# Patient Record
Sex: Female | Born: 1935
Health system: Southern US, Community
[De-identification: ages and names within clinical notes are randomized; demographics above are authoritative.]

## PROBLEM LIST (undated history)

## (undated) DIAGNOSIS — E539 Vitamin B deficiency, unspecified: Secondary | ICD-10-CM

## (undated) DIAGNOSIS — R262 Difficulty in walking, not elsewhere classified: Secondary | ICD-10-CM

## (undated) DIAGNOSIS — N390 Urinary tract infection, site not specified: Secondary | ICD-10-CM

## (undated) DIAGNOSIS — R41841 Cognitive communication deficit: Secondary | ICD-10-CM

## (undated) DIAGNOSIS — C50919 Malignant neoplasm of unspecified site of unspecified female breast: Secondary | ICD-10-CM

## (undated) DIAGNOSIS — E079 Disorder of thyroid, unspecified: Secondary | ICD-10-CM

## (undated) DIAGNOSIS — R21 Rash and other nonspecific skin eruption: Secondary | ICD-10-CM

## (undated) DIAGNOSIS — F0390 Unspecified dementia without behavioral disturbance: Secondary | ICD-10-CM

## (undated) DIAGNOSIS — W19XXXA Unspecified fall, initial encounter: Secondary | ICD-10-CM

## (undated) DIAGNOSIS — M6281 Muscle weakness (generalized): Secondary | ICD-10-CM

## (undated) HISTORY — DX: Difficulty in walking, not elsewhere classified: R26.2

## (undated) HISTORY — DX: Muscle weakness (generalized): M62.81

## (undated) HISTORY — PX: COLON SURGERY: SHX602

## (undated) HISTORY — DX: Cognitive communication deficit: R41.841

## (undated) HISTORY — DX: Urinary tract infection, site not specified: N39.0

## (undated) HISTORY — PX: MASTECTOMY, PARTIAL: SHX709

---

## 2001-08-19 ENCOUNTER — Ambulatory Visit (HOSPITAL_COMMUNITY): Admission: RE | Admit: 2001-08-19 | Discharge: 2001-08-19 | Payer: Self-pay | Admitting: Ophthalmology

## 2003-04-22 ENCOUNTER — Emergency Department (HOSPITAL_COMMUNITY): Admission: EM | Admit: 2003-04-22 | Discharge: 2003-04-22 | Payer: Self-pay | Admitting: Emergency Medicine

## 2004-10-05 ENCOUNTER — Ambulatory Visit (HOSPITAL_COMMUNITY): Admission: RE | Admit: 2004-10-05 | Discharge: 2004-10-05 | Payer: Self-pay | Admitting: Internal Medicine

## 2004-10-24 ENCOUNTER — Ambulatory Visit (HOSPITAL_COMMUNITY): Admission: RE | Admit: 2004-10-24 | Discharge: 2004-10-24 | Payer: Self-pay | Admitting: General Surgery

## 2004-10-25 ENCOUNTER — Inpatient Hospital Stay (HOSPITAL_COMMUNITY): Admission: RE | Admit: 2004-10-25 | Discharge: 2004-10-31 | Payer: Self-pay | Admitting: General Surgery

## 2005-10-09 ENCOUNTER — Ambulatory Visit (HOSPITAL_COMMUNITY): Admission: RE | Admit: 2005-10-09 | Discharge: 2005-10-09 | Payer: Self-pay | Admitting: Internal Medicine

## 2006-12-16 ENCOUNTER — Ambulatory Visit (HOSPITAL_COMMUNITY): Admission: RE | Admit: 2006-12-16 | Discharge: 2006-12-16 | Payer: Self-pay | Admitting: Internal Medicine

## 2006-12-27 ENCOUNTER — Encounter: Admission: RE | Admit: 2006-12-27 | Discharge: 2006-12-27 | Payer: Self-pay | Admitting: Internal Medicine

## 2007-01-03 ENCOUNTER — Encounter: Admission: RE | Admit: 2007-01-03 | Discharge: 2007-01-03 | Payer: Self-pay | Admitting: Internal Medicine

## 2007-01-03 ENCOUNTER — Encounter (INDEPENDENT_AMBULATORY_CARE_PROVIDER_SITE_OTHER): Payer: Self-pay | Admitting: Diagnostic Radiology

## 2007-01-03 ENCOUNTER — Encounter (INDEPENDENT_AMBULATORY_CARE_PROVIDER_SITE_OTHER): Payer: Self-pay | Admitting: Specialist

## 2007-01-17 ENCOUNTER — Encounter: Admission: RE | Admit: 2007-01-17 | Discharge: 2007-01-17 | Payer: Self-pay | Admitting: General Surgery

## 2007-01-27 ENCOUNTER — Inpatient Hospital Stay (HOSPITAL_COMMUNITY): Admission: RE | Admit: 2007-01-27 | Discharge: 2007-01-29 | Payer: Self-pay | Admitting: General Surgery

## 2007-01-27 ENCOUNTER — Encounter (INDEPENDENT_AMBULATORY_CARE_PROVIDER_SITE_OTHER): Payer: Self-pay | Admitting: *Deleted

## 2007-02-18 ENCOUNTER — Ambulatory Visit (HOSPITAL_COMMUNITY): Payer: Self-pay | Admitting: Oncology

## 2007-04-10 ENCOUNTER — Ambulatory Visit (HOSPITAL_COMMUNITY): Admission: RE | Admit: 2007-04-10 | Discharge: 2007-04-10 | Payer: Self-pay | Admitting: General Surgery

## 2007-11-26 ENCOUNTER — Encounter (HOSPITAL_COMMUNITY): Admission: RE | Admit: 2007-11-26 | Discharge: 2007-12-26 | Payer: Self-pay | Admitting: Oncology

## 2008-12-14 ENCOUNTER — Ambulatory Visit (HOSPITAL_COMMUNITY): Admission: RE | Admit: 2008-12-14 | Discharge: 2008-12-14 | Payer: Self-pay | Admitting: Obstetrics & Gynecology

## 2010-01-10 ENCOUNTER — Ambulatory Visit (HOSPITAL_COMMUNITY): Admission: RE | Admit: 2010-01-10 | Discharge: 2010-01-10 | Payer: Self-pay | Admitting: Internal Medicine

## 2010-04-27 ENCOUNTER — Other Ambulatory Visit: Admission: RE | Admit: 2010-04-27 | Discharge: 2010-04-27 | Payer: Self-pay | Admitting: Obstetrics & Gynecology

## 2011-02-20 ENCOUNTER — Other Ambulatory Visit (HOSPITAL_COMMUNITY): Payer: Self-pay | Admitting: Internal Medicine

## 2011-02-20 DIAGNOSIS — Z139 Encounter for screening, unspecified: Secondary | ICD-10-CM

## 2011-02-20 DIAGNOSIS — Z9010 Acquired absence of unspecified breast and nipple: Secondary | ICD-10-CM

## 2011-02-27 ENCOUNTER — Ambulatory Visit (HOSPITAL_COMMUNITY): Payer: Medicare Other

## 2011-03-01 ENCOUNTER — Ambulatory Visit (HOSPITAL_COMMUNITY): Payer: Medicare Other

## 2011-03-06 ENCOUNTER — Ambulatory Visit (HOSPITAL_COMMUNITY)
Admission: RE | Admit: 2011-03-06 | Discharge: 2011-03-06 | Disposition: A | Discharge status: Home / Self Care | Payer: Medicare Other | Source: Ambulatory Visit | Attending: Internal Medicine | Admitting: Internal Medicine

## 2011-03-06 DIAGNOSIS — Z139 Encounter for screening, unspecified: Secondary | ICD-10-CM

## 2011-03-06 DIAGNOSIS — Z9010 Acquired absence of unspecified breast and nipple: Secondary | ICD-10-CM

## 2011-03-06 DIAGNOSIS — Z1231 Encounter for screening mammogram for malignant neoplasm of breast: Secondary | ICD-10-CM | POA: Insufficient documentation

## 2011-03-06 NOTE — H&P (Signed)
NAME:  Cynthia [name], Cynthia [name]              ACCOUNT NO.:  [id number]   MEDICAL RECORD NO.:  [id number]          PATIENT TYPE:  AMB   LOCATION:  DAY                           FACILITY:  [facility]   PHYSICIAN:  Dalia Heading, M.D.  DATE OF BIRTH:  Sep 09, 1925   DATE OF ADMISSION:  DATE OF DISCHARGE:  LH                              HISTORY & PHYSICAL   CHIEF COMPLAINT:  History of colon neoplasm.   HISTORY OF PRESENT ILLNESS:  Patient is a 77 year old white female who  was referred for endoscopic evaluation.  She needs a colonoscopy for  followup of a colon neoplasm.  She had a benign neoplasm removed during  her right hemicolectomy in 2006.  No abdominal pain, weight loss,  nausea, vomiting, diarrhea, constipation, melena, or hematochezia have  been noted.  There is no family history of colon carcinoma.   Past medical history includes right breast cancer.   PAST SURGICAL HISTORY:  1. Right modified radical mastectomy in April, 2008.  2. Right hemicolectomy in 2006.   CURRENT MEDICATIONS:  None.   ALLERGIES:  No known drug allergies.   REVIEW OF SYSTEMS:  Noncontributory.   PHYSICAL EXAMINATION:  GENERAL:  Patient is a well-developed and well-  nourished white female in no acute distress.  LUNGS:  Clear to auscultation with equal breath sounds bilaterally.  HEART:  Regular rate and rhythm without S3, S4, or murmurs.  ABDOMEN:  Soft, nontender, nondistended.  No hepatosplenomegaly or  masses are noted.  RECTAL:  Deferred to the procedure.   IMPRESSION:  History of colon neoplasm.   PLAN:  Patient is scheduled for a colonoscopy on April 10, 2007.  The  risks and benefits of the procedure, including bleeding and perforation,  were fully explained to the patient, who gave informed consent.      Dalia Heading, M.D.  Electronically Signed     MAJ/MEDQ  D:  04/08/2007  T:  04/08/2007  Job:  [id number]   cc:   [facility]  Fax: [phone]   Madelin Rear. Sherwood Gambler, MD  Fax:  [phone]

## 2011-03-09 NOTE — H&P (Signed)
NAME:  Cynthia [name], Cynthia [name]              ACCOUNT NO.:  [id number]   MEDICAL RECORD NO.:  [id number]          PATIENT TYPE:  AMB   LOCATION:  DAY                           FACILITY:  [facility]   PHYSICIAN:  Dalia Heading, M.D.  DATE OF BIRTH:  1925-08-18   DATE OF ADMISSION:  10/24/2004  DATE OF DISCHARGE:  LH                                HISTORY & PHYSICAL   CHIEF COMPLAINT:  Right colon mass.   HISTORY OF PRESENT ILLNESS:  Patient is a 77 year old white female who today  underwent a colonoscopy and was found to have a large right colon mass in  the hepatic flexure of the transverse colon.  Biopsies were taken but the  mass was too large to be removed endoscopically.  The patient now is coming  to the operating room for right hemicolectomy.  She denies any abdominal  pain, weight loss, nausea, vomiting, diarrhea, constipation, melena, or  hematochezia.  She has never had a colonoscopy.   PAST MEDICAL HISTORY:  Unremarkable.   PAST SURGICAL HISTORY:  Unremarkable.   CURRENT MEDICATIONS:  None.   ALLERGIES:  No known drug allergies.   REVIEW OF SYSTEMS:  Noncontributory.   PHYSICAL EXAMINATION:  GENERAL:  Patient is a well-developed, well-nourished  white female in no acute distress.  VITAL SIGNS:  She is afebrile and vital signs are stable.  LUNGS:  Clear to auscultation with equal breath sounds bilaterally.  HEART:  Regular rate and rhythm without S3, S4, or murmurs.  ABDOMEN:  Soft, nontender, nondistended.  No hepatosplenomegaly or masses  are noted.  RECTAL:  Negative.   IMPRESSION:  Right colon mass.   PLAN:  The patient is scheduled for a right hemicolectomy on October 25, 2004.  The risks and benefits of the procedure including bleeding,  infection, cardiopulmonary difficulties, and the possibility of a  transfusion were fully explained to the patient, gave informed consent.     Mark   MAJ/MEDQ  D:  10/24/2004  T:  10/24/2004  Job:  [id number]   cc:   Madelin Rear. Sherwood Gambler,  MD  [address]  [place]  [place] [address]  Fax: [phone]   [facility]

## 2011-03-09 NOTE — H&P (Signed)
NAME:  Cynthia [name], Cynthia [name]              ACCOUNT NO.:  [id number]   MEDICAL RECORD NO.:  [id number]          PATIENT TYPE:  AMB   LOCATION:  DAY                           FACILITY:  [facility]   PHYSICIAN:  Dalia Heading, M.D.  DATE OF BIRTH:  07-Jul-1925   DATE OF ADMISSION:  DATE OF DISCHARGE:  LH                              HISTORY & PHYSICAL   AGE:  Is 77 year old.   CHIEF COMPLAINT:  Right breast carcinoma.   HISTORY OF PRESENT ILLNESS:  The patient is a 77 year old white female  who is referred for evaluation and treatment of a right breast cancer.  It was found on routine mammography and biopsy-proven to be ductal  carcinoma in situ.  No nipple discharge or family history of breast  carcinoma is noted.   PAST MEDICAL HISTORY:  Includes a benign right colon neoplasm.   PAST SURGICAL HISTORY:  Right hemicolectomy in 2006.   CURRENT MEDICATIONS:  None.   ALLERGIES:  NO KNOWN DRUG ALLERGIES.   REVIEW OF SYSTEMS:  Noncontributory.   PHYSICAL EXAMINATION:  GENERAL:  The patient is a well-developed, well-  nourished white female in no acute distress.  NECK:  Unremarkable.  No lymphadenopathy is noted.  LUNGS:  Clear to auscultation with equal breath sounds bilaterally.  HEART:  Reveals a regular rate and rhythm without S3, S4, or murmurs.  BREASTS:  The right breast reveals a mass with ecchymosis in the lower  outer quadrant.  No nipple discharge or dimpling is noted. The axilla is  negative for palpable nodes.  Left breast examination reveals no  dominant mass, nipple discharge, or dimpling.  The axilla is negative  for palpable nodes.   An MRI of the breast revealed no multifocal or contralateral disease.  The mass measured approximately 3 x 2.5 x 1.6-cm in size.   IMPRESSION:  Right breast ductal carcinoma in situ, high grade.   PLAN:  The patient is scheduled for a right modified radical mastectomy  on January 27, 2007.  Risks and benefits of the procedure including  bleeding,  infection, cardiopulmonary difficulties, and arm swelling were  fully explained to the patient, gave informed consent.      Dalia Heading, M.D.  Electronically Signed     MAJ/MEDQ  D:  01/23/2007  T:  01/23/2007  Job:  [id number]   cc:   [facility]  Fax: [phone]   Madelin Rear. Sherwood Gambler, MD  Fax: [phone]

## 2011-03-09 NOTE — Discharge Summary (Signed)
NAME:  Cynthia [name], Cynthia [name]              ACCOUNT NO.:  [id number]   MEDICAL RECORD NO.:  [id number]          PATIENT TYPE:  INP   LOCATION:  A330                          FACILITY:  [facility]   PHYSICIAN:  Dalia Heading, M.D.  DATE OF BIRTH:  [birth year]   DATE OF ADMISSION:  01/27/2007  DATE OF DISCHARGE:  04/09/2008LH                               DISCHARGE SUMMARY   HOSPITAL COURSE SUMMARY:  The patient is a 77 year old white female who  was found to have a right breast carcinoma.  She presented to [facility] for [facility].  She underwent a right modified radical mastectomy  on 01/27/2007.  She tolerated the procedure well.  Her postoperative  course has been unremarkable.  Her diet was advanced out difficulty.  She has not required any blood transfusion.  Her hematocrit at the time  of discharge was 37.  Final pathology is still pending.   The patient is being discharged home in good improving condition.   DISCHARGE INSTRUCTIONS:  The patient is to follow up Dr. Franky Macho on  02/04/2007.  She will be followed by home health nurse daily for wound  care and drainage of her Jackson-Pratt drain.   DISCHARGE MEDICATIONS:  None.   PRINCIPAL DIAGNOSIS:  Right breast carcinoma, ductal carcinoma in situ  preliminary, final pathology pending.   PRINCIPAL PROCEDURE:  Right modified radical mastectomy on 01/27/2007.      Dalia Heading, M.D.  Electronically Signed     MAJ/MEDQ  D:  01/29/2007  T:  01/29/2007  Job:  [id number]   cc:   Madelin Rear. Sherwood Gambler, MD  Fax: [phone]

## 2011-03-09 NOTE — Discharge Summary (Signed)
NAME:  Cynthia [name], Cynthia [name] NO.:  [id number]   MEDICAL RECORD NO.:  [id number]          PATIENT TYPE:  INP   LOCATION:  A314                          FACILITY:  [facility]   PHYSICIAN:  Dalia Heading, M.D.  DATE OF BIRTH:  17-Nov-1924   DATE OF ADMISSION:  10/25/2004  DATE OF DISCHARGE:  01/10/2006LH                                 DISCHARGE SUMMARY   HOSPITAL COURSE SUMMARY:  The patient is a 77 year old white female who was  found to have a neoplasm at the hepatic flexure of the colon.  This was  found on colonoscopy.  The patient presented to [facility] and  underwent a right hemicolectomy on October 25, 2004.  She tolerated the  procedure well.  Her postoperative course was for the most part  unremarkable.  Her diet was advanced without difficulty once her bowel  function returned.  Final pathology revealed a tubulovillous adenoma.  No  malignancy was seen.   The patient is being discharged home on October 31, 2004 in good, improving  condition.   DISCHARGE INSTRUCTIONS:  The patient is to follow up with Dr. Franky Macho  on November 02, 2004.   DISCHARGE MEDICATIONS:  Darvocet-N 100 with 1 tablet p.o. q.4h. p.r.n. pain.   Home health will assess the wound and ambulation.   PRINCIPAL DIAGNOSIS:  Tubulovillous adenoma.   PRINCIPAL PROCEDURE:  Right hemicolectomy on October 25, 2004.     Mark   MAJ/MEDQ  D:  10/31/2004  T:  10/31/2004  Job:  [id number]   cc:   Madelin Rear. Sherwood Gambler, MD  [address]  [place]  [place] [address]  Fax: [phone]

## 2011-03-09 NOTE — Op Note (Signed)
NAME:  Cynthia [name], Cynthia [name]              ACCOUNT NO.:  [id number]   MEDICAL RECORD NO.:  [id number]          PATIENT TYPE:  AMB   LOCATION:  DAY                           FACILITY:  [facility]   PHYSICIAN:  Dalia Heading, M.D.  DATE OF BIRTH:  1925/02/13   DATE OF PROCEDURE:  10/25/2004  DATE OF DISCHARGE:                                 OPERATIVE REPORT   PREOPERATIVE DIAGNOSIS:  Right colon mass, hepatic flexure.   POSTOPERATIVE DIAGNOSIS:  Right colon mass, hepatic flexure.   PROCEDURE:  Right hemicolectomy.   SURGEON:  Dr. Franky Macho   ANESTHESIA:  General endotracheal.   INDICATIONS:  The patient is a 77 year old white female who was found on  endoscopy to have a large villous adenoma at the hepatic flexure. Due to the  large nature of the adenoma, this could not be resected endoscopically.  The  patient comes to the operating room for right hemicolectomy.  The risks and  benefits of the procedure including bleeding, infection, cardiopulmonary  difficulties, possibility of malignancy were fully explained to the patient,  who gave informed consent.   PROCEDURE NOTE:  The patient was placed in supine position. After induction  of general endotracheal anesthesia, the abdomen was prepped and draped using  the usual sterile technique with Betadine. Surgical site confirmation was  performed.   A midline incision was made to the right of the umbilicus. The peritoneal  cavity was entered into without difficulty. The liver, gallbladder, stomach,  and proximal small intestine were all within normal limits. No abnormal  lesions were noted. The right colon was mobilized along the peritoneal  reflection. A GIA stapler was placed across the terminal ileum and fired.  This was likewise done to the proximal transverse colon. The incision was  proximal to the middle colic artery. The mesentery of the right colon was  then divided, ligated using an LDS stapler and 0 silk ligatures.  A side-to-  side ileocolic anastomosis was then performed using a GIA 70 stapler. The  enterotomy was closed using a TA-55 stapler. The mesentery was closed using  a 2-0 chromic gut running suture. The staple line was bolstered using 3-0  silk Lembert sutures. Omentum was placed over the anastomosis and secured in  place using 3-0 silk sutures. The abdominal cavity is then copiously  irrigated with normal saline.   The fascia was reapproximated using a looped 0 Novofil running suture. The  subcutaneous layer was irrigated normal saline and skin was closed using  staples. Betadine ointment and dry sterile dressings were applied.   All tape and needle counts were correct at the end of the procedure. The  patient was extubated in the operating room and went back to the recovery  room awake in stable condition.   COMPLICATIONS:  None.   SPECIMEN:  Right colon and terminal ileum.   BLOOD LOSS:  MinimalLoraine Leriche   MAJ/MEDQ  D:  10/25/2004  T:  10/25/2004  Job:  [id number]   cc:   Madelin Rear. Sherwood Gambler, MD  [address]  [place]  [place] [address]  Fax: [phone]

## 2011-03-09 NOTE — H&P (Signed)
NAME:  Cynthia [name], Cynthia [name]              ACCOUNT NO.:  [id number]   MEDICAL RECORD NO.:  [id number]          PATIENT TYPE:  OUT   LOCATION:                                FACILITY:  [facility]   PHYSICIAN:  Dalia Heading, M.D.  DATE OF BIRTH:  [birth year]   DATE OF ADMISSION:  DATE OF DISCHARGE:  LH                                HISTORY & PHYSICAL   CHIEF COMPLAINT:  Need for screening colonoscopy.   HISTORY OF PRESENT ILLNESS:  The patient is a 77 year old white female who  is referred for endoscopic evaluation.  She needs a screening colonoscopy.  She denies any abdominal pain, weight loss, nausea, vomiting, diarrhea,  constipation, melena, hematochezia.  She has never had a colonoscopy.  There  is no family history of colon carcinoma.   PAST MEDICAL HISTORY:  Unremarkable.   PAST SURGICAL HISTORY:  Unremarkable.   CURRENT MEDICATIONS:  None.   ALLERGIES:  No known drug allergies.   REVIEW OF SYSTEMS:  Noncontributory.   PHYSICAL EXAMINATION:  GENERAL:  The patient is a well-developed, well-  nourished, white female in no acute distress.  VITAL SIGNS:  She is afebrile and vital signs are stable.  LUNGS:  Clear to auscultation with good breath sounds bilaterally.  HEART:  Examination reveals regular rate and rhythm without S3, S4, or  murmurs.  ABDOMEN:  Soft, nontender, nondistended.  No hepatosplenomegaly or masses  are noted.  RECTAL:  Examination was deferred to the procedure.   IMPRESSION:  Need for screening colonoscopy.   PLAN:  The patient is scheduled for a colonoscopy on October 24, 2004.  The  risks and benefits of the procedure, including bleeding and perforation,  were fully explained to the patient, who gave informed consent.     Mark   MAJ/MEDQ  D:  10/10/2004  T:  10/10/2004  Job:  [id number]   cc:   Dalia Heading, M.D.  [address] [place]  [place] [address]  Fax: [phone]   Madelin Rear. Sherwood Gambler, MD  [address]  [place]  [place] [address]  Fax:  [phone]

## 2011-03-09 NOTE — Op Note (Signed)
NAME:  Cynthia [name], Cynthia [name]              ACCOUNT NO.:  [id number]   MEDICAL RECORD NO.:  [id number]          PATIENT TYPE:  INP   LOCATION:  A330                          FACILITY:  [facility]   PHYSICIAN:  Dalia Heading, M.D.  DATE OF BIRTH:  May 25, 1925   DATE OF PROCEDURE:  01/27/2007  DATE OF DISCHARGE:                               OPERATIVE REPORT   PREOPERATIVE DIAGNOSIS:  Right breast carcinoma.   POSTOPERATIVE DIAGNOSIS:  Right breast carcinoma.   PROCEDURE:  Right modified radical mastectomy.   SURGEON:  Dr. Franky Macho   ANESTHESIA:  General endotracheal.   INDICATIONS:  The patient is a 77 year old white female who has a biopsy-  proven ductal carcinoma in situ of the right breast.  There were high-  grade features associated with this.  After extensive discussion with  the patient, she has elected to proceed with a right modified radical  mastectomy.  Risks and benefits of the procedure including bleeding,  infection, cardiopulmonary difficulties, nerve injury, and pain were  fully explained to the patient, gave informed consent.   PROCEDURE NOTE:  The patient was placed in supine position.  After  induction of general endotracheal anesthesia, the abdomen was prepped  and draped in the usual sterile technique with Betadine.  Surgical site  confirmation was performed.   An elliptical incision was made medial lateral around the nipple.  This  also contained the previous biopsy incision site.  The superior flap was  then formed to the clavicle and inferior flap formed to the chest wall.  The breast along with its associated fascia was then freed away from the  chest wall medial lateral using Bovie electrocautery.  A level II  axillary dissection was then performed.  Care was taken to avoid the  thoracodorsal artery, vein, and nerve as well as the long thoracic  nerves.  Two palpable lymph nodes were noted within the axilla.  The  superior short suture was placed in the right  breast as well as a long  medial suture for orientation.  Specimen sent to pathology for further  examination.  A superior Jackson-Pratt drain was placed into the axilla  and inferior drain was placed along the flap.  Both were secured at skin  level using 3-0 nylon interrupted sutures.  The wound was irrigated with  normal saline.  Any bleeding was controlled using Bovie electrocautery.  The subcutaneous layer was reapproximated using 2-0 Vicryl interrupted  sutures.  The skin was closed using staples.  Betadine ointment and dry  sterile dressings were applied.   All tape and needle counts correct at the end of the procedure.  The  patient was extubated in the operating room and went back to recovery  room awake in stable condition.  Complications none.   SPECIMEN:  Right breast and axilla.   BLOOD LOSS:  Less than 100 mL.   DRAINS:  Superior drain, axilla and inferior drain, flap.      Dalia Heading, M.D.  Electronically Signed     MAJ/MEDQ  D:  01/27/2007  T:  01/27/2007  Job:  [id number]  cc:   Sherrilee Gilles. Gerarda Fraction, MD  Fax: [phone]

## 2012-03-28 ENCOUNTER — Other Ambulatory Visit (HOSPITAL_COMMUNITY): Payer: Self-pay | Admitting: Internal Medicine

## 2012-03-28 DIAGNOSIS — Z139 Encounter for screening, unspecified: Secondary | ICD-10-CM

## 2012-04-03 ENCOUNTER — Ambulatory Visit (HOSPITAL_COMMUNITY): Admission: RE | Admit: 2012-04-03 | Payer: Medicare Other | Source: Ambulatory Visit

## 2012-04-03 ENCOUNTER — Other Ambulatory Visit (HOSPITAL_COMMUNITY): Payer: Medicare Other

## 2012-04-04 ENCOUNTER — Ambulatory Visit (HOSPITAL_COMMUNITY)
Admission: RE | Admit: 2012-04-04 | Discharge: 2012-04-04 | Disposition: A | Discharge status: Home / Self Care | Payer: Medicare Other | Source: Ambulatory Visit | Attending: Internal Medicine | Admitting: Internal Medicine

## 2012-04-04 DIAGNOSIS — Z1231 Encounter for screening mammogram for malignant neoplasm of breast: Secondary | ICD-10-CM | POA: Insufficient documentation

## 2012-04-04 DIAGNOSIS — Z139 Encounter for screening, unspecified: Secondary | ICD-10-CM

## 2012-04-08 ENCOUNTER — Ambulatory Visit (HOSPITAL_COMMUNITY): Payer: Medicare Other

## 2013-12-08 ENCOUNTER — Inpatient Hospital Stay (HOSPITAL_COMMUNITY)
Admission: EM | Admit: 2013-12-08 | Discharge: 2013-12-10 | DRG: 552 | Disposition: A | Discharge status: Skilled Nursing Facility | Payer: Medicare Other | Attending: Internal Medicine | Admitting: Internal Medicine

## 2013-12-08 ENCOUNTER — Emergency Department (HOSPITAL_COMMUNITY): Payer: Medicare Other

## 2013-12-08 ENCOUNTER — Encounter (HOSPITAL_COMMUNITY): Payer: Self-pay | Admitting: Emergency Medicine

## 2013-12-08 DIAGNOSIS — F0390 Unspecified dementia without behavioral disturbance: Secondary | ICD-10-CM | POA: Diagnosis present

## 2013-12-08 DIAGNOSIS — N390 Urinary tract infection, site not specified: Secondary | ICD-10-CM | POA: Diagnosis present

## 2013-12-08 DIAGNOSIS — R55 Syncope and collapse: Secondary | ICD-10-CM

## 2013-12-08 DIAGNOSIS — S12000A Unspecified displaced fracture of first cervical vertebra, initial encounter for closed fracture: Secondary | ICD-10-CM | POA: Diagnosis present

## 2013-12-08 DIAGNOSIS — S1093XA Contusion of unspecified part of neck, initial encounter: Secondary | ICD-10-CM

## 2013-12-08 DIAGNOSIS — S0083XA Contusion of other part of head, initial encounter: Secondary | ICD-10-CM

## 2013-12-08 DIAGNOSIS — S12100A Unspecified displaced fracture of second cervical vertebra, initial encounter for closed fracture: Principal | ICD-10-CM | POA: Diagnosis present

## 2013-12-08 DIAGNOSIS — S0180XA Unspecified open wound of other part of head, initial encounter: Secondary | ICD-10-CM | POA: Diagnosis present

## 2013-12-08 DIAGNOSIS — W19XXXA Unspecified fall, initial encounter: Secondary | ICD-10-CM | POA: Diagnosis present

## 2013-12-08 DIAGNOSIS — Z853 Personal history of malignant neoplasm of breast: Secondary | ICD-10-CM

## 2013-12-08 DIAGNOSIS — Y92009 Unspecified place in unspecified non-institutional (private) residence as the place of occurrence of the external cause: Secondary | ICD-10-CM

## 2013-12-08 DIAGNOSIS — S0003XA Contusion of scalp, initial encounter: Secondary | ICD-10-CM | POA: Diagnosis present

## 2013-12-08 HISTORY — DX: Malignant neoplasm of unspecified site of unspecified female breast: C50.919

## 2013-12-08 HISTORY — DX: Unspecified dementia, unspecified severity, without behavioral disturbance, psychotic disturbance, mood disturbance, and anxiety: F03.90

## 2013-12-08 LAB — PROTIME-INR
INR: 0.98 (ref 0.00–1.49)
Prothrombin Time: 12.8 seconds (ref 11.6–15.2)

## 2013-12-08 LAB — BASIC METABOLIC PANEL
BUN: 20 mg/dL (ref 6–23)
CO2: 23 mEq/L (ref 19–32)
Calcium: 9.9 mg/dL (ref 8.4–10.5)
Chloride: 102 mEq/L (ref 96–112)
Creatinine, Ser: 1.07 mg/dL (ref 0.50–1.10)
GFR calc Af Amer: 53 mL/min — ABNORMAL LOW (ref >90)
GFR calc non Af Amer: 46 mL/min — ABNORMAL LOW (ref >90)
Glucose, Bld: 106 mg/dL — ABNORMAL HIGH (ref 70–99)
Potassium: 4.1 mEq/L (ref 3.7–5.3)
Sodium: 142 mEq/L (ref 137–147)

## 2013-12-08 LAB — CBC WITH DIFFERENTIAL/PLATELET
Basophils Absolute: 0 10*3/uL (ref 0.0–0.1)
Basophils Relative: 0 % (ref 0–1)
Eosinophils Absolute: 0 10*3/uL (ref 0.0–0.7)
Eosinophils Relative: 0 % (ref 0–5)
HCT: 45.9 % (ref 36.0–46.0)
Hemoglobin: 15.5 g/dL — ABNORMAL HIGH (ref 12.0–15.0)
Lymphocytes Relative: 12 % (ref 12–46)
Lymphs Abs: 1.3 10*3/uL (ref 0.7–4.0)
MCH: 32.1 pg (ref 26.0–34.0)
MCHC: 33.8 g/dL (ref 30.0–36.0)
MCV: 95 fL (ref 78.0–100.0)
Monocytes Absolute: 0.7 10*3/uL (ref 0.1–1.0)
Monocytes Relative: 6 % (ref 3–12)
Neutro Abs: 9 10*3/uL — ABNORMAL HIGH (ref 1.7–7.7)
Neutrophils Relative %: 81 % — ABNORMAL HIGH (ref 43–77)
Platelets: 242 10*3/uL (ref 150–400)
RBC: 4.83 MIL/uL (ref 3.87–5.11)
RDW: 14.1 % (ref 11.5–15.5)
WBC: 11.1 10*3/uL — ABNORMAL HIGH (ref 4.0–10.5)

## 2013-12-08 LAB — URINE MICROSCOPIC-ADD ON

## 2013-12-08 LAB — URINALYSIS, ROUTINE W REFLEX MICROSCOPIC
Bilirubin Urine: NEGATIVE
Glucose, UA: NEGATIVE mg/dL
Ketones, ur: 15 mg/dL — AB
Nitrite: POSITIVE — AB
Specific Gravity, Urine: 1.025 (ref 1.005–1.030)
Urobilinogen, UA: 0.2 mg/dL (ref 0.0–1.0)
pH: 5.5 (ref 5.0–8.0)

## 2013-12-08 LAB — TROPONIN I
Troponin I: <0.3 ng/mL (ref <0.30)
Troponin I: <0.3 ng/mL (ref <0.30)

## 2013-12-08 MED ORDER — DEXTROSE 5 % IV SOLN
INTRAVENOUS | Status: AC
  Filled 2013-12-08: qty 10

## 2013-12-08 MED ORDER — ONDANSETRON HCL 4 MG/2ML IJ SOLN: 4.0000 mg | Freq: Four times a day (QID) | INTRAMUSCULAR | Status: DC | PRN

## 2013-12-08 MED ORDER — FENTANYL CITRATE 0.05 MG/ML IJ SOLN
50.0000 ug | Freq: Once | INTRAMUSCULAR | Status: AC
  Administered 2013-12-08: 50 ug via INTRAVENOUS
  Filled 2013-12-08: qty 2

## 2013-12-08 MED ORDER — LIDOCAINE HCL (PF) 1 % IJ SOLN
INTRAMUSCULAR | Status: AC
  Filled 2013-12-08: qty 5

## 2013-12-08 MED ORDER — HYDROCODONE-ACETAMINOPHEN 5-325 MG PO TABS
1.0000 | ORAL_TABLET | ORAL | Status: DC | PRN
  Administered 2013-12-09: 1 via ORAL
  Filled 2013-12-08: qty 1

## 2013-12-08 MED ORDER — LIDOCAINE HCL (PF) 1 % IJ SOLN: 5.0000 mL | Freq: Once | INTRAMUSCULAR | Status: DC

## 2013-12-08 MED ORDER — BACITRACIN ZINC 500 UNIT/GM EX OINT
TOPICAL_OINTMENT | CUTANEOUS | Status: AC
  Filled 2013-12-08: qty 0.9

## 2013-12-08 MED ORDER — CEFTRIAXONE SODIUM 1 G IJ SOLR
1.0000 g | INTRAMUSCULAR | Status: DC
  Administered 2013-12-09 (×2): 1 g via INTRAVENOUS
  Filled 2013-12-08 (×3): qty 10

## 2013-12-08 MED ORDER — ONDANSETRON HCL 4 MG PO TABS: 4.0000 mg | ORAL_TABLET | Freq: Four times a day (QID) | ORAL | Status: DC | PRN

## 2013-12-08 NOTE — ED Notes (Signed)
Spoke with daughter, Jinx Gilden, [phone], in [place]

## 2013-12-08 NOTE — ED Provider Notes (Signed)
1900  I was asked by EDP, Dr.C Horton, to repair a laceration to the right mid forehead.  This was my only involvement in this patient's care.  LACERATION REPAIR Performed by: Gaje Tennyson L. Authorized by: Hale Bogus Consent: Verbal consent obtained. Risks and benefits: risks, benefits and alternatives were discussed Consent given by: patient Patient identity confirmed: provided demographic data Prepped and Draped in normal sterile fashion Wound explored  Laceration Location: right forehead Laceration Length: 2 cm  No Foreign Bodies seen or palpated  Anesthesia: local infiltration  Local anesthetic: lidocaine 1% w/o epinephrine  Anesthetic total: 2 ml  Irrigation method: syringe Amount of cleaning: standard  Skin closure: 5-0 prolene  Number of sutures: 4  Technique: simple interrupted  Patient tolerance: Patient tolerated the procedure well with no immediate complications.     Macil Crady L. Vanessa Dermott, PA-C 12/08/13 2014

## 2013-12-08 NOTE — H&P (Signed)
PCP:   Glo Herring., MD   Chief Complaint:  Fall  HPI:  80 year old female who  has a past medical history of Breast cancer and Dementia. today was brought to the ED him him by ambulance after patient was found by plumber in her carport with a hematoma to the forehead and laceration. Patient has a history of dementia and is not sure whether she fell or passed out. She does not have any collection of the event. At this time patient is back to her baseline she is alert and oriented x3, she denies history of syncope, no recent falls at home, she denies chest pain or shortness of breath, no recent nausea vomiting or diarrhea no fever no runny nose or sore throat. In the ED patient was found to have C2 cervical fracture with epidural hematoma, C1 fracture. Cardiac enzymes are negative, EKG shows no significant arrhythmia. Patient has a laceration to the forehead which is being repaired by the ED physician. Patient lives by herself, her daughter lives in [place]. Neurosurgery Dr. Joya Salm was consulted by the ED physician who reviewed the films and recommended nonoperative management.  Allergies:  No Known Allergies    Past Medical History  Diagnosis Date  . Breast cancer   . Dementia     Past Surgical History  Procedure Laterality Date  . Colon surgery    . Mastectomy, partial Right     Prior to Admission medications   Not on File    Social History:  reports that she has never smoked. She does not have any smokeless tobacco history on file. She reports that she does not drink alcohol or use illicit drugs.    All the positives are listed in BOLD  Review of Systems:  HEENT: Headache, blurred vision, runny nose, sore throat Neck: Hypothyroidism, hyperthyroidism,,lymphadenopathy Chest : Shortness of breath, history of COPD, Asthma Heart : Chest pain, history of coronary arterey disease GI:  Nausea, vomiting, diarrhea, constipation, GERD GU: Dysuria, urgency, frequency of  urination, hematuria Neuro: Stroke, seizures, syncope Psych: Depression, anxiety, hallucinations   Physical Exam: Blood pressure 144/62, pulse 76, temperature 97.4 F (36.3 C), temperature source Oral, resp. rate 23, height '5\' 3"'  (1.6 m), weight 65.772 kg (145 lb), SpO2 98.00%. Constitutional:   Patient is a well-developed and well-nourished female,  in no acute distress and cooperative with exam. Head: Normocephalic and atraumatic Mouth: Mucus membranes moist Eyes: PERRL, EOMI, conjunctivae normal Neck: Aspen collar in place  Cardiovascular: RRR, S1 normal, S2 normal Pulmonary/Chest: CTAB, no wheezes, rales, or rhonchi Abdominal: Soft. Non-tender, non-distended, bowel sounds are normal, no masses, organomegaly, or guarding present.  Neurological: A&O x3, Strenght is normal and symmetric bilaterally, cranial nerve II-XII are grossly intact, no focal motor deficit, sensory intact to light touch bilaterally.  Extremities : No Cyanosis, Clubbing or Edema   Labs on Admission:  Results for orders placed during the hospital encounter of 12/08/13 (from the past 48 hour(s))  CBC WITH DIFFERENTIAL     Status: Abnormal   Collection Time    12/08/13  4:54 PM      Result Value Ref Range   WBC 11.1 (*) 4.0 - 10.5 K/uL   RBC 4.83  3.87 - 5.11 MIL/uL   Hemoglobin 15.5 (*) 12.0 - 15.0 g/dL   HCT 45.9  36.0 - 46.0 %   MCV 95.0  78.0 - 100.0 fL   MCH 32.1  26.0 - 34.0 pg   MCHC 33.8  30.0 - 36.0 g/dL  RDW 14.1  11.5 - 15.5 %   Platelets 242  150 - 400 K/uL   Neutrophils Relative % 81 (*) 43 - 77 %   Neutro Abs 9.0 (*) 1.7 - 7.7 K/uL   Lymphocytes Relative 12  12 - 46 %   Lymphs Abs 1.3  0.7 - 4.0 K/uL   Monocytes Relative 6  3 - 12 %   Monocytes Absolute 0.7  0.1 - 1.0 K/uL   Eosinophils Relative 0  0 - 5 %   Eosinophils Absolute 0.0  0.0 - 0.7 K/uL   Basophils Relative 0  0 - 1 %   Basophils Absolute 0.0  0.0 - 0.1 K/uL  BASIC METABOLIC PANEL     Status: Abnormal   Collection Time     12/08/13  4:54 PM      Result Value Ref Range   Sodium 142  137 - 147 mEq/L   Potassium 4.1  3.7 - 5.3 mEq/L   Chloride 102  96 - 112 mEq/L   CO2 23  19 - 32 mEq/L   Glucose, Bld 106 (*) 70 - 99 mg/dL   BUN 20  6 - 23 mg/dL   Creatinine, Ser 1.07  0.50 - 1.10 mg/dL   Calcium 9.9  8.4 - 10.5 mg/dL   GFR calc non Af Amer 46 (*) >90 mL/min   GFR calc Af Amer 53 (*) >90 mL/min   Comment: (NOTE)     The eGFR has been calculated using the CKD EPI equation.     This calculation has not been validated in all clinical situations.     eGFR's persistently <90 mL/min signify possible Chronic Kidney     Disease.  PROTIME-INR     Status: None   Collection Time    12/08/13  4:54 PM      Result Value Ref Range   Prothrombin Time 12.8  11.6 - 15.2 seconds   INR 0.98  0.00 - 1.49  TROPONIN I     Status: None   Collection Time    12/08/13  4:54 PM      Result Value Ref Range   Troponin I <0.30  <0.30 ng/mL   Comment:            Due to the release kinetics of cTnI,     a negative result within the first hours     of the onset of symptoms does not rule out     myocardial infarction with certainty.     If myocardial infarction is still suspected,     repeat the test at appropriate intervals.  URINALYSIS, ROUTINE W REFLEX MICROSCOPIC     Status: Abnormal   Collection Time    12/08/13  6:48 PM      Result Value Ref Range   Color, Urine YELLOW  YELLOW   APPearance CLEAR  CLEAR   Specific Gravity, Urine 1.025  1.005 - 1.030   pH 5.5  5.0 - 8.0   Glucose, UA NEGATIVE  NEGATIVE mg/dL   Hgb urine dipstick TRACE (*) NEGATIVE   Bilirubin Urine NEGATIVE  NEGATIVE   Ketones, ur 15 (*) NEGATIVE mg/dL   Protein, ur TRACE (*) NEGATIVE mg/dL   Urobilinogen, UA 0.2  0.0 - 1.0 mg/dL   Nitrite POSITIVE (*) NEGATIVE   Leukocytes, UA SMALL (*) NEGATIVE  URINE MICROSCOPIC-ADD ON     Status: Abnormal   Collection Time    12/08/13  6:48 PM      Result  Value Ref Range   Squamous Epithelial / LPF FEW (*)  RARE   WBC, UA 21-50  <3 WBC/hpf   RBC / HPF 0-2  <3 RBC/hpf   Bacteria, UA MANY (*) RARE    Radiological Exams on Admission: Ct Head Wo Contrast  12/08/2013   CLINICAL DATA:  Head trauma secondary to a fall today. Altered mental status. Forehead laceration.  EXAM: CT HEAD WITHOUT CONTRAST  TECHNIQUE: Contiguous axial images were obtained from the base of the skull through the vertex without intravenous contrast.  COMPARISON:  None.  FINDINGS: No mass lesion. No midline shift. No acute hemorrhage or hematoma. No extra-axial fluid collections. No evidence of acute infarction. There is diffuse cerebral cortical and cerebellar atrophy with secondary ventricular dilatation.  No acute osseous abnormality. There is a scalp laceration and small scalp hematoma over the right frontal bone.  IMPRESSION: 1. No acute intracranial abnormality. 2. Scalp hematoma and laceration over the right frontal bone.   Electronically Signed   By: Rozetta Nunnery M.D.   On: 12/08/2013 18:10   Ct Cervical Spine Wo Contrast  12/08/2013   CLINICAL DATA:  Head trauma secondary to a fall. Altered mental status. Dementia.  EXAM: CT CERVICAL SPINE WITHOUT CONTRAST  TECHNIQUE: Multidetector CT imaging of the cervical spine was performed without intravenous contrast. Multiplanar CT image reconstructions were also generated.  COMPARISON:  None.  FINDINGS: There is an oblique vertical fracture through the left lateral mass of C2. There is also a fracture through the anterior aspect of the right pedicle of C2. There is an epidural hematoma at the C1-2 level to the left of midline extending anterior and posterior to the spinal cord with compression of the thecal sac and minimal compression of the spinal cord at the C2-3 level. This hematoma extends along the left lamina of C3 to the level of the low C3-4 disc space. The anterior component of the epidural hematoma extends to the C5-6 level but this is only 3 mm in AP dimension.  There is also a  slightly displaced fracture of left side of the posterior arch of C1. I cannot identify a second fracture of the ring of C1.  The patient has auto fusion of the C3-4 level. There is degenerative disc space narrowing at C4-5 through C6-7 with multilevel moderate facet arthritis from C2-3 through T2-3 on the right and at C2-3, C4-5, and T1-2 on the left.  There is no subluxation.  IMPRESSION: 1. Fractures of the left lateral mass and right pedicle of C2 with anterior and left lateral and left posterior lateral epidural hematoma with a slight mass effect upon the thecal sac and spinal cord at C2. No subluxation at the site of the fractures. 2. Single visible fracture of the left side of the posterior arch of C1.   Electronically Signed   By: Rozetta Nunnery M.D.   On: 12/08/2013 18:23   Dg Pelvis Portable  12/08/2013   CLINICAL DATA:  History of fall.  Pelvic tenderness.  EXAM: PORTABLE PELVIS 1-2 VIEWS  COMPARISON:  No priors.  FINDINGS: Single AP view of the pelvis demonstrates no definite acute displaced fractures of the bony pelvic ring. Bilateral proximal femurs as visualized appear intact, the femoral heads project over the acetabula bilaterally on this single view examination. Numerous surgical clips are noted throughout the abdominal and pelvic regions.  IMPRESSION: 1. Single view examination demonstrates no definite acute traumatic injury to the bony pelvis.   Electronically Signed   By:  Vinnie Langton M.D.   On: 12/08/2013 17:28   Dg Chest Portable 1 View  12/08/2013   CLINICAL DATA:  History of trauma from a fall. Cervical spine fracture.  EXAM: PORTABLE CHEST - 1 VIEW  COMPARISON:  No priors.  FINDINGS: Lung volumes are normal. No consolidative airspace disease. No pleural effusions. No evidence of pulmonary edema. No pneumothorax. No suspicious appearing pulmonary nodules or masses. Heart size is normal. Mediastinal contours are unremarkable. Atherosclerosis in the thoracic aorta.  IMPRESSION: 1. No  radiographic evidence of acute cardiopulmonary disease. 2. Atherosclerosis.   Electronically Signed   By: Vinnie Langton M.D.   On: 12/08/2013 19:13    Assessment/Plan Principal Problem:   C2 cervical fracture Active Problems:   Syncope UTI   Fall,  ? Syncope, will admit the patient under telemetry. Obtain 2-D echocardiogram and follow serial cardiac enzymes. Patient may require a Holter monitor as outpatient. Will get PT OT consult  C2 cervical spine fracture Neurosurgery was consulted by the ED physician, films were reviewed by the neurosurgeon Dr. Joya Salm, and he recommended nonoperative management with aspirin collar to be placed for 4 weeks and followup as outpatient. Patient lives by herself, may require short-term rehabilitation versus home health.  UTI Patient was found to have abnormal UA, will start her on assessment and follow the urine culture results.  H/o Dementia Stable.  DVT prophylaxis SCDs  Code status: full code  Family discussion: called and discussed with patient's daughter Barnetta Chapel, [phone]. She lives in [place]. She will try to come to [place] on Thursday.   Time Spent on Admission:  65 minutes  [facility] Pager: [phone] 12/08/2013, 7:37 PM  If 7PM-7AM, please contact night-coverage  [email or web address]  Password [email or web address]

## 2013-12-08 NOTE — ED Notes (Signed)
CBG 126 per ems.

## 2013-12-08 NOTE — ED Notes (Signed)
Aspen collar applied by RN and Dr Dina Rich with meticulous in-line stabilization maintained, no change in pt's neuro function after collar applied, pt continues to move all 4 well with good grips and good leg strength

## 2013-12-08 NOTE — ED Provider Notes (Signed)
Medical screening examination/treatment/procedure(s) were conducted as a shared visit with non-physician practitioner(s) and myself.  I personally evaluated the patient during the encounter.      Merryl Hacker, MD 12/08/13 2116

## 2013-12-08 NOTE — ED Provider Notes (Signed)
CSN: [id number]     Arrival date & time 12/08/13  1624 History   First MD Initiated Contact with Patient 12/08/13 1624     Chief Complaint  Patient presents with  . Fall     (Consider location/radiation/quality/duration/timing/severity/associated sxs/prior Treatment) Patient is a 80 y.o. female presenting with fall.  Fall    This is an 80 yo female with history of breast cancer and dementia who presents following a fall. Patient has no recollections of events. Per EMS, patient was found by a plumber in her carport with a notable hematoma to the forehead and laceration.  Patient is unsure whether she fell or passed out. She is alert person and place but not time. She denies any anticoagulant use. She reports that she has been ambulatory.  I spoke with patient's daughter. Patient has baseline dementia and lives by herself. They have been trying to get help at home but the patient has been resistant. She confirms history of breast cancer and dementia without any other notable history.  Level V caveat secondary to mental status  Past Medical History  Diagnosis Date  . Breast cancer   . Dementia    Past Surgical History  Procedure Laterality Date  . Colon surgery    . Mastectomy, partial Right    No family history on file. History  Substance Use Topics  . Smoking status: Never Smoker   . Smokeless tobacco: Not on file  . Alcohol Use: No   OB History   Grav Para Term Preterm Abortions TAB SAB Ect Mult Living                 Review of Systems  Unable to perform ROS: Mental status change      Allergies  Review of patient's allergies indicates no known allergies.  Home Medications  No current outpatient prescriptions on file. BP 144/62  Pulse 76  Temp(Src) 97.4 F (36.3 C) (Oral)  Resp 23  Ht 5\' 3"  (1.6 m)  Wt 145 lb (65.772 kg)  BMI 25.69 kg/m2  SpO2 98% Physical Exam  Nursing note and vitals reviewed. Constitutional:  Elderly, boarded and collared, no acute  distress  HENT:  Head: Normocephalic.  Right Ear: External ear normal.  Left Ear: External ear normal.  Large hematoma of the right for head with 3-4 cm laceration oozing  Eyes: Pupils are equal, round, and reactive to light.  Neck: Neck supple.  C. collar in place  Cardiovascular: Normal rate, regular rhythm and normal heart sounds.   Pulmonary/Chest: Effort normal and breath sounds normal. No respiratory distress. She has no wheezes.  Bilateral mastectomy noted  Abdominal: Soft. Bowel sounds are normal. There is no tenderness.  Musculoskeletal: Normal range of motion.  Normal range of motion of bilateral hips and bilateral knees. No contusion or abrasion noted  Neurological: She is alert.  Oriented x2, follows commands, moves all 4 extremities, 5/5 grip strength   Skin: Skin is warm and dry.  Hematoma and laceration as noted above  Psychiatric: She has a normal mood and affect.    ED Course  Procedures (including critical care time) Labs Review Labs Reviewed  CBC WITH DIFFERENTIAL - Abnormal; Notable for the following:    WBC 11.1 (*)    Hemoglobin 15.5 (*)    Neutrophils Relative % 81 (*)    Neutro Abs 9.0 (*)    All other components within normal limits  BASIC METABOLIC PANEL - Abnormal; Notable for the following:    Glucose,  Bld 106 (*)    GFR calc non Af Amer 46 (*)    GFR calc Af Amer 53 (*)    All other components within normal limits  PROTIME-INR  TROPONIN I  URINALYSIS, ROUTINE W REFLEX MICROSCOPIC   Imaging Review Ct Head Wo Contrast  12/08/2013   CLINICAL DATA:  Head trauma secondary to a fall today. Altered mental status. Forehead laceration.  EXAM: CT HEAD WITHOUT CONTRAST  TECHNIQUE: Contiguous axial images were obtained from the base of the skull through the vertex without intravenous contrast.  COMPARISON:  None.  FINDINGS: No mass lesion. No midline shift. No acute hemorrhage or hematoma. No extra-axial fluid collections. No evidence of acute infarction.  There is diffuse cerebral cortical and cerebellar atrophy with secondary ventricular dilatation.  No acute osseous abnormality. There is a scalp laceration and small scalp hematoma over the right frontal bone.  IMPRESSION: 1. No acute intracranial abnormality. 2. Scalp hematoma and laceration over the right frontal bone.   Electronically Signed   By: Rozetta Nunnery M.D.   On: 12/08/2013 18:10   Ct Cervical Spine Wo Contrast  12/08/2013   CLINICAL DATA:  Head trauma secondary to a fall. Altered mental status. Dementia.  EXAM: CT CERVICAL SPINE WITHOUT CONTRAST  TECHNIQUE: Multidetector CT imaging of the cervical spine was performed without intravenous contrast. Multiplanar CT image reconstructions were also generated.  COMPARISON:  None.  FINDINGS: There is an oblique vertical fracture through the left lateral mass of C2. There is also a fracture through the anterior aspect of the right pedicle of C2. There is an epidural hematoma at the C1-2 level to the left of midline extending anterior and posterior to the spinal cord with compression of the thecal sac and minimal compression of the spinal cord at the C2-3 level. This hematoma extends along the left lamina of C3 to the level of the low C3-4 disc space. The anterior component of the epidural hematoma extends to the C5-6 level but this is only 3 mm in AP dimension.  There is also a slightly displaced fracture of left side of the posterior arch of C1. I cannot identify a second fracture of the ring of C1.  The patient has auto fusion of the C3-4 level. There is degenerative disc space narrowing at C4-5 through C6-7 with multilevel moderate facet arthritis from C2-3 through T2-3 on the right and at C2-3, C4-5, and T1-2 on the left.  There is no subluxation.  IMPRESSION: 1. Fractures of the left lateral mass and right pedicle of C2 with anterior and left lateral and left posterior lateral epidural hematoma with a slight mass effect upon the thecal sac and spinal cord  at C2. No subluxation at the site of the fractures. 2. Single visible fracture of the left side of the posterior arch of C1.   Electronically Signed   By: Rozetta Nunnery M.D.   On: 12/08/2013 18:23   Dg Pelvis Portable  12/08/2013   CLINICAL DATA:  History of fall.  Pelvic tenderness.  EXAM: PORTABLE PELVIS 1-2 VIEWS  COMPARISON:  No priors.  FINDINGS: Single AP view of the pelvis demonstrates no definite acute displaced fractures of the bony pelvic ring. Bilateral proximal femurs as visualized appear intact, the femoral heads project over the acetabula bilaterally on this single view examination. Numerous surgical clips are noted throughout the abdominal and pelvic regions.  IMPRESSION: 1. Single view examination demonstrates no definite acute traumatic injury to the bony pelvis.   Electronically Signed  By: Vinnie Langton M.D.   On: 12/08/2013 17:28    EKG Interpretation    Date/Time:  Tuesday December 08 2013 17:22:14 EST Ventricular Rate:  70 PR Interval:  140 QRS Duration: 78 QT Interval:  424 QTC Calculation: 457 R Axis:   78 Text Interpretation:  Normal sinus rhythm Normal ECG When compared with ECG of 24-Jan-2007 10:47, Criteria for Septal infarct are no longer Present Confirmed by Spenser Cong  MD, Loma Sousa ([id number]) on 12/08/2013 5:51:58 PM            MDM   Final diagnoses:  C2 cervical fracture  Fall    Patient presents following a fall.  Syncopal workup initiated as Circumstances surrounding the fall are unclear. She has baseline dementia and lives by herself. She was boarded and collared in route. Physical exam is notable for hematoma laceration of the forehead. She has a nonfocal neurologic exam. CT scan head and C-spine were performed and showed C2 fracture with epidural hematoma and C1 fracture. Patient was placed in an Aspen collar. I spoke with Dr. Joya Salm with neurosurgery he states that these injuries are nonoperative. Maintain aspen collar and follow-up in 3-4  weeks.  Urinalysis is pending. EKG is nonischemic and without evidence of arrhythmia. Laceration repaired at the bedside. Patient is by herself and will need admission for physical therapy and occupational therapy evaluations.  To be admitted by Dr. Darrick Meigs.    Merryl Hacker, MD 12/08/13 [phone]

## 2013-12-08 NOTE — ED Notes (Signed)
EMS reports pt had called a plumber to come to her house.  When the plumber arrived he found pt sitting on her steps on her car port with laceration in center of forehead, and eye glasses broken.  Pt says doesn't remember falling and doesn't know why she was outside.  Pt alert but drowsy, c/o headache.

## 2013-12-09 DIAGNOSIS — R55 Syncope and collapse: Secondary | ICD-10-CM

## 2013-12-09 DIAGNOSIS — I319 Disease of pericardium, unspecified: Secondary | ICD-10-CM

## 2013-12-09 LAB — CBC
HCT: 40.1 % (ref 36.0–46.0)
Hemoglobin: 13.5 g/dL (ref 12.0–15.0)
MCH: 31.8 pg (ref 26.0–34.0)
MCHC: 33.7 g/dL (ref 30.0–36.0)
MCV: 94.4 fL (ref 78.0–100.0)
Platelets: 240 10*3/uL (ref 150–400)
RBC: 4.25 MIL/uL (ref 3.87–5.11)
RDW: 14.5 % (ref 11.5–15.5)
WBC: 9.2 10*3/uL (ref 4.0–10.5)

## 2013-12-09 LAB — COMPREHENSIVE METABOLIC PANEL
ALT: 6 U/L (ref 0–35)
AST: 18 U/L (ref 0–37)
Albumin: 3.4 g/dL — ABNORMAL LOW (ref 3.5–5.2)
Alkaline Phosphatase: 67 U/L (ref 39–117)
BUN: 18 mg/dL (ref 6–23)
CO2: 21 mEq/L (ref 19–32)
Calcium: 8.9 mg/dL (ref 8.4–10.5)
Chloride: 106 mEq/L (ref 96–112)
Creatinine, Ser: 0.86 mg/dL (ref 0.50–1.10)
GFR calc Af Amer: 69 mL/min — ABNORMAL LOW (ref >90)
GFR calc non Af Amer: 59 mL/min — ABNORMAL LOW (ref >90)
Glucose, Bld: 95 mg/dL (ref 70–99)
Potassium: 4.1 mEq/L (ref 3.7–5.3)
Sodium: 143 mEq/L (ref 137–147)
Total Bilirubin: 0.4 mg/dL (ref 0.3–1.2)
Total Protein: 6.8 g/dL (ref 6.0–8.3)

## 2013-12-09 LAB — TROPONIN I
Troponin I: <0.3 ng/mL (ref <0.30)
Troponin I: <0.3 ng/mL (ref <0.30)

## 2013-12-09 MED ORDER — ACETAMINOPHEN 325 MG PO TABS
650.0000 mg | ORAL_TABLET | Freq: Four times a day (QID) | ORAL | Status: DC | PRN
  Administered 2013-12-09: 650 mg via ORAL
  Filled 2013-12-09: qty 2

## 2013-12-09 MED ORDER — LORAZEPAM 2 MG/ML IJ SOLN
0.5000 mg | Freq: Once | INTRAMUSCULAR | Status: AC
  Administered 2013-12-09: 0.5 mg via INTRAVENOUS
  Filled 2013-12-09: qty 1

## 2013-12-09 MED ORDER — SODIUM CHLORIDE 0.9 % IV SOLN
INTRAVENOUS | Status: AC
  Administered 2013-12-09: 20:00:00 via INTRAVENOUS

## 2013-12-09 NOTE — Progress Notes (Signed)
UR chart review completed.  

## 2013-12-09 NOTE — Progress Notes (Signed)
Patient was interviewed and examined. He discussed with patient's female friend at bedside. Reviewed Mr. Dyanne Carrel, NP progress notes for today-following editorial changes made and agree with rest. Reviewed chart including labs and radiology in detail.  As per patient and friend at bedside, lives alone, mostly independent-has a walker but rarely uses it. She cannot recall the events leading to the fall in her car port. States that she does not think that she passed out. Denies history of preceding chest pain, palpitation, dizziness or lightheadedness. Her friend states that the area generally is slippery. Currently denies pain. Denies pain in the neck.  Patient sustained a fall but it is not clear whether she truly had a syncopal episode. Her cervical spine fracture management was discussed by Ms. Black with the neurosurgeon Dr. Joya Salm who recommended that she has to keep the Aspen cervical collar for 4 weeks and followup with him as outpatient with repeat imaging. Patient has reluctantly agreed to SNF placement. Monitor clinically and closely.  2-D echo: Study Conclusions  - Study data: Technically difficult study. - Left ventricle: The cavity size was normal. Wall thickness was normal. Systolic function was vigorous. The estimated ejection fraction is >70%. There is a mild midcavitary gradient caused by systolic cavity obliteration. Wall motion was normal; there were no regional wall motion abnormalities. Doppler parameters are consistent with abnormal left ventricular relaxation (grade 1 diastolic dysfunction). - Aortic valve: Mildly calcified annulus. Trileaflet; mildly thickened leaflets. - Mitral valve: Mildly calcified annulus. Mildly thickened leaflets . - Atrial septum: The is an atrial septal aneurysm that is mobile, projecting back and forth between the left and right atirum. There is no clear evidence of interatrial shunting by color Doppler. - Systemic veins: The IVC is small  consistent with low RA pressures and hypovolemia. - Pericardium, extracardiac: There is a small pericardial effusion adjacent to the RV free wall.  Will place on telemetry to rule out arrhythmias. Brief IV fluid hydration for hypovolemia per 2-D echo.  Discussed with patient's friend at bedside.  Vernell Leep, MD, FACP, FHM. [facility] Pager [phone]  If 7PM-7AM, please contact night-coverage [email or web address] Password [facility] 12/09/2013, 2:47 PM

## 2013-12-09 NOTE — Clinical Social Work Note (Addendum)
CSW spoke with pt again after discussion between NP and pt. Pt reluctantly agreed to consider SNF in [place] only. CSW will initiate bed search and follow up tomorrow with pt. Spoke to daughter who reports she feels her mother is afraid that if she goes to SNF, that will be the end of her independence. Daughter indicates that she is unable to see pt except for holidays and the summer due to her work schedule. They talk weekly. Daughter feels strongly that pt needs to go to rehab. Explained process.   Benay Pike, [place]

## 2013-12-09 NOTE — Progress Notes (Signed)
[facility] PROGRESS NOTE  Cynthia [name] NOB:[id number] DOB: 12-14-1924 DOA: 12/08/2013 PCP: Glo Herring., MD  Assessment/Plan: Fall,  ? Syncope. No events on tele. Await 2-D echocardiogram results. cardiac enzymes negative x3. Patient may require a Holter monitor as outpatient. Await PT OT consult   C2 cervical spine fracture  Neurosurgery consulted by the ED physician, films were reviewed by the neurosurgeon Dr. Joya Salm, and he recommended nonoperative management with Aspen collar for 4 weeks and followup as outpatient in 4 weeks and will repeat imaging at that time. Marland Kitchen  UTI  Patient was found to have abnormal UA. Rocephin day #2. Afebrile and non-toxic appearing. H/o Dementia  Appears stable at baseline    Code Status: full Family Communication: daughter by phone (left message) Disposition Plan: may need short term snf   Consultants:  Dr. Joya Salm.   Procedures:  none  Antibiotics:  Rocephin 12/08/13>>  HPI/Subjective: Awake alert oriented to self and place eating breakfast. Denies pain  Objective: Filed Vitals:   12/09/13 0430  BP: 97/59  Pulse: 84  Temp: 98.7 F (37.1 C)  Resp: 12    Intake/Output Summary (Last 24 hours) at 12/09/13 1147 Last data filed at 12/09/13 0013  Gross per 24 hour  Intake     50 ml  Output      0 ml  Net     50 ml   Filed Weights   12/08/13 1640 12/08/13 2006  Weight: 65.772 kg (145 lb) 58.06 kg (128 lb)    Exam:   General:  Well nourished appears comfortable  Cardiovascular: RRR No MGR No LE edema  Respiratory: normal effort BS clear bilaterally no wheeze  Abdomen: soft +BS non-tender to palpation  Musculoskeletal: aspen collar intact. UR strength 5/5 bilaterally    Data Reviewed: Basic Metabolic Panel:  Recent Labs Lab 12/08/13 1654 12/09/13 0233  NA 142 143  K 4.1 4.1  CL 102 106  CO2 23 21  GLUCOSE 106* 95  BUN 20 18  CREATININE 1.07 0.86  CALCIUM 9.9 8.9   Liver Function Tests:  Recent  Labs Lab 12/09/13 0233  AST 18  ALT 6  ALKPHOS 67  BILITOT 0.4  PROT 6.8  ALBUMIN 3.4*   No results found for this basename: LIPASE, AMYLASE,  in the last 168 hours No results found for this basename: AMMONIA,  in the last 168 hours CBC:  Recent Labs Lab 12/08/13 1654 12/09/13 0233  WBC 11.1* 9.2  NEUTROABS 9.0*  --   HGB 15.5* 13.5  HCT 45.9 40.1  MCV 95.0 94.4  PLT 242 240   Cardiac Enzymes:  Recent Labs Lab 12/08/13 1654 12/08/13 2014 12/09/13 0159 12/09/13 0753  TROPONINI <0.30 <0.30 <0.30 <0.30   BNP (last 3 results) No results found for this basename: PROBNP,  in the last 8760 hours CBG: No results found for this basename: GLUCAP,  in the last 168 hours  No results found for this or any previous visit (from the past 240 hour(s)).   Studies: Ct Head Wo Contrast  12/08/2013   CLINICAL DATA:  Head trauma secondary to a fall today. Altered mental status. Forehead laceration.  EXAM: CT HEAD WITHOUT CONTRAST  TECHNIQUE: Contiguous axial images were obtained from the base of the skull through the vertex without intravenous contrast.  COMPARISON:  None.  FINDINGS: No mass lesion. No midline shift. No acute hemorrhage or hematoma. No extra-axial fluid collections. No evidence of acute infarction. There is diffuse cerebral cortical and cerebellar atrophy with  secondary ventricular dilatation.  No acute osseous abnormality. There is a scalp laceration and small scalp hematoma over the right frontal bone.  IMPRESSION: 1. No acute intracranial abnormality. 2. Scalp hematoma and laceration over the right frontal bone.   Electronically Signed   By: Rozetta Nunnery M.D.   On: 12/08/2013 18:10   Ct Cervical Spine Wo Contrast  12/08/2013   CLINICAL DATA:  Head trauma secondary to a fall. Altered mental status. Dementia.  EXAM: CT CERVICAL SPINE WITHOUT CONTRAST  TECHNIQUE: Multidetector CT imaging of the cervical spine was performed without intravenous contrast. Multiplanar CT image  reconstructions were also generated.  COMPARISON:  None.  FINDINGS: There is an oblique vertical fracture through the left lateral mass of C2. There is also a fracture through the anterior aspect of the right pedicle of C2. There is an epidural hematoma at the C1-2 level to the left of midline extending anterior and posterior to the spinal cord with compression of the thecal sac and minimal compression of the spinal cord at the C2-3 level. This hematoma extends along the left lamina of C3 to the level of the low C3-4 disc space. The anterior component of the epidural hematoma extends to the C5-6 level but this is only 3 mm in AP dimension.  There is also a slightly displaced fracture of left side of the posterior arch of C1. I cannot identify a second fracture of the ring of C1.  The patient has auto fusion of the C3-4 level. There is degenerative disc space narrowing at C4-5 through C6-7 with multilevel moderate facet arthritis from C2-3 through T2-3 on the right and at C2-3, C4-5, and T1-2 on the left.  There is no subluxation.  IMPRESSION: 1. Fractures of the left lateral mass and right pedicle of C2 with anterior and left lateral and left posterior lateral epidural hematoma with a slight mass effect upon the thecal sac and spinal cord at C2. No subluxation at the site of the fractures. 2. Single visible fracture of the left side of the posterior arch of C1.   Electronically Signed   By: Rozetta Nunnery M.D.   On: 12/08/2013 18:23   Dg Pelvis Portable  12/08/2013   CLINICAL DATA:  History of fall.  Pelvic tenderness.  EXAM: PORTABLE PELVIS 1-2 VIEWS  COMPARISON:  No priors.  FINDINGS: Single AP view of the pelvis demonstrates no definite acute displaced fractures of the bony pelvic ring. Bilateral proximal femurs as visualized appear intact, the femoral heads project over the acetabula bilaterally on this single view examination. Numerous surgical clips are noted throughout the abdominal and pelvic regions.   IMPRESSION: 1. Single view examination demonstrates no definite acute traumatic injury to the bony pelvis.   Electronically Signed   By: Vinnie Langton M.D.   On: 12/08/2013 17:28   Dg Chest Portable 1 View  12/08/2013   CLINICAL DATA:  History of trauma from a fall. Cervical spine fracture.  EXAM: PORTABLE CHEST - 1 VIEW  COMPARISON:  No priors.  FINDINGS: Lung volumes are normal. No consolidative airspace disease. No pleural effusions. No evidence of pulmonary edema. No pneumothorax. No suspicious appearing pulmonary nodules or masses. Heart size is normal. Mediastinal contours are unremarkable. Atherosclerosis in the thoracic aorta.  IMPRESSION: 1. No radiographic evidence of acute cardiopulmonary disease. 2. Atherosclerosis.   Electronically Signed   By: Vinnie Langton M.D.   On: 12/08/2013 19:13    Scheduled Meds: . cefTRIAXone (ROCEPHIN)  IV  1 g Intravenous  Q24H   Continuous Infusions:   Principal Problem:   C2 cervical fracture Active Problems:   Syncope    Time spent: 30 minutes    [facility] Pager [phone]. If 7PM-7AM, please contact night-coverage at [email or web address], password [facility] 12/09/2013, 11:47 AM  LOS: 1 day

## 2013-12-09 NOTE — Evaluation (Signed)
Physical Therapy Evaluation Patient Details Name: Cynthia [name] MRN: [id number] DOB: February 09, 1925 Today's Date: 12/09/2013 Time: 1010-1040 PT Time Calculation (min): 30 min  PT Assessment / Plan / Recommendation History of Present Illness  80 year old female who  has a past medical history of Breast cancer and Dementia. today was brought to the ED him him by ambulance after patient was found by plumber in her carport with a hematoma to the forehead and laceration. Patient has a history of dementia and is not sure whether she fell or passed out. She does not have any collection of the event. At this time patient is back to her baseline she is alert and oriented x3, she denies history of syncope, no recent falls at home, she denies chest pain or shortness of breath, no recent nausea vomiting or diarrhea no fever no runny nose or sore throat.  Clinical Impression  Cynthia [name] is a very cooperative pt who states she has had problem with being dizzy prior to her fall.  She currently will benefit from skilled therapy to increase her activity tolerance and strength to maximize her functional ability.  Pt will benefit from SNF placement after discharge.    PT Assessment  Patient needs continued PT services    Follow Up Recommendations  SNF    Does the patient have the potential to tolerate intense rehabilitation    no  Barriers to Discharge Decreased caregiver support      Equipment Recommendations  None recommended by PT    Recommendations for Other Services OT consult   Frequency Min 5X/week    Precautions / Restrictions Precautions Precautions: Fall Required Braces or Orthoses: Cervical Brace Cervical Brace: Hard collar;At all times Restrictions Weight Bearing Restrictions: No   Pertinent Vitals/Pain 6/10 cervical      Mobility  Bed Mobility Overal bed mobility: Needs Assistance Bed Mobility: Supine to Sit Supine to sit: Min assist Transfers Overall transfer level: Needs  assistance Equipment used: Rolling walker (2 wheeled) Transfers: Sit to/from Stand Sit to Stand: Min guard Ambulation/Gait Ambulation/Gait assistance: Min assist Ambulation Distance (Feet): 20 Feet Assistive device: Rolling walker (2 wheeled) Gait velocity interpretation: Below normal speed for age/gender General Gait Details: Pt complains of dizziness with increased activity.  Pt also complains of cervical pain.      Exercises     PT Diagnosis: Difficulty walking;Generalized weakness;Acute pain  PT Problem List: Decreased strength;Decreased activity tolerance;Decreased balance;Pain PT Treatment Interventions: Gait training;Therapeutic exercise     PT Goals(Current goals can be found in the care plan section) Acute Rehab PT Goals Patient Stated Goal: To not be dizzy and not have the pain in her neck PT Goal Formulation: With patient Time For Goal Achievement: 12/12/13 Potential to Achieve Goals: Good  Visit Information  Last PT Received On: 12/09/13 History of Present Illness: 80 year old female who  has a past medical history of Breast cancer and Dementia. today was brought to the ED him him by ambulance after patient was found by plumber in her carport with a hematoma to the forehead and laceration. Patient has a history of dementia and is not sure whether she fell or passed out. She does not have any collection of the event. At this time patient is back to her baseline she is alert and oriented x3, she denies history of syncope, no recent falls at home, she denies chest pain or shortness of breath, no recent nausea vomiting or diarrhea no fever no runny nose or sore throat.  Prior [place] expects to be discharged to:: Skilled nursing facility Living Arrangements: Alone    Cognition  Cognition Arousal/Alertness: Awake/alert Overall Cognitive Status: History of cognitive impairments - at baseline Memory: Decreased short-term memory     Extremity/Trunk Assessment Lower Extremity Assessment Lower Extremity Assessment: Overall WFL for tasks assessed   Balance    End of Session PT - End of Session Equipment Utilized During Treatment: Gait belt;Cervical collar Activity Tolerance: Other (comment) (limited by dizziness) Patient left: in chair;with chair alarm set  GP     Cynthia [name] 12/09/2013, 10:47 AM

## 2013-12-09 NOTE — Clinical Social Work Placement (Signed)
Clinical Social Work Department CLINICAL SOCIAL WORK PLACEMENT NOTE 12/09/2013  Patient:  Cynthia [name], Cynthia [name]  Account Number:  [id number] Admit date:  12/08/2013  Clinical Social Worker:  Benay Pike, LCSW  Date/time:  12/09/2013 02:00 PM  Clinical Social Work is seeking post-discharge placement for this patient at the following level of care:   [place]   (*CSW will update this form in Epic as items are completed)   12/09/2013  Patient/family provided with [facility] Department of Clinical Social Work's list of facilities offering this level of care within the geographic area requested by the patient (or if unable, by the patient's family).  12/09/2013  Patient/family informed of their freedom to choose among providers that offer the needed level of care, that participate in Medicare, Medicaid or managed care program needed by the patient, have an available bed and are willing to accept the patient.  12/09/2013  Patient/family informed of [facility]' ownership interest in [facility], as well as of the fact that they are under no obligation to receive care at this facility.  PASARR submitted to EDS on 12/09/2013 PASARR number received from EDS on 12/09/2013  FL2 transmitted to all facilities in geographic area requested by pt/family on  12/09/2013 FL2 transmitted to all facilities within larger geographic area on   Patient informed that his/her managed care company has contracts with or will negotiate with  certain facilities, including the following:     Patient/family informed of bed offers received:   Patient chooses bed at  Physician recommends and patient chooses bed at    Patient to be transferred to  on   Patient to be transferred to facility by   The following physician request were entered in Epic:   Additional Comments:  Benay Pike, [place]

## 2013-12-09 NOTE — Clinical Social Work Psychosocial (Signed)
Clinical Social Work Department BRIEF PSYCHOSOCIAL ASSESSMENT 12/09/2013  Patient:  Cynthia [name], Cynthia [name]     Account Number:  [id number]     Admit date:  12/08/2013  Clinical Social Worker:  Wyatt Haste  Date/Time:  12/09/2013 11:34 AM  Referred by:  CSW  Date Referred:  12/09/2013 Referred for  SNF Placement   Other Referral:   Interview type:  Patient Other interview type:    PSYCHOSOCIAL DATA Living Status:  ALONE Admitted from facility:   Level of care:   Primary support name:  Cynthia [name] Primary support relationship to patient:  CHILD, ADULT Degree of support available:   Lives in [place] Current Concerns  Post-Acute Placement   Other Concerns:    SOCIAL WORK ASSESSMENT / PLAN CSW met with pt at bedside after PT evaluation. Pt alert and oriented x3 during assessment. She reports she lives alone. Per H&P, pt was found by plumber in her carport. She had a hematoma and laceration to her forehead. Also has C2 fracture. Pt does not remember what happened. She states her daughter lives in [place]. Pt appears to have somewhat limited support locally. She states if needed, she can call her neighbor for assistance. Pt generally does not drive, but said she has started driving to the grocery store. She reports she really has no other place she needs to go. Pt laughs and says she doesn't go to the doctor. At baseline, pt is completely independent with ADLs. PT evaluation completed this morning and recommendation is for SNF. CSW discussed with pt. She quickly became somewhat offended regarding suggestion. Using sing-song voice, pt said "You are pushing me." She made it quite clear that she was not ready to consider SNF. CSW attempted to encourage pt that hopefully this would be short term. Pt states she is aware of [facility] and [facility] which are located in [place]. She is agreeable to CSW talking to her daughter. CSW left voicemail for daughter requesting return call. Will  discuss SNF with her to see if daughter could discuss with pt. SNF list left in room.   Assessment/plan status:  Psychosocial Support/Ongoing Assessment of Needs Other assessment/ plan:   Information/referral to community resources:   SNF list    PATIENT'S/FAMILY'S RESPONSE TO PLAN OF CARE: Pt was appropriate during assessment with CSW. Per NP, pt appears to have capacity. CSW will discuss with daughter and follow up to continue discussion about SNF.     Cynthia [name], Cynthia [name]

## 2013-12-09 NOTE — Progress Notes (Signed)
*  PRELIMINARY RESULTS* Echocardiogram 2D Echocardiogram has been performed.  [place], [place] 12/09/2013, 9:46 AM

## 2013-12-09 NOTE — Care Management Note (Addendum)
    Page 1 of 1   12/10/2013     1:01:21 PM   CARE MANAGEMENT NOTE 12/10/2013  Patient:  Cynthia [name], Cynthia [name]   Account Number:  [id number]  Date Initiated:  12/09/2013  Documentation initiated by:  Theophilus Kinds  Subjective/Objective Assessment:   Pt admitted from home with UTI and cervical fractures. Pt lives alone and has a daughter who lives in [place]. Pt has no other close family nearby and has some neighbors that she calls on at times.     Action/Plan:   PT is recommending SNF. CSW is aware and has started a SNF bed search.   Anticipated DC Date:  12/11/2013   Anticipated DC Plan:  SKILLED NURSING FACILITY  In-house referral  Clinical Social Worker      DC Planning Services  CM consult      Choice offered to / List presented to:             Status of service:  Completed, signed off Medicare Important Message given?  YES (If response is "NO", the following Medicare IM given date fields will be blank) Date Medicare IM given:  12/10/2013 Date Additional Medicare IM given:    Discharge Disposition:  [place]  Per UR Regulation:    If discussed at Long Length of Stay Meetings, dates discussed:    Comments:  12/10/13 [place], RN BSN CM Pt discharged to [facility] today. CSW to arrange discharge to facility.  12/09/13 [place], RN BSN CM

## 2013-12-10 ENCOUNTER — Telehealth: Payer: Self-pay | Admitting: *Deleted

## 2013-12-10 ENCOUNTER — Inpatient Hospital Stay
Admission: RE | Admit: 2013-12-10 | Discharge: 2017-01-26 | Disposition: A | Discharge status: Nursing Home (Includes ICF) | Payer: Medicare Other | Source: Ambulatory Visit | Attending: Internal Medicine | Admitting: Internal Medicine

## 2013-12-10 DIAGNOSIS — W19XXXA Unspecified fall, initial encounter: Secondary | ICD-10-CM

## 2013-12-10 DIAGNOSIS — S12100A Unspecified displaced fracture of second cervical vertebra, initial encounter for closed fracture: Secondary | ICD-10-CM

## 2013-12-10 DIAGNOSIS — F0390 Unspecified dementia without behavioral disturbance: Secondary | ICD-10-CM

## 2013-12-10 DIAGNOSIS — R55 Syncope and collapse: Secondary | ICD-10-CM

## 2013-12-10 DIAGNOSIS — R269 Unspecified abnormalities of gait and mobility: Secondary | ICD-10-CM

## 2013-12-10 DIAGNOSIS — G309 Alzheimer's disease, unspecified: Secondary | ICD-10-CM

## 2013-12-10 DIAGNOSIS — F0280 Dementia in other diseases classified elsewhere without behavioral disturbance: Secondary | ICD-10-CM

## 2013-12-10 DIAGNOSIS — N390 Urinary tract infection, site not specified: Principal | ICD-10-CM

## 2013-12-10 MED ORDER — CEFUROXIME AXETIL 250 MG PO TABS: 250.0000 mg | ORAL_TABLET | Freq: Two times a day (BID) | ORAL | Status: DC

## 2013-12-10 MED ORDER — CIPROFLOXACIN HCL 250 MG PO TABS: 250.0000 mg | ORAL_TABLET | Freq: Two times a day (BID) | ORAL | Status: DC

## 2013-12-10 NOTE — Progress Notes (Signed)
Physical Therapy Treatment Patient Details Name: Cynthia [name] MRN: [id number] DOB: 1925/05/30 Today's Date: 12/10/2013 Time: 0930-1008 PT Time Calculation (min): 38 min  PT Assessment / Plan / Recommendation  History of Present Illness     PT Comments   Pt with min guard with bed mobility, cueing for handplacement to assist with sitting by EOB and with sit to stand.  Pt stated her neck was pain free with increased pain with movement to 3/10.  Increased gait training distance with min c/o dizziness, no LOB episodes noted during gait training with RW min guard.  Pt left in chair with chair alarm set and call bell within reach, social worker in room.    Follow Up Recommendations        Does the patient have the potential to tolerate intense rehabilitation     Barriers to Discharge        Equipment Recommendations       Recommendations for Other Services    Frequency     Progress towards PT Goals Progress towards PT goals: Progressing toward goals  Plan      Precautions / Restrictions Precautions Precautions: Fall Required Braces or Orthoses: Cervical Brace Cervical Brace: Hard collar;At all times Restrictions Weight Bearing Restrictions: No    Mobility  Bed Mobility Overal bed mobility: Needs Assistance Bed Mobility: Supine to Sit Supine to sit: Min assist Transfers Overall transfer level: Needs assistance Equipment used: Rolling walker (2 wheeled) Transfers: Sit to/from Stand Sit to Stand: Min guard Ambulation/Gait Ambulation/Gait assistance: Min guard Ambulation Distance (Feet): 250 Feet Assistive device: Rolling walker (2 wheeled) Gait Pattern/deviations: WFL(Within Functional Limits) Gait velocity interpretation: Below normal speed for age/gender General Gait Details: Pt complains of dizziness with increased activity.  Pt also complains of cervical pain.      Exercises General Exercises - Lower Extremity Ankle Circles/Pumps: AROM;Both;10 reps Quad Sets:  AROM;Both;10 reps Heel Slides: AROM;Both;10 reps Hip ABduction/ADduction: AROM;Both;10 reps Straight Leg Raises: AROM;Both;5 reps   PT Diagnosis:    PT Problem List:   PT Treatment Interventions:     PT Goals (current goals can now be found in the care plan section)    Visit Information  Last PT Received On: 12/10/13    Subjective Data   I'm pain free today unless I pick up my head.   Cognition  Cognition Arousal/Alertness: Awake/alert Behavior During Therapy: WFL for tasks assessed/performed Overall Cognitive Status: History of cognitive impairments - at baseline Memory: Decreased short-term memory    Balance     End of Session PT - End of Session Equipment Utilized During Treatment: Gait belt;Cervical collar Activity Tolerance: Patient tolerated treatment well Patient left: in chair;with chair alarm set;with call bell/phone within reach   GP     Aldona Lento 12/10/2013, 10:06 AM

## 2013-12-10 NOTE — Telephone Encounter (Signed)
Message copied by Truett Mainland on Thu Dec 10, 2013  1:06 PM ------      Message from: Lestine Mount      Created: Thu Dec 10, 2013 12:59 PM      Regarding: holter monitor       Need holter monitor order placed for syncope.   ------

## 2013-12-10 NOTE — Discharge Summary (Signed)
Physician Discharge Summary  Cynthia [name] MWU:[id number] DOB: [birth year] DOA: 12/08/2013  PCP: Glo Herring., MD  Admit date: 12/08/2013 Discharge date: 12/10/2013  Time spent: 40  minutes  Recommendations for Outpatient Follow-up:  1. PCP in 1 week for evaluation of symptoms. Recommend BMET to track sodium level.  2. Dr. Joya Salm in 4 weeks for evaluation of C2 fracture. Patient to keep Aspen collar on at all times until then.   Discharge Diagnoses:  Principal Problem:   C2 cervical fracture Active Problems:   Syncope   Discharge Condition: stable  Diet recommendation: heart healthy  Filed Weights   12/08/13 1640 12/08/13 2006  Weight: 65.772 kg (145 lb) 58.06 kg (128 lb)    History of present illness:  80 year old female who has a past medical history of Breast cancer and Dementia brought to the ED on 12/08/13 ambulance after patient was found by plumber in her carport with a hematoma to the forehead and laceration. Patient has a history of dementia and is not sure whether she fell or passed out. She did not have any recollection of the event. In ED patient  back to her baseline and was alert and oriented x3, she denied history of syncope, no recent falls at home, she denied chest pain or shortness of breath, no recent nausea vomiting or diarrhea no fever no runny nose or sore throat.  In the ED patient was found to have C2 cervical fracture with epidural hematoma, C1 fracture. Cardiac enzymes were negative, EKG showed no significant arrhythmia. Patient had a laceration to the forehead which was repaired by the ED physician. Patient lived by herself, her daughter lives in [place].  Neurosurgery Dr. Joya Salm was consulted by the ED physician who reviewed the films and recommended nonoperative management   Hospital Course:  Fall/?syncope  No events on tele. 2-D echocardiogram with EF 70%, no regional wall abnormalities and grade 1 diastolic dysfunction. Cardiac enzymes  negative x3. Somewhat orthostatic from sitting to standing. PT evaluation recommending SNF C2 cervical spine fracture  Neurosurgery consulted by the ED physician, films were reviewed by the neurosurgeon Dr. Joya Salm, and he recommended nonoperative management with Aspen collar for 4 weeks and followup as outpatient in 4 weeks and will repeat imaging at that time.   UTI  Patient was found to have abnormal UA. Received rocephin for 2 doses.will discharge with 3 days cipro to complete 5 day course. Culture pending at discharge. She remained afebrile and non-toxic appearing.  H/o Dementia  Appears stable at baseline. Spoke with daughter at length regarding patients baseline status. Reportedly patient with severe short term memory issues. She feels placement is best option for pt for now and anticipates needing long term perhaps ALF once #2 resolved   Procedures: 2-decho 05/16/35 Systolic function was vigorous. The estimated ejection fraction is >70%. There is a mild midcavitary gradient caused by systolic cavity obliteration. Wall motion was normal; there were no regional wall motion abnormalities. Doppler parameters are consistent with abnormal left ventricular relaxation (grade 1 diastolic dysfunction).  Consultations:  Dr Joya Salm neurosurgical  Discharge Exam: Filed Vitals:   12/10/13 0616  BP: 127/71  Pulse: 96  Temp: 97.8 F (36.6 C)  Resp: 20    General: thin frail NAD Cardiovascular: RRR No m/g/r no LE edema Respiratory: BS clear bilaterally no wheeze no rhonchi. Normal respiratory effort MS: Aspen collar intact. UE strength 5/5.  Skin: hematoma with slight swelling right eye. EOMI intact  Discharge Instructions     Medication List  ciprofloxacin 250 MG tablet  Commonly known as:  CIPRO  Take 1 tablet (250 mg total) by mouth 2 (two) times daily.       No Known Allergies     Follow-up Information   Follow up with Glo Herring., MD. Schedule an  appointment as soon as possible for a visit in 1 week. (evaluation of symptoms)    Specialty:  Internal Medicine   Contact information:   [id number] [place] [address] [phone]       Follow up with Floyce Stakes, MD In 4 weeks. (will need appointment in 4 weeks)    Specialty:  Neurosurgery   Contact information:   [address] [place] [address] [phone]        The results of significant diagnostics from this hospitalization (including imaging, microbiology, ancillary and laboratory) are listed below for reference.    Significant Diagnostic Studies: Ct Head Wo Contrast  12/08/2013   CLINICAL DATA:  Head trauma secondary to a fall today. Altered mental status. Forehead laceration.  EXAM: CT HEAD WITHOUT CONTRAST  TECHNIQUE: Contiguous axial images were obtained from the base of the skull through the vertex without intravenous contrast.  COMPARISON:  None.  FINDINGS: No mass lesion. No midline shift. No acute hemorrhage or hematoma. No extra-axial fluid collections. No evidence of acute infarction. There is diffuse cerebral cortical and cerebellar atrophy with secondary ventricular dilatation.  No acute osseous abnormality. There is a scalp laceration and small scalp hematoma over the right frontal bone.  IMPRESSION: 1. No acute intracranial abnormality. 2. Scalp hematoma and laceration over the right frontal bone.   Electronically Signed   By: Rozetta Nunnery M.D.   On: 12/08/2013 18:10   Ct Cervical Spine Wo Contrast  12/08/2013   CLINICAL DATA:  Head trauma secondary to a fall. Altered mental status. Dementia.  EXAM: CT CERVICAL SPINE WITHOUT CONTRAST  TECHNIQUE: Multidetector CT imaging of the cervical spine was performed without intravenous contrast. Multiplanar CT image reconstructions were also generated.  COMPARISON:  None.  FINDINGS: There is an oblique vertical fracture through the left lateral mass  of C2. There is also a fracture through the anterior aspect of the right pedicle of C2. There is an epidural hematoma at the C1-2 level to the left of midline extending anterior and posterior to the spinal cord with compression of the thecal sac and minimal compression of the spinal cord at the C2-3 level. This hematoma extends along the left lamina of C3 to the level of the low C3-4 disc space. The anterior component of the epidural hematoma extends to the C5-6 level but this is only 3 mm in AP dimension.  There is also a slightly displaced fracture of left side of the posterior arch of C1. I cannot identify a second fracture of the ring of C1.  The patient has auto fusion of the C3-4 level. There is degenerative disc space narrowing at C4-5 through C6-7 with multilevel moderate facet arthritis from C2-3 through T2-3 on the right and at C2-3, C4-5, and T1-2 on the left.  There is no subluxation.  IMPRESSION: 1. Fractures of the left lateral mass and right pedicle of C2 with anterior and left lateral and left posterior lateral epidural hematoma with a slight mass effect upon the thecal sac and spinal cord at C2. No subluxation at the site of the fractures. 2. Single visible fracture of the left side of  the posterior arch of C1.   Electronically Signed   By: Rozetta Nunnery M.D.   On: 12/08/2013 18:23   Dg Pelvis Portable  12/08/2013   CLINICAL DATA:  History of fall.  Pelvic tenderness.  EXAM: PORTABLE PELVIS 1-2 VIEWS  COMPARISON:  No priors.  FINDINGS: Single AP view of the pelvis demonstrates no definite acute displaced fractures of the bony pelvic ring. Bilateral proximal femurs as visualized appear intact, the femoral heads project over the acetabula bilaterally on this single view examination. Numerous surgical clips are noted throughout the abdominal and pelvic regions.  IMPRESSION: 1. Single view examination demonstrates no definite acute traumatic injury to the bony pelvis.   Electronically Signed   By:  Vinnie Langton M.D.   On: 12/08/2013 17:28   Dg Chest Portable 1 View  12/08/2013   CLINICAL DATA:  History of trauma from a fall. Cervical spine fracture.  EXAM: PORTABLE CHEST - 1 VIEW  COMPARISON:  No priors.  FINDINGS: Lung volumes are normal. No consolidative airspace disease. No pleural effusions. No evidence of pulmonary edema. No pneumothorax. No suspicious appearing pulmonary nodules or masses. Heart size is normal. Mediastinal contours are unremarkable. Atherosclerosis in the thoracic aorta.  IMPRESSION: 1. No radiographic evidence of acute cardiopulmonary disease. 2. Atherosclerosis.   Electronically Signed   By: Vinnie Langton M.D.   On: 12/08/2013 19:13    Microbiology: No results found for this or any previous visit (from the past 240 hour(s)).   Labs: Basic Metabolic Panel:  Recent Labs Lab 12/08/13 1654 12/09/13 0233  NA 142 143  K 4.1 4.1  CL 102 106  CO2 23 21  GLUCOSE 106* 95  BUN 20 18  CREATININE 1.07 0.86  CALCIUM 9.9 8.9   Liver Function Tests:  Recent Labs Lab 12/09/13 0233  AST 18  ALT 6  ALKPHOS 67  BILITOT 0.4  PROT 6.8  ALBUMIN 3.4*   No results found for this basename: LIPASE, AMYLASE,  in the last 168 hours No results found for this basename: AMMONIA,  in the last 168 hours CBC:  Recent Labs Lab 12/08/13 1654 12/09/13 0233  WBC 11.1* 9.2  NEUTROABS 9.0*  --   HGB 15.5* 13.5  HCT 45.9 40.1  MCV 95.0 94.4  PLT 242 240   Cardiac Enzymes:  Recent Labs Lab 12/08/13 1654 12/08/13 2014 12/09/13 0159 12/09/13 0753  TROPONINI <0.30 <0.30 <0.30 <0.30   BNP: BNP (last 3 results) No results found for this basename: PROBNP,  in the last 8760 hours CBG: No results found for this basename: GLUCAP,  in the last 168 hours     Signed:  Radene Gunning  Cynthia [name] 12/10/2013, 11:27 AM

## 2013-12-10 NOTE — Discharge Summary (Signed)
Patient interviewed and examined. Reviewed chart in detail. Discussed with Ms. Dyanne Carrel, NP- reviewed her DC summary and following addendum/changes made.   Patient seen ambulating with PT and walker in the hall this morning. Complained of mild in the back of her head and neck but denied any dizziness, lightheadedness, chest pain or palpitations.  On exam, periorbital bruising/ecchymosis right >left, superficial sutured tiny laceration right side of forehead without acute findings, Aspen cervical collar in place.  As per patient and friend, lives alone, mostly independent-has a walker but rarely uses it. She cannot recall the events leading to the fall in her car port. States that she does not think that she passed out. Denies history of preceding chest pain, palpitation, dizziness or lightheadedness. Her friend states that the area generally is slippery.  Patient sustained a fall but it is not clear whether she truly had a syncopal episode. Her cervical spine fracture management was discussed by Ms. Black with the neurosurgeon Dr. Joya Salm who recommended that she has to keep the Aspen cervical collar for 4 weeks and followup with him as outpatient with repeat imaging.  Discussed her 2-D echocardiogram results with reading cardiologist who advised that other than features suggestive of hypovolemia, there wasn't anything remarkable. Patient was hydrated with IV fluids overnight. Telemetry shows mostly sinus rhythm and occasional runs of NSSVT. Discussed with Dr. Carlyle Dolly, Cardiology who will arrange for outpatient Holter monitoring and followup with cardiology.  Patient was treated empirically with IV Rocephin for presumed UTI-he did complain of some dysuria yesterday. Unfortunately no urine cultures were sent. Although initial plans were to discharge patient on Cipro, given her a history of dementia and likelihood of confusion from Cipro, will change to oral Ceftin 250 twice a day to complete  total one-week treatment.  Vernell Leep, MD, FACP, FHM. [facility] Pager [phone]  If 7PM-7AM, please contact night-coverage [email or web address] Password [facility] 12/10/2013, 12:51 PM

## 2013-12-10 NOTE — Clinical Social Work Note (Addendum)
CSW presented bed offers to pt who deferred to daughter. Daughter states she wants pt to make decision since she will be the one going to facility. Pt eventually chose [facility]. Facility notified and aware that daughter is willing to assist with paperwork if needed by fax. D/C today. CSW will fax d/c summary when completed and pt to transfer with RN. [place] notified of change from Cipro to Ceftin as documented in physician's d/c summary. RN will also inform in report.   Benay Pike, [place]

## 2013-12-10 NOTE — Clinical Social Work Placement (Signed)
Clinical Social Work Department CLINICAL SOCIAL WORK PLACEMENT NOTE 12/10/2013  Patient:  Cynthia [name], Cynthia [name]  Account Number:  [id number] Admit date:  12/08/2013  Clinical Social Worker:  Benay Pike, LCSW  Date/time:  12/09/2013 02:00 PM  Clinical Social Work is seeking post-discharge placement for this patient at the following level of care:   [facility]   (*CSW will update this form in Epic as items are completed)   12/09/2013  Patient/family provided with [facility] Department of Clinical Social Work's list of facilities offering this level of care within the geographic area requested by the patient (or if unable, by the patient's family).  12/09/2013  Patient/family informed of their freedom to choose among providers that offer the needed level of care, that participate in Medicare, Medicaid or managed care program needed by the patient, have an available bed and are willing to accept the patient.  12/09/2013  Patient/family informed of [facility]' ownership interest in [facility], as well as of the fact that they are under no obligation to receive care at this facility.  PASARR submitted to EDS on 12/09/2013 PASARR number received from EDS on 12/09/2013  FL2 transmitted to all facilities in geographic area requested by pt/family on  12/09/2013 FL2 transmitted to all facilities within larger geographic area on   Patient informed that his/her managed care company has contracts with or will negotiate with  certain facilities, including the following:     Patient/family informed of bed offers received:  12/10/2013 Patient chooses bed at [facility] Physician recommends and patient chooses bed at  [facility]  Patient to be transferred to [facility] on  12/10/2013 Patient to be transferred to facility by RN  The following physician request were entered in Epic:   Additional Comments:  Benay Pike, [place]

## 2013-12-10 NOTE — Progress Notes (Signed)
Patient discharging to [facility].  Report called and given to [facility] at facility.  No questions at this time.  Cervical collar in place on patient.  IV removed - WNL.  Packet sent with patient per SW.  Patient, Patients friend Marliss Czar Mcmichael aware of discharge as well as daughter. Patient stable to DC at this time

## 2013-12-11 ENCOUNTER — Non-Acute Institutional Stay (SKILLED_NURSING_FACILITY): Payer: Medicare Other | Admitting: Internal Medicine

## 2013-12-11 ENCOUNTER — Other Ambulatory Visit: Payer: Self-pay

## 2013-12-11 ENCOUNTER — Other Ambulatory Visit: Payer: Self-pay | Admitting: *Deleted

## 2013-12-11 ENCOUNTER — Encounter: Payer: Self-pay | Admitting: Internal Medicine

## 2013-12-11 DIAGNOSIS — F0390 Unspecified dementia without behavioral disturbance: Secondary | ICD-10-CM | POA: Insufficient documentation

## 2013-12-11 DIAGNOSIS — F03918 Unspecified dementia, unspecified severity, with other behavioral disturbance: Secondary | ICD-10-CM | POA: Insufficient documentation

## 2013-12-11 DIAGNOSIS — N390 Urinary tract infection, site not specified: Secondary | ICD-10-CM

## 2013-12-11 DIAGNOSIS — D126 Benign neoplasm of colon, unspecified: Secondary | ICD-10-CM | POA: Insufficient documentation

## 2013-12-11 DIAGNOSIS — S12100A Unspecified displaced fracture of second cervical vertebra, initial encounter for closed fracture: Secondary | ICD-10-CM

## 2013-12-11 DIAGNOSIS — C50919 Malignant neoplasm of unspecified site of unspecified female breast: Secondary | ICD-10-CM | POA: Insufficient documentation

## 2013-12-11 MED ORDER — HYDROCODONE-ACETAMINOPHEN 5-325 MG PO TABS: ORAL_TABLET | ORAL | Status: DC

## 2013-12-11 NOTE — Telephone Encounter (Signed)
[facility] 

## 2013-12-11 NOTE — Telephone Encounter (Signed)
RX was faxed to [facility] @ [phone], phone number [phone]

## 2013-12-11 NOTE — Progress Notes (Signed)
Patient ID: Cynthia [name], female   DOB: 05-29-1925, 80 y.o.   MRN: [id number]   This is an acute visit.  Level of care skilled.  Facility [facility].  Ch  complaint-acute visit status post hospitalization for cervical neck fracture-UTI.  History of present illness.  Patient is a pleasant 80 year old female with a past medical history of breast cancer and dementia was brought to the emergency department on February 17 after she was found by the plumber in her carport with a hematoma to the forehead as well as a laceration.  Patient could not recall whether she fell or passed out she does have a history of dementia.  She remained alert and oriented.  She was found to have a C2 cervical fracture with epidermal hematoma-C1 fracture-EKG did not show anything remarkable cardiac enzymes were negative her laceration was treated in the ER.  Neurosurgery was consulted and recommended nonoperative management of the cervical fractures.  She does continue on an Aspen collar for 4 weeks and will need followup by neurosurgery  Apparently urinalysis was suspicious for UTI and she was started on Rocephin for 2 doses and discharged with 3 days of Cipro-it appears the culture is still pending will write an order to see if we can get those results.  The hospital physician did speak with her daughter at length about baseline status apparently she has severe short-term memory issues and it was thought best option for patient now is skilled  nursing with possible transition to assisted living facility at some point  Currently patient has no complaints however apparently last night she did complain of some increased neck discomfort and an order for hydrocodone 05/325 milligrams was obtained every 6 hours when necessary apparently this is giving her relief she denies any pain currently although apparently had complained of some pain earlier in the day.  Patient appears to have a fairly benign medical history in  fact the only medication she is on is the ciprofloxacin as well as the Norco for pain-.  Previous medical history.  History of cervical neck fracture status post fall.  UTI.  Dementia.  History of breast cancer  History of benign colonic neoplasm  Surgical history.  April 2008-right modified radical mastectomy secondary to ductal carcinoma right breast.  2006-right hemicolectomy-- secondary to what was diagnosed as a benign colonic neoplasm  Hospital studies.  2- 17 2015.  CT of the head without contrast-no acute intracranial abnormality scalp hematoma and laceration over the right frontal bone  CT cervical spine without contrast-showed fractures of the left lateral mass and right pedicle of C2 with anterior and left lateral and left posterior lateral epidural hematoma with a slight mass effect upon the thecal sac and spinal cord at C2-no subluxation at the site of the fractures-there is a single visible fracture of the left side of the posterior arch of C1.  2- 17 2015-pelvic x-ray.  No definite acute traumatic injury to the bony pelvis.  Chest x-ray-no radiographic evidence of acute cardiopulmonary disease  2-D echo-. Showed a left ventricular ejection fraction of greater than 01%-UXNAT 1 diastolic dysfunction  Medications   Ceftin 250 mg twice a day x6 additional days.  Hydrocodone 5-325 mg 1 tab daily 6 hours when necessary pain  .    History-limited information about patient does not have any smoking history no smokeless tobacco history on file-no history of alcohol or illicit drug use.  Apparently she had been living alone in her house in the [place] area in  [place] she sustained a fall.  Family history is not available from any current source.  Review of systems.  In general is not complaining of any fever or chills.  Skin-does have a laceration to forehead as well as hematoma right side of her face  Head ears eyes nose mouth and throat-does not  complaining of any visual changes she has prescription lenses does not complain of any blurred vision.  Respiratory no complaints of shortness of breath or cough  Cardiac-no complaints of chest pain.  GI denies any abdominal pain no nausea vomiting diarrhea or constipation as been noted.  GU been treated for UTI but does not complaining of dysuria apparently does have some frequency  Muscle skeletal-apparently did complain of some neck discomfort overnight she's not really complaining of this today but does say the collar feels a bit uncomfortable at times think she finds this irritating.  Neurologic does not complaining of any headache or dizziness does have a history of cervical spine fracture as noted above but did not complain of any numbness tingling  Psych she does have some history of dementia as noted above did not appear anxious.  Physical exam.  Temperature 97.3 pulse 76 respirations 20 blood pressure 144/77-127/70.  Weight is 122.2.  In general this is a pleasant frail-appearing elderly female in no distress sitting comfortably in her wheelchair.  Her skin is warm and dry she does have some violaceous bruising lateral to her right orbital area also midforehead laceration is currently covered with bandaging.  Eyes right conjunctiva appears to have a small amount of conjunctival erythema pupils is reactive to light extraocular movements are intact left conjunctiva appears clear again pupil is reactive to light extraocular movement intact-visual acuity appears grossly intact she does have prescription lenses  Neck--e does have a cervical collar in place.  Oropharynx is clear mucous membranes moist.  Chest is clear to auscultation there is no labored breathing  Heart is regular rate and rhythm without murmur gallop or rub she does not really have significant lower extremity edema.  Abdomen is soft nontender with positive bowel sounds.  Muscle skeletal is able to move all  extremities with appropriate grip strength and range of motion --I do not note any deformities other than arthritic changes.  Neurologic as appears grossly intact no lateralizing findings cranial nerves appear grossly intact-strength in all 4 extremities appears appropriate.  Psych she is oriented to self was able to tell me her daughter lives in [place] but stated the year was 2012 and the month was December.  Labs.  12/09/2013.  Sodium 143 potassium 4.1 BUN 18 creatinine 0.86.  Liver function tests within normal limits.  WBC 9.2 hemoglobin 13.5 platelets 240.  Assessment and plan.  #1 history of cervical neck fracture-this is followed by neurosurgery recommendation for Aspen collar x4 weeks-at this point pain appears to be controlled with the hydrocodone although apparently that was an issue last night and earlier this morning-this will have to be monitored she does appear comfortable today-neurologically  appears to be intact.  #2-UTI-apparently urinalysis was indicative of infection will write an order to obtain urine culture results when available it does not appear per review of EPIC they are available yet== she is completing a course of Ceftin  #3-dementia-this appears actually to be fairly significant will write order for social services to conduct Mini-Mental Status exam-on initial impression it appears that she would be a poor candidate to return to an independent setting I suspect assisted living would  be more appropriate.  #4-history of breast cancer she is status post right modified radical mastectomy --apparently this has been stable for some time this had been followed by Dr. Gerarda Fraction  5-history of right Hemicolectomy-this was apparently due to a benign colonic neoplasm--- apparently this has not been an issue in some time.  #6 status question history hypertension-I note she does have one elevated systolic in the 0000000 however previous systolic was [id number] we will continue to  monitor I suspect we will be fairly conservative in treatment here.  #7-question history syncope-apparently patient denies this I see per hospital discharge note this was discussed with Dr. Harl Bowie of cardiology who apparently is arranging outpatient Holter monitoring and followup up --will write order to see this was followed up on  Of note we'll update laboratory including a CBC a metabolic panel next week  [id number]-of note greater than 35 minutes spent assessing patient-reviewing her hospital records including review of Epic records-and coordinating and formulating a plan of care-of note greater than 50% of time spent coordinating plan of care  .      Marland Kitchen

## 2013-12-14 ENCOUNTER — Non-Acute Institutional Stay (SKILLED_NURSING_FACILITY): Payer: Medicare Other | Admitting: Internal Medicine

## 2013-12-14 DIAGNOSIS — R269 Unspecified abnormalities of gait and mobility: Secondary | ICD-10-CM

## 2013-12-14 DIAGNOSIS — S12100A Unspecified displaced fracture of second cervical vertebra, initial encounter for closed fracture: Secondary | ICD-10-CM

## 2013-12-14 DIAGNOSIS — G309 Alzheimer's disease, unspecified: Secondary | ICD-10-CM

## 2013-12-14 DIAGNOSIS — F0280 Dementia in other diseases classified elsewhere without behavioral disturbance: Secondary | ICD-10-CM

## 2013-12-14 NOTE — Progress Notes (Signed)
Patient ID: Cynthia [name], female   DOB: 1924-11-03, 80 y.o.   MRN: [id number]  Facility; [facility] Chief complaint; admission to SNF post admit to [place] from 2/17 to 2/19  History; this is a patient who apparently suffered a fall in her car port. Circumstance of this fall are not really clear as the patient really doesn't remember much about it. She tells me that she has a walker although it is unclear whether she was really using it with any regularity. Nor is it really clear about her fall history. In any case she suffered a fracture of C2 with an epidural hematoma. There was also a C1 fracture. She has been placed in an [employer]. Patient had a laceration to the forehead which was repaired. She had a 2-D echocardiogram as there was some question about whether this was a syncopal event . This was reasonably unremarkable. She had a urinalysis that showed many bacteria and she was given antibiotics however I don't see a culture result, or even another culture was actually done.  The most significant other issues seems to be dementia. It is quoted in the chart that she has severe memory loss especially short-term. There is a suggestion from her daughter that she will need ALF placement.  Past Medical History  Diagnosis Date  . Breast cancer   . Dementia      Past Surgical History  Procedure Laterality Date  . Colon surgery    . Mastectomy, partial Right     Current Outpatient Prescriptions on File Prior to Visit  Medication Sig Dispense Refill  . cefUROXime (CEFTIN) 250 MG tablet Take 1 tablet (250 mg total) by mouth 2 (two) times daily with a meal.  10 tablet  0  . HYDROcodone-acetaminophen (NORCO/VICODIN) 5-325 MG per tablet Take one tablet by mouth every 6 hours as needed for pain, MAX APAP-3 GM/24 HRS FROM ALL SOURCES  120 tablet  0   No current facility-administered medications on file prior to visit.  Social history; patient was apparently living on her own she claims to be  independent in ADLs IADLs.  reports that she has never smoked. She does not have any smokeless tobacco history on file. She reports that she does not drink alcohol or use illicit drugs.  Review of systems Respiratory no cough no sputum Cardiac no chest pain GI no abdominal pain no diarrhea GU no clear dysuria Musculoskeletal some neck pain however this does not appear to be significant  Physical examination Gen. patient is not in any distress HEENT she has bruising on the right greater than left forehead superficial tiny laceration on the right side of her forehead Respiratory; clear entry bilaterally Cardiac heart sounds are normal there is no murmurs Abdomen no liver no spleen no tenderness GU bladder is not distended there is no costovertebral angle tenderness Musculoskeletal she is in a cervical collar. Obscure arthritic changes of both knees Neurologic; she has brisk knee jerks bilaterally however no other relevant findings. She is able to bring herself to a standing position and walks with a reasonably normal looking gait. Balance is slightly reduced Mental status; patient is here resistant to cognitive-type questions. However I suspect there is significant problems here  Impression/plan #1 C2 fracture with epidural hematoma C1 fracture [see report below]. She has a collar in place. I am doubtful this patient would be able to manage this at home #2 fall/syncope; I really wasn't able to get any more history from the patient and the  hospitalists obtained. Collateral information would be helpful #3 some degree of gait ataxia however this is mild #4 dementia which I think is probably significant. Olas for a Folstein Mini-Mental Status. She would probably be a candidate for appropriate pharmacologic therapy.   Results for Cynthia [name], Cynthia [name] (MRN [id number]) as of 12/14/2013 08:16  Ref. Range 12/09/2013 01:59 12/09/2013 02:33 12/09/2013 07:53 12/09/2013 08:52  Sodium Latest Range: 137-147  mEq/L  143    Potassium Latest Range: 3.7-5.3 mEq/L  4.1    Chloride Latest Range: 96-112 mEq/L  106    CO2 Latest Range: 19-32 mEq/L  21    BUN Latest Range: 6-23 mg/dL  18    Creatinine Latest Range: 0.50-1.10 mg/dL  0.86    Calcium Latest Range: 8.4-10.5 mg/dL  8.9    GFR calc non Af Amer Latest Range: >90 mL/min  59 (L)    GFR calc Af Amer Latest Range: >90 mL/min  69 (L)    Glucose Latest Range: 70-99 mg/dL  95    Alkaline Phosphatase Latest Range: 39-117 U/L  67    Albumin Latest Range: 3.5-5.2 g/dL  3.4 (L)    AST Latest Range: 0-37 U/L  18    ALT Latest Range: 0-35 U/L  6    Total Protein Latest Range: 6.0-8.3 g/dL  6.8    Total Bilirubin Latest Range: 0.3-1.2 mg/dL  0.4    Troponin I Latest Range: <0.30 ng/mL <0.30  <0.30   WBC Latest Range: 4.0-10.5 K/uL  9.2    RBC Latest Range: 3.87-5.11 MIL/uL  4.25    Hemoglobin Latest Range: 12.0-15.0 g/dL  13.5    HCT Latest Range: 36.0-46.0 %  40.1    MCV Latest Range: 78.0-100.0 fL  94.4    MCH Latest Range: 26.0-34.0 pg  31.8    MCHC Latest Range: 30.0-36.0 g/dL  33.7    RDW Latest Range: 11.5-15.5 %  14.5    Platelets Latest Range: 150-400 K/uL  240    2D ECHOCARDIOGRAM WITHOUT CONTRAST No range found    Rpt                           [address], [place] [address]                         [phone]   ------------------------------------------------------------ Transthoracic Echocardiography  Patient:    Cynthia [name], Cynthia [name] MR #:       [id number] Study Date: 12/09/2013 Gender:     F Age:        23 Height:     160cm Weight:     58.1kg BSA:        1.34m^2 Pt. Status: Room:       A315    SONOGRAPHER  Lina Sar, RDCS  ADMITTING    Iraq, Consuello Closs, Gagan  REFERRING    Iraq, Gagan  ATTENDING    Horton, Courtney  PERFORMING   Chmg, Forestine Na cc:  ------------------------------------------------------------ LV EF: 65% -    70%  ------------------------------------------------------------ Indications:      Syncope 780.2.  ------------------------------------------------------------ History:   PMH:  No prior cardiac history.  Risk factors: History of breast cancer, dementia  ------------------------------------------------------------ Study Conclusions  - Study data: Technically difficult study. - Left ventricle: The  cavity size was normal. Wall thickness   was normal. Systolic function was vigorous. The estimated   ejection fraction is >70%. There is a mild midcavitary   gradient caused by systolic cavity obliteration. Wall   motion was normal; there were no regional wall motion   abnormalities. Doppler parameters are consistent with   abnormal left ventricular relaxation (grade 1 diastolic   dysfunction). - Aortic valve: Mildly calcified annulus. Trileaflet; mildly   thickened leaflets. - Mitral valve: Mildly calcified annulus. Mildly thickened   leaflets . - Atrial septum: The is an atrial septal aneurysm that is   mobile, projecting back and forth between the left and   right atirum. There is no clear evidence of interatrial   shunting by color Doppler. - Systemic veins: The IVC is small consistent with low RA   pressures and hypovolemia. - Pericardium, extracardiac: There is a small pericardial   effusion adjacent to the RV free wall. Transthoracic echocardiography.  M-mode, complete 2D, spectral Doppler, and color Doppler.  Height:  Height: 160cm. Height: 63in.  Weight:  Weight: 58.1kg. Weight: 127.8lb.  Body mass index:  BMI: 22.7kg/m^2.  Body surface area:    BSA: 1.68m^2.  Blood pressure:     97/59.  Patient status:  Inpatient.  Location:  Bedside.  ------------------------------------------------------------  ------------------------------------------------------------ Left ventricle:  The cavity size was normal. Wall thickness was normal. Systolic function was vigorous. The  estimated ejection fraction is >70%.There is a mild midcavitary gradient caused by systolic cavity obliteration. Wall motion was normal; there were no regional wall motion abnormalities. Doppler parameters are consistent with abnormal left ventricular relaxation (grade 1 diastolic dysfunction).  ------------------------------------------------------------ Aortic valve:   Mildly calcified annulus. Trileaflet; mildly thickened leaflets.  Doppler:   There was no stenosis.    No significant regurgitation.    VTI ratio of LVOT to aortic valve: 0.89. Valve area: 1.79cm^2(VTI). Indexed valve area: 1.12cm^2/m^2 (VTI). Peak velocity ratio of LVOT to aortic valve: 0.88. Valve area: 1.77cm^2 (Vmax). Indexed valve area: 1.11cm^2/m^2 (Vmax).    Mean gradient: 73mm Hg (S). Peak gradient: 26mm Hg (S).  ------------------------------------------------------------ Aorta:  Aortic root: The aortic root was normal in size.  ------------------------------------------------------------ Mitral valve:   Mildly calcified annulus. Mildly thickened leaflets .  Doppler:   There was no evidence for stenosis.  No significant regurgitation.    Peak gradient: 67mm Hg (D).   ------------------------------------------------------------ Left atrium:  The atrium was normal in size.  ------------------------------------------------------------ Atrial septum:  The is an atrial septal aneurysm that is mobile, projecting back and forth between the left and right atirum. There is no clear evidence of interatrial shunting by color Doppler.  ------------------------------------------------------------ Right ventricle:  The cavity size was normal. Systolic function was normal. RV TAPSE is 2.1 cm.  ------------------------------------------------------------ Pulmonic valve:   Not well visualized.  Doppler:   There was no evidence for stenosis.    No significant regurgitation.    ------------------------------------------------------------ Tricuspid valve:   Structurally normal valve.   Leaflet separation was normal.  Doppler:   There was no evidence for stenosis.    No regurgitation.  ------------------------------------------------------------ Pulmonary artery:    Systolic pressure could not be accurately estimated.   Inadequate TR jet.  ------------------------------------------------------------ Right atrium:  The atrium was normal in size.  ------------------------------------------------------------ Pericardium:  There is a small pericardial effusion adjacent to the RV free wall.  ------------------------------------------------------------ Systemic veins:  The IVC is small consistent with low RA pressures and hypovolemia.    Study Result  CLINICAL DATA:  Head trauma secondary to a fall. Altered mental status. Dementia.   EXAM: CT CERVICAL SPINE WITHOUT CONTRAST   TECHNIQUE: Multidetector CT imaging of the cervical spine was performed without intravenous contrast. Multiplanar CT image reconstructions were also generated.   COMPARISON:  None.   FINDINGS: There is an oblique vertical fracture through the left lateral mass of C2. There is also a fracture through the anterior aspect of the right pedicle of C2. There is an epidural hematoma at the C1-2 level to the left of midline extending anterior and posterior to the spinal cord with compression of the thecal sac and minimal compression of the spinal cord at the C2-3 level. This hematoma extends along the left lamina of C3 to the level of the low C3-4 disc space. The anterior component of the epidural hematoma extends to the C5-6 level but this is only 3 mm in AP dimension.   There is also a slightly displaced fracture of left side of the posterior arch of C1. I cannot identify a second fracture of the ring of C1.   The patient has auto fusion of the C3-4 level. There is  degenerative disc space narrowing at C4-5 through C6-7 with multilevel moderate facet arthritis from C2-3 through T2-3 on the right and at C2-3, C4-5, and T1-2 on the left.   There is no subluxation.   IMPRESSION: 1. Fractures of the left lateral mass and right pedicle of C2 with anterior and left lateral and left posterior lateral epidural hematoma with a slight mass effect upon the thecal sac and spinal cord at C2. No subluxation at the site of the fractures. 2. Single visible fracture of the left side of the posterior arch of C1.     Electronically Signed   By: Rozetta Nunnery M.D.   On: 12/08/2013 18:23

## 2013-12-17 ENCOUNTER — Ambulatory Visit (HOSPITAL_COMMUNITY)
Admission: RE | Admit: 2013-12-17 | Discharge: 2013-12-17 | Disposition: A | Discharge status: Home / Self Care | Payer: Medicare Other | Source: Ambulatory Visit | Attending: Cardiology | Admitting: Cardiology

## 2013-12-17 DIAGNOSIS — R55 Syncope and collapse: Secondary | ICD-10-CM | POA: Insufficient documentation

## 2013-12-17 NOTE — Progress Notes (Signed)
24hr holter monitor in progress.

## 2013-12-18 ENCOUNTER — Non-Acute Institutional Stay (SKILLED_NURSING_FACILITY): Payer: Medicare Other | Admitting: Internal Medicine

## 2013-12-18 DIAGNOSIS — G309 Alzheimer's disease, unspecified: Principal | ICD-10-CM

## 2013-12-18 DIAGNOSIS — F0280 Dementia in other diseases classified elsewhere without behavioral disturbance: Secondary | ICD-10-CM

## 2013-12-21 NOTE — Progress Notes (Addendum)
Patient ID: Cynthia [name], female   DOB: 1925/06/20, 80 y.o.   MRN: [id number]                 PROGRESS NOTE  DATE:  12/18/2013    FACILITY: [facility]    LEVEL OF CARE:   SNF   Acute Visit   CHIEF COMPLAINT:  Follow up mental status and other issues.    HISTORY OF PRESENT ILLNESS:  This is a lady whom I admitted to the facility last week.  She had a fall and suffered a C2 fracture with an epidural hematoma.  There was also a C1 fracture.    There was some question about whether she had a syncopal event.  She had a 2D-echocardiogram that was negative.  She now has a cardiac event monitor on as directed by Dr. Gwenlyn Found of [facility].    When we admitted her, we noted the diagnosis of dementia.  There was also a suggestion that she would need ALF placement after she recovers from her fractures.  A Folstein mini-mental status done in the facility shows a score of 16/30.    PHYSICAL EXAMINATION:    VITAL SIGNS:   PULSE:  80.   RESPIRATIONS:  18.   O2 SATURATIONS:  94% on room air.   GENERAL APPEARANCE:  The patient is not in any distress.   CHEST/RESPIRATORY:  Clear air entry bilaterally.   CARDIOVASCULAR:  CARDIAC:   Heart sounds are regular.  There are no murmurs.   PSYCHIATRIC:   MENTAL STATUS:   She was able to get the year by looking at the calendar.  She cannot even give me a rough estimate of how long she has been in this building.    ASSESSMENT/PLAN:  Dementia.  This is moderate by testing.  This is, nevertheless, severe enough that it would preclude her from living independently.  I am going to start her on Aricept.    Syncope.  She currently has a cardiac event monitor on.

## 2013-12-22 ENCOUNTER — Telehealth: Payer: Self-pay

## 2013-12-22 NOTE — Telephone Encounter (Signed)
Gave holter results to nurse Tyrell Antonio at [facility]

## 2013-12-22 NOTE — Telephone Encounter (Signed)
Message copied by Bernita Raisin on Tue Dec 22, 2013 12:14 PM ------      Message from: Carlyle Dolly F      Created: Tue Dec 22, 2013 11:22 AM       Please let patient know that heart monitor was normal            Carlyle Dolly MD ------

## 2013-12-30 ENCOUNTER — Encounter: Payer: Medicare Other | Admitting: Cardiology

## 2014-01-01 ENCOUNTER — Encounter: Payer: Self-pay | Admitting: *Deleted

## 2014-01-05 ENCOUNTER — Other Ambulatory Visit: Payer: Self-pay | Admitting: *Deleted

## 2014-01-05 MED ORDER — HYDROCODONE-ACETAMINOPHEN 5-325 MG PO TABS: ORAL_TABLET | ORAL | Status: DC

## 2014-01-05 NOTE — Telephone Encounter (Signed)
[facility] 

## 2014-01-07 ENCOUNTER — Encounter: Payer: Self-pay | Admitting: Cardiology

## 2014-01-07 ENCOUNTER — Inpatient Hospital Stay: Admit: 2014-01-07 | Payer: Medicare Other

## 2014-01-18 ENCOUNTER — Inpatient Hospital Stay
Admit: 2014-01-18 | Discharge: 2014-01-18 | Disposition: A | Discharge status: Home / Self Care | Payer: Medicare Other | Attending: Neurosurgery | Admitting: Neurosurgery

## 2014-01-18 ENCOUNTER — Inpatient Hospital Stay: Admit: 2014-01-18 | Payer: Medicare Other

## 2014-01-25 ENCOUNTER — Ambulatory Visit (HOSPITAL_COMMUNITY): Payer: No Typology Code available for payment source | Attending: Internal Medicine

## 2014-01-25 ENCOUNTER — Encounter: Payer: Medicare Other | Admitting: Cardiology

## 2014-01-25 DIAGNOSIS — W19XXXA Unspecified fall, initial encounter: Secondary | ICD-10-CM | POA: Insufficient documentation

## 2014-01-25 DIAGNOSIS — M533 Sacrococcygeal disorders, not elsewhere classified: Secondary | ICD-10-CM | POA: Insufficient documentation

## 2014-01-25 DIAGNOSIS — M25559 Pain in unspecified hip: Secondary | ICD-10-CM | POA: Insufficient documentation

## 2014-02-05 ENCOUNTER — Non-Acute Institutional Stay (SKILLED_NURSING_FACILITY): Payer: Medicare Other | Admitting: Internal Medicine

## 2014-02-05 DIAGNOSIS — R55 Syncope and collapse: Secondary | ICD-10-CM

## 2014-02-05 DIAGNOSIS — F0390 Unspecified dementia without behavioral disturbance: Secondary | ICD-10-CM

## 2014-02-05 DIAGNOSIS — D126 Benign neoplasm of colon, unspecified: Secondary | ICD-10-CM

## 2014-02-05 DIAGNOSIS — S12100A Unspecified displaced fracture of second cervical vertebra, initial encounter for closed fracture: Secondary | ICD-10-CM

## 2014-02-05 NOTE — Progress Notes (Signed)
Patient ID: Cynthia [name], female   DOB: [birth year], 80 y.o.   MRN: [id number]     This is an acute question discharge note  Level of care skilled.  Facility [facility] .  Ch complaint-visit prior to possible discharge.   History of present illness.  Patient is a pleasant 80 year old female with a past medical history of breast cancer and dementia was brought to the emergency department on February 17 after she was found by the plumber in her carport with a hematoma to the forehead as well as a laceration.  Patient could not recall whether she fell or passed out she does have a history of dementia.  She remained alert and oriented.  She was found to have a C2 cervical fracture with epidermal hematoma-C1 fracture-EKG did not show anything remarkable cardiac enzymes were negative her laceration was treated in the ER and appears to have resolved.  Neurosurgery was consulted and recommended nonoperative management of the cervical fractures.  She does continue on an Aspen collar and is followed by neurosurgery   She has also completed an antibiotic for UTI  Patient is tentatively scheduled for discharge this weekend-she will need 24-hour care and apparently this is being arranged although apparently not quite finalized  She does have a history of dementia and there certainly concerned that going home by herself certainly would not be a good situation.--She has been started on Aricept  She will need continued physical therapy for her weakness as well as cervical neck fracture as well as home health and nursing to monitor this.  Currently her pain appears to be controlled on hydrocodone-        Previous medical history.  History of cervical neck fracture status post fall.  UTI.  Dementia.  History of breast cancer  History of benign colonic neoplasm   Surgical history.  April 2008-right modified radical mastectomy secondary to ductal carcinoma right breast.  2006-right hemicolectomy--  secondary to what was diagnosed as a benign colonic neoplasm   Hospital studies.  2- 17 2015.  CT of the head without contrast-no acute intracranial abnormality scalp hematoma and laceration over the right frontal bone  CT cervical spine without contrast-showed fractures of the left lateral mass and right pedicle of C2 with anterior and left lateral and left posterior lateral epidural hematoma with a slight mass effect upon the thecal sac and spinal cord at C2-no subluxation at the site of the fractures-there is a single visible fracture of the left side of the posterior arch of C1.  2- 17 2015-pelvic x-ray.  No definite acute traumatic injury to the bony pelvis.  Chest x-ray-no radiographic evidence of acute cardiopulmonary disease  2-D echo-.  Showed a left ventricular ejection fraction of greater than 83%-TDVVO 1 diastolic dysfunction   Medications  Reviewed per [facility] they include Hydrocodone 5-325 mg 1 tab daily 6 hours when necessary pain  .  History-limited information about patient does not have any smoking history no smokeless tobacco history on file-no history of alcohol or illicit drug use.   Apparently she had been living alone in her house in the [place] area in nature she sustained a fall .  Family history is not available from any current source .  Review of systems.  In general is not complaining of any fever or chills.  Skin-hematoma appears largely resolved  Head ears eyes nose mouth and throat-does not complaining of any visual changes she has prescription lenses does not complain of any blurred vision.  Respiratory no complaints of shortness of breath or cough  Cardiac-no complaints of chest pain.  GI denies any abdominal pain no nausea vomiting diarrhea or constipation as been noted.  GU been treated for UTI --is not complaining of dysuria  Muscle skeletal-does not really complain of pain today .  Neurologic does not complaining of any headache or dizziness does  have a history of cervical spine fracture as noted above but did not complain of any numbness tingling  Psych she does have some history of dementia as noted above did not appear anxious.   Physical exam.  t- 97.7 pulse 65 respirations 18 blood pressure 122/75  In general this is a pleasant frail-appearing elderly female in no distress sitting comfortably in her wheelchair.  Her skin is warm and dry  .  Eyes -visual acuity appears grossly intact she does have prescription lenses  Neck-- does have a cervical collar in place.  Oropharynx is clear mucous membranes moist.  Chest is clear to auscultation there is no labored breathing  Heart is regular rate and rhythm without murmur gallop or rub she does not really have significant lower extremity edema.  Abdomen is soft nontender with positive bowel sounds.  Muscle skeletal is able to move all extremities with appropriate grip strength and range of motion --I do not note any deformities other than arthritic changes She is able to walk but requires assistance she will need a rolling walker apparently she does have one at home.  Neurologic as appears grossly intact no lateralizing findings cranial nerves appear grossly intact-strength in all 4 extremities appears appropriate.  Psych --does appear to have some cognitive deficits she is alert and appropriate although apparently has significant confusion which would preclude her going home alone  .  Labs 12/21/2013.  Sodium 141 potassium 4.2 BUN 12 creatinine 0.89 12/14/2013.  WBC 6.7 hemoglobin 12.3 platelets 231.  Sodium 143 potassium 3.9 BUN 18 creatinine 0.91.  12/09/2013.  Sodium 143 potassium 4.1 BUN 18 creatinine 0.86.  Liver function tests within normal limits.  WBC 9.2 hemoglobin 13.5 platelets 240.   Assessment and plan.  #1 history of cervical neck fracture-this is followed by neurosurgery recommendation for Aspen collar -at this point pain appears to be controlled with the  hydrocodone  she does appear comfortable today-neurologically appears to be intact.--  #2-UTI- This appears resolved #3-dementia-this appears actually to be fairly significant--she has been started on Aricept  --Again I believe 24-hour care would be appropriate.  #4-history of breast cancer she is status post right modified radical mastectomy --apparently this has been stable for some time this had been followed by Dr. Gerarda Fraction  5-history of right Hemicolectomy-this was apparently due to a benign colonic neoplasm--- apparently this has not been an issue in some time.  #6 status question history hypertension-I this appears stable currently most recent blood pressure is 122/75-105/53 .  #7-question history syncope-apparently patient denies this I see per hospital discharge note this was discussed with f cardiology--she has had a  cardiac monitor that is followed by cardiology r    Of note would like to update a CBC and basic metabolic  before discharge.  She also again will need home health nursing support for her medical issues with the cervical neck fracture-as well as weakness she would benefit from PT and OT.  Also will have apparently 24-hour care with a private sitter however again this is somewhat up in the air now and may delay her discharge-   [id number] note  greater than 30 minutes spent on this discharge summary-greater than 50% of time spent coordinating plan of care    Of note we'll update laboratory including a CBC a metabolic panel next week      And is followed by neurosurgery

## 2014-02-07 ENCOUNTER — Encounter: Payer: Self-pay | Admitting: Internal Medicine

## 2014-02-18 ENCOUNTER — Encounter: Payer: Self-pay | Admitting: Cardiology

## 2014-02-18 ENCOUNTER — Ambulatory Visit (INDEPENDENT_AMBULATORY_CARE_PROVIDER_SITE_OTHER): Payer: Medicare Other | Admitting: Cardiology

## 2014-02-18 VITALS — BP 115/50 | HR 69 | Ht 62.0 in | Wt 116.0 lb

## 2014-02-18 DIAGNOSIS — W19XXXA Unspecified fall, initial encounter: Secondary | ICD-10-CM

## 2014-02-18 DIAGNOSIS — R55 Syncope and collapse: Secondary | ICD-10-CM

## 2014-02-18 NOTE — Patient Instructions (Signed)
Your physician recommends that you schedule a follow-up appointment only if needed      Thank you for choosing [facility] !         

## 2014-02-18 NOTE — Progress Notes (Signed)
Clinical Summary Cynthia [name] is a 80 y.o.female seen today as a new patient for possible syncope  1. Fall/ Possible syncope - recent admit 11/2013, patient found laying in [place] with facial injury, could not recall how she fell or if she passed out. - developed C2 cervical fx and epidural hematoma, managed conservatively - no clear cause of possible syncope per hospital workup - EKG normal sinus rhythm - echo with normal LVEF, no significant valve disease. Small IVC suggestive of hypovolemia, per notes she was also "somewhat orthostatic". She had a UTI at the time as well  - since discharge has no lightheadness, no dizziness, no syncope. Denies any palpitations. Denies any chest pain. She complete a 24 hr holter monitor with no events.  Past Medical History  Diagnosis Date  . Breast cancer   . Dementia   . Urinary tract infection, site not specified   . Difficulty in walking(719.7)   . Muscle weakness (generalized)   . Cognitive communication deficit      No Known Allergies   Current Outpatient Prescriptions  Medication Sig Dispense Refill  . HYDROcodone-acetaminophen (NORCO/VICODIN) 5-325 MG per tablet Take one tablet by mouth every 6 hours as needed for pain, MAX APAP-3 GM/24 HRS FROM ALL SOURCES  120 tablet  0   No current facility-administered medications for this visit.     Past Surgical History  Procedure Laterality Date  . Colon surgery    . Mastectomy, partial Right      No Known Allergies    No family history on file.   Social History Cynthia [name] reports that she has never smoked. She does not have any smokeless tobacco history on file. Cynthia [name] reports that she does not drink alcohol.   Review of Systems CONSTITUTIONAL: No weight loss, fever, chills, weakness or fatigue.  HEENT: Eyes: No visual loss, blurred vision, double vision or yellow sclerae.No hearing loss, sneezing, congestion, runny nose or sore throat.  SKIN: No rash or itching.    CARDIOVASCULAR: per HPI RESPIRATORY: No shortness of breath, cough or sputum.  GASTROINTESTINAL: No anorexia, nausea, vomiting or diarrhea. No abdominal pain or blood.  GENITOURINARY: No burning on urination, no polyuria NEUROLOGICAL: No headache, dizziness, syncope, paralysis, ataxia, numbness or tingling in the extremities. No change in bowel or bladder control.  MUSCULOSKELETAL: No muscle, back pain, joint pain or stiffness.  LYMPHATICS: No enlarged nodes. No history of splenectomy.  PSYCHIATRIC: No history of depression or anxiety.  ENDOCRINOLOGIC: No reports of sweating, cold or heat intolerance. No polyuria or polydipsia.  Marland Kitchen   Physical Examination p 69 bp 115/50 Wt 116 lbs BMI 21 Gen: resting comfortably, no acute distress HEENT: no scleral icterus, pupils equal round and reactive, no palptable cervical adenopathy,  CV: RRR, no m/r/g. Neck brace cannot eval carotids or JVD Resp: Clear to auscultation bilaterally GI: abdomen is soft, non-tender, non-distended, normal bowel sounds, no hepatosplenomegaly MSK: extremities are warm, no edema.  Skin: warm, no rash Neuro:  no focal deficits Psych: appropriate affect   Diagnostic Studies  12/2013 24 hour holter No arrhythmias  11/2013 Echo - Study data: Technically difficult study. - Left ventricle: The cavity size was normal. Wall thickness was normal. Systolic function was vigorous. The estimated ejection fraction is >70%. There is a mild midcavitary gradient caused by systolic cavity obliteration. Wall motion was normal; there were no regional wall motion abnormalities. Doppler parameters are consistent with abnormal left ventricular relaxation (grade 1 diastolic dysfunction). - Aortic valve:  Mildly calcified annulus. Trileaflet; mildly thickened leaflets. - Mitral valve: Mildly calcified annulus. Mildly thickened leaflets . - Atrial septum: The is an atrial septal aneurysm that is mobile, projecting back and forth  between the left and right atirum. There is no clear evidence of interatrial shunting by color Doppler. - Systemic veins: The IVC is small consistent with low RA pressures and hypovolemia. - Pericardium, extracardiac: There is a small pericardial effusion adjacent to the RV free wall.   Assessment and Plan  1. Fall/Possible syncope - events remain unclear, whether she had a potential mechanical fall or possible syncopal episode - no recurrent symptoms - no clear cardiac etiology for syncope from workup - no further testing at this time, if recurrence of symptoms consider longer cardiac monitoring.    Follow up as needed  Arnoldo Lenis, M.D., F.A.C.C.

## 2014-03-03 ENCOUNTER — Ambulatory Visit (HOSPITAL_COMMUNITY): Payer: Medicare Other

## 2014-03-09 ENCOUNTER — Other Ambulatory Visit: Payer: Self-pay | Admitting: *Deleted

## 2014-03-09 MED ORDER — HYDROCODONE-ACETAMINOPHEN 5-325 MG PO TABS: ORAL_TABLET | ORAL | Status: DC

## 2014-03-09 NOTE — Telephone Encounter (Signed)
[facility] 

## 2014-03-22 ENCOUNTER — Ambulatory Visit (HOSPITAL_COMMUNITY)
Admit: 2014-03-22 | Discharge: 2014-03-22 | Disposition: A | Discharge status: Home / Self Care | Payer: Medicare Other | Source: Ambulatory Visit | Attending: Neurosurgery | Admitting: Neurosurgery

## 2014-03-22 ENCOUNTER — Ambulatory Visit (HOSPITAL_COMMUNITY): Payer: Medicare Other

## 2014-03-22 DIAGNOSIS — IMO0002 Reserved for concepts with insufficient information to code with codable children: Secondary | ICD-10-CM | POA: Insufficient documentation

## 2014-04-08 IMAGING — CT CT CERVICAL SPINE W/O CM
2 of 5 series · 8 of 20 positions shown, 10 images · non-contrast
Comparison: 12/08/2013

CLINICAL DATA: Followup fractures

EXAM:
CT CERVICAL SPINE WITHOUT CONTRAST
TECHNIQUE: Multidetector CT imaging of the cervical spine was performed without
intravenous contrast. Multiplanar CT image reconstructions were also
generated.

[Series 200: cor · coronal · 0.38mm/px · 1 of 40 slices shown]
[im 20/40  bone]
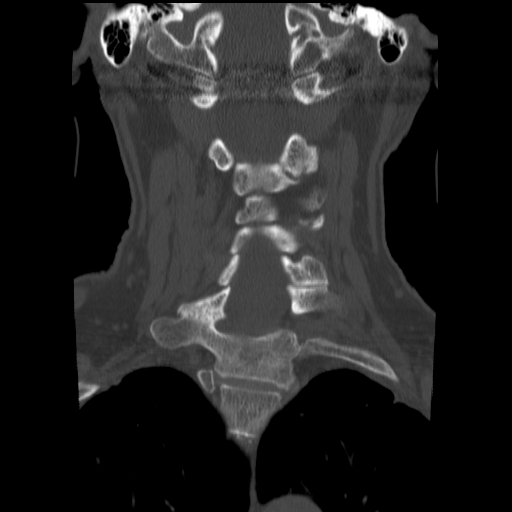

[Series 202: angled axial · axial · 0.20mm/px · z∈[-26,+108]mm · 7 of 261 slices shown, 9 images]
[im 33/261  soft-tissue]
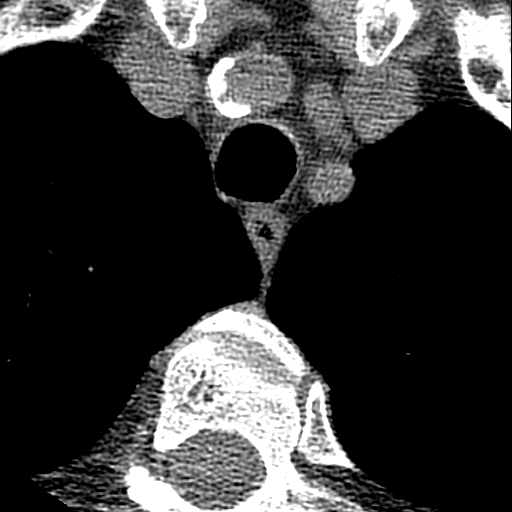
[im 33/261  bone]
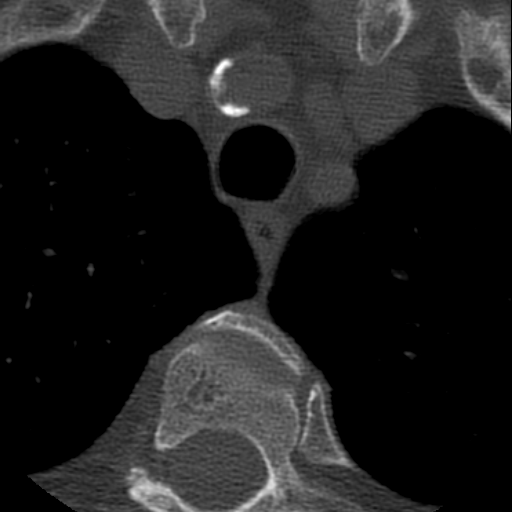
[im 66/261  bone]
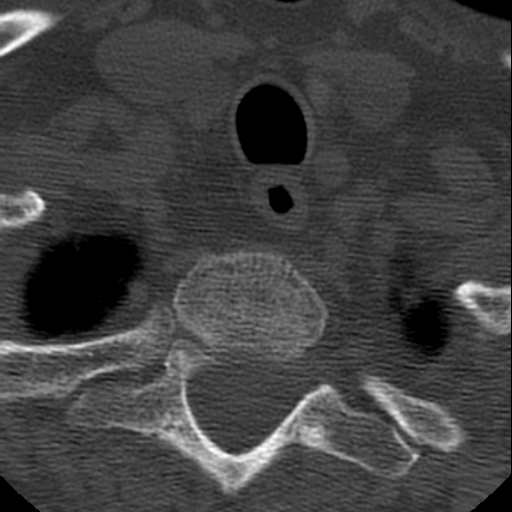
[im 98/261  bone]
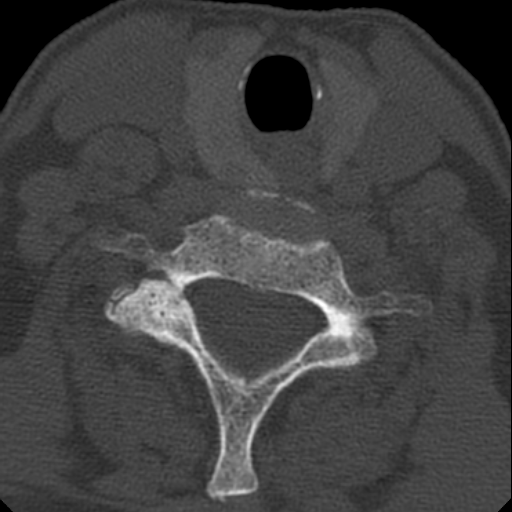
[im 131/261  bone]
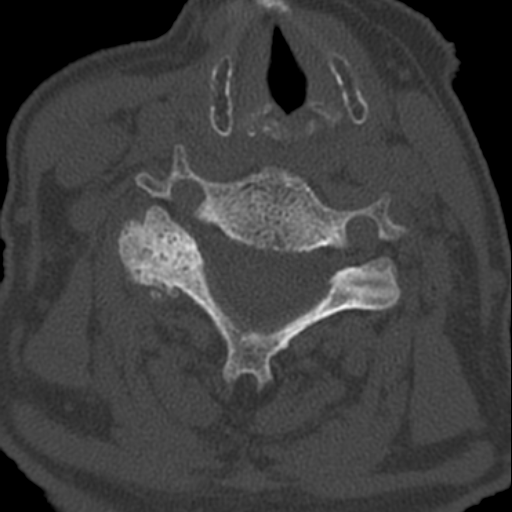
[im 163/261  soft-tissue]
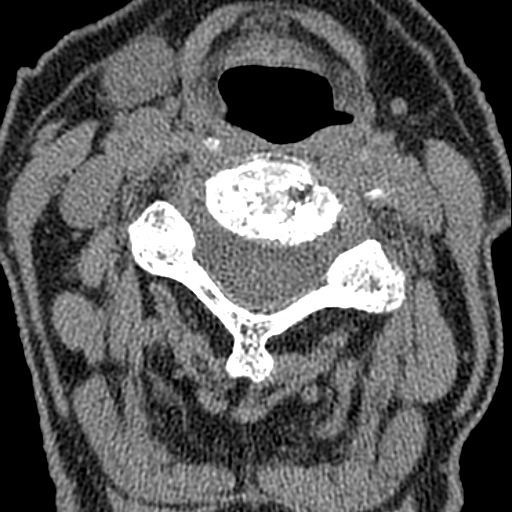
[im 163/261  bone]
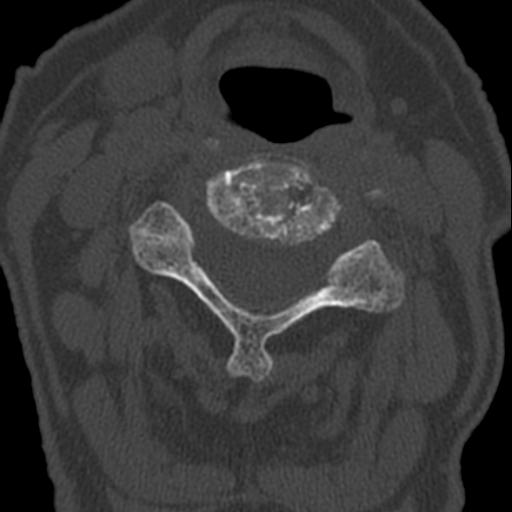
[im 196/261  bone]
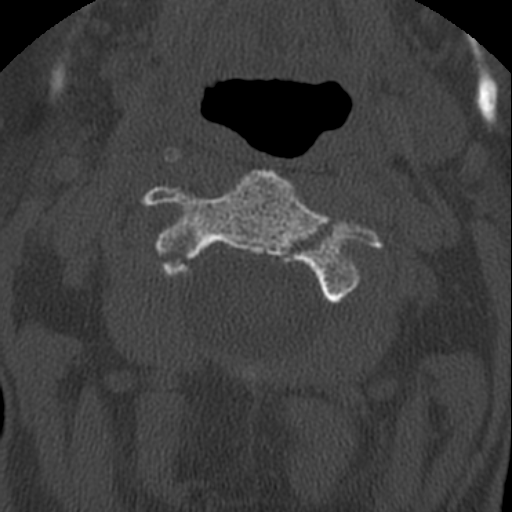
[im 228/261  bone]
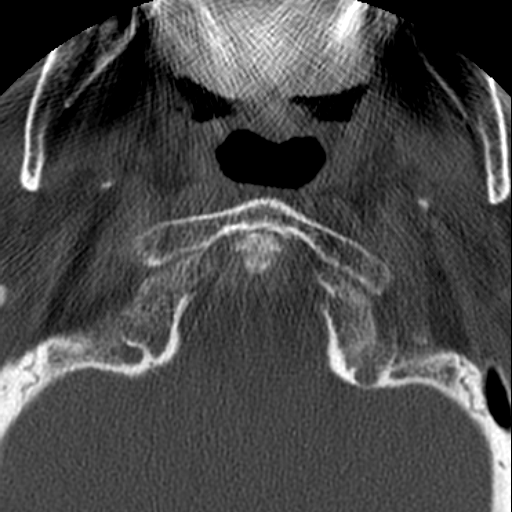

[8 of 20 positions shown; findings below may reference images not displayed]

FINDINGS: Fracture at the left arch of C1 is unchanged. Fracture line
involving the left lateral mass and posterior body of C2 is slightly
wider, consistent with the healing process at 5-6 weeks. No change
in position or alignment. Fracture line of the right pedicle of C2
is also slightly wider an now clearly visible as involving the
entire segment of that bone. Again, this is consistent with the
healing process at this time. No change of position or alignment. No
other cervical spine fracture.

Old fusion at C3-4 appears the same. Chronic facet degeneration at
C2-3 and C4-5 bilaterally appears the same. Facet degeneration on
the right at C5-6 is unchanged. Spondylosis at C6-7 is unchanged.

Previously seen epidural hematoma has resolved.
IMPRESSION: No change in position or alignment relative to the C1 and C2
fractures. Fracture lines appear slightly more distinct presently,
consistent with the acute healing phase at 5-6 weeks post injury.
Resorption of the previously seen epidural hematoma.

## 2014-04-15 IMAGING — CR DG HIP COMPLETE 2+V*R*
3 series · 3 of 3 positions shown · non-contrast
Comparison: None.

CLINICAL DATA: Fell 2 hr ago.  Pain.

EXAM:
RIGHT HIP - COMPLETE 2+ VIEW

[view not recorded (1 of 3)]
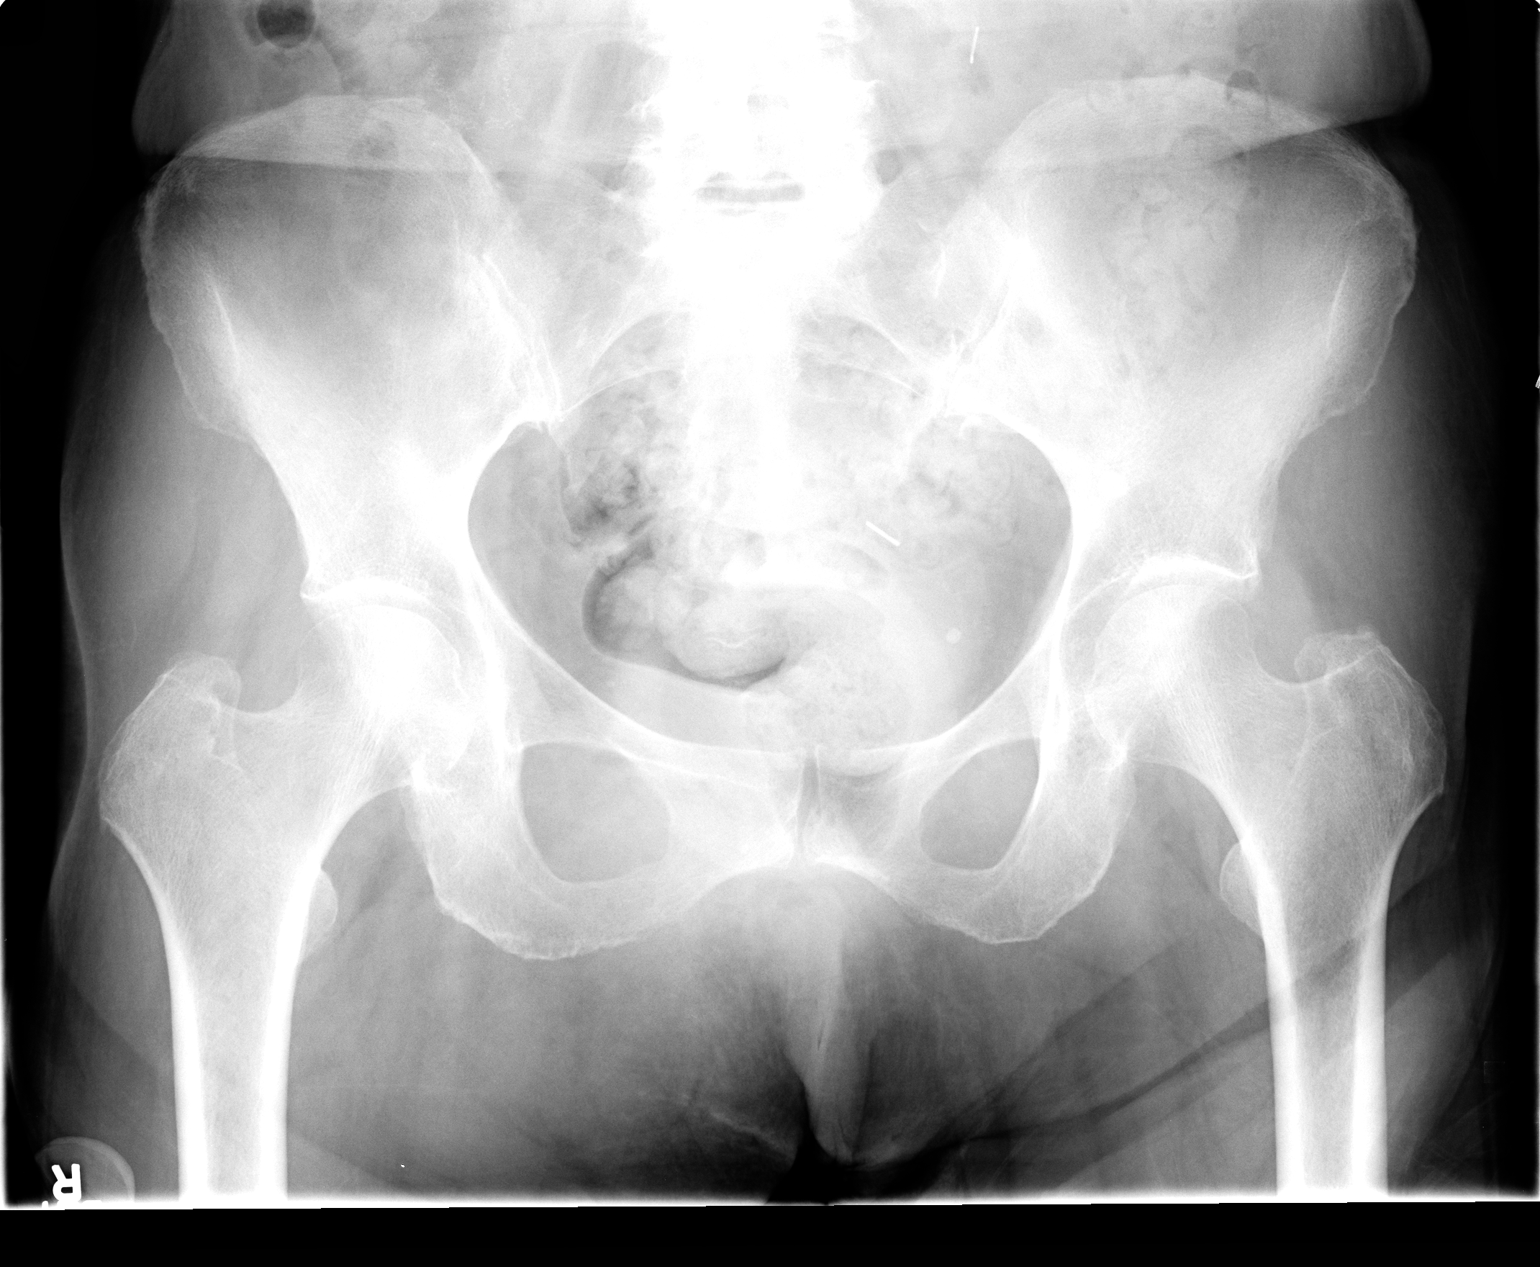

[view not recorded (2 of 3)]
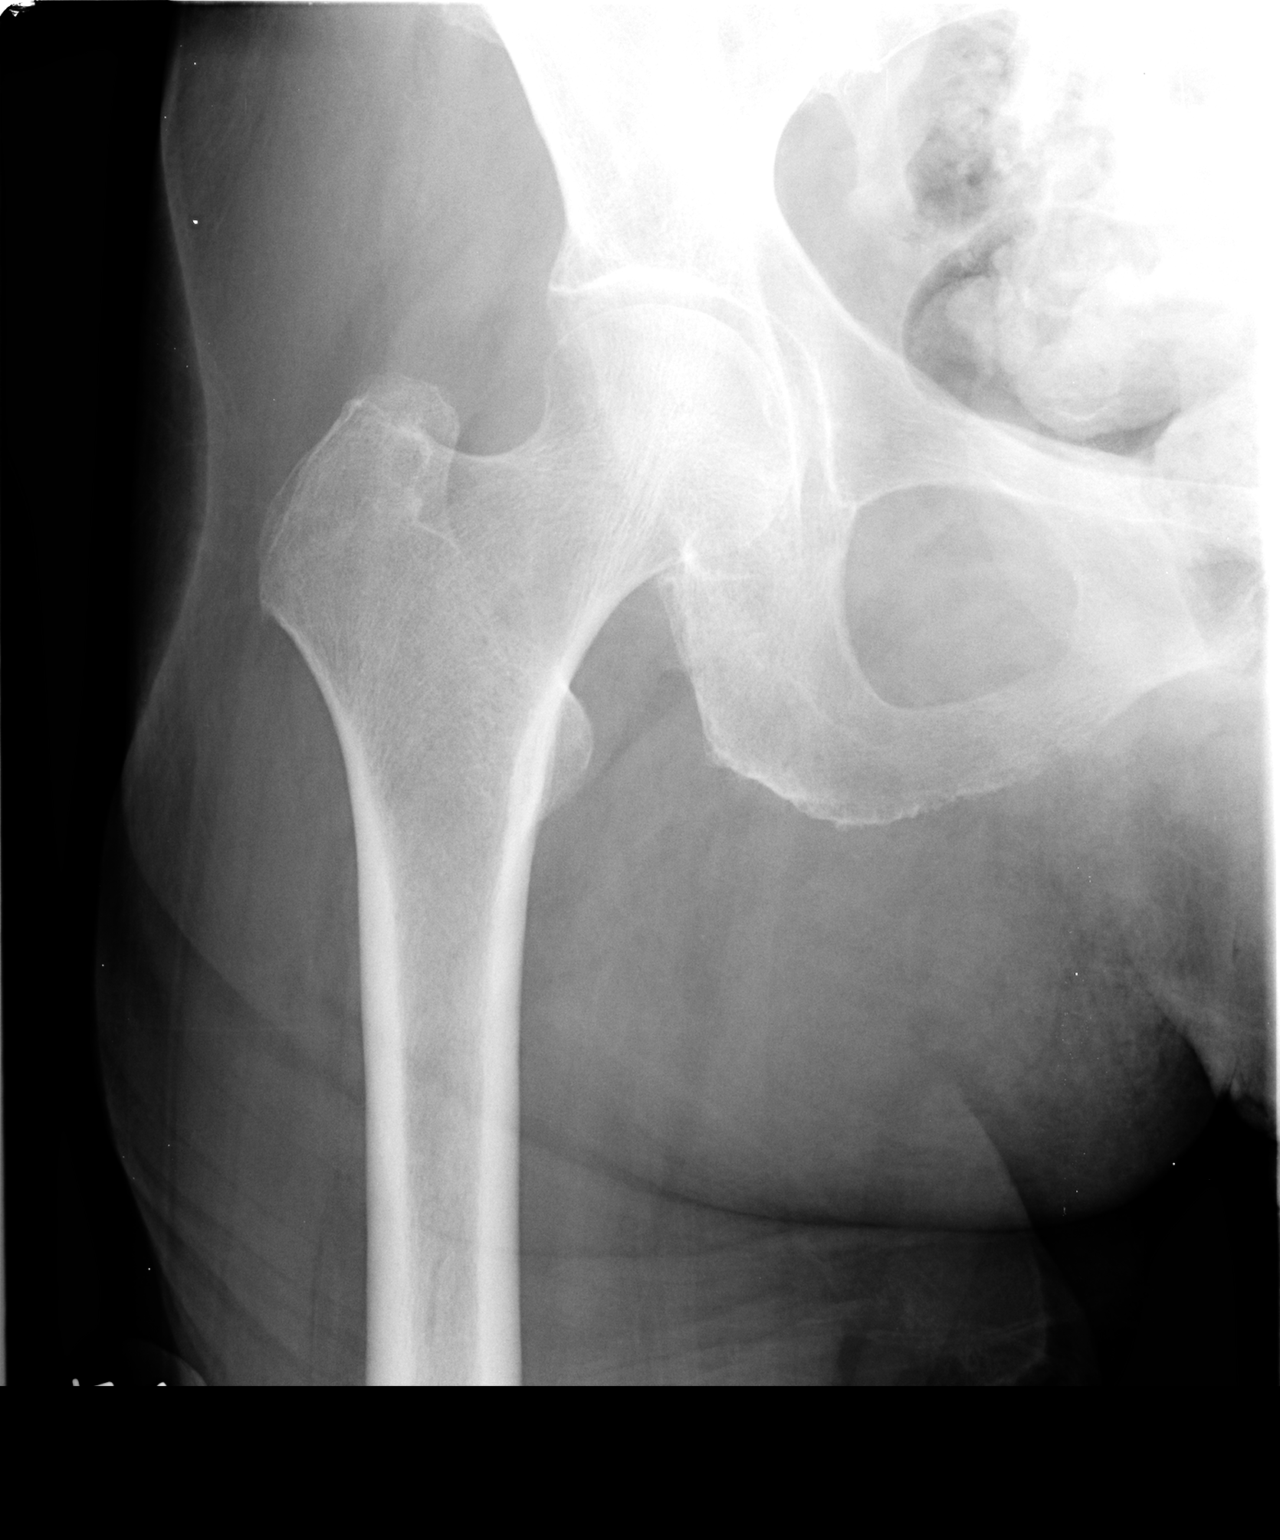

[view not recorded (3 of 3)]
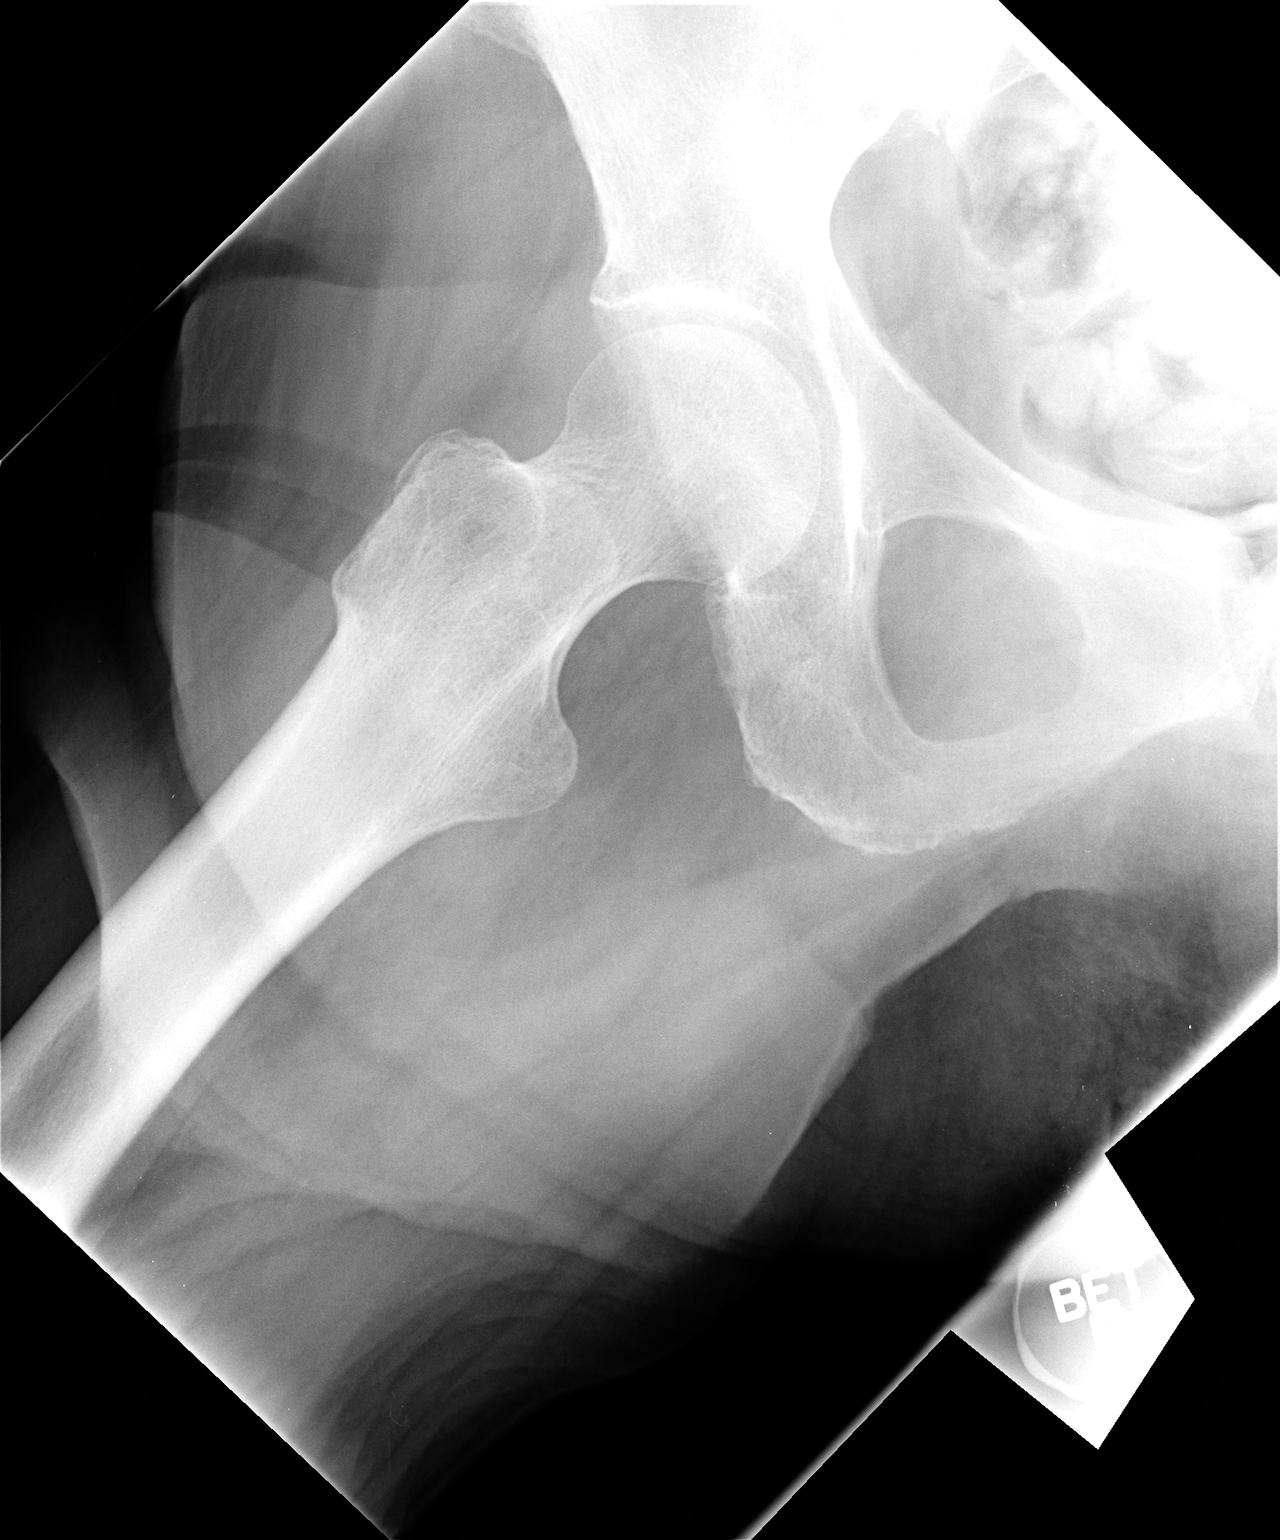

[3 of 3 positions shown; findings below may reference images not displayed]

FINDINGS: No fracture or bone lesion. The hip joints, SI joints and symphysis
pubis are normally space and aligned. No arthropathic change. Bones
are demineralized. Soft tissues are unremarkable.
IMPRESSION: No fracture or hip joint abnormality.

## 2014-05-04 ENCOUNTER — Encounter: Payer: Self-pay | Admitting: Internal Medicine

## 2014-05-04 ENCOUNTER — Non-Acute Institutional Stay (SKILLED_NURSING_FACILITY): Payer: Medicare Other | Admitting: Internal Medicine

## 2014-05-04 DIAGNOSIS — C50919 Malignant neoplasm of unspecified site of unspecified female breast: Secondary | ICD-10-CM

## 2014-05-04 DIAGNOSIS — IMO0002 Reserved for concepts with insufficient information to code with codable children: Secondary | ICD-10-CM

## 2014-05-04 DIAGNOSIS — F0390 Unspecified dementia without behavioral disturbance: Secondary | ICD-10-CM

## 2014-05-04 DIAGNOSIS — D126 Benign neoplasm of colon, unspecified: Secondary | ICD-10-CM

## 2014-05-04 DIAGNOSIS — S12100S Unspecified displaced fracture of second cervical vertebra, sequela: Secondary | ICD-10-CM

## 2014-05-04 NOTE — Progress Notes (Signed)
Patient ID: Cynthia [name], female   DOB: [birth year], 80 y.o.   MRN: [id number]   This is a routine visit  Level of care skilled.  Facility [facility]  .  Ch complaint-medical management of chronic medical issues including cervical neck fracture dementia question hypertension--also history of breast carcinoma  .  History of present illness.  Patient is a pleasant 80 year old female with a past medical history of breast cancer and dementia was brought to the emergency department on February  after she was found by the plumber in her carport with a hematoma to the forehead as well as a laceration.  Patient could not recall whether she fell or passed out she does have a history of dementia.  She remained alert and oriented.  She was found to have a C2 cervical fracture with epidermal hematoma-C1 fracture-EKG did not show anything remarkable cardiac enzymes were negative her laceration was treated in the ER and appears to have resolved.  Neurosurgery was consulted and recommended nonoperative management of the cervical fractures.  She has been followed by neurosurgery-at one point had a Aspen collar but this appears to have been discontinued that she's not really complaining of any neck pain or issues any numbness or tingling  Her other medical issues appear to be stable. She is here for long-term care-at 1 point there was some possibility of discharge but I suspect because patient was probably a poor candidate to be home alone especially with some history of dementia it was decided in her best interest for a higher level of care        -  Previous medical history.  History of cervical neck fracture status post fall.  UTI.  Dementia.  History of breast cancer  History of benign colonic neoplasm   Surgical history.  April 2008-right modified radical mastectomy secondary to ductal carcinoma right breast.  2006-right hemicolectomy-- secondary to what was diagnosed as a benign colonic neoplasm    Hospital studies.  2- 17 2015.  CT of the head without contrast-no acute intracranial abnormality scalp hematoma and laceration over the right frontal bone  CT cervical spine without contrast-showed fractures of the left lateral mass and right pedicle of C2 with anterior and left lateral and left posterior lateral epidural hematoma with a slight mass effect upon the thecal sac and spinal cord at C2-no subluxation at the site of the fractures-there is a single visible fracture of the left side of the posterior arch of C1.  2- 17 2015-pelvic x-ray.  No definite acute traumatic injury to the bony pelvis.  Chest x-ray-no radiographic evidence of acute cardiopulmonary disease  2-D echo-.  Showed a left ventricular ejection fraction of greater than 40%-JWJXB 1 diastolic dysfunction   Medications  Reviewed per [facility] e    .  History-limited information about patient does not have any smoking history no smokeless tobacco history on file-no history of alcohol or illicit drug use.  Apparently she had been living alone in her house in the [place] area in nature she sustained a fall  .  Family history is not available from any current source  .  Review of systems.  In general is not complaining of any fever or chills.  Skin-did not complain of any itching or rashes  Head ears eyes nose mouth and throat-does not complaining of any visual changes she has prescription lenses does not complain of any blurred vision.  Respiratory no complaints of shortness of breath or cough  Cardiac-no complaints of chest  pain.  GI denies any abdominal pain no nausea vomiting diarrhea or constipation a.  GU---hadin the past-- been treated for UTI --is not complaining of dysuria  Muscle skeletal-does not really complain of pain today--actually denies any neck pain  .  Neurologic does not complaining of any headache or dizziness does have a history of cervical spine fracture as noted above but did not complain of any  numbness tingling   Psych she does have some history of dementia as noted above did not appear anxious .  Physical exam.  Temperature 97.6 pulse 76 respirations 20 blood pressure 115/71 weight is 126.6    In general this is a pleasant frail-appearing elderly female in no distress sitting comfortably in her wheelchair.  Her skin is warm and dry  .  Eyes  -visual acuity appears grossly intact she does have prescription lenses  Neck-- appears to be supple with baseline range of motion --does not really show any deformity or pain e.  Oropharynx is clear mucous membranes moist.  Chest is clear to auscultation there is no labored breathing  Heart is regular rate and rhythm without murmur gallop or rub she has trace lower extremity edema bilaterally.  Abdomen is soft nontender with positive bowel sounds.  Muscle skeletal is able to move all extremities with appropriate grip strength and range of motion --I do not note any deformities other than arthritic changes     Neurologic as appears grossly intact no lateralizing findings cranial nerves appear grossly intact-s  Psych --does appear to have some cognitive deficits she is alert and appropriate--although confused can carry on a fairly basic conversation  .  Labs  02/06/2014.  WBC 7.0 hemoglobin 12.7 platelets 368.  Sodium 140 potassium 4.1 BUN 12 creatinine 0.84  12/21/2013.  Sodium 141 potassium 4.2 BUN 12 creatinine 0.89  12/14/2013.  WBC 6.7 hemoglobin 12.3 platelets 231.  Sodium 143 potassium 3.9 BUN 18 creatinine 0.91.  12/09/2013.  Sodium 143 potassium 4.1 BUN 18 creatinine 0.86.  Liver function tests within normal limits.  WBC 9.2 hemoglobin 13.5 platelets 240.   Assessment and plan .  #1 history of cervical neck fracture-this has been followed by neurosurgery in the past appears this is quite stable -- does not appear to be having any pain or neurologic issues- CT scan showed healed C1 left sided fracture and healing  C2-spine x-rays in June showed a stable exam with no instability with flexion or extension-    #2-dementia-this appears actually to be fairly significant--she has been started on Aricep classify this is probably moderate t  -  .  #3-history of breast cancer she is status post right modified radical mastectomy --apparently this has been stable for some time this had been followed by Dr. Madelyn Brunner write an order to see if there is further followup warranted   4-history of right Hemicolectomy-this was apparently due to a benign colonic neoplasm--- apparently this has not been an issue in some time .  #5 status question history hypertension-I this appears stable currently most recent blood pressure .  #6-question history syncope-apparently patient denies this I see per hospital discharge note this was discussed with  cardiology--and she actually had an office visit with conclusion of no aggressive workup unless symptoms persist and apparently she has been quite stable in this regard   of note would like update lab work including a CBC and CMP--to ensure baseline stability      [phone]

## 2014-07-21 ENCOUNTER — Encounter: Payer: Self-pay | Admitting: *Deleted

## 2014-08-18 ENCOUNTER — Encounter: Payer: Self-pay | Admitting: Internal Medicine

## 2014-08-18 ENCOUNTER — Non-Acute Institutional Stay (SKILLED_NURSING_FACILITY): Payer: Medicare Other | Admitting: Internal Medicine

## 2014-08-18 DIAGNOSIS — C50911 Malignant neoplasm of unspecified site of right female breast: Secondary | ICD-10-CM

## 2014-08-18 DIAGNOSIS — S12100S Unspecified displaced fracture of second cervical vertebra, sequela: Secondary | ICD-10-CM

## 2014-08-18 DIAGNOSIS — F0390 Unspecified dementia without behavioral disturbance: Secondary | ICD-10-CM

## 2014-08-18 DIAGNOSIS — I10 Essential (primary) hypertension: Secondary | ICD-10-CM

## 2014-08-18 NOTE — Progress Notes (Signed)
Patient ID: CORENE RESNICK, female   DOB: 1925-02-20, 80 y.o.   MRN: [id number]   This is a routine visit  Level of care skilled.  Facility [facility]  .  Ch complaint-medical management of chronic medical issues including cervical neck fracture dementia question hypertension--also history of breast carcinoma  .  History of present illness.  Patient is a pleasant 80 year old female with a past medical history of breast cancer and dementia was brought to the emergency department on February after she was found by the plumber in her carport with a hematoma to the forehead as well as a laceration.  Patient could not recall whether she fell or passed out she does have a history of dementia.  She remained alert and oriented.  She was found to have a C2 cervical fracture with epidermal hematoma-C1 fracture-EKG did not show anything remarkable cardiac enzymes were negative her laceration was treated in the ER and appears to have resolved.  Neurosurgery was consulted and recommended nonoperative management of the cervical fractures.  She has been followed by neurosurgery-at one point had a Aspen collar --she's not really complaining of any neck pain or issues any numbness or tingling  Her other medical issues appear to be stable. She is here for long-term care-at 1 point there was some possibility of discharge but I suspect because patient was probably a poor candidate to be home alone especially with some history of dementia it was decided in her best interest for a higher level of care       -  Previous medical history.  History of cervical neck fracture status post fall.  UTI.  Dementia.  History of breast cancer  History of benign colonic neoplasm  Surgical history.  April 2008-right modified radical mastectomy secondary to ductal carcinoma right breast.  2006-right hemicolectomy-- secondary to what was diagnosed as a benign colonic neoplasm  Hospital studies.  2- 17  2015.  CT of the head without contrast-no acute intracranial abnormality scalp hematoma and laceration over the right frontal bone  CT cervical spine without contrast-showed fractures of the left lateral mass and right pedicle of C2 with anterior and left lateral and left posterior lateral epidural hematoma with a slight mass effect upon the thecal sac and spinal cord at C2-no subluxation at the site of the fractures-there is a single visible fracture of the left side of the posterior arch of C1.  2- 17 2015-pelvic x-ray.  No definite acute traumatic injury to the bony pelvis.  Chest x-ray-no radiographic evidence of acute cardiopulmonary disease  2-D echo-.  Showed a left ventricular ejection fraction of greater than 99%-BZJIR 1 diastolic dysfunction  Medications  Reviewed per [facility] e    .  History-limited information about patient does not have any smoking history no smokeless tobacco history on file-no history of alcohol or illicit drug use.  Apparently she had been living alone in her house in the [place] area  .  Family history is not available from any current source  .  Review of systems.  In general is not complaining of any fever or chills.  Skin-did not complain of any itching or rashes  Head ears eyes nose mouth and throat-does not complaining of any visual changes she has prescription lenses does not complain of any blurred vision.  Respiratory no complaints of shortness of breath or cough  Cardiac-no complaints of chest pain.  GI denies any abdominal pain no nausea vomiting diarrhea or constipation a.  GU---in the past has  been treated for UTI --is not complaining of dysuria  Muscle skeletal-does not really complain of pain today--actually denies any neck pain  .  Neurologic does not complaining of any headache or dizziness does have a history of cervical spine fracture as noted above but did not complain of any numbness tingling   Psych she does  have some history of dementia as noted above did not appear anxious .  Physical exam.  T-. 98.0 pulse 64 respirations 20 blood pressure 112/69 all these appear stable weight is 133 this appears again of about 5 pounds since the summer   In general this is a pleasant frail-appearing elderly female in no distress sitting comfortably in her wheelchair.  Her skin is warm and dry  .  Eyes  -visual acuity appears grossly intact she does have prescription lenses  Neck-- appears to be supple with baseline range of motion --does not really show any deformity or pain e.  Oropharynx is clear mucous membranes moist.   Chest is clear to auscultation there is no labored breathing She is status post right partial mastectomy  Heart is regular rate and rhythm without murmur gallop or rub she has trace lower extremity edema bilaterally.  Abdomen is soft nontender with positive bowel sounds.  Muscle skeletal is able to move all extremities with appropriate grip strength and range of motion --I do not note any deformities other than arthritic changes    Neurologic as appears grossly intact no lateralizing findings cranial nerves appear grossly intact-s  Psych --does appear to have some cognitive deficits she is alert and appropriate--although confused can carry on a fairly basic conversation  which has been her baseline recently  .  Labs  05/05/2014.  WBC 5.6 hemoglobin 13.4.  Sodium 143 potassium 4.3 BUN 20 creatinine 0.89.  Albumin 3.1 otherwise liver function tests within normal limits  02/06/2014.  WBC 7.0 hemoglobin 12.7 platelets 368.  Sodium 140 potassium 4.1 BUN 12 creatinine 0.84  12/21/2013.  Sodium 141 potassium 4.2 BUN 12 creatinine 0.89  12/14/2013.  WBC 6.7 hemoglobin 12.3 platelets 231.  Sodium 143 potassium 3.9 BUN 18 creatinine 0.91.  12/09/2013.  Sodium 143 potassium 4.1 BUN 18 creatinine 0.86.  Liver function tests within normal limits.  WBC  9.2 hemoglobin 13.5 platelets 240.  Assessment and plan .  #1 history of cervical neck fracture-this has been followed by neurosurgery in the past appears this is quite stable -- does not appear to be having any pain or neurologic issues- CT scan showed healed C1 left sided fracture and healing C2-spine x-rays in June showed a stable exam with no instability with flexion or extension-    #2-dementia-this appears actually to be fairly significant--she has been started on Aricept  this is probably moderate t  -  .  #3-history of breast cancer she is status post right modified radical mastectomy --apparently this has been stable for some time this had been followed by Dr. Gerarda Fraction--  4-history of right Hemicolectomy-this was apparently due to a benign colonic neoplasm--- apparently this has not been an issue in some time .  #5 status question history hypertension-I this appears stable currently most recent blood pressureis 112/69-101/56 .  #6-question history syncope-apparently patient denies this I see per hospital discharge note this was discussed with cardiology--and she actually had an office visit with conclusion of no aggressive workup unless symptoms persist and apparently she has been quite stable in this regard  of note would like update lab work including a  CBC and CMP--to ensure baseline stability  [phone]

## 2014-10-26 DIAGNOSIS — F0390 Unspecified dementia without behavioral disturbance: Secondary | ICD-10-CM | POA: Diagnosis not present

## 2014-12-10 ENCOUNTER — Non-Acute Institutional Stay (SKILLED_NURSING_FACILITY): Payer: Medicare Other | Admitting: Internal Medicine

## 2014-12-10 ENCOUNTER — Encounter: Payer: Self-pay | Admitting: Internal Medicine

## 2014-12-10 DIAGNOSIS — R55 Syncope and collapse: Secondary | ICD-10-CM

## 2014-12-10 DIAGNOSIS — D126 Benign neoplasm of colon, unspecified: Secondary | ICD-10-CM

## 2014-12-10 DIAGNOSIS — F0390 Unspecified dementia without behavioral disturbance: Secondary | ICD-10-CM

## 2014-12-10 DIAGNOSIS — S12100D Unspecified displaced fracture of second cervical vertebra, subsequent encounter for fracture with routine healing: Secondary | ICD-10-CM | POA: Diagnosis not present

## 2014-12-12 NOTE — Progress Notes (Signed)
Patient ID: Cynthia Cynthia [name] Cynthia [name], female   DOB: April 22, 1925, 81 y.o.   MRN: [id number]   his is a routine visit  Level of care skilled.  Facility [facility]  .  Ch complaint-medical management of chronic medical issues including cervical neck fracture dementia question hypertension--also history of breast carcinoma  .  History of present illness.  Patient is a pleasant 81 year old female with a past medical history of breast cancer and dementia was brought to the emergency department on February after she was found by the plumber in her carport with a hematoma to the forehead as well as a laceration.  Patient could not recall whether she fell or passed out she does have a history of dementia.  She remained alert and oriented.  She was found to have a C2 cervical fracture with epidermal hematoma-C1 fracture-EKG did not show anything remarkable cardiac enzymes were negative her laceration was treated in the ER and appears to have resolved.  Neurosurgery was consulted and recommended nonoperative management of the cervical fractures.  She has been followed by neurosurgery-at one point had a Aspen collar --she's not really complaining of any neck pain or issues any numbness or tingling  Her other medical issues appear to be stable. She is here for long-term care-at 1 point there was some possibility of discharge but I suspect because patient was probably a poor candidate to be home alone especially with some history of dementia it was decided in her best interest for a higher level of care  Her stay here has been quite unremarkable vital signs remained stable she does not have any complaints tonight nursing does not report any either she appears to have gained some weight I suspect this is appetite related       -  Previous medical history.  History of cervical neck fracture status post fall.  UTI.  Dementia.  History of breast cancer  History of benign colonic neoplasm  Surgical  history.  April 2008-right modified radical mastectomy secondary to ductal carcinoma right breast.  2006-right hemicolectomy-- secondary to what was diagnosed as a benign colonic neoplasm  Hospital studies.  2- 17 2015.  CT of the head without contrast-no acute intracranial abnormality scalp hematoma and laceration over the right frontal bone  CT cervical spine without contrast-showed fractures of the left lateral mass and right pedicle of C2 with anterior and left lateral and left posterior lateral epidural hematoma with a slight mass effect upon the thecal sac and spinal cord at C2-no subluxation at the site of the fractures-there is a single visible fracture of the left side of the posterior arch of C1.  2- 17 2015-pelvic x-ray.  No definite acute traumatic injury to the bony pelvis.  Chest x-ray-no radiographic evidence of acute cardiopulmonary disease  2-D echo-.  Showed a left ventricular ejection fraction of greater than 76%-EGBTD 1 diastolic dysfunction  Medications  Reviewed per [facility]    .  History-limited information about patient does not have any smoking history no smokeless tobacco history on file-no history of alcohol or illicit drug use.  Apparently she had been living alone in her house in the [place] area  .  Family history is not available from any current source  .  Review of systems.  In general is not complaining of any fever or chills.  Skin-did not complain of any itching or rashes  Head ears eyes nose mouth and throat-does not complaining of any visual changes she has prescription lenses does not complain  of any blurred vision.  Respiratory no complaints of shortness of breath or cough  Cardiac-no complaints of chest pain.  GI denies any abdominal pain no nausea vomiting diarrhea or constipation a.  GU---in the past has been treated for UTI --is not complaining of dysuria  Muscle skeletal-does not really complain of pain  today--actually denies any neck pain  .  Neurologic does not complaining of any headache or dizziness does have a history of cervical spine fracture as noted above but did not complain of any numbness tingling   Psych she does have some history of dementia as noted above did not appear anxious .  Physical exam.   Temperature 98.1 pulse 67 respirations 20 blood pressure 108/46-weight is 142.2 she continues to gain weight I suspect this is appetite related   In general this is a pleasant frail-appearing elderly female in no distress sitting comfortably in her wheelchair.  Her skin is warm and dry  .  Eyes  -visual acuity appears grossly intact she does have prescription lenses  Neck-- appears to be supple with baseline range of motion --does not really show any deformity or pain e.  Oropharynx is clear mucous membranes moist.   Chest is clear to auscultation there is no labored breathing  Heart is regular rate and rhythm without murmur gallop or rub she has trace lower extremity edema bilaterally.  Abdomen is soft nontender with positive bowel sounds. --She has a well-healed surgical scar Muscle skeletal is able to move all extremities with appropriate grip Cynthia [name] and range of motion --I do not note any deformities other than arthritic changes -- largely ambulates in a wheelchair   Neurologic as appears grossly intact no lateralizing findings cranial nerves appear grossly intact-s  Psych --does appear to have some cognitive deficits she is alert and appropriate--although confused can carry on a fairly basic conversation  which has been her baseline recently  .  Labs  08/24/2015.  WBC 6.5 hemoglobin 12.4 platelets 237.  Sodium 143 potassium 4.4 BUN 21 creatinine 0.98.    05/05/2014.  WBC 5.6 hemoglobin 13.4.  Sodium 143 potassium 4.3 BUN 20 creatinine 0.89.  Albumin 3.1 otherwise liver function tests within normal limits  02/06/2014.  WBC 7.0  hemoglobin 12.7 platelets 368.  Sodium 140 potassium 4.1 BUN 12 creatinine 0.84  12/21/2013.  Sodium 141 potassium 4.2 BUN 12 creatinine 0.89  12/14/2013.  WBC 6.7 hemoglobin 12.3 platelets 231.  Sodium 143 potassium 3.9 BUN 18 creatinine 0.91.  12/09/2013.  Sodium 143 potassium 4.1 BUN 18 creatinine 0.86.  Liver function tests within normal limits.  WBC 9.2 hemoglobin 13.5 platelets 240.  Assessment and plan .  #1 history of cervical neck fracture-this has been followed by neurosurgery in the past appears this is quite stable -- does not appear to be having any pain or neurologic issues- CT scan showed healed C1 left sided fracture and healing C2-spine x-rays in June showed a stable exam with no instability with flexion or extension-    #2-dementia-this appears actually to be fairly significant--she has been started on Aricept  this is probably moderate she is functioning well in this environment  -  .  #3-history of breast cancer she is status post right modified radical mastectomy --apparently this has been stable for some time this had been followed by Dr. Gerarda Fraction--  4-history of right Hemicolectomy-this was apparently due to a benign colonic neoplasm--- apparently this has not been an issue in some time .  #5 status question history  hypertension-I this appears stable currently most recent blood pressureis 108/46-137/67-93/57-systolics in the 31S appear to be fairly rare .  #6-question history syncope-apparently patient denies this I see per hospital discharge note this was discussed with cardiology--and she actually had an office visit with conclusion of no aggressive workup unless symptoms persist and apparently she has been quite stable in this regard  of note would like update lab work including a CBC and CMP--to ensure baseline stability  [phone]

## 2015-01-05 DIAGNOSIS — F0390 Unspecified dementia without behavioral disturbance: Secondary | ICD-10-CM | POA: Diagnosis not present

## 2015-05-03 DIAGNOSIS — F0390 Unspecified dementia without behavioral disturbance: Secondary | ICD-10-CM | POA: Diagnosis not present

## 2015-06-22 DIAGNOSIS — B351 Tinea unguium: Secondary | ICD-10-CM | POA: Diagnosis not present

## 2015-06-22 DIAGNOSIS — R262 Difficulty in walking, not elsewhere classified: Secondary | ICD-10-CM | POA: Diagnosis not present

## 2015-06-22 DIAGNOSIS — M79675 Pain in left toe(s): Secondary | ICD-10-CM | POA: Diagnosis not present

## 2015-06-22 DIAGNOSIS — M79674 Pain in right toe(s): Secondary | ICD-10-CM | POA: Diagnosis not present

## 2015-07-06 ENCOUNTER — Encounter: Payer: Self-pay | Admitting: Internal Medicine

## 2015-07-06 ENCOUNTER — Non-Acute Institutional Stay (SKILLED_NURSING_FACILITY): Payer: Medicare Other | Admitting: Internal Medicine

## 2015-07-06 DIAGNOSIS — F0390 Unspecified dementia without behavioral disturbance: Secondary | ICD-10-CM

## 2015-07-06 DIAGNOSIS — I10 Essential (primary) hypertension: Secondary | ICD-10-CM | POA: Diagnosis not present

## 2015-07-06 DIAGNOSIS — S12100D Unspecified displaced fracture of second cervical vertebra, subsequent encounter for fracture with routine healing: Secondary | ICD-10-CM | POA: Diagnosis not present

## 2015-07-06 NOTE — Progress Notes (Signed)
Patient ID: Cynthia [name], female   DOB: [birth year], 81 y.o.   MRN: [id number]     his is a routine visit  Level of care skilled.  Facility [facility]  .  Ch complaint-medical management of chronic medical issues including cervical neck fracture dementia question hypertension--also history of breast carcinoma  .  History of present illness.  Patient is a pleasant 81 year old female with a past medical history of breast cancer and dementia was brought to the emergency department on February after she was found by the plumber in her carport with a hematoma to the forehead as well as a laceration.  Patient could not recall whether she fell or passed out she does have a history of dementia.  She remained alert and oriented.  She was found to have a C2 cervical fracture with epidermal hematoma-C1 fracture-EKG did not show anything remarkable cardiac enzymes were negative her laceration was treated in the ER and appears to have resolved.  Neurosurgery was consulted and recommended nonoperative management of the cervical fractures.  She has been followed by neurosurgery-at one point had a Aspen collar --she's not really complaining of any neck pain or issues any numbness or tingling  Her other medical issues appear to be stable. She is here for long-term care-at 1 point there was some possibility of discharge but I suspect because patient was probably a poor candidate to be home alone especially with some history of dementia it was decided in her best interest for a higher level of care  Her stay here has been quite unremarkable vital signs remained stable she does not have any complaints-- nursing does not report any either she appears to have gained some weight I suspect this is appetite related       -  Previous medical history.  History of cervical neck fracture status post fall.  UTI.  Dementia.  History of breast cancer  History of benign colonic neoplasm  Surgical  history.  April 2008-right modified radical mastectomy secondary to ductal carcinoma right breast.  2006-right hemicolectomy-- secondary to what was diagnosed as a benign colonic neoplasm  Hospital studies.  2- 17 2015.  CT of the head without contrast-no acute intracranial abnormality scalp hematoma and laceration over the right frontal bone  CT cervical spine without contrast-showed fractures of the left lateral mass and right pedicle of C2 with anterior and left lateral and left posterior lateral epidural hematoma with a slight mass effect upon the thecal sac and spinal cord at C2-no subluxation at the site of the fractures-there is a single visible fracture of the left side of the posterior arch of C1.  2- 17 2015-pelvic x-ray.  No definite acute traumatic injury to the bony pelvis.  Chest x-ray-no radiographic evidence of acute cardiopulmonary disease  2-D echo-.  Showed a left ventricular ejection fraction of greater than 01%-BPZWC 1 diastolic dysfunction  Medications  Reviewed per [facility]  Tylenol 650 mg every 4 hours when necessary.  Aricept 10 mg daily.  Magic cup daily at bedtime.     Marland Kitchen  History-limited information about patient does not have any smoking history no smokeless tobacco history on file-no history of alcohol or illicit drug use.  Apparently she had been living alone in her house in the [place] area  .  Family history is not available from any current source  .  Review of systems.  In general is not complaining of any fever or chills.  Skin-did not complain of any itching or rashes  Head ears eyes nose mouth and throat-does not complaining of any visual changes she has prescription lenses does not complain of any blurred vision.  Respiratory no complaints of shortness of breath or cough  Cardiac-no complaints of chest pain.  GI denies any abdominal pain no nausea vomiting diarrhea or constipation a.  GU---in the past has been treated  for UTI --is not complaining of dysuria  Muscle skeletal-does not really complain of pain today-- denies any neck pain  .  Neurologic does not complaining of any headache or dizziness does have a history of cervical spine fracture as noted above but did not complain of any numbness tingling   Psych she does have some history of dementia as noted above did not appear anxious- .  Physical exam.    Temperature 98.1 pulse 70 respirations 22 blood pressure 112/60-117/64--weight is 150.2 this is up about 8 pounds since the beginning of the year   In general this is a pleasant frail-appearing elderly female in no distress sitting comfortably in her wheelchair.  Her skin is warm and dry  .  Eyes  -visual acuity appears grossly intact she does have prescription lenses  Neck-- appears to be supple with baseline range of motion --does not really show any deformity or pain .  Oropharynx is clear mucous membranes moist.   Chest is clear to auscultation there is no labored breathing  Heart is regular rate and rhythm without murmur gallop or rub she has trace lower extremity edema bilaterally.  Abdomen is soft nontender with positive bowel sounds. --She has a well-healed surgical scar Muscle skeletal is able to move all extremities with appropriate grip strength and range of motion --I do not note any deformities other than arthritic changes -- largely ambulates in a wheelchair   Neurologic as appears grossly intact no lateralizing findings cranial nerves appear grossly intact-s  Psych --does appear to have some cognitive deficits she is alert and appropriate- She is pleasant right alert and can carry on a conversation .  Labs    08/24/2015.  WBC 6.5 hemoglobin 12.4 platelets 237.  Sodium 143 potassium 4.4 BUN 21 creatinine 0.98.    05/05/2014.  WBC 5.6 hemoglobin 13.4.  Sodium 143 potassium 4.3 BUN 20 creatinine 0.89.  Albumin 3.1 otherwise liver function tests  within normal limits  02/06/2014.  WBC 7.0 hemoglobin 12.7 platelets 368.  Sodium 140 potassium 4.1 BUN 12 creatinine 0.84  12/21/2013.  Sodium 141 potassium 4.2 BUN 12 creatinine 0.89  12/14/2013.  WBC 6.7 hemoglobin 12.3 platelets 231.  Sodium 143 potassium 3.9 BUN 18 creatinine 0.91.  12/09/2013.  Sodium 143 potassium 4.1 BUN 18 creatinine 0.86.  Liver function tests within normal limits.  WBC 9.2 hemoglobin 13.5 platelets 240.  Assessment and plan .  #1 history of cervical neck fracture-this has been followed by neurosurgery in the past appears this is quite stable -- does not appear to be having any pain or neurologic issues- CT scan showed healed C1 left sided fracture and healing C2-spine x-rays  June2015 showed a stable exam with no instability with flexion or extension-    #2-dementia-this appears actually to be fairly significant--she has been started on Aricept  this is probably moderate she is functioning well in this environment  -  .  #3-history of breast cancer she is status post right modified radical mastectomy --apparently this has been stable for some time this had been followed by Dr. Gerarda Fraction--  4-history of right Hemicolectomy-this was apparently due to a  benign colonic neoplasm--- apparently this has not been an issue in some time .  #5 status question history hypertension-I this appears stable currently most recent blood pressureis 112/60-117/64 .  #6-question history syncope-apparently patient denies this I see per hospital discharge note this was discussed with cardiology--and she actually had an office visit with conclusion of no aggressive workup unless symptoms persist and apparently she has been quite stable in this regard  of note update lab work including a CBC and CMP--to ensure baseline stability  [phone]

## 2015-07-07 ENCOUNTER — Encounter (HOSPITAL_COMMUNITY)
Admission: AD | Admit: 2015-07-07 | Discharge: 2015-07-07 | Disposition: A | Discharge status: Home / Self Care | Payer: Medicare Other | Source: Skilled Nursing Facility | Attending: Internal Medicine | Admitting: Internal Medicine

## 2015-07-07 DIAGNOSIS — I10 Essential (primary) hypertension: Secondary | ICD-10-CM | POA: Diagnosis not present

## 2015-07-07 LAB — CBC WITH DIFFERENTIAL/PLATELET
Basophils Absolute: 0 10*3/uL (ref 0.0–0.1)
Basophils Relative: 1 %
Eosinophils Absolute: 0.3 10*3/uL (ref 0.0–0.7)
Eosinophils Relative: 5 %
HCT: 40.7 % (ref 36.0–46.0)
Hemoglobin: 13.5 g/dL (ref 12.0–15.0)
Lymphocytes Relative: 34 %
Lymphs Abs: 2.2 10*3/uL (ref 0.7–4.0)
MCH: 31.5 pg (ref 26.0–34.0)
MCHC: 33.2 g/dL (ref 30.0–36.0)
MCV: 94.9 fL (ref 78.0–100.0)
Monocytes Absolute: 0.6 10*3/uL (ref 0.1–1.0)
Monocytes Relative: 10 %
Neutro Abs: 3.4 10*3/uL (ref 1.7–7.7)
Neutrophils Relative %: 52 %
Platelets: 237 10*3/uL (ref 150–400)
RBC: 4.29 MIL/uL (ref 3.87–5.11)
RDW: 14 % (ref 11.5–15.5)
WBC: 6.6 10*3/uL (ref 4.0–10.5)

## 2015-07-07 LAB — COMPREHENSIVE METABOLIC PANEL
ALT: 11 U/L — ABNORMAL LOW (ref 14–54)
AST: 18 U/L (ref 15–41)
Albumin: 3.4 g/dL — ABNORMAL LOW (ref 3.5–5.0)
Alkaline Phosphatase: 66 U/L (ref 38–126)
Anion gap: 6 (ref 5–15)
BUN: 15 mg/dL (ref 6–20)
CO2: 26 mmol/L (ref 22–32)
Calcium: 8.5 mg/dL — ABNORMAL LOW (ref 8.9–10.3)
Chloride: 106 mmol/L (ref 101–111)
Creatinine, Ser: 0.88 mg/dL (ref 0.44–1.00)
GFR calc Af Amer: >60 mL/min (ref >60)
GFR calc non Af Amer: 57 mL/min — ABNORMAL LOW (ref >60)
Glucose, Bld: 87 mg/dL (ref 65–99)
Potassium: 4.2 mmol/L (ref 3.5–5.1)
Sodium: 138 mmol/L (ref 135–145)
Total Bilirubin: 0.5 mg/dL (ref 0.3–1.2)
Total Protein: 6.8 g/dL (ref 6.5–8.1)

## 2015-08-01 DIAGNOSIS — H26493 Other secondary cataract, bilateral: Secondary | ICD-10-CM | POA: Diagnosis not present

## 2015-08-18 DIAGNOSIS — F0390 Unspecified dementia without behavioral disturbance: Secondary | ICD-10-CM | POA: Diagnosis not present

## 2015-10-26 ENCOUNTER — Encounter: Payer: Self-pay | Admitting: Internal Medicine

## 2015-10-26 ENCOUNTER — Non-Acute Institutional Stay (SKILLED_NURSING_FACILITY): Payer: Medicare Other | Admitting: Internal Medicine

## 2015-10-26 DIAGNOSIS — F0390 Unspecified dementia without behavioral disturbance: Secondary | ICD-10-CM

## 2015-10-26 DIAGNOSIS — S12191S Other nondisplaced fracture of second cervical vertebra, sequela: Secondary | ICD-10-CM

## 2015-10-26 NOTE — Progress Notes (Signed)
Patient ID: Cynthia [name], female   DOB: 05/30/1925, 82 y.o.   MRN: [id number]      his is a routine visit  Level of care skilled.  Facility [facility]  .  Ch complaint-medical management of chronic medical issues including cervical neck fracture dementia question hypertension--also history of breast carcinoma  .  History of present illness.  Patient is a pleasant 82 year old female with a past medical history of breast cancer and dementia was brought to the emergency department on February after she was found by the plumber in her carport with a hematoma to the forehead as well as a laceration.  Patient could not recall whether she fell or passed out she does have a history of dementia.  She remained alert and oriented.  She was found to have a C2 cervical fracture with epidermal hematoma-C1 fracture-EKG did not show anything remarkable cardiac enzymes were negative her laceration was treated in the ER and appears to have resolved.  Neurosurgery was consulted and recommended nonoperative management of the cervical fractures.  She has been followed by neurosurgery-at one point had a Aspen collar --she's not really complaining of any neck pain or issues any numbness or tingling  Her other medical issues appear to be stable. She is here for long-term care-atone point there was some possibility of discharge but I suspect because patient was probably a poor candidate to be home alone especially with some history of dementia it was decided in her best interest for a higher level of care  Her stay here has been quite unremarkable vital signs remained stable she does not have any complaints--however nursing has noted somewhat of a cough and cold symptoms-patient does not complain of any shortness of breath apparently she is deferring treatment for the cough saying it's not that big a deal-she did not complain of any nasal congestion        -  Previous medical history.  History of  cervical neck fracture status post fall.  UTI.  Dementia.  History of breast cancer  History of benign colonic neoplasm  Surgical history.  April 2008-right modified radical mastectomy secondary to ductal carcinoma right breast.  2006-right hemicolectomy-- secondary to what was diagnosed as a benign colonic neoplasm  Hospital studies.  2- 17 2015.  CT of the head without contrast-no acute intracranial abnormality scalp hematoma and laceration over the right frontal bone  CT cervical spine without contrast-showed fractures of the left lateral mass and right pedicle of C2 with anterior and left lateral and left posterior lateral epidural hematoma with a slight mass effect upon the thecal sac and spinal cord at C2-no subluxation at the site of the fractures-there is a single visible fracture of the left side of the posterior arch of C1.  2- 17 2015-pelvic x-ray.  No definite acute traumatic injury to the bony pelvis.  Chest x-ray-no radiographic evidence of acute cardiopulmonary disease  2-D echo-.  Showed a left ventricular ejection fraction of greater than 123456 1 diastolic dysfunction  Medications  Reviewed per [facility]  Tylenol 650 mg every 4 hours when necessary.  Aricept 10 mg daily.  Magic cup daily at bedtime.     Marland Kitchen  History-limited information about patient does not have any smoking history no smokeless tobacco history on file-no history of alcohol or illicit drug use.  Apparently she had been living alone in her house in the [place] area  .  Family history is not available from any current source  .  Review  of systems.  In general is not complaining of any fever or chills.  Skin-did not complain of any itching or rashes  Head ears eyes nose mouth and throat-does not complaining of any visual changes she has prescription lenses does not complain of any blurred vision.  Respiratory no complaints of shortness of breath --per nursing is  having a cough that is somewhat productive  Cardiac-no complaints of chest pain.  GI denies any abdominal pain no nausea vomiting diarrhea or constipation a.  GU---in the past has been treated for UTI --is not complaining of dysuria  Muscle skeletal-does not really complain of pain today-- denies any neck pain  .  Neurologic does not complaining of any headache or dizziness does have a history of cervical spine fracture as noted above but did not complain of any numbness tingling   Psych she does have some history of dementia as noted above did not appear anxious- .  Physical exam.    There is 97.6 pulse 88 respirations 18 blood pressure 120/82-104/90 most recently   In general this is a pleasant frail-appearing elderly female in no distress sitting comfortably in her wheelchair.  Her skin is warm and dry  .  Eyes  -visual acuity appears grossly intact she does have prescription lenses  Neck-- appears to be supple with baseline range of motion --does not really show any deformity or pain .  Oropharynx is clear mucous membranes moist.  [name] is status post right mastectomy there is a well-healed surgical scar-I could not appreciate any abnormality of the left breast--somewhat limited exam since patient did not want an extensive breast exam  Chest is clear to auscultation there is no labored breathing  Heart is regular rate and rhythm without murmur gallop or rub she has trace lower extremity edema bilaterally.  Abdomen is soft nontender with positive bowel sounds. --She has a well-healed surgical scar Muscle skeletal is able to move all extremities with appropriate grip strength and range of motion --I do not note any deformities other than arthritic changes -- largely ambulates in a wheelchair   Neurologic as appears grossly intact no lateralizing findings cranial nerves appear grossly intact-s  Psych --does appear to have some cognitive deficits she is  alert and appropriate- She is pleasant right alert and can carry on a conversation .  Labs  07/07/2015.  Sodium 1:30 potassium 4.2 BUN 15 creatinine 0.88.  Calcium 8.5.  Albumin 3.4. Failed T11.  Otherwise liver function tests within normal limits.  WBC 6.6 hemoglobin 13.5 platelets 237    08/23/2014.  WBC 6.5 hemoglobin 12.4 platelets 237.  Sodium 143 potassium 4.4 BUN 21 creatinine 0.98.    05/05/2014.  WBC 5.6 hemoglobin 13.4.  Sodium 143 potassium 4.3 BUN 20 creatinine 0.89.  Albumin 3.1 otherwise liver function tests within normal limits  02/06/2014.  WBC 7.0 hemoglobin 12.7 platelets 368.  Sodium 140 potassium 4.1 BUN 12 creatinine 0.84  12/21/2013.  Sodium 141 potassium 4.2 BUN 12 creatinine 0.89  12/14/2013.  WBC 6.7 hemoglobin 12.3 platelets 231.  Sodium 143 potassium 3.9 BUN 18 creatinine 0.91.  12/09/2013.  Sodium 143 potassium 4.1 BUN 18 creatinine 0.86.  Liver function tests within normal limits.  WBC 9.2 hemoglobin 13.5 platelets 240.  Assessment and plan .  #1 history of cervical neck fracture-this has been followed by neurosurgery in the past appears this is quite stable -- does not appear to be having any pain or neurologic issues- CT scan showed healed C1 left sided fracture  and healing C2-spine x-rays  June2015 showed a stable exam with no instability with flexion or extension-    #2-dementia-this appears actually to be fairly significant--she has been started on Aricept  this is probably moderate she continues to function well in this environment  -  .  #3-history of breast cancer she is status post right modified radical mastectomy --apparently this has been stable for some time this had been followed by Dr. Gwenyth Bouillon does not want aggressive follow-up  4-history of right Hemicolectomy-this was apparently due to a benign colonic neoplasm--- apparently this has not been an issue in some time .  #5 status question  history hypertension-I this appears stable currently most recent blood pressureis 120/82-104/90 .  #6-question history syncope-apparently patient denies this I see per hospital discharge note this was discussed with cardiology--and she actually had an office visit with conclusion of no aggressive workup unless symptoms persist and apparently she has been quite stable in this regard  #7-cough-patient is minimizing this-apparently she does not really want medication but will write an order for Tylenol Cold and flu and hopefully she will take this at least on a when necessary basis over the next few days if needed-- continue to monitor status vital signs twice a day for 3 days with pulse ox  Will update CBC and metabolic panel for updated lab work  [phone]

## 2015-10-27 ENCOUNTER — Encounter (HOSPITAL_COMMUNITY)
Admission: RE | Admit: 2015-10-27 | Discharge: 2015-10-27 | Disposition: A | Discharge status: Home / Self Care | Payer: Medicare Other | Source: Skilled Nursing Facility | Attending: Internal Medicine | Admitting: Internal Medicine

## 2015-10-27 DIAGNOSIS — I10 Essential (primary) hypertension: Secondary | ICD-10-CM | POA: Diagnosis not present

## 2015-10-27 LAB — BASIC METABOLIC PANEL
Anion gap: 4 — ABNORMAL LOW (ref 5–15)
BUN: 13 mg/dL (ref 4–21)
BUN: 13 mg/dL (ref 6–20)
CO2: 28 mmol/L (ref 22–32)
Calcium: 8.5 mg/dL — ABNORMAL LOW (ref 8.9–10.3)
Chloride: 109 mmol/L (ref 101–111)
Creatinine, Ser: 0.93 mg/dL (ref 0.44–1.00)
Creatinine: 0.9 mg/dL (ref <=1.1)
GFR calc Af Amer: >60 mL/min (ref >60)
GFR calc non Af Amer: 53 mL/min — ABNORMAL LOW (ref >60)
Glucose, Bld: 93 mg/dL (ref 65–99)
Glucose: 93 mg/dL
Potassium: 4 mmol/L (ref 3.5–5.1)
Sodium: 141 mmol/L (ref 135–145)
Sodium: 141 mmol/L (ref 137–147)

## 2015-10-27 LAB — CBC
HCT: 37.9 % (ref 36.0–46.0)
Hemoglobin: 12.3 g/dL (ref 12.0–15.0)
MCH: 30.4 pg (ref 26.0–34.0)
MCHC: 32.5 g/dL (ref 30.0–36.0)
MCV: 93.8 fL (ref 78.0–100.0)
Platelets: 216 10*3/uL (ref 150–400)
RBC: 4.04 MIL/uL (ref 3.87–5.11)
RDW: 14.6 % (ref 11.5–15.5)
WBC: 5.7 10*3/uL (ref 4.0–10.5)

## 2015-10-27 LAB — CBC AND DIFFERENTIAL: WBC: 5.7 10^3/mL

## 2015-11-14 DIAGNOSIS — I2510 Atherosclerotic heart disease of native coronary artery without angina pectoris: Secondary | ICD-10-CM | POA: Diagnosis not present

## 2015-11-14 DIAGNOSIS — M79672 Pain in left foot: Secondary | ICD-10-CM | POA: Diagnosis not present

## 2015-11-14 DIAGNOSIS — B351 Tinea unguium: Secondary | ICD-10-CM | POA: Diagnosis not present

## 2015-11-14 DIAGNOSIS — M79671 Pain in right foot: Secondary | ICD-10-CM | POA: Diagnosis not present

## 2016-02-08 ENCOUNTER — Non-Acute Institutional Stay (SKILLED_NURSING_FACILITY): Payer: Medicare Other | Admitting: Internal Medicine

## 2016-02-08 DIAGNOSIS — S12100A Unspecified displaced fracture of second cervical vertebra, initial encounter for closed fracture: Secondary | ICD-10-CM | POA: Diagnosis not present

## 2016-02-08 DIAGNOSIS — C50911 Malignant neoplasm of unspecified site of right female breast: Secondary | ICD-10-CM | POA: Diagnosis not present

## 2016-02-08 DIAGNOSIS — F0390 Unspecified dementia without behavioral disturbance: Secondary | ICD-10-CM

## 2016-02-08 NOTE — Progress Notes (Signed)
Patient ID: Cynthia [name], female   DOB: 1924/12/17, 82 y.o.   MRN: [id number]

## 2016-02-09 ENCOUNTER — Encounter (HOSPITAL_COMMUNITY)
Admission: RE | Admit: 2016-02-09 | Discharge: 2016-02-09 | Disposition: A | Discharge status: Home / Self Care | Payer: Medicare Other | Source: Skilled Nursing Facility | Attending: Internal Medicine | Admitting: Internal Medicine

## 2016-02-09 DIAGNOSIS — Z9181 History of falling: Secondary | ICD-10-CM | POA: Diagnosis not present

## 2016-02-09 DIAGNOSIS — F0390 Unspecified dementia without behavioral disturbance: Secondary | ICD-10-CM | POA: Diagnosis not present

## 2016-02-09 LAB — CBC WITH DIFFERENTIAL/PLATELET
Basophils Absolute: 0 10*3/uL (ref 0.0–0.1)
Basophils Relative: 0 %
Eosinophils Absolute: 0.4 10*3/uL (ref 0.0–0.7)
Eosinophils Relative: 6 %
HCT: 42.6 % (ref 36.0–46.0)
Hemoglobin: 13.9 g/dL (ref 12.0–15.0)
Lymphocytes Relative: 29 %
Lymphs Abs: 2.1 10*3/uL (ref 0.7–4.0)
MCH: 30.9 pg (ref 26.0–34.0)
MCHC: 32.6 g/dL (ref 30.0–36.0)
MCV: 94.7 fL (ref 78.0–100.0)
Monocytes Absolute: 0.5 10*3/uL (ref 0.1–1.0)
Monocytes Relative: 6 %
Neutro Abs: 4.2 10*3/uL (ref 1.7–7.7)
Neutrophils Relative %: 59 %
Platelets: 269 10*3/uL (ref 150–400)
RBC: 4.5 MIL/uL (ref 3.87–5.11)
RDW: 14.9 % (ref 11.5–15.5)
WBC: 7.1 10*3/uL (ref 4.0–10.5)

## 2016-02-09 LAB — COMPREHENSIVE METABOLIC PANEL
ALT: 15 U/L (ref 14–54)
AST: 19 U/L (ref 15–41)
Albumin: 3.4 g/dL — ABNORMAL LOW (ref 3.5–5.0)
Alkaline Phosphatase: 67 U/L (ref 38–126)
Anion gap: 8 (ref 5–15)
BUN: 14 mg/dL (ref 6–20)
CO2: 26 mmol/L (ref 22–32)
Calcium: 9.1 mg/dL (ref 8.9–10.3)
Chloride: 107 mmol/L (ref 101–111)
Creatinine, Ser: 1.01 mg/dL — ABNORMAL HIGH (ref 0.44–1.00)
GFR calc Af Amer: 56 mL/min — ABNORMAL LOW (ref >60)
GFR calc non Af Amer: 48 mL/min — ABNORMAL LOW (ref >60)
Glucose, Bld: 96 mg/dL (ref 65–99)
Potassium: 4.3 mmol/L (ref 3.5–5.1)
Sodium: 141 mmol/L (ref 135–145)
Total Bilirubin: 0.6 mg/dL (ref 0.3–1.2)
Total Protein: 7.2 g/dL (ref 6.5–8.1)

## 2016-02-11 NOTE — Progress Notes (Signed)
Patient ID: Cynthia [name], female   DOB: [birth year], 82 y.o.   MRN: [id number]       his is a routine visit  Level of care skilled.  Facility [facility]  .  Ch complaint-medical management of chronic medical issues including cervical neck fracture dementia question hypertension--also history of breast carcinoma  .  History of present illness.  Patient is a pleasant 82 year old female with a past medical history of breast cancer and dementia was brought to the emergency department on February after she was found by the plumber in her carport with a hematoma to the forehead as well as a laceration.  Patient could not recall whether she fell or passed out she does have a history of dementia.  She remained alert and oriented.  She was found to have a C2 cervical fracture with epidermal hematoma-C1 fracture-EKG did not show anything remarkable cardiac enzymes were negative her laceration was treated in the ER and appears to have resolved.  Neurosurgery was consulted and recommended nonoperative management of the cervical fractures.  She has been followed by neurosurgery-at one point had a Aspen collar --she's not really complaining of any neck pain or issues any numbness or tingling--she appears to be quite stable in this respect  Her other medical issues appear to be stable.as well--- She is here for long-term care-atone point there was some possibility of discharge but I suspect because patient was probably a poor candidate to be home alone especially with some history of dementia it was decided in her best interest for a higher level of care  Her stay here has been quite unremarkable vital signs remained stable she does not have any complaints-         -  Previous medical history.  History of cervical neck fracture status post fall.  UTI.  Dementia.  History of breast cancer  History of benign colonic neoplasm  Surgical history.  April 2008-right modified  radical mastectomy secondary to ductal carcinoma right breast.  2006-right hemicolectomy-- secondary to what was diagnosed as a benign colonic neoplasm  Hospital studies.  2- 17 2015.  CT of the head without contrast-no acute intracranial abnormality scalp hematoma and laceration over the right frontal bone  CT cervical spine without contrast-showed fractures of the left lateral mass and right pedicle of C2 with anterior and left lateral and left posterior lateral epidural hematoma with a slight mass effect upon the thecal sac and spinal cord at C2-no subluxation at the site of the fractures-there is a single visible fracture of the left side of the posterior arch of C1.  2- 17 2015-pelvic x-ray.  No definite acute traumatic injury to the bony pelvis.  Chest x-ray-no radiographic evidence of acute cardiopulmonary disease  2-D echo-.  Showed a left ventricular ejection fraction of greater than 123456 1 diastolic dysfunction  Medications  Reviewed per [facility]  Tylenol 650 mg every 4 hours when necessary.  Aricept 10 mg daily.      Marland Kitchen  History-limited information about patient does not have any smoking history no smokeless tobacco history on file-no history of alcohol or illicit drug use.  Apparently she had been living alone in her house in the [place] area  .  Family history is not available from any current source  .  Review of systems.  In general is not complaining of any fever or chills.  Skin-did not complain of any itching or rashes  Head ears eyes nose mouth and throat-does not complaining of any  visual changes she has prescription lenses does not complain of any blurred vision.  Respiratory no complaints of shortness of breath --per nursing is having a cough that is somewhat productive  Cardiac-no complaints of chest pain.  GI denies any abdominal pain no nausea vomiting diarrhea or constipation a.  GU---in the past has been treated for UTI --is  not complaining of dysuria  Muscle skeletal-does not really complain of pain today-- denies any neck pain  .  Neurologic does not complaining of any headache or dizziness does have a history of cervical spine fracture as noted above but did not complain of any numbness tingling   Psych she does have some history of dementia as noted above but appears to have adapted quite well to the facility with supportive care .  Physical exam.    T-. 98.0 pulse 71 respirations 20 blood pressure 145/55-127/68 most recently I do not see consistent elevations   In general this is a pleasant frail-appearing elderly female in no distress sitting comfortably in her wheelchair.  Her skin is warm and dry  .  Eyes  -visual acuity appears grossly intact she does have prescription lenses  Neck-- appears to be supple with baseline range of motion --does not really show any deformity or pain .  Oropharynx is clear mucous membranes moist.  Breasts-she is status post right mastectomy there is a well-healed surgical scar-I could not appreciate any abnormality of the left breast--somewhat limited exam since patient doesnot want an extensive breast exam  Chest is clear to auscultation there is no labored breathing  Heart is regular rate and rhythm without murmur gallop or rub she has trace lower extremity edema bilaterally.  Abdomen is soft nontender with positive bowel sounds. --She has a well-healed surgical scar Muscle skeletal is able to move all extremities with appropriate grip strength and range of motion --I do not note any deformities other than arthritic changes -- largely ambulates in a wheelchair   Neurologic as appears grossly intact no lateralizing findings cranial nerves appear grossly intact-s  Psych --does appear to have some cognitive deficits she is alert and appropriate- She is pleasant right alert and can carry on a conversation .  Labs  10/27/2015.  Sodium 141  potassium 4 BUN 13 creatinine 0.93.  WBC 5.7 hemoglobin 12.3 platelets 216.    07/07/2015.  Sodium 1:30 potassium 4.2 BUN 15 creatinine 0.88.  Calcium 8.5.  Albumin 3.4. Failed T11.  Otherwise liver function tests within normal limits.  WBC 6.6 hemoglobin 13.5 platelets 237    08/23/2014.  WBC 6.5 hemoglobin 12.4 platelets 237.  Sodium 143 potassium 4.4 BUN 21 creatinine 0.98.    05/05/2014.  WBC 5.6 hemoglobin 13.4.  Sodium 143 potassium 4.3 BUN 20 creatinine 0.89.  Albumin 3.1 otherwise liver function tests within normal limits  02/06/2014.  WBC 7.0 hemoglobin 12.7 platelets 368.  Sodium 140 potassium 4.1 BUN 12 creatinine 0.84  12/21/2013.  Sodium 141 potassium 4.2 BUN 12 creatinine 0.89  12/14/2013.  WBC 6.7 hemoglobin 12.3 platelets 231.  Sodium 143 potassium 3.9 BUN 18 creatinine 0.91.  12/09/2013.  Sodium 143 potassium 4.1 BUN 18 creatinine 0.86.  Liver function tests within normal limits.  WBC 9.2 hemoglobin 13.5 platelets 240.  Assessment and plan .  #1 history of cervical neck fracture-this has been followed by neurosurgery in the past appears this is quite stable -- does not appear to be having any pain or neurologic issues- CT scan showed healed C1 left sided fracture and  healing C2-spine x-rays  June2015 showed a stable exam with no instability with flexion or extension-    #2-dementia-this appears to be stable she is functioning well in this environment-she has been started on Aricept  this is probably moderate she continues to function well in this environment  -  .  #3-history of breast cancer she is status post right modified radical mastectomy --apparently this has been stable for some time this had been followed by Dr. Gwenyth Bouillon does not want aggressive follow-up  4-history of right Hemicolectomy-this was apparently due to a benign colonic neoplasm--- apparently this has not been an issue in some time .  #5 status  question history hypertension-I this appears relatively stable as noted above .  #6-question history syncope-apparently patient denies this I see per hospital discharge note this was discussed with cardiology--and she actually had an office visit with conclusion of no aggressive workup unless symptoms persist and apparently she has been quite stable in this regard  Will update a CBC and comprehensive metabolic panel for updated values     [phone]

## 2016-02-14 ENCOUNTER — Encounter: Payer: Self-pay | Admitting: Internal Medicine

## 2016-02-14 NOTE — Progress Notes (Deleted)
Patient ID: Cynthia [name], female   DOB: [birth year], 82 y.o.   MRN: [id number]  Location:  [place] of Service:  SNF (31) Provider:  ***  Glo Herring., MD  Patient Care Team: Redmond School, MD as PCP - General (Internal Medicine)  Extended Emergency Contact Information Primary Emergency Contact: [name], [place] [address] [place] Phone: [phone] Relation: Friend Secondary Emergency Contact: Aleman,[name]  [place] Phone: [phone] Relation: Daughter  Code Status:  *** Goals of care: Advanced Directive information Advanced Directives 02/14/2016  Does patient have an advance directive? Yes  Type of Advance Directive Living will  Does patient want to make changes to advanced directive? No - Patient declined  Copy of advanced directive(s) in chart? -     Chief Complaint  Patient presents with  . Acute Visit    HPI:  Pt is a 82 y.o. female seen today for an acute visit for    Past Medical History  Diagnosis Date  . Breast cancer ([place])   . Dementia   . Urinary tract infection, site not specified   . Difficulty in walking(719.7)   . Muscle weakness (generalized)   . Cognitive communication deficit    Past Surgical History  Procedure Laterality Date  . Colon surgery    . Mastectomy, partial Right     No Known Allergies  Current Outpatient Prescriptions on File Prior to Visit  Medication Sig Dispense Refill  . acetaminophen (TYLENOL) 325 MG tablet Take 650 mg by mouth every 4 (four) hours as needed for mild pain, moderate pain or fever (Inform physician of fever lasting over 24 hours.). For pain or temp >100    . AMBULATORY NON FORMULARY MEDICATION Magic Cup 129ml by mouth at bedtime to increase nutrition    . donepezil (ARICEPT) 5 MG tablet Take 10 mg by mouth at bedtime. For Dementia  disease     No current facility-administered medications on file prior to  visit.     Review of Systems  Immunization History  Administered Date(s) Administered  . Influenza-Unspecified 07/28/2014   Pertinent  Health Maintenance Due  Topic Date Due  . DEXA SCAN  08/25/1992  . PNA vac Low Risk Adult (1 of 2 - PCV13) 08/25/1992  . INFLUENZA VACCINE  05/22/2016   No flowsheet data found. Functional Status Survey:    Filed Vitals:   02/14/16 0907  BP: 130/70  Pulse: 66  Temp: 97.3 F (36.3 C)  TempSrc: Oral  Resp: 20  Height: 5\' 2"  (1.575 m)  Weight: 156 lb 6.4 oz (70.943 kg)   Body mass index is 28.6 kg/(m^2). Physical Exam  Labs reviewed:  Recent Labs  07/07/15 0710 10/27/15 10/27/15 0630 02/09/16 0750  NA 138 141 141 141  K 4.2  --  4.0 4.3  CL 106  --  109 107  CO2 26  --  28 26  GLUCOSE 87  --  93 96  BUN 15 13 13 14   CREATININE 0.88 0.9 0.93 1.01*  CALCIUM 8.5*  --  8.5* 9.1    Recent Labs  07/07/15 0710 02/09/16 0750  AST 18 19  ALT 11* 15  ALKPHOS 66 67  BILITOT 0.5 0.6  PROT 6.8 7.2  ALBUMIN 3.4* 3.4*    Recent Labs  07/07/15 0710 10/27/15 10/27/15 0630 02/09/16 0750  WBC 6.6 5.7 5.7 7.1  NEUTROABS 3.4  --   --  4.2  HGB 13.5  --  12.3 13.9  HCT 40.7  --  37.9 42.6  MCV 94.9  --  93.8 94.7  PLT 237  --  216 269   No results found for: TSH No results found for: HGBA1C No results found for: CHOL, HDL, LDLCALC, LDLDIRECT, TRIG, CHOLHDL  Significant Diagnostic Results in last 30 days:  No results found.  Assessment/Plan There are no diagnoses linked to this encounter.   Family/ staff Communication: ***  Labs/tests ordered:  ***   This encounter was created in error - please disregard.

## 2016-02-14 NOTE — Progress Notes (Signed)
This encounter was created in error - please disregard.

## 2016-03-15 ENCOUNTER — Non-Acute Institutional Stay (SKILLED_NURSING_FACILITY): Payer: Medicare Other | Admitting: Internal Medicine

## 2016-03-15 ENCOUNTER — Encounter: Payer: Self-pay | Admitting: Internal Medicine

## 2016-03-15 DIAGNOSIS — F0390 Unspecified dementia without behavioral disturbance: Secondary | ICD-10-CM | POA: Diagnosis not present

## 2016-03-15 DIAGNOSIS — R55 Syncope and collapse: Secondary | ICD-10-CM | POA: Diagnosis not present

## 2016-03-15 DIAGNOSIS — N289 Disorder of kidney and ureter, unspecified: Secondary | ICD-10-CM | POA: Insufficient documentation

## 2016-03-15 NOTE — Assessment & Plan Note (Addendum)
Patient denies any neuromuscular complaints related to the fracture

## 2016-03-15 NOTE — Progress Notes (Signed)
Patient ID: Cynthia [name], female   DOB: 06/24/1925, 82 y.o.   MRN: [id number]   Facility Location: [facility] Room Number: 141-D  This is a nursing facility follow up of chronic medical diagnoses.  Interim medical record and care since last [place] visit was updated with review of diagnostic studies and change in clinical status since last visit were documented.  HPI: She is a 82 year old female with dementia who sustained an syncopal episode with head laceration/hematoma 12/09/15. The patient could not provide any information concerning the fall due to her dementia.  Last labs were 02/09/16. Creatinine was mildly elevated at 1.01 and GFR reduced at 48. She had mild but stable reduction of albumin. She exhibited no anemia. There is no current B12 or TSH on record as part of a dementia evaluation.  Comprehensive review of systems is totally negative; but veracity is in question as she is unable give me the date or name of the president.  Constitutional: No fever,significant weight change, fatigue  Eyes: No redness, discharge, pain, vision change ENT/mouth: No nasal congestion,  purulent discharge, earache,change in hearing ,sore throat  Cardiovascular: No chest pain, palpitations,paroxysmal nocturnal dyspnea, claudication, edema  Respiratory: No cough, sputum production,hemoptysis, DOE , significant snoring,apnea   Gastrointestinal: No heartburn,dysphagia,abdominal pain, nausea / vomiting,rectal bleeding, melena,change in bowels Genitourinary: No dysuria,hematuria, pyuria,  incontinence, nocturia Musculoskeletal: No joint stiffness, joint swelling, weakness,pain Dermatologic: No rash, pruritus, change in appearance of skin Neurologic: No dizziness,headache,syncope, seizures, numbness , tingling Psychiatric: No significant anxiety , depression, insomnia, anorexia Endocrine: No change in hair/skin/ nails, excessive thirst, excessive hunger, excessive urination    Hematologic/lymphatic: No significant bruising, lymphadenopathy,abnormal bleeding Allergy/immunology: No itchy/ watery eyes, significant sneezing, urticaria, angioedema  Physical exam:  Pertinent or positive findings: She is alert and communicative even though she is not oriented to date. Her remote memory is good. 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 Pedal pulses are decreased. General appearance:Adequately nourished; no acute distress , increased work of breathing is present.   Lymphatic: No lymphadenopathy about the head, neck, axilla . Eyes: No conjunctival inflammation or lid edema is present. There is no scleral icterus. Ears:  External ear exam shows no significant lesions or deformities.   Nose:  External nasal examination shows no deformity or inflammation. Nasal mucosa are pink and moist without lesions ,exudates Oral exam: lips and gums are healthy appearing.There is no oropharyngeal erythema or exudate . Neck:  No thyromegaly, masses, tenderness noted.    Heart:  Normal rate and regular rhythm. S1 and S2 normal without gallop, murmur, click, rub .  Lungs:Chest clear to auscultation without wheezes, rhonchi,rales , rubs. Abdomen:Bowel sounds are normal. Abdomen is soft and nontender with no organomegaly, hernias,masses. GU: deferred as previously addressed. Extremities:  No cyanosis, clubbing,edema  Neurologic exam : Cn 2-7 intact Strength equal  in upper & lower extremities Balance,Rhomberg,finger to nose testing could not be completed due to clinical state Deep tendon reflexes are equal Skin: Warm & dry w/o tenting. No significant lesions or rash.    See summary under each active problem in the Problem List with associated updated therapeutic plan

## 2016-03-15 NOTE — Patient Instructions (Signed)
New orders for Matrix entry. BMET  TSH B12 level

## 2016-03-15 NOTE — Assessment & Plan Note (Signed)
TSH and B12 will be checked

## 2016-03-16 ENCOUNTER — Encounter (HOSPITAL_COMMUNITY)
Admission: RE | Admit: 2016-03-16 | Discharge: 2016-03-16 | Disposition: A | Discharge status: Home / Self Care | Payer: Medicare Other | Source: Skilled Nursing Facility | Attending: Internal Medicine | Admitting: Internal Medicine

## 2016-03-16 DIAGNOSIS — F0390 Unspecified dementia without behavioral disturbance: Secondary | ICD-10-CM | POA: Insufficient documentation

## 2016-03-16 DIAGNOSIS — Z9181 History of falling: Secondary | ICD-10-CM | POA: Insufficient documentation

## 2016-03-16 LAB — BASIC METABOLIC PANEL
Anion gap: 4 — ABNORMAL LOW (ref 5–15)
BUN: 16 mg/dL (ref 6–20)
CO2: 27 mmol/L (ref 22–32)
Calcium: 8.6 mg/dL — ABNORMAL LOW (ref 8.9–10.3)
Chloride: 110 mmol/L (ref 101–111)
Creatinine, Ser: 0.85 mg/dL (ref 0.44–1.00)
GFR calc Af Amer: >60 mL/min (ref >60)
GFR calc non Af Amer: 59 mL/min — ABNORMAL LOW (ref >60)
Glucose, Bld: 91 mg/dL (ref 65–99)
Potassium: 4.1 mmol/L (ref 3.5–5.1)
Sodium: 141 mmol/L (ref 135–145)

## 2016-03-16 LAB — VITAMIN B12: Vitamin B-12: 240 pg/mL (ref 180–914)

## 2016-03-16 LAB — TSH: TSH: 4.107 u[IU]/mL (ref 0.350–4.500)

## 2016-04-18 ENCOUNTER — Encounter: Payer: Self-pay | Admitting: Internal Medicine

## 2016-04-18 ENCOUNTER — Non-Acute Institutional Stay (SKILLED_NURSING_FACILITY): Payer: Medicare Other | Admitting: Internal Medicine

## 2016-04-18 DIAGNOSIS — R55 Syncope and collapse: Secondary | ICD-10-CM | POA: Diagnosis not present

## 2016-04-18 DIAGNOSIS — E538 Deficiency of other specified B group vitamins: Secondary | ICD-10-CM

## 2016-04-18 DIAGNOSIS — F0390 Unspecified dementia without behavioral disturbance: Secondary | ICD-10-CM | POA: Diagnosis not present

## 2016-04-18 DIAGNOSIS — I10 Essential (primary) hypertension: Secondary | ICD-10-CM | POA: Diagnosis not present

## 2016-04-18 NOTE — Progress Notes (Signed)
Location:   [place] Room Number: 141/D Place of Service:  SNF (31) Provider:  Leeanne Deed, MD  Patient Care Team: Hendricks Limes, MD as PCP - General (Internal Medicine)  Extended Emergency Contact Information Primary Emergency Contact: [name], [place] [address] [place] Phone: [phone] Relation: Friend Secondary Emergency Contact: Shuford,[name]  [place] Phone: [phone] Relation: Daughter  Code Status:  Full Code Goals of care: Advanced Directive information Advanced Directives 04/18/2016  Does patient have an advance directive? Yes  Type of Advance Directive -  Does patient want to make changes to advanced directive? No - Patient declined  Copy of advanced directive(s) in chart? Yes     Chief Complaint  Patient presents with  . Medical Management of Chronic Issues    Routine visit  Medical management of chronic medical issues including cervical  neck fracture-dementia-hypertension-history of syncope  HPI:  Pt is a 82 y.o. female seen today for Medical management of above-stated issues.  She continues to be quite stable.  She has a listed history of possible hypertension but she is not on any medications blood pressure continues to be stable recent blood pressures 122/77-119/53-I do see 140/69 but this is the higher end of her baseline.  She has have a history of dementia was brought to the emergency department after review she was Found in her carport with a hematoma to her for head as well as a laceration.  She was found to have a C2 cervical fracture with an epidural hematoma-EKG did not show anything remarkable cardiac enzymes were negative laceration was treated.  Neurosurgery was consulted and recommended nonoperative management of cervical fracture.  At one point she had an [place] appears to be in quite well with this does not complain of any  numbness or tingling continues to have a wheelchair without difficulty.  In regards to dementia Dr. Linna Darner did order labs including a B12 which was borderline low he did start her on B12 supplementation.  This is in regards to syncope per hospital discharge note this was discussed with cardiology and actually had an office visit and conclusion was no aggressive workup unless symptoms persist-and she has been asymptomatic during her stay here.  Currently she has no complaints she does have a history of breast carcinoma status post right modified radical mastectomy-this has been stable.  Also history of a right hemo-colectomy secondary to what was thought to be a benign  colonic neoplasm she continues wilth a well-healed abdominal scar   Past Medical History  Diagnosis Date  . Breast cancer ([place])   . Dementia   . Urinary tract infection, site not specified   . Difficulty in walking(719.7)   . Muscle weakness (generalized)   . Cognitive communication deficit    Past Surgical History  Procedure Laterality Date  . Colon surgery    . Mastectomy, partial Right     No Known Allergies  Outpatient Encounter Prescriptions as of 04/18/2016  Medication Sig  . AMBULATORY NON FORMULARY MEDICATION Magic Cup 122ml by mouth at bedtime to increase nutrition  . Cholecalciferol 1000 units tablet Take 1,000 Units by mouth daily.  . cyanocobalamin 1000 MCG tablet Take 1,000 mcg by mouth daily.  Marland Kitchen donepezil (ARICEPT) 5 MG tablet Take 10 mg by mouth at bedtime. For Dementia  disease   No facility-administered encounter medications on file as of 04/18/2016.  Review of Systems   General is not complaining of any fever or chills.  Skin is not complaining of any itching or rashes.  Head ears eyes nose mouth and throat does not complain of any visual changes continues with prescription lenses.  Spray does not complain of any shortness breath or cough says she is "doing fine".  Cardiac no  complaints of chest pain.  GI is not complaining of abdominal discomfort nausea vomiting diarrhea or constipation previous history of right colectomy in 2006.  GU is not complaining of dysuria.  Neurologic does not complain of any headaches dizziness or syncopal feelings or numbness does have a history of cervical spine fracture is noted above.  Psych some history of dementia again this is been assessed by Dr. Linna Darner thought to be mild she is now on B12 supplementation.    Immunization History  Administered Date(s) Administered  . Influenza-Unspecified 07/28/2014   Pertinent  Health Maintenance Due  Topic Date Due  . DEXA SCAN  04/18/2017 (Originally 08/25/1992)  . PNA vac Low Risk Adult (1 of 2 - PCV13) 04/18/2017 (Originally 08/25/1992)  . INFLUENZA VACCINE  05/22/2016   No flowsheet data found. Functional Status Survey:    Filed Vitals:   04/18/16 1522  BP: 122/77  Pulse: 74  Temp: 98.1 F (36.7 C)  TempSrc: Oral  Resp: 20  Height: 5\' 2"  (1.575 m)  Weight: 156 lb 9.6 oz (71.033 kg)   Body mass index is 28.64 kg/(m^2). Physical Exam   In general this is a pleasant elderly female in no distress.  Her skin is warm and dry.  Eyes visual acuity appears grossly intact she has prescription lenses sclera and conjunctiva are clear.  Oropharynx clear mucous membranes moist.  Chest is clear to auscultation there is no labored breathing.  Heart is regular rate and rhythm without murmur gallop or rub she has trace lower extremity edema bilaterally.  Abdomen soft nontender positive bowel sounds with a well-healed surgical scar.  Muscle skeletal--moves all should be sent for at baseline with arthritic changes ambulates well in a wheelchair.  Neurologic is grossly intact her speech is clear.  Psych she is grossly alert and oriented although does have confusion can carry on a fairly straightforward conversation     Labs reviewed:  Recent Labs  10/27/15 0630  02/09/16 0750 03/16/16 0841  NA 141 141 141  K 4.0 4.3 4.1  CL 109 107 110  CO2 28 26 27   GLUCOSE 93 96 91  BUN 13 14 16   CREATININE 0.93 1.01* 0.85  CALCIUM 8.5* 9.1 8.6*    Recent Labs  07/07/15 0710 02/09/16 0750  AST 18 19  ALT 11* 15  ALKPHOS 66 67  BILITOT 0.5 0.6  PROT 6.8 7.2  ALBUMIN 3.4* 3.4*    Recent Labs  07/07/15 0710 10/27/15 10/27/15 0630 02/09/16 0750  WBC 6.6 5.7 5.7 7.1  NEUTROABS 3.4  --   --  4.2  HGB 13.5  --  12.3 13.9  HCT 40.7  --  37.9 42.6  MCV 94.9  --  93.8 94.7  PLT 237  --  216 269   Lab Results  Component Value Date   TSH 4.107 03/16/2016   No results found for: HGBA1C No results found for: CHOL, HDL, LDLCALC, LDLDIRECT, TRIG, CHOLHDL  Significant Diagnostic Results in last 30 days:  No results found.  Assessment/Plan   #1-history of cervical neck fracture followed by neurosurgery in the past this appears to be stable  she does not appear to have significant residual effects-x-rays of June 2 15 showed stable exam with no instability with flexion or extension.  #2 dementia this appears to be stable she is functioning well with supportive care.--She continues on Aricept  B12 level was borderline low at 240 on lab done in late May this is being supplemented will update a B12 level in approximately 2 weeks.  #3 history of question hypertension as noted above blood pressure appears stable on no medication.  #4 history of syncope apparently this was worked up during her hospitalization. No aggressive workup unless symptoms persist and they have not at this point will monitor.  Of note Will update a B12 level as well as metabolic panel CBC in 2 weeks to assure stability she appears to be at baseline and has been stable for an extended period of time  [place], [place], [place]

## 2016-04-30 DIAGNOSIS — M79672 Pain in left foot: Secondary | ICD-10-CM | POA: Diagnosis not present

## 2016-04-30 DIAGNOSIS — M79671 Pain in right foot: Secondary | ICD-10-CM | POA: Diagnosis not present

## 2016-04-30 DIAGNOSIS — B351 Tinea unguium: Secondary | ICD-10-CM | POA: Diagnosis not present

## 2016-05-02 ENCOUNTER — Encounter (HOSPITAL_COMMUNITY)
Admission: AD | Admit: 2016-05-02 | Discharge: 2016-05-02 | Disposition: A | Discharge status: Home / Self Care | Payer: Medicare Other | Source: Skilled Nursing Facility | Attending: Internal Medicine | Admitting: Internal Medicine

## 2016-05-02 DIAGNOSIS — Z9181 History of falling: Secondary | ICD-10-CM | POA: Diagnosis not present

## 2016-05-02 DIAGNOSIS — F0390 Unspecified dementia without behavioral disturbance: Secondary | ICD-10-CM | POA: Insufficient documentation

## 2016-05-02 LAB — CBC WITH DIFFERENTIAL/PLATELET
Basophils Absolute: 0 10*3/uL (ref 0.0–0.1)
Basophils Relative: 1 %
Eosinophils Absolute: 0.3 10*3/uL (ref 0.0–0.7)
Eosinophils Relative: 5 %
HCT: 40.8 % (ref 36.0–46.0)
Hemoglobin: 13.1 g/dL (ref 12.0–15.0)
Lymphocytes Relative: 28 %
Lymphs Abs: 1.9 10*3/uL (ref 0.7–4.0)
MCH: 30.5 pg (ref 26.0–34.0)
MCHC: 32.1 g/dL (ref 30.0–36.0)
MCV: 94.9 fL (ref 78.0–100.0)
Monocytes Absolute: 0.5 10*3/uL (ref 0.1–1.0)
Monocytes Relative: 7 %
Neutro Abs: 3.9 10*3/uL (ref 1.7–7.7)
Neutrophils Relative %: 59 %
Platelets: 241 10*3/uL (ref 150–400)
RBC: 4.3 MIL/uL (ref 3.87–5.11)
RDW: 15 % (ref 11.5–15.5)
WBC: 6.6 10*3/uL (ref 4.0–10.5)

## 2016-05-02 LAB — BASIC METABOLIC PANEL
Anion gap: 7 (ref 5–15)
BUN: 16 mg/dL (ref 6–20)
CO2: 27 mmol/L (ref 22–32)
Calcium: 8.7 mg/dL — ABNORMAL LOW (ref 8.9–10.3)
Chloride: 106 mmol/L (ref 101–111)
Creatinine, Ser: 0.94 mg/dL (ref 0.44–1.00)
GFR calc Af Amer: >60 mL/min (ref >60)
GFR calc non Af Amer: 53 mL/min — ABNORMAL LOW (ref >60)
Glucose, Bld: 113 mg/dL — ABNORMAL HIGH (ref 65–99)
Potassium: 4.3 mmol/L (ref 3.5–5.1)
Sodium: 140 mmol/L (ref 135–145)

## 2016-05-02 LAB — VITAMIN B12: Vitamin B-12: 462 pg/mL (ref 180–914)

## 2016-05-24 ENCOUNTER — Encounter: Payer: Self-pay | Admitting: Internal Medicine

## 2016-05-24 NOTE — Progress Notes (Deleted)
Location:   [facility] Room Number: 141 Place of Service:  SNF (31) Provider:  Dr. Lyndel Safe    Patient Care Team: Hendricks Limes, MD as PCP - General (Internal Medicine)  Extended Emergency Contact Information Primary Emergency Contact: [name], [place] [address] [place] Phone: [phone] Relation: Friend Secondary Emergency Contact: Every,[name]  [place] Phone: [phone] Relation: Daughter  Code Status:  Full Code Goals of care: Advanced Directive information Advanced Directives 05/24/2016  Does patient have an advance directive? Yes  Type of Advance Directive (No Data)  Does patient want to make changes to advanced directive? No - Patient declined  Copy of advanced directive(s) in chart? Yes     Chief Complaint  Patient presents with  . Medical Management of Chronic Issues    Medical Management of Chronic Issues    HPI:  Pt is a 82 y.o. female seen today for medical management of chronic diseases.     Past Medical History:  Diagnosis Date  . Breast cancer ([place])   . Cognitive communication deficit   . Dementia   . Difficulty in walking(719.7)   . Muscle weakness (generalized)   . Urinary tract infection, site not specified    Past Surgical History:  Procedure Laterality Date  . COLON SURGERY    . MASTECTOMY, PARTIAL Right     No Known Allergies    Medication List    Notice   This visit is during an admission. Changes to the med list made in this visit will be reflected in the After Visit Summary of the admission.     Review of Systems  Immunization History  Administered Date(s) Administered  . Influenza-Unspecified 07/28/2014   Pertinent  Health Maintenance Due  Topic Date Due  . INFLUENZA VACCINE  05/22/2016  . DEXA SCAN  04/18/2017 (Originally 08/25/1992)  . PNA vac Low Risk Adult (1 of 2 - PCV13) 04/18/2017 (Originally 08/25/1992)   No flowsheet data  found. Functional Status Survey:    Vitals:   05/24/16 1415  BP: 106/72  Pulse: 73  Resp: 18  Temp: 97.3 F (36.3 C)  TempSrc: Oral  Weight: 156 lb (70.8 kg)  Height: 5\' 2"  (1.575 m)   Body mass index is 28.53 kg/m. Physical Exam  Labs reviewed:  Recent Labs  02/09/16 0750 03/16/16 0841 05/02/16 0740  NA 141 141 140  K 4.3 4.1 4.3  CL 107 110 106  CO2 26 27 27   GLUCOSE 96 91 113*  BUN 14 16 16   CREATININE 1.01* 0.85 0.94  CALCIUM 9.1 8.6* 8.7*    Recent Labs  07/07/15 0710 02/09/16 0750  AST 18 19  ALT 11* 15  ALKPHOS 66 67  BILITOT 0.5 0.6  PROT 6.8 7.2  ALBUMIN 3.4* 3.4*    Recent Labs  07/07/15 0710  10/27/15 0630 02/09/16 0750 05/02/16 0740  WBC 6.6  < > 5.7 7.1 6.6  NEUTROABS 3.4  --   --  4.2 3.9  HGB 13.5  --  12.3 13.9 13.1  HCT 40.7  --  37.9 42.6 40.8  MCV 94.9  --  93.8 94.7 94.9  PLT 237  --  216 269 241  < > = values in this interval not displayed. Lab Results  Component Value Date   TSH 4.107 03/16/2016   No results found for: HGBA1C No results found for: CHOL, HDL, LDLCALC, LDLDIRECT, TRIG, CHOLHDL  Significant Diagnostic Results in last 30 days:  No results found.  Assessment/Plan There are no diagnoses linked to this encounter.   Family/ staff Communication: ***  Labs/tests ordered:  ***    This encounter was created in error - please disregard.

## 2016-06-20 ENCOUNTER — Encounter: Payer: Self-pay | Admitting: Internal Medicine

## 2016-06-20 ENCOUNTER — Non-Acute Institutional Stay (SKILLED_NURSING_FACILITY): Payer: Medicare Other | Admitting: Internal Medicine

## 2016-06-20 DIAGNOSIS — F0390 Unspecified dementia without behavioral disturbance: Secondary | ICD-10-CM | POA: Diagnosis not present

## 2016-06-20 DIAGNOSIS — I10 Essential (primary) hypertension: Secondary | ICD-10-CM

## 2016-06-20 DIAGNOSIS — S12100A Unspecified displaced fracture of second cervical vertebra, initial encounter for closed fracture: Secondary | ICD-10-CM | POA: Diagnosis not present

## 2016-06-20 DIAGNOSIS — R55 Syncope and collapse: Secondary | ICD-10-CM

## 2016-06-20 NOTE — Progress Notes (Signed)
Location:   [facility] Room Number: 141/D Place of Service:  SNF (31) Provider:  Leeanne Deed, MD  Patient Care Team: Hendricks Limes, MD as PCP - General (Internal Medicine)  Extended Emergency Contact Information Primary Emergency Contact: [name], [place] [address] [place] Phone: [phone] Relation: Friend Secondary Emergency Contact: Griffo,[name]  [place] Phone: [phone] Relation: Daughter  Code Status: Full Code Goals of care: Advanced Directive information Advanced Directives 06/20/2016  Does patient have an advance directive? Yes  Type of Advance Directive (No Data)  Does patient want to make changes to advanced directive? No - Patient declined  Copy of advanced directive(s) in chart? Yes     Chief Complaint  Patient presents with  . Medical Management of Chronic Issues    Routine Visit  Comanagement of chronic medical issues including cervical neck fracture-dementia-hypertension-history of syncope.    HPI:  Pt is a 82 y.o. female seen today for medical management of chronic diseases as noted above.  She continues to be stable.  Blood pressures appear to be stable recently 120/75-113/75 she has a listed history of hypertension but this has been stable for some time.  She has a history of dementia but is doing well with supportive care.  [place] she was brought to the ER after she was found in her car port with a hematoma to her head as well as a laceration.  She did sustain a C2 cervical fracture with an epidural hematoma-neurosurgery was consulted and recommended nonoperative management with the cervical collar.  She is not complaining of any numbness or tingling appears to have done very well in this regards.  [place] or syncope hospital discharge note noted this was discussed with cardiology and conclusion was there was no aggressive workup  needed unless symptoms persist and she continues to be asymptomatic.  She does have a history of breast carcinoma status post right modified radical mastectomy this is been stable for some time as well.  Also has a history of right hemicolectomy secondary to what was thought to be a benign colonic neoplasm-  .     Past Medical History:  Diagnosis Date  . Breast cancer ([place])   . Cognitive communication deficit   . Dementia   . Difficulty in walking(719.7)   . Muscle weakness (generalized)   . Urinary tract infection, site not specified    Past Surgical History:  Procedure Laterality Date  . COLON SURGERY    . MASTECTOMY, PARTIAL Right     No Known Allergies  Current Outpatient Prescriptions on File Prior to Visit  Medication Sig Dispense Refill  . AMBULATORY NON FORMULARY MEDICATION Magic Cup 160ml by mouth at bedtime to increase nutrition    . Cholecalciferol 1000 units tablet Take 1,000 Units by mouth daily.    . cyanocobalamin 1000 MCG tablet Take 1,000 mcg by mouth daily.    Marland Kitchen donepezil (ARICEPT) 5 MG tablet Take 10 mg by mouth at bedtime. For Dementia  disease     No current facility-administered medications on file prior to visit.      Review of Systems   General is not complaining of any fever or chills.  Skin is not complaining of any itching or rashes.  Head ears eyes nose mouth and throat does not complain of any visual changes continues with prescription lenses.  Resp-- does not complain of any shortness  breath or cough .  Cardiac no complaints of chest pain.  GI is not complaining of abdominal discomfort nausea vomiting diarrhea or constipation previous history of right colectomy in 2006.  GU is not complaining of dysuria.  Neurologic does not complain of any headaches dizziness or syncopal feelings or numbness does have a history of cervical spine fracture is noted above.  Psych some history of dementia again this is been assessed by Dr.  Linna Darner thought to be mild she is now on B12 supplementation Continues to well with supportive care.  Immunization History  Administered Date(s) Administered  . Influenza-Unspecified 07/28/2014   Pertinent  Health Maintenance Due  Topic Date Due  . INFLUENZA VACCINE  05/22/2016  . DEXA SCAN  04/18/2017 (Originally 08/25/1992)  . PNA vac Low Risk Adult (1 of 2 - PCV13) 04/18/2017 (Originally 08/25/1992)   No flowsheet data found. Functional Status Survey:    Vitals:   06/20/16 1348  BP: 113/75  Pulse: 69  Resp: 20  Temp: (!) 96.8 F (36 C)  TempSrc: Oral  SpO2: 94%  Weight: 155 lb 12.8 oz (70.7 kg)  Height: 5\' 3"  (1.6 m)   Body mass index is 27.6 kg/m. Physical Exam  In general this is a pleasant elderly female in no distress sitting comfortably in her wheelchair.  Her skin is warm and dry.  Eyes visual acuity appears grossly intact she has prescription lenses sclera and conjunctiva are clear.  Oropharynx clear mucous membranes moist.  Chest is clear to auscultation there is no labored breathing.  Heart is regular rate and rhythm without murmur gallop or rub she has very minimal lower extremity edema bilaterally pedal pulses are intact bilaterally  Abdomen soft nontender positive bowel sounds with a well-healed surgical scar.  Muscle skeletal--she ambulates in wheelchair moves all extremities 4 with baseline strength  Neurologic is grossly intact her speech is clear.  Psych she is grossly alert and oriented at times has confusion but can follow on a fairly appropriate conversation  Labs reviewed:  Recent Labs  02/09/16 0750 03/16/16 0841 05/02/16 0740  NA 141 141 140  K 4.3 4.1 4.3  CL 107 110 106  CO2 26 27 27   GLUCOSE 96 91 113*  BUN 14 16 16   CREATININE 1.01* 0.85 0.94  CALCIUM 9.1 8.6* 8.7*    Recent Labs  07/07/15 0710 02/09/16 0750  AST 18 19  ALT 11* 15  ALKPHOS 66 67  BILITOT 0.5 0.6  PROT 6.8 7.2  ALBUMIN 3.4* 3.4*     Recent Labs  07/07/15 0710  10/27/15 0630 02/09/16 0750 05/02/16 0740  WBC 6.6  < > 5.7 7.1 6.6  NEUTROABS 3.4  --   --  4.2 3.9  HGB 13.5  --  12.3 13.9 13.1  HCT 40.7  --  37.9 42.6 40.8  MCV 94.9  --  93.8 94.7 94.9  PLT 237  --  216 269 241  < > = values in this interval not displayed. Lab Results  Component Value Date   TSH 4.107 03/16/2016   No results found for: HGBA1C No results found for: CHOL, HDL, LDLCALC, LDLDIRECT, TRIG, CHOLHDL  Significant Diagnostic Results in last 30 days:  No results found.  Assessment/Plan  #1-history of cervical neck fracture followed by neurosurgery in the past this appears to be stable she does not appear to have significant residual effects-follow-up x-rays showed stable exam with no instability with flexion or extension.  #2 dementia this appears to be stable  she is functioning well with supportive care.--She continues on Aricept  B12 level was borderline low at 240 on lab done in late May this is being supplemented Most recent lab was 462 on 05/02/2016.  #3 history of question hypertension as noted above blood pressure appears stable on no medication.  #4 history of syncope apparently this was worked up during her hospitalization. No aggressive workup unless symptoms persist and they have not at this point will monitor.     [id number]

## 2016-07-26 ENCOUNTER — Non-Acute Institutional Stay (SKILLED_NURSING_FACILITY): Payer: Medicare Other | Admitting: Internal Medicine

## 2016-07-26 ENCOUNTER — Encounter: Payer: Self-pay | Admitting: Internal Medicine

## 2016-07-26 DIAGNOSIS — F0390 Unspecified dementia without behavioral disturbance: Secondary | ICD-10-CM

## 2016-07-26 DIAGNOSIS — C50911 Malignant neoplasm of unspecified site of right female breast: Secondary | ICD-10-CM

## 2016-07-26 NOTE — Progress Notes (Signed)
Location:   [facility] Room Number: 141/D Place of Service:  SNF (31) Provider:  Haze Justin, MD  Patient Care Team: Hendricks Limes, MD as PCP - General (Internal Medicine) Virgie Dad, MD as Consulting Physician (Geriatric Medicine)  Extended Emergency Contact Information Primary Emergency Contact: [name], [place] [address] [place] Phone: [phone] Relation: Friend Secondary Emergency Contact: [place] of [place] Phone: [phone] Relation: Daughter  Code Status:  Full Code Goals of care: Advanced Directive information Advanced Directives 07/26/2016  Does patient have an advance directive? Yes  Type of Advance Directive (No Data)  Does patient want to make changes to advanced directive? No - Patient declined  Copy of advanced directive(s) in chart? Yes     Chief Complaint  Patient presents with  . Medical Management of Chronic Issues    Routine Visit    HPI:  Pt is a 82 y.o. female seen today for medical management of chronic diseases. Patient has been Long term care resident after her worsening dementia at home. She  Also had one syncopal episode in 02/17. Otherwise she has been very stable clinically. She denied any new problems today. Her appetite is good Her weight has been stable preciously but has lost few pounds recently around 4 since 07/17.Marland Kitchen   Past Medical History:  Diagnosis Date  . Breast cancer ([place])   . Cognitive communication deficit   . Dementia   . Difficulty in walking(719.7)   . Muscle weakness (generalized)   . Urinary tract infection, site not specified    Past Surgical History:  Procedure Laterality Date  . COLON SURGERY    . MASTECTOMY, PARTIAL Right     No Known Allergies  Current Outpatient Prescriptions on File Prior to Visit  Medication Sig Dispense Refill  . AMBULATORY NON FORMULARY MEDICATION Magic Cup 126ml  by mouth at bedtime to increase nutrition    . Cholecalciferol 1000 units tablet Take 1,000 Units by mouth daily.    . cyanocobalamin 1000 MCG tablet Take 1,000 mcg by mouth daily.    Marland Kitchen donepezil (ARICEPT) 5 MG tablet Take 10 mg by mouth at bedtime. For Dementia  disease     No current facility-administered medications on file prior to visit.     Review of Systems  Constitutional: Negative.  Negative for activity change, appetite change, chills and diaphoresis.  HENT: Negative.  Negative for congestion, dental problem, drooling, facial swelling and hearing loss.   Eyes: Negative.  Negative for pain, redness and itching.  Respiratory: Negative.  Negative for apnea, cough, choking and shortness of breath.   Cardiovascular: Negative.  Negative for chest pain, palpitations and leg swelling.  Gastrointestinal: Negative.  Negative for abdominal distention, abdominal pain, anal bleeding, blood in stool, constipation, diarrhea and nausea.  Genitourinary: Negative.  Negative for difficulty urinating, dyspareunia and dysuria.  Musculoskeletal: Negative.   Neurological: Negative for dizziness and facial asymmetry.  Psychiatric/Behavioral: Negative.     Immunization History  Administered Date(s) Administered  . Influenza-Unspecified 07/28/2014   Pertinent  Health Maintenance Due  Topic Date Due  . INFLUENZA VACCINE  05/22/2016  . DEXA SCAN  04/18/2017 (Originally 08/25/1992)  . PNA vac Low Risk Adult (1 of 2 - PCV13) 04/18/2017 (Originally 08/25/1992)   No flowsheet data found. Functional Status Survey:    Vitals:   07/26/16 1446  BP: 136/75  Pulse: 76  Resp: 20  Temp: 97 F (36.1 C)  TempSrc: Oral  SpO2: 94%  Weight: 153 lb 9.6 oz (69.7 kg)  Height: 5\' 3"  (1.6 m)   Body mass index is 27.21 kg/m. Physical Exam  Constitutional: She is oriented to person, place, and time. She appears well-developed and well-nourished.  HENT:  Head: Normocephalic.  Mouth/Throat: Oropharynx is  clear and moist.  Cardiovascular: Normal rate, regular rhythm and normal heart sounds.   Pulmonary/Chest: Effort normal and breath sounds normal. No respiratory distress. She has no wheezes. She has no rales.  Abdominal: Bowel sounds are normal. She exhibits no distension. There is no rebound and no guarding.  Musculoskeletal: She exhibits no edema.  Neurological: She is alert and oriented to person, place, and time.  Good strenth in all extremities.    Labs reviewed:  Recent Labs  02/09/16 0750 03/16/16 0841 05/02/16 0740  NA 141 141 140  K 4.3 4.1 4.3  CL 107 110 106  CO2 26 27 27   GLUCOSE 96 91 113*  BUN 14 16 16   CREATININE 1.01* 0.85 0.94  CALCIUM 9.1 8.6* 8.7*    Recent Labs  02/09/16 0750  AST 19  ALT 15  ALKPHOS 67  BILITOT 0.6  PROT 7.2  ALBUMIN 3.4*    Recent Labs  10/27/15 0630 02/09/16 0750 05/02/16 0740  WBC 5.7 7.1 6.6  NEUTROABS  --  4.2 3.9  HGB 12.3 13.9 13.1  HCT 37.9 42.6 40.8  MCV 93.8 94.7 94.9  PLT 216 269 241   Lab Results  Component Value Date   TSH 4.107 03/16/2016   No results found for: HGBA1C No results found for: CHOL, HDL, LDLCALC, LDLDIRECT, TRIG, CHOLHDL  Significant Diagnostic Results in last 30 days:  No results found.  Assessment/Plan  Dementia  Patient is doing well on Aricept. No complains.  Will continue supportive care  History of right Breast Cancer 2008 S/P Modified Mastectomy  It seems like it was ductal in situ and patient did not wont anything aggressive.  Patient all labs were Normal in 07/17 Will repeat CBC and BMP.  Patient had mild variation in the weight recently. Will continue to monitor.   Family/ staff Communication:   Labs/tests ordered:

## 2016-07-27 ENCOUNTER — Encounter (HOSPITAL_COMMUNITY)
Admission: RE | Admit: 2016-07-27 | Discharge: 2016-07-27 | Disposition: A | Discharge status: Home / Self Care | Payer: Medicare Other | Source: Skilled Nursing Facility | Attending: Internal Medicine | Admitting: Internal Medicine

## 2016-07-27 DIAGNOSIS — F0390 Unspecified dementia without behavioral disturbance: Secondary | ICD-10-CM | POA: Diagnosis present

## 2016-07-27 DIAGNOSIS — Z9181 History of falling: Secondary | ICD-10-CM | POA: Diagnosis not present

## 2016-07-27 DIAGNOSIS — E538 Deficiency of other specified B group vitamins: Secondary | ICD-10-CM | POA: Insufficient documentation

## 2016-07-27 LAB — CBC WITH DIFFERENTIAL/PLATELET
Basophils Absolute: 0 10*3/uL (ref 0.0–0.1)
Basophils Relative: 1 %
Eosinophils Absolute: 0.3 10*3/uL (ref 0.0–0.7)
Eosinophils Relative: 4 %
HCT: 39.2 % (ref 36.0–46.0)
Hemoglobin: 12.6 g/dL (ref 12.0–15.0)
Lymphocytes Relative: 33 %
Lymphs Abs: 2.2 10*3/uL (ref 0.7–4.0)
MCH: 30.1 pg (ref 26.0–34.0)
MCHC: 32.1 g/dL (ref 30.0–36.0)
MCV: 93.8 fL (ref 78.0–100.0)
Monocytes Absolute: 0.5 10*3/uL (ref 0.1–1.0)
Monocytes Relative: 8 %
Neutro Abs: 3.6 10*3/uL (ref 1.7–7.7)
Neutrophils Relative %: 54 %
Platelets: 238 10*3/uL (ref 150–400)
RBC: 4.18 MIL/uL (ref 3.87–5.11)
RDW: 15.2 % (ref 11.5–15.5)
WBC: 6.6 10*3/uL (ref 4.0–10.5)

## 2016-07-27 LAB — COMPREHENSIVE METABOLIC PANEL
ALT: 11 U/L — ABNORMAL LOW (ref 14–54)
AST: 16 U/L (ref 15–41)
Albumin: 3.2 g/dL — ABNORMAL LOW (ref 3.5–5.0)
Alkaline Phosphatase: 62 U/L (ref 38–126)
Anion gap: 4 — ABNORMAL LOW (ref 5–15)
BUN: 16 mg/dL (ref 6–20)
CO2: 26 mmol/L (ref 22–32)
Calcium: 8.6 mg/dL — ABNORMAL LOW (ref 8.9–10.3)
Chloride: 109 mmol/L (ref 101–111)
Creatinine, Ser: 0.93 mg/dL (ref 0.44–1.00)
GFR calc Af Amer: >60 mL/min (ref >60)
GFR calc non Af Amer: 53 mL/min — ABNORMAL LOW (ref >60)
Glucose, Bld: 92 mg/dL (ref 65–99)
Potassium: 4 mmol/L (ref 3.5–5.1)
Sodium: 139 mmol/L (ref 135–145)
Total Bilirubin: 0.6 mg/dL (ref 0.3–1.2)
Total Protein: 6.6 g/dL (ref 6.5–8.1)

## 2016-07-31 DIAGNOSIS — H26493 Other secondary cataract, bilateral: Secondary | ICD-10-CM | POA: Diagnosis not present

## 2016-07-31 DIAGNOSIS — Z961 Presence of intraocular lens: Secondary | ICD-10-CM | POA: Diagnosis not present

## 2016-08-23 ENCOUNTER — Encounter: Payer: Self-pay | Admitting: Internal Medicine

## 2016-08-23 NOTE — Progress Notes (Signed)
Location:   [facility] Room Number: 141/D Place of Service:  SNF (31) Provider:  Freddi Starr, MD  Patient Care Team: Virgie Dad, MD as PCP - General (Internal Medicine)  Extended Emergency Contact Information Primary Emergency Contact: [name], [place] [address] [place] Phone: [phone] Relation: Friend Secondary Emergency Contact: Brownley,[name]  [place] Phone: [phone] Relation: Daughter  Code Status:  Full Code Goals of care: Advanced Directive information Advanced Directives 08/23/2016  Does patient have an advance directive? Yes  Type of Advance Directive (No Data)  Does patient want to make changes to advanced directive? No - Patient declined  Copy of advanced directive(s) in chart? Yes     Chief Complaint  Patient presents with  . Medical Management of Chronic Issues    Routine Visit    HPI:  Pt is a 82 y.o. female seen today for medical management of chronic diseases.     Past Medical History:  Diagnosis Date  . Breast cancer ([place])   . Cognitive communication deficit   . Dementia   . Difficulty in walking(719.7)   . Muscle weakness (generalized)   . Urinary tract infection, site not specified    Past Surgical History:  Procedure Laterality Date  . COLON SURGERY    . MASTECTOMY, PARTIAL Right     No Known Allergies  Current Outpatient Prescriptions on File Prior to Visit  Medication Sig Dispense Refill  . AMBULATORY NON FORMULARY MEDICATION Magic Cup 113ml by mouth at bedtime to increase nutrition    . Cholecalciferol 1000 units tablet Take 1,000 Units by mouth daily.    . cyanocobalamin 1000 MCG tablet Take 1,000 mcg by mouth daily.    Marland Kitchen donepezil (ARICEPT) 5 MG tablet Take 10 mg by mouth at bedtime. For Dementia  disease     No current facility-administered medications on file prior to visit.     Review of  Systems  Immunization History  Administered Date(s) Administered  . Influenza-Unspecified 07/28/2014, 07/20/2016  . Pneumococcal-Unspecified 08/01/2016   Pertinent  Health Maintenance Due  Topic Date Due  . DEXA SCAN  04/18/2017 (Originally 08/25/1992)  . PNA vac Low Risk Adult (2 of 2 - PCV13) 08/01/2017  . INFLUENZA VACCINE  Completed   No flowsheet data found. Functional Status Survey:    Vitals:   08/23/16 1548  BP: (!) 149/83  Pulse: 87  Resp: 20  Temp: 97.5 F (36.4 C)  TempSrc: Oral  SpO2: 94%   There is no height or weight on file to calculate BMI. Physical Exam  Labs reviewed:  Recent Labs  03/16/16 0841 05/02/16 0740 07/27/16 0715  NA 141 140 139  K 4.1 4.3 4.0  CL 110 106 109  CO2 27 27 26   GLUCOSE 91 113* 92  BUN 16 16 16   CREATININE 0.85 0.94 0.93  CALCIUM 8.6* 8.7* 8.6*    Recent Labs  02/09/16 0750 07/27/16 0715  AST 19 16  ALT 15 11*  ALKPHOS 67 62  BILITOT 0.6 0.6  PROT 7.2 6.6  ALBUMIN 3.4* 3.2*    Recent Labs  02/09/16 0750 05/02/16 0740 07/27/16 0715  WBC 7.1 6.6 6.6  NEUTROABS 4.2 3.9 3.6  HGB 13.9 13.1 12.6  HCT 42.6 40.8 39.2  MCV 94.7 94.9 93.8  PLT 269 241 238   Lab Results  Component Value Date   TSH 4.107 03/16/2016  No results found for: HGBA1C No results found for: CHOL, HDL, LDLCALC, LDLDIRECT, TRIG, CHOLHDL  Significant Diagnostic Results in last 30 days:  No results found.  Assessment/Plan There are no diagnoses linked to this encounter.   Family/ staff Communication:   Labs/tests ordered:       This encounter was created in error - please disregard.

## 2016-08-24 ENCOUNTER — Encounter: Payer: Self-pay | Admitting: Internal Medicine

## 2016-08-24 ENCOUNTER — Non-Acute Institutional Stay (SKILLED_NURSING_FACILITY): Payer: Medicare Other | Admitting: Internal Medicine

## 2016-08-24 DIAGNOSIS — C50911 Malignant neoplasm of unspecified site of right female breast: Secondary | ICD-10-CM

## 2016-08-24 DIAGNOSIS — F0390 Unspecified dementia without behavioral disturbance: Secondary | ICD-10-CM | POA: Diagnosis not present

## 2016-08-24 DIAGNOSIS — S12191D Other nondisplaced fracture of second cervical vertebra, subsequent encounter for fracture with routine healing: Secondary | ICD-10-CM | POA: Diagnosis not present

## 2016-08-24 DIAGNOSIS — I10 Essential (primary) hypertension: Secondary | ICD-10-CM

## 2016-08-24 NOTE — Progress Notes (Signed)
Location:   [facility] Room Number: 141/D Place of Service:  SNF (31) Provider:  Freddi Starr, MD  Patient Care Team: Virgie Dad, MD as PCP - General (Internal Medicine)  Extended Emergency Contact Information Primary Emergency Contact: [name],  [address] [place] Phone: [phone] Relation: Friend Secondary Emergency Contact: Dortch,[name]  [place] Phone: [phone] Relation: Daughter  Code Status:  Full Code Goals of care: Advanced Directive information Advanced Directives 08/24/2016  Does patient have an advance directive? Yes  Type of Advance Directive (No Data)  Does patient want to make changes to advanced directive? No - Patient declined  Copy of advanced directive(s) in chart? Yes     Chief Complaint  Patient presents with  . Medical Management of Chronic Issues    Routine Visit  Medical management of chronic medical conditions including cervical neck fracture-dementia-hypertension-history syncope-    History of present illness  Pt is a 82 y.o. female seen today for medical management of chronic diseases. As noted above.  She continues to be quite stable and has no complaints which is baseline for her.  Nursing staff does not report any issues  She has a history of dementia but is doing well with supportive care.  She also has a history of hypertension list of her blood pressures appear to be largely stable-the highest one I see recently is 149/83 but her baseline appears to be more 120/57-114/55.  She initially came air after going to the ER because she was found in her car port with a hematoma to her head as well as a laceration.  She also sustained a C2 cervical fracture with an epidural hematoma-neurosurgery was consulted and recommended nonoperative management with a cervical collar-and she did quite well with this she is not really  complaining of any neck pain dizziness headache or neurologic issues currently  Regards to concern about syncope this was discussed with cardiology in the hospital of workup was needed unless symptoms persist in she has not really been symptomatic.  She also has a history of breast carcinoma status post right modified radical mastectomy-which has been stable for some time as well.  She also has a history of right hemo-colectomy secondary to what was thought to be a benign colonic neoplasm.      Past Medical History:  Diagnosis Date  . Breast cancer ([place])   . Cognitive communication deficit   . Dementia   . Difficulty in walking(719.7)   . Muscle weakness (generalized)   . Urinary tract infection, site not specified    Past Surgical History:  Procedure Laterality Date  . COLON SURGERY    . MASTECTOMY, PARTIAL Right     No Known Allergies  Current Outpatient Prescriptions on File Prior to Visit  Medication Sig Dispense Refill  . AMBULATORY NON FORMULARY MEDICATION Magic Cup 175ml by mouth at bedtime to increase nutrition    . Cholecalciferol 1000 units tablet Take 1,000 Units by mouth daily.    . cyanocobalamin 1000 MCG tablet Take 1,000 mcg by mouth daily.    Marland Kitchen donepezil (ARICEPT) 5 MG tablet Take 10 mg by mouth at bedtime. For Dementia  disease     No current facility-administered medications on file prior to visit.      Review of Systems   General is not complaining of any fever or chills says nothing is bothering her currently.  Skin is not complaining of any itching or rashes.  Head ears eyes nose mouth and throat does not complain of any visual changes continues with prescription lenses.  Resp-- does not complain of any shortness breath or cough .  Cardiac no complaints of chest pain. Has some mild lower extremity edema which appears to be baseline  GI is not complaining of abdominal discomfort nausea vomiting diarrhea or constipation previous history of  right colectomy in 2006.  GU is not complaining of dysuria.  Neurologic does not complain of any headaches dizziness or syncopal feelings or numbness does have a history of cervical spine fracture is noted above.  Psych some history of dementia again this is been assessed by Dr. Linna Darner thought to be mild she is now on B12 supplementation Continues to well with supportive care.   Immunization History  Administered Date(s) Administered  . Influenza-Unspecified 07/28/2014, 07/20/2016  . Pneumococcal-Unspecified 08/01/2016   Pertinent  Health Maintenance Due  Topic Date Due  . DEXA SCAN  04/18/2017 (Originally 08[birth year]25/1992)  . PNA vac Low Risk Adult (2 of 2 - PCV13) 08/01/2017  . INFLUENZA VACCINE  Completed   No flowsheet data found. Functional Status Survey:    Vitals:   08/24/16 1556  BP: (!) 149/83  Pulse: 87  Resp: 20  Temp: 97.5 F (36.4 C)  TempSrc: Oral  SpO2: 94%  Note baseline blood pressures appear to be lower including 120/57-114/55-there is some variation but I do not note per chart review consistent elevations.  Her weight is 155.2 this is up 2 pounds over previous weight There is no height or weight on file to calculate BMI. Physical Exam In general this is a pleasant elderly female in no distress sitting comfortably in her wheelchair.  Her skin is warm and dry.  Eyes visual acuity appears grossly intact she has prescription lenses sclera and conjunctiva are clear.  Oropharynx clear mucous membranes moist.  Chest is clear to auscultation there is no labored breathing.  Heart is regular rate and rhythm with somewhat distant heart sounds- without murmur gallop or rub she has very minimal lower extremity edema bilaterally pedal pulses are intact bilaterally  Abdomen soft nontender positive bowel sounds with a well-healed surgical scar.  Muscle skeletal--she ambulates in wheelchair moves all extremities 4 with baseline strength  Neurologic is  grossly intact her speech is clear.  Psych she is grossly alert and oriented at times has confusion but can carry on an   appropriate conversation--  Labs reviewed:  Recent Labs  03/16/16 0841 05/02/16 0740 07/27/16 0715  NA 141 140 139  K 4.1 4.3 4.0  CL 110 106 109  CO2 27 27 26   GLUCOSE 91 113* 92  BUN 16 16 16   CREATININE 0.85 0.94 0.93  CALCIUM 8.6* 8.7* 8.6*    Recent Labs  02/09/16 0750 07/27/16 0715  AST 19 16  ALT 15 11*  ALKPHOS 67 62  BILITOT 0.6 0.6  PROT 7.2 6.6  ALBUMIN 3.4* 3.2*    Recent Labs  02/09/16 0750 05/02/16 0740 07/27/16 0715  WBC 7.1 6.6 6.6  NEUTROABS 4.2 3.9 3.6  HGB 13.9 13.1 12.6  HCT 42.6 40.8 39.2  MCV 94.7 94.9 93.8  PLT 269 241 238   Lab Results  Component Value Date   TSH 4.107 03/16/2016   No results found for: HGBA1C No results found for: CHOL, HDL, LDLCALC, LDLDIRECT, TRIG, CHOLHDL  Significant Diagnostic Results in last 30 days:  No results found.  Assessment/Plan    #  1-history of cervical neck fracture followed by neurosurgery in the past this appears to be stable she does not appear to have significant residual effects-follow-up x-rays showed stable exam with no instability with flexion or extension. She is not really complaining of any pain today continues to ambulate in wheelchair without difficulty  #2 dementia this appears to be stable she is functioning well with supportive care.--She continues on Aricept  B12 level was borderline low at 240 on lab done in late May this is being supplemented Most recent lab was 462 on 05/02/2016.  #3 history of question hypertension as noted above blood pressure appears stable on no medication. I do not see consistent systolic elevations  #4 history of syncope apparently this was worked up during her hospitalization. No aggressive workup unless symptoms persist and they have not at this point will monitor.  #5 history of right breast cancer in 2008 status post  modified mastectomy-it appears this was ductal in situ and patient and did not want anything done aggressively  #6 some history of weight loss-this appears to have stabilized her weight actually is up 2 pounds since last month     [id number]

## 2016-08-26 DIAGNOSIS — I10 Essential (primary) hypertension: Secondary | ICD-10-CM

## 2016-08-26 HISTORY — DX: Essential (primary) hypertension: I10

## 2016-09-03 ENCOUNTER — Encounter: Payer: Self-pay | Admitting: Internal Medicine

## 2016-09-03 ENCOUNTER — Non-Acute Institutional Stay (SKILLED_NURSING_FACILITY): Payer: Medicare Other | Admitting: Internal Medicine

## 2016-09-03 DIAGNOSIS — R0989 Other specified symptoms and signs involving the circulatory and respiratory systems: Secondary | ICD-10-CM

## 2016-09-03 DIAGNOSIS — R059 Cough, unspecified: Secondary | ICD-10-CM

## 2016-09-03 DIAGNOSIS — R05 Cough: Secondary | ICD-10-CM

## 2016-09-03 NOTE — Progress Notes (Signed)
Location:   [facility] Room Number: 141/D Place of Service:  SNF [phone]) Provider:  Granville Lewis  Virgie Dad, MD  Patient Care Team: Virgie Dad, MD as PCP - General (Internal Medicine)  Extended Emergency Contact Information Primary Emergency Contact: [name], [address] [address] [place] Phone: [phone] Relation: Friend Secondary Emergency Contact: Penland,[name]  [place] Phone: [phone] Relation: Daughter  Code Status:  Full Code Goals of care: Advanced Directive information Advanced Directives 09/03/2016  Does patient have an advance directive? Yes  Type of Advance Directive (No Data)  Does patient want to make changes to advanced directive? No - Patient declined  Copy of advanced directive(s) in chart? Yes     Chief Complaint  Patient presents with  . Acute Visit    Cough    HPI:  Pt is a 82 y.o. female seen today for an acute visit forNursing concerns the patient has developed a cough with some congestion.  Patient herself is not complaining of any cough-however she does not really complain at all usually-she has been remarkably stable during her stay here-she does not really have a significant history of pulmonary issues.  Vital signs appear to be stable she is not complaining of any fever or chills sore throat or headache.      Past Medical History:  Diagnosis Date  . Breast cancer ([facility])   . Cognitive communication deficit   . Dementia   . Difficulty in walking(719.7)   . Muscle weakness (generalized)   . Urinary tract infection, site not specified    Past Surgical History:  Procedure Laterality Date  . COLON SURGERY    . MASTECTOMY, PARTIAL Right     No Known Allergies  Current Outpatient Prescriptions on File Prior to Visit  Medication Sig Dispense Refill  . AMBULATORY NON FORMULARY MEDICATION Magic Cup 168ml by mouth at bedtime to increase  nutrition    . Cholecalciferol 1000 units tablet Take 1,000 Units by mouth daily.    . cyanocobalamin 1000 MCG tablet Take 1,000 mcg by mouth daily.    Marland Kitchen donepezil (ARICEPT) 5 MG tablet Take 10 mg by mouth at bedtime. For Dementia  disease     No current facility-administered medications on file prior to visit.      Review of Systems   In general is not complaining of any fever or chills sitting in her wheelchair comfortably.  Her skin is warm and dry she is not complaining of any diaphoresis.  Head ears eyes nose mouth and throat does not complaining of any sore throat or discharge from her eyes.  Cardiac no chest pain.  Respiratory is not complaining of shortness of breath nursing staff has noted a nonproductive cough and some chest congestion.  GI does not complain of nausea vomiting diarrhea constipation or abdominal pain.  Musculoskeletal is not complaining of joint pain today.  Neurologic is not complaining of any dizziness or headache.  I psych continues to be pleasant does not complain of any depression or anxiety  Immunization History  Administered Date(s) Administered  . Influenza-Unspecified 07/28/2014, 07/20/2016  . Pneumococcal-Unspecified 08/01/2016   Pertinent  Health Maintenance Due  Topic Date Due  . DEXA SCAN  04/18/2017 (Originally 08/25/1992)  . PNA vac Low Risk Adult (2 of 2 - PCV13) 08/01/2017  . INFLUENZA VACCINE  Completed   No flowsheet data found. Functional Status Survey:  Vitals:   09/03/16 1329  BP: (!) 112/57  Pulse: 72  Resp: 16  Temp: 97 F (36.1 C)  TempSrc: Oral   There is no height or weight on file to calculate BMI. Physical Exam   In general this is a pleasant elderly female in no distress sitting comfortably in her wheelchair.  Her skin is warm and dry no evidence of diaphoresis. Advised no discharge sclerae and conjunctivae are clear visual acuity appears grossly intact.  Oropharynx is clear mucous membranes  moist.  Neck could not appreciate any adenopathy.  Chest she does have some mild bronchial sounds diffuse there is no labored breathing.  Heart is regular rate and rhythm without murmur gallop or rub she has very minimal lower extremity edema bilaterally.  Abdomen soft nontender with positive bowel sounds.  Muscle skeletal ambulates in a wheelchair moves all her extremities 4 at her baseline.  Neurologic is grossly intact her speech is clear no lateralizing findings.  Psych she is pleasant and appropriate which is her baseline  Labs reviewed:  Recent Labs  03/16/16 0841 05/02/16 0740 07/27/16 0715  NA 141 140 139  K 4.1 4.3 4.0  CL 110 106 109  CO2 27 27 26   GLUCOSE 91 113* 92  BUN 16 16 16   CREATININE 0.85 0.94 0.93  CALCIUM 8.6* 8.7* 8.6*    Recent Labs  02/09/16 0750 07/27/16 0715  AST 19 16  ALT 15 11*  ALKPHOS 67 62  BILITOT 0.6 0.6  PROT 7.2 6.6  ALBUMIN 3.4* 3.2*    Recent Labs  02/09/16 0750 05/02/16 0740 07/27/16 0715  WBC 7.1 6.6 6.6  NEUTROABS 4.2 3.9 3.6  HGB 13.9 13.1 12.6  HCT 42.6 40.8 39.2  MCV 94.7 94.9 93.8  PLT 269 241 238   Lab Results  Component Value Date   TSH 4.107 03/16/2016   No results found for: HGBA1C No results found for: CHOL, HDL, LDLCALC, LDLDIRECT, TRIG, CHOLHDL  Significant Diagnostic Results in last 30 days:  No results found.  Assessment/Plan  #1 cough with some chest congestion-will obtain a two-view chest x-ray-also since there is significant respiratory issues in the facility will be aggressive and start Zithromax 500 mg daily for 1 day 250 mg daily for 4 additional days-also vital signs pulse ox every shift for 48 hours.  Also will start Mucinex 600 mg twice a day for 7 days.  Also will start a probiotic twice a day for 7 days.  [id number].      Oralia Manis, [place]

## 2016-09-25 ENCOUNTER — Encounter: Payer: Self-pay | Admitting: Internal Medicine

## 2016-09-25 NOTE — Progress Notes (Signed)
This encounter was created in error - please disregard.

## 2016-10-04 ENCOUNTER — Non-Acute Institutional Stay (SKILLED_NURSING_FACILITY): Payer: Medicare Other | Admitting: Internal Medicine

## 2016-10-04 ENCOUNTER — Encounter: Payer: Self-pay | Admitting: Internal Medicine

## 2016-10-04 DIAGNOSIS — F0390 Unspecified dementia without behavioral disturbance: Secondary | ICD-10-CM | POA: Diagnosis not present

## 2016-10-04 DIAGNOSIS — Z0000 Encounter for general adult medical examination without abnormal findings: Secondary | ICD-10-CM

## 2016-10-04 NOTE — Progress Notes (Signed)
Location:   [facility] Room Number: 141/D Place of Service:  SNF (31) Provider:  Meredith Staggers.Theadore Nan, MD  Patient Care Team: Virgie Dad, MD as PCP - General (Internal Medicine)  Extended Emergency Contact Information Primary Emergency Contact: [name], [place] [address] [place] Phone: [phone] Relation: Friend Secondary Emergency Contact: Clubb,[name]  [place] Phone: [phone] Relation: Daughter  Code Status:  Full Code Goals of care: Advanced Directive information Advanced Directives 10/04/2016  Does Patient Have a Medical Advance Directive? Yes  Type of Advance Directive (No Data)  Does patient want to make changes to medical advance directive? No - Patient declined  Copy of [place] in Chart? -     Chief Complaint  Patient presents with  . Medical Management of Chronic Issues    Routine Visit    HPI:  Pt is a 82 y.o. female seen today for medical management of chronic diseases.   Pateint has history of Dementia and has been LTC resident in the facility. She also has h/o Breast cancer s/p Modified Mastectomy, S/P right hemi colectomy and past history of C2 fracture. Patient has been stable in the facility. She had Upper respiratory infection and was treated with antibiotics. Her weight has been stable at 156 lbs. Patient had no new complains . She says her cough is resolved and her appetite is good.  Past Medical History:  Diagnosis Date  . Breast cancer ([facility])   . Cognitive communication deficit   . Dementia   . Difficulty in walking(719.7)   . Muscle weakness (generalized)   . Urinary tract infection, site not specified    Past Surgical History:  Procedure Laterality Date  . COLON SURGERY    . MASTECTOMY, PARTIAL Right     No Known Allergies Current Outpatient Prescriptions on File Prior to Visit  Medication Sig Dispense  Refill  . AMBULATORY NON FORMULARY MEDICATION Magic Cup 149ml by mouth at bedtime to increase nutrition    . Cholecalciferol 1000 units tablet Take 1,000 Units by mouth daily.    . cyanocobalamin 1000 MCG tablet Take 1,000 mcg by mouth daily.    Marland Kitchen donepezil (ARICEPT) 5 MG tablet Take 10 mg by mouth at bedtime. For Dementia  disease     No current facility-administered medications on file prior to visit.     Review of Systems Review of Systems  Constitutional: Negative for activity change, appetite change, chills, diaphoresis, fatigue and fever.  HENT: Negative for mouth sores, postnasal drip, rhinorrhea, sinus pain and sore throat.   Respiratory: Negative for apnea, cough, chest tightness, shortness of breath and wheezing.   Cardiovascular: Negative for chest pain, palpitations and leg swelling.  Gastrointestinal: Negative for abdominal distention, abdominal pain, constipation, diarrhea, nausea and vomiting.  Genitourinary: Negative for dysuria and frequency.  Musculoskeletal: Negative for arthralgias, joint swelling and myalgias.  Skin: Negative for rash.  Neurological: Negative for dizziness, syncope, weakness, light-headedness and numbness.  Psychiatric/Behavioral: Negative for behavioral problems, confusion and sleep disturbance.   Immunization History  Administered Date(s) Administered  . Influenza-Unspecified 07/28/2014, 07/20/2016  . Pneumococcal-Unspecified 08/01/2016   Pertinent  Health Maintenance Due  Topic Date Due  . DEXA SCAN  04/18/2017 (Originally 08/25/1992)  . PNA vac Low Risk Adult (2 of 2 - PCV13) 08/01/2017  . INFLUENZA VACCINE  Completed   No flowsheet data found. Functional Status Survey:  Vitals:   10/04/16 1722  BP: 102/67  Pulse: 68  Resp: (!) 24  Temp: 97.8 F (36.6 C)   There is no height or weight on file to calculate BMI. Physical Exam  Constitutional: She appears well-developed and well-nourished.  HENT:  Head: Normocephalic.    Mouth/Throat: Oropharynx is clear and moist.  Eyes: Pupils are equal, round, and reactive to light.  Cardiovascular: Normal rate and normal heart sounds.   No murmur heard. Pulmonary/Chest: Effort normal and breath sounds normal. No respiratory distress. She has no wheezes. She has no rales.  Abdominal: Soft. Bowel sounds are normal. She exhibits no distension. There is no tenderness. There is no rebound.  Musculoskeletal: She exhibits no edema.  Lymphadenopathy:    She has no cervical adenopathy.  Neurological: She is alert.  Skin: Skin is warm and dry.  Psychiatric: She has a normal mood and affect. Her behavior is normal. Thought content normal.    Labs reviewed:  Recent Labs  03/16/16 0841 05/02/16 0740 07/27/16 0715  NA 141 140 139  K 4.1 4.3 4.0  CL 110 106 109  CO2 27 27 26   GLUCOSE 91 113* 92  BUN 16 16 16   CREATININE 0.85 0.94 0.93  CALCIUM 8.6* 8.7* 8.6*    Recent Labs  02/09/16 0750 07/27/16 0715  AST 19 16  ALT 15 11*  ALKPHOS 67 62  BILITOT 0.6 0.6  PROT 7.2 6.6  ALBUMIN 3.4* 3.2*    Recent Labs  02/09/16 0750 05/02/16 0740 07/27/16 0715  WBC 7.1 6.6 6.6  NEUTROABS 4.2 3.9 3.6  HGB 13.9 13.1 12.6  HCT 42.6 40.8 39.2  MCV 94.7 94.9 93.8  PLT 269 241 238   Lab Results  Component Value Date   TSH 4.107 03/16/2016   No results found for: HGBA1C No results found for: CHOL, HDL, LDLCALC, LDLDIRECT, TRIG, CHOLHDL  Significant Diagnostic Results in last 30 days:  No results found.  Assessment/Plan  Dementia Continue Aricept. Patient is stable and doing well. Mood is good. Continue supportive care.  Health Maintenance Patient on Vitamin B 12 . B12 level was normal in 07/17 Also follow Vit D and calcium level. Will also do DEXA scan to evaluate BMD.   Family/ staff Communication:   Labs/tests ordered:  DEXA scan, Serum Calcium, PTH level, and Vit D

## 2016-10-05 ENCOUNTER — Encounter (HOSPITAL_COMMUNITY)
Admission: RE | Admit: 2016-10-05 | Discharge: 2016-10-05 | Disposition: A | Discharge status: Home / Self Care | Payer: Medicare Other | Source: Skilled Nursing Facility | Attending: Internal Medicine | Admitting: Internal Medicine

## 2016-10-05 DIAGNOSIS — E559 Vitamin D deficiency, unspecified: Secondary | ICD-10-CM | POA: Insufficient documentation

## 2016-10-05 DIAGNOSIS — Z9181 History of falling: Secondary | ICD-10-CM | POA: Diagnosis not present

## 2016-10-05 DIAGNOSIS — E538 Deficiency of other specified B group vitamins: Secondary | ICD-10-CM | POA: Insufficient documentation

## 2016-10-05 DIAGNOSIS — F0390 Unspecified dementia without behavioral disturbance: Secondary | ICD-10-CM | POA: Diagnosis present

## 2016-10-05 LAB — CALCIUM: Calcium: 8.7 mg/dL — ABNORMAL LOW (ref 8.9–10.3)

## 2016-10-06 LAB — PARATHYROID HORMONE, INTACT (NO CA): PTH: 35 pg/mL (ref 15–65)

## 2016-10-06 LAB — VITAMIN D 25 HYDROXY (VIT D DEFICIENCY, FRACTURES): Vit D, 25-Hydroxy: 40.8 ng/mL (ref 30.0–100.0)

## 2016-10-16 DIAGNOSIS — M810 Age-related osteoporosis without current pathological fracture: Secondary | ICD-10-CM | POA: Diagnosis not present

## 2016-10-23 DIAGNOSIS — Z9181 History of falling: Secondary | ICD-10-CM | POA: Diagnosis not present

## 2016-10-23 DIAGNOSIS — M6281 Muscle weakness (generalized): Secondary | ICD-10-CM | POA: Diagnosis not present

## 2016-10-23 DIAGNOSIS — R279 Unspecified lack of coordination: Secondary | ICD-10-CM | POA: Diagnosis not present

## 2016-10-24 DIAGNOSIS — R279 Unspecified lack of coordination: Secondary | ICD-10-CM | POA: Diagnosis not present

## 2016-10-24 DIAGNOSIS — M6281 Muscle weakness (generalized): Secondary | ICD-10-CM | POA: Diagnosis not present

## 2016-10-24 DIAGNOSIS — Z9181 History of falling: Secondary | ICD-10-CM | POA: Diagnosis not present

## 2016-10-25 DIAGNOSIS — Z9181 History of falling: Secondary | ICD-10-CM | POA: Diagnosis not present

## 2016-10-25 DIAGNOSIS — M6281 Muscle weakness (generalized): Secondary | ICD-10-CM | POA: Diagnosis not present

## 2016-10-25 DIAGNOSIS — R279 Unspecified lack of coordination: Secondary | ICD-10-CM | POA: Diagnosis not present

## 2016-10-26 DIAGNOSIS — M6281 Muscle weakness (generalized): Secondary | ICD-10-CM | POA: Diagnosis not present

## 2016-10-26 DIAGNOSIS — R279 Unspecified lack of coordination: Secondary | ICD-10-CM | POA: Diagnosis not present

## 2016-10-26 DIAGNOSIS — Z9181 History of falling: Secondary | ICD-10-CM | POA: Diagnosis not present

## 2016-10-29 DIAGNOSIS — R279 Unspecified lack of coordination: Secondary | ICD-10-CM | POA: Diagnosis not present

## 2016-10-29 DIAGNOSIS — M6281 Muscle weakness (generalized): Secondary | ICD-10-CM | POA: Diagnosis not present

## 2016-10-29 DIAGNOSIS — Z9181 History of falling: Secondary | ICD-10-CM | POA: Diagnosis not present

## 2016-10-30 DIAGNOSIS — R279 Unspecified lack of coordination: Secondary | ICD-10-CM | POA: Diagnosis not present

## 2016-10-30 DIAGNOSIS — M6281 Muscle weakness (generalized): Secondary | ICD-10-CM | POA: Diagnosis not present

## 2016-10-30 DIAGNOSIS — Z9181 History of falling: Secondary | ICD-10-CM | POA: Diagnosis not present

## 2016-10-31 DIAGNOSIS — Z9181 History of falling: Secondary | ICD-10-CM | POA: Diagnosis not present

## 2016-10-31 DIAGNOSIS — M6281 Muscle weakness (generalized): Secondary | ICD-10-CM | POA: Diagnosis not present

## 2016-10-31 DIAGNOSIS — R279 Unspecified lack of coordination: Secondary | ICD-10-CM | POA: Diagnosis not present

## 2016-11-06 DIAGNOSIS — M6281 Muscle weakness (generalized): Secondary | ICD-10-CM | POA: Diagnosis not present

## 2016-11-06 DIAGNOSIS — Z9181 History of falling: Secondary | ICD-10-CM | POA: Diagnosis not present

## 2016-11-06 DIAGNOSIS — R279 Unspecified lack of coordination: Secondary | ICD-10-CM | POA: Diagnosis not present

## 2016-11-08 DIAGNOSIS — Z9181 History of falling: Secondary | ICD-10-CM | POA: Diagnosis not present

## 2016-11-08 DIAGNOSIS — M6281 Muscle weakness (generalized): Secondary | ICD-10-CM | POA: Diagnosis not present

## 2016-11-08 DIAGNOSIS — R279 Unspecified lack of coordination: Secondary | ICD-10-CM | POA: Diagnosis not present

## 2016-11-10 DIAGNOSIS — M6281 Muscle weakness (generalized): Secondary | ICD-10-CM | POA: Diagnosis not present

## 2016-11-10 DIAGNOSIS — R279 Unspecified lack of coordination: Secondary | ICD-10-CM | POA: Diagnosis not present

## 2016-11-10 DIAGNOSIS — Z9181 History of falling: Secondary | ICD-10-CM | POA: Diagnosis not present

## 2016-11-12 DIAGNOSIS — Z9181 History of falling: Secondary | ICD-10-CM | POA: Diagnosis not present

## 2016-11-12 DIAGNOSIS — M6281 Muscle weakness (generalized): Secondary | ICD-10-CM | POA: Diagnosis not present

## 2016-11-12 DIAGNOSIS — R279 Unspecified lack of coordination: Secondary | ICD-10-CM | POA: Diagnosis not present

## 2016-11-13 DIAGNOSIS — M6281 Muscle weakness (generalized): Secondary | ICD-10-CM | POA: Diagnosis not present

## 2016-11-13 DIAGNOSIS — R279 Unspecified lack of coordination: Secondary | ICD-10-CM | POA: Diagnosis not present

## 2016-11-13 DIAGNOSIS — Z9181 History of falling: Secondary | ICD-10-CM | POA: Diagnosis not present

## 2016-11-14 DIAGNOSIS — Z9181 History of falling: Secondary | ICD-10-CM | POA: Diagnosis not present

## 2016-11-14 DIAGNOSIS — R279 Unspecified lack of coordination: Secondary | ICD-10-CM | POA: Diagnosis not present

## 2016-11-14 DIAGNOSIS — M6281 Muscle weakness (generalized): Secondary | ICD-10-CM | POA: Diagnosis not present

## 2016-11-15 DIAGNOSIS — R279 Unspecified lack of coordination: Secondary | ICD-10-CM | POA: Diagnosis not present

## 2016-11-15 DIAGNOSIS — Z9181 History of falling: Secondary | ICD-10-CM | POA: Diagnosis not present

## 2016-11-15 DIAGNOSIS — M6281 Muscle weakness (generalized): Secondary | ICD-10-CM | POA: Diagnosis not present

## 2016-11-16 ENCOUNTER — Encounter: Payer: Self-pay | Admitting: Internal Medicine

## 2016-11-16 DIAGNOSIS — Z9181 History of falling: Secondary | ICD-10-CM | POA: Diagnosis not present

## 2016-11-16 DIAGNOSIS — M6281 Muscle weakness (generalized): Secondary | ICD-10-CM | POA: Diagnosis not present

## 2016-11-16 DIAGNOSIS — R279 Unspecified lack of coordination: Secondary | ICD-10-CM | POA: Diagnosis not present

## 2016-11-16 NOTE — Progress Notes (Signed)
Location:   [facility] Room Number: 141/D Place of Service:  SNF (31) Provider:  Freddi Starr, MD  Patient Care Team: Virgie Dad, MD as PCP - General (Internal Medicine)  Extended Emergency Contact Information Primary Emergency Contact: [name], [place] [address] [place] Phone: [phone] Relation: Friend Secondary Emergency Contact: Baumgarner,[name]  [place] Phone: [phone] Relation: Daughter  Code Status:  Full Code Goals of care: Advanced Directive information Advanced Directives 11/16/2016  Does Patient Have a Medical Advance Directive? Yes  Type of Advance Directive (No Data)  Does patient want to make changes to medical advance directive? No - Patient declined  Copy of [place] in Chart? -     Chief Complaint  Patient presents with  . Medical Management of Chronic Issues    Routine Visit    HPI:  Pt is a 83 y.o. female seen today for medical management of chronic diseases.     Past Medical History:  Diagnosis Date  . Breast cancer ([facility])   . Cognitive communication deficit   . Dementia   . Difficulty in walking(719.7)   . Muscle weakness (generalized)   . Urinary tract infection, site not specified    Past Surgical History:  Procedure Laterality Date  . COLON SURGERY    . MASTECTOMY, PARTIAL Right     No Known Allergies  Current Outpatient Prescriptions on File Prior to Visit  Medication Sig Dispense Refill  . AMBULATORY NON FORMULARY MEDICATION Magic Cup 19ml by mouth at bedtime to increase nutrition    . Cholecalciferol 1000 units tablet Take 1,000 Units by mouth daily.    Marland Kitchen donepezil (ARICEPT) 5 MG tablet Take 10 mg by mouth at bedtime. For Dementia  disease     No current facility-administered medications on file prior to visit.      Review of Systems  Immunization History  Administered Date(s) Administered   . Influenza-Unspecified 07/28/2014, 07/20/2016  . Pneumococcal-Unspecified 08/01/2016   Pertinent  Health Maintenance Due  Topic Date Due  . DEXA SCAN  04/18/2017 (Originally 08/25/1992)  . PNA vac Low Risk Adult (2 of 2 - PCV13) 08/01/2017  . INFLUENZA VACCINE  Completed   No flowsheet data found. Functional Status Survey:    Vitals:   11/16/16 0846  BP: 121/68  Pulse: 60  Resp: 20  Temp: 97 F (36.1 C)  TempSrc: Oral  SpO2: 94%  Weight: 158 lb 9.6 oz (71.9 kg)  Height: 5\' 3"  (1.6 m)   Body mass index is 28.09 kg/m. Physical Exam  Labs reviewed:  Recent Labs  03/16/16 0841 05/02/16 0740 07/27/16 0715 10/05/16 0700  NA 141 140 139  --   K 4.1 4.3 4.0  --   CL 110 106 109  --   CO2 27 27 26   --   GLUCOSE 91 113* 92  --   BUN 16 16 16   --   CREATININE 0.85 0.94 0.93  --   CALCIUM 8.6* 8.7* 8.6* 8.7*    Recent Labs  02/09/16 0750 07/27/16 0715  AST 19 16  ALT 15 11*  ALKPHOS 67 62  BILITOT 0.6 0.6  PROT 7.2 6.6  ALBUMIN 3.4* 3.2*    Recent Labs  02/09/16 0750 05/02/16 0740 07/27/16 0715  WBC 7.1 6.6 6.6  NEUTROABS 4.2 3.9 3.6  HGB 13.9 13.1 12.6  HCT 42.6 40.8 39.2  MCV 94.7 94.9 93.8  PLT 269 241 238   Lab Results  Component Value Date   TSH 4.107 03/16/2016   No results found for: HGBA1C No results found for: CHOL, HDL, LDLCALC, LDLDIRECT, TRIG, CHOLHDL  Significant Diagnostic Results in last 30 days:  No results found.  Assessment/Plan There are no diagnoses linked to this encounter.   Family/ staff Communication:  Labs/tests ordered:       This encounter was created in error - please disregard.

## 2016-11-19 DIAGNOSIS — R279 Unspecified lack of coordination: Secondary | ICD-10-CM | POA: Diagnosis not present

## 2016-11-19 DIAGNOSIS — M6281 Muscle weakness (generalized): Secondary | ICD-10-CM | POA: Diagnosis not present

## 2016-11-19 DIAGNOSIS — Z9181 History of falling: Secondary | ICD-10-CM | POA: Diagnosis not present

## 2016-11-20 ENCOUNTER — Non-Acute Institutional Stay (SKILLED_NURSING_FACILITY): Payer: Medicare Other | Admitting: Internal Medicine

## 2016-11-20 ENCOUNTER — Encounter: Payer: Self-pay | Admitting: Internal Medicine

## 2016-11-20 DIAGNOSIS — N289 Disorder of kidney and ureter, unspecified: Secondary | ICD-10-CM

## 2016-11-20 DIAGNOSIS — F0390 Unspecified dementia without behavioral disturbance: Secondary | ICD-10-CM | POA: Diagnosis not present

## 2016-11-20 DIAGNOSIS — S12191D Other nondisplaced fracture of second cervical vertebra, subsequent encounter for fracture with routine healing: Secondary | ICD-10-CM

## 2016-11-20 DIAGNOSIS — I10 Essential (primary) hypertension: Secondary | ICD-10-CM | POA: Diagnosis not present

## 2016-11-20 DIAGNOSIS — R279 Unspecified lack of coordination: Secondary | ICD-10-CM | POA: Diagnosis not present

## 2016-11-20 DIAGNOSIS — M6281 Muscle weakness (generalized): Secondary | ICD-10-CM | POA: Diagnosis not present

## 2016-11-20 DIAGNOSIS — Z9181 History of falling: Secondary | ICD-10-CM | POA: Diagnosis not present

## 2016-11-20 NOTE — Progress Notes (Signed)
Location:   [facility] Room Number: 141/D Place of Service:  SNF (31) Provider:  Freddi Starr, MD  Patient Care Team: Virgie Dad, MD as PCP - General (Internal Medicine)  Extended Emergency Contact Information Primary Emergency Contact: [name], [place] [address] [place] Phone: [phone] Relation: Friend Secondary Emergency Contact: Seib,[name]  [place] Phone: [phone] Relation: Daughter  Code Status:  Full Code Goals of care: Advanced Directive information Advanced Directives 11/20/2016  Does Patient Have a Medical Advance Directive? Yes  Type of Advance Directive (No Data)  Does patient want to make changes to medical advance directive? No - Patient declined  Copy of [place] in Chart? -     Chief Complaint  Patient presents with  . Medical Management of Chronic Issues    Routine Visit  For medical management of chronic medical conditions including dementia-cervical neck fracture-hypertension-history of syncope-  HPI:  Pt is a 83 y.o. female seen today for medical management of chronic diseases as noted above.   She continues to have a period of stability-her weight appears to be stable at 158 pounds.  She has no complaints today.  She has a history of dementia but continues to do well with supportive care.  She does have a listed history of hypertension but blood pressures appear to be stable currently on no medication recent blood pressures 129/59-113/76.  She initially came air after going to the ER because she was found in her car port with a hematoma to her head as well as a laceration.  She also sustained a C2 cervical fracture with an epidural hematoma-neurosurgery was consulted and recommended nonoperative management with a cervical collar-and she did quite well with this she is not really complaining of any neck pain  dizziness headache or neurologic issues currently  Regards to concern about syncope this was discussed with cardiology in the hospital of workup was needed unless symptoms persist in she has not really been symptomatic.  She also has a history of breast carcinoma status post right modified radical mastectomy-which has been stable for some time as well.  She also has a history of right hemo-colectomy secondary to what was thought to be a benign colonic neoplasm.         Past Medical History:  Diagnosis Date  . Breast cancer ([place])   . Cognitive communication deficit   . Dementia   . Difficulty in walking(719.7)   . Muscle weakness (generalized)   . Urinary tract infection, site not specified    Past Surgical History:  Procedure Laterality Date  . COLON SURGERY    . MASTECTOMY, PARTIAL Right     No Known Allergies  Current Outpatient Prescriptions on File Prior to Visit  Medication Sig Dispense Refill  . AMBULATORY NON FORMULARY MEDICATION Magic Cup 110ml by mouth at bedtime to increase nutrition    . calcium carbonate (TUMS) 500 MG chewable tablet Chew 1 tablet by mouth daily.    . Cholecalciferol 1000 units tablet Take 1,000 Units by mouth daily.    Marland Kitchen donepezil (ARICEPT) 5 MG tablet Take 10 mg by mouth at bedtime. For Dementia  disease    . vitamin B-12 (CYANOCOBALAMIN) 1000 MCG tablet Take 1,000 mcg by mouth at bedtime.     No current facility-administered medications on file prior to visit.     Review of Systems General is  not complaining of any fever or chills Has no complaints today.  Skin is not complaining of any itching or rashes.  Head ears eyes nose mouth and throat does not complain of any visual changes continues with prescription lenses.  Resp--does not complain of any shortness breath or cough .  Cardiac no complaints of chest pain. Has some mild lower extremity edema which appears to be baseline  GI is not complaining of abdominal  discomfort nausea vomiting diarrhea or constipation previous history of right colectomy in 2006.  GU does not complain of any dysuria  Neurologic does not complain of any headaches dizziness or syncopal feelings or numbness does have a history of cervical spine fracture is noted above.  Psych some history of dementia again this is been assessed by Dr. Linna Darner thought to be mild she is now on B12 supplementation Continues to well with supportive care.  Immunization History  Administered Date(s) Administered  . Influenza-Unspecified 07/28/2014, 07/20/2016  . Pneumococcal-Unspecified 08/01/2016   Pertinent  Health Maintenance Due  Topic Date Due  . DEXA SCAN  04/18/2017 (Originally 08/25/1992)  . PNA vac Low Risk Adult (2 of 2 - PCV13) 08/01/2017  . INFLUENZA VACCINE  Completed   No flowsheet data found. Functional Status Survey:    Vitals:   11/20/16 1005  BP: (!) 129/59  Pulse: 87  Resp: 20  Temp: 97 F (36.1 C)  TempSrc: Oral  SpO2: 94%  Weight: 158 lb 9.6 oz (71.9 kg)  Height: 5\' 3"  (1.6 m)   Body mass index is 28.09 kg/m. Physical Exam   In general this is a pleasant elderly female in no distresssitting comfortably i on the side of her bed  Her skin is warm and dry.  Eyes visual acuity appears grossly intact she has prescription lenses sclera and conjunctiva are clear.  Oropharynx clear mucous membranes moist.  Chest is clear to auscultation there is no labored breathing.  Heart is regular rate and rhythm - without murmur gallop or rub she has very minimal lower extremity edema  Abdomen soft nontender positive bowel sounds with a well-healed surgical scar.  Muscle skeletal--she ambulates in wheelchair moves all extremities 4 with baseline strength  Neurologic is grossly intact her speech is clear.  Psych she is grossly alert and oriented at times has confusion but can carry on an   appropriate conversation--she is pleasant and appropriate  today   Labs reviewed:  Recent Labs  03/16/16 0841 05/02/16 0740 07/27/16 0715 10/05/16 0700  NA 141 140 139  --   K 4.1 4.3 4.0  --   CL 110 106 109  --   CO2 27 27 26   --   GLUCOSE 91 113* 92  --   BUN 16 16 16   --   CREATININE 0.85 0.94 0.93  --   CALCIUM 8.6* 8.7* 8.6* 8.7*    Recent Labs  02/09/16 0750 07/27/16 0715  AST 19 16  ALT 15 11*  ALKPHOS 67 62  BILITOT 0.6 0.6  PROT 7.2 6.6  ALBUMIN 3.4* 3.2*    Recent Labs  02/09/16 0750 05/02/16 0740 07/27/16 0715  WBC 7.1 6.6 6.6  NEUTROABS 4.2 3.9 3.6  HGB 13.9 13.1 12.6  HCT 42.6 40.8 39.2  MCV 94.7 94.9 93.8  PLT 269 241 238   Lab Results  Component Value Date   TSH 4.107 03/16/2016   No results found for: HGBA1C No results found for: CHOL, HDL, LDLCALC, LDLDIRECT, TRIG, CHOLHDL  Significant Diagnostic Results  in last 30 days:  No results found.  Assessment/Plan       #1-history of cervical neck fracture followed by neurosurgery in the past this appears to be stable she does not appear to have significant residual effects-follow-up x-raysshowed stable exam with no instability with flexion or extension.  she has not really complained of any pain in this regard no headache dizziness neurologic complication  #2 dementia this appears to be stable she is functioning well with supportive care.--She continues on Aricept   At one point her B12 level was low but she has been started on supplementation and this has normalized  #3 history of question hypertension as noted above blood pressure appears stable on no medication. I do not see consistent systolic elevations  #4 history of syncope apparently this was worked up during her hospitalization. No aggressive workup unless symptoms persist and they have not at this point will monitor.  #5 history of right breast cancer in 2008 status post modified mastectomy-it appears this was ductal in situ and patient and did not want anything done  aggressively--  #6 some history of weight loss-this appears to have stabilized  She is also has some history of renal insufficiency which was mild most recent creatinine 0.93 BUN 16 back in early October 2017  Will update a CBC and BMP for updated values.  I do note her calcium was 8.7 on lab done in mid December which showed some stability PTH was within normal limits at 35-vitamin D was 40.8 which is within normal limits  [id number]

## 2016-11-21 ENCOUNTER — Encounter (HOSPITAL_COMMUNITY)
Admission: RE | Admit: 2016-11-21 | Discharge: 2016-11-21 | Disposition: A | Discharge status: Home / Self Care | Payer: Medicare Other | Source: Skilled Nursing Facility | Attending: Internal Medicine | Admitting: Internal Medicine

## 2016-11-21 DIAGNOSIS — Z9181 History of falling: Secondary | ICD-10-CM | POA: Diagnosis not present

## 2016-11-21 DIAGNOSIS — E538 Deficiency of other specified B group vitamins: Secondary | ICD-10-CM | POA: Insufficient documentation

## 2016-11-21 DIAGNOSIS — F0390 Unspecified dementia without behavioral disturbance: Secondary | ICD-10-CM | POA: Insufficient documentation

## 2016-11-21 LAB — BASIC METABOLIC PANEL
Anion gap: 6 (ref 5–15)
BUN: 12 mg/dL (ref 6–20)
CO2: 29 mmol/L (ref 22–32)
Calcium: 8.7 mg/dL — ABNORMAL LOW (ref 8.9–10.3)
Chloride: 106 mmol/L (ref 101–111)
Creatinine, Ser: 0.96 mg/dL (ref 0.44–1.00)
GFR calc Af Amer: 59 mL/min — ABNORMAL LOW (ref >60)
GFR calc non Af Amer: 51 mL/min — ABNORMAL LOW (ref >60)
Glucose, Bld: 92 mg/dL (ref 65–99)
Potassium: 4 mmol/L (ref 3.5–5.1)
Sodium: 141 mmol/L (ref 135–145)

## 2016-11-21 LAB — CBC
HCT: 37.8 % (ref 36.0–46.0)
Hemoglobin: 12.4 g/dL (ref 12.0–15.0)
MCH: 31 pg (ref 26.0–34.0)
MCHC: 32.8 g/dL (ref 30.0–36.0)
MCV: 94.5 fL (ref 78.0–100.0)
Platelets: 267 10*3/uL (ref 150–400)
RBC: 4 MIL/uL (ref 3.87–5.11)
RDW: 14.9 % (ref 11.5–15.5)
WBC: 6.4 10*3/uL (ref 4.0–10.5)

## 2017-01-10 ENCOUNTER — Non-Acute Institutional Stay (SKILLED_NURSING_FACILITY): Payer: Medicare Other | Admitting: Internal Medicine

## 2017-01-10 ENCOUNTER — Other Ambulatory Visit: Payer: Self-pay

## 2017-01-10 ENCOUNTER — Encounter: Payer: Self-pay | Admitting: Internal Medicine

## 2017-01-10 DIAGNOSIS — I10 Essential (primary) hypertension: Secondary | ICD-10-CM

## 2017-01-10 DIAGNOSIS — F0390 Unspecified dementia without behavioral disturbance: Secondary | ICD-10-CM

## 2017-01-10 NOTE — Progress Notes (Signed)
Location:    [facility] Room Number: 141/D Place of Service:  SNF (31) Provider:  Clydene Fake, MD  Patient Care Team: Virgie Dad, MD as PCP - General (Internal Medicine)  Extended Emergency Contact Information Primary Emergency Contact: [name], [place] [address] [place] Phone: [phone] Relation: Friend Secondary Emergency Contact: Wojtaszek,[name]  [place] Phone: [phone] Relation: Daughter  Code Status:  Full Code Goals of care: Advanced Directive information Advanced Directives 01/10/2017  Does Patient Have a Medical Advance Directive? Yes  Type of Advance Directive (No Data)  Does patient want to make changes to medical advance directive? No - Patient declined  Copy of [place] in Chart? -     Chief Complaint  Patient presents with  . Medical Management of Chronic Issues    Routine Visit    HPI:  Pt is a 83 y.o. female seen today for medical management of chronic diseases.    Pateint has history of Dementia and has been LTC resident in the facility. She also has h/o Breast cancer s/p Modified Mastectomy, S/P right hemi colectomy and past history of C2 fracture. She has been stable in facility. She walks with the walker. Denies any new complains. Her weight has been stable before but has been down to 152.6 from her usual weight of 156 lbs. Patient denies any problems and says her appetite is good.   Past Medical History:  Diagnosis Date  . Breast cancer ([place])   . Cognitive communication deficit   . Dementia   . Difficulty in walking(719.7)   . Muscle weakness (generalized)   . Urinary tract infection, site not specified    Past Surgical History:  Procedure Laterality Date  . COLON SURGERY    . MASTECTOMY, PARTIAL Right     No Known Allergies  Current Outpatient Prescriptions on File Prior to Visit  Medication Sig  Dispense Refill  . AMBULATORY NON FORMULARY MEDICATION Magic Cup 19ml by mouth at bedtime to increase nutrition    . calcium carbonate (TUMS) 500 MG chewable tablet Chew 1 tablet by mouth daily.    . Cholecalciferol 1000 units tablet Take 1,000 Units by mouth daily.    Marland Kitchen donepezil (ARICEPT) 5 MG tablet Take 10 mg by mouth at bedtime. For Dementia  disease    . vitamin B-12 (CYANOCOBALAMIN) 1000 MCG tablet Take 1,000 mcg by mouth at bedtime.     No current facility-administered medications on file prior to visit.      Review of Systems Not very reliable due to her Mild dementia Review of Systems  Constitutional: Negative for activity change, appetite change, chills, diaphoresis, fatigue and fever.  HENT: Negative for mouth sores, postnasal drip, rhinorrhea, sinus pain and sore throat.   Respiratory: Negative for apnea, cough, chest tightness, shortness of breath and wheezing.   Cardiovascular: Negative for chest pain, palpitations and leg swelling.  Gastrointestinal: Negative for abdominal distention, abdominal pain, constipation, diarrhea, nausea and vomiting.  Genitourinary: Negative for dysuria and frequency.  Musculoskeletal: Negative for arthralgias, joint swelling and myalgias.  Skin: Negative for rash.  Neurological: Negative for dizziness, syncope, weakness, light-headedness and numbness.  Psychiatric/Behavioral: Negative for behavioral problems, confusion and sleep disturbance.     Immunization History  Administered Date(s) Administered  . Influenza-Unspecified 07/28/2014, 07/20/2016  . Pneumococcal-Unspecified 08/01/2016   Pertinent  Health Maintenance Due  Topic Date Due  .  DEXA SCAN  04/18/2017 (Originally 08/25/1992)  . PNA vac Low Risk Adult (2 of 2 - PCV13) 08/01/2017  . INFLUENZA VACCINE  Completed   No flowsheet data found. Functional Status Survey:    There were no vitals filed for this visit. There is no height or weight on file to calculate BMI. Physical  Exam  Constitutional: She appears well-developed and well-nourished.  HENT:  Head: Normocephalic.  Eyes: Pupils are equal, round, and reactive to light.  Neck: Neck supple.  Cardiovascular: Normal rate, regular rhythm and normal heart sounds.   Pulmonary/Chest: Effort normal and breath sounds normal. No respiratory distress. She has no wheezes.  Abdominal: Soft. Bowel sounds are normal. She exhibits no distension. There is no tenderness. There is no rebound.  Musculoskeletal: She exhibits no edema.  Neurological: She is alert.  Psychiatric: She has a normal mood and affect. Her behavior is normal. Thought content normal.    Labs reviewed:  Recent Labs  05/02/16 0740 07/27/16 0715 10/05/16 0700 11/21/16 0700  NA 140 139  --  141  K 4.3 4.0  --  4.0  CL 106 109  --  106  CO2 27 26  --  29  GLUCOSE 113* 92  --  92  BUN 16 16  --  12  CREATININE 0.94 0.93  --  0.96  CALCIUM 8.7* 8.6* 8.7* 8.7*    Recent Labs  02/09/16 0750 07/27/16 0715  AST 19 16  ALT 15 11*  ALKPHOS 67 62  BILITOT 0.6 0.6  PROT 7.2 6.6  ALBUMIN 3.4* 3.2*    Recent Labs  02/09/16 0750 05/02/16 0740 07/27/16 0715 11/21/16 0700  WBC 7.1 6.6 6.6 6.4  NEUTROABS 4.2 3.9 3.6  --   HGB 13.9 13.1 12.6 12.4  HCT 42.6 40.8 39.2 37.8  MCV 94.7 94.9 93.8 94.5  PLT 269 241 238 267   Lab Results  Component Value Date   TSH 4.107 03/16/2016   No results found for: HGBA1C No results found for: CHOL, HDL, LDLCALC, LDLDIRECT, TRIG, CHOLHDL  Significant Diagnostic Results in last 30 days:  No results found.  Assessment/Plan  Dementia Continue Aricept. Patient is stable and doing well. Mood is good. Continue supportive care. Hypertension Controlled with no meds.   Family/ staff Communication:   Labs/tests ordered:

## 2017-01-26 ENCOUNTER — Encounter (HOSPITAL_COMMUNITY): Payer: Self-pay | Admitting: Emergency Medicine

## 2017-01-26 ENCOUNTER — Emergency Department (HOSPITAL_COMMUNITY): Payer: Medicare Other

## 2017-01-26 ENCOUNTER — Other Ambulatory Visit (HOSPITAL_COMMUNITY)
Admission: RE | Admit: 2017-01-26 | Discharge: 2017-01-26 | Disposition: A | Discharge status: Home / Self Care | Payer: Medicare Other | Source: Skilled Nursing Facility | Attending: Internal Medicine | Admitting: Internal Medicine

## 2017-01-26 ENCOUNTER — Inpatient Hospital Stay (HOSPITAL_COMMUNITY)
Admission: EM | Admit: 2017-01-26 | Discharge: 2017-01-30 | DRG: 445 | Disposition: A | Discharge status: Skilled Nursing Facility | Payer: Medicare Other | Attending: Internal Medicine | Admitting: Internal Medicine

## 2017-01-26 ENCOUNTER — Other Ambulatory Visit: Payer: Self-pay

## 2017-01-26 DIAGNOSIS — Z79899 Other long term (current) drug therapy: Secondary | ICD-10-CM | POA: Diagnosis not present

## 2017-01-26 DIAGNOSIS — R1010 Upper abdominal pain, unspecified: Secondary | ICD-10-CM | POA: Diagnosis not present

## 2017-01-26 DIAGNOSIS — R52 Pain, unspecified: Secondary | ICD-10-CM | POA: Diagnosis not present

## 2017-01-26 DIAGNOSIS — Z853 Personal history of malignant neoplasm of breast: Secondary | ICD-10-CM | POA: Diagnosis not present

## 2017-01-26 DIAGNOSIS — B9620 Unspecified Escherichia coli [E. coli] as the cause of diseases classified elsewhere: Secondary | ICD-10-CM | POA: Diagnosis not present

## 2017-01-26 DIAGNOSIS — R109 Unspecified abdominal pain: Secondary | ICD-10-CM | POA: Diagnosis not present

## 2017-01-26 DIAGNOSIS — F0390 Unspecified dementia without behavioral disturbance: Secondary | ICD-10-CM | POA: Diagnosis present

## 2017-01-26 DIAGNOSIS — Z85038 Personal history of other malignant neoplasm of large intestine: Secondary | ICD-10-CM

## 2017-01-26 DIAGNOSIS — K8000 Calculus of gallbladder with acute cholecystitis without obstruction: Principal | ICD-10-CM

## 2017-01-26 DIAGNOSIS — N390 Urinary tract infection, site not specified: Secondary | ICD-10-CM | POA: Diagnosis present

## 2017-01-26 DIAGNOSIS — R1900 Intra-abdominal and pelvic swelling, mass and lump, unspecified site: Secondary | ICD-10-CM | POA: Diagnosis not present

## 2017-01-26 DIAGNOSIS — K8001 Calculus of gallbladder with acute cholecystitis with obstruction: Secondary | ICD-10-CM

## 2017-01-26 DIAGNOSIS — K8012 Calculus of gallbladder with acute and chronic cholecystitis without obstruction: Secondary | ICD-10-CM | POA: Diagnosis present

## 2017-01-26 DIAGNOSIS — R1030 Lower abdominal pain, unspecified: Secondary | ICD-10-CM | POA: Insufficient documentation

## 2017-01-26 DIAGNOSIS — I10 Essential (primary) hypertension: Secondary | ICD-10-CM | POA: Diagnosis not present

## 2017-01-26 DIAGNOSIS — D122 Benign neoplasm of ascending colon: Secondary | ICD-10-CM | POA: Diagnosis not present

## 2017-01-26 DIAGNOSIS — R1011 Right upper quadrant pain: Secondary | ICD-10-CM | POA: Diagnosis not present

## 2017-01-26 HISTORY — DX: Vitamin B deficiency, unspecified: E53.9

## 2017-01-26 HISTORY — DX: Unspecified fall, initial encounter: W19.XXXA

## 2017-01-26 LAB — COMPREHENSIVE METABOLIC PANEL
ALT: 16 U/L (ref 14–54)
AST: 26 U/L (ref 15–41)
Albumin: 3.6 g/dL (ref 3.5–5.0)
Alkaline Phosphatase: 68 U/L (ref 38–126)
Anion gap: 10 (ref 5–15)
BUN: 16 mg/dL (ref 6–20)
CO2: 26 mmol/L (ref 22–32)
Calcium: 9.6 mg/dL (ref 8.9–10.3)
Chloride: 107 mmol/L (ref 101–111)
Creatinine, Ser: 0.9 mg/dL (ref 0.44–1.00)
GFR calc Af Amer: >60 mL/min (ref >60)
GFR calc non Af Amer: 55 mL/min — ABNORMAL LOW (ref >60)
Glucose, Bld: 137 mg/dL — ABNORMAL HIGH (ref 65–99)
Potassium: 4.4 mmol/L (ref 3.5–5.1)
Sodium: 143 mmol/L (ref 135–145)
Total Bilirubin: 0.3 mg/dL (ref 0.3–1.2)
Total Protein: 7.5 g/dL (ref 6.5–8.1)

## 2017-01-26 LAB — CBC
HCT: 38.4 % (ref 36.0–46.0)
Hemoglobin: 12.5 g/dL (ref 12.0–15.0)
MCH: 30.5 pg (ref 26.0–34.0)
MCHC: 32.6 g/dL (ref 30.0–36.0)
MCV: 93.7 fL (ref 78.0–100.0)
Platelets: 240 10*3/uL (ref 150–400)
RBC: 4.1 MIL/uL (ref 3.87–5.11)
RDW: 15.6 % — ABNORMAL HIGH (ref 11.5–15.5)
WBC: 12.4 10*3/uL — ABNORMAL HIGH (ref 4.0–10.5)

## 2017-01-26 LAB — CBC WITH DIFFERENTIAL/PLATELET
Basophils Absolute: 0 10*3/uL (ref 0.0–0.1)
Basophils Relative: 0 %
Eosinophils Absolute: 0 10*3/uL (ref 0.0–0.7)
Eosinophils Relative: 0 %
HCT: 38.7 % (ref 36.0–46.0)
Hemoglobin: 12.5 g/dL (ref 12.0–15.0)
Lymphocytes Relative: 9 %
Lymphs Abs: 1 10*3/uL (ref 0.7–4.0)
MCH: 30.4 pg (ref 26.0–34.0)
MCHC: 32.3 g/dL (ref 30.0–36.0)
MCV: 94.2 fL (ref 78.0–100.0)
Monocytes Absolute: 0.3 10*3/uL (ref 0.1–1.0)
Monocytes Relative: 2 %
Neutro Abs: 10 10*3/uL — ABNORMAL HIGH (ref 1.7–7.7)
Neutrophils Relative %: 89 %
Platelets: 249 10*3/uL (ref 150–400)
RBC: 4.11 MIL/uL (ref 3.87–5.11)
RDW: 15.5 % (ref 11.5–15.5)
WBC: 11.3 10*3/uL — ABNORMAL HIGH (ref 4.0–10.5)

## 2017-01-26 LAB — URINALYSIS, ROUTINE W REFLEX MICROSCOPIC
Bilirubin Urine: NEGATIVE
Glucose, UA: NEGATIVE mg/dL
Ketones, ur: NEGATIVE mg/dL
Nitrite: POSITIVE — AB
Protein, ur: NEGATIVE mg/dL
Specific Gravity, Urine: 1.019 (ref 1.005–1.030)
pH: 5 (ref 5.0–8.0)

## 2017-01-26 LAB — LIPASE, BLOOD: Lipase: 45 U/L (ref 11–51)

## 2017-01-26 MED ORDER — DEXTROSE 5 % IV SOLN
1.0000 g | Freq: Once | INTRAVENOUS | Status: AC
  Administered 2017-01-26: 1 g via INTRAVENOUS
  Filled 2017-01-26: qty 10

## 2017-01-26 MED ORDER — IOPAMIDOL (ISOVUE-300) INJECTION 61%
INTRAVENOUS | Status: AC
  Filled 2017-01-26: qty 30

## 2017-01-26 MED ORDER — SODIUM CHLORIDE 0.9 % IV BOLUS (SEPSIS)
500.0000 mL | Freq: Once | INTRAVENOUS | Status: AC
  Administered 2017-01-26: 500 mL via INTRAVENOUS

## 2017-01-26 MED ORDER — IOPAMIDOL (ISOVUE-300) INJECTION 61%
100.0000 mL | Freq: Once | INTRAVENOUS | Status: AC | PRN
  Administered 2017-01-26: 100 mL via INTRAVENOUS

## 2017-01-26 NOTE — ED Notes (Signed)
Pt returned from CT °

## 2017-01-26 NOTE — ED Provider Notes (Signed)
[facility] DEPT Provider Note   CSN: [id number] Arrival date & time: 01/26/17  1535     History   Chief Complaint Chief Complaint  Patient presents with  . Abdominal Pain    HPI Cynthia [name] is a 83 y.o. female.  Patient complains of abdominal pain. Patient to nursing home has dementia is unclear when the pain started   The history is provided by the patient and the nursing home. No language interpreter was used.  Abdominal Pain   This is a new problem. Episode onset: unknown. The problem occurs constantly. The problem has not changed since onset.The pain is associated with an unknown factor. The pain is located in the RUQ. The quality of the pain is aching. The pain is at a severity of 6/10. The pain is moderate. Pertinent negatives include anorexia, diarrhea, frequency, hematuria and headaches. Nothing aggravates the symptoms. Nothing relieves the symptoms.    Past Medical History:  Diagnosis Date  . Breast cancer ([facility])   . Cognitive communication deficit   . Dementia   . Difficulty in walking(719.7)   . Fall   . Muscle weakness (generalized)   . Urinary tract infection, site not specified   . Vitamin B deficiency     Patient Active Problem List   Diagnosis Date Noted  . HTN (hypertension) 08/26/2016  . Renal insufficiency, mild 03/15/2016  . UTI (urinary tract infection) 12/11/2013  . Breast cancer ([place] [facility] [place]) 12/11/2013  . Benign neoplasm of colon 12/11/2013  . Dementia 12/11/2013  . C2 cervical fracture ([place]) 12/08/2013  . Syncope 12/08/2013    Past Surgical History:  Procedure Laterality Date  . COLON SURGERY    . MASTECTOMY, PARTIAL Right     OB History    No data available       Home Medications    Prior to Admission medications   Medication Sig Start Date End Date Taking? Authorizing Provider  AMBULATORY NON FORMULARY MEDICATION Magic Cup 151ml by mouth at bedtime to increase nutrition    Historical Provider, MD  calcium carbonate (TUMS)  500 MG chewable tablet Chew 1 tablet by mouth daily.    Historical Provider, MD  Cholecalciferol 1000 units tablet Take 1,000 Units by mouth daily.    Historical Provider, MD  donepezil (ARICEPT) 5 MG tablet Take 10 mg by mouth at bedtime. For Dementia  disease    Historical Provider, MD  vitamin B-12 (CYANOCOBALAMIN) 1000 MCG tablet Take 1,000 mcg by mouth at bedtime.    Historical Provider, MD    Family History Family History  Problem Relation Age of Onset  . Cancer Father     ? primary  . Diabetes Neg Hx   . Heart disease Neg Hx   . Stroke Neg Hx     Social History Social History  Substance Use Topics  . Smoking status: Never Smoker  . Smokeless tobacco: Never Used  . Alcohol use No     Allergies   Patient has no known allergies.   Review of Systems Review of Systems  Constitutional: Negative for appetite change and fatigue.  HENT: Negative for congestion, ear discharge and sinus pressure.   Eyes: Negative for discharge.  Respiratory: Negative for cough.   Cardiovascular: Negative for chest pain.  Gastrointestinal: Positive for abdominal pain. Negative for anorexia and diarrhea.  Genitourinary: Negative for frequency and hematuria.  Musculoskeletal: Negative for back pain.  Skin: Negative for rash.  Neurological: Negative for seizures and headaches.  Psychiatric/Behavioral: Negative for hallucinations.  Physical Exam Updated Vital Signs BP 136/63 (BP Location: Left Arm)   Pulse 63   Resp 18   Ht 5\' 5"  (1.651 m)   Wt 160 lb (72.6 kg)   SpO2 96%   BMI 26.63 kg/m   Physical Exam  Constitutional: She is oriented to person, place, and time. She appears well-developed.  HENT:  Head: Normocephalic.  Eyes: Conjunctivae and EOM are normal. No scleral icterus.  Neck: Neck supple. No thyromegaly present.  Cardiovascular: Normal rate and regular rhythm.  Exam reveals no gallop and no friction rub.   No murmur heard. Pulmonary/Chest: No stridor. She has no  wheezes. She has no rales. She exhibits no tenderness.  Abdominal: She exhibits no distension. There is tenderness. There is no rebound.  Tender ruq  Musculoskeletal: Normal range of motion. She exhibits no edema.  Lymphadenopathy:    She has no cervical adenopathy.  Neurological: She is oriented to person, place, and time. She exhibits normal muscle tone. Coordination normal.  Skin: No rash noted. No erythema.  Psychiatric: She has a normal mood and affect. Her behavior is normal.     ED Treatments / Results  Labs (all labs ordered are listed, but only abnormal results are displayed) Labs Reviewed  COMPREHENSIVE METABOLIC PANEL - Abnormal; Notable for the following:       Result Value   Glucose, Bld 137 (*)    GFR calc non Af Amer 55 (*)    All other components within normal limits  CBC - Abnormal; Notable for the following:    WBC 12.4 (*)    RDW 15.6 (*)    All other components within normal limits  URINALYSIS, ROUTINE W REFLEX MICROSCOPIC - Abnormal; Notable for the following:    APPearance HAZY (*)    Hgb urine dipstick SMALL (*)    Nitrite POSITIVE (*)    Leukocytes, UA MODERATE (*)    Bacteria, UA MANY (*)    Squamous Epithelial / LPF 0-5 (*)    All other components within normal limits  URINE CULTURE  LIPASE, BLOOD    EKG  EKG Interpretation None       Radiology US Abdomen Complete  Result Date: 01/26/2017 CLINICAL DATA:  Abdominal pain EXAM: ABDOMEN ULTRASOUND COMPLETE COMPARISON:  CT 01/26/2017 FINDINGS: Gallbladder: Gallstones are noted along the dependent wall with surrounding tumefactive biliary sludge. Intraluminal mass within the gallbladder is believed less likely given lack of vascularity. Gallbladder wall thickening up to 7 mm in thickness with trace pericholecystic fluid. Positive sonographic Murphy's. Common bile duct: Diameter: 6.3 mm.  No choledocholithiasis. Liver: No focal lesion identified. Mildly echogenic consistent fatty infiltration. No  biliary dilatation. IVC: No abnormality visualized. Pancreas: Visualized portion unremarkable. The pancreas is largely obscured by overlying bowel gas however. Spleen: Size and appearance within normal limits. Right Kidney: Length: 7.4 cm. Echogenicity within normal limits. No mass or hydronephrosis visualized. Left Kidney: Length: 9.7 cm. Echogenicity within normal limits. No mass or hydronephrosis visualized. Abdominal aorta: No aneurysm visualized. Other findings: None. IMPRESSION: Cholelithiasis and tumefactive biliary sludge. This in conjunction with gallbladder wall thickening, positive sonographic Murphy's and mild pericholecystic fluid and edema is concerning for changes of acute cholecystitis. Electronically Signed   By: Ashley Royalty M.D.   On: 01/26/2017 21:24   Ct Abdomen Pelvis W Contrast  Result Date: 01/26/2017 CLINICAL DATA:  Abdominal pain. EXAM: CT ABDOMEN AND PELVIS WITH CONTRAST TECHNIQUE: Multidetector CT imaging of the abdomen and pelvis was performed using the standard protocol following  bolus administration of intravenous contrast. CONTRAST:  11mL ISOVUE-300 IOPAMIDOL (ISOVUE-300) INJECTION 61% COMPARISON:  None. FINDINGS: Lower chest: Small hiatal hernia. Hepatobiliary: The liver and biliary tree are within normal. Gallbladder is mildly distended with wall thickening and cholelithiasis. Possible small amount of pericholecystic fluid. Pancreas: Within normal. Spleen: Within normal. Adrenals/Urinary Tract: Adrenal glands are normal. Right kidney is somewhat small as there is mild cortical scarring over the upper and lower pole right kidney. Left kidney within normal. No hydronephrosis or nephrolithiasis. Ureters and bladder are normal. Stomach/Bowel: Small hiatal hernia as the stomach is otherwise within normal. Small bowel is normal. Surgical suture line over the right colon. Appendix not visualized. Extensive diverticulosis throughout the colon most prominent over the sigmoid colon.  Vascular/Lymphatic: Mild calcified plaque over the abdominal aorta and iliac arteries. Vascular structures otherwise within normal. No adenopathy. Reproductive: Within normal.  Pessary present over the midline. Other: Diastases of the rectus abdominus muscles at the level of the umbilicus. Multiple surgical clips over the right abdomen. Musculoskeletal: Degenerative change of the spine and hips. IMPRESSION: Dilatation of the gallbladder with wall thickening and cholelithiasis. Possible small amount of adjacent free fluid. Recommend clinical correlation and possible right upper quadrant ultrasound as findings may be due to acute cholecystitis. Extensive colonic diverticulosis.  Previous partial right colectomy. Somewhat small right kidney with areas of cortical scarring. Small hiatal hernia. Diastases of the rectus abdominus muscles at the level of the umbilicus. Electronically Signed   By: Marin Olp M.D.   On: 01/26/2017 18:21    Procedures Procedures (including critical care time)  Medications Ordered in ED Medications  iopamidol (ISOVUE-300) 61 % injection (not administered)  cefTRIAXone (ROCEPHIN) 1 g in dextrose 5 % 50 mL IVPB (0 g Intravenous Stopped 01/26/17 1800)  iopamidol (ISOVUE-300) 61 % injection 100 mL (100 mLs Intravenous Contrast Given 01/26/17 1748)  sodium chloride 0.9 % bolus 500 mL (500 mLs Intravenous New Bag/Given 01/26/17 2150)  cefTRIAXone (ROCEPHIN) 1 g in dextrose 5 % 50 mL IVPB (1 g Intravenous New Bag/Given 01/26/17 2159)     Initial Impression / Assessment and Plan / ED Course  I have reviewed the triage vital signs and the nursing notes.  Pertinent labs & imaging results that were available during my care of the patient were reviewed by me and considered in my medical decision making (see chart for details).     Patient with cholecystitis. She will be admitted for antibiotics by internal medicine and surgery consult. Gen. surgery will see the patient in the morning and  possibly remove the gallbladder  Final Clinical Impressions(s) / ED Diagnoses   Final diagnoses:  Pain of upper abdomen    New Prescriptions New Prescriptions   No medications on file     Milton Ferguson, MD 01/26/17 2209

## 2017-01-26 NOTE — ED Notes (Signed)
Patient standing at doorway with asking where she was and that she liked her other room better. Wanting to know why they moved her. Reminded patient that she was not at the [facility]. Reminded patient that she was in the ER and was going to be admitted upstair for her stomach pain. Patient assisted back to bed .

## 2017-01-26 NOTE — H&P (Addendum)
TRH H&P    Patient Demographics:    Cynthia [name], is a 83 y.o. female  MRN: [id number]  DOB - [birth year]  Admit Date - 01/26/2017  Referring MD/NP/PA: Dr. Roderic Palau  Outpatient Primary MD for the patient is [place]  Patient coming from: [facility] center  Chief Complaint  Patient presents with  . Abdominal Pain      HPI:    Cynthia [name]  is a 83 y.o. female, With history of breast cancer status post mastectomy, benign tumor in: Status post colon surgery, dementia who currently resides at skilled nursing facility. Patient is a poor historian due to underlying dementia. She was brought to ED with complaints of abdominal pain. Patient is unable to provide any details regarding abdominal pain. There is no reported nausea vomiting or diarrhea.   In ED, CT scan of the abdomen showed dilatation of the gallbladder with wall thickening and cholelithiasis. Abdominal ultrasound was done, which showed cholelithiasis, gallbladder wall thickening positive sonographic Murphy sign mild pericholecystic fluid and edema concerning for changes of acute cholecystitis.  General surgery was consulted by ED physician, and general surgeon will see the patient in a.m.   Review of systems:     Review of systems as per history of present illness, rest of the review of systems unobtainable due to patient's underlying dementia   With Past History of the following :    Past Medical History:  Diagnosis Date  . Breast cancer ([place])   . Cognitive communication deficit   . Dementia   . Difficulty in walking(719.7)   . Fall   . Muscle weakness (generalized)   . Urinary tract infection, site not specified   . Vitamin B deficiency       Past Surgical History:  Procedure Laterality Date  . COLON SURGERY    . MASTECTOMY, PARTIAL Right       Social History:      Social History  Substance Use Topics  .  Smoking status: Never Smoker  . Smokeless tobacco: Never Used  . Alcohol use No       Family History :     Family History  Problem Relation Age of Onset  . Cancer Father     ? primary  . Diabetes Neg Hx   . Heart disease Neg Hx   . Stroke Neg Hx       Home Medications:   Prior to Admission medications   Medication Sig Start Date End Date Taking? Authorizing Provider  AMBULATORY NON FORMULARY MEDICATION Magic Cup 165ml by mouth at bedtime to increase nutrition    Historical Provider, MD  calcium carbonate (TUMS) 500 MG chewable tablet Chew 1 tablet by mouth daily.    Historical Provider, MD  Cholecalciferol 1000 units tablet Take 1,000 Units by mouth daily.    Historical Provider, MD  donepezil (ARICEPT) 5 MG tablet Take 10 mg by mouth at bedtime. For Dementia  disease    Historical Provider, MD  vitamin B-12 (CYANOCOBALAMIN) 1000 MCG tablet Take 1,000 mcg by mouth at  bedtime.    Historical Provider, MD     Allergies:    No Known Allergies   Physical Exam:   Vitals  Blood pressure 136/63, pulse 63, resp. rate 18, height 5\' 5"  (1.651 m), weight 72.6 kg (160 lb), SpO2 96 %.  1.  General: Appears in no acute distress  2. Psychiatric:  Lacks t judgement and  insight, awake alert, oriented x 2.  3. Neurologic: No focal neurological deficits, all cranial nerves intact.Strength 5/5 all 4 extremities, sensation intact all 4 extremities, plantars down going.  4. Eyes :  anicteric sclerae, moist conjunctivae with no lid lag. PERRLA.  5. ENMT:  Oropharynx clear with moist mucous membranes and good dentition  6. Neck:  supple, no cervical lymphadenopathy appriciated, No thyromegaly  7. Respiratory : Normal respiratory effort, good air movement bilaterally,clear to  auscultation bilaterally  8. Cardiovascular : RRR, no gallops, rubs or murmurs, no leg edema  9. Gastrointestinal:  Positive bowel sounds, abdomen soft, tenderness to palpation in the right upper  quadrant, no hepatosplenomegaly, no rigidity or guarding       10. Skin:  No cyanosis, normal texture and turgor, no rash, lesions or ulcers  11.Musculoskeletal:  Good muscle tone,  joints appear normal , no effusions,  normal range of motion    Data Review:    CBC  Recent Labs Lab 01/26/17 1015 01/26/17 1553  WBC 11.3* 12.4*  HGB 12.5 12.5  HCT 38.7 38.4  PLT 249 240  MCV 94.2 93.7  MCH 30.4 30.5  MCHC 32.3 32.6  RDW 15.5 15.6*  LYMPHSABS 1.0  --   MONOABS 0.3  --   EOSABS 0.0  --   BASOSABS 0.0  --    ------------------------------------------------------------------------------------------------------------------  Chemistries   Recent Labs Lab 01/26/17 1553  NA 143  K 4.4  CL 107  CO2 26  GLUCOSE 137*  BUN 16  CREATININE 0.90  CALCIUM 9.6  AST 26  ALT 16  ALKPHOS 68  BILITOT 0.3   ------------------------------------------------------------------------------------------------------------------  ------------------------------------------------------------------------------------------------------------------ Liver Function Tests:  Recent Labs Lab 01/26/17 1553  AST 26  ALT 16  ALKPHOS 68  BILITOT 0.3  PROT 7.5  ALBUMIN 3.6    Recent Labs Lab 01/26/17 1553  LIPASE 45    --------------------------------------------------------------------------------------------------------------- Urine analysis:    Component Value Date/Time   COLORURINE YELLOW 01/26/2017 1615   APPEARANCEUR HAZY (A) 01/26/2017 1615   LABSPEC 1.019 01/26/2017 1615   PHURINE 5.0 01/26/2017 1615   GLUCOSEU NEGATIVE 01/26/2017 1615   HGBUR SMALL (A) 01/26/2017 1615   BILIRUBINUR NEGATIVE 01/26/2017 1615   KETONESUR NEGATIVE 01/26/2017 1615   PROTEINUR NEGATIVE 01/26/2017 1615   UROBILINOGEN 0.2 12/08/2013 1848   NITRITE POSITIVE (A) 01/26/2017 1615   LEUKOCYTESUR MODERATE (A) 01/26/2017 1615      Imaging Results:    US Abdomen Complete  Result Date:  01/26/2017 CLINICAL DATA:  Abdominal pain EXAM: ABDOMEN ULTRASOUND COMPLETE COMPARISON:  CT 01/26/2017 FINDINGS: Gallbladder: Gallstones are noted along the dependent wall with surrounding tumefactive biliary sludge. Intraluminal mass within the gallbladder is believed less likely given lack of vascularity. Gallbladder wall thickening up to 7 mm in thickness with trace pericholecystic fluid. Positive sonographic Murphy's. Common bile duct: Diameter: 6.3 mm.  No choledocholithiasis. Liver: No focal lesion identified. Mildly echogenic consistent fatty infiltration. No biliary dilatation. IVC: No abnormality visualized. Pancreas: Visualized portion unremarkable. The pancreas is largely obscured by overlying bowel gas however. Spleen: Size and appearance within normal limits. Right Kidney: Length: 7.4 cm.  Echogenicity within normal limits. No mass or hydronephrosis visualized. Left Kidney: Length: 9.7 cm. Echogenicity within normal limits. No mass or hydronephrosis visualized. Abdominal aorta: No aneurysm visualized. Other findings: None. IMPRESSION: Cholelithiasis and tumefactive biliary sludge. This in conjunction with gallbladder wall thickening, positive sonographic Murphy's and mild pericholecystic fluid and edema is concerning for changes of acute cholecystitis. Electronically Signed   By: Ashley Royalty M.D.   On: 01/26/2017 21:24   Ct Abdomen Pelvis W Contrast  Result Date: 01/26/2017 CLINICAL DATA:  Abdominal pain. EXAM: CT ABDOMEN AND PELVIS WITH CONTRAST TECHNIQUE: Multidetector CT imaging of the abdomen and pelvis was performed using the standard protocol following bolus administration of intravenous contrast. CONTRAST:  184mL ISOVUE-300 IOPAMIDOL (ISOVUE-300) INJECTION 61% COMPARISON:   IMPRESSION: Dilatation of the gallbladder with wall thickening and cholelithiasis. Possible small amount of adjacent free fluid. Recommend clinical correlation and possible right upper quadrant ultrasound as findings may be  due to acute cholecystitis. Extensive colonic diverticulosis.  Previous partial right colectomy. Somewhat small right kidney with areas of cortical scarring. Small hiatal hernia. Diastases of the rectus abdominus muscles at the level of the umbilicus. Electronically Signed   By: Marin Olp M.D.   On: 01/26/2017 18:21    My personal review of EKG: Rhythm NSR   Assessment & Plan:    Active Problems:   UTI (urinary tract infection)   Breast cancer (HCC)   Benign neoplasm of colon   HTN (hypertension)   Cholecystitis, acute with cholelithiasis   1. Acute cholecystitis- place under observation, start IV normal saline at 100 ML per hour, he should received ceftriaxone in the ED. Continue ceftriaxone per pharmacy consultation. We'll keep patient nothing by mouth in anticipation for surgery in  morning. 2. Dementia, without behavior disturbance- hold Aricept as  patient is nothing by mouth. Restart Aricept after surgery. 3. UTI- patient is unable to provide any history, but UA is abnormal, with positive nitrite and too numerous to count WBC in the urine. Started on ceftriaxone. Follow urine culture results.  Risk stratification- patient is moderate risk for surgery due to advanced age.  DVT Prophylaxis-   Lovenox   AM Labs Ordered, also please review Full Orders  Family Communication: Admission, patients condition and plan of care including tests being ordered have been discussed with the patient daughter on phone  who indicate understanding and agree with the plan and Code Status.  Code Status:  Full code   Admission status: Observation    Time spent in minutes : 60 minutes    LAMA,GAGAN S M.D on 01/26/2017 at 10:40 PM  Between 7am to 7pm - Pager - [phone]. After 7pm go to [email or web address] - password [facility]  [facility] - Office  [phone]

## 2017-01-26 NOTE — ED Notes (Signed)
Patient transported to Ultrasound 

## 2017-01-26 NOTE — ED Notes (Signed)
This RN called the pt's daughter Carsynn Bethune and updated her on the pt's condition and that she would be having surgery in the morning.  [name] Piscitelli-[phone]

## 2017-01-26 NOTE — ED Triage Notes (Signed)
Pt sent over for evaluation of abd pain. Pt has history of small bowel obstruction per [facility]. Portable abd xray completed today with a result of "abnormal gas pattern". Pt reports darker stools within last several days. Pt denies being on blood thinner, n/v.

## 2017-01-27 ENCOUNTER — Encounter (HOSPITAL_COMMUNITY): Payer: Self-pay | Admitting: Interventional Radiology

## 2017-01-27 ENCOUNTER — Observation Stay (HOSPITAL_COMMUNITY): Payer: Medicare Other

## 2017-01-27 DIAGNOSIS — Z79899 Other long term (current) drug therapy: Secondary | ICD-10-CM | POA: Diagnosis not present

## 2017-01-27 DIAGNOSIS — D122 Benign neoplasm of ascending colon: Secondary | ICD-10-CM | POA: Diagnosis not present

## 2017-01-27 DIAGNOSIS — R109 Unspecified abdominal pain: Secondary | ICD-10-CM | POA: Diagnosis not present

## 2017-01-27 DIAGNOSIS — Z853 Personal history of malignant neoplasm of breast: Secondary | ICD-10-CM | POA: Diagnosis not present

## 2017-01-27 DIAGNOSIS — Z4682 Encounter for fitting and adjustment of non-vascular catheter: Secondary | ICD-10-CM | POA: Diagnosis not present

## 2017-01-27 DIAGNOSIS — B9620 Unspecified Escherichia coli [E. coli] as the cause of diseases classified elsewhere: Secondary | ICD-10-CM | POA: Diagnosis present

## 2017-01-27 DIAGNOSIS — I10 Essential (primary) hypertension: Secondary | ICD-10-CM | POA: Diagnosis not present

## 2017-01-27 DIAGNOSIS — R52 Pain, unspecified: Secondary | ICD-10-CM | POA: Diagnosis not present

## 2017-01-27 DIAGNOSIS — K8010 Calculus of gallbladder with chronic cholecystitis without obstruction: Secondary | ICD-10-CM | POA: Diagnosis not present

## 2017-01-27 DIAGNOSIS — K810 Acute cholecystitis: Secondary | ICD-10-CM | POA: Diagnosis not present

## 2017-01-27 DIAGNOSIS — F0390 Unspecified dementia without behavioral disturbance: Secondary | ICD-10-CM | POA: Diagnosis present

## 2017-01-27 DIAGNOSIS — Z85038 Personal history of other malignant neoplasm of large intestine: Secondary | ICD-10-CM | POA: Diagnosis not present

## 2017-01-27 DIAGNOSIS — N390 Urinary tract infection, site not specified: Secondary | ICD-10-CM | POA: Diagnosis not present

## 2017-01-27 DIAGNOSIS — R1010 Upper abdominal pain, unspecified: Secondary | ICD-10-CM | POA: Diagnosis not present

## 2017-01-27 DIAGNOSIS — K8000 Calculus of gallbladder with acute cholecystitis without obstruction: Secondary | ICD-10-CM | POA: Diagnosis not present

## 2017-01-27 HISTORY — PX: IR PERC CHOLECYSTOSTOMY: IMG2326

## 2017-01-27 LAB — COMPREHENSIVE METABOLIC PANEL
ALT: 14 U/L (ref 14–54)
AST: 21 U/L (ref 15–41)
Albumin: 3 g/dL — ABNORMAL LOW (ref 3.5–5.0)
Alkaline Phosphatase: 54 U/L (ref 38–126)
Anion gap: 7 (ref 5–15)
BUN: 13 mg/dL (ref 6–20)
CO2: 25 mmol/L (ref 22–32)
Calcium: 8.4 mg/dL — ABNORMAL LOW (ref 8.9–10.3)
Chloride: 108 mmol/L (ref 101–111)
Creatinine, Ser: 0.89 mg/dL (ref 0.44–1.00)
GFR calc Af Amer: >60 mL/min (ref >60)
GFR calc non Af Amer: 56 mL/min — ABNORMAL LOW (ref >60)
Glucose, Bld: 122 mg/dL — ABNORMAL HIGH (ref 65–99)
Potassium: 3.8 mmol/L (ref 3.5–5.1)
Sodium: 140 mmol/L (ref 135–145)
Total Bilirubin: 0.5 mg/dL (ref 0.3–1.2)
Total Protein: 6.2 g/dL — ABNORMAL LOW (ref 6.5–8.1)

## 2017-01-27 LAB — CBC
HCT: 35.7 % — ABNORMAL LOW (ref 36.0–46.0)
HCT: 42.6 % (ref 36.0–46.0)
Hemoglobin: 11.9 g/dL — ABNORMAL LOW (ref 12.0–15.0)
Hemoglobin: 14 g/dL (ref 12.0–15.0)
MCH: 30.9 pg (ref 26.0–34.0)
MCH: 30.8 pg (ref 26.0–34.0)
MCHC: 33.3 g/dL (ref 30.0–36.0)
MCHC: 32.9 g/dL (ref 30.0–36.0)
MCV: 92.7 fL (ref 78.0–100.0)
MCV: 93.8 fL (ref 78.0–100.0)
Platelets: 224 10*3/uL (ref 150–400)
Platelets: 244 10*3/uL (ref 150–400)
RBC: 3.85 MIL/uL — ABNORMAL LOW (ref 3.87–5.11)
RBC: 4.54 MIL/uL (ref 3.87–5.11)
RDW: 15.9 % — ABNORMAL HIGH (ref 11.5–15.5)
RDW: 16.3 % — ABNORMAL HIGH (ref 11.5–15.5)
WBC: 15.6 10*3/uL — ABNORMAL HIGH (ref 4.0–10.5)
WBC: 16.1 10*3/uL — ABNORMAL HIGH (ref 4.0–10.5)

## 2017-01-27 LAB — TYPE AND SCREEN
ABO/RH(D): O POS
Antibody Screen: NEGATIVE

## 2017-01-27 LAB — PROTIME-INR
INR: 1.1
Prothrombin Time: 14.2 seconds (ref 11.4–15.2)

## 2017-01-27 MED ORDER — ACETAMINOPHEN 325 MG PO TABS: 650.0000 mg | ORAL_TABLET | Freq: Four times a day (QID) | ORAL | Status: DC | PRN

## 2017-01-27 MED ORDER — IOPAMIDOL (ISOVUE-300) INJECTION 61%: 50.0000 mL | Freq: Once | INTRAVENOUS | Status: DC | PRN

## 2017-01-27 MED ORDER — IOPAMIDOL (ISOVUE-300) INJECTION 61%
INTRAVENOUS | Status: AC
  Filled 2017-01-27: qty 50

## 2017-01-27 MED ORDER — CEFAZOLIN SODIUM-DEXTROSE 2-4 GM/100ML-% IV SOLN
INTRAVENOUS | Status: AC
  Filled 2017-01-27: qty 100

## 2017-01-27 MED ORDER — ENOXAPARIN SODIUM 40 MG/0.4ML ~~LOC~~ SOLN
40.0000 mg | SUBCUTANEOUS | Status: DC
  Administered 2017-01-27 – 2017-01-30 (×4): 40 mg via SUBCUTANEOUS
  Filled 2017-01-27 (×4): qty 0.4

## 2017-01-27 MED ORDER — FENTANYL CITRATE (PF) 100 MCG/2ML IJ SOLN
INTRAMUSCULAR | Status: AC | PRN
  Administered 2017-01-27 (×2): 25 ug via INTRAVENOUS

## 2017-01-27 MED ORDER — SODIUM CHLORIDE 0.9 % IV SOLN
INTRAVENOUS | Status: DC
  Administered 2017-01-27: 07:00:00 via INTRAVENOUS

## 2017-01-27 MED ORDER — DEXTROSE 5 % IV SOLN
2.0000 g | INTRAVENOUS | Status: DC
  Filled 2017-01-27: qty 2

## 2017-01-27 MED ORDER — AMPICILLIN-SULBACTAM SODIUM 3 (2-1) G IJ SOLR
3.0000 g | Freq: Three times a day (TID) | INTRAMUSCULAR | Status: DC
  Administered 2017-01-27 – 2017-01-28 (×4): 3 g via INTRAVENOUS
  Filled 2017-01-27 (×8): qty 3

## 2017-01-27 MED ORDER — CEFAZOLIN SODIUM-DEXTROSE 2-4 GM/100ML-% IV SOLN
2.0000 g | Freq: Once | INTRAVENOUS | Status: AC
  Administered 2017-01-27: 2 g via INTRAVENOUS

## 2017-01-27 MED ORDER — MORPHINE SULFATE (PF) 2 MG/ML IV SOLN
2.0000 mg | INTRAVENOUS | Status: DC | PRN
  Administered 2017-01-27: 2 mg via INTRAVENOUS
  Filled 2017-01-27 (×2): qty 1

## 2017-01-27 MED ORDER — MIDAZOLAM HCL 2 MG/2ML IJ SOLN
INTRAMUSCULAR | Status: AC | PRN
  Administered 2017-01-27: 1 mg via INTRAVENOUS

## 2017-01-27 MED ORDER — SODIUM CHLORIDE 0.9 % IV SOLN
INTRAVENOUS | Status: AC
  Administered 2017-01-28: 05:00:00 via INTRAVENOUS
  Administered 2017-01-29: 1000 mL via INTRAVENOUS

## 2017-01-27 MED ORDER — LIDOCAINE HCL 1 % IJ SOLN
INTRAMUSCULAR | Status: AC
  Filled 2017-01-27: qty 20

## 2017-01-27 MED ORDER — SODIUM CHLORIDE 0.9 % IV SOLN
INTRAVENOUS | Status: DC
  Administered 2017-01-27: 01:00:00 via INTRAVENOUS

## 2017-01-27 MED ORDER — ACETAMINOPHEN 650 MG RE SUPP: 650.0000 mg | Freq: Four times a day (QID) | RECTAL | Status: DC | PRN

## 2017-01-27 MED ORDER — ONDANSETRON HCL 4 MG/2ML IJ SOLN: 4.0000 mg | Freq: Four times a day (QID) | INTRAMUSCULAR | Status: DC | PRN

## 2017-01-27 MED ORDER — FENTANYL CITRATE (PF) 100 MCG/2ML IJ SOLN
INTRAMUSCULAR | Status: AC
  Filled 2017-01-27: qty 2

## 2017-01-27 MED ORDER — MIDAZOLAM HCL 2 MG/2ML IJ SOLN
INTRAMUSCULAR | Status: AC
  Filled 2017-01-27: qty 2

## 2017-01-27 MED ORDER — ONDANSETRON HCL 4 MG PO TABS: 4.0000 mg | ORAL_TABLET | Freq: Four times a day (QID) | ORAL | Status: DC | PRN

## 2017-01-27 NOTE — Progress Notes (Addendum)
Pharmacy Antibiotic Note  Cynthia [name] is a 83 y.o. female admitted on 01/26/2017 with intra-abdominal infection.  Pharmacy has been consulted for unasyn dosing.  Plan: Unasyn 3gm IV q8h F/U cxs and clinical progress  Height: 5\' 5"  (165.1 cm) Weight: 151 lb 3.2 oz (68.6 kg) IBW/kg (Calculated) : 57  Temp (24hrs), Avg:98.4 F (36.9 C), Min:97.7 F (36.5 C), Max:99 F (37.2 C)   Recent Labs Lab 01/26/17 1015 01/26/17 1553 01/27/17 0604  WBC 11.3* 12.4* 15.6*  CREATININE  --  0.90 0.89    Estimated Creatinine Clearance: 41.7 mL/min (by C-G formula based on SCr of 0.89 mg/dL).    No Known Allergies  Antimicrobials this admission: Ceftriaxone 4/7 >> 4/8 Unasyn 4/8  Dose adjustments this admission: N/A  Microbiology results: 4/7 UCx: pending  Thank you for allowing pharmacy to be a part of this patient's care.  Isac Sarna, BS Pharm D, [place] Clinical Pharmacist Pager [phone] 01/27/2017 8:18 AM

## 2017-01-27 NOTE — Progress Notes (Signed)
Patient transferring to [facility].  Report called and given to Amy, RN.  Awaiting arrival of [facility] for transport. IV in L AC WNL.

## 2017-01-27 NOTE — Progress Notes (Addendum)
PROGRESS NOTE                                                                                                                                                                                                             Patient Demographics:    Cynthia [name], is a 83 y.o. female, DOB - [birth year], TKW:[id number]  Admit date - 01/26/2017   Admitting Physician Oswald Hillock, MD  Outpatient Primary MD for the patient is [place]  LOS - 0  Chief Complaint  Patient presents with  . Abdominal Pain       Brief Narrative   Cynthia [name]  is a 83 y.o. female, Who is a nursing home resident with history of breast cancer status post mastectomy, benign tumor in: Status post colon surgery, dementia who currently resides at skilled nursing facility. Patient is a poor historian due to underlying dementia, she was brought into the hospital with complaints of abdominal pain workup shows possible mild acute cholecystitis.   Subjective:    Cynthia [name] today has, No headache, No chest pain, No abdominal pain - No Nausea, No new weakness tingling or numbness, No Cough - SOB. But poor historian.   Assessment  & Plan :     1.Possible mild acute cholecystitis. Surprisingly liver enzymes are stable, does have mild leukocytosis, and he complains of no abdominal pain however she is a poor historian due to his dementia. Currently nothing by mouth with IV antibiotics. Discussed the case with Dr. Rosana Hoes to neurosurgeon who will evaluate her son. He is planning to manage her conservatively till now. Case also discussed with patient's daughter. She is moderate to high risk for any surgical procedure due to her advanced age, surprisingly she is relatively free of chronic medical problems.   Addendum - pt ++ tender now RUQ, discussed with Dr Malachi Paradise CCS and Dr Reesa Chew IR - transfer to  [facility] will need GB drain placed.   2. History of breast cancer and colon cancer - In remote past. No acute issues.  3. Advanced dementia -  Supportive care at risk for delirium, minimize narcotics and benzodiazepines.  4.UTI - asymptomatic, on  ABX for #1, follow cultures    Diet : Diet NPO time specified    Family Communication  :  Daughter over the phone  Code Status :  Full  Disposition Plan  :  TBD  Consults  :  CCS  Procedures  :    CT and RUQ [place] - ? Mild cholecystitis   DVT  Prophylaxis  :  Lovenox   Lab Results  Component Value Date   PLT 224 01/27/2017    Inpatient Medications  Scheduled Meds: . enoxaparin (LOVENOX) injection  40 mg Subcutaneous Q24H   Continuous Infusions: . sodium chloride     PRN Meds:.morphine injection, [DISCONTINUED] ondansetron **OR** ondansetron (ZOFRAN) IV  Antibiotics  :    Anti-infectives    Start     Dose/Rate Route Frequency Ordered Stop   01/27/17 1800  cefTRIAXone (ROCEPHIN) 2 g in dextrose 5 % 50 mL IVPB  Status:  Discontinued     2 g 100 mL/hr over 30 Minutes Intravenous Every 24 hours 01/27/17 0813 01/27/17 0906   01/26/17 2200  cefTRIAXone (ROCEPHIN) 1 g in dextrose 5 % 50 mL IVPB     1 g Intravenous  Once 01/26/17 2153 01/26/17 2311   01/26/17 1715  cefTRIAXone (ROCEPHIN) 1 g in dextrose 5 % 50 mL IVPB     1 g Intravenous  Once 01/26/17 1701 01/26/17 1800         Objective:   Vitals:   01/26/17 2314 01/27/17 0000 01/27/17 0014 01/27/17 0508  BP: 140/71  (!) 127/32 (!) 117/57  Pulse: 74  77 80  Resp: 20  18 18   Temp:   97.7 F (36.5 C) 99 F (37.2 C)  TempSrc:   Oral Oral  SpO2: 97%  96% 96%  Weight:  68.6 kg (151 lb 3.2 oz)    Height:  5\' 5"  (1.651 m)      Wt Readings from Last 3 Encounters:  01/27/17 68.6 kg (151 lb 3.2 oz)  11/20/16 71.9 kg (158 lb 9.6 oz)  11/16/16 71.9 kg (158 lb 9.6 oz)     Intake/Output Summary (Last 24 hours) at 01/27/17 0907 Last data filed at 01/27/17 0650  Gross per  24 hour  Intake          1116.66 ml  Output               13 ml  Net          1103.66 ml     Physical Exam  Awake, pleasantly confused, No new F.N deficits, Normal affect [place].AT,PERRAL Supple Neck,No JVD, No cervical lymphadenopathy appriciated.  Symmetrical Chest wall movement, Good air movement bilaterally, CTAB RRR,No Gallops,Rubs or new Murmurs, No Parasternal Heave +ve B.Sounds, Abd Soft, Mild RUQ tenderness, No organomegaly appriciated, No rebound - guarding or rigidity. No Cyanosis, Clubbing or edema, No new Rash or bruise       Data Review:    CBC  Recent Labs Lab 01/26/17 1015 01/26/17 1553 01/27/17 0604  WBC 11.3* 12.4* 15.6*  HGB 12.5 12.5 11.9*  HCT 38.7 38.4 35.7*  PLT 249 240 224  MCV 94.2 93.7 92.7  MCH 30.4 30.5 30.9  MCHC 32.3 32.6 33.3  RDW 15.5 15.6* 15.9*  LYMPHSABS 1.0  --   --   MONOABS 0.3  --   --   EOSABS 0.0  --   --   BASOSABS 0.0  --   --     Chemistries   Recent Labs Lab 01/26/17 1553 01/27/17 0604  NA 143 140  K 4.4 3.8  CL 107 108  CO2 26 25  GLUCOSE 137* 122*  BUN 16 13  CREATININE 0.90 0.89  CALCIUM 9.6 8.4*  AST 26 21  ALT 16 14  ALKPHOS 68 54  BILITOT 0.3 0.5   ------------------------------------------------------------------------------------------------------------------ No results for input(s): CHOL, HDL, LDLCALC, TRIG, CHOLHDL, LDLDIRECT  in the last 72 hours.  No results found for: HGBA1C ------------------------------------------------------------------------------------------------------------------ No results for input(s): TSH, T4TOTAL, T3FREE, THYROIDAB in the last 72 hours.  Invalid input(s): FREET3 ------------------------------------------------------------------------------------------------------------------ No results for input(s): VITAMINB12, FOLATE, FERRITIN, TIBC, IRON, RETICCTPCT in the last 72 hours.  Coagulation profile No results for input(s): INR, PROTIME in the last 168 hours.  No  results for input(s): DDIMER in the last 72 hours.  Cardiac Enzymes No results for input(s): CKMB, TROPONINI, MYOGLOBIN in the last 168 hours.  Invalid input(s): CK ------------------------------------------------------------------------------------------------------------------ No results found for: BNP  Micro Results No results found for this or any previous visit (from the past 240 hour(s)).  Radiology Reports US Abdomen Complete  Result Date: 01/26/2017 CLINICAL DATA:  Abdominal pain EXAM: ABDOMEN ULTRASOUND COMPLETE COMPARISON:  CT 01/26/2017 FINDINGS: Gallbladder: Gallstones are noted along the dependent wall with surrounding tumefactive biliary sludge. Intraluminal mass within the gallbladder is believed less likely given lack of vascularity. Gallbladder wall thickening up to 7 mm in thickness with trace pericholecystic fluid. Positive sonographic Murphy's. Common bile duct: Diameter: 6.3 mm.  No choledocholithiasis. Liver: No focal lesion identified. Mildly echogenic consistent fatty infiltration. No biliary dilatation. IVC: No abnormality visualized. Pancreas: Visualized portion unremarkable. The pancreas is largely obscured by overlying bowel gas however. Spleen: Size and appearance within normal limits. Right Kidney: Length: 7.4 cm. Echogenicity within normal limits. No mass or hydronephrosis visualized. Left Kidney: Length: 9.7 cm. Echogenicity within normal limits. No mass or hydronephrosis visualized. Abdominal aorta: No aneurysm visualized. Other findings: None. IMPRESSION: Cholelithiasis and tumefactive biliary sludge. This in conjunction with gallbladder wall thickening, positive sonographic Murphy's and mild pericholecystic fluid and edema is concerning for changes of acute cholecystitis. Electronically Signed   By: Ashley Royalty M.D.   On: 01/26/2017 21:24   Ct Abdomen Pelvis W Contrast  Result Date: 01/26/2017 CLINICAL DATA:  Abdominal pain. EXAM: CT ABDOMEN AND PELVIS WITH  CONTRAST TECHNIQUE: Multidetector CT imaging of the abdomen and pelvis was performed using the standard protocol following bolus administration of intravenous contrast. CONTRAST:  116mL ISOVUE-300 IOPAMIDOL (ISOVUE-300) INJECTION 61% COMPARISON:  None. FINDINGS: Lower chest: Small hiatal hernia. Hepatobiliary: The liver and biliary tree are within normal. Gallbladder is mildly distended with wall thickening and cholelithiasis. Possible small amount of pericholecystic fluid. Pancreas: Within normal. Spleen: Within normal. Adrenals/Urinary Tract: Adrenal glands are normal. Right kidney is somewhat small as there is mild cortical scarring over the upper and lower pole right kidney. Left kidney within normal. No hydronephrosis or nephrolithiasis. Ureters and bladder are normal. Stomach/Bowel: Small hiatal hernia as the stomach is otherwise within normal. Small bowel is normal. Surgical suture line over the right colon. Appendix not visualized. Extensive diverticulosis throughout the colon most prominent over the sigmoid colon. Vascular/Lymphatic: Mild calcified plaque over the abdominal aorta and iliac arteries. Vascular structures otherwise within normal. No adenopathy. Reproductive: Within normal.  Pessary present over the midline. Other: Diastases of the rectus abdominus muscles at the level of the umbilicus. Multiple surgical clips over the right abdomen. Musculoskeletal: Degenerative change of the spine and hips. IMPRESSION: Dilatation of the gallbladder with wall thickening and cholelithiasis. Possible small amount of adjacent free fluid. Recommend clinical correlation and possible right upper quadrant ultrasound as findings may be due to acute cholecystitis. Extensive colonic diverticulosis.  Previous partial right colectomy. Somewhat small right kidney with areas of cortical scarring. Small hiatal hernia. Diastases of the rectus abdominus muscles at the level of the umbilicus. Electronically Signed   By: Quillian Quince  Derrel Nip M.D.   On: 01/26/2017 18:21    Time Spent in minutes  30   Lala Lund M.D on 01/27/2017 at 9:07 AM  Between 7am to 7pm - Pager - [phone] ( page via [facility], text pages only, please mention full 10 digit call back number).  After 7pm go to [email or web address] - password [facility]  [facility] -  Office  [phone]

## 2017-01-27 NOTE — Plan of Care (Signed)
Problem: Safety: Goal: Ability to remain free from injury will improve Outcome: Progressing Patient pleasantly confused - reminded to use call bell when needing to get OOB for BR.  Patient forgetful and attempts to get up frequently without assist.  Bed alarm on, call bell with in reach.  Non slip socks on and patient room close to nurses station.

## 2017-01-27 NOTE — Discharge Instructions (Addendum)
Flush drain with 5-10 cc of normal saline daily. Empty and record drain output daily and bring it with you to your follow up appointment  Biliary Drainage Catheter Placement, Care After This sheet gives you information about how to care for yourself after your procedure. Your health care provider may also give you more specific instructions. If you have problems or questions, contact your health care provider. What can I expect after the procedure? After the procedure, it is common to have:  Pain or soreness at the catheter insertion site.  Tiredness and sleepiness for several hours.  Some bruising at the catheter insertion site.  Drainage into the collection bag on the outside of your body, if you have an external drainage catheter.  You might see bloody discharge in the bag for the first 1 or 2 days.  Then, the discharge should turn a yellow-green color. Follow these instructions at home: Medicines   Take over-the-counter and prescription medicines for pain, discomfort, or fever only as told by your health care provider.  Do not take aspirin or blood thinners unless your health care provider says that you can. These can make bleeding worse.  Do not drive or use heavy machinery while taking prescription pain medicine. Catheter insertion site care   Clean the catheter insertion site as told by your health care provider.  Do not take baths, swim, or use a hot tub until your health care provider approves.  Take showers only. Before showering, cover the catheter insertion area with a watertight covering to keep the area dry.  Keep the skin around the catheter insertion site dry. If the area gets wet, dry the skin completely.  Check your catheter insertion site every day for signs of infection. Check for:  Redness, swelling, or pain.  Fluid or blood.  Warmth.  Pus or a bad smell. General instructions   Rest for the remainder of the day.  Do not drive, use machinery, or  make legal decisions for 24 hours after your procedure.  Resume your usual diet. Avoid alcoholic beverages for 24 hours after your procedure.  Keep all follow-up visits as told by your health care provider. This is important.  Drink enough fluid to keep your urine clear or pale yellow. Contact a health care provider if:  Your pain gets worse after it had improved, and it is not relieved with pain medicines.  You have any questions about caring for your drainage catheter or collection bag.  You have any of these around your catheter insertion site or coming from it:  Skin breakdown.  Redness, swelling, or pain.  Fluid or blood.  Warmth to the touch.  Pus or a bad smell. Get help right away if:  You have a fever or chills.  Your redness, swelling, or pain at the catheter insertion site gets worse, even though you are cleaning it well.  You have leakage of bile around the drainage catheter.  Your drainage catheter becomes blocked or clogged.  Your drainage catheter comes out. This information is not intended to replace advice given to you by your health care provider. Make sure you discuss any questions you have with your health care provider. Document Released: 05/22/2004 Document Revised: 08/27/2016 Document Reviewed: 08/27/2016 Elsevier Interactive Patient Education  2017 [employer].

## 2017-01-27 NOTE — Procedures (Signed)
Acute calculus cholecystitis  s/p [place] AND FLUORO CHOLECYSTOSTOMY  No comp Stable 65 cc exudative bile aspirated cx sent Full report in PACS

## 2017-01-27 NOTE — Sedation Documentation (Signed)
Patient is resting comfortably. 

## 2017-01-27 NOTE — Consult Note (Addendum)
SURGICAL CONSULTATION NOTE (initial) - cpt: 25852  HISTORY OF PRESENT ILLNESS (HPI):  83 y.o. female with severe demential at baseline presented to [facility] ED after patient complained at her residential nursing facility after she complained yesterday of abdominal pain. It is unclear the duration of her abdominal pain or its association with food, but she this morning denies any abdominal pain at all. She likewise denies burning with urination and N/V, fever/chills, CP, or SOB.  Surgery is consulted by medical physician Dr. Frederich Chick in this context for evaluation and management of cholecystitis.  PAST MEDICAL HISTORY (PMH):  Past Medical History:  Diagnosis Date  . Breast cancer ([place])   . Cognitive communication deficit   . Dementia   . Difficulty in walking(719.7)   . Fall   . Muscle weakness (generalized)   . Urinary tract infection, site not specified   . Vitamin B deficiency      PAST SURGICAL HISTORY ([place]):   Open Right-sided partial colectomy Past Surgical History:  Procedure Laterality Date  . COLON SURGERY    . MASTECTOMY, PARTIAL Right      MEDICATIONS:  Prior to Admission medications   Medication Sig Start Date End Date Taking? Authorizing Provider  AMBULATORY NON FORMULARY MEDICATION Take 120 mLs by mouth at bedtime. Magic Cup 134ml by mouth at bedtime to increase nutrition    Yes Historical Provider, MD  calcium carbonate (TUMS) 500 MG chewable tablet Chew 1 tablet by mouth daily.   Yes Historical Provider, MD  Cholecalciferol 1000 units tablet Take 1,000 Units by mouth at bedtime.    Yes Historical Provider, MD  Cyanocobalamin (VITAMIN B-12) 1000 MCG SUBL Place 1 tablet under the tongue at bedtime.   Yes Historical Provider, MD  donepezil (ARICEPT) 10 MG tablet Take 1 tablet by mouth at bedtime. 01/24/17  Yes Historical Provider, MD     ALLERGIES:  No Known Allergies   SOCIAL HISTORY:  Social History   Social History  . Marital status: Widowed    Spouse name: N/A   . Number of children: N/A  . Years of education: N/A   Occupational History  . Not on file.   Social History Main Topics  . Smoking status: Never Smoker  . Smokeless tobacco: Never Used  . Alcohol use No  . Drug use: No  . Sexual activity: Not on file   Other Topics Concern  . Not on file   Social History Narrative  . No narrative on file    The patient currently resides (home / rehab facility / nursing home): [facility] residential skilled nursing facility  The patient normally is (ambulatory / bedbound): Ambulatory, though unclear whether ambulates any more than across room to bathroom, etc.   FAMILY HISTORY:  Family History  Problem Relation Age of Onset  . Cancer Father     ? primary  . Diabetes Neg Hx   . Heart disease Neg Hx   . Stroke Neg Hx     REVIEW OF SYSTEMS:  Constitutional: denies weight loss, fever, chills, or sweats  Eyes: denies any other vision changes, history of eye injury  ENT: denies sore throat, hearing problems  Respiratory: denies shortness of breath, wheezing  Cardiovascular: denies chest pain, palpitations  Gastrointestinal: denies abdominal pain, N/V, or diarrhea Genitourinary: denies burning with urination or urinary frequency Musculoskeletal: denies any other joint pains or cramps  Skin: denies any other rashes or skin discolorations  Neurological: denies any other headache, dizziness, weakness  Psychiatric: denies any other  depression, anxiety   All other review of systems were negative   VITAL SIGNS:  Temp:  [97.7 F (36.5 C)-99 F (37.2 C)] 99 F (37.2 C) (04/08 0508) Pulse Rate:  [56-80] 80 (04/08 0508) Resp:  [18-20] 18 (04/08 0508) BP: (117-144)/(32-71) 117/57 (04/08 0508) SpO2:  [94 %-97 %] 96 % (04/08 0508) Weight:  [68.6 kg (151 lb 3.2 oz)-72.6 kg (160 lb)] 68.6 kg (151 lb 3.2 oz) (04/08 0000)     Height: 5\' 5"  (165.1 cm) Weight: 68.6 kg (151 lb 3.2 oz) BMI (Calculated): 25.2   INTAKE/OUTPUT:  This shift: No  intake/output data recorded.  Last 2 shifts: @IOLAST2SHIFTS @   PHYSICAL EXAM:  Constitutional:  -- Normal body habitus  -- Awake, alert, and oriented to self, but believes she remains at [facility] and has no recollection of recent events Eyes:  -- Pupils equally round and reactive to light  -- No scleral icterus  Ear, nose, and throat:  -- No jugular venous distension  Pulmonary:  -- No crackles  -- Equal breath sounds bilaterally -- Breathing non-labored at rest Cardiovascular:  -- S1, S2 present  -- No pericardial rubs Gastrointestinal:  -- Abdomen soft, minimal RLQ abdominal tenderness to deep palpation, nondistended, no guarding/rebound  -- No abdominal masses appreciated, pulsatile or otherwise  Musculoskeletal and Integumentary:  -- Wounds or skin discoloration: None appreciated -- Extremities: B/L UE and LE FROM, hands and feet warm  Neurologic:  -- Motor function: intact and symmetric -- Sensation: intact and symmetric  Labs: CBC Latest Ref Rng & Units 01/27/2017 01/26/2017 01/26/2017  WBC 4.0 - 10.5 K/uL 15.6(H) 12.4(H) 11.3(H)  Hemoglobin 12.0 - 15.0 g/dL 11.9(L) 12.5 12.5  Hematocrit 36.0 - 46.0 % 35.7(L) 38.4 38.7  Platelets 150 - 400 K/uL 224 240 249   CMP Latest Ref Rng & Units 01/27/2017 01/26/2017 11/21/2016  Glucose 65 - 99 mg/dL 122(H) 137(H) 92  BUN 6 - 20 mg/dL 13 16 12   Creatinine 0.44 - 1.00 mg/dL 0.89 0.90 0.96  Sodium 135 - 145 mmol/L 140 143 141  Potassium 3.5 - 5.1 mmol/L 3.8 4.4 4.0  Chloride 101 - 111 mmol/L 108 107 106  CO2 22 - 32 mmol/L 25 26 29   Calcium 8.9 - 10.3 mg/dL 8.4(L) 9.6 8.7(L)  Total Protein 6.5 - 8.1 g/dL 6.2(L) 7.5 -  Total Bilirubin 0.3 - 1.2 mg/dL 0.5 0.3 -  Alkaline Phos 38 - 126 U/L 54 68 -  AST 15 - 41 U/L 21 26 -  ALT 14 - 54 U/L 14 16 -    Imaging studies:  Abdominal Ultrasound (01/26/2017) Cholelithiasis and tumefactive biliary sludge. This in conjunction with gallbladder wall thickening, positive sonographic  Murphy's and mild pericholecystic fluid and edema is concerning for changes of acute cholecystitis.  CT Abdomen and Pelvis (01/26/2017) Dilatation of the gallbladder with wall thickening and cholelithiasis. Possible small amount of adjacent free fluid. Recommend clinical correlation and possible right upper quadrant ultrasound as findings may be due to acute cholecystitis.  Extensive colonic diverticulosis.  Previous partial right colectomy. Somewhat small right kidney with areas of cortical scarring. Small  hiatal hernia. Diastases of the rectus abdominus muscles at the  level of the umbilicus.  Assessment/Plan: (ICD-10's: K81.9) 83 y.o. female with no longer symptomatic cholecystitis, complicated by leukocytosis and UTI as well as by pertinent comorbidities including prior open Right partial colectomy, baseline severe dementia, breast cancer, ambulatory dysfunction with falls, and generalized deconditioning, described in H&P as "moderate risk for surgery  due to advanced age" and by current hospitalist as "moderate to high risk" for complication.   - start clear liquids diet   - continue antibiotics for UTI  - check CBC for leukocytosis tomorrow   - no urgent surgical intervention at this time considering resolution of abdominal pain  - considering advanced age, prior open colectomy, uncertain ability to tolerate possibly open cholecystectomy, would favor percutaneous cholecystectomy drain if pain recurs/persists or if leukocytosis fails to resolve despite antibiotics for UTI  - if leukocytosis decreases, pain continues to resolve, and patient tolerates clear liquids, will advise advance to low-/non-fat diet with discharge planning, completion of antibiotics course for UTI, and communicate dietary instructions with SNF  - medical management of comorbidities as per medical team  - DVT prophylaxis  All of the above findings and recommendations were discussed with the patient and her  daughter, and all of patient's and her family's questions were answered to their expressed satisfaction. Patient's daughter expresses agreement with management plan.  Thank you for the opportunity to participate in this patient's care.   -- Marilynne Drivers Rosana Hoes, MD, [place]: [facility] Office: [phone]

## 2017-01-28 ENCOUNTER — Inpatient Hospital Stay (HOSPITAL_COMMUNITY): Payer: Medicare Other

## 2017-01-28 ENCOUNTER — Telehealth: Payer: Self-pay

## 2017-01-28 LAB — CBC
HCT: 35.3 % — ABNORMAL LOW (ref 36.0–46.0)
Hemoglobin: 11.4 g/dL — ABNORMAL LOW (ref 12.0–15.0)
MCH: 30.2 pg (ref 26.0–34.0)
MCHC: 32.3 g/dL (ref 30.0–36.0)
MCV: 93.6 fL (ref 78.0–100.0)
Platelets: 218 10*3/uL (ref 150–400)
RBC: 3.77 MIL/uL — ABNORMAL LOW (ref 3.87–5.11)
RDW: 16.6 % — ABNORMAL HIGH (ref 11.5–15.5)
WBC: 13.9 10*3/uL — ABNORMAL HIGH (ref 4.0–10.5)

## 2017-01-28 LAB — COMPREHENSIVE METABOLIC PANEL
ALT: 14 U/L (ref 14–54)
AST: 25 U/L (ref 15–41)
Albumin: 2.9 g/dL — ABNORMAL LOW (ref 3.5–5.0)
Alkaline Phosphatase: 52 U/L (ref 38–126)
Anion gap: 7 (ref 5–15)
BUN: 10 mg/dL (ref 6–20)
CO2: 24 mmol/L (ref 22–32)
Calcium: 8.2 mg/dL — ABNORMAL LOW (ref 8.9–10.3)
Chloride: 110 mmol/L (ref 101–111)
Creatinine, Ser: 0.88 mg/dL (ref 0.44–1.00)
GFR calc Af Amer: >60 mL/min (ref >60)
GFR calc non Af Amer: 57 mL/min — ABNORMAL LOW (ref >60)
Glucose, Bld: 111 mg/dL — ABNORMAL HIGH (ref 65–99)
Potassium: 3.6 mmol/L (ref 3.5–5.1)
Sodium: 141 mmol/L (ref 135–145)
Total Bilirubin: 0.8 mg/dL (ref 0.3–1.2)
Total Protein: 6.4 g/dL — ABNORMAL LOW (ref 6.5–8.1)

## 2017-01-28 LAB — TYPE AND SCREEN
ABO/RH(D): O POS
Antibody Screen: NEGATIVE

## 2017-01-28 MED ORDER — DEXTROSE 5 % IV SOLN
1.0000 g | INTRAVENOUS | Status: DC
  Administered 2017-01-28 – 2017-01-29 (×2): 1 g via INTRAVENOUS
  Filled 2017-01-28 (×3): qty 10

## 2017-01-28 NOTE — Progress Notes (Signed)
Referring Physician(s): Davis,J  Supervising Physician: Markus Daft  Patient Status:  [facility] - In-pt  Chief Complaint:  cholecystitis  Subjective: Pt doing ok; affect flat; has some gen abd discomfort with palpation; denies N/V/chills; can't remember last BM   Allergies: Patient has no known allergies.  Medications: Prior to Admission medications   Medication Sig Start Date End Date Taking? Authorizing Provider  AMBULATORY NON FORMULARY MEDICATION Take 120 mLs by mouth at bedtime. Magic Cup 177ml by mouth at bedtime to increase nutrition    Yes Historical Provider, MD  calcium carbonate (TUMS) 500 MG chewable tablet Chew 1 tablet by mouth daily.   Yes Historical Provider, MD  Cholecalciferol 1000 units tablet Take 1,000 Units by mouth at bedtime.    Yes Historical Provider, MD  Cyanocobalamin (VITAMIN B-12) 1000 MCG SUBL Place 1 tablet under the tongue at bedtime.   Yes Historical Provider, MD  donepezil (ARICEPT) 10 MG tablet Take 1 tablet by mouth at bedtime. 01/24/17  Yes Historical Provider, MD     Vital Signs: BP (!) 134/49 (BP Location: Left Arm)   Pulse 82   Temp 98 F (36.7 C) (Oral)   Resp 16   Ht 5\' 5"  (1.651 m)   Wt 151 lb 3.2 oz (68.6 kg)   SpO2 94%   BMI 25.16 kg/m   Physical Exam GB drain intact, dressing dry, site mildly tender to palpation, drain flushed easily with 5 cc sterile NS, output 100 cc brown bile  Imaging: US Abdomen Complete  Result Date: 01/26/2017 CLINICAL DATA:  Abdominal pain EXAM: ABDOMEN ULTRASOUND COMPLETE COMPARISON:  CT 01/26/2017 FINDINGS: Gallbladder: Gallstones are noted along the dependent wall with surrounding tumefactive biliary sludge. Intraluminal mass within the gallbladder is believed less likely given lack of vascularity. Gallbladder wall thickening up to 7 mm in thickness with trace pericholecystic fluid. Positive sonographic Murphy's. Common bile duct: Diameter: 6.3 mm.  No choledocholithiasis. Liver: No focal lesion  identified. Mildly echogenic consistent fatty infiltration. No biliary dilatation. IVC: No abnormality visualized. Pancreas: Visualized portion unremarkable. The pancreas is largely obscured by overlying bowel gas however. Spleen: Size and appearance within normal limits. Right Kidney: Length: 7.4 cm. Echogenicity within normal limits. No mass or hydronephrosis visualized. Left Kidney: Length: 9.7 cm. Echogenicity within normal limits. No mass or hydronephrosis visualized. Abdominal aorta: No aneurysm visualized. Other findings: None. IMPRESSION: Cholelithiasis and tumefactive biliary sludge. This in conjunction with gallbladder wall thickening, positive sonographic Murphy's and mild pericholecystic fluid and edema is concerning for changes of acute cholecystitis. Electronically Signed   By: Ashley Royalty M.D.   On: 01/26/2017 21:24   Ct Abdomen Pelvis W Contrast  Result Date: 01/26/2017 CLINICAL DATA:  Abdominal pain. EXAM: CT ABDOMEN AND PELVIS WITH CONTRAST TECHNIQUE: Multidetector CT imaging of the abdomen and pelvis was performed using the standard protocol following bolus administration of intravenous contrast. CONTRAST:  134mL ISOVUE-300 IOPAMIDOL (ISOVUE-300) INJECTION 61% COMPARISON:  None. FINDINGS: Lower chest: Small hiatal hernia. Hepatobiliary: The liver and biliary tree are within normal. Gallbladder is mildly distended with wall thickening and cholelithiasis. Possible small amount of pericholecystic fluid. Pancreas: Within normal. Spleen: Within normal. Adrenals/Urinary Tract: Adrenal glands are normal. Right kidney is somewhat small as there is mild cortical scarring over the upper and lower pole right kidney. Left kidney within normal. No hydronephrosis or nephrolithiasis. Ureters and bladder are normal. Stomach/Bowel: Small hiatal hernia as the stomach is otherwise within normal. Small bowel is normal. Surgical suture line over the right colon. Appendix  not visualized. Extensive diverticulosis  throughout the colon most prominent over the sigmoid colon. Vascular/Lymphatic: Mild calcified plaque over the abdominal aorta and iliac arteries. Vascular structures otherwise within normal. No adenopathy. Reproductive: Within normal.  Pessary present over the midline. Other: Diastases of the rectus abdominus muscles at the level of the umbilicus. Multiple surgical clips over the right abdomen. Musculoskeletal: Degenerative change of the spine and hips. IMPRESSION: Dilatation of the gallbladder with wall thickening and cholelithiasis. Possible small amount of adjacent free fluid. Recommend clinical correlation and possible right upper quadrant ultrasound as findings may be due to acute cholecystitis. Extensive colonic diverticulosis.  Previous partial right colectomy. Somewhat small right kidney with areas of cortical scarring. Small hiatal hernia. Diastases of the rectus abdominus muscles at the level of the umbilicus. Electronically Signed   By: Marin Olp M.D.   On: 01/26/2017 18:21   Ir Perc Cholecystostomy  Result Date: 01/27/2017 INDICATION: Acute calculus cholecystitis EXAM: ULTRASOUND AND FLUOROSCOPIC 10 FRENCH PERCUTANEOUS TRANSHEPATIC CHOLECYSTOSTOMY MEDICATIONS: ANCEF 2 G; The antibiotic was administered within an appropriate time frame prior to the initiation of the procedure. ANESTHESIA/SEDATION: Moderate (conscious) sedation was employed during this procedure. A total of Versed 1.0 mg and Fentanyl 50 mcg was administered intravenously. Moderate Sedation Time: 11 minutes. The patient's level of consciousness and vital signs were monitored continuously by radiology nursing throughout the procedure under my direct supervision. FLUOROSCOPY TIME:  Fluoroscopy Time:  12 seconds (3 mGy). COMPLICATIONS: None immediate. PROCEDURE: Informed written consent was obtained from the PATIENT'S DAUGHTER after a thorough discussion of the procedural risks, benefits and alternatives. All questions were addressed.  Maximal Sterile Barrier Technique was utilized including caps, mask, sterile gowns, sterile gloves, sterile drape, hand hygiene and skin antiseptic. A timeout was performed prior to the initiation of the procedure. Previous imaging reviewed. Preliminary ultrasound performed. Gallbladder was localized in the right mid abdomen. Transhepatic window was marked. Under sterile conditions and local anesthesia, a 21 gauge needle was advanced from a percutaneous transhepatic approach into the distended gallbladder. Needle position confirmed with ultrasound. Images obtained for documentation. Guidewire inserted followed by the Accustick dilator set. Amplatz guidewire exchange performed. Tract dilatation performed to insert a 10 [place] drain. Catheter formed in the gallbladder. Syringe aspiration yielded 65 cc headache GJ tube bile. Sample sent for culture. Catheter secured with a Prolene suture and connected to external gravity drainage bag. Sterile dressing applied. No immediate complication. Patient tolerated the procedure well. IMPRESSION: Ultrasound and fluoroscopic 10 French percutaneous transhepatic cholecystostomy. Electronically Signed   By: Jerilynn Mages.  Shick M.D.   On: 01/27/2017 18:26    Labs:  CBC:  Recent Labs  01/26/17 1553 01/27/17 0604 01/27/17 1515 01/28/17 0454  WBC 12.4* 15.6* 16.1* 13.9*  HGB 12.5 11.9* 14.0 11.4*  HCT 38.4 35.7* 42.6 35.3*  PLT 240 224 244 218    COAGS:  Recent Labs  01/27/17 0948  INR 1.10    BMP:  Recent Labs  11/21/16 0700 01/26/17 1553 01/27/17 0604 01/28/17 0454  NA 141 143 140 141  K 4.0 4.4 3.8 3.6  CL 106 107 108 110  CO2 29 26 25 24   GLUCOSE 92 137* 122* 111*  BUN 12 16 13 10   CALCIUM 8.7* 9.6 8.4* 8.2*  CREATININE 0.96 0.90 0.89 0.88  GFRNONAA 51* 55* 56* 57*  GFRAA 59* >60 >60 >60    LIVER FUNCTION TESTS:  Recent Labs  07/27/16 0715 01/26/17 1553 01/27/17 0604 01/28/17 0454  BILITOT 0.6 0.3 0.5 0.8  AST  16 26 21 25   ALT 11* 16 14  14   ALKPHOS 62 68 54 52  PROT 6.6 7.5 6.2* 6.4*  ALBUMIN 3.2* 3.6 3.0* 2.9*    Assessment and Plan: Pt with hx leukocytosis, acute calculous cholecystitis, poor surg candidate; s/p perc cholecystostomy 4/8; AF; creat, LFT's nl; WBC 13.9(16.1), hgb 11.4, bile cx pend; cont current tx/drain irrigation; drain will need to remain in place for at least 4-6 weeks unless cholecystectomy done in interim   Electronically Signed: D. Rowe Robert 01/28/2017, 11:12 AM   I spent a total of 15 minutes at the the patient's bedside AND on the patient's hospital floor or unit, greater than 50% of which was counseling/coordinating care for perc cholecystostomy    Patient ID: Cynthia [name], female   DOB: 11-08-1924, 83 y.o.   MRN: [id number]

## 2017-01-28 NOTE — Evaluation (Signed)
Physical Therapy Evaluation Patient Details Name: Cynthia [name] MRN: [id number] DOB: 1925/05/22 Today's Date: 01/28/2017   History of Present Illness  83 year old female who  has a past medical history of Breast cancer and Dementiia admiitted with Abd pain 2* cholecystitis - pt s/p drain placement 01/27/17  Clinical Impression  Pt admitted as above and presenting with functional mobility limitations 2* abdominal pain, ambulatory balance deficits and dementia related cognition (pt highly distractible).  Pt would benefit from return to previous SNF setting.    Follow Up Recommendations SNF    Equipment Recommendations  None recommended by PT    Recommendations for Other Services       Precautions / Restrictions Precautions Precautions: Fall Restrictions Weight Bearing Restrictions: No      Mobility  Bed Mobility Overal bed mobility: Needs Assistance Bed Mobility: Supine to Sit     Supine to sit: Min assist     General bed mobility comments: cues for log roll and assist to complete transition to sitting  Transfers Overall transfer level: Needs assistance Equipment used: Rolling walker (2 wheeled) Transfers: Sit to/from Stand Sit to Stand: Min assist         General transfer comment: cues for safe transition position and use of UEs to self assist  Ambulation/Gait Ambulation/Gait assistance: Min assist;Min guard Ambulation Distance (Feet): 450 Feet Assistive device: Rolling walker (2 wheeled) Gait Pattern/deviations: WFL(Within Functional Limits) Gait velocity: mod pace   General Gait Details: cues for position from RW and safety awareness  Stairs            Wheelchair Mobility    Modified Rankin (Stroke Patients Only)       Balance Overall balance assessment: Needs assistance Sitting-balance support: No upper extremity supported;Feet supported Sitting balance-Leahy Scale: Good     Standing balance support: Bilateral upper extremity  supported Standing balance-Leahy Scale: Fair                               Pertinent Vitals/Pain Pain Assessment: Faces Faces Pain Scale: Hurts little more Pain Location: R abdominal area Pain Descriptors / Indicators: Sore Pain Intervention(s): Limited activity within patient's tolerance;Monitored during session    Home Living Family/patient expects to be discharged to:: Skilled nursing facility                      Prior Function Level of Independence: Needs assistance   Gait / Transfers Assistance Needed: Uses RW  ADL's / Homemaking Assistance Needed: From [place]        Extremity/Trunk Assessment   Upper Extremity Assessment Upper Extremity Assessment: Overall WFL for tasks assessed    Lower Extremity Assessment Lower Extremity Assessment: Overall WFL for tasks assessed       Communication   Communication: No difficulties  Cognition Arousal/Alertness: Awake/alert Behavior During Therapy: WFL for tasks assessed/performed Overall Cognitive Status: History of cognitive impairments - at baseline                       Memory: Decreased short-term memory                General Comments      Exercises     Assessment/Plan    PT Assessment Patient needs continued PT services  PT Problem List Decreased activity tolerance;Decreased balance;Decreased mobility;Pain;Decreased knowledge of use of DME  PT Treatment Interventions DME instruction;Gait training;Functional mobility training;Therapeutic activities;Therapeutic exercise;Balance training;Patient/family education    PT Goals (Current goals can be found in the Care Plan section)  Acute Rehab PT Goals Patient Stated Goal: Less pain with movement PT Goal Formulation: With patient Time For Goal Achievement: 12/12/13 Potential to Achieve Goals: Good    Frequency Min 3X/week   Barriers to discharge        Co-evaluation                End of Session Equipment Utilized During Treatment: Gait belt Activity Tolerance: Patient tolerated treatment well Patient left: in chair;with chair alarm set;with call bell/phone within reach Nurse Communication: Mobility status PT Visit Diagnosis: Unsteadiness on feet (R26.81)    Time: 4469-5072 PT Time Calculation (min) (ACUTE ONLY): 27 min   Charges:   PT Evaluation $PT Eval Low Complexity: 1 Procedure PT Treatments $Gait Training: 8-22 mins   PT G Codes:        Pg [phone]  Havana Baldwin 01/28/2017, 11:43 AM

## 2017-01-28 NOTE — Progress Notes (Signed)
PROGRESS NOTE  Cynthia [name] STM:[id number] DOB: [birth year] DOA: 01/26/2017 PCP: [place]   LOS: 1 day   Brief Narrative: 83 year old with history of breast cancer post mastectomy, post colectomy, and advanced dementia who resides at a skilled nursing facility was admitted from the emergency department on 01/26/17 for RUQ abdominal pain workup resulting in acute cholecystitis and managed with cholecystostomy on 4/8 by Dr. Annamaria Boots. This was complicated with a UTI and she was empirically treated with Rocephin.  Assessment & Plan: Active Problems:   Cholecystitis, acute with cholelithiasis   UTI (urinary tract infection)   HTN (hypertension)  S/p percutaneous cholecystostomy drain due to cholecystitis   -Patient was [facility] from [facility] to [facility] and she is status post cholecystostomy by interventional radiology.  Cultures are pending, preliminary reading rare gram-negative rods.  Continue ceftriaxone -Leukocytosis improving -Advance diet as tolerated -Abdominal pain on palpation today, no pain at rest, abdominal films without complicating features  UTI -Ceftriaxone, continue  Hypertension -Stable, currently on the medication, suspect somewhat chronically elevated   DVT prophylaxis: Lovenox Code Status: Full Family Communication: No family at bedside, d/w daughter Cynthia [name] over the phone Disposition Plan: SNF, 1-2 days  Consultants:   IR  Procedures:   Percutaneous cholecystostomy 4/8  Antimicrobials:  IV Rocephin 4/7 >> 4/7, 4/9 >>  IV Unasyn 4/8 >>4/9  Subjective: Patient is pleasantly demented and lying restfully in bed. She has no complaints of pain or discomfort. Denies N/V, fever/chills.  Objective: Vitals:   01/27/17 1845 01/27/17 1924 01/27/17 2030 01/28/17 0435  BP: (!) 165/80 (!) 142/57 (!) 124/56 (!) 134/49  Pulse: 95 92 91 82  Resp: 18 20 16 16   Temp: 98.1 F (36.7 C) 98.6 F (37 C) 98.7 F (37.1 C) 98 F  (36.7 C)  TempSrc:  Oral Oral Oral  SpO2: 99% 90% 91% 94%  Weight:      Height:        Intake/Output Summary (Last 24 hours) at 01/28/17 1301 Last data filed at 01/28/17 1014  Gross per 24 hour  Intake           1402.5 ml  Output              255 ml  Net           1147.5 ml   Filed Weights   01/26/17 1537 01/27/17 0000  Weight: 72.6 kg (160 lb) 68.6 kg (151 lb 3.2 oz)    Examination: Constitutional: NAD Vitals:   01/27/17 1845 01/27/17 1924 01/27/17 2030 01/28/17 0435  BP: (!) 165/80 (!) 142/57 (!) 124/56 (!) 134/49  Pulse: 95 92 91 82  Resp: 18 20 16 16   Temp: 98.1 F (36.7 C) 98.6 F (37 C) 98.7 F (37.1 C) 98 F (36.7 C)  TempSrc:  Oral Oral Oral  SpO2: 99% 90% 91% 94%  Weight:      Height:       Eyes:  lids and conjunctivae normal. No icterus ENMT: Mucous membranes are moist.  Respiratory: clear to auscultation bilaterally, no wheezing, no crackles. Normal respiratory effort. No accessory muscle use.  Cardiovascular: Regular rate and rhythm, no murmurs / rubs / gallops..  Abdomen: Diffuse tenderness to palpation. No distension, guarding. Bowel sounds positive.  Musculoskeletal: no clubbing / cyanosis. No jaundice Skin: no rashes, lesions, ulcers. Psychiatric: Flat affect. Confused due to baseline dementia, but pleasant.  Data Reviewed: I have personally reviewed following labs and imaging studies  CBC:  Recent Labs Lab 01/26/17 1015 01/26/17 1553 01/27/17 0604 01/27/17 1515 01/28/17 0454  WBC 11.3* 12.4* 15.6* 16.1* 13.9*  NEUTROABS 10.0*  --   --   --   --   HGB 12.5 12.5 11.9* 14.0 11.4*  HCT 38.7 38.4 35.7* 42.6 35.3*  MCV 94.2 93.7 92.7 93.8 93.6  PLT 249 240 224 244 419   Basic Metabolic Panel:  Recent Labs Lab 01/26/17 1553 01/27/17 0604 01/28/17 0454  NA 143 140 141  K 4.4 3.8 3.6  CL 107 108 110  CO2 26 25 24   GLUCOSE 137* 122* 111*  BUN 16 13 10   CREATININE 0.90 0.89 0.88  CALCIUM 9.6 8.4* 8.2*   GFR: Estimated Creatinine  Clearance: 42.1 mL/min (by C-G formula based on SCr of 0.88 mg/dL). Liver Function Tests:  Recent Labs Lab 01/26/17 1553 01/27/17 0604 01/28/17 0454  AST 26 21 25   ALT 16 14 14   ALKPHOS 68 54 52  BILITOT 0.3 0.5 0.8  PROT 7.5 6.2* 6.4*  ALBUMIN 3.6 3.0* 2.9*    Recent Labs Lab 01/26/17 1553  LIPASE 45   Coagulation Profile:  Recent Labs Lab 01/27/17 0948  INR 1.10   Urine analysis:    Component Value Date/Time   COLORURINE YELLOW 01/26/2017 1615   APPEARANCEUR HAZY (A) 01/26/2017 1615   LABSPEC 1.019 01/26/2017 1615   PHURINE 5.0 01/26/2017 1615   GLUCOSEU NEGATIVE 01/26/2017 1615   HGBUR SMALL (A) 01/26/2017 1615   BILIRUBINUR NEGATIVE 01/26/2017 1615   KETONESUR NEGATIVE 01/26/2017 1615   PROTEINUR NEGATIVE 01/26/2017 1615   UROBILINOGEN 0.2 12/08/2013 1848   NITRITE POSITIVE (A) 01/26/2017 1615   LEUKOCYTESUR MODERATE (A) 01/26/2017 1615   Sepsis Labs: Invalid input(s): PROCALCITONIN, LACTICIDVEN  Recent Results (from the past 240 hour(s))  Aerobic/Anaerobic Culture (surgical/deep wound)     Status: None (Preliminary result)   Collection Time: 01/27/17  6:30 PM  Result Value Ref Range Status   Specimen Description GALL BLADDER  Final   Special Requests Normal  Final   Gram Stain   Final    ABUNDANT WBC PRESENT, PREDOMINANTLY PMN RARE GRAM NEGATIVE RODS Performed at [facility], [address], [place], [place] [address]    Culture PENDING  Incomplete   Report Status PENDING  Incomplete      Radiology Studies: US Abdomen Complete  Result Date: 01/26/2017 CLINICAL DATA:  Abdominal pain EXAM: ABDOMEN ULTRASOUND COMPLETE COMPARISON:  CT 01/26/2017 FINDINGS: Gallbladder: Gallstones are noted along the dependent wall with surrounding tumefactive biliary sludge. Intraluminal mass within the gallbladder is believed less likely given lack of vascularity. Gallbladder wall thickening up to 7 mm in thickness with trace pericholecystic fluid. Positive  sonographic Murphy's. Common bile duct: Diameter: 6.3 mm.  No choledocholithiasis. Liver: No focal lesion identified. Mildly echogenic consistent fatty infiltration. No biliary dilatation. IVC: No abnormality visualized. Pancreas: Visualized portion unremarkable. The pancreas is largely obscured by overlying bowel gas however. Spleen: Size and appearance within normal limits. Right Kidney: Length: 7.4 cm. Echogenicity within normal limits. No mass or hydronephrosis visualized. Left Kidney: Length: 9.7 cm. Echogenicity within normal limits. No mass or hydronephrosis visualized. Abdominal aorta: No aneurysm visualized. Other findings: None. IMPRESSION: Cholelithiasis and tumefactive biliary sludge. This in conjunction with gallbladder wall thickening, positive sonographic Murphy's and mild pericholecystic fluid and edema is concerning for changes of acute cholecystitis. Electronically Signed   By: Ashley Royalty M.D.   On: 01/26/2017 21:24   Ct Abdomen Pelvis W Contrast  Result Date:  01/26/2017 CLINICAL DATA:  Abdominal pain. EXAM: CT ABDOMEN AND PELVIS WITH CONTRAST TECHNIQUE: Multidetector CT imaging of the abdomen and pelvis was performed using the standard protocol following bolus administration of intravenous contrast. CONTRAST:  11mL ISOVUE-300 IOPAMIDOL (ISOVUE-300) INJECTION 61% COMPARISON:  None. FINDINGS: Lower chest: Small hiatal hernia. Hepatobiliary: The liver and biliary tree are within normal. Gallbladder is mildly distended with wall thickening and cholelithiasis. Possible small amount of pericholecystic fluid. Pancreas: Within normal. Spleen: Within normal. Adrenals/Urinary Tract: Adrenal glands are normal. Right kidney is somewhat small as there is mild cortical scarring over the upper and lower pole right kidney. Left kidney within normal. No hydronephrosis or nephrolithiasis. Ureters and bladder are normal. Stomach/Bowel: Small hiatal hernia as the stomach is otherwise within normal. Small bowel  is normal. Surgical suture line over the right colon. Appendix not visualized. Extensive diverticulosis throughout the colon most prominent over the sigmoid colon. Vascular/Lymphatic: Mild calcified plaque over the abdominal aorta and iliac arteries. Vascular structures otherwise within normal. No adenopathy. Reproductive: Within normal.  Pessary present over the midline. Other: Diastases of the rectus abdominus muscles at the level of the umbilicus. Multiple surgical clips over the right abdomen. Musculoskeletal: Degenerative change of the spine and hips. IMPRESSION: Dilatation of the gallbladder with wall thickening and cholelithiasis. Possible small amount of adjacent free fluid. Recommend clinical correlation and possible right upper quadrant ultrasound as findings may be due to acute cholecystitis. Extensive colonic diverticulosis.  Previous partial right colectomy. Somewhat small right kidney with areas of cortical scarring. Small hiatal hernia. Diastases of the rectus abdominus muscles at the level of the umbilicus. Electronically Signed   By: Marin Olp M.D.   On: 01/26/2017 18:21   Ir Perc Cholecystostomy  Result Date: 01/27/2017 INDICATION: Acute calculus cholecystitis EXAM: ULTRASOUND AND FLUOROSCOPIC 10 FRENCH PERCUTANEOUS TRANSHEPATIC CHOLECYSTOSTOMY MEDICATIONS: ANCEF 2 G; The antibiotic was administered within an appropriate time frame prior to the initiation of the procedure. ANESTHESIA/SEDATION: Moderate (conscious) sedation was employed during this procedure. A total of Versed 1.0 mg and Fentanyl 50 mcg was administered intravenously. Moderate Sedation Time: 11 minutes. The patient's level of consciousness and vital signs were monitored continuously by radiology nursing throughout the procedure under my direct supervision. FLUOROSCOPY TIME:  Fluoroscopy Time:  12 seconds (3 mGy). COMPLICATIONS: None immediate. PROCEDURE: Informed written consent was obtained from the PATIENT'S DAUGHTER after  a thorough discussion of the procedural risks, benefits and alternatives. All questions were addressed. Maximal Sterile Barrier Technique was utilized including caps, mask, sterile gowns, sterile gloves, sterile drape, hand hygiene and skin antiseptic. A timeout was performed prior to the initiation of the procedure. Previous imaging reviewed. Preliminary ultrasound performed. Gallbladder was localized in the right mid abdomen. Transhepatic window was marked. Under sterile conditions and local anesthesia, a 21 gauge needle was advanced from a percutaneous transhepatic approach into the distended gallbladder. Needle position confirmed with ultrasound. Images obtained for documentation. Guidewire inserted followed by the Accustick dilator set. Amplatz guidewire exchange performed. Tract dilatation performed to insert a 10 [place] drain. Catheter formed in the gallbladder. Syringe aspiration yielded 65 cc headache GJ tube bile. Sample sent for culture. Catheter secured with a Prolene suture and connected to external gravity drainage bag. Sterile dressing applied. No immediate complication. Patient tolerated the procedure well. IMPRESSION: Ultrasound and fluoroscopic 10 French percutaneous transhepatic cholecystostomy. Electronically Signed   By: Jerilynn Mages.  Shick M.D.   On: 01/27/2017 18:26   Dg Abd Portable 2v  Result Date: 01/28/2017 CLINICAL DATA:  83 year old female  post percutaneous cholecystotomy 01/27/2017. Right abdominal pain. Subsequent encounter. EXAM: PORTABLE ABDOMEN - 2 VIEW COMPARISON:  01/27/2017. FINDINGS: Right sided percutaneous cholecystostomy tube is in place. No free intraperitoneal air or plain film evidence of bowel obstruction. Surgical clips right abdomen. IMPRESSION: Percutaneous cholecystostomy tube is in place. No free intraperitoneal air or plain film evidence of bowel obstruction. Electronically Signed   By: Genia Del M.D.   On: 01/28/2017 11:29     Scheduled Meds: .  ampicillin-sulbactam (UNASYN) IV  3 g Intravenous Q8H  . enoxaparin (LOVENOX) injection  40 mg Subcutaneous Q24H   Continuous Infusions: . sodium chloride 50 mL/hr at 01/28/17 [place], PA-Student  Attending MD note  Patient was seen, examined,treatment plan was discussed with the PA-S [facility]  I have personally reviewed the clinical findings, lab, imaging studies and management of this patient in detail. I agree with the documentation, as recorded by the PA-S and changes to above note were in bold green  Patient is   On Exam: Gen. exam: Awake, alert, not in any distress Chest: Good air entry bilaterally, no rhonchi or rales CVS: S1-S2 regular, no murmurs Abdomen: Diffuse abdominal tenderness, no guarding, no rebound Neurology: Non-focal Skin: No rash or lesions  Plan  Acute cholecystitis -Patient was transferred from [place] to [place] and she is status post cholecystostomy by interventional radiology.  Cultures are pending, preliminary reading rare gram-negative rods.  Continue ceftriaxone -Leukocytosis improving -Advance diet as tolerated -Abdominal pain on palpation today, no pain at rest, abdominal films without complicating features  UTI -Ceftriaxone, continue, cultures are pending, difficult to get a straight answer from patient whether she has symptoms are not  Hypertension -Stable, currently on the medication, suspect somewhat chronically elevated  History of breast cancer and colon cancer  -In remote past. No acute issues.  Advanced dementia  -Supportive care at risk for delirium, minimize narcotics and benzodiazepines.   Rest as above  Aleyda Gindlesperger M. Cruzita Lederer, MD [facility] [phone]  If 7PM-7AM, please contact night-coverage [email or web address] Password [email or web address] 01/28/2017, 1:01 PM

## 2017-01-28 NOTE — Telephone Encounter (Signed)
This is a patient of [place], who was admitted to [facility] after hospitalization. [facility] F/U is needed. Hospital discharge from [facility] on 01/26/2017.

## 2017-01-29 DIAGNOSIS — I10 Essential (primary) hypertension: Secondary | ICD-10-CM

## 2017-01-29 LAB — COMPREHENSIVE METABOLIC PANEL
ALT: 14 U/L (ref 14–54)
AST: 18 U/L (ref 15–41)
Albumin: 2.6 g/dL — ABNORMAL LOW (ref 3.5–5.0)
Alkaline Phosphatase: 48 U/L (ref 38–126)
Anion gap: 7 (ref 5–15)
BUN: 13 mg/dL (ref 6–20)
CO2: 23 mmol/L (ref 22–32)
Calcium: 8.2 mg/dL — ABNORMAL LOW (ref 8.9–10.3)
Chloride: 115 mmol/L — ABNORMAL HIGH (ref 101–111)
Creatinine, Ser: 0.77 mg/dL (ref 0.44–1.00)
GFR calc Af Amer: >60 mL/min (ref >60)
GFR calc non Af Amer: >60 mL/min (ref >60)
Glucose, Bld: 87 mg/dL (ref 65–99)
Potassium: 3.5 mmol/L (ref 3.5–5.1)
Sodium: 145 mmol/L (ref 135–145)
Total Bilirubin: 0.7 mg/dL (ref 0.3–1.2)
Total Protein: 6.2 g/dL — ABNORMAL LOW (ref 6.5–8.1)

## 2017-01-29 LAB — CBC
HCT: 33.1 % — ABNORMAL LOW (ref 36.0–46.0)
Hemoglobin: 10.5 g/dL — ABNORMAL LOW (ref 12.0–15.0)
MCH: 29.8 pg (ref 26.0–34.0)
MCHC: 31.7 g/dL (ref 30.0–36.0)
MCV: 94 fL (ref 78.0–100.0)
Platelets: 212 10*3/uL (ref 150–400)
RBC: 3.52 MIL/uL — ABNORMAL LOW (ref 3.87–5.11)
RDW: 16.5 % — ABNORMAL HIGH (ref 11.5–15.5)
WBC: 8.2 10*3/uL (ref 4.0–10.5)

## 2017-01-29 LAB — ABO/RH: ABO/RH(D): O POS

## 2017-01-29 NOTE — Clinical Social Work Note (Signed)
Clinical Social Work Assessment  Patient Details  Name: Cynthia [name] MRN: [id number] Date of Birth: 07/06/1925  Date of referral:  01/29/17               Reason for consult:  Facility Placement                Permission sought to share information with:    Permission granted to share information::     Name::        Agency::     Relationship::     Contact Information:     Housing/Transportation Living arrangements for the past 2 months:  [place] ([facility] skilled nursing facility) Source of Information:  Adult Children (daughter catherine) Patient Interpreter Needed:  None Criminal Activity/Legal Involvement Pertinent to Current Situation/Hospitalization:  No - Comment as needed Significant Relationships:  Adult Children (daughter Barnetta [place]) Lives with:  Facility Resident Do you feel safe going back to the place where you live?  Yes Need for family participation in patient care:  Yes (Comment)  Care giving concerns: Patient's daughter has no concerns.    Social Worker assessment / plan:  LCSW reviewed patient chart reflecting an advanced dementia diagnosis.  LCSW called and spoke with patient's daughter to gather history.  Patient's daughter wants patient to go back to [facility] skilled nursing facility and will send paperwork through the hub Employment status:  Retired Insurance underwriter information:  Managed Medicare PT Recommendations:  Not assessed at this time Information / Referral to community resources:     Patient/Family's Response to care:  Patient's daughter reported that patient had been at [facility] for the past three years.  She reported that "the care is good" and she sees "no signs of elder abuse and they like her (patient)."  She wants patient to discharge back to this facility and stated that she would be available via phone or fax for patient support.   Patient/Family's Understanding of and Emotional Response to Diagnosis, Current Treatment, and  Prognosis: Patient has advanced dementia.  Patient's daughter reported that she had spoken to two doctors yesterday and they explained diagnosis and treatment.   She was very satisfied and understood patient's diagnoses.   Emotional Assessment Appearance:  Appears younger than stated age Attitude/Demeanor/Rapport:  Other (pleasant) Affect (typically observed):  Pleasant Orientation:  Oriented to Self Alcohol / Substance use:    Psych involvement (Current and /or in the community):  No (Comment)  Discharge Needs  Concerns to be addressed:    Readmission within the last 30 days:  No Current discharge risk:  None Barriers to Discharge:  No Barriers Identified   Carlean Jews, LCSW 01/29/2017, 1:34 PM

## 2017-01-29 NOTE — Clinical Social Work Note (Signed)
LCSW consulted for SNF.  LCSW reviewed patient chart reflecting patient has a diagnosis of advanced dementia.  LCSW called and left a message for patient's daughter Cynthia [name]) to call back for history and needs.  Dede Query, [place] Worker

## 2017-01-29 NOTE — Progress Notes (Signed)
PROGRESS NOTE  Cynthia [name] ATF:[id number] DOB: 12-Mar-1925 DOA: 01/26/2017 PCP: [place]   LOS: 2 days   Brief Narrative: 83 year old with history of breast cancer post mastectomy, post colectomy, and advanced dementia who resides at a skilled nursing facility was admitted from the emergency department on 01/26/17 for RUQ abdominal pain workup resulting in acute cholecystitis and managed with cholecystostomy on 4/8 by Dr. Annamaria Boots.  She was also found to have a UTI and she is empirically treated with Rocephin.  Assessment & Plan: Active Problems:   UTI (urinary tract infection)   HTN (hypertension)   Cholecystitis, acute with cholelithiasis  Acute cholecystitis -Patient was transferred from [facility] to [place] [facility] and she is status post cholecystostomy by interventional radiology.  Cultures are pending, preliminary reading rare gram-negative rods.  Continue ceftriaxone for now -Leukocytosis improving -Advance diet as tolerated, clears >> full today  UTI -Ceftriaxone, continue, cultures are pending, difficult to get a straight answer from patient whether she has symptoms or not  Hypertension -Stable, currently on the medication, suspect somewhat chronically elevated  History of breast cancer and colon cancer  -In remote past. No acute issues.  Advanced dementia  -Supportive care at risk for delirium, minimize narcotics and benzodiazepines.  DVT prophylaxis: Lovenox Code Status: Full Family Communication: No family at bedside, d/w daughter Barnetta Chapel over the phone Disposition Plan: SNF, 1-2 days once cultures result  Consultants:   IR  Procedures:   Percutaneous cholecystostomy 4/8  Antimicrobials:  IV Rocephin 4/7 >> 4/7, 4/9 >>  IV Unasyn 4/8 >>4/9  Subjective: -Pleasantly demented, no complaints.  Denies any chest pain or shortness of breath.  Denies any abdominal pain  Objective: Vitals:   01/28/17 0435 01/28/17 1407 01/28/17  2242 01/29/17 0615  BP: (!) 134/49 138/63 (!) 136/59 (!) 123/51  Pulse: 82 83 74 66  Resp: 16 16 18 18   Temp: 98 F (36.7 C) 97.7 F (36.5 C) 98.2 F (36.8 C) 97.5 F (36.4 C)  TempSrc: Oral Oral Oral Oral  SpO2: 94% 96% 93% 94%  Weight:      Height:        Intake/Output Summary (Last 24 hours) at 01/29/17 1100 Last data filed at 01/29/17 0600  Gross per 24 hour  Intake             1060 ml  Output              240 ml  Net              820 ml   Filed Weights   01/26/17 1537 01/27/17 0000  Weight: 72.6 kg (160 lb) 68.6 kg (151 lb 3.2 oz)    Examination: Constitutional: NAD Vitals:   01/28/17 0435 01/28/17 1407 01/28/17 2242 01/29/17 0615  BP: (!) 134/49 138/63 (!) 136/59 (!) 123/51  Pulse: 82 83 74 66  Resp: 16 16 18 18   Temp: 98 F (36.7 C) 97.7 F (36.5 C) 98.2 F (36.8 C) 97.5 F (36.4 C)  TempSrc: Oral Oral Oral Oral  SpO2: 94% 96% 93% 94%  Weight:      Height:       Eyes:  lids and conjunctivae normal. No icterus ENMT: Mucous membranes are moist.  Respiratory: clear to auscultation bilaterally, no wheezing, no crackles. Normal respiratory effort. No accessory muscle use.  Cardiovascular: Regular rate and rhythm, no murmurs / rubs / gallops..  Abdomen: Diffuse mild tenderness to palpation. No distension, guarding. Bowel sounds positive.  Musculoskeletal: no clubbing / cyanosis Skin: no rashes, lesions, ulcers.  Data Reviewed: I have personally reviewed following labs and imaging studies  CBC:  Recent Labs Lab 01/26/17 1015 01/26/17 1553 01/27/17 0604 01/27/17 1515 01/28/17 0454 01/29/17 0458  WBC 11.3* 12.4* 15.6* 16.1* 13.9* 8.2  NEUTROABS 10.0*  --   --   --   --   --   HGB 12.5 12.5 11.9* 14.0 11.4* 10.5*  HCT 38.7 38.4 35.7* 42.6 35.3* 33.1*  MCV 94.2 93.7 92.7 93.8 93.6 94.0  PLT 249 240 224 244 218 970   Basic Metabolic Panel:  Recent Labs Lab 01/26/17 1553 01/27/17 0604 01/28/17 0454 01/29/17 0458  NA 143 140 141 145  K 4.4 3.8  3.6 3.5  CL 107 108 110 115*  CO2 26 25 24 23   GLUCOSE 137* 122* 111* 87  BUN 16 13 10 13   CREATININE 0.90 0.89 0.88 0.77  CALCIUM 9.6 8.4* 8.2* 8.2*   GFR: Estimated Creatinine Clearance: 46.4 mL/min (by C-G formula based on SCr of 0.77 mg/dL). Liver Function Tests:  Recent Labs Lab 01/26/17 1553 01/27/17 0604 01/28/17 0454 01/29/17 0458  AST 26 21 25 18   ALT 16 14 14 14   ALKPHOS 68 54 52 48  BILITOT 0.3 0.5 0.8 0.7  PROT 7.5 6.2* 6.4* 6.2*  ALBUMIN 3.6 3.0* 2.9* 2.6*    Recent Labs Lab 01/26/17 1553  LIPASE 45   Coagulation Profile:  Recent Labs Lab 01/27/17 0948  INR 1.10   Urine analysis:    Component Value Date/Time   COLORURINE YELLOW 01/26/2017 1615   APPEARANCEUR HAZY (A) 01/26/2017 1615   LABSPEC 1.019 01/26/2017 1615   PHURINE 5.0 01/26/2017 1615   GLUCOSEU NEGATIVE 01/26/2017 1615   HGBUR SMALL (A) 01/26/2017 1615   BILIRUBINUR NEGATIVE 01/26/2017 1615   KETONESUR NEGATIVE 01/26/2017 1615   PROTEINUR NEGATIVE 01/26/2017 1615   UROBILINOGEN 0.2 12/08/2013 1848   NITRITE POSITIVE (A) 01/26/2017 1615   LEUKOCYTESUR MODERATE (A) 01/26/2017 1615   Sepsis Labs: Invalid input(s): PROCALCITONIN, LACTICIDVEN  Recent Results (from the past 240 hour(s))  Aerobic/Anaerobic Culture (surgical/deep wound)     Status: None (Preliminary result)   Collection Time: 01/27/17  6:30 PM  Result Value Ref Range Status   Specimen Description GALL BLADDER  Final   Special Requests Normal  Final   Gram Stain   Final    ABUNDANT WBC PRESENT, PREDOMINANTLY PMN RARE GRAM NEGATIVE RODS Performed at [facility], [address] [place], [place] [address]    Culture PENDING  Incomplete   Report Status PENDING  Incomplete      Radiology Studies: Ir Perc Cholecystostomy  Result Date: 01/27/2017 INDICATION: Acute calculus cholecystitis EXAM: ULTRASOUND AND FLUOROSCOPIC 10 FRENCH PERCUTANEOUS TRANSHEPATIC CHOLECYSTOSTOMY MEDICATIONS: ANCEF 2 G; The antibiotic  was administered within an appropriate time frame prior to the initiation of the procedure. ANESTHESIA/SEDATION: Moderate (conscious) sedation was employed during this procedure. A total of Versed 1.0 mg and Fentanyl 50 mcg was administered intravenously. Moderate Sedation Time: 11 minutes. The patient's level of consciousness and vital signs were monitored continuously by radiology nursing throughout the procedure under my direct supervision. FLUOROSCOPY TIME:  Fluoroscopy Time:  12 seconds (3 mGy). COMPLICATIONS: None immediate. PROCEDURE: Informed written consent was obtained from the PATIENT'S DAUGHTER after a thorough discussion of the procedural risks, benefits and alternatives. All questions were addressed. Maximal Sterile Barrier Technique was utilized including caps, mask, sterile gowns, sterile gloves, sterile drape, hand hygiene and skin antiseptic.  A timeout was performed prior to the initiation of the procedure. Previous imaging reviewed. Preliminary ultrasound performed. Gallbladder was localized in the right mid abdomen. Transhepatic window was marked. Under sterile conditions and local anesthesia, a 21 gauge needle was advanced from a percutaneous transhepatic approach into the distended gallbladder. Needle position confirmed with ultrasound. Images obtained for documentation. Guidewire inserted followed by the Accustick dilator set. Amplatz guidewire exchange performed. Tract dilatation performed to insert a 10 [place] drain. Catheter formed in the gallbladder. Syringe aspiration yielded 65 cc headache GJ tube bile. Sample sent for culture. Catheter secured with a Prolene suture and connected to external gravity drainage bag. Sterile dressing applied. No immediate complication. Patient tolerated the procedure well. IMPRESSION: Ultrasound and fluoroscopic 10 French percutaneous transhepatic cholecystostomy. Electronically Signed   By: Jerilynn Mages.  Shick M.D.   On: 01/27/2017 18:26   Dg Abd Portable  2v  Result Date: 01/28/2017 CLINICAL DATA:  83 year old female post percutaneous cholecystotomy 01/27/2017. Right abdominal pain. Subsequent encounter. EXAM: PORTABLE ABDOMEN - 2 VIEW COMPARISON:  01/27/2017. FINDINGS: Right sided percutaneous cholecystostomy tube is in place. No free intraperitoneal air or plain film evidence of bowel obstruction. Surgical clips right abdomen. IMPRESSION: Percutaneous cholecystostomy tube is in place. No free intraperitoneal air or plain film evidence of bowel obstruction. Electronically Signed   By: Genia Del M.D.   On: 01/28/2017 11:29     Scheduled Meds: . cefTRIAXone (ROCEPHIN)  IV  1 g Intravenous Q24H  . enoxaparin (LOVENOX) injection  40 mg Subcutaneous Q24H   Continuous Infusions:   Costin M. Cruzita Lederer, MD [facility] [phone]  If 7PM-7AM, please contact night-coverage [email or web address] Password [email or web address] 01/29/2017, 11:00 AM

## 2017-01-29 NOTE — NC FL2 (Signed)
  [place] LEVEL OF CARE SCREENING TOOL     IDENTIFICATION  Patient Name: Cynthia [name] Birthdate: September 06, 1925 Sex: female Admission Date (Current Location): 01/26/2017  [facility] and [place] Number:  Herbalist and Address:  [facility],  [place] [address], [place]      Provider Number: [id number]  Attending Physician Name and Address:  Caren Griffins, MD  Relative Name and Phone Number:       Current Level of Care: Hospital Recommended Level of Care: [place] Prior Approval Number:    Date Approved/Denied:   PASRR Number:    Discharge Plan: SNF    Current Diagnoses: Patient Active Problem List   Diagnosis Date Noted  . Cholecystitis, acute with cholelithiasis 01/26/2017  . HTN (hypertension) 08/26/2016  . Renal insufficiency, mild 03/15/2016  . UTI (urinary tract infection) 12/11/2013  . Breast cancer ([place]) 12/11/2013  . Benign neoplasm of colon 12/11/2013  . Dementia 12/11/2013  . C2 cervical fracture ([place]) 12/08/2013  . Syncope 12/08/2013    Orientation RESPIRATION BLADDER Height & Weight     Self  Normal Incontinent Weight: 151 lb 3.2 oz (68.6 kg) Height:  5\' 5"  (165.1 cm)  BEHAVIORAL SYMPTOMS/MOOD NEUROLOGICAL BOWEL NUTRITION STATUS      Continent Diet (full liquid fluid consistency thin)  AMBULATORY STATUS COMMUNICATION OF NEEDS Skin   Limited Assist Verbally Surgical wounds                       Personal Care Assistance Level of Assistance  Bathing, Dressing, Feeding Bathing Assistance: Limited assistance Feeding assistance: Independent Dressing Assistance: Limited assistance     Functional Limitations Info             SPECIAL CARE FACTORS FREQUENCY                       Contractures      Additional Factors Info  Allergies, Code Status Code Status Info:  (full code) Allergies Info:  (no known allergies)           Current Medications (01/29/2017):  This is  the current hospital active medication list Current Facility-Administered Medications  Medication Dose Route Frequency Provider Last Rate Last Dose  . cefTRIAXone (ROCEPHIN) 1 g in dextrose 5 % 50 mL IVPB  1 g Intravenous Q24H Caren Griffins, MD   1 g at 01/28/17 1849  . enoxaparin (LOVENOX) injection 40 mg  40 mg Subcutaneous Q24H Oswald Hillock, MD   40 mg at 01/29/17 0956  . iopamidol (ISOVUE-300) 61 % injection 50 mL  50 mL Other Once PRN [name], MD      . morphine 2 MG/ML injection 2 mg  2 mg Intravenous Q4H PRN Oswald Hillock, MD   2 mg at 01/27/17 1846  . ondansetron (ZOFRAN) injection 4 mg  4 mg Intravenous Q6H PRN Oswald Hillock, MD         Discharge Medications: Please see discharge summary for a list of discharge medications.  Relevant Imaging Results:  Relevant Lab Results:   Additional [place], LCSW

## 2017-01-29 NOTE — Progress Notes (Signed)
Plan for d/c to SNF, discharge planning per CSW. [phone] 

## 2017-01-30 ENCOUNTER — Inpatient Hospital Stay
Admission: RE | Admit: 2017-01-30 | Discharge: 2017-10-05 | Disposition: A | Discharge status: Other Acute Inpatient Hospital | Payer: Medicare Other | Source: Ambulatory Visit | Attending: Internal Medicine | Admitting: Internal Medicine

## 2017-01-30 ENCOUNTER — Other Ambulatory Visit: Payer: Self-pay | Admitting: Internal Medicine

## 2017-01-30 DIAGNOSIS — Z0000 Encounter for general adult medical examination without abnormal findings: Secondary | ICD-10-CM

## 2017-01-30 DIAGNOSIS — K819 Cholecystitis, unspecified: Principal | ICD-10-CM

## 2017-01-30 DIAGNOSIS — K8001 Calculus of gallbladder with acute cholecystitis with obstruction: Secondary | ICD-10-CM

## 2017-01-30 DIAGNOSIS — R52 Pain, unspecified: Secondary | ICD-10-CM

## 2017-01-30 LAB — COMPREHENSIVE METABOLIC PANEL
ALT: 13 U/L — ABNORMAL LOW (ref 14–54)
AST: 19 U/L (ref 15–41)
Albumin: 2.9 g/dL — ABNORMAL LOW (ref 3.5–5.0)
Alkaline Phosphatase: 50 U/L (ref 38–126)
Anion gap: 8 (ref 5–15)
BUN: 9 mg/dL (ref 6–20)
CO2: 24 mmol/L (ref 22–32)
Calcium: 8.5 mg/dL — ABNORMAL LOW (ref 8.9–10.3)
Chloride: 112 mmol/L — ABNORMAL HIGH (ref 101–111)
Creatinine, Ser: 0.81 mg/dL (ref 0.44–1.00)
GFR calc Af Amer: >60 mL/min (ref >60)
GFR calc non Af Amer: >60 mL/min (ref >60)
Glucose, Bld: 90 mg/dL (ref 65–99)
Potassium: 3.1 mmol/L — ABNORMAL LOW (ref 3.5–5.1)
Sodium: 144 mmol/L (ref 135–145)
Total Bilirubin: 0.6 mg/dL (ref 0.3–1.2)
Total Protein: 6.7 g/dL (ref 6.5–8.1)

## 2017-01-30 LAB — CBC
HCT: 34.9 % — ABNORMAL LOW (ref 36.0–46.0)
Hemoglobin: 11.1 g/dL — ABNORMAL LOW (ref 12.0–15.0)
MCH: 29.8 pg (ref 26.0–34.0)
MCHC: 31.8 g/dL (ref 30.0–36.0)
MCV: 93.6 fL (ref 78.0–100.0)
Platelets: 236 10*3/uL (ref 150–400)
RBC: 3.73 MIL/uL — ABNORMAL LOW (ref 3.87–5.11)
RDW: 16.2 % — ABNORMAL HIGH (ref 11.5–15.5)
WBC: 7.1 10*3/uL (ref 4.0–10.5)

## 2017-01-30 LAB — URINE CULTURE: Culture: 30000 — AB

## 2017-01-30 MED ORDER — CEFUROXIME AXETIL 500 MG PO TABS: 500.0000 mg | ORAL_TABLET | Freq: Two times a day (BID) | ORAL | Status: AC

## 2017-01-30 NOTE — Discharge Summary (Signed)
Physician Discharge Summary  Cynthia [name] DGL:[id number] DOB: July 14, 1925 DOA: 01/26/2017  PCP: [place] date: 01/26/2017 Discharge date: 01/30/2017  Admitted From: SNF Disposition: SNF  Recommendations for Outpatient Follow-up:  1. Follow up with PCP in 1-2 weeks 2. Please obtain BMP/CBC in one week. 3. Ceftin for 7 more days.  Home Health: NA Equipment/Devices:NA  Discharge Condition: Stable CODE STATUS: Full Code Diet recommendation: Heart healthy diet, consistency: Soft, liquids: Thin Brief/Interim Summary: 83 year old with history of breast cancer post mastectomy, post colectomy, and advanced dementia who resides at a skilled nursing facility was admitted from the ED on 01/26/17 for RUQ abdominal pain workup resulting in acute cholecystitis and managed with cholecystostomy on 4/8 by Dr. Annamaria Boots.  She was also found to have a UTI and she is empirically treated with Rocephin.  Discharge Diagnoses:  Active Problems:   UTI (urinary tract infection)   HTN (hypertension)   Cholecystitis, acute with cholelithiasis   Acute cholecystitis -Patient was transferred from [facility] to [facility] After she was presented with RUQ abdominal pain. -Workup showed acute cholecystitis, seen by general surgery and declared not to be a surgical candidate. -Percutaneous cholecystostomy done by interventional radiology. -Culture showed Escherichia coli which is pansensitive, she was on Rocephin. -On discharge Ceftin for 7 more days to make total of 10 days of antibiotics. -Tolerated full liquids, advanced to soft diet.  UTI -UA showed TNTC pansensitive Escherichia coli, was on ceftriaxone. -On discharge getting Ceftin for 7 more days for her acute cholecystitis  Hypertension -Stable, currently on the medication, suspect somewhat chronically elevated  History of breast cancer and colon cancer  -In remote past. No acute issues.  Advanced dementia   -Supportive care at risk for delirium, minimize narcotics and benzodiazepines.   Discharge Instructions  Discharge Instructions    Diet - low sodium heart healthy    Complete by:  As directed    Increase activity slowly    Complete by:  As directed      Allergies as of 01/30/2017   No Known Allergies     Medication List    TAKE these medications   AMBULATORY NON FORMULARY MEDICATION Take 120 mLs by mouth at bedtime. Magic Cup 124ml by mouth at bedtime to increase nutrition   cefUROXime 500 MG tablet Commonly known as:  CEFTIN Take 1 tablet (500 mg total) by mouth 2 (two) times daily with a meal.   Cholecalciferol 1000 units tablet Take 1,000 Units by mouth at bedtime.   donepezil 10 MG tablet Commonly known as:  ARICEPT Take 1 tablet by mouth at bedtime.   TUMS 500 MG chewable tablet Generic drug:  calcium carbonate Chew 1 tablet by mouth daily.   Vitamin B-12 1000 MCG Subl Place 1 tablet under the tongue at bedtime.       No Known Allergies  Consultations:  Treatment Team:   Vickie Epley, MD   Procedures (Echo, Carotid, EGD, Colonoscopy, ERCP)   Radiological studies: US Abdomen Complete  Result Date: 01/26/2017 CLINICAL DATA:  Abdominal pain EXAM: ABDOMEN ULTRASOUND COMPLETE COMPARISON:  CT 01/26/2017 FINDINGS: Gallbladder: Gallstones are noted along the dependent wall with surrounding tumefactive biliary sludge. Intraluminal mass within the gallbladder is believed less likely given lack of vascularity. Gallbladder wall thickening up to 7 mm in thickness with trace pericholecystic fluid. Positive sonographic Murphy's. Common bile duct: Diameter: 6.3 mm.  No choledocholithiasis. Liver: No focal lesion identified. Mildly echogenic consistent fatty infiltration. No  biliary dilatation. IVC: No abnormality visualized. Pancreas: Visualized portion unremarkable. The pancreas is largely obscured by overlying bowel gas however. Spleen: Size and appearance within  normal limits. Right Kidney: Length: 7.4 cm. Echogenicity within normal limits. No mass or hydronephrosis visualized. Left Kidney: Length: 9.7 cm. Echogenicity within normal limits. No mass or hydronephrosis visualized. Abdominal aorta: No aneurysm visualized. Other findings: None. IMPRESSION: Cholelithiasis and tumefactive biliary sludge. This in conjunction with gallbladder wall thickening, positive sonographic Murphy's and mild pericholecystic fluid and edema is concerning for changes of acute cholecystitis. Electronically Signed   By: Ashley Royalty M.D.   On: 01/26/2017 21:24   Ct Abdomen Pelvis W Contrast  Result Date: 01/26/2017 CLINICAL DATA:  Abdominal pain. EXAM: CT ABDOMEN AND PELVIS WITH CONTRAST TECHNIQUE: Multidetector CT imaging of the abdomen and pelvis was performed using the standard protocol following bolus administration of intravenous contrast. CONTRAST:  120mL ISOVUE-300 IOPAMIDOL (ISOVUE-300) INJECTION 61% COMPARISON:  None. FINDINGS: Lower chest: Small hiatal hernia. Hepatobiliary: The liver and biliary tree are within normal. Gallbladder is mildly distended with wall thickening and cholelithiasis. Possible small amount of pericholecystic fluid. Pancreas: Within normal. Spleen: Within normal. Adrenals/Urinary Tract: Adrenal glands are normal. Right kidney is somewhat small as there is mild cortical scarring over the upper and lower pole right kidney. Left kidney within normal. No hydronephrosis or nephrolithiasis. Ureters and bladder are normal. Stomach/Bowel: Small hiatal hernia as the stomach is otherwise within normal. Small bowel is normal. Surgical suture line over the right colon. Appendix not visualized. Extensive diverticulosis throughout the colon most prominent over the sigmoid colon. Vascular/Lymphatic: Mild calcified plaque over the abdominal aorta and iliac arteries. Vascular structures otherwise within normal. No adenopathy. Reproductive: Within normal.  Pessary present over the  midline. Other: Diastases of the rectus abdominus muscles at the level of the umbilicus. Multiple surgical clips over the right abdomen. Musculoskeletal: Degenerative change of the spine and hips. IMPRESSION: Dilatation of the gallbladder with wall thickening and cholelithiasis. Possible small amount of adjacent free fluid. Recommend clinical correlation and possible right upper quadrant ultrasound as findings may be due to acute cholecystitis. Extensive colonic diverticulosis.  Previous partial right colectomy. Somewhat small right kidney with areas of cortical scarring. Small hiatal hernia. Diastases of the rectus abdominus muscles at the level of the umbilicus. Electronically Signed   By: Marin Olp M.D.   On: 01/26/2017 18:21   Ir Perc Cholecystostomy  Result Date: 01/27/2017 INDICATION: Acute calculus cholecystitis EXAM: ULTRASOUND AND FLUOROSCOPIC 10 FRENCH PERCUTANEOUS TRANSHEPATIC CHOLECYSTOSTOMY MEDICATIONS: ANCEF 2 G; The antibiotic was administered within an appropriate time frame prior to the initiation of the procedure. ANESTHESIA/SEDATION: Moderate (conscious) sedation was employed during this procedure. A total of Versed 1.0 mg and Fentanyl 50 mcg was administered intravenously. Moderate Sedation Time: 11 minutes. The patient's level of consciousness and vital signs were monitored continuously by radiology nursing throughout the procedure under my direct supervision. FLUOROSCOPY TIME:  Fluoroscopy Time:  12 seconds (3 mGy). COMPLICATIONS: None immediate. PROCEDURE: Informed written consent was obtained from the PATIENT'S DAUGHTER after a thorough discussion of the procedural risks, benefits and alternatives. All questions were addressed. Maximal Sterile Barrier Technique was utilized including caps, mask, sterile gowns, sterile gloves, sterile drape, hand hygiene and skin antiseptic. A timeout was performed prior to the initiation of the procedure. Previous imaging reviewed. Preliminary  ultrasound performed. Gallbladder was localized in the right mid abdomen. Transhepatic window was marked. Under sterile conditions and local anesthesia, a 21 gauge needle was advanced from a  percutaneous transhepatic approach into the distended gallbladder. Needle position confirmed with ultrasound. Images obtained for documentation. Guidewire inserted followed by the Accustick dilator set. Amplatz guidewire exchange performed. Tract dilatation performed to insert a 10 [place] drain. Catheter formed in the gallbladder. Syringe aspiration yielded 65 cc headache GJ tube bile. Sample sent for culture. Catheter secured with a Prolene suture and connected to external gravity drainage bag. Sterile dressing applied. No immediate complication. Patient tolerated the procedure well. IMPRESSION: Ultrasound and fluoroscopic 10 French percutaneous transhepatic cholecystostomy. Electronically Signed   By: Jerilynn Mages.  Shick M.D.   On: 01/27/2017 18:26   Dg Abd Portable 2v  Result Date: 01/28/2017 CLINICAL DATA:  83 year old female post percutaneous cholecystotomy 01/27/2017. Right abdominal pain. Subsequent encounter. EXAM: PORTABLE ABDOMEN - 2 VIEW COMPARISON:  01/27/2017. FINDINGS: Right sided percutaneous cholecystostomy tube is in place. No free intraperitoneal air or plain film evidence of bowel obstruction. Surgical clips right abdomen. IMPRESSION: Percutaneous cholecystostomy tube is in place. No free intraperitoneal air or plain film evidence of bowel obstruction. Electronically Signed   By: Genia Del M.D.   On: 01/28/2017 11:29     Subjective:  Discharge Exam: Vitals:   01/29/17 0615 01/29/17 1444 01/29/17 2038 01/30/17 0600  BP: (!) 123/51 (!) 111/56 139/66 (!) 135/92  Pulse: 66 65 71 63  Resp: 18 18 17 16   Temp: 97.5 F (36.4 C) 97.6 F (36.4 C) 97.7 F (36.5 C) 97.5 F (36.4 C)  TempSrc: Oral Oral Oral Oral  SpO2: 94% 96% 95% 97%  Weight:      Height:       General: Pt is alert, awake, not in acute  distress Cardiovascular: RRR, S1/S2 +, no rubs, no gallops Respiratory: CTA bilaterally, no wheezing, no rhonchi Abdominal: Soft, NT, ND, bowel sounds + Extremities: no edema, no cyanosis   The results of significant diagnostics from this hospitalization (including imaging, microbiology, ancillary and laboratory) are listed below for reference.    Microbiology: Recent Results (from the past 240 hour(s))  Urine culture     Status: Abnormal   Collection Time: 01/26/17  4:20 PM  Result Value Ref Range Status   Specimen Description URINE, CATHETERIZED  Final   Special Requests NONE  Final   Culture 30,000 COLONIES/mL ESCHERICHIA COLI (A)  Final   Report Status 01/30/2017 FINAL  Final   Organism ID, Bacteria ESCHERICHIA COLI (A)  Final      Susceptibility   Escherichia coli - MIC*    AMPICILLIN <=2 SENSITIVE Sensitive     CEFAZOLIN <=4 SENSITIVE Sensitive     CEFTRIAXONE <=1 SENSITIVE Sensitive     CIPROFLOXACIN <=0.25 SENSITIVE Sensitive     GENTAMICIN <=1 SENSITIVE Sensitive     IMIPENEM <=0.25 SENSITIVE Sensitive     NITROFURANTOIN <=16 SENSITIVE Sensitive     TRIMETH/SULFA <=20 SENSITIVE Sensitive     AMPICILLIN/SULBACTAM <=2 SENSITIVE Sensitive     PIP/TAZO <=4 SENSITIVE Sensitive     Extended ESBL NEGATIVE Sensitive     * 30,000 COLONIES/mL ESCHERICHIA COLI  Aerobic/Anaerobic Culture (surgical/deep wound)     Status: None (Preliminary result)   Collection Time: 01/27/17  6:30 PM  Result Value Ref Range Status   Specimen Description GALL BLADDER  Final   Special Requests Normal  Final   Gram Stain   Final    ABUNDANT WBC PRESENT, PREDOMINANTLY PMN RARE GRAM NEGATIVE RODS    Culture   Final    FEW ESCHERICHIA COLI HOLDING FOR POSSIBLE ANAEROBE Performed at [facility], [place] [address], [place], [place] [address]    Report Status PENDING  Incomplete   Organism ID, Bacteria ESCHERICHIA COLI  Final      Susceptibility   Escherichia coli - MIC*    AMPICILLIN 4  SENSITIVE Sensitive     CEFAZOLIN <=4 SENSITIVE Sensitive     CEFEPIME <=1 SENSITIVE Sensitive     CEFTAZIDIME <=1 SENSITIVE Sensitive     CEFTRIAXONE <=1 SENSITIVE Sensitive     CIPROFLOXACIN <=0.25 SENSITIVE Sensitive     GENTAMICIN <=1 SENSITIVE Sensitive     IMIPENEM <=0.25 SENSITIVE Sensitive     TRIMETH/SULFA <=20 SENSITIVE Sensitive     AMPICILLIN/SULBACTAM 4 SENSITIVE Sensitive     Extended ESBL NEGATIVE Sensitive     * FEW ESCHERICHIA COLI     Labs: BNP (last 3 results) No results for input(s): BNP in the last 8760 hours. Basic Metabolic Panel:  Recent Labs Lab 01/26/17 1553 01/27/17 0604 01/28/17 0454 01/29/17 0458 01/30/17 0504  NA 143 140 141 145 144  K 4.4 3.8 3.6 3.5 3.1*  CL 107 108 110 115* 112*  CO2 26 25 24 23 24   GLUCOSE 137* 122* 111* 87 90  BUN 16 13 10 13 9   CREATININE 0.90 0.89 0.88 0.77 0.81  CALCIUM 9.6 8.4* 8.2* 8.2* 8.5*   Liver Function Tests:  Recent Labs Lab 01/26/17 1553 01/27/17 0604 01/28/17 0454 01/29/17 0458 01/30/17 0504  AST 26 21 25 18 19   ALT 16 14 14 14  13*  ALKPHOS 68 54 52 48 50  BILITOT 0.3 0.5 0.8 0.7 0.6  PROT 7.5 6.2* 6.4* 6.2* 6.7  ALBUMIN 3.6 3.0* 2.9* 2.6* 2.9*    Recent Labs Lab 01/26/17 1553  LIPASE 45   No results for input(s): AMMONIA in the last 168 hours. CBC:  Recent Labs Lab 01/26/17 1015  01/27/17 0604 01/27/17 1515 01/28/17 0454 01/29/17 0458 01/30/17 0504  WBC 11.3*  < > 15.6* 16.1* 13.9* 8.2 7.1  NEUTROABS 10.0*  --   --   --   --   --   --   HGB 12.5  < > 11.9* 14.0 11.4* 10.5* 11.1*  HCT 38.7  < > 35.7* 42.6 35.3* 33.1* 34.9*  MCV 94.2  < > 92.7 93.8 93.6 94.0 93.6  PLT 249  < > 224 244 218 212 236  < > = values in this interval not displayed. Cardiac Enzymes: No results for input(s): CKTOTAL, CKMB, CKMBINDEX, TROPONINI in the last 168 hours. BNP: Invalid input(s): POCBNP CBG: No results for input(s): GLUCAP in the last 168 hours. D-Dimer No results for input(s): DDIMER in  the last 72 hours. Hgb A1c No results for input(s): HGBA1C in the last 72 hours. Lipid Profile No results for input(s): CHOL, HDL, LDLCALC, TRIG, CHOLHDL, LDLDIRECT in the last 72 hours. Thyroid function studies No results for input(s): TSH, T4TOTAL, T3FREE, THYROIDAB in the last 72 hours.  Invalid input(s): FREET3 Anemia work up No results for input(s): VITAMINB12, FOLATE, FERRITIN, TIBC, IRON, RETICCTPCT in the last 72 hours. Urinalysis    Component Value Date/Time   COLORURINE YELLOW 01/26/2017 1615   APPEARANCEUR HAZY (A) 01/26/2017 1615   LABSPEC 1.019 01/26/2017 1615   PHURINE 5.0 01/26/2017 1615   GLUCOSEU NEGATIVE 01/26/2017 1615   HGBUR SMALL (A) 01/26/2017 1615   BILIRUBINUR NEGATIVE 01/26/2017 1615   KETONESUR NEGATIVE 01/26/2017 1615   PROTEINUR NEGATIVE 01/26/2017 1615   UROBILINOGEN 0.2 12/08/2013 1848   NITRITE POSITIVE (A) 01/26/2017 1615  LEUKOCYTESUR MODERATE (A) 01/26/2017 1615   Sepsis Labs Invalid input(s): PROCALCITONIN,  WBC,  LACTICIDVEN Microbiology Recent Results (from the past 240 hour(s))  Urine culture     Status: Abnormal   Collection Time: 01/26/17  4:20 PM  Result Value Ref Range Status   Specimen Description URINE, CATHETERIZED  Final   Special Requests NONE  Final   Culture 30,000 COLONIES/mL ESCHERICHIA COLI (A)  Final   Report Status 01/30/2017 FINAL  Final   Organism ID, Bacteria ESCHERICHIA COLI (A)  Final      Susceptibility   Escherichia coli - MIC*    AMPICILLIN <=2 SENSITIVE Sensitive     CEFAZOLIN <=4 SENSITIVE Sensitive     CEFTRIAXONE <=1 SENSITIVE Sensitive     CIPROFLOXACIN <=0.25 SENSITIVE Sensitive     GENTAMICIN <=1 SENSITIVE Sensitive     IMIPENEM <=0.25 SENSITIVE Sensitive     NITROFURANTOIN <=16 SENSITIVE Sensitive     TRIMETH/SULFA <=20 SENSITIVE Sensitive     AMPICILLIN/SULBACTAM <=2 SENSITIVE Sensitive     PIP/TAZO <=4 SENSITIVE Sensitive     Extended ESBL NEGATIVE Sensitive     * 30,000 COLONIES/mL  ESCHERICHIA COLI  Aerobic/Anaerobic Culture (surgical/deep wound)     Status: None (Preliminary result)   Collection Time: 01/27/17  6:30 PM  Result Value Ref Range Status   Specimen Description GALL BLADDER  Final   Special Requests Normal  Final   Gram Stain   Final    ABUNDANT WBC PRESENT, PREDOMINANTLY PMN RARE GRAM NEGATIVE RODS    Culture   Final    FEW ESCHERICHIA COLI HOLDING FOR POSSIBLE ANAEROBE Performed at [facility], [address] [place], [place] [address]    Report Status PENDING  Incomplete   Organism ID, Bacteria ESCHERICHIA COLI  Final      Susceptibility   Escherichia coli - MIC*    AMPICILLIN 4 SENSITIVE Sensitive     CEFAZOLIN <=4 SENSITIVE Sensitive     CEFEPIME <=1 SENSITIVE Sensitive     CEFTAZIDIME <=1 SENSITIVE Sensitive     CEFTRIAXONE <=1 SENSITIVE Sensitive     CIPROFLOXACIN <=0.25 SENSITIVE Sensitive     GENTAMICIN <=1 SENSITIVE Sensitive     IMIPENEM <=0.25 SENSITIVE Sensitive     TRIMETH/SULFA <=20 SENSITIVE Sensitive     AMPICILLIN/SULBACTAM 4 SENSITIVE Sensitive     Extended ESBL NEGATIVE Sensitive     * FEW ESCHERICHIA COLI     Time coordinating discharge: Over 30 minutes  SIGNED:   Birdie Hopes, MD  [facility] 01/30/2017, 11:25 AM Pager   If 7PM-7AM, please contact night-coverage [email or web address] Password [email or web address]

## 2017-01-30 NOTE — Progress Notes (Signed)
Patient being discharged today.  Her instructions for her perc chole drain have been placed in her DC instruction section and an order has been placed for her follow up in 5 weeks in our clinic.  They will contact her for the appointment date and time.  Antonis Lor E 01/30/2017

## 2017-01-30 NOTE — Progress Notes (Signed)
Patient report was called to [facility] at [phone] [facility]. Iona Beard was familiar with Mrs. Hon, I updated him on her status, answered all questions, and [facility] was called for transport.

## 2017-01-30 NOTE — Progress Notes (Signed)
LCSW following for d/c planning.  Pt returning to [facility]. CSW spoke with facility and they are ready for pt. Call placed to daughter Barnetta Chapel [phone]- left voicemail.  Plan: d/c to [facility].  Report # provided to RN. Will arrange transport (expect [facility]) pending return call from daughter.  Sharren Bridge, MSW, LCSW Clinical Social Work 01/30/2017 [phone]

## 2017-01-31 ENCOUNTER — Encounter: Payer: Self-pay | Admitting: Internal Medicine

## 2017-01-31 ENCOUNTER — Non-Acute Institutional Stay (SKILLED_NURSING_FACILITY): Payer: Medicare Other | Admitting: Internal Medicine

## 2017-01-31 DIAGNOSIS — Z434 Encounter for attention to other artificial openings of digestive tract: Secondary | ICD-10-CM | POA: Diagnosis not present

## 2017-01-31 DIAGNOSIS — F0390 Unspecified dementia without behavioral disturbance: Secondary | ICD-10-CM | POA: Diagnosis not present

## 2017-01-31 DIAGNOSIS — I10 Essential (primary) hypertension: Secondary | ICD-10-CM

## 2017-01-31 DIAGNOSIS — K8001 Calculus of gallbladder with acute cholecystitis with obstruction: Secondary | ICD-10-CM | POA: Diagnosis not present

## 2017-01-31 NOTE — Progress Notes (Signed)
Location:   [facility] Room Number: 141/D Place of Service:  SNF (31) Provider:  [place]  Patient Care Team: [place] as PCP - General (Family Medicine)  Extended Emergency Contact Information Primary Emergency Contact: [name], [place] [address] [place] Phone: [phone] Relation: Friend Secondary Emergency Contact: Magallon,[name]  [place] Phone: [phone] Relation: Daughter  Code Status:  Full Code Goals of care: Advanced Directive information Advanced Directives 01/31/2017  Does Patient Have a Medical Advance Directive? Yes  Type of Advance Directive (No Data)  Does patient want to make changes to medical advance directive? No - Patient declined  Copy of [place] in Chart? -  Would patient like information on creating a medical advance directive? No - Patient declined     Chief Complaint  Patient presents with  . Readmit To SNF    HPI:  Pt is a 83 y.o. female seen today for an acute visit for Readmission to facility.  Patient has h/o  Breast cancer s/p Modified Mastectomy, S/P right hemi colectomy and past history of C2 fracture.  She was send to the hospital with Acute right abdominal pain. She had increased Leucocytes and [place] positive for acute Cholecystitis. Surgeon found her to be high Risk for Surgery so it was decided for IR to do Cholecystostomy with Drain. Plan is to continue this for 5 weeks and revaluate.  Patient was also treated for UTI and the fluid for her Cholecystitis drain was positive for E Coli sensitive so discharged on Ceftin.  Patient is doing well in facility. She is afebrile and her appetite is picking up. But she looked very confused and irritated with the drain. She does understand the procedure but due to her dementia she forgets and gets confused with the Bag. She  denies any abdominal pain , Nausea or vomiting.    Past Medical History:  Diagnosis Date  . Breast cancer ([place])   . Cognitive communication deficit   . Dementia   . Difficulty in walking(719.7)   . Fall   . Muscle weakness (generalized)   . Urinary tract infection, site not specified   . Vitamin B deficiency    Past Surgical History:  Procedure Laterality Date  . COLON SURGERY    . IR PERC CHOLECYSTOSTOMY  01/27/2017  . MASTECTOMY, PARTIAL Right     No Known Allergies  Allergies as of 01/31/2017   No Known Allergies     Medication List       Accurate as of 01/31/17 11:14 AM. Always use your most recent med list.          AMBULATORY NON FORMULARY MEDICATION Take 120 mLs by mouth at bedtime. Magic Cup 140ml by mouth at bedtime to increase nutrition   cefUROXime 500 MG tablet Commonly known as:  CEFTIN Take 1 tablet (500 mg total) by mouth 2 (two) times daily with a meal.   Cholecalciferol 1000 units tablet Take 1,000 Units by mouth at bedtime.   donepezil 10 MG tablet Commonly known as:  ARICEPT Take 1 tablet by mouth at bedtime.   TUMS 500 MG chewable tablet Generic drug:  calcium carbonate Chew 1 tablet by mouth daily.   Vitamin B-12 1000 MCG Subl Place 1 tablet under the tongue at bedtime.       Review of Systems  Review of Systems  Constitutional:  Negative for activity change, appetite change, chills, diaphoresis, fatigue and fever.  HENT: Negative for mouth sores, postnasal drip, rhinorrhea, sinus pain and sore throat.   Respiratory: Negative for apnea, cough, chest tightness, shortness of breath and wheezing.   Cardiovascular: Negative for chest pain, palpitations and leg swelling.  Gastrointestinal: Negative for abdominal distention, abdominal pain, constipation, diarrhea, nausea and vomiting.  Genitourinary: Negative for dysuria and frequency.  Musculoskeletal: Negative for arthralgias, joint swelling and myalgias.  Skin: Negative for rash.    Neurological: Negative for dizziness, syncope, weakness, light-headedness and numbness.  Psychiatric/Behavioral: Negative for behavioral problems, confusion and sleep disturbance.     Immunization History  Administered Date(s) Administered  . Influenza-Unspecified 07/28/2014, 07/20/2016  . Pneumococcal-Unspecified 08/01/2016   Pertinent  Health Maintenance Due  Topic Date Due  . DEXA SCAN  04/18/2017 (Originally 08/25/1992)  . INFLUENZA VACCINE  05/22/2017  . PNA vac Low Risk Adult (2 of 2 - PCV13) 08/01/2017   No flowsheet data found. Functional Status Survey:    Vitals:   01/31/17 1113  BP: 136/66  Pulse: 77  Resp: 18  Temp: 97 F (36.1 C)  TempSrc: Oral   There is no height or weight on file to calculate BMI. Physical Exam  Constitutional: She appears well-developed and well-nourished.  HENT:  Head: Normocephalic.  Mouth/Throat: Oropharynx is clear and moist.  Eyes: Pupils are equal, round, and reactive to light.  Neck: Neck supple.  Cardiovascular: Normal rate, regular rhythm and normal heart sounds.   Pulmonary/Chest: Effort normal and breath sounds normal. No respiratory distress. She has no wheezes. She has no rales. She exhibits no tenderness.  Abdominal: Soft. Bowel sounds are normal. She exhibits no distension. There is no tenderness.  Drain was completely full with the Fluid  Musculoskeletal: She exhibits no edema.  Neurological: She is alert.  No Focal deficits.  Skin: Skin is warm and dry.  Psychiatric: Her behavior is normal. Judgment and thought content normal. Her mood appears anxious.    Labs reviewed:  Recent Labs  01/28/17 0454 01/29/17 0458 01/30/17 0504  NA 141 145 144  K 3.6 3.5 3.1*  CL 110 115* 112*  CO2 24 23 24   GLUCOSE 111* 87 90  BUN 10 13 9   CREATININE 0.88 0.77 0.81  CALCIUM 8.2* 8.2* 8.5*    Recent Labs  01/28/17 0454 01/29/17 0458 01/30/17 0504  AST 25 18 19   ALT 14 14 13*  ALKPHOS 52 48 50  BILITOT 0.8 0.7 0.6   PROT 6.4* 6.2* 6.7  ALBUMIN 2.9* 2.6* 2.9*    Recent Labs  05/02/16 0740 07/27/16 0715  01/26/17 1015  01/28/17 0454 01/29/17 0458 01/30/17 0504  WBC 6.6 6.6  < > 11.3*  < > 13.9* 8.2 7.1  NEUTROABS 3.9 3.6  --  10.0*  --   --   --   --   HGB 13.1 12.6  < > 12.5  < > 11.4* 10.5* 11.1*  HCT 40.8 39.2  < > 38.7  < > 35.3* 33.1* 34.9*  MCV 94.9 93.8  < > 94.2  < > 93.6 94.0 93.6  PLT 241 238  < > 249  < > 218 212 236  < > = values in this interval not displayed. Lab Results  Component Value Date   TSH 4.107 03/16/2016   No results found for: HGBA1C No results found for: CHOL, HDL, LDLCALC, LDLDIRECT, TRIG, CHOLHDL  Significant Diagnostic Results in last 30 days:  US Abdomen Complete  Result  Date: 01/26/2017 CLINICAL DATA:  Abdominal pain EXAM: ABDOMEN ULTRASOUND COMPLETE COMPARISON:  CT 01/26/2017 FINDINGS: Gallbladder: Gallstones are noted along the dependent wall with surrounding tumefactive biliary sludge. Intraluminal mass within the gallbladder is believed less likely given lack of vascularity. Gallbladder wall thickening up to 7 mm in thickness with trace pericholecystic fluid. Positive sonographic Murphy's. Common bile duct: Diameter: 6.3 mm.  No choledocholithiasis. Liver: No focal lesion identified. Mildly echogenic consistent fatty infiltration. No biliary dilatation. IVC: No abnormality visualized. Pancreas: Visualized portion unremarkable. The pancreas is largely obscured by overlying bowel gas however. Spleen: Size and appearance within normal limits. Right Kidney: Length: 7.4 cm. Echogenicity within normal limits. No mass or hydronephrosis visualized. Left Kidney: Length: 9.7 cm. Echogenicity within normal limits. No mass or hydronephrosis visualized. Abdominal aorta: No aneurysm visualized. Other findings: None. IMPRESSION: Cholelithiasis and tumefactive biliary sludge. This in conjunction with gallbladder wall thickening, positive sonographic Murphy's and mild  pericholecystic fluid and edema is concerning for changes of acute cholecystitis. Electronically Signed   By: Ashley Royalty M.D.   On: 01/26/2017 21:24   Ct Abdomen Pelvis W Contrast  Result Date: 01/26/2017 CLINICAL DATA:  Abdominal pain. EXAM: CT ABDOMEN AND PELVIS WITH CONTRAST TECHNIQUE: Multidetector CT imaging of the abdomen and pelvis was performed using the standard protocol following bolus administration of intravenous contrast. CONTRAST:  14mL ISOVUE-300 IOPAMIDOL (ISOVUE-300) INJECTION 61% COMPARISON:  None. FINDINGS: Lower chest: Small hiatal hernia. Hepatobiliary: The liver and biliary tree are within normal. Gallbladder is mildly distended with wall thickening and cholelithiasis. Possible small amount of pericholecystic fluid. Pancreas: Within normal. Spleen: Within normal. Adrenals/Urinary Tract: Adrenal glands are normal. Right kidney is somewhat small as there is mild cortical scarring over the upper and lower pole right kidney. Left kidney within normal. No hydronephrosis or nephrolithiasis. Ureters and bladder are normal. Stomach/Bowel: Small hiatal hernia as the stomach is otherwise within normal. Small bowel is normal. Surgical suture line over the right colon. Appendix not visualized. Extensive diverticulosis throughout the colon most prominent over the sigmoid colon. Vascular/Lymphatic: Mild calcified plaque over the abdominal aorta and iliac arteries. Vascular structures otherwise within normal. No adenopathy. Reproductive: Within normal.  Pessary present over the midline. Other: Diastases of the rectus abdominus muscles at the level of the umbilicus. Multiple surgical clips over the right abdomen. Musculoskeletal: Degenerative change of the spine and hips. IMPRESSION: Dilatation of the gallbladder with wall thickening and cholelithiasis. Possible small amount of adjacent free fluid. Recommend clinical correlation and possible right upper quadrant ultrasound as findings may be due to acute  cholecystitis. Extensive colonic diverticulosis.  Previous partial right colectomy. Somewhat small right kidney with areas of cortical scarring. Small hiatal hernia. Diastases of the rectus abdominus muscles at the level of the umbilicus. Electronically Signed   By: Marin Olp M.D.   On: 01/26/2017 18:21   Ir Perc Cholecystostomy  Result Date: 01/27/2017 INDICATION: Acute calculus cholecystitis EXAM: ULTRASOUND AND FLUOROSCOPIC 10 FRENCH PERCUTANEOUS TRANSHEPATIC CHOLECYSTOSTOMY MEDICATIONS: ANCEF 2 G; The antibiotic was administered within an appropriate time frame prior to the initiation of the procedure. ANESTHESIA/SEDATION: Moderate (conscious) sedation was employed during this procedure. A total of Versed 1.0 mg and Fentanyl 50 mcg was administered intravenously. Moderate Sedation Time: 11 minutes. The patient's level of consciousness and vital signs were monitored continuously by radiology nursing throughout the procedure under my direct supervision. FLUOROSCOPY TIME:  Fluoroscopy Time:  12 seconds (3 mGy). COMPLICATIONS: None immediate. PROCEDURE: Informed written consent was obtained from the PATIENT'S DAUGHTER after  a thorough discussion of the procedural risks, benefits and alternatives. All questions were addressed. Maximal Sterile Barrier Technique was utilized including caps, mask, sterile gowns, sterile gloves, sterile drape, hand hygiene and skin antiseptic. A timeout was performed prior to the initiation of the procedure. Previous imaging reviewed. Preliminary ultrasound performed. Gallbladder was localized in the right mid abdomen. Transhepatic window was marked. Under sterile conditions and local anesthesia, a 21 gauge needle was advanced from a percutaneous transhepatic approach into the distended gallbladder. Needle position confirmed with ultrasound. Images obtained for documentation. Guidewire inserted followed by the Accustick dilator set. Amplatz guidewire exchange performed. Tract  dilatation performed to insert a 10 [place] drain. Catheter formed in the gallbladder. Syringe aspiration yielded 65 cc headache GJ tube bile. Sample sent for culture. Catheter secured with a Prolene suture and connected to external gravity drainage bag. Sterile dressing applied. No immediate complication. Patient tolerated the procedure well. IMPRESSION: Ultrasound and fluoroscopic 10 French percutaneous transhepatic cholecystostomy. Electronically Signed   By: Jerilynn Mages.  Shick M.D.   On: 01/27/2017 18:26   Dg Abd Portable 2v  Result Date: 01/28/2017 CLINICAL DATA:  83 year old female post percutaneous cholecystotomy 01/27/2017. Right abdominal pain. Subsequent encounter. EXAM: PORTABLE ABDOMEN - 2 VIEW COMPARISON:  01/27/2017. FINDINGS: Right sided percutaneous cholecystostomy tube is in place. No free intraperitoneal air or plain film evidence of bowel obstruction. Surgical clips right abdomen. IMPRESSION: Percutaneous cholecystostomy tube is in place. No free intraperitoneal air or plain film evidence of bowel obstruction. Electronically Signed   By: Genia Del M.D.   On: 01/28/2017 11:29    Assessment/Plan  S/P Cholecystostomy By IR for Acute Cholecystitis. Continue the Drain. Follow up with Radiology on 15 th of May. Also on Ceftin for 1 week.  UTI Continue Ceftin Patient asymptomatic.   Essential hypertension BP controlled and stable Dementia Patient is having some hard time to understand. Continue Aricept.  Hypokalemia Her potassium was low in hospital not sure if supplemented. Will give her 20 meq today. Repeat labs tomorrow.   Family/ staff Communication:   Labs/tests ordered:  CMP, CBC

## 2017-02-02 LAB — AEROBIC/ANAEROBIC CULTURE W GRAM STAIN (SURGICAL/DEEP WOUND): Special Requests: NORMAL

## 2017-02-02 LAB — AEROBIC/ANAEROBIC CULTURE (SURGICAL/DEEP WOUND)

## 2017-02-04 ENCOUNTER — Encounter: Payer: Self-pay | Admitting: Internal Medicine

## 2017-02-04 ENCOUNTER — Non-Acute Institutional Stay (SKILLED_NURSING_FACILITY): Payer: Medicare Other | Admitting: Internal Medicine

## 2017-02-04 ENCOUNTER — Encounter (HOSPITAL_COMMUNITY)
Admission: RE | Admit: 2017-02-04 | Discharge: 2017-02-04 | Disposition: A | Discharge status: Home / Self Care | Payer: Medicare Other | Source: Skilled Nursing Facility | Attending: Internal Medicine | Admitting: Internal Medicine

## 2017-02-04 DIAGNOSIS — Z9181 History of falling: Secondary | ICD-10-CM | POA: Insufficient documentation

## 2017-02-04 DIAGNOSIS — R21 Rash and other nonspecific skin eruption: Secondary | ICD-10-CM | POA: Diagnosis not present

## 2017-02-04 DIAGNOSIS — R946 Abnormal results of thyroid function studies: Secondary | ICD-10-CM

## 2017-02-04 DIAGNOSIS — K649 Unspecified hemorrhoids: Secondary | ICD-10-CM | POA: Diagnosis not present

## 2017-02-04 DIAGNOSIS — Z434 Encounter for attention to other artificial openings of digestive tract: Secondary | ICD-10-CM

## 2017-02-04 DIAGNOSIS — F0390 Unspecified dementia without behavioral disturbance: Secondary | ICD-10-CM | POA: Diagnosis present

## 2017-02-04 DIAGNOSIS — E538 Deficiency of other specified B group vitamins: Secondary | ICD-10-CM | POA: Insufficient documentation

## 2017-02-04 DIAGNOSIS — R7989 Other specified abnormal findings of blood chemistry: Secondary | ICD-10-CM

## 2017-02-04 LAB — COMPREHENSIVE METABOLIC PANEL
ALT: 29 U/L (ref 14–54)
AST: 26 U/L (ref 15–41)
Albumin: 3.4 g/dL — ABNORMAL LOW (ref 3.5–5.0)
Alkaline Phosphatase: 60 U/L (ref 38–126)
Anion gap: 12 (ref 5–15)
BUN: 8 mg/dL (ref 6–20)
CO2: 27 mmol/L (ref 22–32)
Calcium: 9.1 mg/dL (ref 8.9–10.3)
Chloride: 103 mmol/L (ref 101–111)
Creatinine, Ser: 0.9 mg/dL (ref 0.44–1.00)
GFR calc Af Amer: >60 mL/min (ref >60)
GFR calc non Af Amer: 55 mL/min — ABNORMAL LOW (ref >60)
Glucose, Bld: 105 mg/dL — ABNORMAL HIGH (ref 65–99)
Potassium: 3.8 mmol/L (ref 3.5–5.1)
Sodium: 142 mmol/L (ref 135–145)
Total Bilirubin: 0.3 mg/dL (ref 0.3–1.2)
Total Protein: 7.4 g/dL (ref 6.5–8.1)

## 2017-02-04 LAB — CBC WITH DIFFERENTIAL/PLATELET
Basophils Absolute: 0 10*3/uL (ref 0.0–0.1)
Basophils Relative: 1 %
Eosinophils Absolute: 0.5 10*3/uL (ref 0.0–0.7)
Eosinophils Relative: 6 %
HCT: 39.8 % (ref 36.0–46.0)
Hemoglobin: 12.8 g/dL (ref 12.0–15.0)
Lymphocytes Relative: 25 %
Lymphs Abs: 1.9 10*3/uL (ref 0.7–4.0)
MCH: 30.2 pg (ref 26.0–34.0)
MCHC: 32.2 g/dL (ref 30.0–36.0)
MCV: 93.9 fL (ref 78.0–100.0)
Monocytes Absolute: 0.5 10*3/uL (ref 0.1–1.0)
Monocytes Relative: 6 %
Neutro Abs: 4.7 10*3/uL (ref 1.7–7.7)
Neutrophils Relative %: 62 %
Platelets: 361 10*3/uL (ref 150–400)
RBC: 4.24 MIL/uL (ref 3.87–5.11)
RDW: 16.1 % — ABNORMAL HIGH (ref 11.5–15.5)
WBC: 7.6 10*3/uL (ref 4.0–10.5)

## 2017-02-04 LAB — TSH: TSH: 5.67 u[IU]/mL — ABNORMAL HIGH (ref 0.350–4.500)

## 2017-02-04 LAB — VITAMIN B12: Vitamin B-12: 570 pg/mL (ref 180–914)

## 2017-02-04 NOTE — Progress Notes (Signed)
Location:   [facility] Room Number: 141/D Place of Service:  SNF (31) Provider:  [place]  Patient Care Team: [place] as PCP - General [facility])  Extended Emergency Contact Information Primary Emergency Contact: [name], [place] [address] [place] Phone: [phone] Relation: Friend Secondary Emergency Contact: Rueth,[name]  [place] Phone: [phone] Relation: Daughter  Code Status:  Full Code Goals of care: Advanced Directive information Advanced Directives 02/04/2017  Does Patient Have a Medical Advance Directive? Yes  Type of Advance Directive (No Data)  Does patient want to make changes to medical advance directive? No - Patient declined  Copy of [place] in Chart? -  Would patient like information on creating a medical advance directive? No - Patient declined     Chief Complaint  Patient presents with  . Acute Visit  For vaginal rash-hemorrhoids-elevated TSH  HPI:  Pt is a 83 y.o. female seen today for an acute visit for rash apparently in her perineal and vaginal area-there has been noted by staff.  Also apparently hemorrhoids been noted as.  Patient is a somewhat poor historian because of dementia but is not really complaining of any dysuria or anal discomfort.   .  Currently she is afebrile and does not really have any complaints continues to be somewhat confused however.  She was recently hospitalized with acute right abdominal pain ultrasound was positive for acute cholecystitis she was found to be a high risk for surgery so she had a Cholecystomy with drain inserted.  Plan was to continue this for a total of 5 weeks and reevaluate.    She was also found to have a UTI positive for Escherichia coli and she has been discharged on Ceftin which she will complete in 2 days.  Of  note on lab done today she did have a TSH drawn which actually is mildly elevated at 5.67--she does not have a diagnosis of hypothyroidism        Past Medical History:  Diagnosis Date  . Breast cancer ([place])   . Cognitive communication deficit   . Dementia   . Difficulty in walking(719.7)   . Fall   . Muscle weakness (generalized)   . Urinary tract infection, site not specified   . Vitamin B deficiency    Past Surgical History:  Procedure Laterality Date  . COLON SURGERY    . IR PERC CHOLECYSTOSTOMY  01/27/2017  . MASTECTOMY, PARTIAL Right     No Known Allergies  Outpatient Encounter Prescriptions as of 02/04/2017  Medication Sig  . AMBULATORY NON FORMULARY MEDICATION Take 120 mLs by mouth at bedtime. Magic Cup 154ml by mouth at bedtime to increase nutrition   . calcium carbonate (TUMS) 500 MG chewable tablet Chew 1 tablet by mouth daily.  . cefUROXime (CEFTIN) 500 MG tablet Take 1 tablet (500 mg total) by mouth 2 (two) times daily with a meal.  . Cholecalciferol 1000 units tablet Take 1,000 Units by mouth at bedtime.   . Cyanocobalamin (VITAMIN B-12) 1000 MCG SUBL Place 1 tablet under the tongue at bedtime.  . donepezil (ARICEPT) 10 MG tablet Take 1 tablet by mouth at bedtime.  . Sodium Chloride Flush (NORMAL SALINE FLUSH) 0.9 % SOLN Flush cholecystostomy tube daily with 5 cc  Once a day   No facility-administered encounter medications on file as of 02/04/2017.  Review of Systems   This is limited secondary to dementia but she is not really complaining of any abdominal discomfort although she still is somewhat irritated with the drain it appears-this does not appear to be in acute discomfort situation however.  Currently she is really denying any dysuria or burning of her perineal  area or anal discomfort  Immunization History  Administered Date(s) Administered  . Influenza-Unspecified 07/28/2014, 07/20/2016  . Pneumococcal-Unspecified 08/01/2016   Pertinent   Health Maintenance Due  Topic Date Due  . DEXA SCAN  04/18/2017 (Originally 08/25/1992)  . INFLUENZA VACCINE  05/22/2017  . PNA vac Low Risk Adult (2 of 2 - PCV13) 08/01/2017   No flowsheet data found. Functional Status Survey:    Vitals:   02/04/17 1555  BP: (!) 147/64  Pulse: 75  Resp: 20  Temp: 97.6 F (36.4 C)  TempSrc: Oral    Physical Exam   In general this is a fairly well-developed and well-nourished elderly female in no distress sitting in her wheelchair comfortably.  Her skin is warm and dry  Chest is clear to auscultation there is no labored breathing.  Heart is regular rate and rhythm without murmur gallop or rub.  Abdomen is soft does not appear to be acutely tender there are positive bowel sounds still has some tenderness around her drain site right side of the abdomen but I do not note any significant erythema firmness or sign of infection.  GU-she does appear to have a erythematous well-defined rash bilateral of her inner thighs perineal area-I do not note any drainage or vaginal discharge.   Rectal I do note some protruding hemorrhoid external of the anal area   Muscle skeletal is able to stand without assistance moves all her extremities at baseline.  Neurologic is grossly intact no lateralizing findings her speech is clear she is confused.  Psych is oriented to self follow simple verbal commands but her dementia appears to be gradually progressing she is pleasant and cooperative          Labs reviewed:  Recent Labs  01/29/17 0458 01/30/17 0504 02/04/17 0800  NA 145 144 142  K 3.5 3.1* 3.8  CL 115* 112* 103  CO2 23 24 27   GLUCOSE 87 90 105*  BUN 13 9 8   CREATININE 0.77 0.81 0.90  CALCIUM 8.2* 8.5* 9.1    Recent Labs  01/29/17 0458 01/30/17 0504 02/04/17 0800  AST 18 19 26   ALT 14 13* 29  ALKPHOS 48 50 60  BILITOT 0.7 0.6 0.3  PROT 6.2* 6.7 7.4  ALBUMIN 2.6* 2.9* 3.4*    Recent Labs  07/27/16 0715  01/26/17 1015   01/29/17 0458 01/30/17 0504 02/04/17 0800  WBC 6.6  < > 11.3*  < > 8.2 7.1 7.6  NEUTROABS 3.6  --  10.0*  --   --   --  4.7  HGB 12.6  < > 12.5  < > 10.5* 11.1* 12.8  HCT 39.2  < > 38.7  < > 33.1* 34.9* 39.8  MCV 93.8  < > 94.2  < > 94.0 93.6 93.9  PLT 238  < > 249  < > 212 236 361  < > = values in this interval not displayed. Lab Results  Component Value Date   TSH 5.670 (H) 02/04/2017   No results found for: HGBA1C No results found for: CHOL, HDL, LDLCALC, LDLDIRECT, TRIG, CHOLHDL  Significant Diagnostic Results in last 30 days:  US Abdomen Complete  Result Date:  01/26/2017 CLINICAL DATA:  Abdominal pain EXAM: ABDOMEN ULTRASOUND COMPLETE COMPARISON:  CT 01/26/2017 FINDINGS: Gallbladder: Gallstones are noted along the dependent wall with surrounding tumefactive biliary sludge. Intraluminal mass within the gallbladder is believed less likely given lack of vascularity. Gallbladder wall thickening up to 7 mm in thickness with trace pericholecystic fluid. Positive sonographic Murphy's. Common bile duct: Diameter: 6.3 mm.  No choledocholithiasis. Liver: No focal lesion identified. Mildly echogenic consistent fatty infiltration. No biliary dilatation. IVC: No abnormality visualized. Pancreas: Visualized portion unremarkable. The pancreas is largely obscured by overlying bowel gas however. Spleen: Size and appearance within normal limits. Right Kidney: Length: 7.4 cm. Echogenicity within normal limits. No mass or hydronephrosis visualized. Left Kidney: Length: 9.7 cm. Echogenicity within normal limits. No mass or hydronephrosis visualized. Abdominal aorta: No aneurysm visualized. Other findings: None. IMPRESSION: Cholelithiasis and tumefactive biliary sludge. This in conjunction with gallbladder wall thickening, positive sonographic Murphy's and mild pericholecystic fluid and edema is concerning for changes of acute cholecystitis. Electronically Signed   By: Ashley Royalty M.D.   On: 01/26/2017 21:24    Ct Abdomen Pelvis W Contrast  Result Date: 01/26/2017 CLINICAL DATA:  Abdominal pain. EXAM: CT ABDOMEN AND PELVIS WITH CONTRAST TECHNIQUE: Multidetector CT imaging of the abdomen and pelvis was performed using the standard protocol following bolus administration of intravenous contrast. CONTRAST:  151mL ISOVUE-300 IOPAMIDOL (ISOVUE-300) INJECTION 61% COMPARISON:  None. FINDINGS: Lower chest: Small hiatal hernia. Hepatobiliary: The liver and biliary tree are within normal. Gallbladder is mildly distended with wall thickening and cholelithiasis. Possible small amount of pericholecystic fluid. Pancreas: Within normal. Spleen: Within normal. Adrenals/Urinary Tract: Adrenal glands are normal. Right kidney is somewhat small as there is mild cortical scarring over the upper and lower pole right kidney. Left kidney within normal. No hydronephrosis or nephrolithiasis. Ureters and bladder are normal. Stomach/Bowel: Small hiatal hernia as the stomach is otherwise within normal. Small bowel is normal. Surgical suture line over the right colon. Appendix not visualized. Extensive diverticulosis throughout the colon most prominent over the sigmoid colon. Vascular/Lymphatic: Mild calcified plaque over the abdominal aorta and iliac arteries. Vascular structures otherwise within normal. No adenopathy. Reproductive: Within normal.  Pessary present over the midline. Other: Diastases of the rectus abdominus muscles at the level of the umbilicus. Multiple surgical clips over the right abdomen. Musculoskeletal: Degenerative change of the spine and hips. IMPRESSION: Dilatation of the gallbladder with wall thickening and cholelithiasis. Possible small amount of adjacent free fluid. Recommend clinical correlation and possible right upper quadrant ultrasound as findings may be due to acute cholecystitis. Extensive colonic diverticulosis.  Previous partial right colectomy. Somewhat small right kidney with areas of cortical scarring. Small  hiatal hernia. Diastases of the rectus abdominus muscles at the level of the umbilicus. Electronically Signed   By: Marin Olp M.D.   On: 01/26/2017 18:21   Ir Perc Cholecystostomy  Result Date: 01/27/2017 INDICATION: Acute calculus cholecystitis EXAM: ULTRASOUND AND FLUOROSCOPIC 10 FRENCH PERCUTANEOUS TRANSHEPATIC CHOLECYSTOSTOMY MEDICATIONS: ANCEF 2 G; The antibiotic was administered within an appropriate time frame prior to the initiation of the procedure. ANESTHESIA/SEDATION: Moderate (conscious) sedation was employed during this procedure. A total of Versed 1.0 mg and Fentanyl 50 mcg was administered intravenously. Moderate Sedation Time: 11 minutes. The patient's level of consciousness and vital signs were monitored continuously by radiology nursing throughout the procedure under my direct supervision. FLUOROSCOPY TIME:  Fluoroscopy Time:  12 seconds (3 mGy). COMPLICATIONS: None immediate. PROCEDURE: Informed written consent was obtained from the PATIENT'S DAUGHTER after a  thorough discussion of the procedural risks, benefits and alternatives. All questions were addressed. Maximal Sterile Barrier Technique was utilized including caps, mask, sterile gowns, sterile gloves, sterile drape, hand hygiene and skin antiseptic. A timeout was performed prior to the initiation of the procedure. Previous imaging reviewed. Preliminary ultrasound performed. Gallbladder was localized in the right mid abdomen. Transhepatic window was marked. Under sterile conditions and local anesthesia, a 21 gauge needle was advanced from a percutaneous transhepatic approach into the distended gallbladder. Needle position confirmed with ultrasound. Images obtained for documentation. Guidewire inserted followed by the Accustick dilator set. Amplatz guidewire exchange performed. Tract dilatation performed to insert a 10 [place] drain. Catheter formed in the gallbladder. Syringe aspiration yielded 65 cc headache GJ tube bile. Sample sent  for culture. Catheter secured with a Prolene suture and connected to external gravity drainage bag. Sterile dressing applied. No immediate complication. Patient tolerated the procedure well. IMPRESSION: Ultrasound and fluoroscopic 10 French percutaneous transhepatic cholecystostomy. Electronically Signed   By: Jerilynn Mages.  Shick M.D.   On: 01/27/2017 18:26   Dg Abd Portable 2v  Result Date: 01/28/2017 CLINICAL DATA:  83 year old female post percutaneous cholecystotomy 01/27/2017. Right abdominal pain. Subsequent encounter. EXAM: PORTABLE ABDOMEN - 2 VIEW COMPARISON:  01/27/2017. FINDINGS: Right sided percutaneous cholecystostomy tube is in place. No free intraperitoneal air or plain film evidence of bowel obstruction. Surgical clips right abdomen. IMPRESSION: Percutaneous cholecystostomy tube is in place. No free intraperitoneal air or plain film evidence of bowel obstruction. Electronically Signed   By: Genia Del M.D.   On: 01/28/2017 11:29    Assessment/Plan  #1-perineal rash-will treat with nystatin twice a day until resolution if no resolution notify provider.  #2 hemorrhoid Will treat with Anusol cream when necessary and monitor this.  #3 history of elevated TSH again this appears to be a new issue will order a free T4 next lab day to follow-up on this.  #4-history of hypokalemia this was supplemented and appears to have normalize with the potassium of 3.8 on lab done today Will update this next week to insure stability.  #5 history cholecystitis appears to be doing fairly well with the drain at this point will monitor she is not a be overtly symptomatic.  #6 history UTI again is completing course of antibiotics in 2 days  [place], [place], [place]

## 2017-02-05 ENCOUNTER — Encounter (HOSPITAL_COMMUNITY)
Admission: RE | Admit: 2017-02-05 | Discharge: 2017-02-05 | Disposition: A | Discharge status: Home / Self Care | Payer: No Typology Code available for payment source | Source: Skilled Nursing Facility | Attending: Internal Medicine | Admitting: Internal Medicine

## 2017-02-05 DIAGNOSIS — F0390 Unspecified dementia without behavioral disturbance: Secondary | ICD-10-CM | POA: Insufficient documentation

## 2017-02-05 DIAGNOSIS — Z9181 History of falling: Secondary | ICD-10-CM | POA: Insufficient documentation

## 2017-02-05 DIAGNOSIS — Z978 Presence of other specified devices: Secondary | ICD-10-CM | POA: Insufficient documentation

## 2017-02-05 DIAGNOSIS — E538 Deficiency of other specified B group vitamins: Secondary | ICD-10-CM | POA: Insufficient documentation

## 2017-02-06 ENCOUNTER — Encounter (HOSPITAL_COMMUNITY)
Admission: RE | Admit: 2017-02-06 | Discharge: 2017-02-06 | Disposition: A | Discharge status: Home / Self Care | Payer: Medicare Other | Source: Skilled Nursing Facility | Attending: Internal Medicine | Admitting: Internal Medicine

## 2017-02-06 DIAGNOSIS — Z9181 History of falling: Secondary | ICD-10-CM | POA: Insufficient documentation

## 2017-02-06 DIAGNOSIS — E538 Deficiency of other specified B group vitamins: Secondary | ICD-10-CM | POA: Diagnosis not present

## 2017-02-06 DIAGNOSIS — F0390 Unspecified dementia without behavioral disturbance: Secondary | ICD-10-CM | POA: Diagnosis not present

## 2017-02-06 DIAGNOSIS — Z978 Presence of other specified devices: Secondary | ICD-10-CM | POA: Insufficient documentation

## 2017-02-06 LAB — T4, FREE: Free T4: 0.97 ng/dL (ref 0.61–1.12)

## 2017-02-11 ENCOUNTER — Encounter (HOSPITAL_COMMUNITY)
Admission: RE | Admit: 2017-02-11 | Discharge: 2017-02-11 | Disposition: A | Discharge status: Home / Self Care | Payer: Medicare Other | Source: Skilled Nursing Facility | Attending: Internal Medicine | Admitting: Internal Medicine

## 2017-02-11 DIAGNOSIS — Z9181 History of falling: Secondary | ICD-10-CM | POA: Diagnosis not present

## 2017-02-11 DIAGNOSIS — Z978 Presence of other specified devices: Secondary | ICD-10-CM | POA: Insufficient documentation

## 2017-02-11 DIAGNOSIS — E538 Deficiency of other specified B group vitamins: Secondary | ICD-10-CM | POA: Diagnosis not present

## 2017-02-11 DIAGNOSIS — F0390 Unspecified dementia without behavioral disturbance: Secondary | ICD-10-CM | POA: Diagnosis present

## 2017-02-11 LAB — BASIC METABOLIC PANEL
Anion gap: 6 (ref 5–15)
BUN: 14 mg/dL (ref 6–20)
CO2: 29 mmol/L (ref 22–32)
Calcium: 8.6 mg/dL — ABNORMAL LOW (ref 8.9–10.3)
Chloride: 104 mmol/L (ref 101–111)
Creatinine, Ser: 0.84 mg/dL (ref 0.44–1.00)
GFR calc Af Amer: >60 mL/min (ref >60)
GFR calc non Af Amer: 60 mL/min — ABNORMAL LOW (ref >60)
Glucose, Bld: 94 mg/dL (ref 65–99)
Potassium: 4.2 mmol/L (ref 3.5–5.1)
Sodium: 139 mmol/L (ref 135–145)

## 2017-02-15 DIAGNOSIS — Z978 Presence of other specified devices: Secondary | ICD-10-CM | POA: Diagnosis not present

## 2017-02-15 DIAGNOSIS — R262 Difficulty in walking, not elsewhere classified: Secondary | ICD-10-CM | POA: Diagnosis not present

## 2017-02-15 DIAGNOSIS — R279 Unspecified lack of coordination: Secondary | ICD-10-CM | POA: Diagnosis not present

## 2017-02-15 DIAGNOSIS — Z9181 History of falling: Secondary | ICD-10-CM | POA: Diagnosis not present

## 2017-02-15 DIAGNOSIS — K810 Acute cholecystitis: Secondary | ICD-10-CM | POA: Diagnosis not present

## 2017-02-16 DIAGNOSIS — K810 Acute cholecystitis: Secondary | ICD-10-CM | POA: Diagnosis not present

## 2017-02-16 DIAGNOSIS — Z9181 History of falling: Secondary | ICD-10-CM | POA: Diagnosis not present

## 2017-02-16 DIAGNOSIS — R262 Difficulty in walking, not elsewhere classified: Secondary | ICD-10-CM | POA: Diagnosis not present

## 2017-02-16 DIAGNOSIS — R279 Unspecified lack of coordination: Secondary | ICD-10-CM | POA: Diagnosis not present

## 2017-02-16 DIAGNOSIS — Z978 Presence of other specified devices: Secondary | ICD-10-CM | POA: Diagnosis not present

## 2017-02-18 DIAGNOSIS — Z978 Presence of other specified devices: Secondary | ICD-10-CM | POA: Diagnosis not present

## 2017-02-18 DIAGNOSIS — R262 Difficulty in walking, not elsewhere classified: Secondary | ICD-10-CM | POA: Diagnosis not present

## 2017-02-18 DIAGNOSIS — K810 Acute cholecystitis: Secondary | ICD-10-CM | POA: Diagnosis not present

## 2017-02-18 DIAGNOSIS — R279 Unspecified lack of coordination: Secondary | ICD-10-CM | POA: Diagnosis not present

## 2017-02-18 DIAGNOSIS — Z9181 History of falling: Secondary | ICD-10-CM | POA: Diagnosis not present

## 2017-02-19 DIAGNOSIS — R279 Unspecified lack of coordination: Secondary | ICD-10-CM | POA: Diagnosis not present

## 2017-02-19 DIAGNOSIS — K810 Acute cholecystitis: Secondary | ICD-10-CM | POA: Diagnosis not present

## 2017-02-19 DIAGNOSIS — R262 Difficulty in walking, not elsewhere classified: Secondary | ICD-10-CM | POA: Diagnosis not present

## 2017-02-19 DIAGNOSIS — Z9181 History of falling: Secondary | ICD-10-CM | POA: Diagnosis not present

## 2017-02-19 DIAGNOSIS — Z978 Presence of other specified devices: Secondary | ICD-10-CM | POA: Diagnosis not present

## 2017-02-21 DIAGNOSIS — R279 Unspecified lack of coordination: Secondary | ICD-10-CM | POA: Diagnosis not present

## 2017-02-21 DIAGNOSIS — R262 Difficulty in walking, not elsewhere classified: Secondary | ICD-10-CM | POA: Diagnosis not present

## 2017-02-21 DIAGNOSIS — K810 Acute cholecystitis: Secondary | ICD-10-CM | POA: Diagnosis not present

## 2017-02-21 DIAGNOSIS — Z9181 History of falling: Secondary | ICD-10-CM | POA: Diagnosis not present

## 2017-02-21 DIAGNOSIS — Z978 Presence of other specified devices: Secondary | ICD-10-CM | POA: Diagnosis not present

## 2017-02-22 DIAGNOSIS — K810 Acute cholecystitis: Secondary | ICD-10-CM | POA: Diagnosis not present

## 2017-02-22 DIAGNOSIS — Z9181 History of falling: Secondary | ICD-10-CM | POA: Diagnosis not present

## 2017-02-22 DIAGNOSIS — R279 Unspecified lack of coordination: Secondary | ICD-10-CM | POA: Diagnosis not present

## 2017-02-22 DIAGNOSIS — Z978 Presence of other specified devices: Secondary | ICD-10-CM | POA: Diagnosis not present

## 2017-02-22 DIAGNOSIS — R262 Difficulty in walking, not elsewhere classified: Secondary | ICD-10-CM | POA: Diagnosis not present

## 2017-02-23 DIAGNOSIS — K810 Acute cholecystitis: Secondary | ICD-10-CM | POA: Diagnosis not present

## 2017-02-23 DIAGNOSIS — Z978 Presence of other specified devices: Secondary | ICD-10-CM | POA: Diagnosis not present

## 2017-02-23 DIAGNOSIS — R279 Unspecified lack of coordination: Secondary | ICD-10-CM | POA: Diagnosis not present

## 2017-02-23 DIAGNOSIS — R262 Difficulty in walking, not elsewhere classified: Secondary | ICD-10-CM | POA: Diagnosis not present

## 2017-02-23 DIAGNOSIS — Z9181 History of falling: Secondary | ICD-10-CM | POA: Diagnosis not present

## 2017-02-24 ENCOUNTER — Encounter: Payer: Self-pay | Admitting: Internal Medicine

## 2017-02-25 DIAGNOSIS — Z9181 History of falling: Secondary | ICD-10-CM | POA: Diagnosis not present

## 2017-02-25 DIAGNOSIS — R279 Unspecified lack of coordination: Secondary | ICD-10-CM | POA: Diagnosis not present

## 2017-02-25 DIAGNOSIS — R262 Difficulty in walking, not elsewhere classified: Secondary | ICD-10-CM | POA: Diagnosis not present

## 2017-02-25 DIAGNOSIS — Z978 Presence of other specified devices: Secondary | ICD-10-CM | POA: Diagnosis not present

## 2017-02-25 DIAGNOSIS — K810 Acute cholecystitis: Secondary | ICD-10-CM | POA: Diagnosis not present

## 2017-02-25 NOTE — Progress Notes (Signed)
This encounter was created in error - please disregard.

## 2017-02-26 DIAGNOSIS — Z9181 History of falling: Secondary | ICD-10-CM | POA: Diagnosis not present

## 2017-02-26 DIAGNOSIS — R262 Difficulty in walking, not elsewhere classified: Secondary | ICD-10-CM | POA: Diagnosis not present

## 2017-02-26 DIAGNOSIS — Z978 Presence of other specified devices: Secondary | ICD-10-CM | POA: Diagnosis not present

## 2017-02-26 DIAGNOSIS — K810 Acute cholecystitis: Secondary | ICD-10-CM | POA: Diagnosis not present

## 2017-02-26 DIAGNOSIS — R279 Unspecified lack of coordination: Secondary | ICD-10-CM | POA: Diagnosis not present

## 2017-02-27 DIAGNOSIS — K810 Acute cholecystitis: Secondary | ICD-10-CM | POA: Diagnosis not present

## 2017-02-27 DIAGNOSIS — R279 Unspecified lack of coordination: Secondary | ICD-10-CM | POA: Diagnosis not present

## 2017-02-27 DIAGNOSIS — Z978 Presence of other specified devices: Secondary | ICD-10-CM | POA: Diagnosis not present

## 2017-02-27 DIAGNOSIS — Z9181 History of falling: Secondary | ICD-10-CM | POA: Diagnosis not present

## 2017-02-27 DIAGNOSIS — R262 Difficulty in walking, not elsewhere classified: Secondary | ICD-10-CM | POA: Diagnosis not present

## 2017-02-28 DIAGNOSIS — Z9181 History of falling: Secondary | ICD-10-CM | POA: Diagnosis not present

## 2017-02-28 DIAGNOSIS — R279 Unspecified lack of coordination: Secondary | ICD-10-CM | POA: Diagnosis not present

## 2017-02-28 DIAGNOSIS — Z978 Presence of other specified devices: Secondary | ICD-10-CM | POA: Diagnosis not present

## 2017-02-28 DIAGNOSIS — R262 Difficulty in walking, not elsewhere classified: Secondary | ICD-10-CM | POA: Diagnosis not present

## 2017-02-28 DIAGNOSIS — K810 Acute cholecystitis: Secondary | ICD-10-CM | POA: Diagnosis not present

## 2017-03-01 DIAGNOSIS — R279 Unspecified lack of coordination: Secondary | ICD-10-CM | POA: Diagnosis not present

## 2017-03-01 DIAGNOSIS — Z978 Presence of other specified devices: Secondary | ICD-10-CM | POA: Diagnosis not present

## 2017-03-01 DIAGNOSIS — K810 Acute cholecystitis: Secondary | ICD-10-CM | POA: Diagnosis not present

## 2017-03-01 DIAGNOSIS — Z9181 History of falling: Secondary | ICD-10-CM | POA: Diagnosis not present

## 2017-03-01 DIAGNOSIS — R262 Difficulty in walking, not elsewhere classified: Secondary | ICD-10-CM | POA: Diagnosis not present

## 2017-03-04 DIAGNOSIS — R279 Unspecified lack of coordination: Secondary | ICD-10-CM | POA: Diagnosis not present

## 2017-03-04 DIAGNOSIS — R262 Difficulty in walking, not elsewhere classified: Secondary | ICD-10-CM | POA: Diagnosis not present

## 2017-03-04 DIAGNOSIS — Z978 Presence of other specified devices: Secondary | ICD-10-CM | POA: Diagnosis not present

## 2017-03-04 DIAGNOSIS — K810 Acute cholecystitis: Secondary | ICD-10-CM | POA: Diagnosis not present

## 2017-03-04 DIAGNOSIS — Z9181 History of falling: Secondary | ICD-10-CM | POA: Diagnosis not present

## 2017-03-05 ENCOUNTER — Encounter: Payer: Self-pay | Admitting: Internal Medicine

## 2017-03-05 ENCOUNTER — Ambulatory Visit
Admission: RE | Admit: 2017-03-05 | Discharge: 2017-03-05 | Disposition: A | Discharge status: Home / Self Care | Payer: Medicare Other | Source: Ambulatory Visit | Attending: General Surgery | Admitting: General Surgery

## 2017-03-05 ENCOUNTER — Non-Acute Institutional Stay (SKILLED_NURSING_FACILITY): Payer: Medicare Other | Admitting: Internal Medicine

## 2017-03-05 ENCOUNTER — Ambulatory Visit
Admission: RE | Admit: 2017-03-05 | Discharge: 2017-03-05 | Disposition: A | Discharge status: Home / Self Care | Payer: Medicare Other | Source: Ambulatory Visit | Attending: Internal Medicine | Admitting: Internal Medicine

## 2017-03-05 DIAGNOSIS — K8001 Calculus of gallbladder with acute cholecystitis with obstruction: Secondary | ICD-10-CM

## 2017-03-05 DIAGNOSIS — I10 Essential (primary) hypertension: Secondary | ICD-10-CM

## 2017-03-05 DIAGNOSIS — E876 Hypokalemia: Secondary | ICD-10-CM

## 2017-03-05 DIAGNOSIS — F0390 Unspecified dementia without behavioral disturbance: Secondary | ICD-10-CM | POA: Diagnosis not present

## 2017-03-05 DIAGNOSIS — Z434 Encounter for attention to other artificial openings of digestive tract: Secondary | ICD-10-CM | POA: Diagnosis not present

## 2017-03-05 DIAGNOSIS — S12191D Other nondisplaced fracture of second cervical vertebra, subsequent encounter for fracture with routine healing: Secondary | ICD-10-CM

## 2017-03-05 HISTORY — PX: IR RADIOLOGIST EVAL & MGMT: IMG5224

## 2017-03-05 NOTE — Progress Notes (Signed)
Location:   [place] [facility] [place] Room Number: 141/D Place of Service:  SNF (31) Provider:  [place], [facility]  Patient Care Team: [facility], [facility] as PCP - General (Family Medicine)  Extended Emergency Contact Information Primary Emergency Contact: [name], [place] [address] [place] Phone: [phone] Relation: Friend Secondary Emergency Contact: Crotteau,[name]  [place] Phone: [phone] Relation: Daughter  Code Status:  Full Code Goals of care: Advanced Directive information Advanced Directives 03/05/2017  Does Patient Have a Medical Advance Directive? Yes  Type of Advance Directive (No Data)  Does patient want to make changes to medical advance directive? No - Patient declined  Copy of [place] in Chart? -  Would patient like information on creating a medical advance directive? No - Patient declined     Chief Complaint  Patient presents with  . Medical Management of Chronic Issues    Routine Visit  For medical management of chronic medical issues including recent history of cholecystitis-hypertension-dementia-history of of cervical neck fracture with epidural hematoma  HPI:  Pt is a 83 y.o. female seen today for medical management of chronic diseases. As noted above.  she has been fairly remarkably stable for extended period of time however presently month ago she did have acute abdominal discomfort and was found in the hospital have cholecystitis with an elevated white count she also had a UTI-she was thought not to be a good surgical candidate so his stay she was treated with a cholecystotomy with drain which she continues with-she apparently has tolerated this well  In fact today she had radiology follow-up which showed the cystic duct remains occluded drain remains positioned in the lumen of the gallbladder-recommendation to  continue the drain in place and should be exchanged in three-month intervals.  She does have a history of dementia but appears to be relatively stable in this regards-she does well with supportive care she continues on Aricept.  She also has a history of hyporkalemia in the hospital but this has normalized potassium was 4.2 on lab done on April 23-  Currently she is sitting in her chair comfortably has no complaints which is her normal presentation.  Vital signs appear to be stable it appears she's lost about 8 pounds since beginning of the year suspect this may be due to her recent hospitalization         Past Medical History:  Diagnosis Date  . Breast cancer ([place])   . Cognitive communication deficit   . Dementia   . Difficulty in walking(719.7)   . Fall   . Muscle weakness (generalized)   . Urinary tract infection, site not specified   . Vitamin B deficiency    Past Surgical History:  Procedure Laterality Date  . COLON SURGERY    . IR PERC CHOLECYSTOSTOMY  01/27/2017  . MASTECTOMY, PARTIAL Right     No Known Allergies  Allergies as of 03/05/2017   No Known Allergies     Medication List       Accurate as of 03/05/17 12:52 PM. Always use your most recent med list.          acetaminophen 325 MG tablet Commonly known as:  TYLENOL Take 650 mg by mouth every 4 (four) hours as needed.   AMBULATORY NON FORMULARY MEDICATION Take 120 mLs by mouth at bedtime. Magic Cup 139ml by mouth at bedtime to increase  nutrition   Cholecalciferol 1000 units tablet Take 1,000 Units by mouth at bedtime.   donepezil 10 MG tablet Commonly known as:  ARICEPT Take 1 tablet by mouth at bedtime.   Normal Saline Flush 0.9 % Soln Flush cholecystostomy tube daily with 5 cc  Once a day   TUMS 500 MG chewable tablet Generic drug:  calcium carbonate Chew 1 tablet by mouth daily.   Vitamin B-12 1000 MCG Subl Place 1 tablet under the tongue at bedtime.       Review of Systems General  is not complaining of any fever or chillsHas no complaints today.  Skin is not complaining of any itching or rashes. Continues to have a drain right abdomen site  Head ears eyes nose mouth and throat does not complain of any visual changes continues with prescription lenses.  Resp--does not complain of any shortness breath or cough .  Cardiac no complaints of chest pain.Has some mild lower extremity edema which appears to be baseline  GI is not complaining of abdominal discomfort nausea vomiting diarrhea or constipation previous history of right colectomy in 2006 also recent cholecystitis status post drain  GU does not complain of any dysuria  Neurologic does not complain of any headaches dizziness or syncopal feelings or numbness does have a history of cervical spine fracture is noted above.  Psych some history of dementia again this is been assessed by Dr. Linna Darner thought to be mild she is now on B12 supplementation Continues to well with supportive care. Immunization History  Administered Date(s) Administered  . Influenza-Unspecified 07/28/2014, 07/20/2016  . Pneumococcal-Unspecified 08/01/2016   Pertinent  Health Maintenance Due  Topic Date Due  . DEXA SCAN  04/18/2017 (Originally 08/25/1992)  . INFLUENZA VACCINE  05/22/2017  . PNA vac Low Risk Adult (2 of 2 - PCV13) 08/01/2017   No flowsheet data found. Functional Status Survey:    Vitals:   03/03/17 1251  BP: 118/74  Pulse: 71  Resp: 20  Temp: 98.4 F (36.9 C)  TempSrc: Oral  Weight: 149 lb 6.4 oz (67.8 kg)  Height: 5\' 3"  (1.6 m)   Body mass index is 26.47 kg/m. Physical Exam  In general this is a pleasant elderly female in no distresssitting comfortably in her wheelchair  Her skin is warm and dry.  Eyes visual acuity appears grossly intact she has prescription lenses sclera and conjunctiva are clear.  Oropharynx clear mucous membranes moist.  Chest is clear to auscultation there is no  labored breathing.  Heart is regular rate and rhythm-without murmur gallop or rub she has  minimal lower extremity edema  Abdomen soft nontender positive bowel sounds with a well-healed surgical scar. She also has a drain placed right side abdomen and draining a moderate amount of green colored fluid  Muscle skeletal--she ambulates in wheelchair moves all extremities 4 with baseline strength  Neurologic is grossly intact her speech is clear.  Psych she is grossly alert and oriented at times has confusion but can carryon an appropriate conversation--she is pleasant and appropriate today    Labs reviewed:  Recent Labs  01/30/17 0504 02/04/17 0800 02/11/17 0700  NA 144 142 139  K 3.1* 3.8 4.2  CL 112* 103 104  CO2 24 27 29   GLUCOSE 90 105* 94  BUN 9 8 14   CREATININE 0.81 0.90 0.84  CALCIUM 8.5* 9.1 8.6*    Recent Labs  01/29/17 0458 01/30/17 0504 02/04/17 0800  AST 18 19 26   ALT 14 13* 29  ALKPHOS 48 50 60  BILITOT 0.7 0.6 0.3  PROT 6.2* 6.7 7.4  ALBUMIN 2.6* 2.9* 3.4*    Recent Labs  07/27/16 0715  01/26/17 1015  01/29/17 0458 01/30/17 0504 02/04/17 0800  WBC 6.6  < > 11.3*  < > 8.2 7.1 7.6  NEUTROABS 3.6  --  10.0*  --   --   --  4.7  HGB 12.6  < > 12.5  < > 10.5* 11.1* 12.8  HCT 39.2  < > 38.7  < > 33.1* 34.9* 39.8  MCV 93.8  < > 94.2  < > 94.0 93.6 93.9  PLT 238  < > 249  < > 212 236 361  < > = values in this interval not displayed. Lab Results  Component Value Date   TSH 5.670 (H) 02/04/2017   No results found for: HGBA1C No results found for: CHOL, HDL, LDLCALC, LDLDIRECT, TRIG, CHOLHDL  Significant Diagnostic Results in last 30 days:  No results found.  Assessment/Plan T #1 history of cholecystitis she appears to be doing well until use with a drain which actually was assessed by radiology today --per radiology note 01/28/2017 recommendation to keep drain in place at least 4-6 weeks unless cholecystectomy was done in the  interim- Impression atoday was continued cystic duct occlusion with recommendation drain should remain in place should be changed in 3 month intervals Continue to monitor she has lost a moderate amount away suspect this is due to her hospitalization this will have to be watched.  #2 dementia this appears stable with supportive care she is bright and alert today.  No behaviors have been noted by staff.  #3 hypertension currently on no medications nonetheless this appears to be stable recent blood pressures 118/74-111/56.  #4 history of hypokalemia in the hospital for her potassium was down 2-3 0.1 but has normalized it is 4.2 on lab done 02/11/2017 will update this to ensure stability.  #5 history of vitamin D deficiency this appears stable with a vitamin D level XL.8 on lab done in December 2018 will update this.  #6 history of vitamin B12 deficiency this appears to have normalize with a B12 level DLXX on lab done in April 2018.  #7 history of cervical neck fracture with epidural hematoma she did at one point have a cervical collar this has since been removed and she appears to be stable neurologically appears intact does not complain of any headaches appears medically to be at her baseline.  #8 history of mildly elevated TSH on lab done last month updated T4 was within normal range at 0.97 will monitor periodically.  #9 history of suspected osteoporosis she continues on calcium and this appears stable as well.  #10 history of right breast cancer 2008 status post modified mastectomy appears this was ductal in situ and patient did not want anything done aggressively     Again will update a BMP to keep eye on potassium as well as vitamin D level to see where she stands in that regard. Also will update a CBC with differential keep an eye on her white count although this appears to have normalized on lab done last month no fever no chills no complaints that would indicate a recurrent acute  process  [id number]-of note greater than 40 minutes spent assessing patient-discussing her status with nursing staff-reviewing her chart-reviewing her labs-and coordinating and formulating a plan of care for numerous diagnoses-of note greater than 50% of time spent coordinating plan of care

## 2017-03-05 NOTE — Progress Notes (Signed)
Referring Physician(s):  Dr. Tama High  Chief Complaint: The patient is seen in follow up today s/p gallstone cholecystitis with drain placement 01/27/17  History of present illness: Cynthia [name] is an 83 year old female with past medical history of dementia and gallstone cholecystitis who presents to IR clinic today for follow-up of a percutaneous cholecystostomy drain placement 01/27/17.  Patient resides in a nursing facility.  She was provided transportation here today but unfortunately daughter was unable to accompany her for the visit today due to an unforseen family emergency.  Patient herself is pleasantly demented.  She engages in conversation.  States she has no abdominal pain and has been eating and drinking. Weight appears stable. Nursing home records were provided by her facility and are reviewed.  Past Medical History:  Diagnosis Date  . Breast cancer ([place])   . Cognitive communication deficit   . Dementia   . Difficulty in walking(719.7)   . Fall   . Muscle weakness (generalized)   . Urinary tract infection, site not specified   . Vitamin B deficiency     Past Surgical History:  Procedure Laterality Date  . COLON SURGERY    . IR PERC CHOLECYSTOSTOMY  01/27/2017  . MASTECTOMY, PARTIAL Right     Allergies: Patient has no known allergies.  Medications: Prior to Admission medications   Medication Sig Start Date End Date Taking? Authorizing Provider  acetaminophen (TYLENOL) 325 MG tablet Take 650 mg by mouth every 4 (four) hours as needed.    [provider]  AMBULATORY NON FORMULARY MEDICATION Take 120 mLs by mouth at bedtime. Magic Cup 170ml by mouth at bedtime to increase nutrition     [provider]  calcium carbonate (TUMS) 500 MG chewable tablet Chew 1 tablet by mouth daily.    [provider]  Cholecalciferol 1000 units tablet Take 1,000 Units by mouth at bedtime.     [provider]  Cyanocobalamin (VITAMIN B-12) 1000 MCG  SUBL Place 1 tablet under the tongue at bedtime.    [provider]  donepezil (ARICEPT) 10 MG tablet Take 1 tablet by mouth at bedtime. 01/24/17   [provider]  Sodium Chloride Flush (NORMAL SALINE FLUSH) 0.9 % SOLN Flush cholecystostomy tube daily with 5 cc  Once a day    [provider]     Family History  Problem Relation Age of Onset  . Cancer Father        ? primary  . Diabetes Neg Hx   . Heart disease Neg Hx   . Stroke Neg Hx     Social History   Social History  . Marital status: Widowed    Spouse name: N/A  . Number of children: N/A  . Years of education: N/A   Social History Main Topics  . Smoking status: Never Smoker  . Smokeless tobacco: Never Used  . Alcohol use No  . Drug use: No  . Sexual activity: Not on file   Other Topics Concern  . Not on file   Social History Narrative  . No narrative on file     Vital Signs: BP (!) 143/52   Pulse 68   Temp 98 F (36.7 C) (Oral)   Physical Exam  NAD, alert, pleasantly confused Abd:  RUQ drain in place.  Stat lock secure. Brown, bilious output from drain.   Imaging: No results found.  Labs:  CBC:  Recent Labs  01/28/17 0454 01/29/17 0458 01/30/17 0504 02/04/17 0800  WBC 13.9* 8.2 7.1 7.6  HGB 11.4* 10.5* 11.1* 12.8  HCT 35.3* 33.1* 34.9* 39.8  PLT 218 212 236 361    COAGS:  Recent Labs  01/27/17 0948  INR 1.10    BMP:  Recent Labs  01/29/17 0458 01/30/17 0504 02/04/17 0800 02/11/17 0700  NA 145 144 142 139  K 3.5 3.1* 3.8 4.2  CL 115* 112* 103 104  CO2 23 24 27 29   GLUCOSE 87 90 105* 94  BUN 13 9 8 14   CALCIUM 8.2* 8.5* 9.1 8.6*  CREATININE 0.77 0.81 0.90 0.84  GFRNONAA >60 >60 55* 60*  GFRAA >60 >60 >60 >60    LIVER FUNCTION TESTS:  Recent Labs  01/28/17 0454 01/29/17 0458 01/30/17 0504 02/04/17 0800  BILITOT 0.8 0.7 0.6 0.3  AST 25 18 19 26   ALT 14 14 13* 29  ALKPHOS 52 48 50 60  PROT 6.4* 6.2* 6.7 7.4  ALBUMIN 2.9* 2.6* 2.9* 3.4*     Assessment: Cynthia [name] is an 82 year old female with past medical history of dementia and gallstone cholecystitis who presents to IR clinic today for follow-up of a percutaneous cholecystostomy placement 01/27/17.  Drain injection performed today and is dictated separately.  Reviewed case with Dr. Barbie Banner.  Drain is to remain in place.  Reviewed hospital records as patient is unable to provide additional follow-up information or surgical history.  Patient was seen by Dr. Tama High in consultation at the hospital.  She was determined to be at moderate risk for surgical intervention during hospitalization. Called [facility] to update on status of drain.  They have made a follow-up appointment for the patient on 03/12/17. [place] to update them on her new appointment with surgery. Will plan for drain exchange in 2 months if she continues to require her drain for her gallbladder obstruction.    Signed: Docia Barrier 03/05/2017, 2:14 PM   Please refer to Dr. Barbie Banner attestation of this note for management and plan.

## 2017-03-06 ENCOUNTER — Other Ambulatory Visit (HOSPITAL_COMMUNITY): Payer: Self-pay | Admitting: Interventional Radiology

## 2017-03-06 ENCOUNTER — Encounter (HOSPITAL_COMMUNITY)
Admission: RE | Admit: 2017-03-06 | Discharge: 2017-03-06 | Disposition: A | Discharge status: Home / Self Care | Payer: Medicare Other | Source: Skilled Nursing Facility | Attending: Internal Medicine | Admitting: Internal Medicine

## 2017-03-06 DIAGNOSIS — Z978 Presence of other specified devices: Secondary | ICD-10-CM | POA: Diagnosis not present

## 2017-03-06 DIAGNOSIS — F0390 Unspecified dementia without behavioral disturbance: Secondary | ICD-10-CM | POA: Diagnosis present

## 2017-03-06 DIAGNOSIS — R279 Unspecified lack of coordination: Secondary | ICD-10-CM | POA: Diagnosis not present

## 2017-03-06 DIAGNOSIS — Z9181 History of falling: Secondary | ICD-10-CM | POA: Diagnosis not present

## 2017-03-06 DIAGNOSIS — R262 Difficulty in walking, not elsewhere classified: Secondary | ICD-10-CM | POA: Diagnosis not present

## 2017-03-06 DIAGNOSIS — K819 Cholecystitis, unspecified: Secondary | ICD-10-CM

## 2017-03-06 DIAGNOSIS — K810 Acute cholecystitis: Secondary | ICD-10-CM | POA: Diagnosis not present

## 2017-03-06 LAB — CBC WITH DIFFERENTIAL/PLATELET
Basophils Absolute: 0 10*3/uL (ref 0.0–0.1)
Basophils Relative: 1 %
Eosinophils Absolute: 0.3 10*3/uL (ref 0.0–0.7)
Eosinophils Relative: 4 %
HCT: 40 % (ref 36.0–46.0)
Hemoglobin: 12.8 g/dL (ref 12.0–15.0)
Lymphocytes Relative: 27 %
Lymphs Abs: 2.1 10*3/uL (ref 0.7–4.0)
MCH: 29.8 pg (ref 26.0–34.0)
MCHC: 32 g/dL (ref 30.0–36.0)
MCV: 93.2 fL (ref 78.0–100.0)
Monocytes Absolute: 0.6 10*3/uL (ref 0.1–1.0)
Monocytes Relative: 8 %
Neutro Abs: 4.7 10*3/uL (ref 1.7–7.7)
Neutrophils Relative %: 60 %
Platelets: 283 10*3/uL (ref 150–400)
RBC: 4.29 MIL/uL (ref 3.87–5.11)
RDW: 16.1 % — ABNORMAL HIGH (ref 11.5–15.5)
WBC: 7.9 10*3/uL (ref 4.0–10.5)

## 2017-03-06 LAB — BASIC METABOLIC PANEL
Anion gap: 7 (ref 5–15)
BUN: 15 mg/dL (ref 6–20)
CO2: 29 mmol/L (ref 22–32)
Calcium: 9.1 mg/dL (ref 8.9–10.3)
Chloride: 103 mmol/L (ref 101–111)
Creatinine, Ser: 0.94 mg/dL (ref 0.44–1.00)
GFR calc Af Amer: >60 mL/min (ref >60)
GFR calc non Af Amer: 52 mL/min — ABNORMAL LOW (ref >60)
Glucose, Bld: 95 mg/dL (ref 65–99)
Potassium: 3.9 mmol/L (ref 3.5–5.1)
Sodium: 139 mmol/L (ref 135–145)

## 2017-03-07 DIAGNOSIS — Z978 Presence of other specified devices: Secondary | ICD-10-CM | POA: Diagnosis not present

## 2017-03-07 DIAGNOSIS — R279 Unspecified lack of coordination: Secondary | ICD-10-CM | POA: Diagnosis not present

## 2017-03-07 DIAGNOSIS — Z9181 History of falling: Secondary | ICD-10-CM | POA: Diagnosis not present

## 2017-03-07 DIAGNOSIS — K810 Acute cholecystitis: Secondary | ICD-10-CM | POA: Diagnosis not present

## 2017-03-07 DIAGNOSIS — R262 Difficulty in walking, not elsewhere classified: Secondary | ICD-10-CM | POA: Diagnosis not present

## 2017-03-07 LAB — VITAMIN D 25 HYDROXY (VIT D DEFICIENCY, FRACTURES): Vit D, 25-Hydroxy: 40.7 ng/mL (ref 30.0–100.0)

## 2017-03-08 DIAGNOSIS — K810 Acute cholecystitis: Secondary | ICD-10-CM | POA: Diagnosis not present

## 2017-03-08 DIAGNOSIS — Z9181 History of falling: Secondary | ICD-10-CM | POA: Diagnosis not present

## 2017-03-08 DIAGNOSIS — R279 Unspecified lack of coordination: Secondary | ICD-10-CM | POA: Diagnosis not present

## 2017-03-08 DIAGNOSIS — Z978 Presence of other specified devices: Secondary | ICD-10-CM | POA: Diagnosis not present

## 2017-03-08 DIAGNOSIS — R262 Difficulty in walking, not elsewhere classified: Secondary | ICD-10-CM | POA: Diagnosis not present

## 2017-03-08 NOTE — Progress Notes (Signed)
This encounter was created in error - please disregard.

## 2017-03-12 ENCOUNTER — Encounter: Payer: Self-pay | Admitting: General Surgery

## 2017-03-12 ENCOUNTER — Ambulatory Visit (INDEPENDENT_AMBULATORY_CARE_PROVIDER_SITE_OTHER): Payer: Medicare Other | Admitting: General Surgery

## 2017-03-12 VITALS — BP 149/64 | HR 76 | Temp 98.7°F | Resp 18 | Ht 63.0 in | Wt 148.0 lb

## 2017-03-12 DIAGNOSIS — K8020 Calculus of gallbladder without cholecystitis without obstruction: Secondary | ICD-10-CM | POA: Diagnosis not present

## 2017-03-12 NOTE — Progress Notes (Signed)
Subjective:     Cynthia [name]   patient is status post cholecystostomy tube placement April this year.  She was not deemed a surgical candidate for cholecystectomy.  She denies any abdominal pain, nausea, vomiting, or jaundice.  She just was seen by interventional radiology and was found to have ongoing impacted stone within the neck of the gallbladder.  I do not know how much drainage she has had from her cholecystostomy tube, though she states it is drained daily. Objective:    BP (!) 149/64   Pulse 76   Temp 98.7 F (37.1 C)   Resp 18   Ht 5\' 3"  (1.6 m)   Wt 148 lb (67.1 kg)   BMI 26.22 kg/m   General:  alert, cooperative and no distress    Abdomen is soft, nontender, nondistended.  Active bowel sounds appreciated.  Cholecystostomy tube is in place with bile noted within the bag.     Assessment:    Status post cholecystostomy tube placement, no complications at this time.    Plan:    Patient does not want surgical intervention.  She should have a follow-up appointment with interventional radiology in 4-6 weeks to assess patency and evaluate cholelithiasis.

## 2017-04-04 ENCOUNTER — Encounter: Payer: Self-pay | Admitting: Internal Medicine

## 2017-04-04 ENCOUNTER — Non-Acute Institutional Stay (SKILLED_NURSING_FACILITY): Payer: Medicare Other | Admitting: Internal Medicine

## 2017-04-04 DIAGNOSIS — N289 Disorder of kidney and ureter, unspecified: Secondary | ICD-10-CM

## 2017-04-04 DIAGNOSIS — Z434 Encounter for attention to other artificial openings of digestive tract: Secondary | ICD-10-CM

## 2017-04-04 DIAGNOSIS — F0390 Unspecified dementia without behavioral disturbance: Secondary | ICD-10-CM | POA: Diagnosis not present

## 2017-04-04 NOTE — Progress Notes (Signed)
Location:   [place] Room Number: 141/D Place of Service:  SNF (31) Provider:  Margarita Sermons, MD  Patient Care Team: Redmond School, MD as PCP - General (Internal Medicine)  Extended Emergency Contact Information Primary Emergency Contact: [name], [place] [address] [place] Phone: [phone] Relation: Friend Secondary Emergency Contact: Vaughan,[name]  [place] Phone: [phone] Relation: Daughter  Code Status:  Full Code Goals of care: Advanced Directive information Advanced Directives 04/04/2017  Does Patient Have a Medical Advance Directive? Yes  Type of Advance Directive (No Data)  Does patient want to make changes to medical advance directive? No - Patient declined  Copy of [place] in Chart? -  Would patient like information on creating a medical advance directive? No - Patient declined     Chief Complaint  Patient presents with  . Medical Management of Chronic Issues    Routine Visit    HPI:  Pt is a 83 y.o. female seen today for medical management of chronic diseases.    Patient has h/o  Breast cancer s/p Modified Mastectomy, S/P right hemi colectomy and past history of C2 fracture.  Patient was admitted in Hospital with Cholecystitis. Since she was considered high risk for surgery she had percutaneous cholecystostomy drain placement 01/27/17 by IR. She was recently seen for follow up with Surgery and IR and plan seems to be to continue the drain. They are planning to do drain exchange in 2 months if it continues to drain. Patient continues to be asymptomatic. Denies any pain, Fever or chills. Her appetite is good.Her weight is stable at 148 lbs. She is just upset about her drain and doesn't understand whyu she has to carry that bag around.  Past Medical History:  Diagnosis Date  . Breast cancer ([place])   . Cognitive communication  deficit   . Dementia   . Difficulty in walking(719.7)   . Fall   . Muscle weakness (generalized)   . Urinary tract infection, site not specified   . Vitamin B deficiency    Past Surgical History:  Procedure Laterality Date  . COLON SURGERY    . IR PERC CHOLECYSTOSTOMY  01/27/2017  . MASTECTOMY, PARTIAL Right     No Known Allergies  Outpatient Encounter Prescriptions as of 04/04/2017  Medication Sig  . acetaminophen (TYLENOL) 325 MG tablet Take 650 mg by mouth every 4 (four) hours as needed.  . AMBULATORY NON FORMULARY MEDICATION Take 120 mLs by mouth at bedtime. Magic Cup 136ml by mouth at bedtime to increase nutrition   . calcium carbonate (TUMS) 500 MG chewable tablet Chew 1 tablet by mouth daily.  . Cholecalciferol 1000 units tablet Take 1,000 Units by mouth at bedtime.   . Cyanocobalamin (VITAMIN B-12) 1000 MCG SUBL Place 1 tablet under the tongue at bedtime.  . donepezil (ARICEPT) 10 MG tablet Take 1 tablet by mouth at bedtime.  . Sodium Chloride Flush (NORMAL SALINE FLUSH) 0.9 % SOLN Flush cholecystostomy tube daily with 5 cc  Once a day   No facility-administered encounter medications on file as of 04/04/2017.      Review of Systems  Review of Systems  Constitutional: Negative for activity change, appetite change, chills, diaphoresis, fatigue and fever.  HENT: Negative for mouth sores, postnasal drip, rhinorrhea, sinus pain and sore throat.   Respiratory: Negative for apnea, cough, chest tightness, shortness of breath and wheezing.  Cardiovascular: Negative for chest pain, palpitations and leg swelling.  Gastrointestinal: Negative for abdominal distention, abdominal pain, constipation, diarrhea, nausea and vomiting.  Genitourinary: Negative for dysuria and frequency.  Musculoskeletal: Negative for arthralgias, joint swelling and myalgias.  Skin: Negative for rash.  Neurological: Negative for dizziness, syncope, weakness, light-headedness and numbness.    Psychiatric/Behavioral: Negative for behavioral problems, confusion and sleep disturbance.     Immunization History  Administered Date(s) Administered  . Influenza-Unspecified 07/28/2014, 07/20/2016  . Pneumococcal-Unspecified 08/01/2016   Pertinent  Health Maintenance Due  Topic Date Due  . DEXA SCAN  04/18/2017 (Originally 08/25/1992)  . INFLUENZA VACCINE  05/22/2017  . PNA vac Low Risk Adult (2 of 2 - PCV13) 08/01/2017   No flowsheet data found. Functional Status Survey:    Vitals:   04/04/17 0830  BP: 128/70  Pulse: 87  Resp: 20  Temp: 97.7 F (36.5 C)  TempSrc: Oral  SpO2: 96%   There is no height or weight on file to calculate BMI. Physical Exam   Constitutional: She appears well-developed and well-nourished.  HENT:  Head: Normocephalic.  Eyes: Pupils are equal, round, and reactive to light.  Neck: Neck supple.  Cardiovascular: Normal rate, regular rhythm and normal heart sounds.   Pulmonary/Chest: Effort normal and breath sounds normal. No respiratory distress. She has no wheezes.  Abdominal: Soft. Bowel sounds are normal. She exhibits no distension. There is no tenderness. There is no rebound. Drain is half full with fluid. Musculoskeletal: She exhibits no edema.  Neurological: She is alert.  Psychiatric: She has a normal mood and affect. Her behavior is normal. Thought content normal.  Labs reviewed:  Recent Labs  02/04/17 0800 02/11/17 0700 03/06/17 0700  NA 142 139 139  K 3.8 4.2 3.9  CL 103 104 103  CO2 27 29 29   GLUCOSE 105* 94 95  BUN 8 14 15   CREATININE 0.90 0.84 0.94  CALCIUM 9.1 8.6* 9.1    Recent Labs  01/29/17 0458 01/30/17 0504 02/04/17 0800  AST 18 19 26   ALT 14 13* 29  ALKPHOS 48 50 60  BILITOT 0.7 0.6 0.3  PROT 6.2* 6.7 7.4  ALBUMIN 2.6* 2.9* 3.4*    Recent Labs  01/26/17 1015  01/30/17 0504 02/04/17 0800 03/06/17 0700  WBC 11.3*  < > 7.1 7.6 7.9  NEUTROABS 10.0*  --   --  4.7 4.7  HGB 12.5  < > 11.1* 12.8 12.8   HCT 38.7  < > 34.9* 39.8 40.0  MCV 94.2  < > 93.6 93.9 93.2  PLT 249  < > 236 361 283  < > = values in this interval not displayed. Lab Results  Component Value Date   TSH 5.670 (H) 02/04/2017   No results found for: HGBA1C No results found for: CHOL, HDL, LDLCALC, LDLDIRECT, TRIG, CHOLHDL  Significant Diagnostic Results in last 30 days:  Dg Sinus/fist Tube Chk-non Gi  Result Date: 03/05/2017 INDICATION: Cholecystostomy tube placement EXAM: ABSCESS INJECTION MEDICATIONS: None ANESTHESIA/SEDATION: None FLUOROSCOPY TIME:  Fluoroscopy Time: 0 minutes 24 seconds (68 mGy). COMPLICATIONS: None immediate. PROCEDURE: Informed written consent was obtained from the patient after a thorough discussion of the procedural risks, benefits and alternatives. All questions were addressed. Maximal Sterile Barrier Technique was utilized including caps, mask, sterile gowns, sterile gloves, sterile drape, hand hygiene and skin antiseptic. A timeout was performed prior to the initiation of the procedure. Contrast was injected into the cholecystostomy tube and imaging was obtained. FINDINGS: The cystic duct remains occluded.  The drain remains positioned in the lumen of the gallbladder. IMPRESSION: Continued cystic duct occlusion. The drain should remain in place and a should be exchanged at the three-month interval. Electronically Signed   By: Marybelle Killings M.D.   On: 03/05/2017 14:14    Assessment/Plan S/P percutaneous cholecystostomy drain placement  Per radiology Cystic Duct stays occluded. Plan for continued Drain with possible exchange in 2 months. . Dementia Continue Aricept. Patient is stable and doing well. Continue supportive care. Hypertension Controlled with no meds. Increased TSH Will repeat TSH and Free T4 and T3  Family/ staff Communication:   Labs/tests ordered:   Hepatic Panel, BMP, CBC, TSH Free T4 And T3  Total time spent in this patient care encounter was 25_ minutes; greater than  50% of the visit spent counseling patient and coordinating care for problems addressed at this encounter.

## 2017-04-05 ENCOUNTER — Encounter (HOSPITAL_COMMUNITY)
Admission: RE | Admit: 2017-04-05 | Discharge: 2017-04-05 | Disposition: A | Discharge status: Home / Self Care | Payer: Medicare Other | Source: Skilled Nursing Facility | Attending: Internal Medicine | Admitting: Internal Medicine

## 2017-04-05 ENCOUNTER — Encounter: Payer: Self-pay | Admitting: General Surgery

## 2017-04-05 DIAGNOSIS — Z978 Presence of other specified devices: Secondary | ICD-10-CM | POA: Insufficient documentation

## 2017-04-05 DIAGNOSIS — K810 Acute cholecystitis: Secondary | ICD-10-CM | POA: Diagnosis not present

## 2017-04-05 DIAGNOSIS — F0390 Unspecified dementia without behavioral disturbance: Secondary | ICD-10-CM | POA: Insufficient documentation

## 2017-04-05 DIAGNOSIS — E538 Deficiency of other specified B group vitamins: Secondary | ICD-10-CM | POA: Diagnosis not present

## 2017-04-05 DIAGNOSIS — Z9181 History of falling: Secondary | ICD-10-CM | POA: Diagnosis not present

## 2017-04-05 LAB — BASIC METABOLIC PANEL
Anion gap: 7 (ref 5–15)
BUN: 14 mg/dL (ref 6–20)
CO2: 28 mmol/L (ref 22–32)
Calcium: 8.7 mg/dL — ABNORMAL LOW (ref 8.9–10.3)
Chloride: 105 mmol/L (ref 101–111)
Creatinine, Ser: 0.86 mg/dL (ref 0.44–1.00)
GFR calc Af Amer: >60 mL/min (ref >60)
GFR calc non Af Amer: 58 mL/min — ABNORMAL LOW (ref >60)
Glucose, Bld: 98 mg/dL (ref 65–99)
Potassium: 3.9 mmol/L (ref 3.5–5.1)
Sodium: 140 mmol/L (ref 135–145)

## 2017-04-05 LAB — CBC WITH DIFFERENTIAL/PLATELET
Basophils Absolute: 0 10*3/uL (ref 0.0–0.1)
Basophils Relative: 0 %
Eosinophils Absolute: 0.4 10*3/uL (ref 0.0–0.7)
Eosinophils Relative: 6 %
HCT: 36.6 % (ref 36.0–46.0)
Hemoglobin: 11.6 g/dL — ABNORMAL LOW (ref 12.0–15.0)
Lymphocytes Relative: 23 %
Lymphs Abs: 1.5 10*3/uL (ref 0.7–4.0)
MCH: 29.2 pg (ref 26.0–34.0)
MCHC: 31.7 g/dL (ref 30.0–36.0)
MCV: 92.2 fL (ref 78.0–100.0)
Monocytes Absolute: 0.5 10*3/uL (ref 0.1–1.0)
Monocytes Relative: 8 %
Neutro Abs: 4.3 10*3/uL (ref 1.7–7.7)
Neutrophils Relative %: 63 %
Platelets: 268 10*3/uL (ref 150–400)
RBC: 3.97 MIL/uL (ref 3.87–5.11)
RDW: 15.5 % (ref 11.5–15.5)
WBC: 6.7 10*3/uL (ref 4.0–10.5)

## 2017-04-05 LAB — HEPATIC FUNCTION PANEL
ALT: 18 U/L (ref 14–54)
AST: 15 U/L (ref 15–41)
Albumin: 3.1 g/dL — ABNORMAL LOW (ref 3.5–5.0)
Alkaline Phosphatase: 76 U/L (ref 38–126)
Bilirubin, Direct: <0.1 mg/dL — ABNORMAL LOW (ref 0.1–0.5)
Total Bilirubin: 0.4 mg/dL (ref 0.3–1.2)
Total Protein: 7 g/dL (ref 6.5–8.1)

## 2017-04-05 LAB — T4, FREE: Free T4: 0.84 ng/dL (ref 0.61–1.12)

## 2017-04-05 LAB — TSH: TSH: 2.726 u[IU]/mL (ref 0.350–4.500)

## 2017-04-06 LAB — T3, FREE: T3, Free: 2.1 pg/mL (ref 2.0–4.4)

## 2017-04-15 NOTE — Progress Notes (Signed)
This encounter was created in error - please disregard.

## 2017-05-03 ENCOUNTER — Non-Acute Institutional Stay (SKILLED_NURSING_FACILITY): Payer: Medicare Other | Admitting: Internal Medicine

## 2017-05-03 ENCOUNTER — Encounter: Payer: Self-pay | Admitting: Internal Medicine

## 2017-05-03 DIAGNOSIS — S12191D Other nondisplaced fracture of second cervical vertebra, subsequent encounter for fracture with routine healing: Secondary | ICD-10-CM

## 2017-05-03 DIAGNOSIS — E559 Vitamin D deficiency, unspecified: Secondary | ICD-10-CM

## 2017-05-03 DIAGNOSIS — F0390 Unspecified dementia without behavioral disturbance: Secondary | ICD-10-CM

## 2017-05-03 DIAGNOSIS — K8001 Calculus of gallbladder with acute cholecystitis with obstruction: Secondary | ICD-10-CM | POA: Diagnosis not present

## 2017-05-03 DIAGNOSIS — I10 Essential (primary) hypertension: Secondary | ICD-10-CM | POA: Diagnosis not present

## 2017-05-03 NOTE — Progress Notes (Signed)
Location:   [place] Room Number: 141/D Place of Service:  SNF (31) Provider:  Charleston Poot, MD  Patient Care Team: Redmond School, MD as PCP - General (Internal Medicine)  Extended Emergency Contact Information Primary Emergency Contact: [name], [place] [address] [place] Phone: [phone] Relation: Friend Secondary Emergency Contact: Cusic,[name]  [place] Phone: [phone] Relation: Daughter  Code Status:  Full Code Goals of care: Advanced Directive information Advanced Directives 05/03/2017  Does Patient Have a Medical Advance Directive? Yes  Type of Advance Directive (No Data)  Does patient want to make changes to medical advance directive? No - Patient declined  Copy of [place] in Chart? -  Would patient like information on creating a medical advance directive? No - Patient declined     Chief Complaint  Patient presents with  . Medical Management of Chronic Issues    Routine Visit   Short medical management of chronic medical conditions including dementia-osteoporosis-history of right hemicolectomy-history of C2 fracture-history of breast cancer status post modified mastectomy-recent history of cholecystitis status post percutaneous cholecystotomy drain.   HPI:  Pt is a 83 y.o. female seen today for medical management of chronic diseases.  As noted above-most recent issue was a recent hospitalization for cholecystitis she was thought to be at high risk for surgery and had a percutaneous cholecystotomy drain placed-plans to keep this in place for now and would drain exchange in about a month if it continues to drain she appears to have tolerated this well she is asymptomatic.  Her weight appears stable at 148 pounds she doesn't have any complaints today which is her baseline.     Past Medical History:  Diagnosis Date  . Breast  cancer ([place])   . Cognitive communication deficit   . Dementia   . Difficulty in walking(719.7)   . Fall   . Muscle weakness (generalized)   . Urinary tract infection, site not specified   . Vitamin B deficiency    Past Surgical History:  Procedure Laterality Date  . COLON SURGERY    . IR PERC CHOLECYSTOSTOMY  01/27/2017  . IR RADIOLOGIST EVAL & MGMT  03/05/2017  . MASTECTOMY, PARTIAL Right     No Known Allergies  Outpatient Encounter Prescriptions as of 05/03/2017  Medication Sig  . acetaminophen (TYLENOL) 325 MG tablet Take 650 mg by mouth every 4 (four) hours as needed.  . AMBULATORY NON FORMULARY MEDICATION Take 120 mLs by mouth at bedtime. Magic Cup 115ml by mouth at bedtime to increase nutrition   . calcium carbonate (TUMS) 500 MG chewable tablet Chew 1 tablet by mouth daily.  . Cholecalciferol 1000 units tablet Take 1,000 Units by mouth at bedtime.   . Cyanocobalamin (VITAMIN B-12) 1000 MCG SUBL Place 1 tablet under the tongue at bedtime.  . donepezil (ARICEPT) 10 MG tablet Take 1 tablet by mouth at bedtime.  . Sodium Chloride Flush (NORMAL SALINE FLUSH) 0.9 % SOLN Flush cholecystostomy tube daily with 5 cc  Once a day   No facility-administered encounter medications on file as of 05/03/2017.      Review of Systems   This is limited secondary to dementia  i In general does not complaining of any fever or chills   Respiratory denies cough or shortness of breath   Head ears eyes nose mouth and throat does not complaining of visual changes or sore  throat.  Cardiac does not complain of chest pain or edema.  GI does not complaining of any abdominal discomfort does have a drain placed as noted above does not complain of nausea vomiting appetite apparently stable weight is stable.  GU does not complain of dysuria.  Musculoskeletal does not complaining of joint pain   Neurologic does not complain of dizziness headache or syncope.  Psych does continue with history of  dementia is doing well with supportive care no behaviors noted         Immunization History  Administered Date(s) Administered  . Influenza-Unspecified 07/28/2014, 07/20/2016  . Pneumococcal-Unspecified 08/01/2016   Pertinent  Health Maintenance Due  Topic Date Due  . DEXA SCAN  08/25/1992  . INFLUENZA VACCINE  05/22/2017  . PNA vac Low Risk Adult (2 of 2 - PCV13) 08/01/2017   No flowsheet data found. Functional Status Survey:    Vitals:   04/28/17 1444  BP: 116/67  Pulse: 70  Resp: 20  Temp: (!) 96.2 F (35.7 C)  TempSrc: Oral  Weight: 148 lb (67.1 kg)  Height: 5\' 3"  (1.6 m)   Body mass index is 26.22 kg/m. Physical Exam  In general this is a pleasant elderly female in no distresssitting comfortably in her wheelchair  Her skin is warm and dry.  Eyes visual acuity appears grossly intact she has prescription lenses sclera and conjunctiva are clear.  Oropharynx clear mucous membranes moist.  Chest is clear to auscultation there is no labored breathing.  Heart is regular rate and rhythm-without murmur gallop or rub she has  trace lower extremity edema  Abdomen soft nontender positive bowel sounds with a well-healed surgical scar. She also has a drain placed right side abdomen and draining a small  amount of green colored fluid  Muscle skeletal--she ambulates in wheelchair moves all extremities 4 with baseline strength  Neurologic is grossly intact her speech is clear.  Psych she is grossly alert and oriented at times has confusion but can carryon an appropriate conversation--she is pleasant and appropriate    Labs reviewed:  Recent Labs  02/11/17 0700 03/06/17 0700 04/05/17 0720  NA 139 139 140  K 4.2 3.9 3.9  CL 104 103 105  CO2 29 29 28   GLUCOSE 94 95 98  BUN 14 15 14   CREATININE 0.84 0.94 0.86  CALCIUM 8.6* 9.1 8.7*    Recent Labs  01/30/17 0504 02/04/17 0800 04/05/17 0720  AST 19 26 15   ALT 13* 29 18  ALKPHOS 50  60 76  BILITOT 0.6 0.3 0.4  PROT 6.7 7.4 7.0  ALBUMIN 2.9* 3.4* 3.1*    Recent Labs  02/04/17 0800 03/06/17 0700 04/05/17 0720  WBC 7.6 7.9 6.7  NEUTROABS 4.7 4.7 4.3  HGB 12.8 12.8 11.6*  HCT 39.8 40.0 36.6  MCV 93.9 93.2 92.2  PLT 361 283 268   Lab Results  Component Value Date   TSH 2.726 04/05/2017   No results found for: HGBA1C No results found for: CHOL, HDL, LDLCALC, LDLDIRECT, TRIG, CHOLHDL  Significant Diagnostic Results in last 30 days:  No results found.  Assessment/Plan  #1 history of cholecystitis status post drain she appears to be stable in this regard she is followed by surgery as noted above plans are apparently to evaluate drain in about a month.  #2 history of dementia this appears stable on Aricept her weight is stable continues to do well with supportive care.  #3 history of hypertension this appears stable currently on no  medications recent blood pressures 116/67-124/56.  #4 history of vitamin D deficiency she is on supplementation appears stable most recent level was 40.7 back in May.  #5-history of osteoporosis she continues on calcium.  #6 history of elevated TSH this appears to have normalize labs done in June showed a TSH of 2.726-T4 was 0.64     #7 history of B12 deficiency this appears stabilized B12 level was 570 back in April.  8-history of cervical neck fracture with epidural hematoma-has been stable at one point had a cervical neck collar neurologically appears to be intact is not complaining of any headaches numbness or increased weakness from baseline   9 history of right breast cancer 2008 status post modified mastectomy-this was ductal in situ the patient did not want anything aggressive pursued   #10-history of anemia this appears stable with a hemoglobin of 11.6 on lab done in June.  [id number]    .

## 2017-05-06 ENCOUNTER — Ambulatory Visit (HOSPITAL_COMMUNITY): Admission: RE | Admit: 2017-05-06 | Payer: No Typology Code available for payment source | Source: Ambulatory Visit

## 2017-05-07 ENCOUNTER — Encounter (HOSPITAL_COMMUNITY): Payer: Self-pay | Admitting: Interventional Radiology

## 2017-05-07 ENCOUNTER — Ambulatory Visit (HOSPITAL_COMMUNITY)
Admit: 2017-05-07 | Discharge: 2017-05-07 | Disposition: A | Discharge status: Home / Self Care | Payer: Medicare Other | Source: Ambulatory Visit | Attending: Interventional Radiology | Admitting: Interventional Radiology

## 2017-05-07 DIAGNOSIS — K819 Cholecystitis, unspecified: Secondary | ICD-10-CM

## 2017-05-07 DIAGNOSIS — Z4803 Encounter for change or removal of drains: Secondary | ICD-10-CM | POA: Diagnosis not present

## 2017-05-07 DIAGNOSIS — Z435 Encounter for attention to cystostomy: Secondary | ICD-10-CM | POA: Diagnosis not present

## 2017-05-07 HISTORY — PX: IR CATHETER TUBE CHANGE: IMG717

## 2017-05-07 MED ORDER — LIDOCAINE HCL (PF) 1 % IJ SOLN
INTRAMUSCULAR | Status: DC | PRN
  Administered 2017-05-07: 5 mL

## 2017-05-07 MED ORDER — IOPAMIDOL (ISOVUE-300) INJECTION 61%
INTRAVENOUS | Status: AC
  Administered 2017-05-07: 8 mL
  Filled 2017-05-07: qty 50

## 2017-05-07 MED ORDER — LIDOCAINE HCL (PF) 1 % IJ SOLN
INTRAMUSCULAR | Status: AC
  Filled 2017-05-07: qty 30

## 2017-07-02 ENCOUNTER — Encounter (HOSPITAL_COMMUNITY): Payer: Self-pay | Admitting: Interventional Radiology

## 2017-07-02 ENCOUNTER — Ambulatory Visit (HOSPITAL_COMMUNITY)
Admit: 2017-07-02 | Discharge: 2017-07-02 | DRG: 950 | Disposition: A | Discharge status: Home / Self Care | Payer: Medicare Other | Source: Ambulatory Visit | Attending: Interventional Radiology | Admitting: Interventional Radiology

## 2017-07-02 DIAGNOSIS — Z4803 Encounter for change or removal of drains: Secondary | ICD-10-CM

## 2017-07-02 DIAGNOSIS — Z434 Encounter for attention to other artificial openings of digestive tract: Secondary | ICD-10-CM | POA: Diagnosis not present

## 2017-07-02 HISTORY — PX: IR EXCHANGE BILIARY DRAIN: IMG6046

## 2017-07-02 MED ORDER — LIDOCAINE HCL (PF) 1 % IJ SOLN
INTRAMUSCULAR | Status: AC
  Filled 2017-07-02: qty 30

## 2017-07-02 MED ORDER — IOPAMIDOL (ISOVUE-300) INJECTION 61%
INTRAVENOUS | Status: AC
  Administered 2017-07-02: 10 mL
  Filled 2017-07-02: qty 50

## 2017-07-02 NOTE — Procedures (Signed)
Interventional Radiology Procedure Note  Procedure: Routine perc chole drain exchange with new 10-F drain. To gravity.  Stat-lock.   Complications: None Recommendations:  - Ok to shower tomorrow - Do not submerge  - Routine care   Signed,  Dulcy Fanny. Earleen Newport, DO

## 2017-07-03 ENCOUNTER — Encounter (HOSPITAL_COMMUNITY): Payer: Self-pay

## 2017-07-10 ENCOUNTER — Ambulatory Visit (HOSPITAL_COMMUNITY)
Admit: 2017-07-10 | Discharge: 2017-07-10 | Disposition: A | Discharge status: Home / Self Care | Payer: Medicare Other | Source: Ambulatory Visit | Attending: Interventional Radiology | Admitting: Interventional Radiology

## 2017-07-10 ENCOUNTER — Encounter (HOSPITAL_COMMUNITY): Payer: Self-pay | Admitting: Diagnostic Radiology

## 2017-07-10 DIAGNOSIS — T85638A Leakage of other specified internal prosthetic devices, implants and grafts, initial encounter: Secondary | ICD-10-CM | POA: Insufficient documentation

## 2017-07-10 DIAGNOSIS — K8020 Calculus of gallbladder without cholecystitis without obstruction: Secondary | ICD-10-CM | POA: Insufficient documentation

## 2017-07-10 DIAGNOSIS — Y828 Other medical devices associated with adverse incidents: Secondary | ICD-10-CM | POA: Diagnosis not present

## 2017-07-10 DIAGNOSIS — Z434 Encounter for attention to other artificial openings of digestive tract: Secondary | ICD-10-CM | POA: Diagnosis not present

## 2017-07-10 HISTORY — PX: IR EXCHANGE BILIARY DRAIN: IMG6046

## 2017-07-10 MED ORDER — IOPAMIDOL (ISOVUE-300) INJECTION 61%
INTRAVENOUS | Status: AC
  Administered 2017-07-10: 20 mL
  Filled 2017-07-10: qty 50

## 2017-07-10 MED ORDER — LIDOCAINE HCL (PF) 1 % IJ SOLN
INTRAMUSCULAR | Status: DC | PRN
  Administered 2017-07-10: 5 mL

## 2017-07-10 MED ORDER — LIDOCAINE HCL (PF) 1 % IJ SOLN
INTRAMUSCULAR | Status: AC
  Filled 2017-07-10: qty 30

## 2017-07-10 NOTE — Procedures (Signed)
The cholecystostomy tube was completely dislodged.  New 10 Fr drain was successfully placed with fluoroscopy.  No blood loss and no immediate complication.  Plan for routine exchange in 8 weeks.

## 2017-07-11 ENCOUNTER — Non-Acute Institutional Stay (SKILLED_NURSING_FACILITY): Payer: Medicare Other

## 2017-07-11 DIAGNOSIS — Z0000 Encounter for general adult medical examination without abnormal findings: Secondary | ICD-10-CM

## 2017-07-11 NOTE — Patient Instructions (Signed)
[name] , Thank you for taking time to come for your Medicare Wellness Visit. I appreciate your ongoing commitment to your health goals. Please review the following plan we discussed and let me know if I can assist you in the future.   Screening recommendations/referrals: Colonoscopy excluded, pt over age 81 Mammogram excluded, pt over age 45 Bone Density up to date Recommended yearly ophthalmology/optometry visit for glaucoma screening and checkup Recommended yearly dental visit for hygiene and checkup  Vaccinations: Influenza vaccine due Pneumococcal vaccine 13 due 08/01/17 Tdap vaccine due Shingles vaccine not in records    Advanced directives: Need a copy of health care power of attorney and living will for chart  Conditions/risks identified: None  Next appointment: Dr. Lyndel Safe makes rounds   Preventive Care 65 Years and Older, Female Preventive care refers to lifestyle choices and visits with your health care provider that can promote health and wellness. What does preventive care include?  A yearly physical exam. This is also called an annual well check.  Dental exams once or twice a year.  Routine eye exams. Ask your health care provider how often you should have your eyes checked.  Personal lifestyle choices, including:  Daily care of your teeth and gums.  Regular physical activity.  Eating a healthy diet.  Avoiding tobacco and drug use.  Limiting alcohol use.  Practicing safe sex.  Taking low-dose aspirin every day.  Taking vitamin and mineral supplements as recommended by your health care provider. What happens during an annual well check? The services and screenings done by your health care provider during your annual well check will depend on your age, overall health, lifestyle risk factors, and family history of disease. Counseling  Your health care provider may ask you questions about your:  Alcohol use.  Tobacco use.  Drug use.  Emotional  well-being.  Home and relationship well-being.  Sexual activity.  Eating habits.  History of falls.  Memory and ability to understand (cognition).  Work and work Statistician.  Reproductive health. Screening  You may have the following tests or measurements:  Height, weight, and BMI.  Blood pressure.  Lipid and cholesterol levels. These may be checked every 5 years, or more frequently if you are over 40 years old.  Skin check.  Lung cancer screening. You may have this screening every year starting at age 70 if you have a 30-pack-year history of smoking and currently smoke or have quit within the past 15 years.  Fecal occult blood test (FOBT) of the stool. You may have this test every year starting at age 62.  Flexible sigmoidoscopy or colonoscopy. You may have a sigmoidoscopy every 5 years or a colonoscopy every 10 years starting at age 13.  Hepatitis C blood test.  Hepatitis B blood test.  Sexually transmitted disease (STD) testing.  Diabetes screening. This is done by checking your blood sugar (glucose) after you have not eaten for a while (fasting). You may have this done every 1-3 years.  Bone density scan. This is done to screen for osteoporosis. You may have this done starting at age 6.  Mammogram. This may be done every 1-2 years. Talk to your health care provider about how often you should have regular mammograms. Talk with your health care provider about your test results, treatment options, and if necessary, the need for more tests. Vaccines  Your health care provider may recommend certain vaccines, such as:  Influenza vaccine. This is recommended every year.  Tetanus, diphtheria, and acellular  pertussis (Tdap, Td) vaccine. You may need a Td booster every 10 years.  Zoster vaccine. You may need this after age 18.  Pneumococcal 13-valent conjugate (PCV13) vaccine. One dose is recommended after age 31.  Pneumococcal polysaccharide (PPSV23) vaccine. One  dose is recommended after age 58. Talk to your health care provider about which screenings and vaccines you need and how often you need them. This information is not intended to replace advice given to you by your health care provider. Make sure you discuss any questions you have with your health care provider. Document Released: 11/04/2015 Document Revised: 06/27/2016 Document Reviewed: 08/09/2015 Elsevier Interactive Patient Education  2017 [place] Prevention in the Home Falls can cause injuries. They can happen to people of all ages. There are many things you can do to make your home safe and to help prevent falls. What can I do on the outside of my home?  Regularly fix the edges of walkways and driveways and fix any cracks.  Remove anything that might make you trip as you walk through a door, such as a raised step or threshold.  Trim any bushes or trees on the path to your home.  Use bright outdoor lighting.  Clear any walking paths of anything that might make someone trip, such as rocks or tools.  Regularly check to see if handrails are loose or broken. Make sure that both sides of any steps have handrails.  Any raised decks and porches should have guardrails on the edges.  Have any leaves, snow, or ice cleared regularly.  Use sand or salt on walking paths during winter.  Clean up any spills in your garage right away. This includes oil or grease spills. What can I do in the bathroom?  Use night lights.  Install grab bars by the toilet and in the tub and shower. Do not use towel bars as grab bars.  Use non-skid mats or decals in the tub or shower.  If you need to sit down in the shower, use a plastic, non-slip stool.  Keep the floor dry. Clean up any water that spills on the floor as soon as it happens.  Remove soap buildup in the tub or shower regularly.  Attach bath mats securely with double-sided non-slip rug tape.  Do not have throw rugs and other  things on the floor that can make you trip. What can I do in the bedroom?  Use night lights.  Make sure that you have a light by your bed that is easy to reach.  Do not use any sheets or blankets that are too big for your bed. They should not hang down onto the floor.  Have a firm chair that has side arms. You can use this for support while you get dressed.  Do not have throw rugs and other things on the floor that can make you trip. What can I do in the kitchen?  Clean up any spills right away.  Avoid walking on wet floors.  Keep items that you use a lot in easy-to-reach places.  If you need to reach something above you, use a strong step stool that has a grab bar.  Keep electrical cords out of the way.  Do not use floor polish or wax that makes floors slippery. If you must use wax, use non-skid floor wax.  Do not have throw rugs and other things on the floor that can make you trip. What can I do with my stairs?  Do not leave any items on the stairs.  Make sure that there are handrails on both sides of the stairs and use them. Fix handrails that are broken or loose. Make sure that handrails are as long as the stairways.  Check any carpeting to make sure that it is firmly attached to the stairs. Fix any carpet that is loose or worn.  Avoid having throw rugs at the top or bottom of the stairs. If you do have throw rugs, attach them to the floor with carpet tape.  Make sure that you have a light switch at the top of the stairs and the bottom of the stairs. If you do not have them, ask someone to add them for you. What else can I do to help prevent falls?  Wear shoes that:  Do not have high heels.  Have rubber bottoms.  Are comfortable and fit you well.  Are closed at the toe. Do not wear sandals.  If you use a stepladder:  Make sure that it is fully opened. Do not climb a closed stepladder.  Make sure that both sides of the stepladder are locked into place.  Ask  someone to hold it for you, if possible.  Clearly mark and make sure that you can see:  Any grab bars or handrails.  First and last steps.  Where the edge of each step is.  Use tools that help you move around (mobility aids) if they are needed. These include:  Canes.  Walkers.  Scooters.  Crutches.  Turn on the lights when you go into a dark area. Replace any light bulbs as soon as they burn out.  Set up your furniture so you have a clear path. Avoid moving your furniture around.  If any of your floors are uneven, fix them.  If there are any pets around you, be aware of where they are.  Review your medicines with your doctor. Some medicines can make you feel dizzy. This can increase your chance of falling. Ask your doctor what other things that you can do to help prevent falls. This information is not intended to replace advice given to you by your health care provider. Make sure you discuss any questions you have with your health care provider. Document Released: 08/04/2009 Document Revised: 03/15/2016 Document Reviewed: 11/12/2014 Elsevier Interactive Patient Education  2017 [employer].

## 2017-07-11 NOTE — Progress Notes (Signed)
Subjective:   Cynthia [name] is a 83 y.o. female who presents for an Initial Medicare Annual Wellness Visit at [facility]       Objective:    Today's Vitals   07/11/17 1219  BP: 118/64  Pulse: 60  Temp: (!) 97.2 F (36.2 C)  TempSrc: Oral  SpO2: 92%  Weight: 148 lb (67.1 kg)  Height: 5\' 3"  (1.6 m)  PainSc: 6    Body mass index is 26.22 kg/m.   Current Medications (verified) Outpatient Encounter Prescriptions as of 07/11/2017  Medication Sig  . acetaminophen (TYLENOL) 325 MG tablet Take 650 mg by mouth every 4 (four) hours as needed.  . AMBULATORY NON FORMULARY MEDICATION Take 120 mLs by mouth at bedtime. Magic Cup 159ml by mouth at bedtime to increase nutrition   . calcium carbonate (TUMS) 500 MG chewable tablet Chew 1 tablet by mouth daily.  . Cholecalciferol 1000 units tablet Take 1,000 Units by mouth at bedtime.   . Cyanocobalamin (VITAMIN B-12) 1000 MCG SUBL Place 1 tablet under the tongue at bedtime.  . donepezil (ARICEPT) 10 MG tablet Take 1 tablet by mouth at bedtime.  . Sodium Chloride Flush (NORMAL SALINE FLUSH) 0.9 % SOLN Flush cholecystostomy tube daily with 5 cc  Once a day   No facility-administered encounter medications on file as of 07/11/2017.     Allergies (verified) Patient has no known allergies.   History: Past Medical History:  Diagnosis Date  . Breast cancer ([place])   . Cognitive communication deficit   . Dementia   . Difficulty in walking(719.7)   . Fall   . Muscle weakness (generalized)   . Urinary tract infection, site not specified   . Vitamin B deficiency    Past Surgical History:  Procedure Laterality Date  . COLON SURGERY    . IR CATHETER TUBE CHANGE  05/07/2017  . IR EXCHANGE BILIARY DRAIN  07/02/2017  . IR EXCHANGE BILIARY DRAIN  07/10/2017  . IR PERC CHOLECYSTOSTOMY  01/27/2017  . IR RADIOLOGIST EVAL & MGMT  03/05/2017  . MASTECTOMY, PARTIAL Right    Family History  Problem Relation Age of Onset  .  Cancer Father        ? primary  . Diabetes Neg Hx   . Heart disease Neg Hx   . Stroke Neg Hx    Social History   Occupational History  . Not on file.   Social History Main Topics  . Smoking status: Never Smoker  . Smokeless tobacco: Never Used  . Alcohol use No  . Drug use: No  . Sexual activity: Not on file    Tobacco Counseling Counseling given: Not Answered   Activities of Daily Living In your present state of health, do you have any difficulty performing the following activities: 07/11/2017 01/27/2017  Hearing? N N  Vision? N N  Difficulty concentrating or making decisions? Tempie Donning  Walking or climbing stairs? N N  Dressing or bathing? Y Y  Doing errands, shopping? Tempie Donning  Preparing Food and eating ? Y -  Using the Toilet? Y -  In the past six months, have you accidently leaked urine? N -  Do you have problems with loss of bowel control? N -  Managing your Medications? Y -  Managing your Finances? Y -  Housekeeping or managing your Housekeeping? Y -  Some recent data might be hidden    Immunizations and Health Maintenance Immunization History  Administered Date(s) Administered  .  Influenza-Unspecified 07/28/2014, 07/20/2016  . Pneumococcal-Unspecified 08/01/2016   There are no preventive care reminders to display for this patient.  Patient Care Team: [employer], MD as PCP - General (Internal Medicine)  Indicate any recent Medical Services you may have received from other than [facility] providers in the past year (date may be approximate).     Assessment:   This is a routine wellness examination for Cynthia [name].   Hearing/Vision screen No exam data present  Dietary issues and exercise activities discussed: Current Exercise Habits: The patient does not participate in regular exercise at present, Exercise limited by: orthopedic condition(s)  Goals    None     Depression Screen PHQ 2/9 Scores 07/11/2017  PHQ - 2 Score 2  PHQ- 9 Score 3    Fall Risk Fall Risk   07/11/2017  Falls in the past year? No    Cognitive Function:     6CIT Screen 07/11/2017  What Year? 4 points  What month? 3 points  What time? 0 points  Count back from 20 0 points  Months in reverse 0 points  Repeat phrase 10 points  Total Score 17    Screening Tests Health Maintenance  Topic Date Due  . INFLUENZA VACCINE  09/21/2017 (Originally 05/22/2017)  . TETANUS/TDAP  04/18/2026 (Originally 08/25/1944)  . PNA vac Low Risk Adult (2 of 2 - PCV13) 08/01/2017  . DEXA SCAN  Completed      Plan:    I have personally reviewed and addressed the Medicare Annual Wellness questionnaire and have noted the following in the patient's chart:  A. Medical and social history B. Use of alcohol, tobacco or illicit drugs  C. Current medications and supplements D. Functional ability and status E.  Nutritional status F.  Physical activity G. Advance directives H. List of other physicians I.  Hospitalizations, surgeries, and ER visits in previous 12 months J.  [place] to include hearing, vision, cognitive, depression L. Referrals and appointments - none  In addition, I have reviewed and discussed with patient certain preventive protocols, quality metrics, and best practice recommendations. A written personalized care plan for preventive services as well as general preventive health recommendations were provided to patient.  See attached scanned questionnaire for additional information.   Signed,   Rich Reining, RN Nurse Health Advisor   Quick Notes   Health Maintenance: TDAP and flu vaccine due.     Abnormal Screen: PHQ-9:3, 6 ICT-17     Patient Concerns: None     Nurse Concerns: None

## 2017-07-17 DIAGNOSIS — M79675 Pain in left toe(s): Secondary | ICD-10-CM | POA: Diagnosis not present

## 2017-07-17 DIAGNOSIS — B351 Tinea unguium: Secondary | ICD-10-CM | POA: Diagnosis not present

## 2017-07-17 DIAGNOSIS — M79674 Pain in right toe(s): Secondary | ICD-10-CM | POA: Diagnosis not present

## 2017-07-17 DIAGNOSIS — R262 Difficulty in walking, not elsewhere classified: Secondary | ICD-10-CM | POA: Diagnosis not present

## 2017-07-30 ENCOUNTER — Encounter: Payer: Self-pay | Admitting: Internal Medicine

## 2017-07-30 ENCOUNTER — Non-Acute Institutional Stay (SKILLED_NURSING_FACILITY): Payer: Medicare Other | Admitting: Internal Medicine

## 2017-07-30 DIAGNOSIS — Z434 Encounter for attention to other artificial openings of digestive tract: Secondary | ICD-10-CM

## 2017-07-30 DIAGNOSIS — F0390 Unspecified dementia without behavioral disturbance: Secondary | ICD-10-CM | POA: Diagnosis not present

## 2017-07-30 NOTE — Progress Notes (Signed)
Location:   [facility] Room Number: 141/D Place of Service:  SNF (31) Provider:  Margarita Sermons, MD  Patient Care Team: Redmond School, MD as PCP - General (Internal Medicine)  Extended Emergency Contact Information Primary Emergency Contact: [name], [place] [address] [place] Phone: [phone] Relation: Friend Secondary Emergency Contact: Steinke,[name]  [place] Phone: [phone] Relation: Daughter  Code Status:  Full Code  Goals of care: Advanced Directive information Advanced Directives 07/30/2017  Does Patient Have a Medical Advance Directive? Yes  Type of Advance Directive (No Data)  Does patient want to make changes to medical advance directive? No - Patient declined  Copy of [place] in Chart? -  Would patient like information on creating a medical advance directive? No - Patient declined     Chief Complaint  Patient presents with  . Medical Management of Chronic Issues    Routine Visit    HPI:  Pt is a 83 y.o. female seen today for medical management of chronic diseases.    Patient has h/o Breast cancer s/p Modified Mastectomy, S/P right hemi colectomy and past history of C2 fracture. Patient was diagnosed with Acute Cholecystitis in 04/18. Since she was considered high risk for surgery she had percutaneous cholecystostomy drain placement 01/27/17 by IR. Patient has her tube recently changed on 07/10/17. With plan to change it in  8 weeks.. She is stable in the facility. Her weight is 143 lbs. Which is slightly less then few months ago. But patient says she has good appetite. D/W Nurses and she has not had any new issues.   Past Medical History:  Diagnosis Date  . Breast cancer ([place])   . Cognitive communication deficit   . Dementia   . Difficulty in walking(719.7)   . Fall   . Muscle weakness (generalized)   . Urinary tract  infection, site not specified   . Vitamin B deficiency    Past Surgical History:  Procedure Laterality Date  . COLON SURGERY    . IR CATHETER TUBE CHANGE  05/07/2017  . IR EXCHANGE BILIARY DRAIN  07/02/2017  . IR EXCHANGE BILIARY DRAIN  07/10/2017  . IR PERC CHOLECYSTOSTOMY  01/27/2017  . IR RADIOLOGIST EVAL & MGMT  03/05/2017  . MASTECTOMY, PARTIAL Right     No Known Allergies  Outpatient Encounter Prescriptions as of 07/30/2017  Medication Sig  . acetaminophen (TYLENOL) 325 MG tablet Take 650 mg by mouth every 4 (four) hours as needed.  . AMBULATORY NON FORMULARY MEDICATION Take 120 mLs by mouth at bedtime. Magic Cup 141ml by mouth at bedtime to increase nutrition   . calcium carbonate (TUMS) 500 MG chewable tablet Chew 1 tablet by mouth daily.  . Cholecalciferol 1000 units tablet Take 1,000 Units by mouth at bedtime.   . Cyanocobalamin (VITAMIN B-12) 1000 MCG SUBL Place 1 tablet under the tongue at bedtime.  . donepezil (ARICEPT) 10 MG tablet Take 1 tablet by mouth at bedtime.  . Sodium Chloride Flush (NORMAL SALINE FLUSH) 0.9 % SOLN Flush cholecystostomy tube daily with 5 cc  Once a day   No facility-administered encounter medications on file as of 07/30/2017.      Review of Systems  Review of Systems  Constitutional: Negative for activity change, appetite change, chills, diaphoresis, fatigue and fever.  HENT: Negative for mouth sores, postnasal drip, rhinorrhea, sinus pain and sore throat.  Respiratory: Negative for apnea, cough, chest tightness, shortness of breath and wheezing.   Cardiovascular: Negative for chest pain, palpitations and leg swelling.  Gastrointestinal: Negative for abdominal distention, abdominal pain, constipation, diarrhea, nausea and vomiting.  Genitourinary: Negative for dysuria and frequency.  Musculoskeletal: Negative for arthralgias, joint swelling and myalgias.  Skin: Negative for rash.  Neurological: Negative for dizziness, syncope, weakness,  light-headedness and numbness.  Psychiatric/Behavioral: Negative for behavioral problems, confusion and sleep disturbance.     Immunization History  Administered Date(s) Administered  . Influenza-Unspecified 07/28/2014, 07/20/2016  . Pneumococcal-Unspecified 08/01/2016   Pertinent  Health Maintenance Due  Topic Date Due  . INFLUENZA VACCINE  09/21/2017 (Originally 05/22/2017)  . PNA vac Low Risk Adult (2 of 2 - PCV13) 08/01/2017  . DEXA SCAN  Completed   Fall Risk  07/11/2017  Falls in the past year? No   Functional Status Survey:    Vitals:   07/30/17 0913  BP: (!) 100/48  Pulse: 63  Resp: 18  Temp: 98 F (36.7 C)  TempSrc: Oral  SpO2: 96%  Weight: 143 lb 9.6 oz (65.1 kg)  Height: 5\' 3"  (1.6 m)   Body mass index is 25.44 kg/m. Physical Exam  Constitutional: She appears well-developed and well-nourished.  HENT:  Head: Normocephalic.  Mouth/Throat: Oropharynx is clear and moist.  Eyes: Pupils are equal, round, and reactive to light.  Neck: Neck supple.  Cardiovascular: Normal rate and normal heart sounds.   No murmur heard. Pulmonary/Chest: Effort normal and breath sounds normal. No respiratory distress. She has no wheezes. She has no rales.  Abdominal: Soft. Bowel sounds are normal. She exhibits no distension. There is no tenderness. There is no rebound.  Musculoskeletal:  Trace edema Bilateral  Neurological: She is alert.  Skin: Skin is warm and dry.  Psychiatric: She has a normal mood and affect. Her behavior is normal. Thought content normal.    Labs reviewed:  Recent Labs  02/11/17 0700 03/06/17 0700 04/05/17 0720  NA 139 139 140  K 4.2 3.9 3.9  CL 104 103 105  CO2 29 29 28   GLUCOSE 94 95 98  BUN 14 15 14   CREATININE 0.84 0.94 0.86  CALCIUM 8.6* 9.1 8.7*    Recent Labs  01/30/17 0504 02/04/17 0800 04/05/17 0720  AST 19 26 15   ALT 13* 29 18  ALKPHOS 50 60 76  BILITOT 0.6 0.3 0.4  PROT 6.7 7.4 7.0  ALBUMIN 2.9* 3.4* 3.1*    Recent  Labs  02/04/17 0800 03/06/17 0700 04/05/17 0720  WBC 7.6 7.9 6.7  NEUTROABS 4.7 4.7 4.3  HGB 12.8 12.8 11.6*  HCT 39.8 40.0 36.6  MCV 93.9 93.2 92.2  PLT 361 283 268   Lab Results  Component Value Date   TSH 2.726 04/05/2017   No results found for: HGBA1C No results found for: CHOL, HDL, LDLCALC, LDLDIRECT, TRIG, CHOLHDL  Significant Diagnostic Results in last 30 days:  Ir Exchange Biliary Drain  Result Date: 07/10/2017 INDICATION: 83 year old with history of cholecystitis and cholecystostomy tube. The tube was recently exchanged on 07/02/2017. The tube is leaking and scheduled for an injection and possible exchange. The tube was completely dislodged when the patient arrived at [facility]. Plan for a complete drain exchange. EXAM: EXCHANGE OF CHOLECYSTOSTOMY TUBE WITH FLUOROSCOPY MEDICATIONS: None ANESTHESIA/SEDATION: None FLUOROSCOPY TIME:  Fluoroscopy Time: 54 seconds, 21 mGy COMPLICATIONS: None immediate. PROCEDURE: Informed written consent was obtained from the patient after a thorough discussion of the procedural risks, benefits and alternatives.  All questions were addressed. Maximal Sterile Barrier Technique was utilized including caps, mask, sterile gowns, sterile gloves, sterile drape, hand hygiene and skin antiseptic. A timeout was performed prior to the initiation of the procedure. The old tube site was prepped and draped in sterile fashion. Skin was anesthetized with 1% lidocaine. 5 [place] Kumpe catheter was advanced through the old tract. Tube easily advanced into the abdomen with fluoroscopic guidance. Contrast injection confirmed placement in the gallbladder. Bentson wire was placed. The catheter was removed and exchanged for 10 [place] multipurpose drain. Drain was reconstituted in the gallbladder. Additional contrast was injected. Catheter was flushed at the end of the procedure. Catheter was sutured to the skin. Catheter was attached to a gravity bag. FINDINGS: The  cholecystostomy tube was completely dislodged. A new 10 French catheter was successfully advanced along the old track and placed within the gallbladder. There are gallstones. The cystic duct and common bile duct are patent. IMPRESSION: Successful replacement of the cholecystostomy tube with fluoroscopy. Cholelithiasis. Patient is not scheduled for surgical intervention. Plan for routine exchange in 8 weeks. Will need to discuss catheter removal or capping in the future. Electronically Signed   By: Markus Daft M.D.   On: 07/10/2017 14:36   Ir Exchange Biliary Drain  Result Date: 07/02/2017 INDICATION: 83 year old female with a history of prior percutaneous cholecystostomy performed 01/27/2017. She returns today for routine exchange. EXAM: IR CATHETER TUBE CHANGE MEDICATIONS: The patient is currently admitted to the hospital and receiving intravenous antibiotics. The antibiotics were administered within an appropriate time frame prior to the initiation of the procedure. ANESTHESIA/SEDATION: No sedation COMPLICATIONS: None PROCEDURE: Informed written consent was obtained from the patient after a thorough discussion of the procedural risks, benefits and alternatives. All questions were addressed. Maximal Sterile Barrier Technique was utilized A timeout was performed prior to the initiation of the procedure. Patient positioned supine position on the fluoroscopy table. Initial scout images were acquired. Contrast infused through the percutaneous cholecystostomy drain confirming location. Modified Seldinger technique was used to replace the indwelling drain with a new 10 French percutaneous cholecystostomy drain. Catheter was attached to gravity drainage and fastened to the skin with Stat Lock. IMPRESSION: Status post routine exchange of percutaneous cholecystostomy. Signed, Dulcy Fanny. Earleen Newport, DO [facility] PLAN: If the patient is not a surgical candidate for  interval cholecystectomy, routine exchange should be continued every 2-3 months. Electronically Signed   By: Corrie Mckusick D.O.   On: 07/02/2017 15:35    Assessment/Plan  S/P percutaneous cholecystostomy drain placement  Per radiology her cystic Duct is blocked and she will need Drain for long time aith exchange every 2 months. Dementia Continue on Aricept and supportive care.  Family/ staff Communication:   Labs/tests ordered:  CMP, CBC   Total time spent in this patient care encounter was 25_ minutes; greater than 50% of the visit spent counseling patient, reviewing records , Labs and coordinating care for problems addressed at this encounter.

## 2017-07-31 ENCOUNTER — Encounter (HOSPITAL_COMMUNITY)
Admission: RE | Admit: 2017-07-31 | Discharge: 2017-07-31 | Disposition: A | Discharge status: Home / Self Care | Payer: Medicare Other | Source: Skilled Nursing Facility | Attending: Internal Medicine | Admitting: Internal Medicine

## 2017-07-31 DIAGNOSIS — F0390 Unspecified dementia without behavioral disturbance: Secondary | ICD-10-CM | POA: Insufficient documentation

## 2017-07-31 DIAGNOSIS — Z978 Presence of other specified devices: Secondary | ICD-10-CM | POA: Diagnosis not present

## 2017-07-31 DIAGNOSIS — E538 Deficiency of other specified B group vitamins: Secondary | ICD-10-CM | POA: Diagnosis not present

## 2017-07-31 DIAGNOSIS — Z9181 History of falling: Secondary | ICD-10-CM | POA: Diagnosis not present

## 2017-07-31 LAB — COMPREHENSIVE METABOLIC PANEL
ALT: 21 U/L (ref 14–54)
AST: 20 U/L (ref 15–41)
Albumin: 3.4 g/dL — ABNORMAL LOW (ref 3.5–5.0)
Alkaline Phosphatase: 84 U/L (ref 38–126)
Anion gap: 9 (ref 5–15)
BUN: 19 mg/dL (ref 6–20)
CO2: 25 mmol/L (ref 22–32)
Calcium: 8.8 mg/dL — ABNORMAL LOW (ref 8.9–10.3)
Chloride: 107 mmol/L (ref 101–111)
Creatinine, Ser: 0.93 mg/dL (ref 0.44–1.00)
GFR calc Af Amer: >60 mL/min (ref >60)
GFR calc non Af Amer: 53 mL/min — ABNORMAL LOW (ref >60)
Glucose, Bld: 85 mg/dL (ref 65–99)
Potassium: 4.3 mmol/L (ref 3.5–5.1)
Sodium: 141 mmol/L (ref 135–145)
Total Bilirubin: 0.6 mg/dL (ref 0.3–1.2)
Total Protein: 7.1 g/dL (ref 6.5–8.1)

## 2017-07-31 LAB — CBC
HCT: 41 % (ref 36.0–46.0)
Hemoglobin: 13.1 g/dL (ref 12.0–15.0)
MCH: 29.8 pg (ref 26.0–34.0)
MCHC: 32 g/dL (ref 30.0–36.0)
MCV: 93.4 fL (ref 78.0–100.0)
Platelets: 276 10*3/uL (ref 150–400)
RBC: 4.39 MIL/uL (ref 3.87–5.11)
RDW: 16.3 % — ABNORMAL HIGH (ref 11.5–15.5)
WBC: 7.4 10*3/uL (ref 4.0–10.5)

## 2017-08-28 ENCOUNTER — Encounter (HOSPITAL_COMMUNITY): Payer: Self-pay | Admitting: Interventional Radiology

## 2017-08-28 ENCOUNTER — Ambulatory Visit (HOSPITAL_COMMUNITY)
Admit: 2017-08-28 | Discharge: 2017-08-28 | Disposition: A | Discharge status: Home / Self Care | Payer: Medicare Other | Source: Ambulatory Visit | Attending: Diagnostic Radiology | Admitting: Diagnostic Radiology

## 2017-08-28 ENCOUNTER — Encounter (HOSPITAL_COMMUNITY): Payer: Self-pay

## 2017-08-28 ENCOUNTER — Inpatient Hospital Stay (HOSPITAL_COMMUNITY): Admit: 2017-08-28 | Discharge: 2017-08-28 | Disposition: A | Discharge status: Home / Self Care | Payer: Medicare Other

## 2017-08-28 DIAGNOSIS — Z4682 Encounter for fitting and adjustment of non-vascular catheter: Secondary | ICD-10-CM | POA: Diagnosis not present

## 2017-08-28 DIAGNOSIS — Z434 Encounter for attention to other artificial openings of digestive tract: Secondary | ICD-10-CM | POA: Diagnosis not present

## 2017-08-28 HISTORY — PX: IR EXCHANGE BILIARY DRAIN: IMG6046

## 2017-08-28 MED ORDER — LIDOCAINE HCL (PF) 1 % IJ SOLN
INTRAMUSCULAR | Status: DC | PRN
  Administered 2017-08-28: 8 mL

## 2017-08-28 MED ORDER — IOPAMIDOL (ISOVUE-300) INJECTION 61%
INTRAVENOUS | Status: AC
  Administered 2017-08-28: 5 mL
  Filled 2017-08-28: qty 50

## 2017-08-28 MED ORDER — LIDOCAINE HCL 1 % IJ SOLN
INTRAMUSCULAR | Status: AC
  Filled 2017-08-28: qty 20

## 2017-08-28 NOTE — Procedures (Signed)
Routine 10 Fr cholecystostomy tube exchange EBL 0 Comp 0

## 2017-09-23 ENCOUNTER — Encounter: Payer: Self-pay | Admitting: Internal Medicine

## 2017-09-23 ENCOUNTER — Non-Acute Institutional Stay (SKILLED_NURSING_FACILITY): Payer: Medicare Other | Admitting: Internal Medicine

## 2017-09-23 DIAGNOSIS — I10 Essential (primary) hypertension: Secondary | ICD-10-CM

## 2017-09-23 DIAGNOSIS — F0390 Unspecified dementia without behavioral disturbance: Secondary | ICD-10-CM | POA: Diagnosis not present

## 2017-09-23 DIAGNOSIS — S12191D Other nondisplaced fracture of second cervical vertebra, subsequent encounter for fracture with routine healing: Secondary | ICD-10-CM | POA: Diagnosis not present

## 2017-09-23 DIAGNOSIS — K8001 Calculus of gallbladder with acute cholecystitis with obstruction: Secondary | ICD-10-CM | POA: Diagnosis not present

## 2017-09-23 NOTE — Progress Notes (Signed)
Location:   [facility] Room Number: 141/D Place of Service:  SNF (31) Provider:  Charleston Poot, MD  Patient Care Team: Redmond School, MD as PCP - General (Internal Medicine)  Extended Emergency Contact Information Primary Emergency Contact: [name], [place] [address] [place] Phone: [phone] Relation: Friend Secondary Emergency Contact: Hosek,[name]  [place] Phone: [phone] Relation: Daughter  Code Status:  Full Code Goals of care: Advanced Directive information Advanced Directives 09/23/2017  Does Patient Have a Medical Advance Directive? Yes  Type of Advance Directive (No Data)  Does patient want to make changes to medical advance directive? No - Patient declined  Copy of [place] in Chart? -  Would patient like information on creating a medical advance directive? No - Patient declined     Chief Complaint  Patient presents with  . Medical Management of Chronic Issues    Routine Visit   For medical management of chronic medical conditions includin    recent diagnosis of acute cholecystitis status post percutaneous cholecystotomy drain-dementia-history of breast cancer status post modified mastectomy- history of status post right hemicolectomy.-Also history of-C2 fracture  History of present illness.  Patient is a 83 year old female with the above diagnoses she continues to be quite stable.  Most recent acute issue was back in April when she had acute cholecystitis-she was considered a high risk for surgery and had a percutaneous cholecystotomy drain placed which is changed every 8 weeks-she has tolerated this well.  She is not complaining of any abdominal pain or weight appears to be relatively stabilized at 147 pounds  is she does not complain of any difficulty swallowing or decreased appetite.  Nursing does not report any issues she  does have a history of dementia but this appears to be doing well with supportive care.    .     Past Medical History:  Diagnosis Date  . Breast cancer ([place])   . Cognitive communication deficit   . Dementia   . Difficulty in walking(719.7)   . Fall   . Muscle weakness (generalized)   . Urinary tract infection, site not specified   . Vitamin B deficiency    Past Surgical History:  Procedure Laterality Date  . COLON SURGERY    . IR CATHETER TUBE CHANGE  05/07/2017  . IR EXCHANGE BILIARY DRAIN  07/02/2017  . IR EXCHANGE BILIARY DRAIN  07/10/2017  . IR EXCHANGE BILIARY DRAIN  08/28/2017  . IR PERC CHOLECYSTOSTOMY  01/27/2017  . IR RADIOLOGIST EVAL & MGMT  03/05/2017  . MASTECTOMY, PARTIAL Right     No Known Allergies  Outpatient Encounter Medications as of 09/23/2017  Medication Sig  . acetaminophen (TYLENOL) 325 MG tablet Take 650 mg by mouth every 4 (four) hours as needed.  . AMBULATORY NON FORMULARY MEDICATION Take 120 mLs by mouth at bedtime. Magic Cup 131ml by mouth at bedtime to increase nutrition   . calcium carbonate (TUMS) 500 MG chewable tablet Chew 1 tablet by mouth daily.  . Cholecalciferol 1000 units tablet Take 1,000 Units by mouth at bedtime.   . Cyanocobalamin (VITAMIN B-12) 1000 MCG SUBL Place 1 tablet under the tongue at bedtime.  . donepezil (ARICEPT) 10 MG tablet Take 1 tablet by mouth at bedtime.  . Sodium Chloride Flush (NORMAL SALINE FLUSH) 0.9 % SOLN Flush cholecystostomy tube daily with 5 cc  Once a day  No facility-administered encounter medications on file as of 09/23/2017.      Review of Systems   In general she is not complaining of any fever chills says she feels well says she does not even notice she has the drain tube anymore.  Skin does not complain of rashes or itching.  Head ears eyes nose mouth and throat she has prescription lenses does not complain of any visual changes or sore throat.  Respiratory denies shortness of breath or  cough.  Cardiac denies chest pain has some mild baseline lower extremity edema.  GI does not complain of any abdominal discomfort continue with somewhat of a protuberant abdomen not complaining of any nausea vomiting diarrhea constipation she does have the percutaneous tube but appears to be tolerating this well.  GU does not complain of dysuria.  Musculoskeletal continues to ambulate with a walker but facility is not complaining of joint pain.  Neurologic does not complain of dizziness headache or syncope.  And psych history of dementia but she does well with supportive care not complaining of overt anxiety or depression.    Immunization History  Administered Date(s) Administered  . Influenza-Unspecified 07/28/2014, 07/20/2016, 07/26/2017  . Pneumococcal Conjugate-13 06/21/2017  . Pneumococcal-Unspecified 08/01/2016  . Tdap 07/11/2017   Pertinent  Health Maintenance Due  Topic Date Due  . INFLUENZA VACCINE  Completed  . DEXA SCAN  Completed  . PNA vac Low Risk Adult  Completed   Fall Risk  07/11/2017  Falls in the past year? No   Functional Status Survey:    Vitals:   09/23/17 1347  BP: (!) 102/54  Pulse: 78  Resp: (!) 24  Temp: 98 F (36.7 C)  TempSrc: Oral  SpO2: 96%  Weight: 147 lb 9.6 oz (67 kg)  Height: 5\' 3"  (1.6 m)  Of note manual blood pressure today was 138/72 Body mass index is 26.15 kg/m. Physical Exam   In general this is a pleasant elderly female in no distress sitting comfortably in a wheelchair.  Her skin is warm and dry.  Eyes sclera and conjunctive are clear visual acuity appears grossly intact she has prescription lenses.  Oropharynx is clear mucous membranes moist.  Chest is clear to auscultation there is no labored breathing.  Heart is regular rate and rhythm without murmur gallop or rub she has trace lower extremity edema bilaterally which appears to be baseline.  Her abdomen is protuberant which is baseline she has a tube with a bag  on her lower right leg appears a bag was just recently emptied  Musculoskeletal moves all extremities x4 strength appears to be intact all 4 extremities-continues to ambulate about facility in a walker.  Neurologic is grossly intact no lateralizing findings her speech is clear cranial nerves appear grossly intact.  Psych she is oriented to self he is pleasant and appropriate --is able to  carry on a short conversation I would classify her dementia as moderate     Labs reviewed: Recent Labs    03/06/17 0700 04/05/17 0720 07/31/17 0700  NA 139 140 141  K 3.9 3.9 4.3  CL 103 105 107  CO2 29 28 25   GLUCOSE 95 98 85  BUN 15 14 19   CREATININE 0.94 0.86 0.93  CALCIUM 9.1 8.7* 8.8*   Recent Labs    02/04/17 0800 04/05/17 0720 07/31/17 0700  AST 26 15 20   ALT 29 18 21   ALKPHOS 60 76 84  BILITOT 0.3 0.4 0.6  PROT 7.4 7.0 7.1  ALBUMIN 3.4* 3.1* 3.4*   Recent Labs    02/04/17 0800 03/06/17 0700 04/05/17 0720 07/31/17 0700  WBC 7.6 7.9 6.7 7.4  NEUTROABS 4.7 4.7 4.3  --   HGB 12.8 12.8 11.6* 13.1  HCT 39.8 40.0 36.6 41.0  MCV 93.9 93.2 92.2 93.4  PLT 361 283 268 276   Lab Results  Component Value Date   TSH 2.726 04/05/2017   No results found for: HGBA1C No results found for: CHOL, HDL, LDLCALC, LDLDIRECT, TRIG, CHOLHDL  Significant Diagnostic Results in last 30 days:  Ir Exchange Biliary Drain  Result Date: 08/28/2017 INDICATION: Routine cholecystostomy tube exchange EXAM: CHOLECYSTOSTOMY TUBE EXCHANGE MEDICATIONS: None ANESTHESIA/SEDATION: None FLUOROSCOPY TIME:  Fluoroscopy Time:  minutes 18 seconds (1 mGy). COMPLICATIONS: None immediate. PROCEDURE: Informed written consent was obtained from the patient after a thorough discussion of the procedural risks, benefits and alternatives. All questions were addressed. Maximal Sterile Barrier Technique was utilized including caps, mask, sterile gowns, sterile gloves, sterile drape, hand hygiene and skin antiseptic. A  timeout was performed prior to the initiation of the procedure. The right flank cholecystostomy tube site was prepped and draped in a sterile fashion. 1% lidocaine was utilized for anesthesia. The existing drain was cut and exchanged over a a Bentson wire for a new 10 [place] drain. This was looped and string fixed in the gallbladder lumen. It was sewn to the skin within 0 Prolene knot. Contrast was injected. FINDINGS: Images demonstrate exchange of the 64 French cholecystostomy tube which is coiled in the lumen of the gallbladder. Cystic duct remains occluded. Filling defects in the gallbladder lumen are consistent with gallstones. IMPRESSION: Successful cholecystostomy tube exchange. This will require exchange in 2 months. Electronically Signed   By: Marybelle Killings M.D.   On: 08/28/2017 12:45    Assessment/Plan  - History of dementia-this appears stable with supportive care weight is stable she is on Aricept nursing has not noted any behaviors.  2.  History of acute cholecystitis she is status post tube as noted above she appears to be tolerating this well this is exchanged apparently every 8 weeks-.  She does not complain of any nausea vomiting abdominal discomfort.  3.  History of hypertension she is not on any medications this appears stable nonetheless I got 138/72 today I see previous reading of 102/54 at this point will monitor.  4.  History of vitamin D deficiency she is on supplementation lab was 40.7 back in May will recheck this  #5-history of B12 deficiency she is on supplementation will update this level as well.  6.  History of breast carcinoma- patient has declined any further workup.  7.  History of elevated TSH in the past 24 on most recent lab was normal at 0.85 TSH was mildly elevated over 5 this was done in June 2018 will update this as well.  8.  History of cervical neck fracture with epidural hematoma this is been stable for a considerable amount of time at one point had a  neck collar-does not complain of any headaches dizziness or weakness.  9.  History of anemia this actually shows improvement on lab done in October at 13.1 will monitor periodically.    [id number]:

## 2017-09-24 ENCOUNTER — Encounter (HOSPITAL_COMMUNITY)
Admission: RE | Admit: 2017-09-24 | Discharge: 2017-09-24 | Disposition: A | Discharge status: Home / Self Care | Payer: Medicare Other | Source: Skilled Nursing Facility | Attending: Internal Medicine | Admitting: Internal Medicine

## 2017-09-24 DIAGNOSIS — Z9181 History of falling: Secondary | ICD-10-CM | POA: Diagnosis not present

## 2017-09-24 DIAGNOSIS — F0390 Unspecified dementia without behavioral disturbance: Secondary | ICD-10-CM | POA: Diagnosis present

## 2017-09-24 DIAGNOSIS — E538 Deficiency of other specified B group vitamins: Secondary | ICD-10-CM | POA: Diagnosis not present

## 2017-09-24 DIAGNOSIS — Z978 Presence of other specified devices: Secondary | ICD-10-CM | POA: Insufficient documentation

## 2017-09-24 LAB — VITAMIN B12: Vitamin B-12: 872 pg/mL (ref 180–914)

## 2017-09-24 LAB — TSH: TSH: 4.747 u[IU]/mL — ABNORMAL HIGH (ref 0.350–4.500)

## 2017-09-25 LAB — VITAMIN D 25 HYDROXY (VIT D DEFICIENCY, FRACTURES): Vit D, 25-Hydroxy: 34.1 ng/mL (ref 30.0–100.0)

## 2017-10-05 ENCOUNTER — Other Ambulatory Visit: Payer: Self-pay

## 2017-10-05 ENCOUNTER — Encounter (HOSPITAL_COMMUNITY): Payer: Self-pay

## 2017-10-05 ENCOUNTER — Emergency Department (HOSPITAL_COMMUNITY)
Admission: EM | Admit: 2017-10-05 | Discharge: 2017-10-06 | Disposition: A | Discharge status: Home / Self Care | Payer: Medicare Other | Attending: Emergency Medicine | Admitting: Emergency Medicine

## 2017-10-05 ENCOUNTER — Emergency Department (HOSPITAL_COMMUNITY): Payer: Medicare Other

## 2017-10-05 DIAGNOSIS — Z853 Personal history of malignant neoplasm of breast: Secondary | ICD-10-CM | POA: Diagnosis not present

## 2017-10-05 DIAGNOSIS — G8918 Other acute postprocedural pain: Secondary | ICD-10-CM | POA: Diagnosis not present

## 2017-10-05 DIAGNOSIS — I10 Essential (primary) hypertension: Secondary | ICD-10-CM | POA: Diagnosis not present

## 2017-10-05 DIAGNOSIS — R109 Unspecified abdominal pain: Secondary | ICD-10-CM | POA: Diagnosis not present

## 2017-10-05 LAB — CBC WITH DIFFERENTIAL/PLATELET
Basophils Absolute: 0 10*3/uL (ref 0.0–0.1)
Basophils Relative: 0 %
Eosinophils Absolute: 0.3 10*3/uL (ref 0.0–0.7)
Eosinophils Relative: 5 %
HCT: 40.2 % (ref 36.0–46.0)
Hemoglobin: 12.9 g/dL (ref 12.0–15.0)
Lymphocytes Relative: 27 %
Lymphs Abs: 1.7 10*3/uL (ref 0.7–4.0)
MCH: 30.8 pg (ref 26.0–34.0)
MCHC: 32.1 g/dL (ref 30.0–36.0)
MCV: 95.9 fL (ref 78.0–100.0)
Monocytes Absolute: 0.5 10*3/uL (ref 0.1–1.0)
Monocytes Relative: 9 %
Neutro Abs: 3.8 10*3/uL (ref 1.7–7.7)
Neutrophils Relative %: 59 %
Platelets: 252 10*3/uL (ref 150–400)
RBC: 4.19 MIL/uL (ref 3.87–5.11)
RDW: 15.8 % — ABNORMAL HIGH (ref 11.5–15.5)
WBC: 6.4 10*3/uL (ref 4.0–10.5)

## 2017-10-05 LAB — COMPREHENSIVE METABOLIC PANEL
ALT: 19 U/L (ref 14–54)
AST: 19 U/L (ref 15–41)
Albumin: 3.6 g/dL (ref 3.5–5.0)
Alkaline Phosphatase: 84 U/L (ref 38–126)
Anion gap: 6 (ref 5–15)
BUN: 19 mg/dL (ref 6–20)
CO2: 27 mmol/L (ref 22–32)
Calcium: 9.1 mg/dL (ref 8.9–10.3)
Chloride: 108 mmol/L (ref 101–111)
Creatinine, Ser: 0.86 mg/dL (ref 0.44–1.00)
GFR calc Af Amer: >60 mL/min (ref >60)
GFR calc non Af Amer: 58 mL/min — ABNORMAL LOW (ref >60)
Glucose, Bld: 99 mg/dL (ref 65–99)
Potassium: 4.6 mmol/L (ref 3.5–5.1)
Sodium: 141 mmol/L (ref 135–145)
Total Bilirubin: 0.2 mg/dL — ABNORMAL LOW (ref 0.3–1.2)
Total Protein: 7.3 g/dL (ref 6.5–8.1)

## 2017-10-05 LAB — LIPASE, BLOOD: Lipase: 49 U/L (ref 11–51)

## 2017-10-05 MED ORDER — IOPAMIDOL (ISOVUE-300) INJECTION 61%
100.0000 mL | Freq: Once | INTRAVENOUS | Status: AC | PRN
  Administered 2017-10-05: 100 mL via INTRAVENOUS

## 2017-10-05 NOTE — ED Provider Notes (Signed)
[facility] EMERGENCY DEPARTMENT Provider Note   CSN: [id number] Arrival date & time: 10/05/17  1302   LEVEL 5 CAVEAT - DEMENTIA  History   Chief Complaint Chief Complaint  Patient presents with  . Post-op Problem    HPI Cynthia [name] is a 83 y.o. female.  HPI  83 year old female presents with change in color of the drainage from her biliary drain.  She has had this drain on and off for several months, most recently was placed on 11/7.  The patient has had some pain near that site as well.  Currently, the patient does not have any complaints while in the stretcher.  Further history is limited due to the patient's dementia.  Past Medical History:  Diagnosis Date  . Breast cancer ([place])   . Cognitive communication deficit   . Dementia   . Difficulty in walking(719.7)   . Fall   . Muscle weakness (generalized)   . Urinary tract infection, site not specified   . Vitamin B deficiency     Patient Active Problem List   Diagnosis Date Noted  . S/P Cholecystostomy 01/31/2017  . Cholecystitis, acute with cholelithiasis 01/26/2017  . HTN (hypertension) 08/26/2016  . Renal insufficiency, mild 03/15/2016  . UTI (urinary tract infection) 12/11/2013  . Breast cancer ([place]) 12/11/2013  . Benign neoplasm of colon 12/11/2013  . Dementia 12/11/2013  . C2 cervical fracture ([place]) 12/08/2013  . Syncope 12/08/2013    Past Surgical History:  Procedure Laterality Date  . COLON SURGERY    . IR CATHETER TUBE CHANGE  05/07/2017  . IR EXCHANGE BILIARY DRAIN  07/02/2017  . IR EXCHANGE BILIARY DRAIN  07/10/2017  . IR EXCHANGE BILIARY DRAIN  08/28/2017  . IR PERC CHOLECYSTOSTOMY  01/27/2017  . IR RADIOLOGIST EVAL & MGMT  03/05/2017  . MASTECTOMY, PARTIAL Right     OB History    No data available       Home Medications    Prior to Admission medications   Medication Sig Start Date End Date Taking? Authorizing Provider  acetaminophen (TYLENOL) 325 MG tablet Take 650 mg by mouth every 4  (four) hours as needed.   Yes [provider]  calcium carbonate (TUMS) 500 MG chewable tablet Chew 1 tablet by mouth daily.   Yes [provider]  Cholecalciferol 1000 units tablet Take 1,000 Units by mouth at bedtime.    Yes [provider]  Cyanocobalamin (VITAMIN B-12) 1000 MCG SUBL Place 1 tablet under the tongue at bedtime.   Yes [provider]  donepezil (ARICEPT) 10 MG tablet Take 1 tablet by mouth at bedtime. 01/24/17  Yes [provider]  Sodium Chloride Flush (NORMAL SALINE FLUSH) 0.9 % SOLN Flush cholecystostomy tube daily with 5 cc  Once a day   Yes [provider]    Family History Family History  Problem Relation Age of Onset  . Cancer Father        ? primary  . Diabetes Neg Hx   . Heart disease Neg Hx   . Stroke Neg Hx     Social History Social History   Tobacco Use  . Smoking status: Never Smoker  . Smokeless tobacco: Never Used  Substance Use Topics  . Alcohol use: No  . Drug use: No     Allergies   Patient has no known allergies.   Review of Systems Review of Systems  Unable to perform ROS: Dementia     Physical Exam Updated Vital Signs  BP 114/63   Pulse 65   Temp 98.4 F (36.9 C) (Oral)   Resp 17   Ht 5\' 3"  (1.6 m)   Wt 66.7 kg (147 lb)   SpO2 95%   BMI 26.04 kg/m   Physical Exam  Constitutional: She appears well-developed and well-nourished.  HENT:  Head: Normocephalic and atraumatic.  Right Ear: External ear normal.  Left Ear: External ear normal.  Nose: Nose normal.  Eyes: Right eye exhibits no discharge. Left eye exhibits no discharge.  Cardiovascular: Normal rate, regular rhythm and normal heart sounds.  Pulmonary/Chest: Effort normal and breath sounds normal.  Abdominal: Soft. There is no tenderness.  Drain in RUQ. Has an orange color in bag. No focal tenderness  Neurological: She is alert.  Skin: Skin is warm and dry.  Nursing note and vitals reviewed.    ED  Treatments / Results  Labs (all labs ordered are listed, but only abnormal results are displayed) Labs Reviewed  COMPREHENSIVE METABOLIC PANEL - Abnormal; Notable for the following components:      Result Value   Total Bilirubin 0.2 (*)    GFR calc non Af Amer 58 (*)    All other components within normal limits  CBC WITH DIFFERENTIAL/PLATELET - Abnormal; Notable for the following components:   RDW 15.8 (*)    All other components within normal limits  LIPASE, BLOOD    EKG  EKG Interpretation None       Radiology Ct Abdomen Pelvis W Contrast  Result Date: 10/05/2017 CLINICAL DATA:  Abdominal pain EXAM: CT ABDOMEN AND PELVIS WITH CONTRAST TECHNIQUE: Multidetector CT imaging of the abdomen and pelvis was performed using the standard protocol following bolus administration of intravenous contrast. CONTRAST:  195mL ISOVUE-300 IOPAMIDOL (ISOVUE-300) INJECTION 61% COMPARISON:  01/26/2017 FINDINGS: Lower chest: No acute abnormality. Hepatobiliary: No focal liver abnormality. There is a percutaneous transhepatic cholecystostomy tube in place with decompression of the gallbladder. Stones within the lumen of the gallbladder measure up to 6 mm. No biliary dilatation. Pancreas: Unremarkable. No pancreatic ductal dilatation or surrounding inflammatory changes. Spleen: Normal in size without focal abnormality. Adrenals/Urinary Tract: Normal appearance of the adrenal glands. The left kidney appears normal. Right renal scarring. No kidney mass or hydronephrosis identified. Urinary bladder appears normal. Stomach/Bowel: The stomach is normal. The small bowel loops have a normal course and caliber. No bowel obstruction. Numerous colonic diverticula identified without acute inflammation. Vascular/Lymphatic: Aortic atherosclerosis. No aneurysm. No upper abdominal adenopathy. No pelvic or inguinal adenopathy. Reproductive: Uterus and adnexal structures are unremarkable. Pessary identified. Other: Ventral  abdominal wall laxity is identified. No abdominal wall hernia or abnormality. No abdominopelvic ascites. Musculoskeletal: Degenerative disc disease noted. IMPRESSION: 1. No acute findings identified within the abdomen or pelvis. 2. Percutaneous cholecystostomy tube appears to be in appropriate position with decompressed gallbladder containing stones. 3. No pericholecystic fluid collections or biliary dilatation identified. 4.  Aortic Atherosclerosis (ICD10-I70.0). Electronically Signed   By: Kerby Moors M.D.   On: 10/05/2017 17:21    Procedures Procedures (including critical care time)  Medications Ordered in ED Medications  iopamidol (ISOVUE-300) 61 % injection 100 mL (100 mLs Intravenous Contrast Given 10/05/17 1656)     Initial Impression / Assessment and Plan / ED Course  I have reviewed the triage vital signs and the nursing notes.  Pertinent labs & imaging results that were available during my care of the patient were reviewed by me and considered in my medical decision making (see chart for details).  No clear findings on workup to suggest why the color in her percutaneous tube is different.  However it appears to still be draining and her WBC is normal and CT is unremarkable showing no clear cause for abdominal pain.  There is no signs of a recurrent acute infection.  She appears stable for discharge to follow-up with her PCP and interventional radiologist.  Final Clinical Impressions(s) / ED Diagnoses   Final diagnoses:  Post-op pain    ED Discharge Orders    None       Sherwood Gambler, MD 10/05/17 [phone]

## 2017-10-05 NOTE — ED Notes (Signed)
Patient ambulatory to restroom assisted by Probation officer. Patient tolerated well.

## 2017-10-05 NOTE — ED Triage Notes (Signed)
Patient here from [facility] sent by PA. Patient recently had cholecystectomy, now having change in color of fluid in drain and pain to surgery site.  No fevers noted.

## 2017-10-05 NOTE — ED Notes (Signed)
Awaiting [facility] to come get pt  

## 2017-10-06 ENCOUNTER — Inpatient Hospital Stay
Admission: RE | Admit: 2017-10-06 | Discharge: 2017-10-09 | Disposition: A | Discharge status: Nursing Home (Includes ICF) | Payer: Medicare Other | Source: Ambulatory Visit | Attending: Internal Medicine | Admitting: Internal Medicine

## 2017-10-07 ENCOUNTER — Non-Acute Institutional Stay (SKILLED_NURSING_FACILITY): Payer: Medicare Other | Admitting: Internal Medicine

## 2017-10-07 ENCOUNTER — Telehealth: Payer: Self-pay

## 2017-10-07 ENCOUNTER — Encounter: Payer: Self-pay | Admitting: Internal Medicine

## 2017-10-07 DIAGNOSIS — F0390 Unspecified dementia without behavioral disturbance: Secondary | ICD-10-CM

## 2017-10-07 DIAGNOSIS — Z434 Encounter for attention to other artificial openings of digestive tract: Secondary | ICD-10-CM

## 2017-10-07 NOTE — Progress Notes (Deleted)
Location:   [place] Room Number: 141/D Place of Service:  SNF (31) Provider:  Clydene Fake, MD  Patient Care Team: Virgie Dad, MD as PCP - General (Internal Medicine) Rolm Baptise as Physician Assistant (Internal Medicine)  Extended Emergency Contact Information Primary Emergency Contact: [name], [place] [address] [place] Phone: [phone] Relation: Friend Secondary Emergency Contact: W[place]eler,[name]  [place] Phone: [phone] Relation: Daughter  Code Status:  Full Code Goals of care: Advanced Directive information Advanced Directives 10/07/2017  Does Patient Have a Medical Advance Directive? Yes  Type of Advance Directive (No Data)  Does patient want to make changes to medical advance directive? No - Patient declined  Copy of [place] in Chart? -  Would patient like information on creating a medical advance directive? No - Patient declined     Chief Complaint  Patient presents with  . Acute Visit    F/U ED Visit    HPI:  Pt is a 83 y.o. female seen today for an acute visit for    Past Medical History:  Diagnosis Date  . Breast cancer ([place])   . Cognitive communication deficit   . Dementia   . Difficulty in walking(719.7)   . Fall   . Muscle weakness (generalized)   . Urinary tract infection, site not specified   . Vitamin B deficiency    Past Surgical History:  Procedure Laterality Date  . COLON SURGERY    . IR CATHETER TUBE CHANGE  05/07/2017  . IR EXCHANGE BILIARY DRAIN  07/02/2017  . IR EXCHANGE BILIARY DRAIN  07/10/2017  . IR EXCHANGE BILIARY DRAIN  08/28/2017  . IR PERC CHOLECYSTOSTOMY  01/27/2017  . IR RADIOLOGIST EVAL & MGMT  03/05/2017  . MASTECTOMY, PARTIAL Right     No Known Allergies  Allergies as of 10/07/2017   No Known Allergies     Medication List        Accurate as of 10/07/17 12:29 PM.  Always use your most recent med list.          acetaminophen 325 MG tablet Commonly known as:  TYLENOL Take 650 mg by mouth every 4 (four) hours as needed.   Cholecalciferol 1000 units tablet Take 1,000 Units by mouth at bedtime.   donepezil 10 MG tablet Commonly known as:  ARICEPT Take 1 tablet by mouth at bedtime.   Normal Saline Flush 0.9 % Soln Flush cholecystostomy tube daily with 5 cc  Once a day   TUMS 500 MG chewable tablet Generic drug:  calcium carbonate Chew 1 tablet by mouth daily.   Vitamin B-12 1000 MCG Subl Place 1 tablet under the tongue at bedtime.       Review of Systems  Immunization History  Administered Date(s) Administered  . Influenza-Unspecified 07/28/2014, 07/20/2016, 07/26/2017  . Pneumococcal Conjugate-13 06/21/2017  . Pneumococcal-Unspecified 08/01/2016  . Tdap 07/11/2017   Pertinent  Health Maintenance Due  Topic Date Due  . INFLUENZA VACCINE  Completed  . DEXA SCAN  Completed  . PNA vac Low Risk Adult  Completed   Fall Risk  07/11/2017  Falls in the past year? No   Functional Status Survey:    Vitals:   10/07/17 1223  BP: 96/60  Pulse: 66  Resp: 18  Temp: (!) 95.5 F (35.3 C)  TempSrc: Oral   There is no height  or weight on file to calculate BMI. Physical Exam  Labs reviewed: Recent Labs    04/05/17 0720 07/31/17 0700 10/05/17 1506  NA 140 141 141  K 3.9 4.3 4.6  CL 105 107 108  CO2 28 25 27   GLUCOSE 98 85 99  BUN 14 19 19   CREATININE 0.86 0.93 0.86  CALCIUM 8.7* 8.8* 9.1   Recent Labs    04/05/17 0720 07/31/17 0700 10/05/17 1506  AST 15 20 19   ALT 18 21 19   ALKPHOS 76 84 84  BILITOT 0.4 0.6 0.2*  PROT 7.0 7.1 7.3  ALBUMIN 3.1* 3.4* 3.6   Recent Labs    03/06/17 0700 04/05/17 0720 07/31/17 0700 10/05/17 1506  WBC 7.9 6.7 7.4 6.4  NEUTROABS 4.7 4.3  --  3.8  HGB 12.8 11.6* 13.1 12.9  HCT 40.0 36.6 41.0 40.2  MCV 93.2 92.2 93.4 95.9  PLT 283 268 276 252   Lab Results  Component Value Date    TSH 4.747 (H) 09/24/2017   No results found for: HGBA1C No results found for: CHOL, HDL, LDLCALC, LDLDIRECT, TRIG, CHOLHDL  Significant Diagnostic Results in last 30 days:  Ct Abdomen Pelvis W Contrast  Result Date: 10/05/2017 CLINICAL DATA:  Abdominal pain EXAM: CT ABDOMEN AND PELVIS WITH CONTRAST TECHNIQUE: Multidetector CT imaging of the abdomen and pelvis was performed using the standard protocol following bolus administration of intravenous contrast. CONTRAST:  181mL ISOVUE-300 IOPAMIDOL (ISOVUE-300) INJECTION 61% COMPARISON:  01/26/2017 FINDINGS: Lower chest: No acute abnormality. Hepatobiliary: No focal liver abnormality. There is a percutaneous transhepatic cholecystostomy tube in place with decompression of the gallbladder. Stones within the lumen of the gallbladder measure up to 6 mm. No biliary dilatation. Pancreas: Unremarkable. No pancreatic ductal dilatation or surrounding inflammatory changes. Spleen: Normal in size without focal abnormality. Adrenals/Urinary Tract: Normal appearance of the adrenal glands. The left kidney appears normal. Right renal scarring. No kidney mass or hydronephrosis identified. Urinary bladder appears normal. Stomach/Bowel: The stomach is normal. The small bowel loops have a normal course and caliber. No bowel obstruction. Numerous colonic diverticula identified without acute inflammation. Vascular/Lymphatic: Aortic atherosclerosis. No aneurysm. No upper abdominal adenopathy. No pelvic or inguinal adenopathy. Reproductive: Uterus and adnexal structures are unremarkable. Pessary identified. Other: Ventral abdominal wall laxity is identified. No abdominal wall hernia or abnormality. No abdominopelvic ascites. Musculoskeletal: Degenerative disc disease noted. IMPRESSION: 1. No acute findings identified within the abdomen or pelvis. 2. Percutaneous cholecystostomy tube appears to be in appropriate position with decompressed gallbladder containing stones. 3. No  pericholecystic fluid collections or biliary dilatation identified. 4.  Aortic Atherosclerosis (ICD10-I70.0). Electronically Signed   By: Kerby Moors M.D.   On: 10/05/2017 17:21    Assessment/Plan There are no diagnoses linked to this encounter.   Family/ staff Communication:   Labs/tests ordered:

## 2017-10-07 NOTE — Telephone Encounter (Signed)
Possible re-admission to facility. This is a patient you were seeing at [facility]. [facility] F/U is needed if patient was re-admitted to facility upon discharge. Hospital discharge from [place] on 10/06/2017

## 2017-10-07 NOTE — Progress Notes (Signed)
Location:   [place] Room Number: 141/D Place of Service:  SNF (31) Provider:  Clydene Fake, MD  Patient Care Team: Virgie Dad, MD as PCP - General (Internal Medicine) Rolm Baptise as Physician Assistant (Internal Medicine)  Extended Emergency Contact Information Primary Emergency Contact: [name], [place] [address] [place] of [place] Phone: [phone] Relation: Friend Secondary Emergency Contact: [place],[name]  [place] Phone: [phone] Relation: Daughter  Code Status: Full Code  Goals of care: Advanced Directive information Advanced Directives 10/07/2017  Does Patient Have a Medical Advance Directive? Yes  Type of Advance Directive (No Data)  Does patient want to make changes to medical advance directive? No - Patient declined  Copy of [place] in Chart? -  Would patient like information on creating a medical advance directive? No - Patient declined     Chief Complaint  Patient presents with  . Medical Management of Chronic Issues    Routine Visit  . Acute Visit    F/U From ED Visit    HPI:  Pt is a 83 y.o. female seen today for medical management of chronic diseases.    Patient has h/o Breast cancer s/p Modified Mastectomy, S/P right hemi colectomy and past history of C2 fracture. Patient was diagnosed with Acute Cholecystitis in 04/18. Since she was considered high risk for surgery she had percutaneous cholecystostomy drain placement 01/27/17 by IR. Patient has her tube recently changed on 11/18. With plan to change it in  8 weeks.Marland Kitchen sHe has been mostly stable in the facility but over the weekend she complained of severe abdominal pain around her drainage site and was sent to the emergency room.  She had a CT scan done Which did not show any acute.  Findings . She was sent back to the facility. Patient did not have any complaints  today.. She does not even remember having any abdominal pain or going to the emergency room.  She denied any nausea vomiting.   Her weight is 146 pounds.  SHe has no new nursing issues.  She is stable in the facility. Her weight is 143 lbs. Which is slightly less then few months ago. But patient says she has good appetite. D/W Nurses and she has not had any new issues. Past Medical History:  Diagnosis Date  . Breast cancer ([place])   . Cognitive communication deficit   . Dementia   . Difficulty in walking(719.7)   . Fall   . Muscle weakness (generalized)   . Urinary tract infection, site not specified   . Vitamin B deficiency    Past Surgical History:  Procedure Laterality Date  . COLON SURGERY    . IR CATHETER TUBE CHANGE  05/07/2017  . IR EXCHANGE BILIARY DRAIN  07/02/2017  . IR EXCHANGE BILIARY DRAIN  07/10/2017  . IR EXCHANGE BILIARY DRAIN  08/28/2017  . IR PERC CHOLECYSTOSTOMY  01/27/2017  . IR RADIOLOGIST EVAL & MGMT  03/05/2017  . MASTECTOMY, PARTIAL Right     No Known Allergies  Allergies as of 10/07/2017   No Known Allergies     Medication List        Accurate as of 10/07/17  1:58 PM. Always use your most recent med list.          acetaminophen 325 MG tablet Commonly known as:  TYLENOL Take 650 mg by mouth every 4 (  four) hours as needed.   Cholecalciferol 1000 units tablet Take 1,000 Units by mouth at bedtime.   donepezil 10 MG tablet Commonly known as:  ARICEPT Take 1 tablet by mouth at bedtime.   Normal Saline Flush 0.9 % Soln Flush cholecystostomy tube daily with 5 cc  Once a day   TUMS 500 MG chewable tablet Generic drug:  calcium carbonate Chew 1 tablet by mouth daily.   Vitamin B-12 1000 MCG Subl Place 1 tablet under the tongue at bedtime.       Review of Systems  Unable to perform ROS: Dementia    Immunization History  Administered Date(s) Administered  . Influenza-Unspecified 07/28/2014, 07/20/2016, 07/26/2017  . Pneumococcal  Conjugate-13 06/21/2017  . Pneumococcal-Unspecified 08/01/2016  . Tdap 07/11/2017   Pertinent  Health Maintenance Due  Topic Date Due  . INFLUENZA VACCINE  Completed  . DEXA SCAN  Completed  . PNA vac Low Risk Adult  Completed   Fall Risk  07/11/2017  Falls in the past year? No   Functional Status Survey:    Vitals:   10/07/17 1223  BP: 96/60  Pulse: 66  Resp: 18  Temp: (!) 95.5 F (35.3 C)  TempSrc: Oral   There is no height or weight on file to calculate BMI. Physical Exam  Constitutional: She appears well-developed and well-nourished.  HENT:  Head: Normocephalic.  Mouth/Throat: Oropharynx is clear and moist.  Eyes: Pupils are equal, round, and reactive to light.  Neck: Neck supple.  Cardiovascular: Normal rate and normal heart sounds.  Pulmonary/Chest: Effort normal and breath sounds normal. No respiratory distress. She has no wheezes.  Abdominal: Soft. Bowel sounds are normal. She exhibits no distension. There is no tenderness. There is no rebound.  Musculoskeletal: She exhibits no edema.  Neurological: She is alert.  Skin: Skin is warm and dry.    Labs reviewed: Recent Labs    04/05/17 0720 07/31/17 0700 10/05/17 1506  NA 140 141 141  K 3.9 4.3 4.6  CL 105 107 108  CO2 28 25 27   GLUCOSE 98 85 99  BUN 14 19 19   CREATININE 0.86 0.93 0.86  CALCIUM 8.7* 8.8* 9.1   Recent Labs    04/05/17 0720 07/31/17 0700 10/05/17 1506  AST 15 20 19   ALT 18 21 19   ALKPHOS 76 84 84  BILITOT 0.4 0.6 0.2*  PROT 7.0 7.1 7.3  ALBUMIN 3.1* 3.4* 3.6   Recent Labs    03/06/17 0700 04/05/17 0720 07/31/17 0700 10/05/17 1506  WBC 7.9 6.7 7.4 6.4  NEUTROABS 4.7 4.3  --  3.8  HGB 12.8 11.6* 13.1 12.9  HCT 40.0 36.6 41.0 40.2  MCV 93.2 92.2 93.4 95.9  PLT 283 268 276 252   Lab Results  Component Value Date   TSH 4.747 (H) 09/24/2017   No results found for: HGBA1C No results found for: CHOL, HDL, LDLCALC, LDLDIRECT, TRIG, CHOLHDL  Significant Diagnostic  Results in last 30 days:  Ct Abdomen Pelvis W Contrast  Result Date: 10/05/2017 CLINICAL DATA:  Abdominal pain EXAM: CT ABDOMEN AND PELVIS WITH CONTRAST TECHNIQUE: Multidetector CT imaging of the abdomen and pelvis was performed using the standard protocol following bolus administration of intravenous contrast. CONTRAST:  159mL ISOVUE-300 IOPAMIDOL (ISOVUE-300) INJECTION 61% COMPARISON:  01/26/2017 FINDINGS: Lower chest: No acute abnormality. Hepatobiliary: No focal liver abnormality. There is a percutaneous transhepatic cholecystostomy tube in place with decompression of the gallbladder. Stones within the lumen of the gallbladder measure up to 6 mm. No  biliary dilatation. Pancreas: Unremarkable. No pancreatic ductal dilatation or surrounding inflammatory changes. Spleen: Normal in size without focal abnormality. Adrenals/Urinary Tract: Normal appearance of the adrenal glands. The left kidney appears normal. Right renal scarring. No kidney mass or hydronephrosis identified. Urinary bladder appears normal. Stomach/Bowel: The stomach is normal. The small bowel loops have a normal course and caliber. No bowel obstruction. Numerous colonic diverticula identified without acute inflammation. Vascular/Lymphatic: Aortic atherosclerosis. No aneurysm. No upper abdominal adenopathy. No pelvic or inguinal adenopathy. Reproductive: Uterus and adnexal structures are unremarkable. Pessary identified. Other: Ventral abdominal wall laxity is identified. No abdominal wall hernia or abnormality. No abdominopelvic ascites. Musculoskeletal: Degenerative disc disease noted. IMPRESSION: 1. No acute findings identified within the abdomen or pelvis. 2. Percutaneous cholecystostomy tube appears to be in appropriate position with decompressed gallbladder containing stones. 3. No pericholecystic fluid collections or biliary dilatation identified. 4.  Aortic Atherosclerosis (ICD10-I70.0). Electronically Signed   By: Kerby Moors M.D.    On: 10/05/2017 17:21    Assessment/Plan  S/P percutaneous cholecystostomy drain placement  Per radiology her cystic Duct is blocked and she will need Drain for long time aith exchange every 2 months. Patient had a repeat CAT scan done in the emergency room and did not show any acute changes.  Her liver enzymes have been normal.  She has been gaining weight and has been largely asymptomatic. Dementia She is on Aricept and supportive care Patient dementia  seemed worse I think is just a progression of her disease. Mildly elevated TSH We will repeat in 4 weeks.  Family/ staff Communication:   Labs/tests ordered:

## 2017-10-09 ENCOUNTER — Other Ambulatory Visit: Payer: Self-pay

## 2017-10-09 ENCOUNTER — Inpatient Hospital Stay
Admission: RE | Admit: 2017-10-09 | Discharge: 2019-06-24 | Disposition: A | Discharge status: Nursing Home (Includes ICF) | Payer: Medicare Other | Source: Ambulatory Visit | Attending: Internal Medicine | Admitting: Internal Medicine

## 2017-10-09 ENCOUNTER — Emergency Department (HOSPITAL_COMMUNITY): Payer: Medicare Other

## 2017-10-09 ENCOUNTER — Encounter (HOSPITAL_COMMUNITY): Payer: Self-pay | Admitting: Emergency Medicine

## 2017-10-09 ENCOUNTER — Emergency Department (HOSPITAL_COMMUNITY)
Admission: EM | Admit: 2017-10-09 | Discharge: 2017-10-09 | Disposition: A | Discharge status: Home / Self Care | Payer: Medicare Other | Attending: Emergency Medicine | Admitting: Emergency Medicine

## 2017-10-09 DIAGNOSIS — K8050 Calculus of bile duct without cholangitis or cholecystitis without obstruction: Secondary | ICD-10-CM

## 2017-10-09 DIAGNOSIS — F0390 Unspecified dementia without behavioral disturbance: Secondary | ICD-10-CM | POA: Diagnosis not present

## 2017-10-09 DIAGNOSIS — I10 Essential (primary) hypertension: Secondary | ICD-10-CM | POA: Insufficient documentation

## 2017-10-09 DIAGNOSIS — Z0000 Encounter for general adult medical examination without abnormal findings: Secondary | ICD-10-CM

## 2017-10-09 DIAGNOSIS — G8918 Other acute postprocedural pain: Secondary | ICD-10-CM

## 2017-10-09 DIAGNOSIS — Z79899 Other long term (current) drug therapy: Secondary | ICD-10-CM | POA: Insufficient documentation

## 2017-10-09 DIAGNOSIS — R109 Unspecified abdominal pain: Secondary | ICD-10-CM

## 2017-10-09 DIAGNOSIS — Z853 Personal history of malignant neoplasm of breast: Secondary | ICD-10-CM | POA: Diagnosis not present

## 2017-10-09 DIAGNOSIS — Z978 Presence of other specified devices: Secondary | ICD-10-CM | POA: Diagnosis not present

## 2017-10-09 DIAGNOSIS — K8020 Calculus of gallbladder without cholecystitis without obstruction: Principal | ICD-10-CM

## 2017-10-09 DIAGNOSIS — K819 Cholecystitis, unspecified: Secondary | ICD-10-CM

## 2017-10-09 DIAGNOSIS — R140 Abdominal distension (gaseous): Secondary | ICD-10-CM | POA: Diagnosis not present

## 2017-10-09 LAB — COMPREHENSIVE METABOLIC PANEL
ALT: 22 U/L (ref 14–54)
AST: 34 U/L (ref 15–41)
Albumin: 3.5 g/dL (ref 3.5–5.0)
Alkaline Phosphatase: 76 U/L (ref 38–126)
Anion gap: 11 (ref 5–15)
BUN: 16 mg/dL (ref 6–20)
CO2: 22 mmol/L (ref 22–32)
Calcium: 9.1 mg/dL (ref 8.9–10.3)
Chloride: 106 mmol/L (ref 101–111)
Creatinine, Ser: 0.89 mg/dL (ref 0.44–1.00)
GFR calc Af Amer: >60 mL/min (ref >60)
GFR calc non Af Amer: 55 mL/min — ABNORMAL LOW (ref >60)
Glucose, Bld: 93 mg/dL (ref 65–99)
Potassium: 5.4 mmol/L — ABNORMAL HIGH (ref 3.5–5.1)
Sodium: 139 mmol/L (ref 135–145)
Total Bilirubin: 0.8 mg/dL (ref 0.3–1.2)
Total Protein: 7.5 g/dL (ref 6.5–8.1)

## 2017-10-09 LAB — CBC WITH DIFFERENTIAL/PLATELET
Basophils Absolute: 0 10*3/uL (ref 0.0–0.1)
Basophils Relative: 0 %
Eosinophils Absolute: 0.4 10*3/uL (ref 0.0–0.7)
Eosinophils Relative: 5 %
HCT: 41.5 % (ref 36.0–46.0)
Hemoglobin: 13.2 g/dL (ref 12.0–15.0)
Lymphocytes Relative: 30 %
Lymphs Abs: 2.1 10*3/uL (ref 0.7–4.0)
MCH: 30.2 pg (ref 26.0–34.0)
MCHC: 31.8 g/dL (ref 30.0–36.0)
MCV: 95 fL (ref 78.0–100.0)
Monocytes Absolute: 0.3 10*3/uL (ref 0.1–1.0)
Monocytes Relative: 4 %
Neutro Abs: 4.2 10*3/uL (ref 1.7–7.7)
Neutrophils Relative %: 61 %
Platelets: 250 10*3/uL (ref 150–400)
RBC: 4.37 MIL/uL (ref 3.87–5.11)
RDW: 15.4 % (ref 11.5–15.5)
WBC: 7 10*3/uL (ref 4.0–10.5)

## 2017-10-09 LAB — LACTIC ACID, PLASMA
Lactic Acid, Venous: 1.6 mmol/L (ref 0.5–1.9)
Lactic Acid, Venous: 1.1 mmol/L (ref 0.5–1.9)

## 2017-10-09 LAB — URINALYSIS, ROUTINE W REFLEX MICROSCOPIC
Bilirubin Urine: NEGATIVE
Glucose, UA: NEGATIVE mg/dL
Hgb urine dipstick: NEGATIVE
Ketones, ur: NEGATIVE mg/dL
Leukocytes, UA: NEGATIVE
Nitrite: NEGATIVE
Protein, ur: NEGATIVE mg/dL
Specific Gravity, Urine: 1.016 (ref 1.005–1.030)
pH: 5 (ref 5.0–8.0)

## 2017-10-09 LAB — LIPASE, BLOOD: Lipase: 52 U/L — ABNORMAL HIGH (ref 11–51)

## 2017-10-09 MED ORDER — MORPHINE SULFATE (PF) 2 MG/ML IV SOLN: 2.0000 mg | INTRAVENOUS | Status: DC | PRN

## 2017-10-09 MED ORDER — IOPAMIDOL (ISOVUE-300) INJECTION 61%
100.0000 mL | Freq: Once | INTRAVENOUS | Status: AC | PRN
  Administered 2017-10-09: 100 mL via INTRAVENOUS

## 2017-10-09 NOTE — Discharge Instructions (Signed)
Take your usual prescriptions as previously directed.  Call your regular medical doctor today to schedule a follow up appointment within the next 2 days.  Return to the Emergency Department immediately sooner if worsening.  ° °

## 2017-10-09 NOTE — ED Notes (Signed)
Called report to [facility].  Staff will come pick up pt.

## 2017-10-09 NOTE — ED Notes (Signed)
Patient transported to X-ray 

## 2017-10-09 NOTE — ED Notes (Signed)
Patient's choleostemy tube was under her incontinence diaper. The tubing was kinked and 100% blocked. This nurse strained out the tubing to ensure proper drainage.

## 2017-10-09 NOTE — ED Notes (Signed)
Pt returned from CT °

## 2017-10-09 NOTE — ED Provider Notes (Signed)
[facility] EMERGENCY DEPARTMENT Provider Note   CSN: [id number] Arrival date & time: 10/09/17  2951     History   Chief Complaint Chief Complaint  Patient presents with  . Abdominal Pain    HPI Cynthia [name] is a 83 y.o. female.  The history is provided by the nursing home, the patient and a caregiver. The history is limited by the condition of the patient (Hx dementia).  Abdominal Pain    Pt was seen at 0910.  Per pt and NH report:  Pt has been c/o right sided abd "pain" for the past 4 days. Pt was evaluated in the ED 4 days ago for this complaint with reassuring workup, dx post-op pain due to cholecystostomy tube (present since 01/2017, changed 08/28/2017).  NH states pt c/o "worse pain" today, so they sent her back to the hospital for re-evaluation. Denies CP/SOB, no N/V/D, no fevers, no rash.   Past Medical History:  Diagnosis Date  . Breast cancer ([place])   . Cognitive communication deficit   . Dementia   . Difficulty in walking(719.7)   . Fall   . Muscle weakness (generalized)   . Urinary tract infection, site not specified   . Vitamin B deficiency     Patient Active Problem List   Diagnosis Date Noted  . S/P Cholecystostomy 01/31/2017  . Cholecystitis, acute with cholelithiasis 01/26/2017  . HTN (hypertension) 08/26/2016  . Renal insufficiency, mild 03/15/2016  . UTI (urinary tract infection) 12/11/2013  . Breast cancer ([place]) 12/11/2013  . Benign neoplasm of colon 12/11/2013  . Dementia 12/11/2013  . C2 cervical fracture ([place]) 12/08/2013  . Syncope 12/08/2013    Past Surgical History:  Procedure Laterality Date  . COLON SURGERY    . IR CATHETER TUBE CHANGE  05/07/2017  . IR EXCHANGE BILIARY DRAIN  07/02/2017  . IR EXCHANGE BILIARY DRAIN  07/10/2017  . IR EXCHANGE BILIARY DRAIN  08/28/2017  . IR PERC CHOLECYSTOSTOMY  01/27/2017  . IR RADIOLOGIST EVAL & MGMT  03/05/2017  . MASTECTOMY, PARTIAL Right     OB History    No data available       Home  Medications    Prior to Admission medications   Medication Sig Start Date End Date Taking? Authorizing Provider  acetaminophen (TYLENOL) 325 MG tablet Take 650 mg by mouth every 4 (four) hours as needed.    [provider]  calcium carbonate (TUMS) 500 MG chewable tablet Chew 1 tablet by mouth daily.    [provider]  Cholecalciferol 1000 units tablet Take 1,000 Units by mouth at bedtime.     [provider]  Cyanocobalamin (VITAMIN B-12) 1000 MCG SUBL Place 1 tablet under the tongue at bedtime.    [provider]  donepezil (ARICEPT) 10 MG tablet Take 1 tablet by mouth at bedtime. 01/24/17   [provider]  Sodium Chloride Flush (NORMAL SALINE FLUSH) 0.9 % SOLN Flush cholecystostomy tube daily with 5 cc  Once a day    [provider]    Family History Family History  Problem Relation Age of Onset  . Cancer Father        ? primary  . Diabetes Neg Hx   . Heart disease Neg Hx   . Stroke Neg Hx     Social History Social History   Tobacco Use  . Smoking status: Never Smoker  . Smokeless tobacco: Never Used  Substance Use Topics  . Alcohol use: No  . Drug  use: No     Allergies   Patient has no known allergies.   Review of Systems Review of Systems  Unable to perform ROS: Dementia  Gastrointestinal: Positive for abdominal pain.     Physical Exam Updated Vital Signs BP (!) 122/56   Pulse 66   Temp (!) 97.5 F (36.4 C) (Oral)   Resp 18   SpO2 95%    Patient Vitals for the past 24 hrs:  BP Temp Temp src Pulse Resp SpO2  10/09/17 1152 (!) 122/56 - - 66 18 95 %  10/09/17 1000 (!) 104/53 - - 63 - 93 %  10/09/17 0930 (!) 114/51 - - 65 - 96 %  10/09/17 0828 (!) 160/62 (!) 97.5 F (36.4 C) Oral 78 18 96 %     Physical Exam 0915: Physical examination:  Nursing notes reviewed; Vital signs and O2 SAT reviewed;  Constitutional: Well developed, Well nourished, Well hydrated, In no acute distress; Head:   Normocephalic, atraumatic; Eyes: EOMI, PERRL, No scleral icterus; ENMT: Mouth and pharynx normal, Mucous membranes moist; Neck: Supple, Full range of motion, No lymphadenopathy; Cardiovascular: Regular rate and rhythm, No gallop; Respiratory: Breath sounds clear & equal bilaterally, No wheezes.  Speaking full sentences with ease, Normal respiratory effort/excursion; Chest: Nontender, Movement normal; Abdomen: Soft, +cholecystostomy tube RUQ without drainage, no rash. +generalized abd tenderness to palp. Nondistended, Normal bowel sounds; Genitourinary: No CVA tenderness; Extremities: Pulses normal, No tenderness, No edema, No calf edema or asymmetry.; Neuro: Awake, alert, confused per hx dementia. No facial droop. Speech clear. Moves all extremities spontaneously and to command without apparent gross focal motor deficits.; Skin: Color normal, Warm, Dry.   ED Treatments / Results  Labs (all labs ordered are listed, but only abnormal results are displayed)   EKG  EKG Interpretation None       Radiology   Procedures Procedures (including critical care time)  Medications Ordered in ED Medications  morphine 2 MG/ML injection 2 mg (not administered)  iopamidol (ISOVUE-300) 61 % injection 100 mL (100 mLs Intravenous Contrast Given 10/09/17 1034)     Initial Impression / Assessment and Plan / ED Course  I have reviewed the triage vital signs and the nursing notes.  Pertinent labs & imaging results that were available during my care of the patient were reviewed by me and considered in my medical decision making (see chart for details).  MDM Reviewed: previous chart, nursing note and vitals Reviewed previous: CT scan, labs and x-ray Interpretation: labs, x-ray and CT scan    Results for orders placed or performed during the hospital encounter of 10/09/17  Urinalysis, Routine w reflex microscopic  Result Value Ref Range   Color, Urine YELLOW YELLOW   APPearance CLEAR CLEAR    Specific Gravity, Urine 1.016 1.005 - 1.030   pH 5.0 5.0 - 8.0   Glucose, UA NEGATIVE NEGATIVE mg/dL   Hgb urine dipstick NEGATIVE NEGATIVE   Bilirubin Urine NEGATIVE NEGATIVE   Ketones, ur NEGATIVE NEGATIVE mg/dL   Protein, ur NEGATIVE NEGATIVE mg/dL   Nitrite NEGATIVE NEGATIVE   Leukocytes, UA NEGATIVE NEGATIVE  Comprehensive metabolic panel  Result Value Ref Range   Sodium 139 135 - 145 mmol/L   Potassium 5.4 (H) 3.5 - 5.1 mmol/L   Chloride 106 101 - 111 mmol/L   CO2 22 22 - 32 mmol/L   Glucose, Bld 93 65 - 99 mg/dL   BUN 16 6 - 20 mg/dL   Creatinine, Ser 0.89 0.44 - 1.00 mg/dL  Calcium 9.1 8.9 - 10.3 mg/dL   Total Protein 7.5 6.5 - 8.1 g/dL   Albumin 3.5 3.5 - 5.0 g/dL   AST 34 15 - 41 U/L   ALT 22 14 - 54 U/L   Alkaline Phosphatase 76 38 - 126 U/L   Total Bilirubin 0.8 0.3 - 1.2 mg/dL   GFR calc non Af Amer 55 (L) >60 mL/min   GFR calc Af Amer >60 >60 mL/min   Anion gap 11 5 - 15  Lipase, blood  Result Value Ref Range   Lipase 52 (H) 11 - 51 U/L  CBC with Differential  Result Value Ref Range   WBC 7.0 4.0 - 10.5 K/uL   RBC 4.37 3.87 - 5.11 MIL/uL   Hemoglobin 13.2 12.0 - 15.0 g/dL   HCT 41.5 36.0 - 46.0 %   MCV 95.0 78.0 - 100.0 fL   MCH 30.2 26.0 - 34.0 pg   MCHC 31.8 30.0 - 36.0 g/dL   RDW 15.4 11.5 - 15.5 %   Platelets 250 150 - 400 K/uL   Neutrophils Relative % 61 %   Neutro Abs 4.2 1.7 - 7.7 K/uL   Lymphocytes Relative 30 %   Lymphs Abs 2.1 0.7 - 4.0 K/uL   Monocytes Relative 4 %   Monocytes Absolute 0.3 0.1 - 1.0 K/uL   Eosinophils Relative 5 %   Eosinophils Absolute 0.4 0.0 - 0.7 K/uL   Basophils Relative 0 %   Basophils Absolute 0.0 0.0 - 0.1 K/uL  Lactic acid, plasma  Result Value Ref Range   Lactic Acid, Venous 1.6 0.5 - 1.9 mmol/L   Dg Chest 2 View Result Date: 10/09/2017 CLINICAL DATA:  Right-sided abdominal pain with abdominal distention. EXAM: CHEST  2 VIEW COMPARISON:  Chest x-ray dated December 08, 2013. FINDINGS: The cardiomediastinal  silhouette is normal in size. Prominent epicardial fat pads. Unchanged subsegmental atelectasis in the lingula and left lower lobe. No focal consolidation, pleural effusion, or pneumothorax. No acute osseous abnormality. IMPRESSION: No active cardiopulmonary disease. Electronically Signed   By: Titus Dubin M.D.   On: 10/09/2017 10:35   Ct Abdomen Pelvis W Contrast Result Date: 10/09/2017 CLINICAL DATA:  Right sided abdominal pain since yesterday with worsening today. Abdominal distension. EXAM: CT ABDOMEN AND PELVIS WITH CONTRAST TECHNIQUE: Multidetector CT imaging of the abdomen and pelvis was performed using the standard protocol following bolus administration of intravenous contrast. CONTRAST:  138mL ISOVUE-300 IOPAMIDOL (ISOVUE-300) INJECTION 61% COMPARISON:  10/05/2017. FINDINGS: Lower chest: Lower lobe nodules measure up to 4 mm, similar to 01/26/2017. No pleural fluid. Heart is at the upper limits of normal in size. No pericardial or pleural effusion. Hepatobiliary: Liver is unremarkable. Percutaneous cholecystostomy. Stones are seen in the a decompressed gallbladder. No biliary ductal dilatation. Pancreas: Negative. Spleen: Negative. Adrenals/Urinary Tract: Adrenal glands are unremarkable. Scarring in the right kidney. Kidneys are otherwise unremarkable. Ureters are decompressed. Bladder is grossly unremarkable. Stomach/Bowel: Stomach and small bowel are unremarkable. Right hemicolectomy. Colon is otherwise unremarkable. Vascular/Lymphatic: Atherosclerotic calcification of the arterial vasculature without abdominal aortic aneurysm. No pathologically enlarged lymph nodes. Reproductive: Uterus is visualized. No adnexal mass. A pessary type device is in place. Other: Small bilateral inguinal hernias contain fat. No free fluid. Mesenteries and peritoneum are otherwise unremarkable. Musculoskeletal: No worrisome lytic or sclerotic lesions. Degenerative changes in the spine. IMPRESSION: 1. Cholelithiasis  with indwelling percutaneous cholecystostomy and a decompressed gallbladder. Findings are unchanged from 10/05/2017. 2. Lower lobe nodules measure up to 4 mm, stable from  01/26/2017. No follow-up needed if patient is low-risk (and has no known or suspected primary neoplasm). Non-contrast chest CT can be considered in 12 months if patient is high-risk. This recommendation follows the consensus statement: Guidelines for Management of Incidental Pulmonary Nodules Detected on CT Images: From the Fleischner Society 2017; Radiology 2017; 284:228-243. 3.  Aortic atherosclerosis (ICD10-170.0). Electronically Signed   By: Lorin Picket M.D.   On: 10/09/2017 11:04    0915:  ED RN states pt's cholecystostomy tube was completely kinked and 100% blocked: ED RN straightened tubing with resumption of drainage. Workup ordered  1134:  Workup today reassuring. T/C to General Surgeon Dr. Arnoldo Morale, case discussed, including:  HPI, pertinent PM/SHx, VS/PE, dx testing, ED course and treatment:  OK with ED workup, tx for post-op pain.   1240:  Dx and testing d/w pt and caregivers.  Questions answered.  Verb understanding, agreeable to d/c back to NH with outpt f/u.    Final Clinical Impressions(s) / ED Diagnoses   Final diagnoses:  None    ED Discharge Orders    None        Francine Graven, DO 10/12/17 [id number]

## 2017-10-09 NOTE — ED Triage Notes (Signed)
Pt a/o from [facility]. States abd pain to right side since yesterday. Worse today. abd distended. Denies gu sx. Current colocystectomy tube in place. Tube was kinked. Has had tube for months. Color wnl.

## 2017-10-10 ENCOUNTER — Other Ambulatory Visit (HOSPITAL_COMMUNITY): Payer: Medicare Other

## 2017-10-10 ENCOUNTER — Other Ambulatory Visit (HOSPITAL_COMMUNITY)
Admission: AD | Admit: 2017-10-10 | Discharge: 2017-10-10 | Disposition: A | Discharge status: Home / Self Care | Payer: Medicare Other | Source: Skilled Nursing Facility | Attending: Internal Medicine | Admitting: Internal Medicine

## 2017-10-10 ENCOUNTER — Other Ambulatory Visit: Payer: Self-pay | Admitting: Radiology

## 2017-10-10 ENCOUNTER — Encounter: Payer: Self-pay | Admitting: Internal Medicine

## 2017-10-10 ENCOUNTER — Non-Acute Institutional Stay (SKILLED_NURSING_FACILITY): Payer: Medicare Other | Admitting: Internal Medicine

## 2017-10-10 DIAGNOSIS — Z434 Encounter for attention to other artificial openings of digestive tract: Secondary | ICD-10-CM

## 2017-10-10 DIAGNOSIS — Z978 Presence of other specified devices: Secondary | ICD-10-CM | POA: Diagnosis not present

## 2017-10-10 DIAGNOSIS — E876 Hypokalemia: Secondary | ICD-10-CM | POA: Diagnosis not present

## 2017-10-10 DIAGNOSIS — S12191D Other nondisplaced fracture of second cervical vertebra, subsequent encounter for fracture with routine healing: Secondary | ICD-10-CM

## 2017-10-10 DIAGNOSIS — C50911 Malignant neoplasm of unspecified site of right female breast: Secondary | ICD-10-CM

## 2017-10-10 DIAGNOSIS — F0390 Unspecified dementia without behavioral disturbance: Secondary | ICD-10-CM

## 2017-10-10 DIAGNOSIS — I10 Essential (primary) hypertension: Secondary | ICD-10-CM

## 2017-10-10 LAB — BASIC METABOLIC PANEL
Anion gap: 10 (ref 5–15)
BUN: 21 mg/dL — ABNORMAL HIGH (ref 6–20)
CO2: 25 mmol/L (ref 22–32)
Calcium: 9 mg/dL (ref 8.9–10.3)
Chloride: 104 mmol/L (ref 101–111)
Creatinine, Ser: 1.07 mg/dL — ABNORMAL HIGH (ref 0.44–1.00)
GFR calc Af Amer: 51 mL/min — ABNORMAL LOW (ref >60)
GFR calc non Af Amer: 44 mL/min — ABNORMAL LOW (ref >60)
Glucose, Bld: 100 mg/dL — ABNORMAL HIGH (ref 65–99)
Potassium: 3.9 mmol/L (ref 3.5–5.1)
Sodium: 139 mmol/L (ref 135–145)

## 2017-10-10 NOTE — Progress Notes (Signed)
Location:   [place] Room Number: 141/D Place of Service:  SNF [phone]) Provider:  Freddi Starr, MD  Patient Care Team: Virgie Dad, MD as PCP - General (Internal Medicine) Rolm Baptise as Physician Assistant (Internal Medicine)  Extended Emergency Contact Information Primary Emergency Contact: [name], [place] [address] [place] Phone: [phone] Relation: Friend Secondary Emergency Contact: Sayed,[name]  [place] Phone: [phone] Relation: Daughter  Code Status:  Full Code Goals of care: Advanced Directive information Advanced Directives 10/10/2017  Does Patient Have a Medical Advance Directive? Yes  Type of Advance Directive (No Data)  Does patient want to make changes to medical advance directive? No - Patient declined  Copy of [place] in Chart? -  Would patient like information on creating a medical advance directive? No - Patient declined     Chief Complaint  Patient presents with  . Acute Visit    F/U ED Visit  For abdominal pain   HPI:  Pt is a 83 y.o. female seen today for an acute visit for follow-up of ER visit yesterday for abdominal pain.  Patient is a long-term resident of facility with a history of breast cancer status post modified mastectomy also status post right hemicolectomy and history of a C2 fracture in the past  Back in April 2018 she was diagnosed with acute chol09-cystitis she was a high risk surgical candidate so she had a percutaneouscholo colostomy  drain placed by interventional radiology.    She had her tube replaced on November 18 with plan to change it in 8 weeks after that.  She is been stable in the facility but  on December 15 she did complain of acute abdominal pain around the drainage site and went to the ER-CT scan did not show any acute findings and she came back to the facility.  She  was relatively asymptomatic but apparently yesterday again developed severe abdominal pain around the site and was sent back to the ER.  CT scan did not show any acute changes it appears lab work was fairly unremarkable potassium was minimally elevated at 5.4 and we will recheck that today.  She did receive apparently a dose of morphine-and has come back to facility she is not complaining of any pain today.  There are plans apparently for adjustment of the drain tomorrow  by interventional radiology.  Vital signs are stable she is afebrile today she is confused but is not complaining of any discomfort-  Past Medical History:  Diagnosis Date  . Breast cancer ([place])   . Cognitive communication deficit   . Dementia   . Difficulty in walking(719.7)   . Fall   . Muscle weakness (generalized)   . Urinary tract infection, site not specified   . Vitamin B deficiency    Past Surgical History:  Procedure Laterality Date  . COLON SURGERY    . IR CATHETER TUBE CHANGE  05/07/2017  . IR EXCHANGE BILIARY DRAIN  07/02/2017  . IR EXCHANGE BILIARY DRAIN  07/10/2017  . IR EXCHANGE BILIARY DRAIN  08/28/2017  . IR PERC CHOLECYSTOSTOMY  01/27/2017  . IR RADIOLOGIST EVAL & MGMT  03/05/2017  . MASTECTOMY, PARTIAL Right     No Known Allergies  Outpatient Encounter Medications as of 10/10/2017  Medication Sig  . acetaminophen (TYLENOL) 325 MG tablet Take 650 mg by mouth every 4 (  four) hours as needed.  . calcium carbonate (TUMS) 500 MG chewable tablet Chew 1 tablet by mouth daily.  . Cholecalciferol 1000 units tablet Take 1,000 Units by mouth at bedtime.   . Cyanocobalamin (VITAMIN B-12) 1000 MCG SUBL Place 1 tablet under the tongue at bedtime.  . donepezil (ARICEPT) 10 MG tablet Take 1 tablet by mouth at bedtime.  . Sodium Chloride Flush (NORMAL SALINE FLUSH) 0.9 % SOLN Flush cholecystostomy tube daily with 5 cc  Once a day   No facility-administered encounter medications on file as of 10/10/2017.      Review of Systems   This is limited secondary to dementia but per nursing staff she is at her baseline she is not complaining of any abdominal discomfort this morning  Immunization History  Administered Date(s) Administered  . Influenza-Unspecified 07/28/2014, 07/20/2016, 07/26/2017  . Pneumococcal Conjugate-13 06/21/2017  . Pneumococcal-Unspecified 08/01/2016  . Tdap 07/11/2017   Pertinent  Health Maintenance Due  Topic Date Due  . INFLUENZA VACCINE  Completed  . DEXA SCAN  Completed  . PNA vac Low Risk Adult  Completed   Fall Risk  07/11/2017  Falls in the past year? No   Functional Status Survey:    Vitals:   10/10/17 1107  BP: 133/69  Pulse: 65  Resp: 18  Temp: 98 F (36.7 C)  TempSrc: Oral  SpO2: 98%    Physical Exam   In general this is a pleasant elderly female somewhat confused but in no distress sitting comfortably in a wheelchair.  Her skin is warm and dry.  Chest is clear to auscultation there is no labored breathing.  Heart is regular rate and rhythm without murmur gallop or rub.  Abdomen is soft somewhat obese is not acutely tender--she is somewhat irritated with the palpitation  but is not really complaining of significant discomfort or pain.  Bowel sounds are active   Musculoskeletal moves her extremities at baseline ambulates in a wheelchair.  Neurologic is grossly intact no lateralizing findings.  Her speech is clear.  Psych she is oriented to self and continues to be confused but at baseline  Labs reviewed: Recent Labs    07/31/17 0700 10/05/17 1506 10/09/17 0910  NA 141 141 139  K 4.3 4.6 5.4*  CL 107 108 106  CO2 25 27 22   GLUCOSE 85 99 93  BUN 19 19 16   CREATININE 0.93 0.86 0.89  CALCIUM 8.8* 9.1 9.1   Recent Labs    07/31/17 0700 10/05/17 1506 10/09/17 0910  AST 20 19 34  ALT 21 19 22   ALKPHOS 84 84 76  BILITOT 0.6 0.2* 0.8  PROT 7.1 7.3 7.5  ALBUMIN 3.4* 3.6 3.5   Recent Labs    04/05/17 0720  07/31/17 0700 10/05/17 1506 10/09/17 0910  WBC 6.7 7.4 6.4 7.0  NEUTROABS 4.3  --  3.8 4.2  HGB 11.6* 13.1 12.9 13.2  HCT 36.6 41.0 40.2 41.5  MCV 92.2 93.4 95.9 95.0  PLT 268 276 252 250   Lab Results  Component Value Date   TSH 4.747 (H) 09/24/2017   No results found for: HGBA1C No results found for: CHOL, HDL, LDLCALC, LDLDIRECT, TRIG, CHOLHDL  Significant Diagnostic Results in last 30 days:  Dg Chest 2 View  Result Date: 10/09/2017 CLINICAL DATA:  Right-sided abdominal pain with abdominal distention. EXAM: CHEST  2 VIEW COMPARISON:  Chest x-ray dated December 08, 2013. FINDINGS: The cardiomediastinal silhouette is normal in size. Prominent epicardial fat pads. Unchanged subsegmental atelectasis  in the lingula and left lower lobe. No focal consolidation, pleural effusion, or pneumothorax. No acute osseous abnormality. IMPRESSION: No active cardiopulmonary disease. Electronically Signed   By: Titus Dubin M.D.   On: 10/09/2017 10:35   Ct Abdomen Pelvis W Contrast  Result Date: 10/09/2017 CLINICAL DATA:  Right sided abdominal pain since yesterday with worsening today. Abdominal distension. EXAM: CT ABDOMEN AND PELVIS WITH CONTRAST TECHNIQUE: Multidetector CT imaging of the abdomen and pelvis was performed using the standard protocol following bolus administration of intravenous contrast. CONTRAST:  160mL ISOVUE-300 IOPAMIDOL (ISOVUE-300) INJECTION 61% COMPARISON:  10/05/2017. FINDINGS: Lower chest: Lower lobe nodules measure up to 4 mm, similar to 01/26/2017. No pleural fluid. Heart is at the upper limits of normal in size. No pericardial or pleural effusion. Hepatobiliary: Liver is unremarkable. Percutaneous cholecystostomy. Stones are seen in the a decompressed gallbladder. No biliary ductal dilatation. Pancreas: Negative. Spleen: Negative. Adrenals/Urinary Tract: Adrenal glands are unremarkable. Scarring in the right kidney. Kidneys are otherwise unremarkable. Ureters are  decompressed. Bladder is grossly unremarkable. Stomach/Bowel: Stomach and small bowel are unremarkable. Right hemicolectomy. Colon is otherwise unremarkable. Vascular/Lymphatic: Atherosclerotic calcification of the arterial vasculature without abdominal aortic aneurysm. No pathologically enlarged lymph nodes. Reproductive: Uterus is visualized. No adnexal mass. A pessary type device is in place. Other: Small bilateral inguinal hernias contain fat. No free fluid. Mesenteries and peritoneum are otherwise unremarkable. Musculoskeletal: No worrisome lytic or sclerotic lesions. Degenerative changes in the spine. IMPRESSION: 1. Cholelithiasis with indwelling percutaneous cholecystostomy and a decompressed gallbladder. Findings are unchanged from 10/05/2017. 2. Lower lobe nodules measure up to 4 mm, stable from 01/26/2017. No follow-up needed if patient is low-risk (and has no known or suspected primary neoplasm). Non-contrast chest CT can be considered in 12 months if patient is high-risk. This recommendation follows the consensus statement: Guidelines for Management of Incidental Pulmonary Nodules Detected on CT Images: From the Fleischner Society 2017; Radiology 2017; 284:228-243. 3.  Aortic atherosclerosis (ICD10-170.0). Electronically Signed   By: Lorin Picket M.D.   On: 10/09/2017 11:04   Ct Abdomen Pelvis W Contrast  Result Date: 10/05/2017 CLINICAL DATA:  Abdominal pain EXAM: CT ABDOMEN AND PELVIS WITH CONTRAST TECHNIQUE: Multidetector CT imaging of the abdomen and pelvis was performed using the standard protocol following bolus administration of intravenous contrast. CONTRAST:  110mL ISOVUE-300 IOPAMIDOL (ISOVUE-300) INJECTION 61% COMPARISON:  01/26/2017 FINDINGS: Lower chest: No acute abnormality. Hepatobiliary: No focal liver abnormality. There is a percutaneous transhepatic cholecystostomy tube in place with decompression of the gallbladder. Stones within the lumen of the gallbladder measure up to 6 mm.  No biliary dilatation. Pancreas: Unremarkable. No pancreatic ductal dilatation or surrounding inflammatory changes. Spleen: Normal in size without focal abnormality. Adrenals/Urinary Tract: Normal appearance of the adrenal glands. The left kidney appears normal. Right renal scarring. No kidney mass or hydronephrosis identified. Urinary bladder appears normal. Stomach/Bowel: The stomach is normal. The small bowel loops have a normal course and caliber. No bowel obstruction. Numerous colonic diverticula identified without acute inflammation. Vascular/Lymphatic: Aortic atherosclerosis. No aneurysm. No upper abdominal adenopathy. No pelvic or inguinal adenopathy. Reproductive: Uterus and adnexal structures are unremarkable. Pessary identified. Other: Ventral abdominal wall laxity is identified. No abdominal wall hernia or abnormality. No abdominopelvic ascites. Musculoskeletal: Degenerative disc disease noted. IMPRESSION: 1. No acute findings identified within the abdomen or pelvis. 2. Percutaneous cholecystostomy tube appears to be in appropriate position with decompressed gallbladder containing stones. 3. No pericholecystic fluid collections or biliary dilatation identified. 4.  Aortic Atherosclerosis (ICD10-I70.0). Electronically Signed  By: Kerby Moors M.D.   On: 10/05/2017 17:21    Assessment/Plan  #1 history of abdominal discomfort status post percutaneous cholecystotomy drain placement- abdominal pain appears to have subsided-there is thought possibly some of this may be more dementia related-she is scheduled apparently for a drain procedure tomorrow by interventional radiology.  Workup in the ER yesterday appear to be fairly unremarkable-she appears to be comfortable today.  2.  Mildly elevated potassium level will have this rechecked today to ensure accuracy she is not on any potassium or significant meds that you would think would increase her potassium-one would wonder possibly about some slight  hemolysis but will have this rechecked.  [id number] note greater than 25 minutes spent assessing patient discussing her status with nursing staff-reviewing her ER notes and record as well as labs-and coordinating a plan of care-greater than 50% of time spent coordinating plan of care with input as noted above  Addendum-we have received the update a metabolic panel which is reassuring potassium is 3.9-sodium 139-creatinine is 1.07 BUN of 21 at this point will monitor I suspect initial potassium of 5.4 --may have been hemolysis right

## 2017-10-11 ENCOUNTER — Ambulatory Visit (HOSPITAL_COMMUNITY)
Admit: 2017-10-11 | Discharge: 2017-10-11 | Disposition: A | Discharge status: Home / Self Care | Payer: Medicare Other | Attending: Internal Medicine | Admitting: Internal Medicine

## 2017-10-11 ENCOUNTER — Encounter (HOSPITAL_COMMUNITY): Payer: Self-pay | Admitting: Interventional Radiology

## 2017-10-11 DIAGNOSIS — K8020 Calculus of gallbladder without cholecystitis without obstruction: Secondary | ICD-10-CM | POA: Diagnosis not present

## 2017-10-11 DIAGNOSIS — Z4682 Encounter for fitting and adjustment of non-vascular catheter: Secondary | ICD-10-CM | POA: Diagnosis not present

## 2017-10-11 DIAGNOSIS — Z434 Encounter for attention to other artificial openings of digestive tract: Secondary | ICD-10-CM | POA: Diagnosis not present

## 2017-10-11 HISTORY — PX: IR EXCHANGE BILIARY DRAIN: IMG6046

## 2017-10-11 LAB — URINE CULTURE: Culture: NO GROWTH

## 2017-10-11 MED ORDER — LIDOCAINE HCL 1 % IJ SOLN
INTRAMUSCULAR | Status: AC
  Filled 2017-10-11: qty 20

## 2017-10-11 MED ORDER — LIDOCAINE HCL (PF) 1 % IJ SOLN
INTRAMUSCULAR | Status: DC | PRN
  Administered 2017-10-11: 8 mL

## 2017-10-11 MED ORDER — IOPAMIDOL (ISOVUE-300) INJECTION 61%
INTRAVENOUS | Status: AC
  Administered 2017-10-11: 10 mL
  Filled 2017-10-11: qty 50

## 2017-10-24 DIAGNOSIS — L603 Nail dystrophy: Secondary | ICD-10-CM | POA: Diagnosis not present

## 2017-10-24 DIAGNOSIS — R262 Difficulty in walking, not elsewhere classified: Secondary | ICD-10-CM | POA: Diagnosis not present

## 2017-10-24 DIAGNOSIS — I739 Peripheral vascular disease, unspecified: Secondary | ICD-10-CM | POA: Diagnosis not present

## 2017-10-28 ENCOUNTER — Inpatient Hospital Stay (HOSPITAL_COMMUNITY)
Admit: 2017-10-28 | Discharge: 2017-10-28 | Disposition: A | Discharge status: Home / Self Care | Payer: Medicare Other | Attending: Interventional Radiology | Admitting: Interventional Radiology

## 2017-10-28 ENCOUNTER — Encounter (HOSPITAL_COMMUNITY): Payer: Self-pay | Admitting: Interventional Radiology

## 2017-10-28 ENCOUNTER — Ambulatory Visit (HOSPITAL_COMMUNITY)
Admit: 2017-10-28 | Discharge: 2017-10-28 | DRG: 446 | Disposition: A | Discharge status: Skilled Nursing Facility | Payer: Medicare Other | Attending: Interventional Radiology | Admitting: Interventional Radiology

## 2017-10-28 DIAGNOSIS — K819 Cholecystitis, unspecified: Secondary | ICD-10-CM | POA: Diagnosis not present

## 2017-10-28 DIAGNOSIS — Z434 Encounter for attention to other artificial openings of digestive tract: Secondary | ICD-10-CM | POA: Diagnosis not present

## 2017-10-28 HISTORY — PX: IR EXCHANGE BILIARY DRAIN: IMG6046

## 2017-10-28 MED ORDER — LIDOCAINE HCL 1 % IJ SOLN
INTRAMUSCULAR | Status: AC
  Filled 2017-10-28: qty 20

## 2017-10-28 MED ORDER — IOPAMIDOL (ISOVUE-300) INJECTION 61%
INTRAVENOUS | Status: AC
  Administered 2017-10-28: 5 mL
  Filled 2017-10-28: qty 50

## 2017-10-28 MED ORDER — LIDOCAINE HCL (PF) 1 % IJ SOLN
INTRAMUSCULAR | Status: DC | PRN
  Administered 2017-10-28: 5 mL

## 2017-10-28 NOTE — Procedures (Signed)
10 Fr Chole drain exchange EBL 0 Comp 0 

## 2017-10-30 ENCOUNTER — Encounter: Payer: Self-pay | Admitting: Internal Medicine

## 2017-10-30 ENCOUNTER — Non-Acute Institutional Stay (SKILLED_NURSING_FACILITY): Payer: Medicare Other | Admitting: Internal Medicine

## 2017-10-30 DIAGNOSIS — K8001 Calculus of gallbladder with acute cholecystitis with obstruction: Secondary | ICD-10-CM

## 2017-10-30 DIAGNOSIS — E538 Deficiency of other specified B group vitamins: Secondary | ICD-10-CM | POA: Diagnosis not present

## 2017-10-30 DIAGNOSIS — S12191D Other nondisplaced fracture of second cervical vertebra, subsequent encounter for fracture with routine healing: Secondary | ICD-10-CM | POA: Diagnosis not present

## 2017-10-30 DIAGNOSIS — F0390 Unspecified dementia without behavioral disturbance: Secondary | ICD-10-CM | POA: Diagnosis not present

## 2017-10-30 DIAGNOSIS — E559 Vitamin D deficiency, unspecified: Secondary | ICD-10-CM | POA: Diagnosis not present

## 2017-10-30 NOTE — Progress Notes (Signed)
Location:   [place] Room Number: 141/D Place of Service:    Provider:  Freddi Starr, MD  Patient Care Team: Virgie Dad, MD as PCP - General (Internal Medicine) Rolm Baptise as Physician Assistant (Internal Medicine)  Extended Emergency Contact Information Primary Emergency Contact: [name], [place] [address] [place] Phone: [phone] Relation: Friend Secondary Emergency Contact: Abad,[name]  [place] Phone: [phone] Relation: Daughter  Code Status:  Full Code Goals of care: Advanced Directive information Advanced Directives 10/30/2017  Does Patient Have a Medical Advance Directive? Yes  Type of Advance Directive (No Data)  Does patient want to make changes to medical advance directive? No - Patient declined  Copy of [place] in Chart? -  Would patient like information on creating a medical advance directive? No - Patient declined     Chief Complaint  Patient presents with  . Medical Management of Chronic Issues    Routine Visit  For medical management of chronic medical issues including dementia-history of right hemicolectomy-history of C2 fracture- history of acute cholecystitis in April status post percutaneous colostomy drain placement.    HPI:  Pt is a 84 y.o. female seen today for medical management of chronic diseases.  As noted above she appears to be stable today.  Most acute issue has involved abdominal pain and she did go to the ER last month her workup did not really show any acute changes-they did arrange follow-up with interventional radiology for adjustment of her percutaneous drain-drain was placed back in April 2018 when she was diagnosed with acute cholecystitis-she was a high risk for surgery so the drain was placed and this is been fairly stable.  Regards to her other issues this appears to be stable she is  actually gained a small amount of weight.  She also has a distant history of a C2 fracture which appears to have healed unremarkably at one point she had an Aspen collar.  In regards to dementia again she is doing well with supportive care she is on Aricept. Nursing does not report any issues today      Past Medical History:  Diagnosis Date  . Breast cancer ([place])   . Cognitive communication deficit   . Dementia   . Difficulty in walking(719.7)   . Fall   . Muscle weakness (generalized)   . Urinary tract infection, site not specified   . Vitamin B deficiency    Past Surgical History:  Procedure Laterality Date  . COLON SURGERY    . IR CATHETER TUBE CHANGE  05/07/2017  . IR EXCHANGE BILIARY DRAIN  07/02/2017  . IR EXCHANGE BILIARY DRAIN  07/10/2017  . IR EXCHANGE BILIARY DRAIN  08/28/2017  . IR EXCHANGE BILIARY DRAIN  10/11/2017  . IR EXCHANGE BILIARY DRAIN  10/28/2017  . IR PERC CHOLECYSTOSTOMY  01/27/2017  . IR RADIOLOGIST EVAL & MGMT  03/05/2017  . MASTECTOMY, PARTIAL Right     No Known Allergies  Outpatient Encounter Medications as of 10/30/2017  Medication Sig  . acetaminophen (TYLENOL) 325 MG tablet Take 650 mg by mouth every 4 (four) hours as needed.  . calcium carbonate (TUMS) 500 MG chewable tablet Chew 1 tablet by mouth daily.  . Cholecalciferol 1000 units tablet Take 1,000 Units by mouth at bedtime.   . Cyanocobalamin (VITAMIN B-12) 1000 MCG SUBL Place 1 tablet under the  tongue at bedtime.  . donepezil (ARICEPT) 10 MG tablet Take 1 tablet by mouth at bedtime.  . Sodium Chloride Flush (NORMAL SALINE FLUSH) 0.9 % SOLN Flush cholecystostomy tube daily with 5 cc  Once a day   No facility-administered encounter medications on file as of 10/30/2017.      Review of Systems this is limited secondary to dementia please see HPI-   She does not complain of any abdominal discomfort shortness of breath chest pain today continues to sit comfortably in her wheelchair she does  ambulate often in the hallway when she goes to activities  Immunization History  Administered Date(s) Administered  . Influenza-Unspecified 07/28/2014, 07/20/2016, 07/26/2017  . Pneumococcal Conjugate-13 06/21/2017  . Pneumococcal-Unspecified 08/01/2016  . Tdap 07/11/2017   Pertinent  Health Maintenance Due  Topic Date Due  . INFLUENZA VACCINE  Completed  . DEXA SCAN  Completed  . PNA vac Low Risk Adult  Completed   Fall Risk  07/11/2017  Falls in the past year? No   Functional Status Survey:    Vitals:   10/30/17 1208  BP: (!) 122/59  Pulse: 95  Resp: 20  Temp: 98 F (36.7 C)  TempSrc: Oral  SpO2: 96%  Weight: 150 lb 6.4 oz (68.2 kg)  Height: 5\' 3"  (1.6 m)   Body mass index is 26.64 kg/m. Physical Exam  In general this is a pleasant elderly female in no distress sitting comfortably in her wheelchair.  Her skin is warm and dry.  Eyes visual acuity appears grossly intact she has prescription lenses.  Oropharynx is clear mucous membranes moist.  Chest is clear to auscultation there is no labored breathing.  Heart is regular rate and rhythm without murmur gallop or rub she has scant lower extremity edema.  Abdomen is obese soft nontender with positive bowel sounds she does have a drain placed with the bag on her right leg has a small amount of drainage.  Musculoskeletal moves all his extremities at baseline she is ambulatory with a walker and appears to do well with that strength appears to be intact upper extremities as well.  Neurologic is grossly intact her speech is clear no lateralizing findings.  Psych she is oriented to self and is pleasant follow simple verbal commands without difficulty      Labs reviewed: Recent Labs    10/05/17 1506 10/09/17 0910 10/10/17 1240  NA 141 139 139  K 4.6 5.4* 3.9  CL 108 106 104  CO2 27 22 25   GLUCOSE 99 93 100*  BUN 19 16 21*  CREATININE 0.86 0.89 1.07*  CALCIUM 9.1 9.1 9.0   Recent Labs    07/31/17 0700  10/05/17 1506 10/09/17 0910  AST 20 19 34  ALT 21 19 22   ALKPHOS 84 84 76  BILITOT 0.6 0.2* 0.8  PROT 7.1 7.3 7.5  ALBUMIN 3.4* 3.6 3.5   Recent Labs    04/05/17 0720 07/31/17 0700 10/05/17 1506 10/09/17 0910  WBC 6.7 7.4 6.4 7.0  NEUTROABS 4.3  --  3.8 4.2  HGB 11.6* 13.1 12.9 13.2  HCT 36.6 41.0 40.2 41.5  MCV 92.2 93.4 95.9 95.0  PLT 268 276 252 250   Lab Results  Component Value Date   TSH 4.747 (H) 09/24/2017   No results found for: HGBA1C No results found for: CHOL, HDL, LDLCALC, LDLDIRECT, TRIG, CHOLHDL  Significant Diagnostic Results in last 30 days:  Dg Chest 2 View  Result Date: 10/09/2017 CLINICAL DATA:  Right-sided abdominal pain  with abdominal distention. EXAM: CHEST  2 VIEW COMPARISON:  Chest x-ray dated December 08, 2013. FINDINGS: The cardiomediastinal silhouette is normal in size. Prominent epicardial fat pads. Unchanged subsegmental atelectasis in the lingula and left lower lobe. No focal consolidation, pleural effusion, or pneumothorax. No acute osseous abnormality. IMPRESSION: No active cardiopulmonary disease. Electronically Signed   By: Titus Dubin M.D.   On: 10/09/2017 10:35   Ct Abdomen Pelvis W Contrast  Result Date: 10/09/2017 CLINICAL DATA:  Right sided abdominal pain since yesterday with worsening today. Abdominal distension. EXAM: CT ABDOMEN AND PELVIS WITH CONTRAST TECHNIQUE: Multidetector CT imaging of the abdomen and pelvis was performed using the standard protocol following bolus administration of intravenous contrast. CONTRAST:  167mL ISOVUE-300 IOPAMIDOL (ISOVUE-300) INJECTION 61% COMPARISON:  10/05/2017. FINDINGS: Lower chest: Lower lobe nodules measure up to 4 mm, similar to 01/26/2017. No pleural fluid. Heart is at the upper limits of normal in size. No pericardial or pleural effusion. Hepatobiliary: Liver is unremarkable. Percutaneous cholecystostomy. Stones are seen in the a decompressed gallbladder. No biliary ductal dilatation.  Pancreas: Negative. Spleen: Negative. Adrenals/Urinary Tract: Adrenal glands are unremarkable. Scarring in the right kidney. Kidneys are otherwise unremarkable. Ureters are decompressed. Bladder is grossly unremarkable. Stomach/Bowel: Stomach and small bowel are unremarkable. Right hemicolectomy. Colon is otherwise unremarkable. Vascular/Lymphatic: Atherosclerotic calcification of the arterial vasculature without abdominal aortic aneurysm. No pathologically enlarged lymph nodes. Reproductive: Uterus is visualized. No adnexal mass. A pessary type device is in place. Other: Small bilateral inguinal hernias contain fat. No free fluid. Mesenteries and peritoneum are otherwise unremarkable. Musculoskeletal: No worrisome lytic or sclerotic lesions. Degenerative changes in the spine. IMPRESSION: 1. Cholelithiasis with indwelling percutaneous cholecystostomy and a decompressed gallbladder. Findings are unchanged from 10/05/2017. 2. Lower lobe nodules measure up to 4 mm, stable from 01/26/2017. No follow-up needed if patient is low-risk (and has no known or suspected primary neoplasm). Non-contrast chest CT can be considered in 12 months if patient is high-risk. This recommendation follows the consensus statement: Guidelines for Management of Incidental Pulmonary Nodules Detected on CT Images: From the Fleischner Society 2017; Radiology 2017; 284:228-243. 3.  Aortic atherosclerosis (ICD10-170.0). Electronically Signed   By: Lorin Picket M.D.   On: 10/09/2017 11:04   Ct Abdomen Pelvis W Contrast  Result Date: 10/05/2017 CLINICAL DATA:  Abdominal pain EXAM: CT ABDOMEN AND PELVIS WITH CONTRAST TECHNIQUE: Multidetector CT imaging of the abdomen and pelvis was performed using the standard protocol following bolus administration of intravenous contrast. CONTRAST:  151mL ISOVUE-300 IOPAMIDOL (ISOVUE-300) INJECTION 61% COMPARISON:  01/26/2017 FINDINGS: Lower chest: No acute abnormality. Hepatobiliary: No focal liver  abnormality. There is a percutaneous transhepatic cholecystostomy tube in place with decompression of the gallbladder. Stones within the lumen of the gallbladder measure up to 6 mm. No biliary dilatation. Pancreas: Unremarkable. No pancreatic ductal dilatation or surrounding inflammatory changes. Spleen: Normal in size without focal abnormality. Adrenals/Urinary Tract: Normal appearance of the adrenal glands. The left kidney appears normal. Right renal scarring. No kidney mass or hydronephrosis identified. Urinary bladder appears normal. Stomach/Bowel: The stomach is normal. The small bowel loops have a normal course and caliber. No bowel obstruction. Numerous colonic diverticula identified without acute inflammation. Vascular/Lymphatic: Aortic atherosclerosis. No aneurysm. No upper abdominal adenopathy. No pelvic or inguinal adenopathy. Reproductive: Uterus and adnexal structures are unremarkable. Pessary identified. Other: Ventral abdominal wall laxity is identified. No abdominal wall hernia or abnormality. No abdominopelvic ascites. Musculoskeletal: Degenerative disc disease noted. IMPRESSION: 1. No acute findings identified within the abdomen or pelvis. 2.  Percutaneous cholecystostomy tube appears to be in appropriate position with decompressed gallbladder containing stones. 3. No pericholecystic fluid collections or biliary dilatation identified. 4.  Aortic Atherosclerosis (ICD10-I70.0). Electronically Signed   By: Kerby Moors M.D.   On: 10/05/2017 17:21   Ir Exchange Biliary Drain  Result Date: 10/28/2017 INDICATION: Routine cholecystostomy tube exchange EXAM: CHOLECYSTOSTOMY TUBE EXCHANGE MEDICATIONS: None ANESTHESIA/SEDATION: None . FLUOROSCOPY TIME:  Fluoroscopy Time:  minutes 12 seconds (1 mGy). COMPLICATIONS: None immediate. PROCEDURE: Informed written consent was obtained from the patient after a thorough discussion of the procedural risks, benefits and alternatives. All questions were addressed.  Maximal Sterile Barrier Technique was utilized including caps, mask, sterile gowns, sterile gloves, sterile drape, hand hygiene and skin antiseptic. A timeout was performed prior to the initiation of the procedure. The right flank was prepped and draped in a sterile fashion. 1% lidocaine was utilized for local anesthesia. The existing 10 French cholecystostomy tube was cut and exchanged over a Bentson wire for a new 10 [place] drain. It was looped and string fixed in the lumen of the gallbladder. Contrast was injected. FINDINGS: Images demonstrate exchange of the 1 French cholecystostomy tube. The cystic duct is patent. IMPRESSION: Successful cholecystostomy tube exchange. Electronically Signed   By: Marybelle Killings M.D.   On: 10/28/2017 15:21   Ir Exchange Biliary Drain  Result Date: 10/11/2017 INDICATION: History of dementia and cholelithiasis with poor operative candidacy post ultrasound fluoroscopic guided cholecystostomy tube placement on 01/27/2017. Patient is subsequent undergone routine fluoroscopic guided exchanges and presents today for routine exchange. EXAM: FLUOROSCOPIC GUIDED CHOLECYSTOSTOMY TUBE INJECTION COMPARISON:  Ultrasound fluoroscopic guided cholecystostomy tube placement - 01/27/2017 ; multiple previous fluoroscopic guided cholecystostomy tube exchanges, most recently on 08/28/2017; CT abdomen and pelvis - 09/29/2017. MEDICATIONS: None ANESTHESIA/SEDATION: None CONTRAST:  10 cc Isovue 300 - administered into the gallbladder fossa. FLUOROSCOPY TIME:  42 seconds (3 mGy) COMPLICATIONS: None immediate. PROCEDURE: The patient was positioned supine on the fluoroscopy table. A preprocedural spot fluoroscopic image was obtained of the right upper abdominal quadrant existing cholecystostomy tube. Multiple spot fluoroscopic radiographic images were obtained of the right upper abdominal quadrant an existing cholecystostomy tube following injection of a small amount of contrast. Images were reviewed and  discussed with the patient. The cholecystostomy tube was flushed with a small amount of saline and capped. A dressing was placed. The patient tolerated the procedure well without immediate postprocedural complication. FINDINGS: Preprocedural spot fluoroscopic image of the right upper abdominal quadrant demonstrates grossly unchanged positioning of the cholecystostomy tube with end coiled and locked overlying the expected location of the gallbladder fundus. Subsequent contrast injection demonstrates appropriate functionality of the cholecystostomy tube with brisk opacification of the gallbladder. There is passage of contrast from the gallbladder through the cystic and common bile ducts the level duodenum. There several persistent nonocclusive filling defects within the gallbladder compatible with cholelithiasis. IMPRESSION: 1. Appropriately positioned and functioning cholecystostomy tube. No exchange performed. 2. Cholelithiasis without evidence of choledocholithiasis. PLAN: Patient has dementia and is currently in a nursing facility. As the patient is tolerating the cholecystostomy tube well, the cholecystostomy tube will be maintained to external drainage (in lieu of the capping trial) and will return in 2 months for routine fluoroscopic guided exchange potential up sizing to 12 [place]. Electronically Signed   By: Sandi Mariscal M.D.   On: 10/11/2017 13:55    Assessment/Plan #1 history of cholelithiasis-per radiology her cystic duct is blocked she will need to drain for an extended period of time to  be exchanged every 2 months-she has had a couple trips to the ER but workup did not really show any acute changes-currently she appears to be stable.  2.  History of dementia she appears to be doing well with supportive care she is on Aricept she actually has gained a small amount of weight.  3.-History of mildly elevated TSH-we are monitoring this and update TSH is pending later this month.--TSH last month was  4.747  4.  History of B12 deficiency this is being supplemented and B12 level was 872 on lab done in December.  5.  History of vitamin D deficiency again this level appears stable at 34.1 on December lab as well and again she is on supplementation.  6.  History of C2 fracture at one point she had an Aspen collar this is been discontinued she appears to be stable as well in this regards.  #7  #7 History of breast carcinoma-patient has declined further workup.  8.  History of anemia this appears stable with a hemoglobin of 13.2 on lab done last month.  Will monitor periodically   [phone]

## 2017-10-31 ENCOUNTER — Encounter: Payer: Self-pay | Admitting: Internal Medicine

## 2017-11-08 ENCOUNTER — Other Ambulatory Visit (HOSPITAL_COMMUNITY)
Admission: RE | Admit: 2017-11-08 | Discharge: 2017-11-08 | Disposition: A | Discharge status: Home / Self Care | Payer: Medicare Other | Source: Skilled Nursing Facility | Attending: Internal Medicine | Admitting: Internal Medicine

## 2017-11-08 DIAGNOSIS — Z9181 History of falling: Secondary | ICD-10-CM | POA: Insufficient documentation

## 2017-11-08 LAB — TSH: TSH: 5.568 u[IU]/mL — ABNORMAL HIGH (ref 0.350–4.500)

## 2017-11-12 ENCOUNTER — Encounter: Payer: Self-pay | Admitting: Internal Medicine

## 2017-11-12 ENCOUNTER — Non-Acute Institutional Stay (SKILLED_NURSING_FACILITY): Payer: Medicare Other | Admitting: Internal Medicine

## 2017-11-12 DIAGNOSIS — R7989 Other specified abnormal findings of blood chemistry: Secondary | ICD-10-CM

## 2017-11-12 NOTE — Progress Notes (Signed)
+ 1 this is an acute visit.  Level of care skilled.  Facility is [facility].  Chief complaint-acute visit follow-up elevated TSH.  History of present illness.  Patient is a pleasant 84 year old female who was seen recently for routine visit- she has a history of  Dementia as well as acute cholecystitis status post drain- as well as a distant history of a cervical neck fracture.  .  She does not have a listed history of hypothyroidism however--recent labs have showed an elevated TSH last month TSH was mildly elevated at 4.747-updated one done 4 days ago shows increased elevation of 5.568.  Vital signs have been stable her weight  Appears to be relatively stable looks like she gained about 3 pounds over the past month1  She has no complaints today is sitting comfortably in her chair which is her baseline  Past Medical History:  Diagnosis Date  . Breast cancer ([place])   . Cognitive communication deficit   . Dementia   . Difficulty in walking(719.7)   . Fall   . Muscle weakness (generalized)   . Urinary tract infection, site not specified   . Vitamin B deficiency         Past Surgical History:  Procedure Laterality Date  . COLON SURGERY    . IR CATHETER TUBE CHANGE  05/07/2017  . IR EXCHANGE BILIARY DRAIN  07/02/2017  . IR EXCHANGE BILIARY DRAIN  07/10/2017  . IR EXCHANGE BILIARY DRAIN  08/28/2017  . IR EXCHANGE BILIARY DRAIN  10/11/2017  . IR EXCHANGE BILIARY DRAIN  10/28/2017  . IR PERC CHOLECYSTOSTOMY  01/27/2017  . IR RADIOLOGIST EVAL & MGMT  03/05/2017  . MASTECTOMY, PARTIAL Right     No Known Allergies         Medication Sig  . acetaminophen (TYLENOL) 325 MG tablet Take 650 mg by mouth every 4 (four) hours as needed.  . calcium carbonate (TUMS) 500 MG chewable tablet Chew 1 tablet by mouth daily.  . Cholecalciferol 1000 units tablet Take 1,000 Units by mouth at bedtime.   . Cyanocobalamin (VITAMIN B-12) 1000 MCG SUBL Place 1 tablet under  the tongue at bedtime.  . donepezil (ARICEPT) 10 MG tablet Take 1 tablet by mouth at bedtime.  . Sodium Chloride Flush (NORMAL SALINE FLUSH) 0.9 % SOLN Flush cholecystostomy tube daily with 5 cc  Once a day   No facility-administered encounter medications on file as of 10/30/2017.      Review of Systems this is limited secondary to dementia please see HPI-   She does not complain of any abdominal discomfort shortness of breath chest pain today continues to sit comfortably in her wheelchair she does ambulate often in the hallway when she goes to activities      Immunization History  Administered Date(s) Administered  . Influenza-Unspecified 07/28/2014, 07/20/2016, 07/26/2017  . Pneumococcal Conjugate-13 06/21/2017  . Pneumococcal-Unspecified 08/01/2016  . Tdap 07/11/2017       Pertinent  Health Maintenance Due  Topic Date Due  . INFLUENZA VACCINE  Completed  . DEXA SCAN  Completed  . PNA vac Low Risk Adult  Completed   Fall Risk  07/11/2017  Falls in the past year? No   Functional Status Survey:  + Review of systems.  Limited secondary to dementia e does not complain of any shortness of breath chest pain there has been some very mild weight gain   physical exam.  She is afebrile pulse is 72 respirations of 18-blood pressure 128/60  general this is a pleasant elderly female in no distress sitting in chair.  Her skin is warm and dry.  Chest is clear to auscultation there is no labored breathing.  Heart is regular rate and rhythm without murmur gallop or rub continues with fairly minimal lower extremity edema.  Abdomen is obese soft nontender with positive bowel sounds she has a drain in the bag on her right leg -- drain right side abdomen appears unremarkable.  Musculoskeletal moves all extremities at baseline ambulatory with a walker.  Neurologic she is grossly intact she is alert and cranial nerves appear grossly intact.  Psych she is oriented to self  pleasant cooperative with exam follow simple verbal commands without difficulty  Labs.  Nov 08 2017  TSH-5.568.  September 24, 2017.  TSH 4.747   October 10, 2017  Sodium 139 potassium 3.9 BUN 21 creatinine 1.07  October 09, 2017.  WBC 7.0 hemoglobin 13.2 platelets 250  Assessment and plan.  1.  Elevated TSH with no  history of hypothyroidism- will obtain a free T3 and T4 for further investigation-clinically she appears relatively asymptomatic-but will update labs and make medication decisions based on lab-this was discussed with Dr. Lyndel Safe     CPT- [phone]

## 2017-11-13 ENCOUNTER — Encounter (HOSPITAL_COMMUNITY)
Admission: RE | Admit: 2017-11-13 | Discharge: 2017-11-13 | Disposition: A | Discharge status: Home / Self Care | Payer: Medicare Other | Source: Skilled Nursing Facility | Attending: Internal Medicine | Admitting: Internal Medicine

## 2017-11-13 DIAGNOSIS — Z9181 History of falling: Secondary | ICD-10-CM | POA: Insufficient documentation

## 2017-11-13 DIAGNOSIS — Z978 Presence of other specified devices: Secondary | ICD-10-CM | POA: Insufficient documentation

## 2017-11-13 DIAGNOSIS — F0390 Unspecified dementia without behavioral disturbance: Secondary | ICD-10-CM | POA: Insufficient documentation

## 2017-11-13 DIAGNOSIS — E538 Deficiency of other specified B group vitamins: Secondary | ICD-10-CM | POA: Diagnosis not present

## 2017-11-13 DIAGNOSIS — E039 Hypothyroidism, unspecified: Secondary | ICD-10-CM | POA: Insufficient documentation

## 2017-11-14 LAB — T3, FREE: T3, Free: 2.4 pg/mL (ref 2.0–4.4)

## 2017-11-14 LAB — T4, FREE: Free T4: 0.74 ng/dL (ref 0.61–1.12)

## 2017-12-06 ENCOUNTER — Inpatient Hospital Stay (HOSPITAL_COMMUNITY): Admit: 2017-12-06 | Payer: Medicare Other

## 2017-12-12 ENCOUNTER — Encounter: Payer: Self-pay | Admitting: Internal Medicine

## 2017-12-12 ENCOUNTER — Non-Acute Institutional Stay (SKILLED_NURSING_FACILITY): Payer: Medicare Other | Admitting: Internal Medicine

## 2017-12-12 DIAGNOSIS — Z434 Encounter for attention to other artificial openings of digestive tract: Secondary | ICD-10-CM | POA: Diagnosis not present

## 2017-12-12 DIAGNOSIS — F0390 Unspecified dementia without behavioral disturbance: Secondary | ICD-10-CM

## 2017-12-12 NOTE — Progress Notes (Signed)
Location:   [facility] Room Number: 141/D Place of Service:  SNF (31) Provider:  Clydene Fake, MD  Patient Care Team: Virgie Dad, MD as PCP - General (Internal Medicine) Rolm Baptise as Physician Assistant (Internal Medicine)  Extended Emergency Contact Information Primary Emergency Contact: [name], [place] [address] [place] of [place] Phone: [phone] Relation: Friend Secondary Emergency Contact: Eimers,[name]  [place] Phone: [phone] Relation: Daughter  Code Status:  Full Code Goals of care: Advanced Directive information Advanced Directives 12/12/2017  Does Patient Have a Medical Advance Directive? Yes  Type of Advance Directive (No Data)  Does patient want to make changes to medical advance directive? No - Patient declined  Copy of [place] in Chart? -  Would patient like information on creating a medical advance directive? No - Patient declined     Chief Complaint  Patient presents with  . Medical Management of Chronic Issues    Routine Visit    HPI:  Pt is a 84 y.o. female seen today for medical management of chronic diseases.   Patient is Long term resident of facility  Patient has h/o Breast cancer s/p Modified Mastectomy, S/P right hemi colectomy and past history of C2 fracture. Patientwas diagnosed with AcuteCholecystitisin 04/18. Since she was considered high risk for surgery she had percutaneous cholecystostomy drain placement 01/27/17 by IR. Her Biliary Drain was changed by IR on 10/28/17 Patient c/o having the drain but otherwise is stable. Drain is still draining. She is stable. Walks with the walker in facility Weight is gone up from 143 to 149 lbs No New Nursing issues  Past Medical History:  Diagnosis Date  . Breast cancer ([place])   . Cognitive communication deficit   . Dementia   . Difficulty in  walking(719.7)   . Fall   . Muscle weakness (generalized)   . Urinary tract infection, site not specified   . Vitamin B deficiency    Past Surgical History:  Procedure Laterality Date  . COLON SURGERY    . IR CATHETER TUBE CHANGE  05/07/2017  . IR EXCHANGE BILIARY DRAIN  07/02/2017  . IR EXCHANGE BILIARY DRAIN  07/10/2017  . IR EXCHANGE BILIARY DRAIN  08/28/2017  . IR EXCHANGE BILIARY DRAIN  10/11/2017  . IR EXCHANGE BILIARY DRAIN  10/28/2017  . IR PERC CHOLECYSTOSTOMY  01/27/2017  . IR RADIOLOGIST EVAL & MGMT  03/05/2017  . MASTECTOMY, PARTIAL Right     No Known Allergies  Outpatient Encounter Medications as of 12/12/2017  Medication Sig  . acetaminophen (TYLENOL) 325 MG tablet Take 650 mg by mouth every 4 (four) hours as needed.  . calcium carbonate (TUMS) 500 MG chewable tablet Chew 1 tablet by mouth daily.  . Cholecalciferol 1000 units tablet Take 1,000 Units by mouth at bedtime.   . Cyanocobalamin (VITAMIN B-12) 1000 MCG SUBL Place 1 tablet under the tongue at bedtime.  . donepezil (ARICEPT) 10 MG tablet Take 1 tablet by mouth at bedtime.  . Sodium Chloride Flush (NORMAL SALINE FLUSH) 0.9 % SOLN Flush cholecystostomy tube daily with 5 cc  Once a day   No facility-administered encounter medications on file as of 12/12/2017.      Review of Systems  Review of Systems  Constitutional: Negative for activity change, appetite change, chills, diaphoresis, fatigue and fever.  HENT: Negative for mouth sores, postnasal drip, rhinorrhea, sinus  pain and sore throat.   Respiratory: Negative for apnea, cough, chest tightness, shortness of breath and wheezing.   Cardiovascular: Negative for chest pain, palpitations and leg swelling.  Gastrointestinal: Negative for abdominal distention, abdominal pain, constipation, diarrhea, nausea and vomiting.  Genitourinary: Negative for dysuria and frequency.  Musculoskeletal: Negative for arthralgias, joint swelling and myalgias.  Skin: Negative for rash.    Neurological: Negative for dizziness, syncope, weakness, light-headedness and numbness.  Psychiatric/Behavioral: Negative for behavioral problems, confusion and sleep disturbance.     Immunization History  Administered Date(s) Administered  . Influenza-Unspecified 07/28/2014, 07/20/2016, 07/26/2017  . Pneumococcal Conjugate-13 06/21/2017  . Pneumococcal-Unspecified 08/01/2016  . Tdap 07/11/2017   Pertinent  Health Maintenance Due  Topic Date Due  . INFLUENZA VACCINE  Completed  . DEXA SCAN  Completed  . PNA vac Low Risk Adult  Completed   Fall Risk  07/11/2017  Falls in the past year? No   Functional Status Survey:    Vitals:   12/12/17 1532  BP: 128/72  Pulse: 88  Resp: 20  Temp: (!) 97.1 F (36.2 C)  TempSrc: Oral  SpO2: 96%  Weight: 149 lb (67.6 kg)  Height: 5\' 3"  (1.6 m)   Body mass index is 26.39 kg/m. Physical Exam  Constitutional: She appears well-developed and well-nourished.  HENT:  Head: Normocephalic.  Mouth/Throat: Oropharynx is clear and moist.  Eyes: Pupils are equal, round, and reactive to light.  Neck: Neck supple.  Cardiovascular: Normal rate and normal heart sounds.  Pulmonary/Chest: Effort normal and breath sounds normal. No respiratory distress. She has no wheezes. She has no rales.  Abdominal: Soft. Bowel sounds are normal. She exhibits no distension. There is no tenderness. There is no rebound.  Drain in place  Musculoskeletal: She exhibits no edema.  Skin: Skin is warm and dry.  Psychiatric: She has a normal mood and affect. Her behavior is normal.    Labs reviewed: Recent Labs    10/05/17 1506 10/09/17 0910 10/10/17 1240  NA 141 139 139  K 4.6 5.4* 3.9  CL 108 106 104  CO2 27 22 25   GLUCOSE 99 93 100*  BUN 19 16 21*  CREATININE 0.86 0.89 1.07*  CALCIUM 9.1 9.1 9.0   Recent Labs    07/31/17 0700 10/05/17 1506 10/09/17 0910  AST 20 19 34  ALT 21 19 22   ALKPHOS 84 84 76  BILITOT 0.6 0.2* 0.8  PROT 7.1 7.3 7.5   ALBUMIN 3.4* 3.6 3.5   Recent Labs    04/05/17 0720 07/31/17 0700 10/05/17 1506 10/09/17 0910  WBC 6.7 7.4 6.4 7.0  NEUTROABS 4.3  --  3.8 4.2  HGB 11.6* 13.1 12.9 13.2  HCT 36.6 41.0 40.2 41.5  MCV 92.2 93.4 95.9 95.0  PLT 268 276 252 250   Lab Results  Component Value Date   TSH 5.568 (H) 11/08/2017   No results found for: HGBA1C No results found for: CHOL, HDL, LDLCALC, LDLDIRECT, TRIG, CHOLHDL  Significant Diagnostic Results in last 30 days:  No results found.  Assessment/Plan   S/P percutaneous cholecystostomy drain placement Per radiology her cystic Duct is blocked and she will need Drain for long time aith exchange every 2 months. Patient had a repeat CAT scan done in the emergency room and did not show any acute changes.  Her liver enzymes have been normal.  She has been gaining weight and has been largely asymptomatic. Dementia She is on Aricept and supportive care  Mildly elevated TSH But T3  and T4 is normal   Family/ staff Communication:   Labs/tests ordered:    Total time spent in this patient care encounter was 25_ minutes; greater than 50% of the visit spent counseling patient, reviewing records , Labs and coordinating care for problems addressed at this encounter.

## 2017-12-23 ENCOUNTER — Encounter (HOSPITAL_COMMUNITY): Payer: Self-pay | Admitting: Diagnostic Radiology

## 2017-12-23 ENCOUNTER — Ambulatory Visit (HOSPITAL_COMMUNITY)
Admit: 2017-12-23 | Discharge: 2017-12-23 | Disposition: A | Discharge status: Home / Self Care | Payer: Medicare Other | Source: Ambulatory Visit | Attending: Interventional Radiology | Admitting: Interventional Radiology

## 2017-12-23 ENCOUNTER — Inpatient Hospital Stay (HOSPITAL_COMMUNITY): Admit: 2017-12-23 | Payer: Medicare Other

## 2017-12-23 DIAGNOSIS — Z4682 Encounter for fitting and adjustment of non-vascular catheter: Secondary | ICD-10-CM | POA: Insufficient documentation

## 2017-12-23 DIAGNOSIS — K8040 Calculus of bile duct with cholecystitis, unspecified, without obstruction: Secondary | ICD-10-CM | POA: Diagnosis not present

## 2017-12-23 HISTORY — PX: IR EXCHANGE BILIARY DRAIN: IMG6046

## 2017-12-23 MED ORDER — LIDOCAINE HCL (PF) 1 % IJ SOLN
INTRAMUSCULAR | Status: DC | PRN
  Administered 2017-12-23: 8 mL

## 2017-12-23 MED ORDER — LIDOCAINE HCL 1 % IJ SOLN
INTRAMUSCULAR | Status: AC
  Filled 2017-12-23: qty 20

## 2017-12-23 MED ORDER — IOPAMIDOL (ISOVUE-300) INJECTION 61%
INTRAVENOUS | Status: AC
  Administered 2017-12-23: 20 mL
  Filled 2017-12-23: qty 50

## 2017-12-23 NOTE — Procedures (Signed)
Drain injection demonstrated gallstones. Patent cystic duct and CBD.  New 10 Fr drain placed.  Will try to manage tube with drain capped.  Instructions written out for nursing home. No immediate complication and no blood loss.

## 2018-01-01 ENCOUNTER — Encounter: Payer: Self-pay | Admitting: Internal Medicine

## 2018-01-01 NOTE — Progress Notes (Signed)
This encounter was created in error - please disregard.

## 2018-01-01 NOTE — Progress Notes (Unsigned)
This is a routine visit.  Level of care skilled.  Facility is [facility].  Chief complaint-routine visit for medical management of chronic medical issues including cholecystitis- history of cervical C2 fracture- status post right hemicolectomy-history of breast cancer status post modified mastectomy-dementia-as well as elevated TSH.  History of present illness.  Patient is a 84 year old female with the above diagnoses--she appears to be quite stable and has no complaints today which is her baseline.  Back in April 2018 she was diagnosed with acute cholecystitis-she was thought to be at high risk for surgery and had a percutaneous colostomy drain placed by interventional radiology- her papillary drain was changed by interventional radiology in January- and they will be following this as well.  She still complains of having the drain but otherwise has no complaints-drainage now is quite minimal.  She continues to be stable otherwise and walks with a walker in the facility.  .  Regards to her other issues these appear to be stable she is on minimal medications continues on Aricept for dementia- he is also on B12 supplementation as well as vitamin D supplementation-  Back in January she did have an elevated mildly TSH at 5.568 however T3 and T4 were within normal range  Past Medical History:  Diagnosis Date  . Breast cancer ([place])   . Cognitive communication deficit   . Dementia   . Difficulty in walking(719.7)   . Fall   . Muscle weakness (generalized)   . Urinary tract infection, site not specified   . Vitamin B deficiency         Past Surgical History:  Procedure Laterality Date  . COLON SURGERY    . IR CATHETER TUBE CHANGE  05/07/2017  . IR EXCHANGE BILIARY DRAIN  07/02/2017  . IR EXCHANGE BILIARY DRAIN  07/10/2017  . IR EXCHANGE BILIARY DRAIN  08/28/2017  . IR EXCHANGE BILIARY DRAIN  10/11/2017  . IR EXCHANGE BILIARY DRAIN  10/28/2017  . IR PERC  CHOLECYSTOSTOMY  01/27/2017  . IR RADIOLOGIST EVAL & MGMT  03/05/2017  . MASTECTOMY, PARTIAL Right     No Known Allergies    MEDICATIONS        Sig  . acetaminophen (TYLENOL) 325 MG tablet Take 650 mg by mouth every 4 (four) hours as needed.  . calcium carbonate (TUMS) 500 MG chewable tablet Chew 1 tablet by mouth daily.  . Cholecalciferol 1000 units tablet Take 1,000 Units by mouth at bedtime.   . Cyanocobalamin (VITAMIN B-12) 1000 MCG SUBL Place 1 tablet under the tongue at bedtime.  . donepezil (ARICEPT) 10 MG tablet Take 1 tablet by mouth at bedtime.  . Sodium Chloride Flush (NORMAL SALINE FLUSH) 0.9 % SOLN Flush cholecystostomy tube daily with 5 cc  Once a day   No facility-administered encounter medications on file as of 12/12/2017.    Review of systems this is limited secondary to dementia.  In general she is not complaining any fever or chills.  Skin does not complain of rashes or itching does complain of the drain on the right side of her abdomen but does not appear to be painful just bothersome for her.  Head ears eyes nose mouth and throat has prescription lenses did not complain of visual changes or sore throat.  Respiratory is not complaining shortness of breath or cough.  Cardiac denies chest pain.  GI is not complaining of abdominal discomfort other than being bothered with the drain-does not complain of nausea vomiting diarrhea constipation.  GU does not complain of dysuria.  Musculoskeletal is not complaining of joint pain ambulates about facility in her walker.  Neurologic does not complain of dizziness headache or syncope.  And psych is not complaining of overt anxiety or depression does have dementia with cognitive deficits.  Physical exam.  She is afebrile pulse of 68-respirations of 18- blood pressure taken manually 150/80.  In general this is a pleasant elderly female in no distress sitting comfortably in a wheelchair.  Her skin is warm and  dry.  Eyes visual acuity appears intact she has prescription lenses sclera and conjunctive are clear.  Oropharynx is clear mucous membranes moist.  Chest is clear to auscultation there is no labored breathing.  Abdomen is soft nontender with positive bowel sounds she has a well-healed midline surgical scar-drain is in place in the right side of the abdomen this appears unremarkable without any drainage or bleeding drainage is quite minimal per nursing staff.  Musculoskeletal is able to move all extremities x4 at baseline with baseline strength is able to stand without assistance and use her walker.  Neurologic is grossly intact her speech is clear no lateralizing findings.  Psych she is oriented to self can carry on short conversations.   Assessment and plan   #1---history cholelith the ASIS-again her cystic duct is thought to be blocked and will need to drain for an extended period of time this is followed by interventional radiology and exchanged on periodic basis this appears to be stable without complaints of pain.  2.  History of dementia she is doing well with supportive care she is on Aricept.  3.  History of mildly elevated TSH again T3 and T4 were within normal range will update a TSH.  4.  History of B12 deficiency this is supplemented B12 level recently in December 2018 was within normal limits.  5.  History of vitamin D deficiency this appears stable as well December labs showed a vitamin D level 34.1.  6.  History of C2 fracture at one point she had an Aspen collar this is been discontinued and appears to be stable she does not complain of neck pain or neurologic issues.  7.  History of breast carcinoma --patient has declined further workup of this.  8.  History of anemia?  This appears to be stable with hemoglobin 13.2 on lab done in December-  #9-question hypertension systolic is somewhat elevated at 150-today will order blood pressure checks daily for the next week  to keep an eye on this.  Of note will update a CBC and BMP for updated values in addition to the TSH.  Renal function appears to be relatively stable but creatinine was a little bit higher than previous levels at 1.07 back in December again this was quite marginal but will update this to ensure no large changes over time.  [id number]

## 2018-01-02 ENCOUNTER — Encounter (HOSPITAL_COMMUNITY)
Admission: RE | Admit: 2018-01-02 | Discharge: 2018-01-02 | Disposition: A | Discharge status: Home / Self Care | Payer: Medicare Other | Source: Skilled Nursing Facility | Attending: Internal Medicine | Admitting: Internal Medicine

## 2018-01-02 DIAGNOSIS — E039 Hypothyroidism, unspecified: Secondary | ICD-10-CM | POA: Diagnosis not present

## 2018-01-02 LAB — CBC WITH DIFFERENTIAL/PLATELET
Basophils Absolute: 0 10*3/uL (ref 0.0–0.1)
Basophils Relative: 1 %
Eosinophils Absolute: 0.3 10*3/uL (ref 0.0–0.7)
Eosinophils Relative: 5 %
HCT: 35.3 % — ABNORMAL LOW (ref 36.0–46.0)
Hemoglobin: 11.3 g/dL — ABNORMAL LOW (ref 12.0–15.0)
Lymphocytes Relative: 33 %
Lymphs Abs: 2.2 10*3/uL (ref 0.7–4.0)
MCH: 30.7 pg (ref 26.0–34.0)
MCHC: 32 g/dL (ref 30.0–36.0)
MCV: 95.9 fL (ref 78.0–100.0)
Monocytes Absolute: 0.7 10*3/uL (ref 0.1–1.0)
Monocytes Relative: 11 %
Neutro Abs: 3.4 10*3/uL (ref 1.7–7.7)
Neutrophils Relative %: 50 %
Platelets: 228 10*3/uL (ref 150–400)
RBC: 3.68 MIL/uL — ABNORMAL LOW (ref 3.87–5.11)
RDW: 14.9 % (ref 11.5–15.5)
WBC: 6.6 10*3/uL (ref 4.0–10.5)

## 2018-01-02 LAB — BASIC METABOLIC PANEL
Anion gap: 9 (ref 5–15)
BUN: 17 mg/dL (ref 6–20)
CO2: 26 mmol/L (ref 22–32)
Calcium: 8.9 mg/dL (ref 8.9–10.3)
Chloride: 108 mmol/L (ref 101–111)
Creatinine, Ser: 0.86 mg/dL (ref 0.44–1.00)
GFR calc Af Amer: >60 mL/min (ref >60)
GFR calc non Af Amer: 58 mL/min — ABNORMAL LOW (ref >60)
Glucose, Bld: 91 mg/dL (ref 65–99)
Potassium: 4.2 mmol/L (ref 3.5–5.1)
Sodium: 143 mmol/L (ref 135–145)

## 2018-01-02 LAB — TSH: TSH: 3.877 u[IU]/mL (ref 0.350–4.500)

## 2018-01-09 ENCOUNTER — Non-Acute Institutional Stay (SKILLED_NURSING_FACILITY): Payer: Medicare Other | Admitting: Internal Medicine

## 2018-01-09 ENCOUNTER — Encounter: Payer: Self-pay | Admitting: Internal Medicine

## 2018-01-09 DIAGNOSIS — F0390 Unspecified dementia without behavioral disturbance: Secondary | ICD-10-CM | POA: Diagnosis not present

## 2018-01-09 DIAGNOSIS — K8001 Calculus of gallbladder with acute cholecystitis with obstruction: Secondary | ICD-10-CM | POA: Diagnosis not present

## 2018-01-09 DIAGNOSIS — R7989 Other specified abnormal findings of blood chemistry: Secondary | ICD-10-CM | POA: Diagnosis not present

## 2018-01-09 DIAGNOSIS — S12191D Other nondisplaced fracture of second cervical vertebra, subsequent encounter for fracture with routine healing: Secondary | ICD-10-CM | POA: Diagnosis not present

## 2018-01-09 DIAGNOSIS — C50911 Malignant neoplasm of unspecified site of right female breast: Secondary | ICD-10-CM

## 2018-01-09 NOTE — Progress Notes (Signed)
Location:   [facility] Room Number: 141/D Place of Service:  SNF (31) Provider:  Freddi Starr, MD  Patient Care Team: Virgie Dad, MD as PCP - General (Internal Medicine) Rolm Baptise as Physician Assistant (Internal Medicine)  Extended Emergency Contact Information Primary Emergency Contact: [name], [place] [address] [place] Phone: [phone] Relation: Friend Secondary Emergency Contact: Stairs,[name]  [place] Phone: [phone] Relation: Daughter  Code Status:  Full Code Goals of care: Advanced Directive information Advanced Directives 01/09/2018  Does Patient Have a Medical Advance Directive? Yes  Type of Advance Directive (No Data)  Does patient want to make changes to medical advance directive? No - Patient declined  Copy of [place] in Chart? -  Would patient like information on creating a medical advance directive? No - Patient declined     Chief Complaint  Patient presents with  . Medical Management of Chronic Issues    Routine Visit   For medical management of chronic medical conditions including HPI:  Pt is a 84 y.o. female seen today for medical management of chronic diseases.  Including patient history of acute cholecystitis back in April 2018 as well as status post right hemicolectomy with past history C2 fracture as well as well as a history of breast cancer status post modified mastectomy- also history of dementia.  She has been quite stable most recent acute issue was acute cholecystitis back in April 2018 she was considered a high risk for surgery and instead had a percutaneous cholecystostomy drain placed by interventional radiology her biliary drain was changed by IR in January of this year.  Patient is somewhat irritated with having the drain but otherwise is doing well and apparently the drainage now is quite  minimal.  She continues to walk around facility in her walker-her weight appears to be relatively stable now at 149 pounds I believe with the cholecystitis she did lose some weight but she has gained this back.  She has no complaints today her vital signs are stable.     Past Medical History:  Diagnosis Date  . Breast cancer ([place])   . Cognitive communication deficit   . Dementia   . Difficulty in walking(719.7)   . Fall   . Muscle weakness (generalized)   . Urinary tract infection, site not specified   . Vitamin B deficiency    Past Surgical History:  Procedure Laterality Date  . COLON SURGERY    . IR CATHETER TUBE CHANGE  05/07/2017  . IR EXCHANGE BILIARY DRAIN  07/02/2017  . IR EXCHANGE BILIARY DRAIN  07/10/2017  . IR EXCHANGE BILIARY DRAIN  08/28/2017  . IR EXCHANGE BILIARY DRAIN  10/11/2017  . IR EXCHANGE BILIARY DRAIN  10/28/2017  . IR EXCHANGE BILIARY DRAIN  12/23/2017  . IR PERC CHOLECYSTOSTOMY  01/27/2017  . IR RADIOLOGIST EVAL & MGMT  03/05/2017  . MASTECTOMY, PARTIAL Right     No Known Allergies  Outpatient Encounter Medications as of 01/09/2018  Medication Sig  . acetaminophen (TYLENOL) 325 MG tablet Take 650 mg by mouth every 4 (four) hours as needed.  . calcium carbonate (TUMS) 500 MG chewable tablet Chew 1 tablet by mouth daily.  . Cholecalciferol 1000 units tablet Take 1,000 Units by mouth at bedtime.   . Cyanocobalamin (VITAMIN B-12) 1000 MCG SUBL Place 1 tablet under the tongue at bedtime.  Marland Kitchen  donepezil (ARICEPT) 10 MG tablet Take 1 tablet by mouth at bedtime.  . Sodium Chloride Flush (NORMAL SALINE FLUSH) 0.9 % SOLN Flush cholecystostomy tube daily with 5 cc  Once a day   No facility-administered encounter medications on file as of 01/09/2018.      Review of Systems   This is limited secondary to dementia.  In general she is not complaining of fever chills says she feels well today her weight appears to be stabilized.  Skin does not complain of rashes or  itching does have a drain site right side of her abdomen.  Head ears eyes nose mouth and throat she has prescription lenses does not complain of visual changes or sore throat or difficulty swallowing.  Respiratory is not complaining of any shortness of breath or cough.  Cardiac denies any chest pain does not really have significant lower extremity edema.  GU does not complain of dysuria.  Musculoskeletal is not complaining of joint pain again ambulates with a walker about facility.  Neurologic does not complain of dizziness headache syncope.  In psych does not complain of overt anxiety or depression she does have some history of dementia but does well with supportive care  Immunization History  Administered Date(s) Administered  . Influenza-Unspecified 07/28/2014, 07/20/2016, 07/26/2017  . Pneumococcal Conjugate-13 06/21/2017  . Pneumococcal-Unspecified 08/01/2016  . Tdap 07/11/2017   Pertinent  Health Maintenance Due  Topic Date Due  . INFLUENZA VACCINE  Completed  . DEXA SCAN  Completed  . PNA vac Low Risk Adult  Completed   Fall Risk  07/11/2017  Falls in the past year? No   Functional Status Survey:    Vitals:   01/09/18 1257  BP: 124/66  Pulse: 88  Resp: 20  Temp: (!) 97.1 F (36.2 C)  TempSrc: Oral  SpO2: 96%  Weight: 149 lb 12.8 oz (67.9 kg)  Height: 5\' 3"  (1.6 m)   Body mass index is 26.54 kg/m. Physical Exam In general this is a pleasant elderly female no distress.  Her skin is warm and dry she does have a drain placed in the right abdomen it is currently covered I do not see any evidence of surrounding erythema or drainage.  Eyes pupils appear to be reactive to light sclera are clear she has prescription lenses visual acuity appears to be intact.  Oropharynx clear mucous membranes moist.  Chest is clear to auscultation there is no labored breathing.  Heart is regular rate and rhythm without gallop or rub she has quite minimal lower extremity edema  bilaterally.  Abdomen is soft nontender with positive bowel sounds drain is in place right side of the abdomen.  Neurologic is grossly intact her speech is clear I could not really appreciate lateralizing findings.  Psych she is pleasant and appropriate does have some mild cognitive deficits but does quite well with supportive care and has no problem following verbal commands.   Labs reviewed: Recent Labs    10/09/17 0910 10/10/17 1240 01/02/18 0415  NA 139 139 143  K 5.4* 3.9 4.2  CL 106 104 108  CO2 22 25 26   GLUCOSE 93 100* 91  BUN 16 21* 17  CREATININE 0.89 1.07* 0.86  CALCIUM 9.1 9.0 8.9   Recent Labs    07/31/17 0700 10/05/17 1506 10/09/17 0910  AST 20 19 34  ALT 21 19 22   ALKPHOS 84 84 76  BILITOT 0.6 0.2* 0.8  PROT 7.1 7.3 7.5  ALBUMIN 3.4* 3.6 3.5   Recent  Labs    10/05/17 1506 10/09/17 0910 01/02/18 0415  WBC 6.4 7.0 6.6  NEUTROABS 3.8 4.2 3.4  HGB 12.9 13.2 11.3*  HCT 40.2 41.5 35.3*  MCV 95.9 95.0 95.9  PLT 252 250 228   Lab Results  Component Value Date   TSH 3.877 01/02/2018   No results found for: HGBA1C No results found for: CHOL, HDL, LDLCALC, LDLDIRECT, TRIG, CHOLHDL  Significant Diagnostic Results in last 30 days:  Ir Exchange Biliary Drain  Result Date: 12/23/2017 INDICATION: 84 year old with history of cholecystitis and cholecystostomy tube. Patient presents for routine exchange. EXAM: CHOLECYSTOSTOMY TUBE EXCHANGE WITH FLUOROSCOPY MEDICATIONS: None ANESTHESIA/SEDATION: None FLUOROSCOPY TIME:  Fluoroscopy Time: 1 minutes, 3.8 mGy COMPLICATIONS: None immediate. PROCEDURE: Informed written consent was obtained from the patient after a thorough discussion of the procedural risks, benefits and alternatives. All questions were addressed. Maximal Sterile Barrier Technique was utilized including caps, mask, sterile gowns, sterile gloves, sterile drape, hand hygiene and skin antiseptic. A timeout was performed prior to the initiation of the  procedure. The drain was injected with contrast. The retention suture was removed. Catheter was cut and removed over a Bentson wire. New 10 French drain was placed over the wire and reconstituted in the gallbladder. Skin was anesthetized with 1% lidocaine. Drain was sutured to the skin. Drain was flushed with sterile saline and capped. Fluoroscopic images were taken and saved for this procedure. FINDINGS: The gallbladder drain was well positioned in the gallbladder with multiple stones. The new drain is also well positioned within the gallbladder. Cystic duct and common bile duct are patent. Contrast was draining into the duodenum. IMPRESSION: Successful exchange of the percutaneous cholecystostomy tube with fluoroscopy. Patient is scheduled for another routine exchange on February 17, 2018. Patient was complaining of the gravity bag and wanted to get rid of the bag if possible. The cystic duct and common bile duct are patent, therefore, we will try to manage the tube capped. I wrote out instructions for the nursing facility to flush the catheter every other day. The instructions also explained that if the patient complains of fevers, pain or leakage around the tube, then they should remove the cap and attach the drain to the gravity bag. Electronically Signed   By: Markus Daft M.D.   On: 12/23/2017 14:10    Assessment/Plan  #1-history of cholecystitis status post percutaneous cholecystotomy drain placement-she appears to be tolerating this well she is followed by interventional radiology for cystic duct is blocked will need to drain for a prolonged period of time with recommendation to exchange the drain every 2 months.  She did have a repeat scan done in the emergency room which did not show any acute changes her liver enzymes continue to be normal.  She is pretty much gained her weight back which is encouraging.  2.  History of mildly elevated TSH in the past we did do an update TSH which has normalized at  3.877- T3 back January was 2.4 T4 was 0.74 will monitor periodically.  3.  Dementia she appears to be stable on Aricept doing well with supportive care nursing does not report any behavioral issues.  4.:   --History of cervical C2 fracture distant past at one point has a cervical collar--this is long since been discontinued and she appears to be stable is not complaining of pain or neurologic changes.  5.  History of B12 deficiency she is on supplementation B12 level in December was 872.  6.  History of vitamin  D deficiency this appears stable on supplementation it was 34.1 on December lab.  7.  History of breast carcinoma in the past status post modified mastectomy patient has declined any further work-up.  8.  History of anemia this appears relatively stable with recent hemoglobin drawn last week was 11.3 this is lower end of recent baselines which appear range roughly from the 11-13's area--- Clinically she appears stable.   --[id number]

## 2018-01-09 NOTE — Progress Notes (Signed)
This encounter was created in error - please disregard.

## 2018-01-23 ENCOUNTER — Inpatient Hospital Stay (HOSPITAL_COMMUNITY)
Admission: RE | Admit: 2018-01-23 | Discharge: 2018-01-23 | DRG: 446 | Disposition: A | Discharge status: Home / Self Care | Payer: Medicare Other | Source: Ambulatory Visit | Attending: Interventional Radiology | Admitting: Interventional Radiology

## 2018-01-23 ENCOUNTER — Encounter (HOSPITAL_COMMUNITY): Payer: Self-pay | Admitting: Interventional Radiology

## 2018-01-23 DIAGNOSIS — K819 Cholecystitis, unspecified: Secondary | ICD-10-CM | POA: Diagnosis present

## 2018-01-23 HISTORY — PX: IR EXCHANGE BILIARY DRAIN: IMG6046

## 2018-01-23 MED ORDER — LIDOCAINE HCL (PF) 1 % IJ SOLN
INTRAMUSCULAR | Status: DC | PRN
  Administered 2018-01-23: 10 mL

## 2018-01-23 MED ORDER — SODIUM CHLORIDE 0.9% FLUSH: 5.0000 mL | Freq: Three times a day (TID) | INTRAVENOUS | Status: DC

## 2018-01-23 MED ORDER — LIDOCAINE HCL 1 % IJ SOLN
INTRAMUSCULAR | Status: AC
  Filled 2018-01-23: qty 20

## 2018-01-23 MED ORDER — IOPAMIDOL (ISOVUE-300) INJECTION 61%
INTRAVENOUS | Status: AC
  Administered 2018-01-23: 5 mL
  Filled 2018-01-23: qty 50

## 2018-01-23 NOTE — Procedures (Signed)
Interventional Radiology Procedure Note  Procedure: Routine exchange of perc chole tube. New 69F tube into GB to drain.   Findings: Mx retained stones in the GB. Cystic duct open, but there is filling defect at the distal CBD, which is concerning for cholodocholithiasis.  Drain was previously capped, with drainage at the skin site.  Given adequate position of the loop in the GB, the drainage at the skin, and the filling defect at the CBD, decision made to terminate capping trial and continue bag drainage.  .  Complications: None  Recommendations:  - to gravity drain - Do not submerge - Routine exchange of tubes indefinitely, if not a candidate for surgery.   Signed,  Dulcy Fanny. Earleen Newport, DO

## 2018-02-13 ENCOUNTER — Non-Acute Institutional Stay (SKILLED_NURSING_FACILITY): Payer: Medicare Other | Admitting: Internal Medicine

## 2018-02-13 DIAGNOSIS — R262 Difficulty in walking, not elsewhere classified: Secondary | ICD-10-CM | POA: Diagnosis not present

## 2018-02-13 DIAGNOSIS — F0390 Unspecified dementia without behavioral disturbance: Secondary | ICD-10-CM | POA: Diagnosis not present

## 2018-02-13 DIAGNOSIS — D649 Anemia, unspecified: Secondary | ICD-10-CM

## 2018-02-13 DIAGNOSIS — E538 Deficiency of other specified B group vitamins: Secondary | ICD-10-CM

## 2018-02-13 DIAGNOSIS — L603 Nail dystrophy: Secondary | ICD-10-CM | POA: Diagnosis not present

## 2018-02-13 DIAGNOSIS — S12191D Other nondisplaced fracture of second cervical vertebra, subsequent encounter for fracture with routine healing: Secondary | ICD-10-CM | POA: Diagnosis not present

## 2018-02-13 DIAGNOSIS — Z434 Encounter for attention to other artificial openings of digestive tract: Secondary | ICD-10-CM | POA: Diagnosis not present

## 2018-02-13 DIAGNOSIS — I739 Peripheral vascular disease, unspecified: Secondary | ICD-10-CM | POA: Diagnosis not present

## 2018-02-14 ENCOUNTER — Encounter (HOSPITAL_COMMUNITY)
Admission: RE | Admit: 2018-02-14 | Discharge: 2018-02-14 | Disposition: A | Discharge status: Home / Self Care | Payer: Medicare Other | Source: Skilled Nursing Facility | Attending: Internal Medicine | Admitting: Internal Medicine

## 2018-02-14 ENCOUNTER — Encounter: Payer: Self-pay | Admitting: Internal Medicine

## 2018-02-14 DIAGNOSIS — E559 Vitamin D deficiency, unspecified: Secondary | ICD-10-CM | POA: Insufficient documentation

## 2018-02-14 DIAGNOSIS — F0390 Unspecified dementia without behavioral disturbance: Secondary | ICD-10-CM | POA: Diagnosis not present

## 2018-02-14 DIAGNOSIS — D519 Vitamin B12 deficiency anemia, unspecified: Secondary | ICD-10-CM | POA: Diagnosis not present

## 2018-02-14 DIAGNOSIS — Z9181 History of falling: Secondary | ICD-10-CM | POA: Insufficient documentation

## 2018-02-14 DIAGNOSIS — R6889 Other general symptoms and signs: Secondary | ICD-10-CM | POA: Diagnosis not present

## 2018-02-14 DIAGNOSIS — Z978 Presence of other specified devices: Secondary | ICD-10-CM | POA: Insufficient documentation

## 2018-02-14 DIAGNOSIS — E039 Hypothyroidism, unspecified: Secondary | ICD-10-CM | POA: Insufficient documentation

## 2018-02-14 LAB — CBC WITH DIFFERENTIAL/PLATELET
Basophils Absolute: 0 10*3/uL (ref 0.0–0.1)
Basophils Relative: 0 %
Eosinophils Absolute: 0.4 10*3/uL (ref 0.0–0.7)
Eosinophils Relative: 6 %
HCT: 36.2 % (ref 36.0–46.0)
Hemoglobin: 11.5 g/dL — ABNORMAL LOW (ref 12.0–15.0)
Lymphocytes Relative: 32 %
Lymphs Abs: 2 10*3/uL (ref 0.7–4.0)
MCH: 30 pg (ref 26.0–34.0)
MCHC: 31.8 g/dL (ref 30.0–36.0)
MCV: 94.5 fL (ref 78.0–100.0)
Monocytes Absolute: 0.5 10*3/uL (ref 0.1–1.0)
Monocytes Relative: 9 %
Neutro Abs: 3.3 10*3/uL (ref 1.7–7.7)
Neutrophils Relative %: 53 %
Platelets: 230 10*3/uL (ref 150–400)
RBC: 3.83 MIL/uL — ABNORMAL LOW (ref 3.87–5.11)
RDW: 14.8 % (ref 11.5–15.5)
WBC: 6.3 10*3/uL (ref 4.0–10.5)

## 2018-02-14 LAB — VITAMIN B12: Vitamin B-12: 487 pg/mL (ref 180–914)

## 2018-02-14 NOTE — Progress Notes (Signed)
Location:  [facility] Room Number: 141/D Place of Service:  SNF (31) Provider:  Freddi Starr, MD  Patient Care Team: Virgie Dad, MD as PCP - General (Internal Medicine) Rolm Baptise as Physician Assistant (Internal Medicine)  Extended Emergency Contact Information Primary Emergency Contact: [name], [place] [address] [place] Phone: [phone] Relation: Friend Secondary Emergency Contact: Demetrius,[name]  [place] Phone: [phone] Relation: Daughter  Code Status:  Full Code Goals of care: Advanced Directive information Advanced Directives 01/09/2018  Does Patient Have a Medical Advance Directive? Yes  Type of Advance Directive (No Data)  Does patient want to make changes to medical advance directive? No - Patient declined  Copy of [place] in Chart? -  Would patient like information on creating a medical advance directive? No - Patient declined     Chief Complaint  Patient presents with  . Medical Management of Chronic Issues    Routine  Visit   For medical management of chronic medical conditions including history of cholecystitis status post drain-history of cervical C2 fracture-as well as distant history of breast cancer status post modified mastectomy also history of dementia  HPI:  Pt is a 84 y.o. female seen today for medical management of chronic diseases.  As noted above.  She continues to be quite stable- she does have a history of cholecystitis this was in April 2018 and was considered too high risk for surgery and had a percutaneous cholecystotomy drain placed by interventional radiology-she continues with this- and is followed by interventional radiology.  This appears to be stable although patient does express some irritation at times with.  Her weight continues to be stable nursing does not really report any acute  issues.     Past Medical History:  Diagnosis Date  . Breast cancer ([place])   . Cognitive communication deficit   . Dementia   . Difficulty in walking(719.7)   . Fall   . Muscle weakness (generalized)   . Urinary tract infection, site not specified   . Vitamin B deficiency    Past Surgical History:  Procedure Laterality Date  . COLON SURGERY    . IR CATHETER TUBE CHANGE  05/07/2017  . IR EXCHANGE BILIARY DRAIN  07/02/2017  . IR EXCHANGE BILIARY DRAIN  07/10/2017  . IR EXCHANGE BILIARY DRAIN  08/28/2017  . IR EXCHANGE BILIARY DRAIN  10/11/2017  . IR EXCHANGE BILIARY DRAIN  10/28/2017  . IR EXCHANGE BILIARY DRAIN  12/23/2017  . IR EXCHANGE BILIARY DRAIN  01/23/2018  . IR PERC CHOLECYSTOSTOMY  01/27/2017  . IR RADIOLOGIST EVAL & MGMT  03/05/2017  . MASTECTOMY, PARTIAL Right     No Known Allergies  Outpatient Encounter Medications as of 02/13/2018  Medication Sig  . acetaminophen (TYLENOL) 325 MG tablet Take 650 mg by mouth every 4 (four) hours as needed.  . calcium carbonate (TUMS) 500 MG chewable tablet Chew 1 tablet by mouth daily.  . Cholecalciferol 1000 units tablet Take 1,000 Units by mouth at bedtime.   . Cyanocobalamin (VITAMIN B-12) 1000 MCG SUBL Place 1 tablet under the tongue at bedtime.  . donepezil (ARICEPT) 10 MG tablet Take 1 tablet by mouth at bedtime.  . Sodium Chloride Flush (NORMAL SALINE FLUSH) 0.9 % SOLN Flush cholecystostomy tube daily with 5 cc  Once a day   No facility-administered encounter medications on file  as of 02/13/2018.     Review of Systems--is somewhat limited secondary to dementia provided by nursing as well.  In general is not complaining of fever chills her weight appears to be stable.  Skin does not complain of rashes itching does have a drain site persisting on the right side of her abdomen.  Head ears eyes nose mouth and throat does not complain of any sore throat or visual changes she does wear prescription lenses.  Respiratory does not  complain of feeling short of breath or having cough.  Cardiac does not complain of chest pain or increased lower extremity edema with [place] edema is trace.  GI is not complaining of abdominal discomfort nausea vomiting diarrhea constipation.  GU has not complained of dysuria.  Musculoskeletal does not complain of joint pain.  Neurologic is not complaining of feeling syncopal or having dizziness headache or numbness.  And psychological is not complaining of being anxious or depressed.    Immunization History  Administered Date(s) Administered  . Influenza-Unspecified 07/28/2014, 07/20/2016, 07/26/2017  . Pneumococcal Conjugate-13 06/21/2017  . Pneumococcal-Unspecified 08/01/2016  . Tdap 07/11/2017   Pertinent  Health Maintenance Due  Topic Date Due  . INFLUENZA VACCINE  05/22/2018  . DEXA SCAN  Completed  . PNA vac Low Risk Adult  Completed   Fall Risk  07/11/2017  Falls in the past year? No   Functional Status Survey:    Vitals:   02/14/18 1119  BP: 130/70  Pulse: 80  Resp: 17  She is afebrile weight is 149.5 pounds  Physical Exam  In general this is a pleasant elderly female in no distress sitting comfortably in her chair.  Her skin is warm and dry has the drain on the right side of her abdomen is a small amount of dark tan-colored drainage.  Eyes pupils appear reactive to light she has prescription lenses sclera and conjunctive are clear.  Visual acuity intact.  Oropharynx is clear mucous membranes moist.  Chest is clear to auscultation there is no labored breathing.  Heart is regular rate and rhythm without murmur gallop or rub she has trace lower extremity edema this appears relatively baseline.  Her abdomen is soft nontender with active bowel sounds again she does have the abdominal drain on the right.  Musculoskeletal is able to move all extremities x4 at baseline--- strength appears to be baseline all 4 extremities.  Neurologic is grossly intact  her speech is clear could not really see lateralizing findings.  Psych she is oriented to self pleasant and appropriate with some mild confusion does not really like to be bothered much.     Labs reviewed: Recent Labs    10/09/17 0910 10/10/17 1240 01/02/18 0415  NA 139 139 143  K 5.4* 3.9 4.2  CL 106 104 108  CO2 22 25 26   GLUCOSE 93 100* 91  BUN 16 21* 17  CREATININE 0.89 1.07* 0.86  CALCIUM 9.1 9.0 8.9   Recent Labs    07/31/17 0700 10/05/17 1506 10/09/17 0910  AST 20 19 34  ALT 21 19 22   ALKPHOS 84 84 76  BILITOT 0.6 0.2* 0.8  PROT 7.1 7.3 7.5  ALBUMIN 3.4* 3.6 3.5   Recent Labs    10/09/17 0910 01/02/18 0415 02/14/18 0732  WBC 7.0 6.6 6.3  NEUTROABS 4.2 3.4 3.3  HGB 13.2 11.3* 11.5*  HCT 41.5 35.3* 36.2  MCV 95.0 95.9 94.5  PLT 250 228 230   Lab Results  Component Value Date  TSH 3.877 01/02/2018   No results found for: HGBA1C No results found for: CHOL, HDL, LDLCALC, LDLDIRECT, TRIG, CHOLHDL  Significant Diagnostic Results in last 30 days:  Ir Exchange Biliary Drain  Result Date: 01/23/2018 INDICATION: 84 year old female with a history of prior cholecystitis. Percutaneous cholecystostomy performed 01/27/2017. She has remained on gravity drainage. The patient returns today with drainage at the skin site and possible displacement. EXAM: IMAGE GUIDED EXCHANGE OF PERCUTANEOUS CHOLECYSTOSTOMY MEDICATIONS: None ANESTHESIA/SEDATION: None FLUOROSCOPY TIME:  Fluoroscopy Time: 0 minutes 18 seconds (1.3 mGy). COMPLICATIONS: None PROCEDURE: Informed written consent was obtained from the patient after a thorough discussion of the procedural risks, benefits and alternatives. All questions were addressed. Maximal Sterile Barrier Technique was utilized including caps, mask, sterile gowns, sterile gloves, sterile drape, hand hygiene and skin antiseptic. A timeout was performed prior to the initiation of the procedure. Patient positioned supine position on the fluoroscopy  table. Scout images of the upper abdomen performed. 1% lidocaine was used for local anesthesia. Contrast infused confirmed position of the tube within the gallbladder. Subsequently modified Seldinger technique was used to exchange the indwelling tube for a new 10 [place] biliary drain. Drain was attached to gravity and sutured to the skin. Contrast injection performed. Patient tolerated the procedure well and remained hemodynamically stable throughout. No complications were encountered and no significant blood loss. FINDINGS: Initial image demonstrates pigtail catheter in the right upper quadrant. Surgical changes of the right abdomen. Injection confirms position within the gallbladder lumen. Through the tube cholangiogram demonstrates multiple retained gallstones within the gallbladder lumen. Cystic duct patent. Mild reflux into the intrahepatic ductal system. Filling defect at the distal common bile duct at the ampulla with contrast reaching the duodenum. No free flow identified. Gravity drain attached. IMPRESSION: Status post routine exchange of percutaneous cholecystostomy with placement of a new 70 French tube with through the tube antegrade cholangiogram demonstrating multiple retained gallstones and a filling defect at the distal common bile duct, potentially choledocholithiasis. This was nonobstructive. Signed, Dulcy Fanny. Earleen Newport, DO [facility] Radiology PLAN: Given the patient's history of refluxing bile through the skin during a capping trial, the multiple retained stones, and the filling defect in the common bile duct, the drain was placed to gravity and the capping trial terminated. Routine exchanges are recommended, if the patient is not a surgical candidate Electronically Signed   By: Corrie Mckusick D.O.   On: 01/23/2018 10:01    Assessment/Plan  : #1- history of cholecystitis status post percutaneous drain placement-she appears to tolerate this well  although she is somewhat irritated with it at times she is followed by interventional radiology and apparently her cystic duct is blocked and this will need to drain for prolonged period of time with periodic drain  replacement.  Her weight continues to be stable initially she had some weight loss after all this.  2.  History of mildly elevated TSH in the past this has normalized will monitor periodically.  3.  Dementia appears to be at baseline she is on Aricept she does well with supportive care again her weight is stable.  4.  History of cervical C2 fracture distant past-she no longer has a cervical collar neurologically appears to be stable is not really complaining of any issues in this regard  #5 history of B12 deficiency she continues on supplementation will recheck a vitamin D level in December it was 872.  6.  History of vitamin D deficiency she is on supplementation level was  34.1 back in December will recheck this as well.  7.  History of breast carcinoma she is status post modified mastectomy she has declined any further work-up.  8.  History of anemia this is been relatively stable last hemoglobin was 11.3 on the lower end of baseline will update this since we are obtaining B12 and vitamin D level as well.  She does not appear to be overtly symptomatic.  [id number]

## 2018-02-15 ENCOUNTER — Encounter (HOSPITAL_COMMUNITY)
Admission: RE | Admit: 2018-02-15 | Discharge: 2018-02-15 | Disposition: A | Discharge status: Home / Self Care | Payer: Medicare Other | Source: Skilled Nursing Facility | Attending: *Deleted | Admitting: *Deleted

## 2018-02-15 DIAGNOSIS — R6889 Other general symptoms and signs: Secondary | ICD-10-CM | POA: Diagnosis not present

## 2018-02-15 DIAGNOSIS — Z9181 History of falling: Secondary | ICD-10-CM | POA: Diagnosis not present

## 2018-02-15 DIAGNOSIS — Z978 Presence of other specified devices: Secondary | ICD-10-CM | POA: Diagnosis not present

## 2018-02-15 DIAGNOSIS — E039 Hypothyroidism, unspecified: Secondary | ICD-10-CM | POA: Diagnosis not present

## 2018-02-15 DIAGNOSIS — E559 Vitamin D deficiency, unspecified: Secondary | ICD-10-CM | POA: Diagnosis not present

## 2018-02-15 DIAGNOSIS — D519 Vitamin B12 deficiency anemia, unspecified: Secondary | ICD-10-CM | POA: Diagnosis not present

## 2018-02-15 LAB — CBC WITH DIFFERENTIAL/PLATELET
Basophils Absolute: 0 10*3/uL (ref 0.0–0.1)
Basophils Relative: 1 %
Eosinophils Absolute: 0.4 10*3/uL (ref 0.0–0.7)
Eosinophils Relative: 6 %
HCT: 43.2 % (ref 36.0–46.0)
Hemoglobin: 14.1 g/dL (ref 12.0–15.0)
Lymphocytes Relative: 30 %
Lymphs Abs: 1.8 10*3/uL (ref 0.7–4.0)
MCH: 30.9 pg (ref 26.0–34.0)
MCHC: 32.6 g/dL (ref 30.0–36.0)
MCV: 94.5 fL (ref 78.0–100.0)
Monocytes Absolute: 0.4 10*3/uL (ref 0.1–1.0)
Monocytes Relative: 7 %
Neutro Abs: 3.4 10*3/uL (ref 1.7–7.7)
Neutrophils Relative %: 56 %
Platelets: 245 10*3/uL (ref 150–400)
RBC: 4.57 MIL/uL (ref 3.87–5.11)
RDW: 14.7 % (ref 11.5–15.5)
WBC: 6.1 10*3/uL (ref 4.0–10.5)

## 2018-02-15 LAB — VITAMIN B12: Vitamin B-12: 741 pg/mL (ref 180–914)

## 2018-02-15 LAB — VITAMIN D 25 HYDROXY (VIT D DEFICIENCY, FRACTURES): Vit D, 25-Hydroxy: 35.9 ng/mL (ref 30.0–100.0)

## 2018-02-16 ENCOUNTER — Encounter: Payer: Self-pay | Admitting: Internal Medicine

## 2018-02-17 ENCOUNTER — Ambulatory Visit (HOSPITAL_COMMUNITY)
Admit: 2018-02-17 | Discharge: 2018-02-17 | Disposition: A | Discharge status: Home / Self Care | Payer: Medicare Other | Source: Ambulatory Visit | Attending: Interventional Radiology | Admitting: Interventional Radiology

## 2018-02-17 ENCOUNTER — Encounter (HOSPITAL_COMMUNITY): Payer: Self-pay | Admitting: Diagnostic Radiology

## 2018-02-17 DIAGNOSIS — Z4682 Encounter for fitting and adjustment of non-vascular catheter: Secondary | ICD-10-CM | POA: Diagnosis not present

## 2018-02-17 DIAGNOSIS — Z434 Encounter for attention to other artificial openings of digestive tract: Secondary | ICD-10-CM | POA: Diagnosis not present

## 2018-02-17 HISTORY — PX: IR EXCHANGE BILIARY DRAIN: IMG6046

## 2018-02-17 LAB — VITAMIN D 25 HYDROXY (VIT D DEFICIENCY, FRACTURES): Vit D, 25-Hydroxy: 41.6 ng/mL (ref 30.0–100.0)

## 2018-02-17 MED ORDER — IOPAMIDOL (ISOVUE-300) INJECTION 61%
INTRAVENOUS | Status: AC
  Administered 2018-02-17: 10 mL
  Filled 2018-02-17: qty 50

## 2018-02-17 MED ORDER — LIDOCAINE HCL 1 % IJ SOLN
INTRAMUSCULAR | Status: AC
  Filled 2018-02-17: qty 20

## 2018-02-17 MED ORDER — LIDOCAINE HCL (PF) 1 % IJ SOLN
INTRAMUSCULAR | Status: DC | PRN
  Administered 2018-02-17: 2 mL

## 2018-02-17 NOTE — Procedures (Signed)
Successful exchange of cholecystostomy tube.  No blood loss and no immediate complication.

## 2018-03-20 ENCOUNTER — Encounter (HOSPITAL_COMMUNITY): Payer: Self-pay | Admitting: Interventional Radiology

## 2018-03-20 ENCOUNTER — Ambulatory Visit (HOSPITAL_COMMUNITY)
Admit: 2018-03-20 | Discharge: 2018-03-20 | Disposition: A | Discharge status: Home / Self Care | Payer: Medicare Other | Source: Ambulatory Visit | Attending: Diagnostic Radiology | Admitting: Diagnostic Radiology

## 2018-03-20 DIAGNOSIS — K8010 Calculus of gallbladder with chronic cholecystitis without obstruction: Secondary | ICD-10-CM | POA: Diagnosis not present

## 2018-03-20 DIAGNOSIS — Z4682 Encounter for fitting and adjustment of non-vascular catheter: Secondary | ICD-10-CM | POA: Diagnosis not present

## 2018-03-20 DIAGNOSIS — T85520A Displacement of bile duct prosthesis, initial encounter: Secondary | ICD-10-CM | POA: Diagnosis not present

## 2018-03-20 HISTORY — PX: IR EXCHANGE BILIARY DRAIN: IMG6046

## 2018-03-20 MED ORDER — IOPAMIDOL (ISOVUE-300) INJECTION 61%
INTRAVENOUS | Status: AC
  Administered 2018-03-20: 10 mL
  Filled 2018-03-20: qty 50

## 2018-03-20 MED ORDER — LIDOCAINE HCL (PF) 1 % IJ SOLN
INTRAMUSCULAR | Status: DC | PRN
  Administered 2018-03-20: 5 mL

## 2018-03-20 MED ORDER — LIDOCAINE HCL 1 % IJ SOLN
INTRAMUSCULAR | Status: AC
  Filled 2018-03-20: qty 20

## 2018-03-24 ENCOUNTER — Non-Acute Institutional Stay (SKILLED_NURSING_FACILITY): Payer: Medicare Other | Admitting: Internal Medicine

## 2018-03-24 ENCOUNTER — Encounter: Payer: Self-pay | Admitting: Internal Medicine

## 2018-03-24 DIAGNOSIS — F0390 Unspecified dementia without behavioral disturbance: Secondary | ICD-10-CM

## 2018-03-24 DIAGNOSIS — Z434 Encounter for attention to other artificial openings of digestive tract: Secondary | ICD-10-CM

## 2018-03-24 NOTE — Progress Notes (Signed)
Location:   [facility] Room Number: 141/D Place of Service:  SNF [phone]) Provider:  Clydene Fake, MD  Patient Care Team: Virgie Dad, MD as PCP - General (Internal Medicine) Rolm Baptise as Physician Assistant (Internal Medicine)  Extended Emergency Contact Information Primary Emergency Contact: [name], [place] [address] [place] Phone: [phone] Relation: Friend Secondary Emergency Contact: Ton,[name]  [place] Phone: [phone] Relation: Daughter  Code Status:  Full Code Goals of care: Advanced Directive information Advanced Directives 03/24/2018  Does Patient Have a Medical Advance Directive? Yes  Type of Advance Directive (No Data)  Does patient want to make changes to medical advance directive? No - Patient declined  Copy of [place] in Chart? -  Would patient like information on creating a medical advance directive? No - Patient declined     Chief Complaint  Patient presents with  . Medical Management of Chronic Issues    Patient being seen for Routine Visit    HPI:  Pt is a 84 y.o. female seen today for medical management of chronic diseases.   Patient is long term Resident of facility. Patient has h/o Breast cancer s/p Modified Mastectomy, S/P right hemi colectomy and past history of C2 fracture.and Dementia Patientwas diagnosed with AcuteCholecystitisin 04/18. Since she was considered high risk for surgery she had percutaneous cholecystostomy drain placement 01/27/17 by IR Patient recently had the drain changed. She always c/o about the drain but otherwise she did not have any new Complains. Her weight is upto 151 lbs from 149. She walks around in the facility with Gilford Rile. No New Nursing issues   Past Medical History:  Diagnosis Date  . Breast cancer ([facility])   . Cognitive communication deficit   . Dementia   .  Difficulty in walking(719.7)   . Fall   . Muscle weakness (generalized)   . Urinary tract infection, site not specified   . Vitamin B deficiency    Past Surgical History:  Procedure Laterality Date  . COLON SURGERY    . IR CATHETER TUBE CHANGE  05/07/2017  . IR EXCHANGE BILIARY DRAIN  07/02/2017  . IR EXCHANGE BILIARY DRAIN  07/10/2017  . IR EXCHANGE BILIARY DRAIN  08/28/2017  . IR EXCHANGE BILIARY DRAIN  10/11/2017  . IR EXCHANGE BILIARY DRAIN  10/28/2017  . IR EXCHANGE BILIARY DRAIN  12/23/2017  . IR EXCHANGE BILIARY DRAIN  01/23/2018  . IR EXCHANGE BILIARY DRAIN  02/17/2018  . IR EXCHANGE BILIARY DRAIN  03/20/2018  . IR PERC CHOLECYSTOSTOMY  01/27/2017  . IR RADIOLOGIST EVAL & MGMT  03/05/2017  . MASTECTOMY, PARTIAL Right     No Known Allergies  Outpatient Encounter Medications as of 03/24/2018  Medication Sig  . acetaminophen (TYLENOL) 325 MG tablet Take 650 mg by mouth every 4 (four) hours as needed.  . calcium carbonate (TUMS) 500 MG chewable tablet Chew 1 tablet by mouth daily.  . Cholecalciferol 1000 units tablet Take 1,000 Units by mouth at bedtime.   . Cyanocobalamin (VITAMIN B-12) 1000 MCG SUBL Place 1 tablet under the tongue at bedtime.  . donepezil (ARICEPT) 10 MG tablet Take 1 tablet by mouth at bedtime.  . Sodium Chloride Flush (NORMAL SALINE FLUSH) 0.9 % SOLN Flush cholecystostomy tube daily with 5 cc  Once a day   No facility-administered encounter medications on file as of 03/24/2018.  Review of Systems  Review of Systems  Constitutional: Negative for activity change, appetite change, chills, diaphoresis, fatigue and fever.  HENT: Negative for mouth sores, postnasal drip, rhinorrhea, sinus pain and sore throat.   Respiratory: Negative for apnea, cough, chest tightness, shortness of breath and wheezing.   Cardiovascular: Negative for chest pain, palpitations and leg swelling.  Gastrointestinal: Negative for abdominal distention, abdominal pain, constipation, diarrhea,  nausea and vomiting.  Genitourinary: Negative for dysuria and frequency.  Musculoskeletal: Negative for arthralgias, joint swelling and myalgias.  Skin: Negative for rash.  Neurological: Negative for dizziness, syncope, weakness, light-headedness and numbness.  Psychiatric/Behavioral: Negative for behavioral problems, confusion and sleep disturbance.     Immunization History  Administered Date(s) Administered  . Influenza-Unspecified 07/28/2014, 07/20/2016, 07/26/2017  . Pneumococcal Conjugate-13 06/21/2017  . Pneumococcal-Unspecified 08/01/2016  . Tdap 07/11/2017   Pertinent  Health Maintenance Due  Topic Date Due  . INFLUENZA VACCINE  05/22/2018  . DEXA SCAN  Completed  . PNA vac Low Risk Adult  Completed   Fall Risk  07/11/2017  Falls in the past year? No   Functional Status Survey:    Vitals:   03/24/18 1215  BP: 117/72  Pulse: 72  Resp: 20  Temp: (!) 97 F (36.1 C)  TempSrc: Oral  SpO2: 96%  Weight: 151 lb 6.4 oz (68.7 kg)  Height: 5\' 3"  (1.6 m)   Body mass index is 26.82 kg/m. Physical Exam  Constitutional: She appears well-developed and well-nourished.  HENT:  Head: Normocephalic.  Mouth/Throat: Oropharynx is clear and moist.  Eyes: Pupils are equal, round, and reactive to light.  Neck: Neck supple.  Cardiovascular: Normal rate and regular rhythm.  Pulmonary/Chest: Effort normal and breath sounds normal.  Abdominal: Soft. Bowel sounds are normal. She exhibits no distension. There is no tenderness. There is no guarding.  Drain Placed in RUQ with no tenderness  Musculoskeletal: She exhibits no edema.  Neurological: She is alert.  Skin: Skin is warm and dry.    Labs reviewed: Recent Labs    10/09/17 0910 10/10/17 1240 01/02/18 0415  NA 139 139 143  K 5.4* 3.9 4.2  CL 106 104 108  CO2 22 25 26   GLUCOSE 93 100* 91  BUN 16 21* 17  CREATININE 0.89 1.07* 0.86  CALCIUM 9.1 9.0 8.9   Recent Labs    07/31/17 0700 10/05/17 1506 10/09/17 0910    AST 20 19 34  ALT 21 19 22   ALKPHOS 84 84 76  BILITOT 0.6 0.2* 0.8  PROT 7.1 7.3 7.5  ALBUMIN 3.4* 3.6 3.5   Recent Labs    01/02/18 0415 02/14/18 0732 02/15/18 0900  WBC 6.6 6.3 6.1  NEUTROABS 3.4 3.3 3.4  HGB 11.3* 11.5* 14.1  HCT 35.3* 36.2 43.2  MCV 95.9 94.5 94.5  PLT 228 230 245   Lab Results  Component Value Date   TSH 3.877 01/02/2018   No results found for: HGBA1C No results found for: CHOL, HDL, LDLCALC, LDLDIRECT, TRIG, CHOLHDL  Significant Diagnostic Results in last 30 days:  Ir Exchange Biliary Drain  Result Date: 03/20/2018 INDICATION: 84 year old female with a chronic indwelling percutaneous cholecystostomy tube for a history of cholelithiasis and prior calculus cholecystitis. She presents for routine exchange. EXAM: Cholangiogram through existing tube and cholecystostomy tube exchange MEDICATIONS: None required ANESTHESIA/SEDATION: None required. FLUOROSCOPY TIME:  Fluoroscopy Time: 0 minutes 30 seconds (3 mGy). COMPLICATIONS: None immediate. PROCEDURE: Informed written consent was obtained from the patient after a thorough discussion of the procedural  risks, benefits and alternatives. All questions were addressed. Maximal Sterile Barrier Technique was utilized including caps, mask, sterile gowns, sterile gloves, sterile drape, hand hygiene and skin antiseptic. A timeout was performed prior to the initiation of the procedure. A gentle hand injection of contrast material was performed. The tube has pulled back and is just within the gallbladder fundus. There are several filling defects within the gallbladder lumen consistent with cholelithiasis. The cystic duct does not fill initially. However, after a second contrast injection slightly pressurized in the gallbladder, the cystic duct and common bile duct fill. No evidence of choledocholithiasis. Contrast material passes into the small bowel. The existing tube was cut and removed over an Amplatz wire. A new Cook 10.2  [place] all-purpose drainage catheter was advanced over the wire and formed in the gallbladder lumen. An image was obtained and stored for the medical record. The tube was secured to the skin with an adhesive fixation device. Suture was deferred secondary to irritation at the tube entry site. IMPRESSION: 1. Persistent cholelithiasis.  No evidence of choledocholithiasis. 2. Routine exchange of chronic indwelling percutaneous cholecystostomy tube. PLAN: 1. Recommend referral to general surgery to assess for candidacy for elective cholecystectomy. 2. Return Interventional Radiology in 10 weeks for next tube evaluation and exchange. Signed, Criselda Peaches, MD [facility] Radiology Electronically Signed   By: Jacqulynn Cadet M.D.   On: 03/20/2018 11:48    Assessment/Plan   S/P percutaneous cholecystostomy drain placement Per radiology her cystic Duct is blocked and she will need Drain for long time aith exchange every 2 months. Patient had a repeat CAT scan 12/18 done in the emergency room and did not show any acute changes. Her liver enzymes have been normal. She has been gaining weight and has been largely asymptomatic. No Change. Will repeat Hepatic function Dementia She is on Aricept and supportive care   Family/ staff Communication:   Labs/tests ordered:    Total time spent in this patient care encounter was 25_ minutes; greater than 50% of the visit spent counseling patient, reviewing records , Labs and coordinating care for problems addressed at this encounter.

## 2018-03-31 ENCOUNTER — Other Ambulatory Visit (HOSPITAL_COMMUNITY): Payer: Medicare Other

## 2018-04-08 ENCOUNTER — Encounter (HOSPITAL_COMMUNITY)
Admission: RE | Admit: 2018-04-08 | Discharge: 2018-04-08 | Disposition: A | Discharge status: Home / Self Care | Payer: Medicare Other | Source: Skilled Nursing Facility | Attending: Internal Medicine | Admitting: Internal Medicine

## 2018-04-08 DIAGNOSIS — E538 Deficiency of other specified B group vitamins: Secondary | ICD-10-CM | POA: Diagnosis not present

## 2018-04-08 LAB — BASIC METABOLIC PANEL
Anion gap: 9 (ref 5–15)
BUN: 18 mg/dL (ref 6–20)
CO2: 26 mmol/L (ref 22–32)
Calcium: 9.1 mg/dL (ref 8.9–10.3)
Chloride: 104 mmol/L (ref 101–111)
Creatinine, Ser: 0.89 mg/dL (ref 0.44–1.00)
GFR calc Af Amer: >60 mL/min (ref >60)
GFR calc non Af Amer: 55 mL/min — ABNORMAL LOW (ref >60)
Glucose, Bld: 100 mg/dL — ABNORMAL HIGH (ref 65–99)
Potassium: 4.2 mmol/L (ref 3.5–5.1)
Sodium: 139 mmol/L (ref 135–145)

## 2018-04-08 LAB — CBC
HCT: 41.3 % (ref 36.0–46.0)
Hemoglobin: 13.4 g/dL (ref 12.0–15.0)
MCH: 30.9 pg (ref 26.0–34.0)
MCHC: 32.4 g/dL (ref 30.0–36.0)
MCV: 95.4 fL (ref 78.0–100.0)
Platelets: 256 10*3/uL (ref 150–400)
RBC: 4.33 MIL/uL (ref 3.87–5.11)
RDW: 15 % (ref 11.5–15.5)
WBC: 7.6 10*3/uL (ref 4.0–10.5)

## 2018-04-08 LAB — HEPATIC FUNCTION PANEL
ALT: 14 U/L (ref 14–54)
AST: 20 U/L (ref 15–41)
Albumin: 3.7 g/dL (ref 3.5–5.0)
Alkaline Phosphatase: 71 U/L (ref 38–126)
Bilirubin, Direct: <0.1 mg/dL — ABNORMAL LOW (ref 0.1–0.5)
Total Bilirubin: 0.4 mg/dL (ref 0.3–1.2)
Total Protein: 7.6 g/dL (ref 6.5–8.1)

## 2018-04-17 ENCOUNTER — Ambulatory Visit (HOSPITAL_COMMUNITY)
Admit: 2018-04-17 | Discharge: 2018-04-17 | Disposition: A | Discharge status: Home / Self Care | Payer: Medicare Other | Source: Ambulatory Visit | Attending: Diagnostic Radiology | Admitting: Diagnostic Radiology

## 2018-04-17 ENCOUNTER — Encounter (HOSPITAL_COMMUNITY): Payer: Self-pay | Admitting: Interventional Radiology

## 2018-04-17 DIAGNOSIS — K8020 Calculus of gallbladder without cholecystitis without obstruction: Secondary | ICD-10-CM | POA: Diagnosis not present

## 2018-04-17 DIAGNOSIS — Z4682 Encounter for fitting and adjustment of non-vascular catheter: Secondary | ICD-10-CM | POA: Insufficient documentation

## 2018-04-17 DIAGNOSIS — T85520A Displacement of bile duct prosthesis, initial encounter: Secondary | ICD-10-CM | POA: Diagnosis not present

## 2018-04-17 HISTORY — PX: IR EXCHANGE BILIARY DRAIN: IMG6046

## 2018-04-17 MED ORDER — IOPAMIDOL (ISOVUE-300) INJECTION 61%
INTRAVENOUS | Status: AC
  Administered 2018-04-17: 15 mL
  Filled 2018-04-17: qty 50

## 2018-04-17 MED ORDER — LIDOCAINE HCL (PF) 1 % IJ SOLN
INTRAMUSCULAR | Status: DC | PRN
  Administered 2018-04-17: 5 mL

## 2018-04-17 MED ORDER — LIDOCAINE HCL 1 % IJ SOLN
INTRAMUSCULAR | Status: AC
  Filled 2018-04-17: qty 20

## 2018-04-17 NOTE — Procedures (Signed)
Pre procedural Dx: Acute cholecysitis Post procedural Dx: Same  Successful fluoro guided chole tube exchange. Chole tube connected to gravity bag.  EBL: None  Complications: None immediate  Ronny Bacon, MD Pager #: [phone]

## 2018-05-05 DIAGNOSIS — B351 Tinea unguium: Secondary | ICD-10-CM | POA: Diagnosis not present

## 2018-05-05 DIAGNOSIS — I739 Peripheral vascular disease, unspecified: Secondary | ICD-10-CM | POA: Diagnosis not present

## 2018-05-05 DIAGNOSIS — R262 Difficulty in walking, not elsewhere classified: Secondary | ICD-10-CM | POA: Diagnosis not present

## 2018-05-05 DIAGNOSIS — L603 Nail dystrophy: Secondary | ICD-10-CM | POA: Diagnosis not present

## 2018-05-15 ENCOUNTER — Other Ambulatory Visit (HOSPITAL_COMMUNITY): Payer: Medicare Other

## 2018-05-21 ENCOUNTER — Encounter: Payer: Self-pay | Admitting: Internal Medicine

## 2018-05-21 ENCOUNTER — Non-Acute Institutional Stay (SKILLED_NURSING_FACILITY): Payer: Medicare Other | Admitting: Internal Medicine

## 2018-05-21 DIAGNOSIS — F0390 Unspecified dementia without behavioral disturbance: Secondary | ICD-10-CM

## 2018-05-21 DIAGNOSIS — E538 Deficiency of other specified B group vitamins: Secondary | ICD-10-CM

## 2018-05-21 DIAGNOSIS — K8001 Calculus of gallbladder with acute cholecystitis with obstruction: Secondary | ICD-10-CM | POA: Diagnosis not present

## 2018-05-21 DIAGNOSIS — E559 Vitamin D deficiency, unspecified: Secondary | ICD-10-CM

## 2018-05-21 DIAGNOSIS — S12191D Other nondisplaced fracture of second cervical vertebra, subsequent encounter for fracture with routine healing: Secondary | ICD-10-CM | POA: Diagnosis not present

## 2018-05-21 NOTE — Progress Notes (Signed)
Location:   [facility] Room Number: 141/D Place of Service:  SNF [id number]) Provider:  Freddi Starr, MD  Patient Care Team: Virgie Dad, MD as PCP - General (Internal Medicine) Rolm Baptise as Physician Assistant (Internal Medicine)  Extended Emergency Contact Information Primary Emergency Contact: [name], [place] [address] [place] Phone: [phone] Relation: Friend Secondary Emergency Contact: Erker,[name]  [place] Phone: [phone] Relation: Daughter  Code Status:  Full Code Goals of care: Advanced Directive information Advanced Directives 05/21/2018  Does Patient Have a Medical Advance Directive? Yes  Type of Advance Directive (No Data)  Does patient want to make changes to medical advance directive? No - Patient declined  Copy of [place] in Chart? -  Would patient like information on creating a medical advance directive? No - Patient declined     Chief Complaint  Patient presents with  . Medical Management of Chronic Issues    Patient is being seen for a routine visit of Medical Management   For medical management of chronic medical conditions including dementia- history of cholecystitis drain placement- history of cervical fracture- status post right hemicolectomy-and history of breast cancer  HPI:  Pt is a 84 y.o. female seen today for medical management of chronic diseases.  As noted above.  She continues to be stable and does well with supportive care.  She does have a history of acute cholecystitis back in April of last year and thought to be a poor surgical candidate.  She did have a drain placed secondary to a blocked cystic duct- to have this exchanged every 2 months and this is done by interventional radiology.  She appears to be tolerating this well although she does express occasional irritation with it  Her other  medical issues appear to be stable her weight appears to be relatively stable at 148.8 pounds.  She does have a history of a C2 fracture at one point had neck collar but this was discontinued a significant time ago and she appears to be doing well in this regards.  She also has a history of B12 and vitamin D deficiency recent labs showed both levels are within normal range she is on supplementation  She  has no complaints today and vital signs appear to be stable   Past Medical History:  Diagnosis Date  . Breast cancer ([place])   . Cognitive communication deficit   . Dementia   . Difficulty in walking(719.7)   . Fall   . Muscle weakness (generalized)   . Urinary tract infection, site not specified   . Vitamin B deficiency    Past Surgical History:  Procedure Laterality Date  . COLON SURGERY    . IR CATHETER TUBE CHANGE  05/07/2017  . IR EXCHANGE BILIARY DRAIN  07/02/2017  . IR EXCHANGE BILIARY DRAIN  07/10/2017  . IR EXCHANGE BILIARY DRAIN  08/28/2017  . IR EXCHANGE BILIARY DRAIN  10/11/2017  . IR EXCHANGE BILIARY DRAIN  10/28/2017  . IR EXCHANGE BILIARY DRAIN  12/23/2017  . IR EXCHANGE BILIARY DRAIN  01/23/2018  . IR EXCHANGE BILIARY DRAIN  02/17/2018  . IR EXCHANGE BILIARY DRAIN  03/20/2018  . IR EXCHANGE BILIARY DRAIN  04/17/2018  . IR PERC CHOLECYSTOSTOMY  01/27/2017  . IR RADIOLOGIST EVAL & MGMT  03/05/2017  . MASTECTOMY, PARTIAL Right     No Known  Allergies  Outpatient Encounter Medications as of 05/21/2018  Medication Sig  . acetaminophen (TYLENOL) 325 MG tablet Take 650 mg by mouth every 4 (four) hours as needed.  . calcium carbonate (TUMS) 500 MG chewable tablet Chew 1 tablet by mouth daily.  . Cholecalciferol 1000 units tablet Take 1,000 Units by mouth at bedtime.   . Cyanocobalamin (VITAMIN B-12) 1000 MCG SUBL Place 1 tablet under the tongue at bedtime.  . donepezil (ARICEPT) 10 MG tablet Take 1 tablet by mouth at bedtime.  . polyethylene glycol (MIRALAX / GLYCOLAX) packet Take  17 g by mouth daily.  . Sodium Chloride Flush (NORMAL SALINE FLUSH) 0.9 % SOLN Flush cholecystostomy tube daily with 5 cc  Once a day   No facility-administered encounter medications on file as of 05/21/2018.      Review of Systems   General she is not complaining of any fever chills her weight appears to be relatively stable.  Skin does not complain of rashes or itching does have a drain placed right lower abdomen which is draining dark-colored fluid.  Head ears eyes nose mouth and throat is not complaining of any sore throat neck pain or visual changes she has prescription lenses does not report any difficulty swallowing.  Respiratory is not complaining of shortness of breath or cough.  Cardiac does not complain of chest pain has quite minimal lower extremity edema.  GI does not complain of abdominal discomfort nausea vomiting diarrhea constipation.  GU is not complaining of any dysuria.  Muscular skeletal continues to ambulate in her walker does not complain of joint pain.  Neurologic does not complain of syncope dizziness headache or numbness.  Psych does not complain of any anxiety or depression-  Immunization History  Administered Date(s) Administered  . Influenza-Unspecified 07/28/2014, 07/20/2016, 07/26/2017  . Pneumococcal Conjugate-13 06/21/2017  . Pneumococcal-Unspecified 08/01/2016  . Tdap 07/11/2017   Pertinent  Health Maintenance Due  Topic Date Due  . INFLUENZA VACCINE  05/22/2018  . DEXA SCAN  Completed  . PNA vac Low Risk Adult  Completed   Fall Risk  07/11/2017  Falls in the past year? No   Functional Status Survey:    Vitals:   05/21/18 1122  BP: 105/66  Pulse: 80  Resp: 20  Temp: (!) 96.8 F (36 C)  TempSrc: Oral  SpO2: 96%  Weight: 148 lb 12.8 oz (67.5 kg)  Height: 5\' 3"  (1.6 m)   Body mass index is 26.36 kg/m. Physical Exam   \In general this is a pleasant elderly female no distress sitting as usual in her chair she appears  comfortable.  Her skin is warm and dry she does have the drain on the right side of her abdomen with dark tan-colored drainage is baseline  Eyes she has prescription lenses visual acuity appears grossly intact sclera and conjunctive are clear.  Oropharynx is clear mucous membranes moist.  Chest is clear to auscultation there is no labored breathing.  Heart is regular rate and rhythm without murmur gallop or rub continues with her baseline trace lower extremity edema.  Abdomen is soft nontender with positive bowel sounds she does have abdominal drain on the right as noted above.  Musculoskeletal is able to move all extremities x4 at baseline continues to ambulate with a walker appears to be intact all 4 extremities.  Neurologic is grossly intact her speech is clear no lateralizing findings.  Psych she is oriented to self is pleasant appropriate can carry on a short conversation is cooperative  with exam     Labs reviewed: Recent Labs    10/10/17 1240 01/02/18 0415 04/08/18 0719  NA 139 143 139  K 3.9 4.2 4.2  CL 104 108 104  CO2 25 26 26   GLUCOSE 100* 91 100*  BUN 21* 17 18  CREATININE 1.07* 0.86 0.89  CALCIUM 9.0 8.9 9.1   Recent Labs    10/05/17 1506 10/09/17 0910 04/08/18 0719  AST 19 34 20  ALT 19 22 14   ALKPHOS 84 76 71  BILITOT 0.2* 0.8 0.4  PROT 7.3 7.5 7.6  ALBUMIN 3.6 3.5 3.7   Recent Labs    01/02/18 0415 02/14/18 0732 02/15/18 0900 04/08/18 0719  WBC 6.6 6.3 6.1 7.6  NEUTROABS 3.4 3.3 3.4  --   HGB 11.3* 11.5* 14.1 13.4  HCT 35.3* 36.2 43.2 41.3  MCV 95.9 94.5 94.5 95.4  PLT 228 230 245 256   Lab Results  Component Value Date   TSH 3.877 01/02/2018   No results found for: HGBA1C No results found for: CHOL, HDL, LDLCALC, LDLDIRECT, TRIG, CHOLHDL  Significant Diagnostic Results in last 30 days:  No results found.  Assessment/Plan  1 history of acute cholecystitis-again she does have a drain placed which is followed by interventional  radiology- she appears to be tolerating this well --liver function tests appear to be within normal limits most recently done last month  She is asymptomatic.  2 history of dementia she continues to do well with supportive care her weight appears to be relatively stable nursing does not report any issues.  3.  History of cervical neck fracture again she is appears to have made nice recovery from this.  4- status post right hemi-colectomy--this has not been an issue.  5 history of B12 deficiency continues on supplementation level was normal at 741 back in April.  6.  History of vitamin D deficiency this also is normalized on supplementation last level was 41.6 in April   [id number]

## 2018-05-27 ENCOUNTER — Other Ambulatory Visit: Payer: Self-pay | Admitting: Radiology

## 2018-05-29 ENCOUNTER — Ambulatory Visit (HOSPITAL_COMMUNITY)
Admit: 2018-05-29 | Discharge: 2018-05-29 | DRG: 950 | Disposition: A | Discharge status: Skilled Nursing Facility | Payer: Medicare Other | Source: Skilled Nursing Facility | Attending: Interventional Radiology | Admitting: Interventional Radiology

## 2018-05-29 ENCOUNTER — Encounter (HOSPITAL_COMMUNITY): Payer: Self-pay | Admitting: Interventional Radiology

## 2018-05-29 DIAGNOSIS — Z4682 Encounter for fitting and adjustment of non-vascular catheter: Secondary | ICD-10-CM

## 2018-05-29 DIAGNOSIS — K8020 Calculus of gallbladder without cholecystitis without obstruction: Secondary | ICD-10-CM | POA: Diagnosis not present

## 2018-05-29 HISTORY — PX: IR EXCHANGE BILIARY DRAIN: IMG6046

## 2018-05-29 MED ORDER — IOPAMIDOL (ISOVUE-300) INJECTION 61%
INTRAVENOUS | Status: AC
  Administered 2018-05-29: 5 mL
  Filled 2018-05-29: qty 50

## 2018-05-29 MED ORDER — LIDOCAINE HCL 1 % IJ SOLN
INTRAMUSCULAR | Status: AC | PRN
  Administered 2018-05-29: 5 mL

## 2018-05-29 MED ORDER — LIDOCAINE HCL 1 % IJ SOLN
INTRAMUSCULAR | Status: AC
  Filled 2018-05-29: qty 20

## 2018-07-21 ENCOUNTER — Non-Acute Institutional Stay (SKILLED_NURSING_FACILITY): Payer: Medicare Other

## 2018-07-21 DIAGNOSIS — Z0000 Encounter for general adult medical examination without abnormal findings: Secondary | ICD-10-CM

## 2018-07-21 NOTE — Patient Instructions (Signed)
Cynthia [name] , Thank you for taking time to come for your Medicare Wellness Visit. I appreciate your ongoing commitment to your health goals. Please review the following plan we discussed and let me know if I can assist you in the future.   Screening recommendations/referrals: Colonoscopy excluded, over age 82 Mammogram excluded, over age 15 Bone Density up to date Recommended yearly ophthalmology/optometry visit for glaucoma screening and checkup Recommended yearly dental visit for hygiene and checkup  Vaccinations: Influenza vaccine due, will receive at [facility] Pneumococcal vaccine up to date, completed Tdap vaccine up to date, due 07/12/2027 Shingles vaccine not in past records    Advanced directives: in chart  Conditions/risks identified: none  Next appointment: Dr. Lyndel Safe makes rounds   Preventive Care 65 Years and Older, Female Preventive care refers to lifestyle choices and visits with your health care provider that can promote health and wellness. What does preventive care include?  A yearly physical exam. This is also called an annual well check.  Dental exams once or twice a year.  Routine eye exams. Ask your health care provider how often you should have your eyes checked.  Personal lifestyle choices, including:  Daily care of your teeth and gums.  Regular physical activity.  Eating a healthy diet.  Avoiding tobacco and drug use.  Limiting alcohol use.  Practicing safe sex.  Taking low-dose aspirin every day.  Taking vitamin and mineral supplements as recommended by your health care provider. What happens during an annual well check? The services and screenings done by your health care provider during your annual well check will depend on your age, overall health, lifestyle risk factors, and family history of disease. Counseling  Your health care provider may ask you questions about your:  Alcohol use.  Tobacco use.  Drug use.  Emotional  well-being.  Home and relationship well-being.  Sexual activity.  Eating habits.  History of falls.  Memory and ability to understand (cognition).  Work and work Statistician.  Reproductive health. Screening  You may have the following tests or measurements:  Height, weight, and BMI.  Blood pressure.  Lipid and cholesterol levels. These may be checked every 5 years, or more frequently if you are over 4 years old.  Skin check.  Lung cancer screening. You may have this screening every year starting at age 25 if you have a 30-pack-year history of smoking and currently smoke or have quit within the past 15 years.  Fecal occult blood test (FOBT) of the stool. You may have this test every year starting at age 51.  Flexible sigmoidoscopy or colonoscopy. You may have a sigmoidoscopy every 5 years or a colonoscopy every 10 years starting at age 46.  Hepatitis C blood test.  Hepatitis B blood test.  Sexually transmitted disease (STD) testing.  Diabetes screening. This is done by checking your blood sugar (glucose) after you have not eaten for a while (fasting). You may have this done every 1-3 years.  Bone density scan. This is done to screen for osteoporosis. You may have this done starting at age 72.  Mammogram. This may be done every 1-2 years. Talk to your health care provider about how often you should have regular mammograms. Talk with your health care provider about your test results, treatment options, and if necessary, the need for more tests. Vaccines  Your health care provider may recommend certain vaccines, such as:  Influenza vaccine. This is recommended every year.  Tetanus, diphtheria, and acellular pertussis (Tdap, Td) vaccine.  You may need a Td booster every 10 years.  Zoster vaccine. You may need this after age 22.  Pneumococcal 13-valent conjugate (PCV13) vaccine. One dose is recommended after age 76.  Pneumococcal polysaccharide (PPSV23) vaccine. One  dose is recommended after age 69. Talk to your health care provider about which screenings and vaccines you need and how often you need them. This information is not intended to replace advice given to you by your health care provider. Make sure you discuss any questions you have with your health care provider. Document Released: 11/04/2015 Document Revised: 06/27/2016 Document Reviewed: 08/09/2015 Elsevier Interactive Patient Education  2017 [place] Prevention in the Home Falls can cause injuries. They can happen to people of all ages. There are many things you can do to make your home safe and to help prevent falls. What can I do on the outside of my home?  Regularly fix the edges of walkways and driveways and fix any cracks.  Remove anything that might make you trip as you walk through a door, such as a raised step or threshold.  Trim any bushes or trees on the path to your home.  Use bright outdoor lighting.  Clear any walking paths of anything that might make someone trip, such as rocks or tools.  Regularly check to see if handrails are loose or broken. Make sure that both sides of any steps have handrails.  Any raised decks and porches should have guardrails on the edges.  Have any leaves, snow, or ice cleared regularly.  Use sand or salt on walking paths during winter.  Clean up any spills in your garage right away. This includes oil or grease spills. What can I do in the bathroom?  Use night lights.  Install grab bars by the toilet and in the tub and shower. Do not use towel bars as grab bars.  Use non-skid mats or decals in the tub or shower.  If you need to sit down in the shower, use a plastic, non-slip stool.  Keep the floor dry. Clean up any water that spills on the floor as soon as it happens.  Remove soap buildup in the tub or shower regularly.  Attach bath mats securely with double-sided non-slip rug tape.  Do not have throw rugs and other  things on the floor that can make you trip. What can I do in the bedroom?  Use night lights.  Make sure that you have a light by your bed that is easy to reach.  Do not use any sheets or blankets that are too big for your bed. They should not hang down onto the floor.  Have a firm chair that has side arms. You can use this for support while you get dressed.  Do not have throw rugs and other things on the floor that can make you trip. What can I do in the kitchen?  Clean up any spills right away.  Avoid walking on wet floors.  Keep items that you use a lot in easy-to-reach places.  If you need to reach something above you, use a strong step stool that has a grab bar.  Keep electrical cords out of the way.  Do not use floor polish or wax that makes floors slippery. If you must use wax, use non-skid floor wax.  Do not have throw rugs and other things on the floor that can make you trip. What can I do with my stairs?  Do not leave any  items on the stairs.  Make sure that there are handrails on both sides of the stairs and use them. Fix handrails that are broken or loose. Make sure that handrails are as long as the stairways.  Check any carpeting to make sure that it is firmly attached to the stairs. Fix any carpet that is loose or worn.  Avoid having throw rugs at the top or bottom of the stairs. If you do have throw rugs, attach them to the floor with carpet tape.  Make sure that you have a light switch at the top of the stairs and the bottom of the stairs. If you do not have them, ask someone to add them for you. What else can I do to help prevent falls?  Wear shoes that:  Do not have high heels.  Have rubber bottoms.  Are comfortable and fit you well.  Are closed at the toe. Do not wear sandals.  If you use a stepladder:  Make sure that it is fully opened. Do not climb a closed stepladder.  Make sure that both sides of the stepladder are locked into place.  Ask  someone to hold it for you, if possible.  Clearly mark and make sure that you can see:  Any grab bars or handrails.  First and last steps.  Where the edge of each step is.  Use tools that help you move around (mobility aids) if they are needed. These include:  Canes.  Walkers.  Scooters.  Crutches.  Turn on the lights when you go into a dark area. Replace any light bulbs as soon as they burn out.  Set up your furniture so you have a clear path. Avoid moving your furniture around.  If any of your floors are uneven, fix them.  If there are any pets around you, be aware of where they are.  Review your medicines with your doctor. Some medicines can make you feel dizzy. This can increase your chance of falling. Ask your doctor what other things that you can do to help prevent falls. This information is not intended to replace advice given to you by your health care provider. Make sure you discuss any questions you have with your health care provider. Document Released: 08/04/2009 Document Revised: 03/15/2016 Document Reviewed: 11/12/2014 Elsevier Interactive Patient Education  2017 [employer].

## 2018-07-21 NOTE — Progress Notes (Signed)
Subjective:   Cynthia [name] is a 84 y.o. female who presents for Medicare Annual (Subsequent) preventive examination at [facility]    Objective:     Vitals: BP 120/71 (BP Location: Left Arm, Patient Position: Supine)   Pulse 75   Temp 97.8 F (36.6 C) (Oral)   Ht 5\' 3"  (1.6 m)   Wt 148 lb (67.1 kg)   BMI 26.22 kg/m   Body mass index is 26.22 kg/m.  Advanced Directives 07/21/2018 05/21/2018 03/24/2018 01/09/2018 12/12/2017 10/30/2017 10/10/2017  Does Patient Have a Medical Advance Directive? Yes Yes Yes Yes Yes Yes Yes  Type of Advance Directive (No Data) (No Data) (No Data) (No Data) (No Data) (No Data) (No Data)  Does patient want to make changes to medical advance directive? No - Patient declined No - Patient declined No - Patient declined No - Patient declined No - Patient declined No - Patient declined No - Patient declined  Copy of [place] in [place]  Would patient like information on creating a medical advance directive? No - Patient declined No - Patient declined No - Patient declined No - Patient declined No - Patient declined No - Patient declined No - Patient declined    Tobacco Social History   Tobacco Use  Smoking Status Never Smoker  Smokeless Tobacco Never Used     Counseling given: Not Answered   Clinical Intake:  Pre-visit preparation completed: No  Pain : No/denies pain     Diabetes: No  How often do you need to have someone help you when you read instructions, pamphlets, or other written materials from your doctor or pharmacy?: 3 - Sometimes  Interpreter Needed?: No  Information entered by :: Tyson Dense, RN  Past Medical History:  Diagnosis Date  . Breast cancer ([place])   . Cognitive communication deficit   . Dementia   . Difficulty in walking(719.7)   . Fall   . Muscle weakness (generalized)   . Urinary tract infection, site not specified   . Vitamin B deficiency    Past Surgical  History:  Procedure Laterality Date  . COLON SURGERY    . IR CATHETER TUBE CHANGE  05/07/2017  . IR EXCHANGE BILIARY DRAIN  07/02/2017  . IR EXCHANGE BILIARY DRAIN  07/10/2017  . IR EXCHANGE BILIARY DRAIN  08/28/2017  . IR EXCHANGE BILIARY DRAIN  10/11/2017  . IR EXCHANGE BILIARY DRAIN  10/28/2017  . IR EXCHANGE BILIARY DRAIN  12/23/2017  . IR EXCHANGE BILIARY DRAIN  01/23/2018  . IR EXCHANGE BILIARY DRAIN  02/17/2018  . IR EXCHANGE BILIARY DRAIN  03/20/2018  . IR EXCHANGE BILIARY DRAIN  04/17/2018  . IR EXCHANGE BILIARY DRAIN  05/29/2018  . IR PERC CHOLECYSTOSTOMY  01/27/2017  . IR RADIOLOGIST EVAL & MGMT  03/05/2017  . MASTECTOMY, PARTIAL Right    Family History  Problem Relation Age of Onset  . Cancer Father        ? primary  . Diabetes Neg Hx   . Heart disease Neg Hx   . Stroke Neg Hx    Social History   Socioeconomic History  . Marital status: Widowed    Spouse name: Not on file  . Number of children: Not on file  . Years of education: Not on file  . Highest education level: Not on file  Occupational History  . Not on file  Social Needs  . Emergency planning/management officer  strain: Not hard at all  . Food insecurity:    Worry: Never true    Inability: Never true  . Transportation needs:    Medical: No    Non-medical: No  Tobacco Use  . Smoking status: Never Smoker  . Smokeless tobacco: Never Used  Substance and Sexual Activity  . Alcohol use: No  . Drug use: No  . Sexual activity: Not on file  Lifestyle  . Physical activity:    Days per week: 0 days    Minutes per session: 0 min  . Stress: Not at all  Relationships  . Social connections:    Talks on phone: Never    Gets together: Never    Attends religious service: Never    Active member of club or organization: No    Attends meetings of clubs or organizations: Never    Relationship status: Widowed  Other Topics Concern  . Not on file  Social History Narrative  . Not on file    Outpatient Encounter Medications as of  07/21/2018  Medication Sig  . acetaminophen (TYLENOL) 325 MG tablet Take 650 mg by mouth every 4 (four) hours as needed.  . calcium carbonate (TUMS) 500 MG chewable tablet Chew 1 tablet by mouth daily.  . Cholecalciferol 1000 units tablet Take 1,000 Units by mouth at bedtime.   . Cyanocobalamin (VITAMIN B-12) 1000 MCG SUBL Place 1 tablet under the tongue at bedtime.  . donepezil (ARICEPT) 10 MG tablet Take 1 tablet by mouth at bedtime.  . polyethylene glycol (MIRALAX / GLYCOLAX) packet Take 17 g by mouth daily.  . Sodium Chloride Flush (NORMAL SALINE FLUSH) 0.9 % SOLN Flush cholecystostomy tube daily with 5 cc  Once a day   No facility-administered encounter medications on file as of 07/21/2018.     Activities of Daily Living In your present state of health, do you have any difficulty performing the following activities: 07/21/2018  Hearing? N  Vision? N  Difficulty concentrating or making decisions? Y  Walking or climbing stairs? Y  Dressing or bathing? Y  Doing errands, shopping? Y  Preparing Food and eating ? Y  Using the Toilet? N  In the past six months, have you accidently leaked urine? N  Do you have problems with loss of bowel control? N  Managing your Medications? Y  Managing your Finances? Y  Housekeeping or managing your Housekeeping? Y  Some recent data might be hidden    Patient Care Team: Virgie Dad, MD as PCP - General (Internal Medicine) Rolm Baptise as Physician Assistant (Internal Medicine)    Assessment:   This is a routine wellness examination for Cynthia [name].  Exercise Activities and Dietary recommendations Current Exercise Habits: The patient does not participate in regular exercise at present, Exercise limited by: orthopedic condition(s)  Goals   None     Fall Risk Fall Risk  07/21/2018 07/11/2017  Falls in the past year? No No   Is the patient's home free of loose throw rugs in walkways, pet beds, electrical cords, etc?   yes      Grab  bars in the bathroom? yes      Handrails on the stairs?   yes      Adequate lighting?   yes  Depression Screen PHQ 2/9 Scores 07/21/2018 07/11/2017  PHQ - 2 Score 0 2  PHQ- 9 Score - 3     Cognitive Function     6CIT Screen 07/21/2018 07/11/2017  What Year? 4  points 4 points  What month? 0 points 3 points  What time? 0 points 0 points  Count back from 20 0 points 0 points  Months in reverse 4 points 0 points  Repeat phrase 6 points 10 points  Total Score 14 17    Immunization History  Administered Date(s) Administered  . Influenza-Unspecified 07/28/2014, 07/20/2016, 07/26/2017  . Pneumococcal Conjugate-13 06/21/2017  . Pneumococcal-Unspecified 08/01/2016  . Tdap 07/11/2017    Qualifies for Shingles Vaccine? Not in past records  Screening Tests Health Maintenance  Topic Date Due  . INFLUENZA VACCINE  05/22/2018  . TETANUS/TDAP  07/12/2027  . DEXA SCAN  Completed  . PNA vac Low Risk Adult  Completed    Cancer Screenings: Lung: Low Dose CT Chest recommended if Age 38-80 years, 30 pack-year currently smoking OR have quit w/in 15years. Patient does not qualify. Breast:  Up to date on Mammogram? Yes   Up to date of Bone Density/Dexa? Yes Colorectal: up to date  Additional Screenings:  Hepatitis C Screening: declined Flu vaccine due: will receive at [facility]:    I have personally reviewed and addressed the Medicare Annual Wellness questionnaire and have noted the following in the patient's chart:  A. Medical and social history B. Use of alcohol, tobacco or illicit drugs  C. Current medications and supplements D. Functional ability and status E.  Nutritional status F.  Physical activity G. Advance directives H. List of other physicians I.  Hospitalizations, surgeries, and ER visits in previous 12 months J.  [place] to include hearing, vision, cognitive, depression L. Referrals and appointments - none  In addition, I have reviewed and discussed  with patient certain preventive protocols, quality metrics, and best practice recommendations. A written personalized care plan for preventive services as well as general preventive health recommendations were provided to patient.  See attached scanned questionnaire for additional information.   Signed,   Tyson Dense, RN Nurse Health Advisor  Patient Concerns : None

## 2018-07-24 ENCOUNTER — Encounter (HOSPITAL_COMMUNITY): Payer: Self-pay | Admitting: Interventional Radiology

## 2018-07-24 ENCOUNTER — Inpatient Hospital Stay (HOSPITAL_COMMUNITY)
Admit: 2018-07-24 | Discharge: 2018-07-24 | DRG: 950 | Disposition: A | Discharge status: Home / Self Care | Payer: Medicare Other | Source: Ambulatory Visit | Attending: Interventional Radiology | Admitting: Interventional Radiology

## 2018-07-24 DIAGNOSIS — Z434 Encounter for attention to other artificial openings of digestive tract: Secondary | ICD-10-CM | POA: Diagnosis not present

## 2018-07-24 DIAGNOSIS — Z4682 Encounter for fitting and adjustment of non-vascular catheter: Secondary | ICD-10-CM

## 2018-07-24 HISTORY — PX: IR EXCHANGE BILIARY DRAIN: IMG6046

## 2018-07-24 MED ORDER — LIDOCAINE HCL 1 % IJ SOLN
INTRAMUSCULAR | Status: AC
  Filled 2018-07-24: qty 20

## 2018-07-24 MED ORDER — LIDOCAINE HCL 1 % IJ SOLN
INTRAMUSCULAR | Status: AC | PRN
  Administered 2018-07-24: 5 mL

## 2018-07-24 MED ORDER — IOPAMIDOL (ISOVUE-300) INJECTION 61%
INTRAVENOUS | Status: AC
  Administered 2018-07-24: 10 mL
  Filled 2018-07-24: qty 50

## 2018-08-06 DIAGNOSIS — L603 Nail dystrophy: Secondary | ICD-10-CM | POA: Diagnosis not present

## 2018-08-06 DIAGNOSIS — I739 Peripheral vascular disease, unspecified: Secondary | ICD-10-CM | POA: Diagnosis not present

## 2018-08-06 DIAGNOSIS — B351 Tinea unguium: Secondary | ICD-10-CM | POA: Diagnosis not present

## 2018-08-13 ENCOUNTER — Encounter: Payer: Self-pay | Admitting: Internal Medicine

## 2018-08-13 ENCOUNTER — Non-Acute Institutional Stay (SKILLED_NURSING_FACILITY): Payer: Medicare Other | Admitting: Internal Medicine

## 2018-08-13 DIAGNOSIS — E538 Deficiency of other specified B group vitamins: Secondary | ICD-10-CM

## 2018-08-13 DIAGNOSIS — F0390 Unspecified dementia without behavioral disturbance: Secondary | ICD-10-CM | POA: Diagnosis not present

## 2018-08-13 DIAGNOSIS — K8001 Calculus of gallbladder with acute cholecystitis with obstruction: Secondary | ICD-10-CM

## 2018-08-13 DIAGNOSIS — S12191D Other nondisplaced fracture of second cervical vertebra, subsequent encounter for fracture with routine healing: Secondary | ICD-10-CM | POA: Diagnosis not present

## 2018-08-13 NOTE — Progress Notes (Signed)
Location:    [facility] Room Number: 141/D Place of Service:  SNF [phone]) Provider: Granville Lewis PA-C  Virgie Dad, MD  Patient Care Team: Virgie Dad, MD as PCP - General (Internal Medicine) Rolm Baptise as Physician Assistant (Internal Medicine)  Extended Emergency Contact Information Primary Emergency Contact: [name], [place] [address] [place] Phone: [phone] Relation: Friend Secondary Emergency Contact: Marczak,[name]  [place] Phone: [phone] Relation: Daughter  Code Status:  Full Code Goals of care: Advanced Directive information Advanced Directives 08/13/2018  Does Patient Have a Medical Advance Directive? Yes  Type of Advance Directive (No Data)  Does patient want to make changes to medical advance directive? No - Patient declined  Copy of [place] in Chart? -  Would patient like information on creating a medical advance directive? No - Patient declined     Chief Complaint  Patient presents with  . Medical Management of Chronic Issues    Routine visit of medical management  Including medical management of dementia as well as history of cholecystitis with drain placement history of cervical fracture status post right hemo-colectomy as well as history of breast cancer.    HPI:  Pt is a 84 y.o. female seen today for medical management of chronic diseases.  As noted above.  She continues to be quite stable nursing does not report any recent issues.  She does have a history of acute cholecystitis this was in April 2018 she was a poor surgical candidate but she did have a drain placed secondary to having a blocked cystic duct- this is been exchanged about every 2 months by interventional radiology- has not really been an issue although at times she will express some irritation with  It  Her weight continues to be stable at around 151 pounds  this is a gain of about 2 pounds since this summer.  She also has a history of a cervical spine C2 fracture she did have a neck collar at one point that is been discontinued she is doing well in this regards.  She also has a history of B12 and vitamin D deficiency per labs earlier this year showed both levels are within normal limits.  Currently as usual she has no complaints--vital signs appear to have been stable   Past Medical History:  Diagnosis Date  . Breast cancer ([place])   . Cognitive communication deficit   . Dementia ([place])   . Difficulty in walking(719.7)   . Fall   . Muscle weakness (generalized)   . Urinary tract infection, site not specified   . Vitamin B deficiency    Past Surgical History:  Procedure Laterality Date  . COLON SURGERY    . IR CATHETER TUBE CHANGE  05/07/2017  . IR EXCHANGE BILIARY DRAIN  07/02/2017  . IR EXCHANGE BILIARY DRAIN  07/10/2017  . IR EXCHANGE BILIARY DRAIN  08/28/2017  . IR EXCHANGE BILIARY DRAIN  10/11/2017  . IR EXCHANGE BILIARY DRAIN  10/28/2017  . IR EXCHANGE BILIARY DRAIN  12/23/2017  . IR EXCHANGE BILIARY DRAIN  01/23/2018  . IR EXCHANGE BILIARY DRAIN  02/17/2018  . IR EXCHANGE BILIARY DRAIN  03/20/2018  . IR EXCHANGE BILIARY DRAIN  04/17/2018  . IR EXCHANGE BILIARY DRAIN  05/29/2018  . IR EXCHANGE BILIARY DRAIN  07/24/2018  . IR PERC CHOLECYSTOSTOMY  01/27/2017  . IR RADIOLOGIST EVAL &  MGMT  03/05/2017  . MASTECTOMY, PARTIAL Right     No Known Allergies  Outpatient Encounter Medications as of 08/13/2018  Medication Sig  . acetaminophen (TYLENOL) 325 MG tablet Take 650 mg by mouth every 4 (four) hours as needed.  . calcium carbonate (TUMS) 500 MG chewable tablet Chew 1 tablet by mouth daily.  . Cholecalciferol 1000 units tablet Take 1,000 Units by mouth at bedtime.   . Cyanocobalamin (VITAMIN B-12) 1000 MCG SUBL Place 1 tablet under the tongue at bedtime.  . donepezil (ARICEPT) 10 MG tablet Take 1 tablet by mouth at bedtime.  . polyethylene  glycol (MIRALAX / GLYCOLAX) packet Take 17 g by mouth daily.  . Sodium Chloride Flush (NORMAL SALINE FLUSH) 0.9 % SOLN Flush cholecystostomy tube daily with 5 cc  Once a day   No facility-administered encounter medications on file as of 08/13/2018.      Review of Systems  In general she is not complaining of any fever or chills.  Skin does not complain of rashes or itching she does have drain placed right side of her abdomen.  Head ears eyes nose mouth and throat has prescription lenses does not complain of visual changes or sore throat.  Respiratory is not complaining of any shortness of breath or cough.  Cardiac denies chest pain has some mild lower extremity edema.  GI is not complaining of any abdominal pain nausea vomiting diarrhea constipation appears to have a pretty good appetite.--Says at times she does get irritated with her drain but does not complain of any discomfort with this  GU denies dysuria.  Muscular skeletal denies joint pain continues to ambulate about facility and her walker.  Neurologic is not complain of dizziness headache numbness or syncope.  And psych does not complain of being overtly depressed or anxious she does have I would say moderate dementia  Immunization History  Administered Date(s) Administered  . Influenza-Unspecified 07/28/2014, 07/20/2016, 07/26/2017  . Pneumococcal Conjugate-13 06/21/2017  . Pneumococcal-Unspecified 08/01/2016  . Tdap 07/11/2017   Pertinent  Health Maintenance Due  Topic Date Due  . INFLUENZA VACCINE  Completed  . DEXA SCAN  Completed  . PNA vac Low Risk Adult  Completed   Fall Risk  07/21/2018 07/11/2017  Falls in the past year? No No   Functional Status Survey:    Vitals:   08/13/18 1112  BP: 133/67  Pulse: 73  Resp: 18  Temp: (!) 97.3 F (36.3 C)  TempSrc: Oral  SpO2: 96%  Weight: 151 lb 3.2 oz (68.6 kg)  Height: 5\' 3"  (1.6 m)  Manual blood pressure today was 138/68 Body mass index is 26.78  kg/m. Physical Exam   In general this is a pleasant elderly female in no distress sitting comfortably in her chair.  Her skin is warm and dry.  Eyes she has prescription lenses sclera and conjunctive are clear visual acuity appears to be intact.  Oropharynx is clear mucous membranes moist.  Chest is clear to auscultation there is no labored breathing.  Heart is regular rate and rhythm without murmur gallop or rub she has quite mild lower extremity edema bilaterally.  Abdomen is soft nontender with positive bowel sounds it is somewhat obese she does have a drain placed right side of abdomen draining a small amount of brownish drainage  Musculoskeletal is able to move all extremities x4 with baseline strength is able to stand without assistance and use her walker and does fairly well with this.  Neurologic is grossly intact  no lateralizing findings her speech is clear.  Psych she is oriented to self is pleasant appropriate carries on a short conversation but does have some cognitive deficits-  Labs reviewed: Recent Labs    10/10/17 1240 01/02/18 0415 04/08/18 0719  NA 139 143 139  K 3.9 4.2 4.2  CL 104 108 104  CO2 25 26 26   GLUCOSE 100* 91 100*  BUN 21* 17 18  CREATININE 1.07* 0.86 0.89  CALCIUM 9.0 8.9 9.1   Recent Labs    10/05/17 1506 10/09/17 0910 04/08/18 0719  AST 19 34 20  ALT 19 22 14   ALKPHOS 84 76 71  BILITOT 0.2* 0.8 0.4  PROT 7.3 7.5 7.6  ALBUMIN 3.6 3.5 3.7   Recent Labs    01/02/18 0415 02/14/18 0732 02/15/18 0900 04/08/18 0719  WBC 6.6 6.3 6.1 7.6  NEUTROABS 3.4 3.3 3.4  --   HGB 11.3* 11.5* 14.1 13.4  HCT 35.3* 36.2 43.2 41.3  MCV 95.9 94.5 94.5 95.4  PLT 228 230 245 256   Lab Results  Component Value Date   TSH 3.877 01/02/2018   No results found for: HGBA1C No results found for: CHOL, HDL, LDLCALC, LDLDIRECT, TRIG, CHOLHDL  Significant Diagnostic Results in last 30 days:  Ir Exchange Biliary Drain  Result Date:  07/24/2018 INDICATION: Routine cholecystostomy tube exchange EXAM: CHOLECYSTOSTOMY TUBE EXCHANGE MEDICATIONS: None ANESTHESIA/SEDATION: None FLUOROSCOPY TIME:  Fluoroscopy Time:  minutes 24 seconds (1 mGy). COMPLICATIONS: None immediate. PROCEDURE: Informed written consent was obtained from the patient after a thorough discussion of the procedural risks, benefits and alternatives. All questions were addressed. Maximal Sterile Barrier Technique was utilized including caps, mask, sterile gowns, sterile gloves, sterile drape, hand hygiene and skin antiseptic. A timeout was performed prior to the initiation of the procedure. The existing 10 French cholecystostomy tube was cut and exchanged over a Bentson wire for a new 10 [place] catheter. It was looped and string fixed in the lumen of the gallbladder. It was sewn to the skin with 0 Prolene. Contrast was injected. FINDINGS: The new 4 French drain is coiled in the lumen of the gallbladder. Filling defects in the gallbladder are compatible with gallstones. The cystic duct is patent. IMPRESSION: Successful cholecystostomy tube exchange. Electronically Signed   By: Marybelle Killings M.D.   On: 07/24/2018 13:27    Assessment/Plan  #1 history of acute cholecystitis again she is status post drain placement followed by interventional radiology she has tolerated this well-liver function tests have been within normal limits but will update this- she continues to be asymptomatic.  2.  History of dementia she is doing well with supportive care weight is stable actually is gained a couple pounds since the summer appears to have a pretty decent appetite per chart review.--Continues on Aricept  3.  History of cervical neck fracture is asymptomatic at one point had a neck collar this has long since been discontinued   #4-history of status post right hemo-colectomy- this was in fairly distant past and has not been an issue during her stay here.  5.  History of B12 deficiency  she is on supplementation B12 level was 741 on lab done in April.  6.-Vitamin D deficiency continues on vitamin D and level was 41.6 on lab done in April.   Will update lab values including CBC and CMP for updated values.  Clinically appears to be at baseline.  [id number]

## 2018-08-15 ENCOUNTER — Encounter (HOSPITAL_COMMUNITY)
Admission: RE | Admit: 2018-08-15 | Discharge: 2018-08-15 | Disposition: A | Discharge status: Home / Self Care | Payer: Medicare Other | Source: Skilled Nursing Facility | Attending: Internal Medicine | Admitting: Internal Medicine

## 2018-08-15 DIAGNOSIS — E039 Hypothyroidism, unspecified: Secondary | ICD-10-CM | POA: Insufficient documentation

## 2018-08-15 DIAGNOSIS — E538 Deficiency of other specified B group vitamins: Secondary | ICD-10-CM | POA: Insufficient documentation

## 2018-08-15 DIAGNOSIS — Z9181 History of falling: Secondary | ICD-10-CM | POA: Insufficient documentation

## 2018-08-15 DIAGNOSIS — F0390 Unspecified dementia without behavioral disturbance: Secondary | ICD-10-CM | POA: Diagnosis present

## 2018-08-15 DIAGNOSIS — Z978 Presence of other specified devices: Secondary | ICD-10-CM | POA: Insufficient documentation

## 2018-08-15 LAB — CBC WITH DIFFERENTIAL/PLATELET
Abs Immature Granulocytes: 0.03 10*3/uL (ref 0.00–0.07)
Basophils Absolute: 0 10*3/uL (ref 0.0–0.1)
Basophils Relative: 1 %
Eosinophils Absolute: 0.4 10*3/uL (ref 0.0–0.5)
Eosinophils Relative: 6 %
HCT: 41.9 % (ref 36.0–46.0)
Hemoglobin: 12.7 g/dL (ref 12.0–15.0)
Immature Granulocytes: 1 %
Lymphocytes Relative: 31 %
Lymphs Abs: 2 10*3/uL (ref 0.7–4.0)
MCH: 29.5 pg (ref 26.0–34.0)
MCHC: 30.3 g/dL (ref 30.0–36.0)
MCV: 97.2 fL (ref 80.0–100.0)
Monocytes Absolute: 0.6 10*3/uL (ref 0.1–1.0)
Monocytes Relative: 9 %
Neutro Abs: 3.3 10*3/uL (ref 1.7–7.7)
Neutrophils Relative %: 52 %
Platelets: 260 10*3/uL (ref 150–400)
RBC: 4.31 MIL/uL (ref 3.87–5.11)
RDW: 15.2 % (ref 11.5–15.5)
WBC: 6.3 10*3/uL (ref 4.0–10.5)
nRBC: 0 % (ref 0.0–0.2)

## 2018-08-15 LAB — COMPREHENSIVE METABOLIC PANEL
ALT: 15 U/L (ref 0–44)
AST: 20 U/L (ref 15–41)
Albumin: 3.7 g/dL (ref 3.5–5.0)
Alkaline Phosphatase: 73 U/L (ref 38–126)
Anion gap: 8 (ref 5–15)
BUN: 16 mg/dL (ref 8–23)
CO2: 27 mmol/L (ref 22–32)
Calcium: 8.9 mg/dL (ref 8.9–10.3)
Chloride: 104 mmol/L (ref 98–111)
Creatinine, Ser: 0.89 mg/dL (ref 0.44–1.00)
GFR calc Af Amer: >60 mL/min (ref >60)
GFR calc non Af Amer: 55 mL/min — ABNORMAL LOW (ref >60)
Glucose, Bld: 107 mg/dL — ABNORMAL HIGH (ref 70–99)
Potassium: 4 mmol/L (ref 3.5–5.1)
Sodium: 139 mmol/L (ref 135–145)
Total Bilirubin: 0.5 mg/dL (ref 0.3–1.2)
Total Protein: 7.3 g/dL (ref 6.5–8.1)

## 2018-09-11 ENCOUNTER — Encounter: Payer: Self-pay | Admitting: Internal Medicine

## 2018-09-11 ENCOUNTER — Non-Acute Institutional Stay (SKILLED_NURSING_FACILITY): Payer: Medicare Other | Admitting: Internal Medicine

## 2018-09-11 DIAGNOSIS — Z434 Encounter for attention to other artificial openings of digestive tract: Secondary | ICD-10-CM

## 2018-09-11 DIAGNOSIS — F0390 Unspecified dementia without behavioral disturbance: Secondary | ICD-10-CM | POA: Diagnosis not present

## 2018-09-11 NOTE — Progress Notes (Signed)
Location:    [facility] Room Number: 141/D Place of Service:  SNF [phone]) Provider: Veleta Miners MD  Virgie Dad, MD  Patient Care Team: Virgie Dad, MD as PCP - General (Internal Medicine) Rolm Baptise as Physician Assistant (Internal Medicine)  Extended Emergency Contact Information Primary Emergency Contact: [name], [place] [address] [place] of [place] Phone: [phone] Relation: Friend Secondary Emergency Contact: Ayo,[name]  [place] Phone: [phone] Relation: Daughter  Code Status:  Full Code Goals of care: Advanced Directive information Advanced Directives 09/11/2018  Does Patient Have a Medical Advance Directive? Yes  Type of Advance Directive (No Data)  Does patient want to make changes to medical advance directive? No - Patient declined  Copy of [place] in Chart? -  Would patient like information on creating a medical advance directive? No - Patient declined     Chief Complaint  Patient presents with  . Medical Management of Chronic Issues    Routine Visit of Medical Management     HPI:  Pt is a 84 y.o. female seen today for medical management of chronic diseases.   Patient is long term Resident of facility. Patient has h/o Breast cancer s/p Modified Mastectomy, S/P right hemi colectomy and past history of C2 fracture.and Dementia Patientwas diagnosed with AcuteCholecystitisin 04/18. Since she was considered high risk for surgery she had percutaneous cholecystostomy drain placement 01/27/17 by IR Patient gets her Drain changed by IR as needed. SH eis now use to it and doesn't complain. Her weight is slightly up to 154 lbs. She walks around in facility with her walker. No New Nursing issues. Past Medical History:  Diagnosis Date  . Breast cancer ([place])   . Cognitive communication deficit   . Dementia ([place])   . Difficulty in  walking(719.7)   . Fall   . Muscle weakness (generalized)   . Urinary tract infection, site not specified   . Vitamin B deficiency    Past Surgical History:  Procedure Laterality Date  . COLON SURGERY    . IR CATHETER TUBE CHANGE  05/07/2017  . IR EXCHANGE BILIARY DRAIN  07/02/2017  . IR EXCHANGE BILIARY DRAIN  07/10/2017  . IR EXCHANGE BILIARY DRAIN  08/28/2017  . IR EXCHANGE BILIARY DRAIN  10/11/2017  . IR EXCHANGE BILIARY DRAIN  10/28/2017  . IR EXCHANGE BILIARY DRAIN  12/23/2017  . IR EXCHANGE BILIARY DRAIN  01/23/2018  . IR EXCHANGE BILIARY DRAIN  02/17/2018  . IR EXCHANGE BILIARY DRAIN  03/20/2018  . IR EXCHANGE BILIARY DRAIN  04/17/2018  . IR EXCHANGE BILIARY DRAIN  05/29/2018  . IR EXCHANGE BILIARY DRAIN  07/24/2018  . IR PERC CHOLECYSTOSTOMY  01/27/2017  . IR RADIOLOGIST EVAL & MGMT  03/05/2017  . MASTECTOMY, PARTIAL Right     No Known Allergies  Outpatient Encounter Medications as of 09/11/2018  Medication Sig  . acetaminophen (TYLENOL) 325 MG tablet Take 650 mg by mouth every 4 (four) hours as needed.  . calcium carbonate (TUMS) 500 MG chewable tablet Chew 1 tablet by mouth daily.  . Cholecalciferol 1000 units tablet Take 1,000 Units by mouth at bedtime.   . Cyanocobalamin (VITAMIN B-12) 1000 MCG SUBL Place 1 tablet under the tongue at bedtime.  . donepezil (ARICEPT) 10 MG tablet Take 1 tablet by mouth at bedtime.  . polyethylene glycol (MIRALAX / GLYCOLAX) packet Take 17 g by mouth  daily.  . Sodium Chloride Flush (NORMAL SALINE FLUSH) 0.9 % SOLN Flush cholecystostomy tube once every other  day with 5 ml and then recap   No facility-administered encounter medications on file as of 09/11/2018.      Review of Systems  Review of Systems  Constitutional: Negative for activity change, appetite change, chills, diaphoresis, fatigue and fever.  HENT: Negative for mouth sores, postnasal drip, rhinorrhea, sinus pain and sore throat.   Respiratory: Negative for apnea, cough, chest  tightness, shortness of breath and wheezing.   Cardiovascular: Negative for chest pain, palpitations and leg swelling.  Gastrointestinal: Negative for abdominal distention, abdominal pain, constipation, diarrhea, nausea and vomiting.  Genitourinary: Negative for dysuria and frequency.  Musculoskeletal: Negative for arthralgias, joint swelling and myalgias.  Skin: Negative for rash.  Neurological: Negative for dizziness, syncope, weakness, light-headedness and numbness.  Psychiatric/Behavioral: Negative for behavioral problems, confusion and sleep disturbance.     Immunization History  Administered Date(s) Administered  . Influenza-Unspecified 07/28/2014, 07/20/2016, 07/26/2017  . Pneumococcal Conjugate-13 06/21/2017  . Pneumococcal-Unspecified 08/01/2016  . Tdap 07/11/2017   Pertinent  Health Maintenance Due  Topic Date Due  . INFLUENZA VACCINE  Completed  . DEXA SCAN  Completed  . PNA vac Low Risk Adult  Completed   Fall Risk  07/21/2018 07/11/2017  Falls in the past year? No No   Functional Status Survey:    Vitals:   09/11/18 1058  BP: 123/69  Pulse: 74  Resp: 20  Temp: (!) 97 F (36.1 C)  TempSrc: Oral  SpO2: 96%  Weight: 154 lb (69.9 kg)  Height: 5\' 5"  (1.651 m)   Body mass index is 25.63 kg/m. Physical Exam  Constitutional: She appears well-developed and well-nourished.  HENT:  Head: Normocephalic.  Mouth/Throat: Oropharynx is clear and moist.  Eyes: Pupils are equal, round, and reactive to light.  Neck: Neck supple.  Cardiovascular: Normal rate and regular rhythm.  Pulmonary/Chest: Effort normal and breath sounds normal.  Abdominal: Soft. Bowel sounds are normal. She exhibits no distension. There is no tenderness. There is no guarding.  Drain Placed in RUQ with no tenderness  Musculoskeletal: She exhibits no edema.  Neurological: She is alert.  Not oriented. Walks in the facility with the walker.  Skin: Skin is warm and dry.  Psychiatric: She has a  normal mood and affect. Her behavior is normal. Thought content normal.    Labs reviewed: Recent Labs    01/02/18 0415 04/08/18 0719 08/15/18 0712  NA 143 139 139  K 4.2 4.2 4.0  CL 108 104 104  CO2 26 26 27   GLUCOSE 91 100* 107*  BUN 17 18 16   CREATININE 0.86 0.89 0.89  CALCIUM 8.9 9.1 8.9   Recent Labs    10/09/17 0910 04/08/18 0719 08/15/18 0712  AST 34 20 20  ALT 22 14 15   ALKPHOS 76 71 73  BILITOT 0.8 0.4 0.5  PROT 7.5 7.6 7.3  ALBUMIN 3.5 3.7 3.7   Recent Labs    02/14/18 0732 02/15/18 0900 04/08/18 0719 08/15/18 0712  WBC 6.3 6.1 7.6 6.3  NEUTROABS 3.3 3.4  --  3.3  HGB 11.5* 14.1 13.4 12.7  HCT 36.2 43.2 41.3 41.9  MCV 94.5 94.5 95.4 97.2  PLT 230 245 256 260   Lab Results  Component Value Date   TSH 3.877 01/02/2018   No results found for: HGBA1C No results found for: CHOL, HDL, LDLCALC, LDLDIRECT, TRIG, CHOLHDL  Significant Diagnostic Results in last 30 days:  No results found.  Assessment/Plan S/P percutaneous cholecystostomy drain placement Patient had a repeat CAT scan 12/18 done in the emergency room and did not show any acute changes. Her liver enzymes have been normal in 10/19 She has been gaining weight and has been largely asymptomatic. Her drain is changed by Radiology  every few Months No Change.  Dementia She is on Aricept and supportive care      Family/ staff Communication:   Labs/tests ordered:   Total time spent in this patient care encounter was 25_ minutes; greater than 50% of the visit spent counseling patient, reviewing records , Labs and coordinating care for problems addressed at this encounter.

## 2018-09-22 ENCOUNTER — Encounter (HOSPITAL_COMMUNITY): Payer: Self-pay | Admitting: Interventional Radiology

## 2018-09-22 ENCOUNTER — Inpatient Hospital Stay (HOSPITAL_COMMUNITY)
Admit: 2018-09-22 | Discharge: 2018-09-22 | DRG: 949 | Disposition: A | Discharge status: Home / Self Care | Payer: Medicare Other | Source: Ambulatory Visit | Attending: Interventional Radiology | Admitting: Interventional Radiology

## 2018-09-22 DIAGNOSIS — Z4682 Encounter for fitting and adjustment of non-vascular catheter: Secondary | ICD-10-CM

## 2018-09-22 DIAGNOSIS — K810 Acute cholecystitis: Secondary | ICD-10-CM | POA: Diagnosis present

## 2018-09-22 DIAGNOSIS — Z434 Encounter for attention to other artificial openings of digestive tract: Secondary | ICD-10-CM | POA: Diagnosis not present

## 2018-09-22 HISTORY — PX: IR EXCHANGE BILIARY DRAIN: IMG6046

## 2018-09-22 MED ORDER — IOPAMIDOL (ISOVUE-300) INJECTION 61%
INTRAVENOUS | Status: AC
  Administered 2018-09-22: 10 mL
  Filled 2018-09-22: qty 50

## 2018-09-22 MED ORDER — LIDOCAINE HCL 1 % IJ SOLN
INTRAMUSCULAR | Status: AC
  Filled 2018-09-22: qty 20

## 2018-09-22 MED ORDER — LIDOCAINE HCL (PF) 1 % IJ SOLN
INTRAMUSCULAR | Status: AC | PRN
  Administered 2018-09-22: 10 mL

## 2018-09-22 MED ORDER — SODIUM CHLORIDE 0.9% FLUSH: 5.0000 mL | Freq: Three times a day (TID) | INTRAVENOUS | Status: DC

## 2018-09-22 NOTE — Procedures (Signed)
Interventional Radiology Procedure Note  Procedure: Routine exchange of perc chole.  New 34F drain. Complications: None Recommendations:  - to gravity - Do not submerge - Routine care   Signed,  Dulcy Fanny. Earleen Newport, DO

## 2018-10-14 ENCOUNTER — Encounter: Payer: Self-pay | Admitting: Internal Medicine

## 2018-10-14 ENCOUNTER — Non-Acute Institutional Stay (SKILLED_NURSING_FACILITY): Payer: Medicare Other | Admitting: Internal Medicine

## 2018-10-14 DIAGNOSIS — S12191D Other nondisplaced fracture of second cervical vertebra, subsequent encounter for fracture with routine healing: Secondary | ICD-10-CM

## 2018-10-14 DIAGNOSIS — E538 Deficiency of other specified B group vitamins: Secondary | ICD-10-CM

## 2018-10-14 DIAGNOSIS — F0390 Unspecified dementia without behavioral disturbance: Secondary | ICD-10-CM | POA: Diagnosis not present

## 2018-10-14 DIAGNOSIS — Z434 Encounter for attention to other artificial openings of digestive tract: Secondary | ICD-10-CM | POA: Diagnosis not present

## 2018-10-14 DIAGNOSIS — K8001 Calculus of gallbladder with acute cholecystitis with obstruction: Secondary | ICD-10-CM | POA: Diagnosis not present

## 2018-10-14 NOTE — Progress Notes (Signed)
Location:    [facility] Room Number: 141/D Place of Service:  SNF [phone]) Provider:  [place] Lewis PA-C  Cynthia Dad, MD  Patient Care Team: Cynthia Dad, MD as PCP - General (Internal Medicine) Cynthia [name] as Physician Assistant (Internal Medicine)  Extended Emergency Contact Information Primary Emergency Contact: Cynthia [name],  [address] [place] of [place] Phone: [phone] Relation: Friend Secondary Emergency Contact: Cynthia [name]  [place] Phone: [phone] Relation: Daughter  Code Status:  Full Code Goals of care: Advanced Directive information Advanced Directives 10/14/2018  Does Patient Have a Medical Advance Directive? Yes  Type of Advance Directive (No Data)  Does patient want to make changes to medical advance directive? No - Patient declined  Copy of [place] in Chart? -  Would patient like information on creating a medical advance directive? No - Patient declined     Chief Complaint  Patient presents with  . Medical Management of Chronic Issues    Routine visit of medical management  Medical management of chronic medical conditions including dementia- history of cholecystitis- history of C2 fracture- as well as history of breast cancer status post modified mastectomy and a history of a right hemicolectomy distant past.    HPI:  Pt is a 84 y.o. female seen today for medical management of chronic diseases.  As noted above.  She continues to have an extended period of stability.  Weight is stable at 154 pounds.  Most recent acute issue was back in April 2018 she was diagnosed with acute cholecystitis-she was thought high risk for surgery and had a percutaneous cholecystectomy drain placed by interventional radiology--this is changed by interventional radiology on an as-needed basis.  She appears to have tolerated this well at one point  complained quite a bit about it but appears to have adapted here.  She continues to ambulate about the facility in her walker and does not have any complaints this evening.  She does have a history of dementia this appears to be moderate she is on Aricept and appears to be tolerating this well.  She also has a history of a cervical neck fracture in the distant past at one point had a neck collar this has long since been removed and this appears to be stable.     Past Medical History:  Diagnosis Date  . Breast cancer ([place])   . Cognitive communication deficit   . Dementia ([facility])   . Difficulty in walking(719.7)   . Fall   . Muscle weakness (generalized)   . Urinary tract infection, site not specified   . Vitamin B deficiency    Past Surgical History:  Procedure Laterality Date  . COLON SURGERY    . IR CATHETER TUBE CHANGE  05/07/2017  . IR EXCHANGE BILIARY DRAIN  07/02/2017  . IR EXCHANGE BILIARY DRAIN  07/10/2017  . IR EXCHANGE BILIARY DRAIN  08/28/2017  . IR EXCHANGE BILIARY DRAIN  10/11/2017  . IR EXCHANGE BILIARY DRAIN  10/28/2017  . IR EXCHANGE BILIARY DRAIN  12/23/2017  . IR EXCHANGE BILIARY DRAIN  01/23/2018  . IR EXCHANGE BILIARY DRAIN  02/17/2018  . IR EXCHANGE BILIARY DRAIN  03/20/2018  . IR EXCHANGE BILIARY DRAIN  04/17/2018  . IR EXCHANGE BILIARY DRAIN  05/29/2018  . IR EXCHANGE BILIARY DRAIN  07/24/2018  . IR EXCHANGE BILIARY DRAIN  09/22/2018  . IR PERC CHOLECYSTOSTOMY  01/27/2017  . IR RADIOLOGIST EVAL & MGMT  03/05/2017  . MASTECTOMY, PARTIAL Right     No Known Allergies  Outpatient Encounter Medications as of 10/14/2018  Medication Sig  . acetaminophen (TYLENOL) 325 MG tablet Take 650 mg by mouth every 4 (four) hours as needed.  . calcium carbonate (TUMS) 500 MG chewable tablet Chew 1 tablet by mouth daily.  . Cholecalciferol 1000 units tablet Take 1,000 Units by mouth at bedtime.   . Cyanocobalamin (VITAMIN B-12) 1000 MCG SUBL Place 1 tablet under the tongue at bedtime.    . donepezil (ARICEPT) 10 MG tablet Take 1 tablet by mouth at bedtime.  . polyethylene glycol (MIRALAX / GLYCOLAX) packet Take 17 g by mouth daily.  . Sodium Chloride Flush (NORMAL SALINE FLUSH) 0.9 % SOLN Flush cholecystostomy tube once every other  day with 5 ml and then recap   No facility-administered encounter medications on file as of 10/14/2018.      Review of Systems  This is somewhat limited secondary to dementia.  In general she not complain of any fever chills her weight appears to be stable.  Skin is not complaining of any rashes or itching.  Head ears eyes nose mouth and throat she has prescription lenses but does not complain of any visual changes or sore throat or difficulty swallowing.  Respiratory denies shortness of breath or cough.  Cardiac denies chest pain- she has baseline mild lower extremity edema.  GI is not complaining of abdominal discomfort nausea vomiting diarrhea constipation.  GU is not complaining of dysuria.  Musculoskeletal moves all her extremities x4 at baseline she does stand without assistance and ambulate with a walker.  Neurologic does not complain of dizziness headache syncope or numbness.  And psych does not complain of being depressed or anxious- she often likes to be left alone but this is not new.    Immunization History  Administered Date(s) Administered  . Influenza-Unspecified 07/28/2014, 07/20/2016, 07/26/2017  . Pneumococcal Conjugate-13 06/21/2017  . Pneumococcal-Unspecified 08/01/2016  . Tdap 07/11/2017   Pertinent  Health Maintenance Due  Topic Date Due  . INFLUENZA VACCINE  Completed  . DEXA SCAN  Completed  . PNA vac Low Risk Adult  Completed   Fall Risk  07/21/2018 07/11/2017  Falls in the past year? No No   Functional Status Survey:    Vitals:   10/14/18 1603  BP: 133/71  Pulse: (!) 54  Resp: 18  Temp: (!) 97.1 F (36.2 C)  TempSrc: Oral  SpO2: 96%  Weight: 153 lb 12.8 oz (69.8 kg)  Height: 5\' 3"   (1.6 m)  Pulse on auscultation was in the 60s Body mass index is 27.24 kg/m. Physical Exam In general this is a pleasant elderly female in no distress sitting as usual in her chair.  Her skin is warm and dry.  Eyes she has prescription lenses visual acuity appears to be intact sclera and conjunctive are clear.  Oropharynx is clear mucous membranes moist.  Chest is clear to auscultation there is no labored breathing.  Heart is regular rate and rhythm without murmur gallop or rub she has mild baseline lower extremity edema.  Abdomen is soft nontender with positive bowel sounds she does have covering over the drain insertion right side of abdomen it is draining tan-colored fluid into the bag.  Musculoskeletal is able to move all extremities x4 at baseline as noted above she continues to ambulate with a walker and does quite well with this.  Neurologic is  grossly intact her speech is clear could not really appreciate lateralizing findings.  Psych she is oriented to self follow simple verbal commands without difficulty continues to be pleasant but does indicate at times she does not really want to be bothered.   Labs reviewed: Recent Labs    01/02/18 0415 04/08/18 0719 08/15/18 0712  NA 143 139 139  K 4.2 4.2 4.0  CL 108 104 104  CO2 26 26 27   GLUCOSE 91 100* 107*  BUN 17 18 16   CREATININE 0.86 0.89 0.89  CALCIUM 8.9 9.1 8.9   Recent Labs    04/08/18 0719 08/15/18 0712  AST 20 20  ALT 14 15  ALKPHOS 71 73  BILITOT 0.4 0.5  PROT 7.6 7.3  ALBUMIN 3.7 3.7   Recent Labs    02/14/18 0732 02/15/18 0900 04/08/18 0719 08/15/18 0712  WBC 6.3 6.1 7.6 6.3  NEUTROABS 3.3 3.4  --  3.3  HGB 11.5* 14.1 13.4 12.7  HCT 36.2 43.2 41.3 41.9  MCV 94.5 94.5 95.4 97.2  PLT 230 245 256 260   Lab Results  Component Value Date   TSH 3.877 01/02/2018   No results found for: HGBA1C No results found for: CHOL, HDL, LDLCALC, LDLDIRECT, TRIG, CHOLHDL  Significant Diagnostic  Results in last 30 days:  Ir Exchange Biliary Drain  Result Date: 09/22/2018 INDICATION: 84 year old female with a history of percutaneous cholecystostomy. She presents for routine exchange EXAM: ROUTINE IMAGE GUIDED EXCHANGE OF PERCUTANEOUS CHOLECYSTOSTOMY MEDICATIONS: None ANESTHESIA/SEDATION: None FLUOROSCOPY TIME:  Fluoroscopy Time: 0 minutes 18 seconds (1 mGy). COMPLICATIONS: None PROCEDURE: Informed written consent was obtained from the patient after a thorough discussion of the procedural risks, benefits and alternatives. All questions were addressed. Maximal Sterile Barrier Technique was utilized including caps, mask, sterile gowns, sterile gloves, sterile drape, hand hygiene and skin antiseptic. A timeout was performed prior to the initiation of the procedure. Patient positioned supine position on the fluoroscopy table. 1% lidocaine was used for local anesthesia. Modified Seldinger technique was used to replace the percutaneous cholecystostomy. Final image was stored. Drain was sutured in position attached to gravity drainage. Patient tolerated the procedure well and remained hemodynamically stable throughout. No complications were encountered and no significant blood loss. IMPRESSION: Status post routine percutaneous cholecystostomy exchange. Signed, Dulcy Fanny. Dellia Nims, RPVI [facility] Radiology Electronically Signed   By: Corrie Mckusick D.O.   On: 09/22/2018 13:11    Assessment/Plan  #1- history of dementia she appears to be doing well with supportive care her weight is stable nursing does not report any issues it appears she does participate in activities- she does continue on Aricept.  2.-History of acute cholecystitis she does have a drain at this point and appears to be tolerating this she is followed by interventional radiology as needed---currently she is asymptomatic  #3 history of right hemicolectomy this is distant past and she does  not report any recent GI issues other than of course the cholecystitis last year.  4.-  History of C2 fracture at this point she appears to have made an appropriate recovery status post neck collar.  5.  History of B12 deficiency B12 level was 741 on lab done in April.  6.  History of vitamin D deficiency vitamin D level was 41.6 in April as well.  [id number]

## 2018-10-15 ENCOUNTER — Encounter: Payer: Self-pay | Admitting: Internal Medicine

## 2018-11-17 ENCOUNTER — Other Ambulatory Visit (HOSPITAL_COMMUNITY): Payer: Medicare Other

## 2018-11-20 ENCOUNTER — Encounter (HOSPITAL_COMMUNITY): Payer: Self-pay | Admitting: Interventional Radiology

## 2018-11-20 ENCOUNTER — Ambulatory Visit (HOSPITAL_COMMUNITY)
Admission: RE | Admit: 2018-11-20 | Discharge: 2018-11-20 | Disposition: A | Discharge status: Home / Self Care | Payer: Medicare Other | Source: Ambulatory Visit | Attending: Interventional Radiology | Admitting: Interventional Radiology

## 2018-11-20 DIAGNOSIS — Z4803 Encounter for change or removal of drains: Secondary | ICD-10-CM | POA: Insufficient documentation

## 2018-11-20 DIAGNOSIS — K811 Chronic cholecystitis: Secondary | ICD-10-CM | POA: Insufficient documentation

## 2018-11-20 HISTORY — PX: IR EXCHANGE BILIARY DRAIN: IMG6046

## 2018-11-20 MED ORDER — LIDOCAINE HCL 1 % IJ SOLN
INTRAMUSCULAR | Status: DC | PRN
  Administered 2018-11-20: 10 mL

## 2018-11-20 MED ORDER — LIDOCAINE HCL 1 % IJ SOLN
INTRAMUSCULAR | Status: AC
  Filled 2018-11-20: qty 20

## 2018-11-20 MED ORDER — SODIUM CHLORIDE 0.9% FLUSH: 5.0000 mL | Freq: Three times a day (TID) | INTRAVENOUS | Status: DC

## 2018-11-20 MED ORDER — IOPAMIDOL (ISOVUE-300) INJECTION 61%
INTRAVENOUS | Status: AC
  Administered 2018-11-20: 15 mL
  Filled 2018-11-20: qty 50

## 2018-11-20 NOTE — Procedures (Signed)
Interventional Radiology Procedure Note  Procedure: routine exchange perc chole tube. New 49f tube..  Complications: None Recommendations:  - Routine exchange - Do not submerge - Routine care   Signed,  Dulcy Fanny. Earleen Newport, DO

## 2018-12-15 ENCOUNTER — Non-Acute Institutional Stay (SKILLED_NURSING_FACILITY): Payer: Medicare Other | Admitting: Adult Health

## 2018-12-15 ENCOUNTER — Encounter: Payer: Self-pay | Admitting: Adult Health

## 2018-12-15 DIAGNOSIS — K8001 Calculus of gallbladder with acute cholecystitis with obstruction: Secondary | ICD-10-CM | POA: Diagnosis not present

## 2018-12-15 DIAGNOSIS — F0390 Unspecified dementia without behavioral disturbance: Secondary | ICD-10-CM

## 2018-12-15 DIAGNOSIS — I10 Essential (primary) hypertension: Secondary | ICD-10-CM | POA: Diagnosis not present

## 2018-12-15 DIAGNOSIS — C50911 Malignant neoplasm of unspecified site of right female breast: Secondary | ICD-10-CM

## 2018-12-15 DIAGNOSIS — K5909 Other constipation: Secondary | ICD-10-CM | POA: Diagnosis not present

## 2018-12-15 DIAGNOSIS — Z434 Encounter for attention to other artificial openings of digestive tract: Secondary | ICD-10-CM | POA: Diagnosis not present

## 2018-12-15 NOTE — Progress Notes (Signed)
Location:   [place] Room Number: 141 D Place of Service:  SNF (31)   CODE STATUS: Full Code  No Known Allergies  Chief Complaint  Patient presents with  . Medical Management of Chronic Issues    Essential hypertension; chronic constipation; dementia without behavioral disturbance unspecified dementia type.     HPI:  She is a 85 year old long term resident of this facility being seen for the management of her chronic illnesses: hypertension; constipation; dementia. She denies any uncontrolled pain; no constipation; no abdominal pain; no nausea or vomiting.   Past Medical History:  Diagnosis Date  . Breast cancer ([place])   . Cognitive communication deficit   . Dementia ([place])   . Difficulty in walking(719.7)   . Fall   . Muscle weakness (generalized)   . Urinary tract infection, site not specified   . Vitamin B deficiency     Past Surgical History:  Procedure Laterality Date  . COLON SURGERY    . IR CATHETER TUBE CHANGE  05/07/2017  . IR EXCHANGE BILIARY DRAIN  07/02/2017  . IR EXCHANGE BILIARY DRAIN  07/10/2017  . IR EXCHANGE BILIARY DRAIN  08/28/2017  . IR EXCHANGE BILIARY DRAIN  10/11/2017  . IR EXCHANGE BILIARY DRAIN  10/28/2017  . IR EXCHANGE BILIARY DRAIN  12/23/2017  . IR EXCHANGE BILIARY DRAIN  01/23/2018  . IR EXCHANGE BILIARY DRAIN  02/17/2018  . IR EXCHANGE BILIARY DRAIN  03/20/2018  . IR EXCHANGE BILIARY DRAIN  04/17/2018  . IR EXCHANGE BILIARY DRAIN  05/29/2018  . IR EXCHANGE BILIARY DRAIN  07/24/2018  . IR EXCHANGE BILIARY DRAIN  09/22/2018  . IR EXCHANGE BILIARY DRAIN  11/20/2018  . IR PERC CHOLECYSTOSTOMY  01/27/2017  . IR RADIOLOGIST EVAL & MGMT  03/05/2017  . MASTECTOMY, PARTIAL Right     Social History   Socioeconomic History  . Marital status: Widowed    Spouse name: Not on file  . Number of children: Not on file  . Years of education: Not on file  . Highest education level: Not on file  Occupational History  . Not on file    Social Needs  . Financial resource strain: Not hard at all  . Food insecurity:    Worry: Never true    Inability: Never true  . Transportation needs:    Medical: No    Non-medical: No  Tobacco Use  . Smoking status: Never Smoker  . Smokeless tobacco: Never Used  Substance and Sexual Activity  . Alcohol use: No  . Drug use: No  . Sexual activity: Not on file  Lifestyle  . Physical activity:    Days per week: 0 days    Minutes per session: 0 min  . Stress: Not at all  Relationships  . Social connections:    Talks on phone: Never    Gets together: Never    Attends religious service: Never    Active member of club or organization: No    Attends meetings of clubs or organizations: Never    Relationship status: Widowed  . Intimate partner violence:    Fear of current or ex partner: No    Emotionally abused: No    Physically abused: No    Forced sexual activity: No  Other Topics Concern  . Not on file  Social History Narrative  . Not on file   Family History  Problem Relation Age of Onset  . Cancer Father        ?  primary  . Diabetes Neg Hx   . Heart disease Neg Hx   . Stroke Neg Hx       VITAL SIGNS BP 128/76   Pulse 75   Temp (!) 96.8 F (36 C)   Resp 20   Ht 5\' 3"  (1.6 m)   Wt 153 lb 12.8 oz (69.8 kg)   BMI 27.24 kg/m   Outpatient Encounter Medications as of 12/15/2018  Medication Sig  . acetaminophen (TYLENOL) 325 MG tablet Take 650 mg by mouth every 4 (four) hours as needed.  . calcium carbonate (TUMS) 500 MG chewable tablet Chew 1 tablet by mouth daily.  . Cholecalciferol 1000 units tablet Take 1,000 Units by mouth at bedtime.   . Cyanocobalamin (VITAMIN B-12) 1000 MCG SUBL Place 1 tablet under the tongue at bedtime.  . donepezil (ARICEPT) 10 MG tablet Take 1 tablet by mouth at bedtime.  . Nutritional Supplements (NUTRITIONAL SUPPLEMENT PO) Magic Cup at bedtime for supplement  . oseltamivir (TAMIFLU) 30 MG capsule Take 30 mg by mouth at bedtime.  .  polyethylene glycol (MIRALAX / GLYCOLAX) packet Take 17 g by mouth daily as needed.   . Sodium Chloride Flush (NORMAL SALINE FLUSH) 0.9 % SOLN Flush cholecystostomy tube once every other  day with 5 ml and then recap   No facility-administered encounter medications on file as of 12/15/2018.      SIGNIFICANT DIAGNOSTIC EXAMS  LABS REVIEWED: TODAY:   08-15-18: wbc 6.3 hgb 12.7; hct 41.9; mcv 97.2 plt 260; glucose 107; bun 16; creat 0.89; k+ 4.0; na++ 139; ca 8.9 liver normal albumin 3.7   Review of Systems  Constitutional: Negative for malaise/fatigue.  Respiratory: Negative for cough and shortness of breath.   Cardiovascular: Negative for chest pain, palpitations and leg swelling.  Gastrointestinal: Negative for abdominal pain, constipation and heartburn.  Musculoskeletal: Negative for back pain, joint pain and myalgias.  Skin: Negative.   Neurological: Negative for dizziness.  Psychiatric/Behavioral: The patient is not nervous/anxious.     Physical Exam Constitutional:      General: She is not in acute distress.    Appearance: She is well-developed. She is not diaphoretic.  Neck:     Musculoskeletal: Neck supple.     Thyroid: No thyromegaly.  Cardiovascular:     Rate and Rhythm: Normal rate and regular rhythm.     Pulses: Normal pulses.     Heart sounds: Normal heart sounds.  Pulmonary:     Effort: Pulmonary effort is normal. No respiratory distress.     Breath sounds: Normal breath sounds.  Abdominal:     General: Bowel sounds are normal. There is no distension.     Palpations: Abdomen is soft.     Tenderness: There is no abdominal tenderness.     Comments: Bili drain right lower quad  History of right hemi colectomy   Musculoskeletal:     Right lower leg: Edema present.     Left lower leg: Edema present.     Comments: Trace bilateral lower extremity edema Is able to move all extremities  History of C2 fracture   Lymphadenopathy:     Cervical: No cervical  adenopathy.  Skin:    General: Skin is warm and dry.     Comments: History of partial right mastectomy   Neurological:     Mental Status: She is alert. Mental status is at baseline.  Psychiatric:        Mood and Affect: Mood normal.  ASSESSMENT/ PLAN:  TODAY;   1. Essential hypertension: is stable b/p 128/76: will monitor  2.  Chronic constipation: is stable will continue miralax daily as needed  3.  Dementia without behavioral disturbance unspecified dementia type: is without change weight is 153 pounds; will continue aricept 10 mg daily  3. Calculus of gallbladder with acute cholecystitis and obstruction: S/P cholecystostomy (with multiple replacements): is stable will continue to monitor her status.   4. Malignant neoplasm of right female breast unspecified estrogen receptor status unspecified site of breast. Is status post right partial mastectomy: is stable will monitor        MD is aware of resident's narcotic use and is in agreement with current plan of care. We will attempt to wean resident as apropriate   Ok Edwards NP [facility]  Contact [phone] Monday through Friday 8am- 5pm  After hours call [phone]

## 2018-12-20 DIAGNOSIS — K5909 Other constipation: Secondary | ICD-10-CM | POA: Insufficient documentation

## 2019-01-08 ENCOUNTER — Inpatient Hospital Stay (HOSPITAL_COMMUNITY): Admit: 2019-01-08 | Payer: Medicare Other

## 2019-01-08 ENCOUNTER — Telehealth (HOSPITAL_COMMUNITY): Payer: Self-pay | Admitting: Radiology

## 2019-01-08 NOTE — Telephone Encounter (Signed)
Called pt's daughter, left VM that her 12:00 pm appt today (01/08/19) is being cancelled and to call back to reschedule. JM

## 2019-01-12 ENCOUNTER — Encounter: Payer: Self-pay | Admitting: Adult Health

## 2019-01-12 ENCOUNTER — Non-Acute Institutional Stay (SKILLED_NURSING_FACILITY): Payer: Medicare Other | Admitting: Adult Health

## 2019-01-12 DIAGNOSIS — F0390 Unspecified dementia without behavioral disturbance: Secondary | ICD-10-CM | POA: Diagnosis not present

## 2019-01-12 DIAGNOSIS — K5909 Other constipation: Secondary | ICD-10-CM

## 2019-01-12 DIAGNOSIS — I10 Essential (primary) hypertension: Secondary | ICD-10-CM

## 2019-01-12 NOTE — Progress Notes (Signed)
Location:   [place] Room Number: 141 D Place of Service:  SNF (31)   CODE STATUS: Full Code  No Known Allergies  Chief Complaint  Patient presents with  . Medical Management of Chronic Issues    Essential hypertension; chronic constipation; dementia without behavioral disturbance unspecified dementia type     HPI:  She is a 85 year old long term resident of this facility being seen for the management of her chronic illnesses: hypertension; constipation; dementia. Her weight is stable. She denies any controlled pain; no changes in appetite; no anxiety or depressive thoughts. There are no reports of fevers present.   Past Medical History:  Diagnosis Date  . Breast cancer ([place])   . Cognitive communication deficit   . Dementia ([place])   . Difficulty in walking(719.7)   . Fall   . Muscle weakness (generalized)   . Urinary tract infection, site not specified   . Vitamin B deficiency     Past Surgical History:  Procedure Laterality Date  . COLON SURGERY    . IR CATHETER TUBE CHANGE  05/07/2017  . IR EXCHANGE BILIARY DRAIN  07/02/2017  . IR EXCHANGE BILIARY DRAIN  07/10/2017  . IR EXCHANGE BILIARY DRAIN  08/28/2017  . IR EXCHANGE BILIARY DRAIN  10/11/2017  . IR EXCHANGE BILIARY DRAIN  10/28/2017  . IR EXCHANGE BILIARY DRAIN  12/23/2017  . IR EXCHANGE BILIARY DRAIN  01/23/2018  . IR EXCHANGE BILIARY DRAIN  02/17/2018  . IR EXCHANGE BILIARY DRAIN  03/20/2018  . IR EXCHANGE BILIARY DRAIN  04/17/2018  . IR EXCHANGE BILIARY DRAIN  05/29/2018  . IR EXCHANGE BILIARY DRAIN  07/24/2018  . IR EXCHANGE BILIARY DRAIN  09/22/2018  . IR EXCHANGE BILIARY DRAIN  11/20/2018  . IR PERC CHOLECYSTOSTOMY  01/27/2017  . IR RADIOLOGIST EVAL & MGMT  03/05/2017  . MASTECTOMY, PARTIAL Right     Social History   Socioeconomic History  . Marital status: Widowed    Spouse name: Not on file  . Number of children: Not on file  . Years of education: Not on file  . Highest education  level: Not on file  Occupational History  . Not on file  Social Needs  . Financial resource strain: Not hard at all  . Food insecurity:    Worry: Never true    Inability: Never true  . Transportation needs:    Medical: No    Non-medical: No  Tobacco Use  . Smoking status: Never Smoker  . Smokeless tobacco: Never Used  Substance and Sexual Activity  . Alcohol use: No  . Drug use: No  . Sexual activity: Not on file  Lifestyle  . Physical activity:    Days per week: 0 days    Minutes per session: 0 min  . Stress: Not at all  Relationships  . Social connections:    Talks on phone: Never    Gets together: Never    Attends religious service: Never    Active member of club or organization: No    Attends meetings of clubs or organizations: Never    Relationship status: Widowed  . Intimate partner violence:    Fear of current or ex partner: No    Emotionally abused: No    Physically abused: No    Forced sexual activity: No  Other Topics Concern  . Not on file  Social History Narrative  . Not on file   Family History  Problem Relation Age  of Onset  . Cancer Father        ? primary  . Diabetes Neg Hx   . Heart disease Neg Hx   . Stroke Neg Hx       VITAL SIGNS BP (!) 107/59   Pulse 71   Temp 98 F (36.7 C)   Resp 16   Ht 5\' 3"  (1.6 m)   Wt 153 lb 9.6 oz (69.7 kg)   BMI 27.21 kg/m   Outpatient Encounter Medications as of 01/12/2019  Medication Sig  . acetaminophen (TYLENOL) 325 MG tablet Take 650 mg by mouth every 4 (four) hours as needed.  . calcium carbonate (TUMS) 500 MG chewable tablet Chew 1 tablet by mouth daily.  . Cholecalciferol 1000 units tablet Take 1,000 Units by mouth at bedtime.   . Cyanocobalamin (VITAMIN B-12) 1000 MCG SUBL Place 1 tablet under the tongue at bedtime.  . donepezil (ARICEPT) 10 MG tablet Take 1 tablet by mouth at bedtime.  . NON FORMULARY Diet type:  NAS  . Nutritional Supplements (NUTRITIONAL SUPPLEMENT PO) Magic Cup at bedtime  for supplement  . polyethylene glycol (MIRALAX / GLYCOLAX) packet Take 17 g by mouth daily as needed.   . Sodium Chloride Flush (NORMAL SALINE FLUSH) 0.9 % SOLN Flush cholecystostomy tube once every other  day with 5 ml and then recap   No facility-administered encounter medications on file as of 01/12/2019.      SIGNIFICANT DIAGNOSTIC EXAMS   LABS REVIEWED: PREVIOUS:   08-15-18: wbc 6.3 hgb 12.7; hct 41.9; mcv 97.2 plt 260; glucose 107; bun 16; creat 0.89; k+ 4.0; na++ 139; ca 8.9 liver normal albumin 3.7  NO NEW LABS.   Review of Systems  Constitutional: Negative for malaise/fatigue.  Respiratory: Negative for cough and shortness of breath.   Cardiovascular: Negative for chest pain, palpitations and leg swelling.  Gastrointestinal: Negative for abdominal pain, constipation and heartburn.  Musculoskeletal: Negative for back pain, joint pain and myalgias.  Skin: Negative.   Neurological: Negative for dizziness.  Psychiatric/Behavioral: The patient is not nervous/anxious.     Physical Exam Constitutional:      General: She is not in acute distress.    Appearance: She is well-developed. She is not diaphoretic.  Neck:     Thyroid: No thyromegaly.  Cardiovascular:     Rate and Rhythm: Normal rate and regular rhythm.     Heart sounds: Normal heart sounds.  Pulmonary:     Effort: Pulmonary effort is normal. No respiratory distress.     Breath sounds: Normal breath sounds.  Abdominal:     General: Bowel sounds are normal. There is no distension.     Palpations: Abdomen is soft.     Tenderness: There is no abdominal tenderness.     Comments: Bili drain right lower quad  History of right hemi colectomy    Musculoskeletal:     Right lower leg: Edema present.     Left lower leg: Edema present.     Comments: Trace bilateral lower extremity edema Is able to move all extremities  History of C2 fracture    Lymphadenopathy:     Cervical: No cervical adenopathy.  Skin:     General: Skin is warm and dry.     Comments: History of partial right mastectomy    Neurological:     Mental Status: She is alert. Mental status is at baseline.  Psychiatric:        Mood and Affect: Mood normal.  ASSESSMENT/ PLAN:  TODAY;   1. Essential hypertension is stable b/p 107/59: will monitor   2. Chronic constipation: is stable will continue miralax daily as needed  3. Dementia without behavioral disturbance unspecified dementia type: is without change; weight is 153 pounds; will continue aricept 10 mg daily   PREVIOUS   4. Calculus of gallbladder with acute cholecystitis and obstruction: S/P cholecystostomy (with multiple replacements): is stable will continue to monitor her status.   5. Malignant neoplasm of right female breast unspecified estrogen receptor status unspecified site of breast. Is status post right partial mastectomy: is stable will monitor           MD is aware of resident's narcotic use and is in agreement with current plan of care. We will attempt to wean resident as apropriate   Ok Edwards NP [facility]  Contact [phone] Monday through Friday 8am- 5pm  After hours call [phone]

## 2019-01-22 ENCOUNTER — Inpatient Hospital Stay (HOSPITAL_COMMUNITY): Admit: 2019-01-22 | Payer: Medicare Other

## 2019-02-11 ENCOUNTER — Ambulatory Visit (HOSPITAL_COMMUNITY)
Admission: RE | Admit: 2019-02-11 | Discharge: 2019-02-11 | Disposition: A | Discharge status: Home / Self Care | Payer: Medicare Other | Source: Ambulatory Visit | Attending: Internal Medicine | Admitting: Internal Medicine

## 2019-02-11 ENCOUNTER — Encounter (HOSPITAL_COMMUNITY): Payer: Self-pay | Admitting: Interventional Radiology

## 2019-02-11 DIAGNOSIS — Z4803 Encounter for change or removal of drains: Secondary | ICD-10-CM | POA: Insufficient documentation

## 2019-02-11 DIAGNOSIS — K819 Cholecystitis, unspecified: Secondary | ICD-10-CM | POA: Insufficient documentation

## 2019-02-11 HISTORY — PX: IR EXCHANGE BILIARY DRAIN: IMG6046

## 2019-02-11 MED ORDER — LIDOCAINE HCL 1 % IJ SOLN
INTRAMUSCULAR | Status: AC
  Filled 2019-02-11: qty 20

## 2019-02-11 MED ORDER — LIDOCAINE HCL (PF) 1 % IJ SOLN
INTRAMUSCULAR | Status: DC | PRN
  Administered 2019-02-11: 5 mL

## 2019-02-16 ENCOUNTER — Non-Acute Institutional Stay (SKILLED_NURSING_FACILITY): Payer: Medicare Other | Admitting: Internal Medicine

## 2019-02-16 ENCOUNTER — Encounter: Payer: Self-pay | Admitting: Internal Medicine

## 2019-02-16 DIAGNOSIS — F0390 Unspecified dementia without behavioral disturbance: Secondary | ICD-10-CM

## 2019-02-16 DIAGNOSIS — Z434 Encounter for attention to other artificial openings of digestive tract: Secondary | ICD-10-CM

## 2019-02-16 DIAGNOSIS — I10 Essential (primary) hypertension: Secondary | ICD-10-CM | POA: Diagnosis not present

## 2019-02-16 NOTE — Progress Notes (Signed)
Location:  [place] Room Number: [place] of Service:  SNF [phone]) Provider:  Veleta Miners, MD  Virgie Dad, MD  Patient Care Team: Virgie Dad, MD as PCP - General (Internal Medicine) Rolm Baptise as Physician Assistant (Internal Medicine)  Extended Emergency Contact Information Primary Emergency Contact: [name], [place] [address] Johnnette Litter of [place] Phone: [phone] Relation: Friend Secondary Emergency Contact: Bokhari,[name]  [place] Phone: [phone] Relation: Daughter  Code Status:  Full code Goals of care: Advanced Directive information Advanced Directives 02/16/2019  Does Patient Have a Medical Advance Directive? No  Type of Advance Directive -  Does patient want to make changes to medical advance directive? No - Patient declined  Copy of [place] in Chart? -  Would patient like information on creating a medical advance directive? No - Patient declined     Chief Complaint  Patient presents with  . Medical Management of Chronic Issues    Hypertension, Dementia    HPI:  Pt is a 85 y.o. female seen today for medical management of chronic diseases.   Patient is long term Resident of facility. Patient has h/o Breast cancer s/p Modified Mastectomy, S/P right hemi colectomy and past history of C2 fracture.and Dementia Patientwas diagnosed with AcuteCholecystitisin 04/18. Since she was considered high risk for surgery she had percutaneous cholecystostomy drain placement 01/27/17 by IR Patient gets her Drain changed by IR as needed. Weight slightly down to 152 lbs Looks slightly more confused today. Was not dressed. But per Nurses she is still doing well with no acute complains. Walks with walker.  Past Medical History:  Diagnosis Date  . Breast cancer ([place])   . Cognitive communication deficit   . Dementia ([place])   . Difficulty in walking(719.7)    . Fall   . Muscle weakness (generalized)   . Urinary tract infection, site not specified   . Vitamin B deficiency    Past Surgical History:  Procedure Laterality Date  . COLON SURGERY    . IR CATHETER TUBE CHANGE  05/07/2017  . IR EXCHANGE BILIARY DRAIN  07/02/2017  . IR EXCHANGE BILIARY DRAIN  07/10/2017  . IR EXCHANGE BILIARY DRAIN  08/28/2017  . IR EXCHANGE BILIARY DRAIN  10/11/2017  . IR EXCHANGE BILIARY DRAIN  10/28/2017  . IR EXCHANGE BILIARY DRAIN  12/23/2017  . IR EXCHANGE BILIARY DRAIN  01/23/2018  . IR EXCHANGE BILIARY DRAIN  02/17/2018  . IR EXCHANGE BILIARY DRAIN  03/20/2018  . IR EXCHANGE BILIARY DRAIN  04/17/2018  . IR EXCHANGE BILIARY DRAIN  05/29/2018  . IR EXCHANGE BILIARY DRAIN  07/24/2018  . IR EXCHANGE BILIARY DRAIN  09/22/2018  . IR EXCHANGE BILIARY DRAIN  11/20/2018  . IR EXCHANGE BILIARY DRAIN  02/11/2019  . IR PERC CHOLECYSTOSTOMY  01/27/2017  . IR RADIOLOGIST EVAL & MGMT  03/05/2017  . MASTECTOMY, PARTIAL Right     No Known Allergies  Outpatient Encounter Medications as of 02/16/2019  Medication Sig  . acetaminophen (TYLENOL) 325 MG tablet Take 650 mg by mouth every 4 (four) hours as needed.  . calcium carbonate (TUMS) 500 MG chewable tablet Chew 1 tablet by mouth daily.  . Cholecalciferol 1000 units tablet Take 1,000 Units by mouth at bedtime.   . Cyanocobalamin (VITAMIN B-12) 1000 MCG SUBL Place 1 tablet under the tongue at bedtime.  . donepezil (ARICEPT) 10 MG tablet  Take 1 tablet by mouth at bedtime.  . NON FORMULARY Diet type:  NAS  . Nutritional Supplements (NUTRITIONAL SUPPLEMENT PO) Magic Cup at bedtime for supplement  . polyethylene glycol (MIRALAX / GLYCOLAX) packet Take 17 g by mouth daily as needed.   . Sodium Chloride Flush (NORMAL SALINE FLUSH) 0.9 % SOLN Flush cholecystostomy tube once every other  day with 5 ml and then recap   No facility-administered encounter medications on file as of 02/16/2019.     Review of Systems  Review of Systems   Constitutional: Negative for activity change, appetite change, chills, diaphoresis, fatigue and fever.  HENT: Negative for mouth sores, postnasal drip, rhinorrhea, sinus pain and sore throat.   Respiratory: Negative for apnea, cough, chest tightness, shortness of breath and wheezing.   Cardiovascular: Negative for chest pain, palpitations and leg swelling.  Gastrointestinal: Negative for abdominal distention, abdominal pain, constipation, diarrhea, nausea and vomiting.  Genitourinary: Negative for dysuria and frequency.  Musculoskeletal: Negative for arthralgias, joint swelling and myalgias.  Skin: Negative for rash.  Neurological: Negative for dizziness, syncope, weakness, light-headedness and numbness.  Psychiatric/Behavioral: Negative for behavioral problems, confusion and sleep disturbance.     Immunization History  Administered Date(s) Administered  . Influenza-Unspecified 07/28/2014, 07/20/2016, 07/26/2017, 07/24/2018  . Pneumococcal Conjugate-13 06/21/2017  . Pneumococcal-Unspecified 08/01/2016  . Tdap 07/11/2017   Pertinent  Health Maintenance Due  Topic Date Due  . INFLUENZA VACCINE  05/23/2019  . DEXA SCAN  Completed  . PNA vac Low Risk Adult  Completed   Fall Risk  07/21/2018 07/11/2017  Falls in the past year? No No   Functional Status Survey:    Vitals:   02/16/19 0923  Weight: 152 lb 12.8 oz (69.3 kg)  Height: 5\' 3"  (1.6 m)   Body mass index is 27.07 kg/m. Physical Exam Constitutional: . Well-developed and well-nourished.  HENT:  Head: Normocephalic.  Mouth/Throat: Oropharynx is clear and moist.  Eyes: Pupils are equal, round, and reactive to light.  Neck: Neck supple.  Cardiovascular: Normal rate and normal heart sounds.  No murmur heard. Pulmonary/Chest: Effort normal and breath sounds normal. No respiratory distress. No wheezes. She has no rales.  Abdominal: Soft. Bowel sounds are normal. No distension. There is no tenderness. There is no rebound.   Musculoskeletal: No edema.  Lymphadenopathy: none Neurological:Oriented to self. Walks with walker and need some assists with her ADLS  Skin: Skin is warm and dry.  Psychiatric: Normal mood and affect. Behavior is normal. Thought content normal.   Labs reviewed: Recent Labs    04/08/18 0719 08/15/18 0712  NA 139 139  K 4.2 4.0  CL 104 104  CO2 26 27  GLUCOSE 100* 107*  BUN 18 16  CREATININE 0.89 0.89  CALCIUM 9.1 8.9   Recent Labs    04/08/18 0719 08/15/18 0712  AST 20 20  ALT 14 15  ALKPHOS 71 73  BILITOT 0.4 0.5  PROT 7.6 7.3  ALBUMIN 3.7 3.7   Recent Labs    04/08/18 0719 08/15/18 0712  WBC 7.6 6.3  NEUTROABS  --  3.3  HGB 13.4 12.7  HCT 41.3 41.9  MCV 95.4 97.2  PLT 256 260   Lab Results  Component Value Date   TSH 3.877 01/02/2018   No results found for: HGBA1C No results found for: CHOL, HDL, LDLCALC, LDLDIRECT, TRIG, CHOLHDL  Significant Diagnostic Results in last 30 days:  Ir Exchange Biliary Drain  Result Date: 02/11/2019 INDICATION: Chronic cholecystostomy tube requiring routine exchange.  MEDICATIONS: None ANESTHESIA/SEDATION: None FLUOROSCOPY TIME:  Fluoroscopy Time: 42 seconds.  10.0 mGy. CONTRAST:  10 mL Omnipaque 153 COMPLICATIONS: None immediate. PROCEDURE: The pre-existing cholecystostomy tube was injected with contrast material. The catheter was cut and removed over a guidewire. A new 10 French cholecystostomy tube was advanced over the wire and formed. Catheter position was confirmed by fluoroscopy. The catheter was attached to a new gravity bag and secured at the skin with a Prolene retention suture and gravity drainage bag. FINDINGS: The gallbladder is decompressed and contains multiple calculi. The cystic duct is open and contrast does enter the common bile duct and duodenum. IMPRESSION: Exchange of indwelling 10 French percutaneous cholecystostomy tube. Electronically Signed   By: Aletta Edouard M.D.   On: 02/11/2019 10:51     Assessment/Plan S/P Cholecystostomy changes every few months By IR. Continues to have Drainage in her tube  Dementia Has had some progression of Disease Looks more confused But still walks with her walker and partially independent in her ADLS Hypertension Controlled  Not on any meds     Family/ staff Communication:   Labs/tests ordered:  Repeat CBC, Hepatic Panel and TSH  Total time spent in this patient care encounter was  25_  minutes; greater than 50% of the visit spent counseling patient and staff, reviewing records , Labs and coordinating care for problems addressed at this encounter.

## 2019-02-17 ENCOUNTER — Encounter (HOSPITAL_COMMUNITY)
Admission: RE | Admit: 2019-02-17 | Discharge: 2019-02-17 | Disposition: A | Discharge status: Home / Self Care | Payer: Medicare Other | Source: Skilled Nursing Facility | Attending: Internal Medicine | Admitting: Internal Medicine

## 2019-02-17 DIAGNOSIS — F0390 Unspecified dementia without behavioral disturbance: Secondary | ICD-10-CM | POA: Insufficient documentation

## 2019-02-17 DIAGNOSIS — E039 Hypothyroidism, unspecified: Secondary | ICD-10-CM | POA: Insufficient documentation

## 2019-02-17 LAB — BASIC METABOLIC PANEL
Anion gap: 7 (ref 5–15)
BUN: 18 mg/dL (ref 8–23)
CO2: 26 mmol/L (ref 22–32)
Calcium: 8.8 mg/dL — ABNORMAL LOW (ref 8.9–10.3)
Chloride: 106 mmol/L (ref 98–111)
Creatinine, Ser: 0.91 mg/dL (ref 0.44–1.00)
GFR calc Af Amer: >60 mL/min (ref >60)
GFR calc non Af Amer: 55 mL/min — ABNORMAL LOW (ref >60)
Glucose, Bld: 92 mg/dL (ref 70–99)
Potassium: 4.3 mmol/L (ref 3.5–5.1)
Sodium: 139 mmol/L (ref 135–145)

## 2019-02-17 LAB — CBC
HCT: 41.5 % (ref 36.0–46.0)
Hemoglobin: 12.9 g/dL (ref 12.0–15.0)
MCH: 30.6 pg (ref 26.0–34.0)
MCHC: 31.1 g/dL (ref 30.0–36.0)
MCV: 98.3 fL (ref 80.0–100.0)
Platelets: 269 10*3/uL (ref 150–400)
RBC: 4.22 MIL/uL (ref 3.87–5.11)
RDW: 14.9 % (ref 11.5–15.5)
WBC: 6.1 10*3/uL (ref 4.0–10.5)
nRBC: 0 % (ref 0.0–0.2)

## 2019-02-17 LAB — HEPATIC FUNCTION PANEL
ALT: 21 U/L (ref 0–44)
AST: 20 U/L (ref 15–41)
Albumin: 3.5 g/dL (ref 3.5–5.0)
Alkaline Phosphatase: 79 U/L (ref 38–126)
Bilirubin, Direct: 0.1 mg/dL (ref 0.0–0.2)
Indirect Bilirubin: 0.5 mg/dL (ref 0.3–0.9)
Total Bilirubin: 0.6 mg/dL (ref 0.3–1.2)
Total Protein: 7.4 g/dL (ref 6.5–8.1)

## 2019-02-18 ENCOUNTER — Non-Acute Institutional Stay (SKILLED_NURSING_FACILITY): Payer: Medicare Other | Admitting: Adult Health

## 2019-02-18 ENCOUNTER — Encounter: Payer: Self-pay | Admitting: Adult Health

## 2019-02-18 DIAGNOSIS — F0150 Vascular dementia without behavioral disturbance: Secondary | ICD-10-CM

## 2019-02-18 DIAGNOSIS — I10 Essential (primary) hypertension: Secondary | ICD-10-CM | POA: Diagnosis not present

## 2019-02-18 NOTE — Progress Notes (Signed)
Location:   [facility] [facility] Room Number: 141 D Place of Service:  SNF (31)   CODE STATUS: Full Code  No Known Allergies  Chief Complaint  Patient presents with  . Acute Visit    Care Plan Meeting    HPI:  We have come together for her routine care plan meeting. Her weight has remained stable. Her BIMS 7/15. Using front wheel walker. Denies any uncontrolled pain; no changes in her appetite; no insomnia; no depressive or anxious thoughts.   Past Medical History:  Diagnosis Date  . Breast cancer ([place])   . Cognitive communication deficit   . Dementia ([place])   . Difficulty in walking(719.7)   . Fall   . Muscle weakness (generalized)   . Urinary tract infection, site not specified   . Vitamin B deficiency     Past Surgical History:  Procedure Laterality Date  . COLON SURGERY    . IR CATHETER TUBE CHANGE  05/07/2017  . IR EXCHANGE BILIARY DRAIN  07/02/2017  . IR EXCHANGE BILIARY DRAIN  07/10/2017  . IR EXCHANGE BILIARY DRAIN  08/28/2017  . IR EXCHANGE BILIARY DRAIN  10/11/2017  . IR EXCHANGE BILIARY DRAIN  10/28/2017  . IR EXCHANGE BILIARY DRAIN  12/23/2017  . IR EXCHANGE BILIARY DRAIN  01/23/2018  . IR EXCHANGE BILIARY DRAIN  02/17/2018  . IR EXCHANGE BILIARY DRAIN  03/20/2018  . IR EXCHANGE BILIARY DRAIN  04/17/2018  . IR EXCHANGE BILIARY DRAIN  05/29/2018  . IR EXCHANGE BILIARY DRAIN  07/24/2018  . IR EXCHANGE BILIARY DRAIN  09/22/2018  . IR EXCHANGE BILIARY DRAIN  11/20/2018  . IR EXCHANGE BILIARY DRAIN  02/11/2019  . IR PERC CHOLECYSTOSTOMY  01/27/2017  . IR RADIOLOGIST EVAL & MGMT  03/05/2017  . MASTECTOMY, PARTIAL Right     Social History   Socioeconomic History  . Marital status: Widowed    Spouse name: Not on file  . Number of children: Not on file  . Years of education: Not on file  . Highest education level: Not on file  Occupational History  . Not on file  Social Needs  . Financial resource strain: Not hard at all  . Food insecurity:   Worry: Never true    Inability: Never true  . Transportation needs:    Medical: No    Non-medical: No  Tobacco Use  . Smoking status: Never Smoker  . Smokeless tobacco: Never Used  Substance and Sexual Activity  . Alcohol use: No  . Drug use: No  . Sexual activity: Not on file  Lifestyle  . Physical activity:    Days per week: 0 days    Minutes per session: 0 min  . Stress: Not at all  Relationships  . Social connections:    Talks on phone: Never    Gets together: Never    Attends religious service: Never    Active member of club or organization: No    Attends meetings of clubs or organizations: Never    Relationship status: Widowed  . Intimate partner violence:    Fear of current or ex partner: No    Emotionally abused: No    Physically abused: No    Forced sexual activity: No  Other Topics Concern  . Not on file  Social History Narrative  . Not on file   Family History  Problem Relation Age of Onset  . Cancer Father        ? primary  .  Diabetes Neg Hx   . Heart disease Neg Hx   . Stroke Neg Hx       VITAL SIGNS Ht 5\' 3"  (1.6 m)   Wt 152 lb 12.8 oz (69.3 kg)   BMI 27.07 kg/m   Outpatient Encounter Medications as of 02/18/2019  Medication Sig  . acetaminophen (TYLENOL) 325 MG tablet Take 650 mg by mouth every 4 (four) hours as needed.  . calcium carbonate (TUMS) 500 MG chewable tablet Chew 1 tablet by mouth daily.  . Cholecalciferol 1000 units tablet Take 1,000 Units by mouth at bedtime.   . Cyanocobalamin (VITAMIN B-12) 1000 MCG SUBL Place 1 tablet under the tongue at bedtime.  . donepezil (ARICEPT) 10 MG tablet Take 1 tablet by mouth at bedtime.  . NON FORMULARY Diet type:  NAS  . Nutritional Supplements (NUTRITIONAL SUPPLEMENT PO) Magic Cup at bedtime for supplement  . polyethylene glycol (MIRALAX / GLYCOLAX) packet Take 17 g by mouth daily as needed.   . Sodium Chloride Flush (NORMAL SALINE FLUSH) 0.9 % SOLN Flush cholecystostomy tube once every other   day with 5 ml and then recap   No facility-administered encounter medications on file as of 02/18/2019.      SIGNIFICANT DIAGNOSTIC EXAMS  LABS REVIEWED: PREVIOUS:   08-15-18: wbc 6.3 hgb 12.7; hct 41.9; mcv 97.2 plt 260; glucose 107; bun 16; creat 0.89; k+ 4.0; na++ 139; ca 8.9 liver normal albumin 3.7  TODAY:   02-17-19: wbc 6.1; hgb 12.9; hct 41.5; mcv 98.3 plt 269 glucose 92; bun 18; creat 0.91; k+ 4.3; na++ 139; ca 8.8 liver normal albumin 3.5    Review of Systems  Constitutional: Negative for malaise/fatigue.  Respiratory: Negative for cough and shortness of breath.   Cardiovascular: Negative for chest pain, palpitations and leg swelling.  Gastrointestinal: Negative for abdominal pain, constipation and heartburn.  Musculoskeletal: Negative for back pain, joint pain and myalgias.  Skin: Negative.   Neurological: Negative for dizziness.  Psychiatric/Behavioral: The patient is not nervous/anxious.      Physical Exam Constitutional:      General: She is not in acute distress.    Appearance: She is well-developed. She is not diaphoretic.  Neck:     Musculoskeletal: Neck supple.     Thyroid: No thyromegaly.  Cardiovascular:     Rate and Rhythm: Normal rate and regular rhythm.     Pulses: Normal pulses.     Heart sounds: Normal heart sounds.  Pulmonary:     Effort: Pulmonary effort is normal. No respiratory distress.     Breath sounds: Normal breath sounds.  Chest:     Comments: History of right partial mastectomy  Abdominal:     General: Bowel sounds are normal. There is no distension.     Palpations: Abdomen is soft.     Tenderness: There is no abdominal tenderness.     Comments: Bili drain right lower quad  History of right hemi colectomy     Musculoskeletal:     Right lower leg: No edema.     Left lower leg: No edema.     Comments: Trace bilateral lower extremity edema Is able to move all extremities  History of C2 fracture  Lymphadenopathy:     Cervical: No  cervical adenopathy.  Skin:    General: Skin is warm and dry.  Neurological:     Mental Status: She is alert. Mental status is at baseline.  Psychiatric:        Mood and Affect: Mood  normal.       ASSESSMENT/ PLAN:  TODAY;   1. Vascular dementia without behavioral disturbance 2. Essential hypertension  Will continue her current medications Will continue her current plan of care Will continue to monitor her status.    MD is aware of resident's narcotic use and is in agreement with current plan of care. We will attempt to wean resident as apropriate   Ok Edwards NP [facility]  Contact [phone] Monday through Friday 8am- 5pm  After hours call [phone]

## 2019-03-18 ENCOUNTER — Non-Acute Institutional Stay (SKILLED_NURSING_FACILITY): Payer: Medicare Other | Admitting: Adult Health

## 2019-03-18 ENCOUNTER — Encounter: Payer: Self-pay | Admitting: Adult Health

## 2019-03-18 DIAGNOSIS — F0150 Vascular dementia without behavioral disturbance: Secondary | ICD-10-CM | POA: Diagnosis not present

## 2019-03-18 DIAGNOSIS — C50911 Malignant neoplasm of unspecified site of right female breast: Secondary | ICD-10-CM

## 2019-03-18 DIAGNOSIS — K8001 Calculus of gallbladder with acute cholecystitis with obstruction: Secondary | ICD-10-CM | POA: Diagnosis not present

## 2019-03-18 NOTE — Progress Notes (Signed)
Location:   [place] Room Number: 141 D Place of Service:  SNF (31)   CODE STATUS: Full Code  No Known Allergies  Chief Complaint  Patient presents with  . Medical Management of Chronic Issues    Malignant neoplasm of right female breast unspecified estrogen receptor status unspecified site of breast; calculus of gallbladder with acute cholecystitis and obstruction; vascular dementia without behavioral disturbance.     HPI:  She is a 85 year old long term resident of this facility being seen for the management of her chronic illnesses: breast cancer; gallbladder disease; dementia. There are no reports of agitation; no uncontrolled pain; no changes in appetite. No reports of fevers present.   Past Medical History:  Diagnosis Date  . Breast cancer ([place])   . Cognitive communication deficit   . Dementia ([place])   . Difficulty in walking(719.7)   . Fall   . Muscle weakness (generalized)   . Urinary tract infection, site not specified   . Vitamin B deficiency     Past Surgical History:  Procedure Laterality Date  . COLON SURGERY    . IR CATHETER TUBE CHANGE  05/07/2017  . IR EXCHANGE BILIARY DRAIN  07/02/2017  . IR EXCHANGE BILIARY DRAIN  07/10/2017  . IR EXCHANGE BILIARY DRAIN  08/28/2017  . IR EXCHANGE BILIARY DRAIN  10/11/2017  . IR EXCHANGE BILIARY DRAIN  10/28/2017  . IR EXCHANGE BILIARY DRAIN  12/23/2017  . IR EXCHANGE BILIARY DRAIN  01/23/2018  . IR EXCHANGE BILIARY DRAIN  02/17/2018  . IR EXCHANGE BILIARY DRAIN  03/20/2018  . IR EXCHANGE BILIARY DRAIN  04/17/2018  . IR EXCHANGE BILIARY DRAIN  05/29/2018  . IR EXCHANGE BILIARY DRAIN  07/24/2018  . IR EXCHANGE BILIARY DRAIN  09/22/2018  . IR EXCHANGE BILIARY DRAIN  11/20/2018  . IR EXCHANGE BILIARY DRAIN  02/11/2019  . IR PERC CHOLECYSTOSTOMY  01/27/2017  . IR RADIOLOGIST EVAL & MGMT  03/05/2017  . MASTECTOMY, PARTIAL Right     Social History   Socioeconomic History  . Marital status: Widowed   Spouse name: Not on file  . Number of children: Not on file  . Years of education: Not on file  . Highest education level: Not on file  Occupational History  . Not on file  Social Needs  . Financial resource strain: Not hard at all  . Food insecurity:    Worry: Never true    Inability: Never true  . Transportation needs:    Medical: No    Non-medical: No  Tobacco Use  . Smoking status: Never Smoker  . Smokeless tobacco: Never Used  Substance and Sexual Activity  . Alcohol use: No  . Drug use: No  . Sexual activity: Not on file  Lifestyle  . Physical activity:    Days per week: 0 days    Minutes per session: 0 min  . Stress: Not at all  Relationships  . Social connections:    Talks on phone: Never    Gets together: Never    Attends religious service: Never    Active member of club or organization: No    Attends meetings of clubs or organizations: Never    Relationship status: Widowed  . Intimate partner violence:    Fear of current or ex partner: No    Emotionally abused: No    Physically abused: No    Forced sexual activity: No  Other Topics Concern  . Not on file  Social History Narrative  . Not on file   Family History  Problem Relation Age of Onset  . Cancer Father        ? primary  . Diabetes Neg Hx   . Heart disease Neg Hx   . Stroke Neg Hx       VITAL SIGNS BP 126/68   Pulse 71   Temp (!) 96.1 F (35.6 C)   Resp 18   Ht 5\' 3"  (1.6 m)   Wt 150 lb (68 kg)   BMI 26.57 kg/m   Outpatient Encounter Medications as of 03/18/2019  Medication Sig  . acetaminophen (TYLENOL) 325 MG tablet Take 650 mg by mouth every 4 (four) hours as needed.  . calcium carbonate (TUMS) 500 MG chewable tablet Chew 1 tablet by mouth daily.  . Cholecalciferol 1000 units tablet Take 1,000 Units by mouth at bedtime.   . Cyanocobalamin (VITAMIN B-12) 1000 MCG SUBL Place 1 tablet under the tongue at bedtime.  . donepezil (ARICEPT) 10 MG tablet Take 1 tablet by mouth at  bedtime.  . NON FORMULARY Diet type:  NAS  . Nutritional Supplements (NUTRITIONAL SUPPLEMENT PO) Magic Cup at bedtime for supplement  . polyethylene glycol (MIRALAX / GLYCOLAX) packet Take 17 g by mouth daily as needed.   . Sodium Chloride Flush (NORMAL SALINE FLUSH) 0.9 % SOLN Flush cholecystostomy tube once every other  day with 5 ml and then recap   No facility-administered encounter medications on file as of 03/18/2019.      SIGNIFICANT DIAGNOSTIC EXAMS  LABS REVIEWED: PREVIOUS:   08-15-18: wbc 6.3 hgb 12.7; hct 41.9; mcv 97.2 plt 260; glucose 107; bun 16; creat 0.89; k+ 4.0; na++ 139; ca 8.9 liver normal albumin 3.7 02-17-19: wbc 6.1; hgb 12.9; hct 41.5; mcv 98.3 plt 269 glucose 92; bun 18; creat 0.91; k+ 4.3; na++ 139; ca 8.8 liver normal albumin 3.5   NO NEW LABS.    Review of Systems  Constitutional: Negative for malaise/fatigue.  Respiratory: Negative for cough and shortness of breath.   Cardiovascular: Negative for chest pain, palpitations and leg swelling.  Gastrointestinal: Negative for abdominal pain, constipation and heartburn.  Musculoskeletal: Negative for back pain, joint pain and myalgias.  Skin: Negative.   Neurological: Negative for dizziness.  Psychiatric/Behavioral: The patient is not nervous/anxious.      Physical Exam Constitutional:      General: She is not in acute distress.    Appearance: She is well-developed. She is not diaphoretic.  Neck:     Musculoskeletal: Neck supple.     Thyroid: No thyromegaly.  Cardiovascular:     Rate and Rhythm: Normal rate and regular rhythm.     Pulses: Normal pulses.     Heart sounds: Normal heart sounds.  Pulmonary:     Effort: Pulmonary effort is normal. No respiratory distress.     Breath sounds: Normal breath sounds.  Chest:     Comments: History of right partial mastectomy  Abdominal:     General: Bowel sounds are normal. There is no distension.     Palpations: Abdomen is soft.     Tenderness: There is no  abdominal tenderness.     Comments:  Bili drain right lower quad  History of right hemi colectomy      Musculoskeletal:     Right lower leg: Edema present.     Left lower leg: Edema present.     Comments: Trace bilateral lower extremity edema Is able to move all extremities  History of C2 fracture   Lymphadenopathy:     Cervical: No cervical adenopathy.  Skin:    General: Skin is warm and dry.  Neurological:     Mental Status: She is alert. Mental status is at baseline.  Psychiatric:        Mood and Affect: Mood normal.      ASSESSMENT/ PLAN:  TODAY;   1. Malignant neoplasm of right female breast unspecified estrogen receptor status unspecified site of breast. Is status post right partial mastectomy is stable will monitor   2. Calculus of gallbladder with acuate cholecystitis and obstruction: s/p cholecystostomy (multiple replacements) is stable will monitor   3. Dementia without behavioral disturbance unspecified dementia type: is without change: weight is 150 (previous 153) pounds. Will continue aricept 10 mg daily   PREVIOUS   4. Essential hypertension is stable b/p 126/68: will monitor   5. Chronic constipation: is stable will continue miralax daily as needed  MD is aware of resident's narcotic use and is in agreement with current plan of care. We will attempt to wean resident as apropriate   Ok Edwards NP [facility]  Contact [phone] Monday through Friday 8am- 5pm  After hours call [phone]

## 2019-04-02 DIAGNOSIS — Z9181 History of falling: Secondary | ICD-10-CM | POA: Diagnosis not present

## 2019-04-02 DIAGNOSIS — Z978 Presence of other specified devices: Secondary | ICD-10-CM | POA: Diagnosis not present

## 2019-04-02 DIAGNOSIS — E538 Deficiency of other specified B group vitamins: Secondary | ICD-10-CM | POA: Diagnosis not present

## 2019-04-02 DIAGNOSIS — E039 Hypothyroidism, unspecified: Secondary | ICD-10-CM | POA: Diagnosis not present

## 2019-04-14 ENCOUNTER — Ambulatory Visit (HOSPITAL_COMMUNITY)
Admission: RE | Admit: 2019-04-14 | Discharge: 2019-04-14 | Disposition: A | Discharge status: Home / Self Care | Payer: Medicare Other | Source: Ambulatory Visit | Attending: Internal Medicine | Admitting: Internal Medicine

## 2019-04-14 ENCOUNTER — Encounter (HOSPITAL_COMMUNITY): Payer: Self-pay | Admitting: Interventional Radiology

## 2019-04-14 ENCOUNTER — Non-Acute Institutional Stay (SKILLED_NURSING_FACILITY): Payer: Medicare Other | Admitting: Adult Health

## 2019-04-14 DIAGNOSIS — K5909 Other constipation: Secondary | ICD-10-CM

## 2019-04-14 DIAGNOSIS — I10 Essential (primary) hypertension: Secondary | ICD-10-CM | POA: Diagnosis not present

## 2019-04-14 DIAGNOSIS — C50911 Malignant neoplasm of unspecified site of right female breast: Secondary | ICD-10-CM

## 2019-04-14 DIAGNOSIS — Z434 Encounter for attention to other artificial openings of digestive tract: Secondary | ICD-10-CM | POA: Insufficient documentation

## 2019-04-14 DIAGNOSIS — K8020 Calculus of gallbladder without cholecystitis without obstruction: Secondary | ICD-10-CM | POA: Insufficient documentation

## 2019-04-14 HISTORY — PX: IR EXCHANGE BILIARY DRAIN: IMG6046

## 2019-04-14 MED ORDER — LIDOCAINE HCL 1 % IJ SOLN
INTRAMUSCULAR | Status: DC | PRN
  Administered 2019-04-14: 5 mL

## 2019-04-14 MED ORDER — LIDOCAINE HCL 1 % IJ SOLN
INTRAMUSCULAR | Status: AC
  Filled 2019-04-14: qty 20

## 2019-04-14 MED ORDER — IOHEXOL 300 MG/ML  SOLN
50.0000 mL | Freq: Once | INTRAMUSCULAR | Status: AC | PRN
  Administered 2019-04-14: 10 mL

## 2019-04-14 NOTE — Progress Notes (Signed)
Location:   [place] Room Number: 141 D Place of Service:  SNF (31)   CODE STATUS: Full Code  No Known Allergies  Chief Complaint  Patient presents with  . Medical Management of Chronic Issues      Essential hypertension:Chronic constipation:. Malignant neoplasm of right female breast unspecified estrogen receptor status unspecified site of breast:    HPI:  She is a 85 year old long term resident of this facility being seen for the management of her chronic illnesses; hypertension; constipation; breast cancer. She denies any uncontrolled pain; no changes in her appetite; no anxiety or depressive thoughts. There are no reports of fevers present.   Past Medical History:  Diagnosis Date  . Breast cancer ([place])   . Cognitive communication deficit   . Dementia ([place])   . Difficulty in walking(719.7)   . Fall   . Muscle weakness (generalized)   . Urinary tract infection, site not specified   . Vitamin B deficiency     Past Surgical History:  Procedure Laterality Date  . COLON SURGERY    . IR CATHETER TUBE CHANGE  05/07/2017  . IR EXCHANGE BILIARY DRAIN  07/02/2017  . IR EXCHANGE BILIARY DRAIN  07/10/2017  . IR EXCHANGE BILIARY DRAIN  08/28/2017  . IR EXCHANGE BILIARY DRAIN  10/11/2017  . IR EXCHANGE BILIARY DRAIN  10/28/2017  . IR EXCHANGE BILIARY DRAIN  12/23/2017  . IR EXCHANGE BILIARY DRAIN  01/23/2018  . IR EXCHANGE BILIARY DRAIN  02/17/2018  . IR EXCHANGE BILIARY DRAIN  03/20/2018  . IR EXCHANGE BILIARY DRAIN  04/17/2018  . IR EXCHANGE BILIARY DRAIN  05/29/2018  . IR EXCHANGE BILIARY DRAIN  07/24/2018  . IR EXCHANGE BILIARY DRAIN  09/22/2018  . IR EXCHANGE BILIARY DRAIN  11/20/2018  . IR EXCHANGE BILIARY DRAIN  02/11/2019  . IR EXCHANGE BILIARY DRAIN  04/14/2019  . IR PERC CHOLECYSTOSTOMY  01/27/2017  . IR RADIOLOGIST EVAL & MGMT  03/05/2017  . MASTECTOMY, PARTIAL Right     Social History   Socioeconomic History  . Marital status: Widowed    Spouse  name: Not on file  . Number of children: Not on file  . Years of education: Not on file  . Highest education level: Not on file  Occupational History  . Not on file  Social Needs  . Financial resource strain: Not hard at all  . Food insecurity    Worry: Never true    Inability: Never true  . Transportation needs    Medical: No    Non-medical: No  Tobacco Use  . Smoking status: Never Smoker  . Smokeless tobacco: Never Used  Substance and Sexual Activity  . Alcohol use: No  . Drug use: No  . Sexual activity: Not on file  Lifestyle  . Physical activity    Days per week: 0 days    Minutes per session: 0 min  . Stress: Not at all  Relationships  . Social Herbalist on phone: Never    Gets together: Never    Attends religious service: Never    Active member of club or organization: No    Attends meetings of clubs or organizations: Never    Relationship status: Widowed  . Intimate partner violence    Fear of current or ex partner: No    Emotionally abused: No    Physically abused: No    Forced sexual activity: No  Other Topics Concern  .  Not on file  Social History Narrative  . Not on file   Family History  Problem Relation Age of Onset  . Cancer Father        ? primary  . Diabetes Neg Hx   . Heart disease Neg Hx   . Stroke Neg Hx       VITAL SIGNS BP (!) 138/55   Pulse 78   Temp 97.6 F (36.4 C)   Resp 16   Ht 5\' 3"  (1.6 m)   Wt 147 lb 12.8 oz (67 kg)   BMI 26.18 kg/m   Facility-Administered Encounter Medications as of 04/14/2019  Medication  . lidocaine (XYLOCAINE) 1 % (with pres) injection  . lidocaine (XYLOCAINE) 1 % (with pres) injection   Outpatient Encounter Medications as of 04/14/2019  Medication Sig  . acetaminophen (TYLENOL) 325 MG tablet Take 650 mg by mouth every 4 (four) hours as needed.  . calcium carbonate (TUMS) 500 MG chewable tablet Chew 1 tablet by mouth daily.  . Cholecalciferol 1000 units tablet Take 1,000 Units by  mouth at bedtime.   . Cyanocobalamin (VITAMIN B-12) 1000 MCG SUBL Place 1 tablet under the tongue at bedtime.  . donepezil (ARICEPT) 10 MG tablet Take 1 tablet by mouth at bedtime.  . NON FORMULARY Diet type:  NAS  . Nutritional Supplements (NUTRITIONAL SUPPLEMENT PO) Magic Cup at bedtime for supplement  . polyethylene glycol (MIRALAX / GLYCOLAX) packet Take 17 g by mouth daily as needed.   . Sodium Chloride Flush (NORMAL SALINE FLUSH) 0.9 % SOLN Flush cholecystostomy tube once every other  day with 5 ml and then recap     SIGNIFICANT DIAGNOSTIC EXAMS  LABS REVIEWED: PREVIOUS:   08-15-18: wbc 6.3 hgb 12.7; hct 41.9; mcv 97.2 plt 260; glucose 107; bun 16; creat 0.89; k+ 4.0; na++ 139; ca 8.9 liver normal albumin 3.7 02-17-19: wbc 6.1; hgb 12.9; hct 41.5; mcv 98.3 plt 269 glucose 92; bun 18; creat 0.91; k+ 4.3; na++ 139; ca 8.8 liver normal albumin 3.5   NO NEW LABS.   Review of Systems  Constitutional: Negative for malaise/fatigue.  Respiratory: Negative for cough and shortness of breath.   Cardiovascular: Negative for chest pain, palpitations and leg swelling.  Gastrointestinal: Negative for abdominal pain, constipation and heartburn.  Musculoskeletal: Negative for back pain, joint pain and myalgias.  Skin: Negative.   Neurological: Negative for dizziness.  Psychiatric/Behavioral: The patient is not nervous/anxious.      Physical Exam Constitutional:      General: She is not in acute distress.    Appearance: She is well-developed. She is not diaphoretic.  Neck:     Musculoskeletal: Neck supple.     Thyroid: No thyromegaly.  Cardiovascular:     Rate and Rhythm: Normal rate and regular rhythm.     Pulses: Normal pulses.     Heart sounds: Normal heart sounds.  Pulmonary:     Effort: Pulmonary effort is normal. No respiratory distress.     Breath sounds: Normal breath sounds.  Chest:     Comments: History of right partial mastectomy  Abdominal:     General: Bowel sounds are  normal. There is no distension.     Palpations: Abdomen is soft.     Tenderness: There is no abdominal tenderness.     Comments:  Bili drain right lower quad  History of right hemi colectomy       Musculoskeletal:     Right lower leg: Edema present.  Left lower leg: Edema present.     Comments: Trace bilateral lower extremity edema Is able to move all extremities  History of C2 fracture    Lymphadenopathy:     Cervical: No cervical adenopathy.  Skin:    General: Skin is warm and dry.  Neurological:     Mental Status: She is alert. Mental status is at baseline.  Psychiatric:        Mood and Affect: Mood normal.      ASSESSMENT/ PLAN:  TODAY;   1. Essential hypertension: is stable b/p 138/55 will monitor  2. Chronic constipation: is stable will continue miralax daily as needed  3. Malignant neoplasm of right female breast unspecified estrogen receptor status unspecified site of breast: history of right partial mastectomy will monitor   PREVIOUS   4. Calculus of gallbladder with acuate cholecystitis and obstruction: s/p cholecystostomy (multiple replacements) is stable will monitor   5. Dementia without behavioral disturbance unspecified dementia type: is without change: weight is 147  (previous 150,  153) pounds. Will continue aricept 10 mg daily     MD is aware of resident's narcotic use and is in agreement with current plan of care. We will attempt to wean resident as apropriate   Ok Edwards NP [facility]  Contact [phone] Monday through Friday 8am- 5pm  After hours call [phone]

## 2019-04-14 NOTE — Procedures (Signed)
Pre procedural Dx: Chronic cholecysitis Post procedural Dx: Same  Technically successful Fluoro guided exchange of 10 Fr cholecystomy tube.  EBL: None Complications: None immediate  Ronny Bacon, MD Pager #: [phone]

## 2019-05-13 ENCOUNTER — Non-Acute Institutional Stay (SKILLED_NURSING_FACILITY): Payer: Medicare Other | Admitting: Internal Medicine

## 2019-05-13 ENCOUNTER — Encounter: Payer: Self-pay | Admitting: Internal Medicine

## 2019-05-13 DIAGNOSIS — I10 Essential (primary) hypertension: Secondary | ICD-10-CM | POA: Diagnosis not present

## 2019-05-13 DIAGNOSIS — F0150 Vascular dementia without behavioral disturbance: Secondary | ICD-10-CM | POA: Diagnosis not present

## 2019-05-13 DIAGNOSIS — K5909 Other constipation: Secondary | ICD-10-CM | POA: Diagnosis not present

## 2019-05-13 NOTE — Progress Notes (Signed)
Location:  [facility] Room Number: 141/D Place of Service:  SNF (31)  Hennie Duos, MD  Patient Care Team: Hennie Duos, MD as PCP - General (Internal Medicine) Nyoka Cowden Phylis Bougie, NP as Nurse Practitioner ([place]) Center, [place] ([place])  Extended Emergency Contact Information Primary Emergency Contact: [place], [place] [address] Johnnette Litter of [place] Phone: [phone] Relation: Friend Secondary Emergency Contact: [place] of [place] Phone: [phone] Relation: Daughter    Allergies: Patient has no known allergies.  Chief Complaint  Patient presents with  . Medical Management of Chronic Issues    Routine visit of medical managenent    HPI: Patient is 85 y.o. female who is being seen for routine issues of hypertension, chronic constipation, and dementia.  Past Medical History:  Diagnosis Date  . Breast cancer ([place])   . Cognitive communication deficit   . Dementia ([place])   . Difficulty in walking(719.7)   . Fall   . Muscle weakness (generalized)   . Urinary tract infection, site not specified   . Vitamin B deficiency     Past Surgical History:  Procedure Laterality Date  . COLON SURGERY    . IR CATHETER TUBE CHANGE  05/07/2017  . IR EXCHANGE BILIARY DRAIN  07/02/2017  . IR EXCHANGE BILIARY DRAIN  07/10/2017  . IR EXCHANGE BILIARY DRAIN  08/28/2017  . IR EXCHANGE BILIARY DRAIN  10/11/2017  . IR EXCHANGE BILIARY DRAIN  10/28/2017  . IR EXCHANGE BILIARY DRAIN  12/23/2017  . IR EXCHANGE BILIARY DRAIN  01/23/2018  . IR EXCHANGE BILIARY DRAIN  02/17/2018  . IR EXCHANGE BILIARY DRAIN  03/20/2018  . IR EXCHANGE BILIARY DRAIN  04/17/2018  . IR EXCHANGE BILIARY DRAIN  05/29/2018  . IR EXCHANGE BILIARY DRAIN  07/24/2018  . IR EXCHANGE BILIARY DRAIN  09/22/2018  . IR EXCHANGE BILIARY DRAIN  11/20/2018  . IR EXCHANGE BILIARY DRAIN  02/11/2019  . IR EXCHANGE  BILIARY DRAIN  04/14/2019  . IR PERC CHOLECYSTOSTOMY  01/27/2017  . IR RADIOLOGIST EVAL & MGMT  03/05/2017  . MASTECTOMY, PARTIAL Right     Outpatient Encounter Medications as of 05/13/2019  Medication Sig  . acetaminophen (TYLENOL) 325 MG tablet Take 650 mg by mouth every 4 (four) hours as needed.  . calcium carbonate (TUMS) 500 MG chewable tablet Chew 1 tablet by mouth daily.  . Cholecalciferol 1000 units tablet Take 1,000 Units by mouth at bedtime.   . Cyanocobalamin (VITAMIN B-12) 1000 MCG SUBL Place 1 tablet under the tongue at bedtime.  . donepezil (ARICEPT) 10 MG tablet Take 1 tablet by mouth at bedtime.  . NON FORMULARY Diet type:  NAS  . Nutritional Supplements (NUTRITIONAL SUPPLEMENT PO) Magic Cup at bedtime for supplement  . polyethylene glycol (MIRALAX / GLYCOLAX) packet Take 17 g by mouth daily as needed.   . Sodium Chloride Flush (NORMAL SALINE FLUSH) 0.9 % SOLN Flush cholecystostomy tube once every other  day with 5 ml and then recap   No facility-administered encounter medications on file as of 05/13/2019.      No orders of the defined types were placed in this encounter.   Immunization History  Administered Date(s) Administered  . Influenza-Unspecified 07/28/2014, 07/20/2016, 07/26/2017, 07/24/2018  . Pneumococcal Conjugate-13 06/21/2017  . Pneumococcal-Unspecified 08/01/2016  . Tdap 07/11/2017    Social History   Tobacco Use  . Smoking status: Never Smoker  .  Smokeless tobacco: Never Used  Substance Use Topics  . Alcohol use: No    Review of Systems  DATA OBTAINED: from patient, nurse GENERAL:  no fevers, fatigue, appetite changes SKIN: No itching, rash HEENT: No complaint RESPIRATORY: No cough, wheezing, SOB CARDIAC: No chest pain, palpitations, lower extremity edema  GI: No abdominal pain, No N/V/D or constipation, No heartburn or reflux  GU: No dysuria, frequency or urgency, or incontinence  MUSCULOSKELETAL: No unrelieved bone/joint pain NEUROLOGIC:  No headache, dizziness  PSYCHIATRIC: No overt anxiety or sadness  Vitals:   05/13/19 1118  BP: 137/61  Pulse: 74  Resp: 20  Temp: 97.6 F (36.4 C)  SpO2: 96%   Body mass index is 26.22 kg/m. Physical Exam  GENERAL APPEARANCE: Alert, conversant, No acute distress  SKIN: No diaphoresis rash HEENT: Unremarkable RESPIRATORY: Breathing is even, unlabored. Lung sounds are clear   CARDIOVASCULAR: Heart RRR no murmurs, rubs or gallops. No peripheral edema  GASTROINTESTINAL: Abdomen is soft, non-tender, not distended w/ normal bowel sounds.  GENITOURINARY: Bladder non tender, not distended  MUSCULOSKELETAL: No abnormal joints or musculature NEUROLOGIC: Cranial nerves 2-12 grossly intact. Moves all extremities PSYCHIATRIC: Mood and affect appropriate to situation with dementia, no behavioral issues  Patient Active Problem List   Diagnosis Date Noted  . Chronic constipation 12/20/2018  . S/P Cholecystostomy 01/31/2017  . Cholecystitis, acute with cholelithiasis 01/26/2017  . HTN (hypertension) 08/26/2016  . Breast cancer ([place]) 12/11/2013  . Benign neoplasm of colon 12/11/2013  . Dementia ([place]) 12/11/2013  . C2 cervical fracture ([facility]) 12/08/2013    CMP     Component Value Date/Time   NA 139 02/17/2019 0700   NA 141 10/27/2015   K 4.3 02/17/2019 0700   CL 106 02/17/2019 0700   CO2 26 02/17/2019 0700   GLUCOSE 92 02/17/2019 0700   BUN 18 02/17/2019 0700   BUN 13 10/27/2015   CREATININE 0.91 02/17/2019 0700   CALCIUM 8.8 (L) 02/17/2019 0700   PROT 7.4 02/17/2019 0700   ALBUMIN 3.5 02/17/2019 0700   AST 20 02/17/2019 0700   ALT 21 02/17/2019 0700   ALKPHOS 79 02/17/2019 0700   BILITOT 0.6 02/17/2019 0700   GFRNONAA 55 (L) 02/17/2019 0700   GFRAA >60 02/17/2019 0700   Recent Labs    08/15/18 0712 02/17/19 0700  NA 139 139  K 4.0 4.3  CL 104 106  CO2 27 26  GLUCOSE 107* 92  BUN 16 18  CREATININE 0.89 0.91  CALCIUM 8.9 8.8*   Recent Labs    08/15/18 0712  02/17/19 0700  AST 20 20  ALT 15 21  ALKPHOS 73 79  BILITOT 0.5 0.6  PROT 7.3 7.4  ALBUMIN 3.7 3.5   Recent Labs    08/15/18 0712 02/17/19 0700  WBC 6.3 6.1  NEUTROABS 3.3  --   HGB 12.7 12.9  HCT 41.9 41.5  MCV 97.2 98.3  PLT 260 269   No results for input(s): CHOL, LDLCALC, TRIG in the last 8760 hours.  Invalid input(s): HCL No results found for: [facility] Lab Results  Component Value Date   TSH 3.877 01/02/2018   No results found for: HGBA1C No results found for: CHOL, HDL, LDLCALC, LDLDIRECT, TRIG, CHOLHDL  Significant Diagnostic Results in last 30 days:  No results found.  Assessment and Plan  HTN (hypertension) Controlled on no medication; continue to monitor  Chronic constipation No reported problems; continue MiraLAX 17 g daily as needed  Dementia Chronic and stable; continue Aricept 10  mg daily     Hennie Duos, MD

## 2019-05-16 ENCOUNTER — Encounter: Payer: Self-pay | Admitting: Internal Medicine

## 2019-05-16 NOTE — Assessment & Plan Note (Signed)
No reported problems; continue MiraLAX 17 g daily as needed

## 2019-05-16 NOTE — Assessment & Plan Note (Signed)
Controlled on no medication; continue to monitor

## 2019-05-16 NOTE — Assessment & Plan Note (Signed)
Chronic and stable; continue Aricept 10 mg daily

## 2019-06-08 ENCOUNTER — Other Ambulatory Visit: Payer: Self-pay | Admitting: Radiology

## 2019-06-09 ENCOUNTER — Ambulatory Visit (HOSPITAL_COMMUNITY)
Admission: RE | Admit: 2019-06-09 | Discharge: 2019-06-09 | Disposition: A | Discharge status: Home / Self Care | Payer: Medicare Other | Source: Ambulatory Visit | Attending: Internal Medicine | Admitting: Internal Medicine

## 2019-06-09 ENCOUNTER — Non-Acute Institutional Stay (SKILLED_NURSING_FACILITY): Payer: Medicare Other | Admitting: Adult Health

## 2019-06-09 ENCOUNTER — Encounter: Payer: Self-pay | Admitting: Adult Health

## 2019-06-09 ENCOUNTER — Other Ambulatory Visit (HOSPITAL_COMMUNITY): Payer: Medicare Other

## 2019-06-09 DIAGNOSIS — I10 Essential (primary) hypertension: Secondary | ICD-10-CM | POA: Diagnosis not present

## 2019-06-09 DIAGNOSIS — K8020 Calculus of gallbladder without cholecystitis without obstruction: Secondary | ICD-10-CM | POA: Insufficient documentation

## 2019-06-09 DIAGNOSIS — Z4803 Encounter for change or removal of drains: Secondary | ICD-10-CM | POA: Diagnosis not present

## 2019-06-09 DIAGNOSIS — K8001 Calculus of gallbladder with acute cholecystitis with obstruction: Secondary | ICD-10-CM | POA: Diagnosis not present

## 2019-06-09 DIAGNOSIS — F0150 Vascular dementia without behavioral disturbance: Secondary | ICD-10-CM

## 2019-06-09 DIAGNOSIS — Z434 Encounter for attention to other artificial openings of digestive tract: Secondary | ICD-10-CM | POA: Diagnosis not present

## 2019-06-09 DIAGNOSIS — K811 Chronic cholecystitis: Secondary | ICD-10-CM | POA: Diagnosis not present

## 2019-06-09 HISTORY — PX: IR EXCHANGE BILIARY DRAIN: IMG6046

## 2019-06-09 MED ORDER — LIDOCAINE HCL 1 % IJ SOLN
INTRAMUSCULAR | Status: DC | PRN
  Administered 2019-06-09: 5 mL

## 2019-06-09 MED ORDER — IOHEXOL 300 MG/ML  SOLN
50.0000 mL | Freq: Once | INTRAMUSCULAR | Status: AC | PRN
  Administered 2019-06-09: 15:00:00 10 mL

## 2019-06-09 MED ORDER — LIDOCAINE HCL 1 % IJ SOLN
INTRAMUSCULAR | Status: AC
  Filled 2019-06-09: qty 20

## 2019-06-09 NOTE — Procedures (Signed)
Interventional Radiology Procedure Note  Procedure: Routine exchange of perc chole drain.  New 52F drain.  Complications: None Recommendations:  - To gravity - Do not submerge - Routine care   Signed,  Dulcy Fanny. Earleen Newport, DO

## 2019-06-09 NOTE — Progress Notes (Signed)
Location:   [facility] Room Number: 141 D Place of Service:  SNF (31)   CODE STATUS: Full Code  No Known Allergies  Chief Complaint  Patient presents with  . Medical Management of Chronic Issues        Calculus of gallbladder with acute cholecystitis and obstruction;  Dementia without behavioral disturbance unspecified dementia type:  Essential hypertension:      HPI:  She is a 85 year old long term resident of this facility being seen for the management of her chronic illnesses: gallbladder disease; dementia; hypertension. She denies any uncontrolled pain; no nausea no vomiting; no constipation.   Past Medical History:  Diagnosis Date  . Breast cancer ([place])   . Cognitive communication deficit   . Dementia ([place])   . Difficulty in walking(719.7)   . Fall   . Muscle weakness (generalized)   . Urinary tract infection, site not specified   . Vitamin B deficiency     Past Surgical History:  Procedure Laterality Date  . COLON SURGERY    . IR CATHETER TUBE CHANGE  05/07/2017  . IR EXCHANGE BILIARY DRAIN  07/02/2017  . IR EXCHANGE BILIARY DRAIN  07/10/2017  . IR EXCHANGE BILIARY DRAIN  08/28/2017  . IR EXCHANGE BILIARY DRAIN  10/11/2017  . IR EXCHANGE BILIARY DRAIN  10/28/2017  . IR EXCHANGE BILIARY DRAIN  12/23/2017  . IR EXCHANGE BILIARY DRAIN  01/23/2018  . IR EXCHANGE BILIARY DRAIN  02/17/2018  . IR EXCHANGE BILIARY DRAIN  03/20/2018  . IR EXCHANGE BILIARY DRAIN  04/17/2018  . IR EXCHANGE BILIARY DRAIN  05/29/2018  . IR EXCHANGE BILIARY DRAIN  07/24/2018  . IR EXCHANGE BILIARY DRAIN  09/22/2018  . IR EXCHANGE BILIARY DRAIN  11/20/2018  . IR EXCHANGE BILIARY DRAIN  02/11/2019  . IR EXCHANGE BILIARY DRAIN  04/14/2019  . IR PERC CHOLECYSTOSTOMY  01/27/2017  . IR RADIOLOGIST EVAL & MGMT  03/05/2017  . MASTECTOMY, PARTIAL Right     Social History   Socioeconomic History  . Marital status: Widowed    Spouse name: Not on file  . Number of children: Not on file   . Years of education: Not on file  . Highest education level: Not on file  Occupational History  . Not on file  Social Needs  . Financial resource strain: Not hard at all  . Food insecurity    Worry: Never true    Inability: Never true  . Transportation needs    Medical: No    Non-medical: No  Tobacco Use  . Smoking status: Never Smoker  . Smokeless tobacco: Never Used  Substance and Sexual Activity  . Alcohol use: No  . Drug use: No  . Sexual activity: Not on file  Lifestyle  . Physical activity    Days per week: 0 days    Minutes per session: 0 min  . Stress: Not at all  Relationships  . Social Herbalist on phone: Never    Gets together: Never    Attends religious service: Never    Active member of club or organization: No    Attends meetings of clubs or organizations: Never    Relationship status: Widowed  . Intimate partner violence    Fear of current or ex partner: No    Emotionally abused: No    Physically abused: No    Forced sexual activity: No  Other Topics Concern  . Not on file  Social History Narrative  . Not on file   Family History  Problem Relation Age of Onset  . Cancer Father        ? primary  . Diabetes Neg Hx   . Heart disease Neg Hx   . Stroke Neg Hx       VITAL SIGNS BP (!) 129/55   Pulse 65   Temp 97.8 F (36.6 C)   Resp 16   Ht 5\' 3"  (1.6 m)   Wt 148 lb (67.1 kg)   BMI 26.22 kg/m   Outpatient Encounter Medications as of 06/09/2019  Medication Sig  . acetaminophen (TYLENOL) 325 MG tablet Take 650 mg by mouth every 4 (four) hours as needed.  . calcium carbonate (TUMS) 500 MG chewable tablet Chew 1 tablet by mouth daily.  . Cholecalciferol 1000 units tablet Take 1,000 Units by mouth at bedtime.   . Cyanocobalamin (VITAMIN B-12) 1000 MCG SUBL Place 1 tablet under the tongue at bedtime.  . donepezil (ARICEPT) 10 MG tablet Take 1 tablet by mouth at bedtime.  . NON FORMULARY Diet type:  NAS  . Nutritional Supplements  (NUTRITIONAL SUPPLEMENT PO) Magic Cup at bedtime for supplement  . polyethylene glycol (MIRALAX / GLYCOLAX) packet Take 17 g by mouth daily as needed.   . Sodium Chloride Flush (NORMAL SALINE FLUSH) 0.9 % SOLN Flush cholecystostomy tube once every other  day with 5 ml and then recap   No facility-administered encounter medications on file as of 06/09/2019.      SIGNIFICANT DIAGNOSTIC EXAMS  LABS REVIEWED: PREVIOUS:   08-15-18: wbc 6.3 hgb 12.7; hct 41.9; mcv 97.2 plt 260; glucose 107; bun 16; creat 0.89; k+ 4.0; na++ 139; ca 8.9 liver normal albumin 3.7 02-17-19: wbc 6.1; hgb 12.9; hct 41.5; mcv 98.3 plt 269 glucose 92; bun 18; creat 0.91; k+ 4.3; na++ 139; ca 8.8 liver normal albumin 3.5   NO NEW LABS.   Review of Systems  Constitutional: Negative for malaise/fatigue.  Respiratory: Negative for cough and shortness of breath.   Cardiovascular: Negative for chest pain, palpitations and leg swelling.  Gastrointestinal: Negative for abdominal pain, constipation and heartburn.  Musculoskeletal: Negative for back pain, joint pain and myalgias.  Skin: Negative.   Neurological: Negative for dizziness.  Psychiatric/Behavioral: The patient is not nervous/anxious.     Physical Exam Constitutional:      General: She is not in acute distress.    Appearance: She is well-developed. She is not diaphoretic.  Neck:     Musculoskeletal: Neck supple.     Thyroid: No thyromegaly.  Cardiovascular:     Rate and Rhythm: Normal rate and regular rhythm.     Pulses: Normal pulses.     Heart sounds: Normal heart sounds.  Pulmonary:     Effort: Pulmonary effort is normal. No respiratory distress.     Breath sounds: Normal breath sounds.     Comments: Bili drain right lower quad  History of right hemi colectomy       Chest:     Comments: History of right partial mastectomy  Abdominal:     General: Bowel sounds are normal. There is no distension.     Palpations: Abdomen is soft.     Tenderness:  There is no abdominal tenderness.  Musculoskeletal:     Right lower leg: No edema.     Left lower leg: No edema.     Comments: Trace bilateral lower extremity edema Is able to move all extremities  History of  C2 fracture     Lymphadenopathy:     Cervical: No cervical adenopathy.  Skin:    General: Skin is warm and dry.  Neurological:     Mental Status: She is alert. Mental status is at baseline.  Psychiatric:        Mood and Affect: Mood normal.      ASSESSMENT/ PLAN:  TODAY;   1. Calculus of gallbladder with acute cholecystitis and obstruction; s/p cholecystectomy (multiple replacements) is stable will monitor   2. Dementia without behavioral disturbance unspecified dementia type: is without change weight is 148 (previous 147,150,153) pounds will continue aricept 10 mg daily   3. Essential hypertension: is stable b/p 129/55 will monitor    PREVIOUS   4. Chronic constipation: is stable will continue miralax daily as needed  5. Malignant neoplasm of right female breast unspecified estrogen receptor status unspecified site of breast: history of right partial mastectomy will monitor     MD is aware of resident's narcotic use and is in agreement with current plan of care. We will attempt to wean resident as apropriate   Ok Edwards NP [facility]  Contact [phone] Monday through Friday 8am- 5pm  After hours call [phone]

## 2019-06-24 ENCOUNTER — Encounter (HOSPITAL_COMMUNITY): Payer: Self-pay | Admitting: Emergency Medicine

## 2019-06-24 ENCOUNTER — Other Ambulatory Visit: Payer: Self-pay

## 2019-06-24 ENCOUNTER — Inpatient Hospital Stay
Admission: RE | Admit: 2019-06-24 | Discharge: 2019-11-16 | Disposition: A | Discharge status: Other Acute Inpatient Hospital | Payer: Medicare Other | Source: Ambulatory Visit | Attending: Internal Medicine | Admitting: Internal Medicine

## 2019-06-24 ENCOUNTER — Emergency Department (HOSPITAL_COMMUNITY)
Admission: EM | Admit: 2019-06-24 | Discharge: 2019-06-24 | Disposition: A | Discharge status: Home / Self Care | Payer: Medicare Other | Attending: Emergency Medicine | Admitting: Emergency Medicine

## 2019-06-24 DIAGNOSIS — Z79899 Other long term (current) drug therapy: Secondary | ICD-10-CM | POA: Diagnosis not present

## 2019-06-24 DIAGNOSIS — Z853 Personal history of malignant neoplasm of breast: Secondary | ICD-10-CM | POA: Diagnosis not present

## 2019-06-24 DIAGNOSIS — T85528A Displacement of other gastrointestinal prosthetic devices, implants and grafts, initial encounter: Secondary | ICD-10-CM | POA: Diagnosis not present

## 2019-06-24 DIAGNOSIS — Y69 Unspecified misadventure during surgical and medical care: Secondary | ICD-10-CM | POA: Insufficient documentation

## 2019-06-24 DIAGNOSIS — U071 COVID-19: Secondary | ICD-10-CM

## 2019-06-24 DIAGNOSIS — T85520A Displacement of bile duct prosthesis, initial encounter: Secondary | ICD-10-CM

## 2019-06-24 DIAGNOSIS — F0390 Unspecified dementia without behavioral disturbance: Secondary | ICD-10-CM | POA: Insufficient documentation

## 2019-06-24 DIAGNOSIS — K812 Acute cholecystitis with chronic cholecystitis: Secondary | ICD-10-CM

## 2019-06-24 DIAGNOSIS — Z978 Presence of other specified devices: Principal | ICD-10-CM

## 2019-06-24 DIAGNOSIS — R1031 Right lower quadrant pain: Secondary | ICD-10-CM

## 2019-06-24 LAB — COMPREHENSIVE METABOLIC PANEL
ALT: 13 U/L (ref 0–44)
AST: 16 U/L (ref 15–41)
Albumin: 3.5 g/dL (ref 3.5–5.0)
Alkaline Phosphatase: 66 U/L (ref 38–126)
Anion gap: 4 — ABNORMAL LOW (ref 5–15)
BUN: 17 mg/dL (ref 8–23)
CO2: 27 mmol/L (ref 22–32)
Calcium: 8.6 mg/dL — ABNORMAL LOW (ref 8.9–10.3)
Chloride: 105 mmol/L (ref 98–111)
Creatinine, Ser: 0.87 mg/dL (ref 0.44–1.00)
GFR calc Af Amer: >60 mL/min (ref >60)
GFR calc non Af Amer: 58 mL/min — ABNORMAL LOW (ref >60)
Glucose, Bld: 96 mg/dL (ref 70–99)
Potassium: 4.2 mmol/L (ref 3.5–5.1)
Sodium: 136 mmol/L (ref 135–145)
Total Bilirubin: 0.4 mg/dL (ref 0.3–1.2)
Total Protein: 6.8 g/dL (ref 6.5–8.1)

## 2019-06-24 LAB — CBC WITH DIFFERENTIAL/PLATELET
Abs Immature Granulocytes: 0.02 10*3/uL (ref 0.00–0.07)
Basophils Absolute: 0.1 10*3/uL (ref 0.0–0.1)
Basophils Relative: 1 %
Eosinophils Absolute: 0.4 10*3/uL (ref 0.0–0.5)
Eosinophils Relative: 6 %
HCT: 40.1 % (ref 36.0–46.0)
Hemoglobin: 12.5 g/dL (ref 12.0–15.0)
Immature Granulocytes: 0 %
Lymphocytes Relative: 25 %
Lymphs Abs: 1.5 10*3/uL (ref 0.7–4.0)
MCH: 30.3 pg (ref 26.0–34.0)
MCHC: 31.2 g/dL (ref 30.0–36.0)
MCV: 97.1 fL (ref 80.0–100.0)
Monocytes Absolute: 0.5 10*3/uL (ref 0.1–1.0)
Monocytes Relative: 9 %
Neutro Abs: 3.5 10*3/uL (ref 1.7–7.7)
Neutrophils Relative %: 59 %
Platelets: 255 10*3/uL (ref 150–400)
RBC: 4.13 MIL/uL (ref 3.87–5.11)
RDW: 15.6 % — ABNORMAL HIGH (ref 11.5–15.5)
WBC: 6 10*3/uL (ref 4.0–10.5)
nRBC: 0 % (ref 0.0–0.2)

## 2019-06-24 LAB — LIPASE, BLOOD: Lipase: 39 U/L (ref 11–51)

## 2019-06-24 NOTE — ED Triage Notes (Signed)
Pt sent from [facility] due to her having urostomy pulled out.

## 2019-06-24 NOTE — Discharge Instructions (Signed)
The drain has come out of your gallbladder.  I discussed this with your general surgeon who recommends that we do not put the drain back in We have placed an ostomy bag over top of the opening which will need to stay in place, it may be drained daily, it may be changed every 3 days with a new ostomy bag Please follow-up with a general surgeon in the office within 2 weeks for a recheck You should have blood work drawn within 2 weeks to check your liver function If you are developing increasing pain, jaundice, fever, vomiting or any other worsening symptoms return to the emergency department

## 2019-06-24 NOTE — ED Provider Notes (Signed)
[facility] EMERGENCY DEPARTMENT Provider Note   CSN: [id number] Arrival date & time: 06/24/19  [id number]     History   Chief Complaint Chief Complaint  Patient presents with  . tube replacement    HPI Cynthia [name] is a 85 y.o. female.     HPI  This patient is a 85 year old female with a known history of breast cancer status post right-sided mastectomy, history of partial colectomy, history of cholecystitis status post cholecystostomy in April 2018.  She has followed with general surgery in the past but now is primarily followed by interventional radiology for tube changes every couple of months most recently a couple of weeks ago.  Unfortunately this morning at the nursing facility where the patient lives her tube had been pulled out, it is unclear exactly what happened and the patient has absolutely no complaints in fact because of her dementia she is not sure that she is even in the hospital or why she is here.  She has no complaints.  Level 5 caveat applies.  Past Medical History:  Diagnosis Date  . Breast cancer ([place])   . Cognitive communication deficit   . Dementia ([place])   . Difficulty in walking(719.7)   . Fall   . Muscle weakness (generalized)   . Urinary tract infection, site not specified   . Vitamin B deficiency     Patient Active Problem List   Diagnosis Date Noted  . Chronic constipation 12/20/2018  . S/P Cholecystostomy 01/31/2017  . Cholecystitis, acute with cholelithiasis 01/26/2017  . HTN (hypertension) 08/26/2016  . Breast cancer ([place]) 12/11/2013  . Benign neoplasm of colon 12/11/2013  . Dementia ([place]) 12/11/2013  . C2 cervical fracture ([place]) 12/08/2013    Past Surgical History:  Procedure Laterality Date  . COLON SURGERY    . IR CATHETER TUBE CHANGE  05/07/2017  . IR EXCHANGE BILIARY DRAIN  07/02/2017  . IR EXCHANGE BILIARY DRAIN  07/10/2017  . IR EXCHANGE BILIARY DRAIN  08/28/2017  . IR EXCHANGE BILIARY DRAIN  10/11/2017  . IR EXCHANGE BILIARY  DRAIN  10/28/2017  . IR EXCHANGE BILIARY DRAIN  12/23/2017  . IR EXCHANGE BILIARY DRAIN  01/23/2018  . IR EXCHANGE BILIARY DRAIN  02/17/2018  . IR EXCHANGE BILIARY DRAIN  03/20/2018  . IR EXCHANGE BILIARY DRAIN  04/17/2018  . IR EXCHANGE BILIARY DRAIN  05/29/2018  . IR EXCHANGE BILIARY DRAIN  07/24/2018  . IR EXCHANGE BILIARY DRAIN  09/22/2018  . IR EXCHANGE BILIARY DRAIN  11/20/2018  . IR EXCHANGE BILIARY DRAIN  02/11/2019  . IR EXCHANGE BILIARY DRAIN  04/14/2019  . IR EXCHANGE BILIARY DRAIN  06/09/2019  . IR PERC CHOLECYSTOSTOMY  01/27/2017  . IR RADIOLOGIST EVAL & MGMT  03/05/2017  . MASTECTOMY, PARTIAL Right      OB History   No obstetric history on file.      Home Medications    Prior to Admission medications   Medication Sig Start Date End Date Taking? Authorizing Provider  acetaminophen (TYLENOL) 325 MG tablet Take 650 mg by mouth every 4 (four) hours as needed.    [provider]  calcium carbonate (TUMS) 500 MG chewable tablet Chew 1 tablet by mouth daily.    [provider]  Cholecalciferol 1000 units tablet Take 1,000 Units by mouth at bedtime.     [provider]  Cyanocobalamin (VITAMIN B-12) 1000 MCG SUBL Place 1 tablet under the tongue at bedtime.    [provider]  donepezil (ARICEPT)  10 MG tablet Take 1 tablet by mouth at bedtime. 01/24/17   [provider]  NON FORMULARY Diet type:  NAS    [provider]  Nutritional Supplements (NUTRITIONAL SUPPLEMENT PO) Magic Cup at bedtime for supplement    [provider]  polyethylene glycol (MIRALAX / GLYCOLAX) packet Take 17 g by mouth daily as needed.     [provider]  Sodium Chloride Flush (NORMAL SALINE FLUSH) 0.9 % SOLN Flush cholecystostomy tube once every other  day with 5 ml and then recap    [provider]    Family History Family History  Problem Relation Age of Onset  . Cancer Father        ? primary  . Diabetes Neg Hx   . Heart disease  Neg Hx   . Stroke Neg Hx     Social History Social History   Tobacco Use  . Smoking status: Never Smoker  . Smokeless tobacco: Never Used  Substance Use Topics  . Alcohol use: No  . Drug use: No     Allergies   Patient has no known allergies.   Review of Systems Review of Systems  Unable to perform ROS: Dementia     Physical Exam Updated Vital Signs BP (!) 139/58   Pulse 73   Temp (!) 97.4 F (36.3 C) (Oral)   Resp 12   Ht 1.549 m (5\' 1" )   Wt 67.1 kg   SpO2 92%   BMI 27.96 kg/m   Physical Exam Vitals signs and nursing note reviewed.  Constitutional:      General: She is not in acute distress.    Appearance: She is well-developed.  HENT:     Head: Normocephalic and atraumatic.     Mouth/Throat:     Pharynx: No oropharyngeal exudate.  Eyes:     General: No scleral icterus.       Right eye: No discharge.        Left eye: No discharge.     Conjunctiva/sclera: Conjunctivae normal.     Pupils: Pupils are equal, round, and reactive to light.  Neck:     Musculoskeletal: Normal range of motion and neck supple.     Thyroid: No thyromegaly.     Vascular: No JVD.  Cardiovascular:     Rate and Rhythm: Normal rate and regular rhythm.     Heart sounds: Normal heart sounds. No murmur. No friction rub. No gallop.   Pulmonary:     Effort: Pulmonary effort is normal. No respiratory distress.     Breath sounds: Normal breath sounds. No wheezing or rales.  Abdominal:     General: Bowel sounds are normal. There is no distension.     Palpations: Abdomen is soft. There is no mass.     Tenderness: There is no abdominal tenderness.     Comments: Cholecystostomy site clean, tube has been completely removed, no bleeding, no drainage, no tenderness of the entire abdomen  Musculoskeletal: Normal range of motion.        General: No tenderness.  Lymphadenopathy:     Cervical: No cervical adenopathy.  Skin:    General: Skin is warm and dry.     Findings: No erythema or  rash.  Neurological:     Mental Status: She is alert.     Coordination: Coordination normal.  Psychiatric:        Behavior: Behavior normal.      ED Treatments / Results  Labs (all labs ordered  are listed, but only abnormal results are displayed) Labs Reviewed  COMPREHENSIVE METABOLIC PANEL - Abnormal; Notable for the following components:      Result Value   Calcium 8.6 (*)    GFR calc non Af Amer 58 (*)    Anion gap 4 (*)    All other components within normal limits  CBC WITH DIFFERENTIAL/PLATELET - Abnormal; Notable for the following components:   RDW 15.6 (*)    All other components within normal limits  LIPASE, BLOOD    EKG None  Radiology No results found.  Procedures Procedures (including critical care time)  Medications Ordered in ED Medications - No data to display   Initial Impression / Assessment and Plan / ED Course  I have reviewed the triage vital signs and the nursing notes.  Pertinent labs & imaging results that were available during my care of the patient were reviewed by me and considered in my medical decision making (see chart for details).  Clinical Course as of Jun 23 899  Wed Jun 24, 2019  0803 General surgery consulted - Dr. Constance Haw has made reccomendations to place ostomy bag incase epithelialized and to avoid replacing tube at this time given the patent biliary system on the last IR images.   [BM]  [id number] CBC and liver function tests are normal, patient stable for discharge with ostomy placement   [BM]    Clinical Course User Index [BM] Noemi Chapel, MD       The patient's exam is unremarkable, unfortunately the tube is been removed however looking back at the images from her most recent procedure in August 2018, with the general surgeon Dr. Constance Haw at the computer with me we have looked at the images and found that the biliary system seems open and patent, there is no obstructing stones based on these images and she has recommended  that the patient be placed into a ostomy bag which would allow this potentially epithelialized tract to drain externally.  She does not recommend replacing the biliary drain at this time.  We will obtain some basic lab work to make sure that she does not have severely elevated LFTs in which case we would need to change the plan.  Otherwise the patient appears well without any abdominal tenderness, jaundice or any other complaints.  Final Clinical Impressions(s) / ED Diagnoses   Final diagnoses:  Biliary drain displacement, initial encounter    ED Discharge Orders    None       Noemi Chapel, MD 06/24/19 0900

## 2019-07-09 ENCOUNTER — Encounter (HOSPITAL_COMMUNITY)
Admission: RE | Admit: 2019-07-09 | Discharge: 2019-07-09 | Disposition: A | Discharge status: Home / Self Care | Payer: Medicare Other | Source: Skilled Nursing Facility | Attending: Internal Medicine | Admitting: Internal Medicine

## 2019-07-09 ENCOUNTER — Ambulatory Visit: Payer: Medicare Other | Admitting: General Surgery

## 2019-07-09 DIAGNOSIS — E538 Deficiency of other specified B group vitamins: Secondary | ICD-10-CM | POA: Diagnosis not present

## 2019-07-09 LAB — HEPATIC FUNCTION PANEL
ALT: 12 U/L (ref 0–44)
AST: 16 U/L (ref 15–41)
Albumin: 3.6 g/dL (ref 3.5–5.0)
Alkaline Phosphatase: 69 U/L (ref 38–126)
Bilirubin, Direct: <0.1 mg/dL (ref 0.0–0.2)
Total Bilirubin: 0.3 mg/dL (ref 0.3–1.2)
Total Protein: 7.2 g/dL (ref 6.5–8.1)

## 2019-07-13 ENCOUNTER — Encounter: Payer: Self-pay | Admitting: Adult Health

## 2019-07-13 NOTE — Progress Notes (Signed)
Location:    [facility] Room Number: 141/D Place of Service:  SNF (31)   CODE STATUS: Full Code  No Known Allergies  Chief Complaint  Patient presents with  . Acute Visit    Abdoman Distension    HPI:    Past Medical History:  Diagnosis Date  . Breast cancer ([place])   . Cognitive communication deficit   . Dementia ([place])   . Difficulty in walking(719.7)   . Fall   . Muscle weakness (generalized)   . Urinary tract infection, site not specified   . Vitamin B deficiency     Past Surgical History:  Procedure Laterality Date  . COLON SURGERY    . IR CATHETER TUBE CHANGE  05/07/2017  . IR EXCHANGE BILIARY DRAIN  07/02/2017  . IR EXCHANGE BILIARY DRAIN  07/10/2017  . IR EXCHANGE BILIARY DRAIN  08/28/2017  . IR EXCHANGE BILIARY DRAIN  10/11/2017  . IR EXCHANGE BILIARY DRAIN  10/28/2017  . IR EXCHANGE BILIARY DRAIN  12/23/2017  . IR EXCHANGE BILIARY DRAIN  01/23/2018  . IR EXCHANGE BILIARY DRAIN  02/17/2018  . IR EXCHANGE BILIARY DRAIN  03/20/2018  . IR EXCHANGE BILIARY DRAIN  04/17/2018  . IR EXCHANGE BILIARY DRAIN  05/29/2018  . IR EXCHANGE BILIARY DRAIN  07/24/2018  . IR EXCHANGE BILIARY DRAIN  09/22/2018  . IR EXCHANGE BILIARY DRAIN  11/20/2018  . IR EXCHANGE BILIARY DRAIN  02/11/2019  . IR EXCHANGE BILIARY DRAIN  04/14/2019  . IR EXCHANGE BILIARY DRAIN  06/09/2019  . IR PERC CHOLECYSTOSTOMY  01/27/2017  . IR RADIOLOGIST EVAL & MGMT  03/05/2017  . MASTECTOMY, PARTIAL Right     Social History   Socioeconomic History  . Marital status: Widowed    Spouse name: Not on file  . Number of children: Not on file  . Years of education: Not on file  . Highest education level: Not on file  Occupational History  . Not on file  Social Needs  . Financial resource strain: Not hard at all  . Food insecurity    Worry: Never true    Inability: Never true  . Transportation needs    Medical: No    Non-medical: No  Tobacco Use  . Smoking status: Never Smoker  .  Smokeless tobacco: Never Used  Substance and Sexual Activity  . Alcohol use: No  . Drug use: No  . Sexual activity: Not on file  Lifestyle  . Physical activity    Days per week: 0 days    Minutes per session: 0 min  . Stress: Not at all  Relationships  . Social Herbalist on phone: Never    Gets together: Never    Attends religious service: Never    Active member of club or organization: No    Attends meetings of clubs or organizations: Never    Relationship status: Widowed  . Intimate partner violence    Fear of current or ex partner: No    Emotionally abused: No    Physically abused: No    Forced sexual activity: No  Other Topics Concern  . Not on file  Social History Narrative  . Not on file   Family History  Problem Relation Age of Onset  . Cancer Father        ? primary  . Diabetes Neg Hx   . Heart disease Neg Hx   . Stroke Neg Hx  VITAL SIGNS BP (!) 147/75   Pulse 76   Temp 98 F (36.7 C) (Oral)   Resp 18   Ht 5\' 3"  (1.6 m)   Wt 147 lb 3.2 oz (66.8 kg)   SpO2 91%   BMI 26.08 kg/m   Outpatient Encounter Medications as of 07/13/2019  Medication Sig  . acetaminophen (TYLENOL) 325 MG tablet Take 650 mg by mouth every 4 (four) hours as needed.  . calcium carbonate (TUMS) 500 MG chewable tablet Chew 1 tablet by mouth daily.  . Cholecalciferol 1000 units tablet Take 1,000 Units by mouth at bedtime.   . Cyanocobalamin (VITAMIN B-12) 1000 MCG SUBL Place 1 tablet under the tongue at bedtime.  . donepezil (ARICEPT) 10 MG tablet Take 1 tablet by mouth at bedtime.  . NON FORMULARY Diet type:  NAS  . Nutritional Supplements (NUTRITIONAL SUPPLEMENT PO) Magic Cup at bedtime for supplement  . polyethylene glycol (MIRALAX / GLYCOLAX) packet Take 17 g by mouth daily as needed.   . Sodium Chloride Flush (NORMAL SALINE FLUSH) 0.9 % SOLN Flush cholecystostomy tube once every other  day with 5 ml and then recap   No facility-administered encounter  medications on file as of 07/13/2019.      SIGNIFICANT DIAGNOSTIC EXAMS       ASSESSMENT/ PLAN:    MD is aware of resident's narcotic use and is in agreement with current plan of care. We will attempt to wean resident as appropriate.  Ok Edwards NP [facility]  Contact [phone] Monday through Friday 8am- 5pm  After hours call [phone]

## 2019-07-16 ENCOUNTER — Non-Acute Institutional Stay (SKILLED_NURSING_FACILITY): Payer: Medicare Other | Admitting: Adult Health

## 2019-07-16 ENCOUNTER — Encounter: Payer: Self-pay | Admitting: Adult Health

## 2019-07-16 DIAGNOSIS — K5909 Other constipation: Secondary | ICD-10-CM | POA: Diagnosis not present

## 2019-07-16 DIAGNOSIS — F0150 Vascular dementia without behavioral disturbance: Secondary | ICD-10-CM | POA: Diagnosis not present

## 2019-07-16 DIAGNOSIS — I10 Essential (primary) hypertension: Secondary | ICD-10-CM | POA: Diagnosis not present

## 2019-07-16 NOTE — Progress Notes (Signed)
This encounter was created in error - please disregard.

## 2019-07-16 NOTE — Progress Notes (Signed)
Location:    [facility] Room Number: 141/D Place of Service:  SNF (31)   CODE STATUS: Full Code  No Known Allergies  Chief Complaint  Patient presents with  . Medical Management of Chronic Issues        Dementia without behavioral disturbance unspecified dementia type:  Essential hypertensin: Chronic constipation:   HPI:  She is a 85 year old long term resident of this facility being seen for the management of her chronic illnesses dementia; hypertension constipation. She denies any constipation or heart burn; denies any abdominal pain. There are no reports of anxiety of agitation.   Past Medical History:  Diagnosis Date  . Breast cancer ([place])   . Cognitive communication deficit   . Dementia ([place])   . Difficulty in walking(719.7)   . Fall   . Muscle weakness (generalized)   . Urinary tract infection, site not specified   . Vitamin B deficiency     Past Surgical History:  Procedure Laterality Date  . COLON SURGERY    . IR CATHETER TUBE CHANGE  05/07/2017  . IR EXCHANGE BILIARY DRAIN  07/02/2017  . IR EXCHANGE BILIARY DRAIN  07/10/2017  . IR EXCHANGE BILIARY DRAIN  08/28/2017  . IR EXCHANGE BILIARY DRAIN  10/11/2017  . IR EXCHANGE BILIARY DRAIN  10/28/2017  . IR EXCHANGE BILIARY DRAIN  12/23/2017  . IR EXCHANGE BILIARY DRAIN  01/23/2018  . IR EXCHANGE BILIARY DRAIN  02/17/2018  . IR EXCHANGE BILIARY DRAIN  03/20/2018  . IR EXCHANGE BILIARY DRAIN  04/17/2018  . IR EXCHANGE BILIARY DRAIN  05/29/2018  . IR EXCHANGE BILIARY DRAIN  07/24/2018  . IR EXCHANGE BILIARY DRAIN  09/22/2018  . IR EXCHANGE BILIARY DRAIN  11/20/2018  . IR EXCHANGE BILIARY DRAIN  02/11/2019  . IR EXCHANGE BILIARY DRAIN  04/14/2019  . IR EXCHANGE BILIARY DRAIN  06/09/2019  . IR PERC CHOLECYSTOSTOMY  01/27/2017  . IR RADIOLOGIST EVAL & MGMT  03/05/2017  . MASTECTOMY, PARTIAL Right     Social History   Socioeconomic History  . Marital status: Widowed    Spouse name: Not on file  . Number of  children: Not on file  . Years of education: Not on file  . Highest education level: Not on file  Occupational History  . Not on file  Social Needs  . Financial resource strain: Not hard at all  . Food insecurity    Worry: Never true    Inability: Never true  . Transportation needs    Medical: No    Non-medical: No  Tobacco Use  . Smoking status: Never Smoker  . Smokeless tobacco: Never Used  Substance and Sexual Activity  . Alcohol use: No  . Drug use: No  . Sexual activity: Not on file  Lifestyle  . Physical activity    Days per week: 0 days    Minutes per session: 0 min  . Stress: Not at all  Relationships  . Social Herbalist on phone: Never    Gets together: Never    Attends religious service: Never    Active member of club or organization: No    Attends meetings of clubs or organizations: Never    Relationship status: Widowed  . Intimate partner violence    Fear of current or ex partner: No    Emotionally abused: No    Physically abused: No    Forced sexual activity: No  Other Topics Concern  .  Not on file  Social History Narrative  . Not on file   Family History  Problem Relation Age of Onset  . Cancer Father        ? primary  . Diabetes Neg Hx   . Heart disease Neg Hx   . Stroke Neg Hx       VITAL SIGNS BP (!) 147/75   Pulse 76   Temp (!) 97.5 F (36.4 C) (Oral)   Resp 18   Ht 5\' 3"  (1.6 m)   Wt 147 lb 3.2 oz (66.8 kg)   SpO2 91%   BMI 26.08 kg/m   Outpatient Encounter Medications as of 07/16/2019  Medication Sig  . acetaminophen (TYLENOL) 325 MG tablet Take 650 mg by mouth every 4 (four) hours as needed.  . calcium carbonate (TUMS) 500 MG chewable tablet Chew 1 tablet by mouth daily.  . Cholecalciferol 1000 units tablet Take 1,000 Units by mouth at bedtime.   . Cyanocobalamin (VITAMIN B-12) 1000 MCG SUBL Place 1 tablet under the tongue at bedtime.  . donepezil (ARICEPT) 10 MG tablet Take 1 tablet by mouth at bedtime.  . NON  FORMULARY Diet type:  NAS  . Nutritional Supplements (NUTRITIONAL SUPPLEMENT PO) Magic Cup at bedtime for supplement  . polyethylene glycol (MIRALAX / GLYCOLAX) packet Take 17 g by mouth daily as needed.   . [DISCONTINUED] Sodium Chloride Flush (NORMAL SALINE FLUSH) 0.9 % SOLN Flush cholecystostomy tube once every other  day with 5 ml and then recap   No facility-administered encounter medications on file as of 07/16/2019.      SIGNIFICANT DIAGNOSTIC EXAMS   LABS REVIEWED: PREVIOUS:   08-15-18: wbc 6.3 hgb 12.7; hct 41.9; mcv 97.2 plt 260; glucose 107; bun 16; creat 0.89; k+ 4.0; na++ 139; ca 8.9 liver normal albumin 3.7 02-17-19: wbc 6.1; hgb 12.9; hct 41.5; mcv 98.3 plt 269 glucose 92; bun 18; creat 0.91; k+ 4.3; na++ 139; ca 8.8 liver normal albumin 3.5   TODAY  06-24-19; wbc 6.0; hgb 12.5; hct 40.1; mcv 97.1; plt 255; glucose 96; bun 17; creat 0.87; k+ 4.2; na++ 136; ca 8.6; albumin 3.5; lipase 39 07-09-19: liver normal albumin 3.6   Review of Systems  Constitutional: Negative for malaise/fatigue.  Respiratory: Negative for cough and shortness of breath.   Cardiovascular: Negative for chest pain, palpitations and leg swelling.  Gastrointestinal: Negative for abdominal pain, constipation and heartburn.  Musculoskeletal: Negative for back pain, joint pain and myalgias.  Skin: Negative.   Neurological: Negative for dizziness.  Psychiatric/Behavioral: The patient is not nervous/anxious.     Physical Exam Constitutional:      General: She is not in acute distress.    Appearance: She is well-developed. She is not diaphoretic.  Neck:     Musculoskeletal: Neck supple.     Thyroid: No thyromegaly.  Cardiovascular:     Rate and Rhythm: Normal rate and regular rhythm.     Pulses: Normal pulses.     Heart sounds: Normal heart sounds.  Pulmonary:     Effort: Pulmonary effort is normal. No respiratory distress.     Breath sounds: Normal breath sounds.  Chest:     Comments: History  of right partial mastectomy   Abdominal:     General: Bowel sounds are normal. There is no distension.     Palpations: Abdomen is soft.     Tenderness: There is no abdominal tenderness.     Comments: History of right hemi colectomy  Bili drain out; bag in place   Musculoskeletal:     Right lower leg: Edema present.     Left lower leg: Edema present.     Comments: Trace bilateral lower extremity edema Is able to move all extremities  History of C2 fracture  Lymphadenopathy:     Cervical: No cervical adenopathy.  Skin:    General: Skin is warm and dry.  Neurological:     Mental Status: She is alert. Mental status is at baseline.  Psychiatric:        Mood and Affect: Mood normal.        ASSESSMENT/ PLAN:  TODAY;   1. Dementia without behavioral disturbance unspecified dementia type: no significant change in status; weight is 147 pounds will continue aricept 10 mg daily   2. Essential hypertensin: is stable will monitor b/p 147/75  3. Chronic constipation: is stable will continue miralax daily as needed    PREVIOUS   4. Malignant neoplasm of right female breast unspecified estrogen receptor status unspecified site of breast: history of right partial mastectomy will monitor   5. Calculus of gallbladder with acute cholecystitis and obstruction; bili tube has been pulled out is stable will monitor     MD is aware of resident's narcotic use and is in agreement with current plan of care. We will attempt to wean resident as appropriate.  Ok Edwards NP [facility]  Contact [phone] Monday through Friday 8am- 5pm  After hours call [phone]

## 2019-07-21 ENCOUNTER — Encounter (HOSPITAL_COMMUNITY)
Admission: RE | Admit: 2019-07-21 | Discharge: 2019-07-21 | Disposition: A | Discharge status: Home / Self Care | Payer: Medicare Other | Source: Skilled Nursing Facility | Attending: Internal Medicine | Admitting: Internal Medicine

## 2019-07-21 DIAGNOSIS — R21 Rash and other nonspecific skin eruption: Secondary | ICD-10-CM | POA: Diagnosis not present

## 2019-07-21 DIAGNOSIS — E039 Hypothyroidism, unspecified: Secondary | ICD-10-CM | POA: Diagnosis not present

## 2019-07-21 DIAGNOSIS — F0390 Unspecified dementia without behavioral disturbance: Secondary | ICD-10-CM | POA: Diagnosis present

## 2019-07-21 DIAGNOSIS — E538 Deficiency of other specified B group vitamins: Secondary | ICD-10-CM | POA: Diagnosis not present

## 2019-07-21 DIAGNOSIS — Z978 Presence of other specified devices: Secondary | ICD-10-CM | POA: Insufficient documentation

## 2019-07-21 LAB — BASIC METABOLIC PANEL
Anion gap: 7 (ref 5–15)
BUN: 19 mg/dL (ref 8–23)
CO2: 27 mmol/L (ref 22–32)
Calcium: 8.6 mg/dL — ABNORMAL LOW (ref 8.9–10.3)
Chloride: 104 mmol/L (ref 98–111)
Creatinine, Ser: 0.89 mg/dL (ref 0.44–1.00)
GFR calc Af Amer: >60 mL/min (ref >60)
GFR calc non Af Amer: 57 mL/min — ABNORMAL LOW (ref >60)
Glucose, Bld: 92 mg/dL (ref 70–99)
Potassium: 4.1 mmol/L (ref 3.5–5.1)
Sodium: 138 mmol/L (ref 135–145)

## 2019-07-21 LAB — CBC
HCT: 38.5 % (ref 36.0–46.0)
Hemoglobin: 11.9 g/dL — ABNORMAL LOW (ref 12.0–15.0)
MCH: 30 pg (ref 26.0–34.0)
MCHC: 30.9 g/dL (ref 30.0–36.0)
MCV: 97 fL (ref 80.0–100.0)
Platelets: 234 10*3/uL (ref 150–400)
RBC: 3.97 MIL/uL (ref 3.87–5.11)
RDW: 15.8 % — ABNORMAL HIGH (ref 11.5–15.5)
WBC: 5.2 10*3/uL (ref 4.0–10.5)
nRBC: 0 % (ref 0.0–0.2)

## 2019-08-04 ENCOUNTER — Inpatient Hospital Stay (HOSPITAL_COMMUNITY): Admit: 2019-08-04 | Payer: Medicare Other

## 2019-08-04 ENCOUNTER — Encounter (HOSPITAL_COMMUNITY): Payer: Self-pay

## 2019-08-12 ENCOUNTER — Other Ambulatory Visit: Payer: Self-pay | Admitting: Adult Health

## 2019-08-12 ENCOUNTER — Encounter: Payer: Self-pay | Admitting: Adult Health

## 2019-08-12 ENCOUNTER — Non-Acute Institutional Stay (SKILLED_NURSING_FACILITY): Payer: Medicare Other | Admitting: Adult Health

## 2019-08-12 DIAGNOSIS — F0150 Vascular dementia without behavioral disturbance: Secondary | ICD-10-CM

## 2019-08-12 DIAGNOSIS — I10 Essential (primary) hypertension: Secondary | ICD-10-CM | POA: Diagnosis not present

## 2019-08-12 DIAGNOSIS — K5909 Other constipation: Secondary | ICD-10-CM | POA: Diagnosis not present

## 2019-08-12 NOTE — Progress Notes (Signed)
Location:  [facility] Room Number: 141-D Place of Service:  SNF (31)   CODE STATUS: Full Code  No Known Allergies  Chief Complaint  Patient presents with  . Acute Visit    care plan     HPI:  We have come together for her care plan meeting. BIMS 8/15 mood 0/30. There are no reports of falls. Her weight has been stable. Her appetite is good. Her bili drain is out and is tolerating easily. There are no reports of uncontrolled pain. There are no reports of agitation or anxiety. She continues to be followed for her chronic illnesses including: hypertension; dementia; constipation.   Past Medical History:  Diagnosis Date  . Breast cancer ([place])   . Cognitive communication deficit   . Dementia ([place])   . Difficulty in walking(719.7)   . Fall   . Muscle weakness (generalized)   . Urinary tract infection, site not specified   . Vitamin B deficiency     Past Surgical History:  Procedure Laterality Date  . COLON SURGERY    . IR CATHETER TUBE CHANGE  05/07/2017  . IR EXCHANGE BILIARY DRAIN  07/02/2017  . IR EXCHANGE BILIARY DRAIN  07/10/2017  . IR EXCHANGE BILIARY DRAIN  08/28/2017  . IR EXCHANGE BILIARY DRAIN  10/11/2017  . IR EXCHANGE BILIARY DRAIN  10/28/2017  . IR EXCHANGE BILIARY DRAIN  12/23/2017  . IR EXCHANGE BILIARY DRAIN  01/23/2018  . IR EXCHANGE BILIARY DRAIN  02/17/2018  . IR EXCHANGE BILIARY DRAIN  03/20/2018  . IR EXCHANGE BILIARY DRAIN  04/17/2018  . IR EXCHANGE BILIARY DRAIN  05/29/2018  . IR EXCHANGE BILIARY DRAIN  07/24/2018  . IR EXCHANGE BILIARY DRAIN  09/22/2018  . IR EXCHANGE BILIARY DRAIN  11/20/2018  . IR EXCHANGE BILIARY DRAIN  02/11/2019  . IR EXCHANGE BILIARY DRAIN  04/14/2019  . IR EXCHANGE BILIARY DRAIN  06/09/2019  . IR PERC CHOLECYSTOSTOMY  01/27/2017  . IR RADIOLOGIST EVAL & MGMT  03/05/2017  . MASTECTOMY, PARTIAL Right     Social History   Socioeconomic History  . Marital status: Widowed    Spouse name: Not on file  . Number of  children: Not on file  . Years of education: Not on file  . Highest education level: Not on file  Occupational History  . Not on file  Social Needs  . Financial resource strain: Not hard at all  . Food insecurity    Worry: Never true    Inability: Never true  . Transportation needs    Medical: No    Non-medical: No  Tobacco Use  . Smoking status: Never Smoker  . Smokeless tobacco: Never Used  Substance and Sexual Activity  . Alcohol use: No  . Drug use: No  . Sexual activity: Not on file  Lifestyle  . Physical activity    Days per week: 0 days    Minutes per session: 0 min  . Stress: Not at all  Relationships  . Social Herbalist on phone: Never    Gets together: Never    Attends religious service: Never    Active member of club or organization: No    Attends meetings of clubs or organizations: Never    Relationship status: Widowed  . Intimate partner violence    Fear of current or ex partner: No    Emotionally abused: No    Physically abused: No    Forced sexual activity: No  Other Topics Concern  . Not on file  Social History Narrative  . Not on file   Family History  Problem Relation Age of Onset  . Cancer Father        ? primary  . Diabetes Neg Hx   . Heart disease Neg Hx   . Stroke Neg Hx       VITAL SIGNS BP 129/74   Pulse 74   Temp (!) 97.2 F (36.2 C) (Oral)   Resp 20   Ht 5\' 3"  (1.6 m)   Wt 145 lb 9.6 oz (66 kg)   SpO2 91%   BMI 25.79 kg/m   Outpatient Encounter Medications as of 08/12/2019  Medication Sig  . acetaminophen (TYLENOL) 325 MG tablet Take 650 mg by mouth every 4 (four) hours as needed.  . calcium carbonate (TUMS) 500 MG chewable tablet Chew 1 tablet by mouth daily.  . Cholecalciferol 1000 units tablet Take 1,000 Units by mouth at bedtime.   . Cyanocobalamin (VITAMIN B-12) 1000 MCG SUBL Place 1 tablet under the tongue at bedtime.  . diphenhydrAMINE (BENADRYL) 25 mg capsule Take 25 mg by mouth daily as needed  ([place]).  . donepezil (ARICEPT) 10 MG tablet Take 1 tablet by mouth at bedtime.  . NON FORMULARY Diet type:  NAS  . Nutritional Supplements (NUTRITIONAL SUPPLEMENT PO) Magic Cup at bedtime for supplement  . polyethylene glycol (MIRALAX / GLYCOLAX) packet Take 17 g by mouth daily as needed.    No facility-administered encounter medications on file as of 08/12/2019.      SIGNIFICANT DIAGNOSTIC EXAMS   LABS REVIEWED: PREVIOUS:   02-17-19: wbc 6.1; hgb 12.9; hct 41.5; mcv 98.3 plt 269 glucose 92; bun 18; creat 0.91; k+ 4.3; na++ 139; ca 8.8 liver normal albumin 3.5  06-24-19; wbc 6.0; hgb 12.5; hct 40.1; mcv 97.1; plt 255; glucose 96; bun 17; creat 0.87; k+ 4.2; na++ 136; ca 8.6; albumin 3.5; lipase 39 07-09-19: liver normal albumin 3.6  NO NEW LABS.    Review of Systems  Constitutional: Negative for malaise/fatigue.  Respiratory: Negative for cough and shortness of breath.   Cardiovascular: Negative for chest pain, palpitations and leg swelling.  Gastrointestinal: Negative for abdominal pain, constipation and heartburn.  Musculoskeletal: Negative for back pain, joint pain and myalgias.  Skin: Negative.   Neurological: Negative for dizziness.  Psychiatric/Behavioral: The patient is not nervous/anxious.     Physical Exam Constitutional:      General: She is not in acute distress.    Appearance: She is well-developed. She is not diaphoretic.  Neck:     Musculoskeletal: Neck supple.     Thyroid: No thyromegaly.  Cardiovascular:     Rate and Rhythm: Normal rate and regular rhythm.     Pulses: Normal pulses.     Heart sounds: Normal heart sounds.  Pulmonary:     Effort: Pulmonary effort is normal. No respiratory distress.     Breath sounds: Normal breath sounds.  Chest:     Comments: History of right partial mastectomy  Abdominal:     General: Bowel sounds are normal. There is no distension.     Palpations: Abdomen is soft.     Tenderness: There is no abdominal tenderness.      Comments: History of right hemi colectomy      Bili drain out; bag in place    Musculoskeletal:     Right lower leg: No edema.     Left lower leg: No edema.  Comments: Trace bilateral lower extremity edema Is able to move all extremities  History of C2 fracture   Lymphadenopathy:     Cervical: No cervical adenopathy.  Skin:    General: Skin is warm and dry.  Neurological:     Mental Status: She is alert. Mental status is at baseline.  Psychiatric:        Mood and Affect: Mood normal.       ASSESSMENT/ PLAN:  1. Essential hypertension 2. Vascular dementia without behavorial disturbance 3. Chronic constipation.   Will continue current medications Will continue current plan of care Will continue to monitor her status.     MD is aware of resident's narcotic use and is in agreement with current plan of care. We will attempt to wean resident as appropriate.  Ok Edwards NP [facility]  Contact [phone] Monday through Friday 8am- 5pm  After hours call [phone]

## 2019-08-14 DIAGNOSIS — L603 Nail dystrophy: Secondary | ICD-10-CM | POA: Diagnosis not present

## 2019-08-14 DIAGNOSIS — L84 Corns and callosities: Secondary | ICD-10-CM | POA: Diagnosis not present

## 2019-08-14 DIAGNOSIS — B351 Tinea unguium: Secondary | ICD-10-CM | POA: Diagnosis not present

## 2019-08-14 DIAGNOSIS — I739 Peripheral vascular disease, unspecified: Secondary | ICD-10-CM | POA: Diagnosis not present

## 2019-08-19 ENCOUNTER — Non-Acute Institutional Stay (SKILLED_NURSING_FACILITY): Payer: Medicare Other | Admitting: Internal Medicine

## 2019-08-19 ENCOUNTER — Encounter: Payer: Self-pay | Admitting: Internal Medicine

## 2019-08-19 DIAGNOSIS — Z434 Encounter for attention to other artificial openings of digestive tract: Secondary | ICD-10-CM | POA: Diagnosis not present

## 2019-08-19 DIAGNOSIS — S12191D Other nondisplaced fracture of second cervical vertebra, subsequent encounter for fracture with routine healing: Secondary | ICD-10-CM

## 2019-08-19 DIAGNOSIS — C50911 Malignant neoplasm of unspecified site of right female breast: Secondary | ICD-10-CM | POA: Diagnosis not present

## 2019-08-19 NOTE — Progress Notes (Signed)
Location:  [place] Room Number: 141/D Place of Service:  SNF (31)  Cynthia Duos, MD  Patient Care Team: Cynthia Duos, MD as PCP - General (Internal Medicine) Cynthia [name] Cynthia Bougie, NP as Nurse Practitioner ([place]) [facility], [place] ([place])  Extended Emergency Contact Information Primary Emergency Contact: Cynthia [name],  [address] Cynthia [name] of [place] Phone: [phone] Relation: Friend Secondary Emergency Contact: Cynthia [name] of [place] Phone: [phone] Relation: Daughter    Allergies: Patient has no known allergies.  Chief Complaint  Patient presents with  . Medical Management of Chronic Issues    Routine visit of medical managment    HPI: Patient is 85 y.o. female who is being seen for routine issues of history of C2 fracture, history of breast cancer, and status post cholecystectomy.  Past Medical History:  Diagnosis Date  . Breast cancer ([place])   . Cognitive communication deficit   . Dementia ([place])   . Difficulty in walking(719.7)   . Fall   . Muscle weakness (generalized)   . Urinary tract infection, site not specified   . Vitamin B deficiency     Past Surgical History:  Procedure Laterality Date  . COLON SURGERY    . IR CATHETER TUBE CHANGE  05/07/2017  . IR EXCHANGE BILIARY DRAIN  07/02/2017  . IR EXCHANGE BILIARY DRAIN  07/10/2017  . IR EXCHANGE BILIARY DRAIN  08/28/2017  . IR EXCHANGE BILIARY DRAIN  10/11/2017  . IR EXCHANGE BILIARY DRAIN  10/28/2017  . IR EXCHANGE BILIARY DRAIN  12/23/2017  . IR EXCHANGE BILIARY DRAIN  01/23/2018  . IR EXCHANGE BILIARY DRAIN  02/17/2018  . IR EXCHANGE BILIARY DRAIN  03/20/2018  . IR EXCHANGE BILIARY DRAIN  04/17/2018  . IR EXCHANGE BILIARY DRAIN  05/29/2018  . IR EXCHANGE BILIARY DRAIN  07/24/2018  . IR EXCHANGE BILIARY DRAIN  09/22/2018  . IR EXCHANGE BILIARY DRAIN  11/20/2018  . IR EXCHANGE BILIARY  DRAIN  02/11/2019  . IR EXCHANGE BILIARY DRAIN  04/14/2019  . IR EXCHANGE BILIARY DRAIN  06/09/2019  . IR PERC CHOLECYSTOSTOMY  01/27/2017  . IR RADIOLOGIST EVAL & MGMT  03/05/2017  . MASTECTOMY, PARTIAL Right     Outpatient Encounter Medications as of 08/19/2019  Medication Sig  . acetaminophen (TYLENOL) 325 MG tablet Take 650 mg by mouth every 4 (four) hours as needed.  . calcium carbonate (TUMS) 500 MG chewable tablet Chew 1 tablet by mouth daily.  . Cholecalciferol 1000 units tablet Take 1,000 Units by mouth at bedtime.   . Cyanocobalamin (VITAMIN B-12) 1000 MCG SUBL Place 1 tablet under the tongue at bedtime.  . diphenhydrAMINE (BENADRYL) 25 mg capsule Take 25 mg by mouth daily as needed (Rash).  . donepezil (ARICEPT) 10 MG tablet Take 1 tablet by mouth at bedtime.  . NON FORMULARY Diet type:  NAS  . Nutritional Supplements (NUTRITIONAL SUPPLEMENT PO) Magic Cup at bedtime for supplement  . polyethylene glycol (MIRALAX / GLYCOLAX) packet Take 17 g by mouth daily as needed.    No facility-administered encounter medications on file as of 08/19/2019.     No orders of the defined types were placed in this encounter.   Immunization History  Administered Date(s) Administered  . Influenza-Unspecified 07/28/2014, 07/26/2017, 07/24/2018, 07/27/2019  . Pneumococcal Conjugate-13 06/21/2017  . Pneumococcal-Unspecified 08/01/2016  . Tdap 07/11/2017    Social History   Tobacco Use  .  Smoking status: Never Smoker  . Smokeless tobacco: Never Used  Substance Use Topics  . Alcohol use: No    Review of Systems  GENERAL:  no fevers, fatigue, appetite changes SKIN: No itching, rash HEENT: No complaint RESPIRATORY: No cough, wheezing, SOB CARDIAC: No chest pain, palpitations, lower extremity edema  GI: No abdominal pain, No N/V/D or constipation, No heartburn or reflux  GU: No dysuria, frequency or urgency, or incontinence  MUSCULOSKELETAL: No unrelieved bone/joint pain NEUROLOGIC: No  headache, dizziness  PSYCHIATRIC: No overt anxiety or sadness  Vitals:   08/19/19 1009  BP: 131/62  Pulse: 67  Resp: 16  Temp: 97.8 F (36.6 C)  SpO2: 91%   Body mass index is 25.79 kg/m. Physical Exam  GENERAL APPEARANCE: Dressed, sleeping in chair, minimally conversant, No acute distress  SKIN: No diaphoresis rash HEENT: Unremarkable RESPIRATORY: Breathing is even, unlabored. Lung sounds are clear   CARDIOVASCULAR: Heart RRR no murmurs, rubs or gallops. No peripheral edema  GASTROINTESTINAL: Abdomen is soft, non-tender, not distended w/ normal bowel sounds.  GENITOURINARY: Bladder non tender, not distended  MUSCULOSKELETAL: No abnormal joints or musculature NEUROLOGIC: Cranial nerves 2-12 grossly intact. Moves all extremities PSYCHIATRIC: Mood and affect appropriate to situation, no behavioral issues  Patient Active Problem List   Diagnosis Date Noted  . Chronic constipation 12/20/2018  . S/P Cholecystostomy 01/31/2017  . Cholecystitis, acute with cholelithiasis 01/26/2017  . HTN (hypertension) 08/26/2016  . Breast cancer ([place]) 12/11/2013  . Benign neoplasm of colon 12/11/2013  . Dementia ([place]) 12/11/2013  . C2 cervical fracture ([facility]) 12/08/2013    CMP     Component Value Date/Time   NA 138 07/21/2019 0717   NA 141 10/27/2015   K 4.1 07/21/2019 0717   CL 104 07/21/2019 0717   CO2 27 07/21/2019 0717   GLUCOSE 92 07/21/2019 0717   BUN 19 07/21/2019 0717   BUN 13 10/27/2015   CREATININE 0.89 07/21/2019 0717   CALCIUM 8.6 (L) 07/21/2019 0717   PROT 7.2 07/09/2019 1115   ALBUMIN 3.6 07/09/2019 1115   AST 16 07/09/2019 1115   ALT 12 07/09/2019 1115   ALKPHOS 69 07/09/2019 1115   BILITOT 0.3 07/09/2019 1115   GFRNONAA 57 (L) 07/21/2019 0717   GFRAA >60 07/21/2019 0717   Recent Labs    02/17/19 0700 06/24/19 0808 07/21/19 0717  NA 139 136 138  K 4.3 4.2 4.1  CL 106 105 104  CO2 26 27 27   GLUCOSE 92 96 92  BUN 18 17 19   CREATININE 0.91 0.87 0.89   CALCIUM 8.8* 8.6* 8.6*   Recent Labs    02/17/19 0700 06/24/19 0808 07/09/19 1115  AST 20 16 16   ALT 21 13 12   ALKPHOS 79 66 69  BILITOT 0.6 0.4 0.3  PROT 7.4 6.8 7.2  ALBUMIN 3.5 3.5 3.6   Recent Labs    02/17/19 0700 06/24/19 0808 07/21/19 0717  WBC 6.1 6.0 5.2  NEUTROABS  --  3.5  --   HGB 12.9 12.5 11.9*  HCT 41.5 40.1 38.5  MCV 98.3 97.1 97.0  PLT 269 255 234   No results for input(s): CHOL, LDLCALC, TRIG in the last 8760 hours.  Invalid input(s): HCL No results found for: [facility] Lab Results  Component Value Date   TSH 3.877 01/02/2018   No results found for: HGBA1C No results found for: CHOL, HDL, LDLCALC, LDLDIRECT, TRIG, CHOLHDL  Significant Diagnostic Results in last 30 days:  No results found.  Assessment and Plan  C2 cervical fracture No apparent deficits, no pain; continue supportive care  Breast cancer [facility]) Status post partial right mastectomy; no recurrence; will monitor  S/P Cholecystostomy Patient with biliary drain for a long time, which is now out patient has not had any problems; continue to monitor     Cynthia Duos, MD

## 2019-08-23 ENCOUNTER — Encounter: Payer: Self-pay | Admitting: Internal Medicine

## 2019-08-23 NOTE — Assessment & Plan Note (Signed)
Status post partial right mastectomy; no recurrence; will monitor

## 2019-08-23 NOTE — Assessment & Plan Note (Signed)
No apparent deficits, no pain; continue supportive care

## 2019-08-23 NOTE — Assessment & Plan Note (Signed)
Patient with biliary drain for a long time, which is now out patient has not had any problems; continue to monitor

## 2019-08-26 ENCOUNTER — Ambulatory Visit (HOSPITAL_COMMUNITY)
Admission: RE | Admit: 2019-08-26 | Discharge: 2019-08-26 | Disposition: A | Discharge status: Home / Self Care | Payer: Medicare Other | Source: Ambulatory Visit | Attending: Adult Health | Admitting: Adult Health

## 2019-08-26 ENCOUNTER — Encounter: Payer: Self-pay | Admitting: Adult Health

## 2019-08-26 ENCOUNTER — Non-Acute Institutional Stay (SKILLED_NURSING_FACILITY): Payer: Medicare Other | Admitting: Adult Health

## 2019-08-26 ENCOUNTER — Other Ambulatory Visit (HOSPITAL_COMMUNITY)
Admission: RE | Admit: 2019-08-26 | Discharge: 2019-08-26 | Disposition: A | Discharge status: Home / Self Care | Payer: Medicare Other | Source: Skilled Nursing Facility | Attending: Adult Health | Admitting: Adult Health

## 2019-08-26 ENCOUNTER — Other Ambulatory Visit: Payer: Self-pay | Admitting: Adult Health

## 2019-08-26 DIAGNOSIS — E039 Hypothyroidism, unspecified: Secondary | ICD-10-CM | POA: Insufficient documentation

## 2019-08-26 DIAGNOSIS — K5730 Diverticulosis of large intestine without perforation or abscess without bleeding: Secondary | ICD-10-CM | POA: Insufficient documentation

## 2019-08-26 DIAGNOSIS — I700 Atherosclerosis of aorta: Secondary | ICD-10-CM | POA: Insufficient documentation

## 2019-08-26 DIAGNOSIS — L02211 Cutaneous abscess of abdominal wall: Secondary | ICD-10-CM | POA: Diagnosis not present

## 2019-08-26 DIAGNOSIS — K8020 Calculus of gallbladder without cholecystitis without obstruction: Secondary | ICD-10-CM | POA: Insufficient documentation

## 2019-08-26 DIAGNOSIS — Z978 Presence of other specified devices: Secondary | ICD-10-CM | POA: Insufficient documentation

## 2019-08-26 LAB — COMPREHENSIVE METABOLIC PANEL
ALT: 19 U/L (ref 0–44)
AST: 22 U/L (ref 15–41)
Albumin: 2.8 g/dL — ABNORMAL LOW (ref 3.5–5.0)
Alkaline Phosphatase: 67 U/L (ref 38–126)
Anion gap: 10 (ref 5–15)
BUN: 31 mg/dL — ABNORMAL HIGH (ref 8–23)
CO2: 26 mmol/L (ref 22–32)
Calcium: 8.6 mg/dL — ABNORMAL LOW (ref 8.9–10.3)
Chloride: 103 mmol/L (ref 98–111)
Creatinine, Ser: 1.02 mg/dL — ABNORMAL HIGH (ref 0.44–1.00)
GFR calc Af Amer: 55 mL/min — ABNORMAL LOW (ref >60)
GFR calc non Af Amer: 48 mL/min — ABNORMAL LOW (ref >60)
Glucose, Bld: 97 mg/dL (ref 70–99)
Potassium: 3.8 mmol/L (ref 3.5–5.1)
Sodium: 139 mmol/L (ref 135–145)
Total Bilirubin: 0.4 mg/dL (ref 0.3–1.2)
Total Protein: 6.4 g/dL — ABNORMAL LOW (ref 6.5–8.1)

## 2019-08-26 LAB — CBC WITH DIFFERENTIAL/PLATELET
Abs Immature Granulocytes: 0.04 10*3/uL (ref 0.00–0.07)
Basophils Absolute: 0 10*3/uL (ref 0.0–0.1)
Basophils Relative: 0 %
Eosinophils Absolute: 0.1 10*3/uL (ref 0.0–0.5)
Eosinophils Relative: 1 %
HCT: 35.7 % — ABNORMAL LOW (ref 36.0–46.0)
Hemoglobin: 11.3 g/dL — ABNORMAL LOW (ref 12.0–15.0)
Immature Granulocytes: 0 %
Lymphocytes Relative: 7 %
Lymphs Abs: 0.9 10*3/uL (ref 0.7–4.0)
MCH: 30.5 pg (ref 26.0–34.0)
MCHC: 31.7 g/dL (ref 30.0–36.0)
MCV: 96.5 fL (ref 80.0–100.0)
Monocytes Absolute: 0.7 10*3/uL (ref 0.1–1.0)
Monocytes Relative: 6 %
Neutro Abs: 10.1 10*3/uL — ABNORMAL HIGH (ref 1.7–7.7)
Neutrophils Relative %: 86 %
Platelets: 227 10*3/uL (ref 150–400)
RBC: 3.7 MIL/uL — ABNORMAL LOW (ref 3.87–5.11)
RDW: 15.9 % — ABNORMAL HIGH (ref 11.5–15.5)
WBC: 11.8 10*3/uL — ABNORMAL HIGH (ref 4.0–10.5)
nRBC: 0 % (ref 0.0–0.2)

## 2019-08-26 MED ORDER — HYDROCODONE-ACETAMINOPHEN 5-325 MG PO TABS: 1.0000 | ORAL_TABLET | Freq: Four times a day (QID) | ORAL | 0 refills | Status: AC | PRN

## 2019-08-26 NOTE — Progress Notes (Signed)
Location:  [place] Room Number: 141-D Place of Service:  SNF (31)   CODE STATUS: Full Code  No Known Allergies  Chief Complaint  Patient presents with  . Acute Visit    Patient is seen for an abdominal abscess    HPI:  She has a bili drain site which is draining purulent drainage. The area is hard; red; hot touch and is painful. There are no reports of fevers present. She does have some nausea present.   Past Medical History:  Diagnosis Date  . Breast cancer ([place])   . Cognitive communication deficit   . Dementia ([place])   . Difficulty in walking(719.7)   . Fall   . Muscle weakness (generalized)   . Urinary tract infection, site not specified   . Vitamin B deficiency     Past Surgical History:  Procedure Laterality Date  . COLON SURGERY    . IR CATHETER TUBE CHANGE  05/07/2017  . IR EXCHANGE BILIARY DRAIN  07/02/2017  . IR EXCHANGE BILIARY DRAIN  07/10/2017  . IR EXCHANGE BILIARY DRAIN  08/28/2017  . IR EXCHANGE BILIARY DRAIN  10/11/2017  . IR EXCHANGE BILIARY DRAIN  10/28/2017  . IR EXCHANGE BILIARY DRAIN  12/23/2017  . IR EXCHANGE BILIARY DRAIN  01/23/2018  . IR EXCHANGE BILIARY DRAIN  02/17/2018  . IR EXCHANGE BILIARY DRAIN  03/20/2018  . IR EXCHANGE BILIARY DRAIN  04/17/2018  . IR EXCHANGE BILIARY DRAIN  05/29/2018  . IR EXCHANGE BILIARY DRAIN  07/24/2018  . IR EXCHANGE BILIARY DRAIN  09/22/2018  . IR EXCHANGE BILIARY DRAIN  11/20/2018  . IR EXCHANGE BILIARY DRAIN  02/11/2019  . IR EXCHANGE BILIARY DRAIN  04/14/2019  . IR EXCHANGE BILIARY DRAIN  06/09/2019  . IR PERC CHOLECYSTOSTOMY  01/27/2017  . IR RADIOLOGIST EVAL & MGMT  03/05/2017  . MASTECTOMY, PARTIAL Right     Social History   Socioeconomic History  . Marital status: Widowed    Spouse name: Not on file  . Number of children: Not on file  . Years of education: Not on file  . Highest education level: Not on file  Occupational History  . Not on file  Social Needs  . Financial resource  strain: Not hard at all  . Food insecurity    Worry: Never true    Inability: Never true  . Transportation needs    Medical: No    Non-medical: No  Tobacco Use  . Smoking status: Never Smoker  . Smokeless tobacco: Never Used  Substance and Sexual Activity  . Alcohol use: No  . Drug use: No  . Sexual activity: Not on file  Lifestyle  . Physical activity    Days per week: 0 days    Minutes per session: 0 min  . Stress: Not at all  Relationships  . Social Herbalist on phone: Never    Gets together: Never    Attends religious service: Never    Active member of club or organization: No    Attends meetings of clubs or organizations: Never    Relationship status: Widowed  . Intimate partner violence    Fear of current or ex partner: No    Emotionally abused: No    Physically abused: No    Forced sexual activity: No  Other Topics Concern  . Not on file  Social History Narrative  . Not on file   Family History  Problem Relation Age of Onset  .  Cancer Father        ? primary  . Diabetes Neg Hx   . Heart disease Neg Hx   . Stroke Neg Hx       VITAL SIGNS BP 131/62   Pulse 67   Temp 98.5 F (36.9 C) (Oral)   Resp 16   Ht 5\' 3"  (1.6 m)   Wt 149 lb 3.2 oz (67.7 kg)   SpO2 91%   BMI 26.43 kg/m   Outpatient Encounter Medications as of 08/26/2019  Medication Sig  . acetaminophen (TYLENOL) 325 MG tablet Take 650 mg by mouth every 4 (four) hours as needed.  . calcium carbonate (TUMS) 500 MG chewable tablet Chew 1 tablet by mouth daily.  . Cholecalciferol 1000 units tablet Take 1,000 Units by mouth at bedtime.   . Cyanocobalamin (VITAMIN B-12) 1000 MCG SUBL Place 1 tablet under the tongue at bedtime.  . diphenhydrAMINE (BENADRYL) 25 mg capsule Take 25 mg by mouth daily as needed (Rash).  . donepezil (ARICEPT) 10 MG tablet Take 1 tablet by mouth at bedtime.  Marland Kitchen doxycycline (VIBRA-TABS) 100 MG tablet Take 100 mg by mouth 2 (two) times daily.  Marland Kitchen  HYDROcodone-acetaminophen (NORCO) 5-325 MG tablet Take 1 tablet by mouth every 6 (six) hours as needed for up to 5 days for moderate pain.  . NON FORMULARY Diet type:  NAS  . Nutritional Supplements (NUTRITIONAL SUPPLEMENT PO) Magic Cup at bedtime for supplement  . polyethylene glycol (MIRALAX / GLYCOLAX) packet Take 17 g by mouth daily as needed.    No facility-administered encounter medications on file as of 08/26/2019.      SIGNIFICANT DIAGNOSTIC EXAMS  TODAY;   08-26-19: ct of abdomen  1. Interval removal of the previously seen percutaneous cholecystostomy tube. Cholelithiasis with findings concerning for acute on chronic cholecystitis. Clinical correlation is recommended. Ultrasound may provide better evaluation of the gallbladder. 2. Diffuse colonic diverticulosis. No bowel obstruction. 3. Aortic Atherosclerosis   LABS REVIEWED: PREVIOUS:   02-17-19: wbc 6.1; hgb 12.9; hct 41.5; mcv 98.3 plt 269 glucose 92; bun 18; creat 0.91; k+ 4.3; na++ 139; ca 8.8 liver normal albumin 3.5  06-24-19; wbc 6.0; hgb 12.5; hct 40.1; mcv 97.1; plt 255; glucose 96; bun 17; creat 0.87; k+ 4.2; na++ 136; ca 8.6; albumin 3.5; lipase 39 07-09-19: liver normal albumin 3.6  TODAY'   07-21-19: wbc 5.2; hgb 11.9; hct 38.5; mcv 97.0 plt 234; glucose 92; bun 19; creat 0.89; k+ 4.1; na++ 138; ca 8.6 08-26-19: wbc 11.8; hgb 11.3; hct 35.7; mcv 96.5 plt 227; glucose 97; bun 31; creat 1.02; k+ 3.8; na++ 139; ca 8.6; liver normal albumin 2.8      Review of Systems  Constitutional: Negative for malaise/fatigue.  Respiratory: Negative for cough and shortness of breath.   Cardiovascular: Negative for chest pain, palpitations and leg swelling.  Gastrointestinal: Positive for abdominal pain and nausea. Negative for constipation and heartburn.  Musculoskeletal: Negative for back pain, joint pain and myalgias.  Skin:       Sore is draining   Neurological: Negative for dizziness.  Psychiatric/Behavioral: The patient is  not nervous/anxious.    Physical Exam Constitutional:      General: She is not in acute distress.    Appearance: She is well-developed. She is not diaphoretic.  Neck:     Musculoskeletal: Neck supple.     Thyroid: No thyromegaly.  Cardiovascular:     Rate and Rhythm: Normal rate and regular rhythm.     Pulses:  Normal pulses.     Heart sounds: Normal heart sounds.  Pulmonary:     Effort: Pulmonary effort is normal. No respiratory distress.     Breath sounds: Normal breath sounds.  Chest:     Comments: History of right partial mastectomy  Abdominal:     General: Bowel sounds are normal. There is no distension.     Palpations: Abdomen is soft.     Tenderness: There is no abdominal tenderness.     Comments:  History of right hemi colectomy       Musculoskeletal:     Right lower leg: No edema.     Left lower leg: No edema.     Comments: Trace bilateral lower extremity edema Is able to move all extremities  History of C2 fracture    Lymphadenopathy:     Cervical: No cervical adenopathy.  Skin:    General: Skin is warm and dry.     Comments: Bili site red hot purulent drainage present Very painful to palpation   Neurological:     Mental Status: She is alert. Mental status is at baseline.  Psychiatric:        Mood and Affect: Mood normal.        ASSESSMENT/ PLAN:  TODAY   1. Abscess of skin of abdomen:  Is worse:  Will contact surgeon Will begin doxycycline 100 mg twice daily through 08-30-19 Will begin vicodin 5/325 mg every 6 hours through 08-31-19 Will monitor her status.   MD is aware of resident's narcotic use and is in agreement with current plan of care. We will attempt to wean resident as appropriate.  Ok Edwards NP [facility]  Contact [phone] Monday through Friday 8am- 5pm  After hours call [phone]

## 2019-08-29 DIAGNOSIS — L02211 Cutaneous abscess of abdominal wall: Secondary | ICD-10-CM | POA: Insufficient documentation

## 2019-08-31 ENCOUNTER — Encounter (HOSPITAL_COMMUNITY)
Admission: RE | Admit: 2019-08-31 | Discharge: 2019-08-31 | Disposition: A | Discharge status: Home / Self Care | Payer: Medicare Other | Source: Skilled Nursing Facility | Attending: Internal Medicine | Admitting: Internal Medicine

## 2019-08-31 DIAGNOSIS — E538 Deficiency of other specified B group vitamins: Secondary | ICD-10-CM | POA: Insufficient documentation

## 2019-09-15 ENCOUNTER — Encounter: Payer: Self-pay | Admitting: Adult Health

## 2019-09-15 ENCOUNTER — Non-Acute Institutional Stay (SKILLED_NURSING_FACILITY): Payer: Medicare Other | Admitting: Adult Health

## 2019-09-15 DIAGNOSIS — C50911 Malignant neoplasm of unspecified site of right female breast: Secondary | ICD-10-CM | POA: Diagnosis not present

## 2019-09-15 DIAGNOSIS — K8001 Calculus of gallbladder with acute cholecystitis with obstruction: Secondary | ICD-10-CM | POA: Diagnosis not present

## 2019-09-15 DIAGNOSIS — F0150 Vascular dementia without behavioral disturbance: Secondary | ICD-10-CM

## 2019-09-15 DIAGNOSIS — K5909 Other constipation: Secondary | ICD-10-CM

## 2019-09-15 NOTE — Progress Notes (Signed)
Location:    [place] Room Number: 141/D Place of Service:  SNF (31)   CODE STATUS: Full Code  No Known Allergies  Chief Complaint  Patient presents with  . Annual Exam     Annual Exam    HPI:  She is a 85 year old long term resident of this facility being seen for her annual exam. She has not been hospitalized. No falls. Her weight has been stable. Her bili drain is out; she does continue to have drainage present.she has been treated for cellulitis. She denies any pain no changes in her appetite. She continues to be followed for her chronic illnesses including: dementia; hypertension; constipation   Past Medical History:  Diagnosis Date  . Breast cancer ([place])   . Cognitive communication deficit   . Dementia ([place])   . Difficulty in walking(719.7)   . Fall   . Muscle weakness (generalized)   . Urinary tract infection, site not specified   . Vitamin B deficiency     Past Surgical History:  Procedure Laterality Date  . COLON SURGERY    . IR CATHETER TUBE CHANGE  05/07/2017  . IR EXCHANGE BILIARY DRAIN  07/02/2017  . IR EXCHANGE BILIARY DRAIN  07/10/2017  . IR EXCHANGE BILIARY DRAIN  08/28/2017  . IR EXCHANGE BILIARY DRAIN  10/11/2017  . IR EXCHANGE BILIARY DRAIN  10/28/2017  . IR EXCHANGE BILIARY DRAIN  12/23/2017  . IR EXCHANGE BILIARY DRAIN  01/23/2018  . IR EXCHANGE BILIARY DRAIN  02/17/2018  . IR EXCHANGE BILIARY DRAIN  03/20/2018  . IR EXCHANGE BILIARY DRAIN  04/17/2018  . IR EXCHANGE BILIARY DRAIN  05/29/2018  . IR EXCHANGE BILIARY DRAIN  07/24/2018  . IR EXCHANGE BILIARY DRAIN  09/22/2018  . IR EXCHANGE BILIARY DRAIN  11/20/2018  . IR EXCHANGE BILIARY DRAIN  02/11/2019  . IR EXCHANGE BILIARY DRAIN  04/14/2019  . IR EXCHANGE BILIARY DRAIN  06/09/2019  . IR PERC CHOLECYSTOSTOMY  01/27/2017  . IR RADIOLOGIST EVAL & MGMT  03/05/2017  . MASTECTOMY, PARTIAL Right     Social History   Socioeconomic History  . Marital status: Widowed    Spouse name: Not on  file  . Number of children: Not on file  . Years of education: Not on file  . Highest education level: Not on file  Occupational History  . Not on file  Social Needs  . Financial resource strain: Not hard at all  . Food insecurity    Worry: Never true    Inability: Never true  . Transportation needs    Medical: No    Non-medical: No  Tobacco Use  . Smoking status: Never Smoker  . Smokeless tobacco: Never Used  Substance and Sexual Activity  . Alcohol use: No  . Drug use: No  . Sexual activity: Not on file  Lifestyle  . Physical activity    Days per week: 0 days    Minutes per session: 0 min  . Stress: Not at all  Relationships  . Social Herbalist on phone: Never    Gets together: Never    Attends religious service: Never    Active member of club or organization: No    Attends meetings of clubs or organizations: Never    Relationship status: Widowed  . Intimate partner violence    Fear of current or ex partner: No    Emotionally abused: No    Physically abused: No  Forced sexual activity: No  Other Topics Concern  . Not on file  Social History Narrative  . Not on file   Family History  Problem Relation Age of Onset  . Cancer Father        ? primary  . Diabetes Neg Hx   . Heart disease Neg Hx   . Stroke Neg Hx       VITAL SIGNS BP 126/68   Pulse 62   Temp 97.8 F (36.6 C) (Oral)   Resp 18   Ht 5\' 3"  (1.6 m)   Wt 149 lb 3.2 oz (67.7 kg)   SpO2 91%   BMI 26.43 kg/m   Outpatient Encounter Medications as of 09/15/2019  Medication Sig  . acetaminophen (TYLENOL) 325 MG tablet Take 650 mg by mouth every 4 (four) hours as needed.  . calcium carbonate (TUMS) 500 MG chewable tablet Chew 1 tablet by mouth daily.  . Cholecalciferol 1000 units tablet Take 1,000 Units by mouth at bedtime.   . Cyanocobalamin (VITAMIN B-12) 1000 MCG SUBL Place 1 tablet under the tongue at bedtime.  . donepezil (ARICEPT) 10 MG tablet Take 1 tablet by mouth at  bedtime.  . NON FORMULARY Diet type:  NAS  . Nutritional Supplements (NUTRITIONAL SUPPLEMENT PO) Magic Cup at bedtime for supplement  . polyethylene glycol (MIRALAX / GLYCOLAX) packet Take 17 g by mouth daily as needed.   . [DISCONTINUED] diphenhydrAMINE (BENADRYL) 25 mg capsule Take 25 mg by mouth daily as needed (Rash).  . [DISCONTINUED] doxycycline (VIBRA-TABS) 100 MG tablet Take 100 mg by mouth 2 (two) times daily.   No facility-administered encounter medications on file as of 09/15/2019.      SIGNIFICANT DIAGNOSTIC EXAMS   PREVIOUS;   08-26-19: ct of abdomen  1. Interval removal of the previously seen percutaneous cholecystostomy tube. Cholelithiasis with findings concerning for acute on chronic cholecystitis. Clinical correlation is recommended. Ultrasound may provide better evaluation of the gallbladder. 2. Diffuse colonic diverticulosis. No bowel obstruction. 3. Aortic Atherosclerosis   NO NEW EXAMS.   LABS REVIEWED: PREVIOUS:   02-17-19: wbc 6.1; hgb 12.9; hct 41.5; mcv 98.3 plt 269 glucose 92; bun 18; creat 0.91; k+ 4.3; na++ 139; ca 8.8 liver normal albumin 3.5  06-24-19; wbc 6.0; hgb 12.5; hct 40.1; mcv 97.1; plt 255; glucose 96; bun 17; creat 0.87; k+ 4.2; na++ 136; ca 8.6; albumin 3.5; lipase 39 07-09-19: liver normal albumin 3.6 07-21-19: wbc 5.2; hgb 11.9; hct 38.5; mcv 97.0 plt 234; glucose 92; bun 19; creat 0.89; k+ 4.1; na++ 138; ca 8.6 08-26-19: wbc 11.8; hgb 11.3; hct 35.7; mcv 96.5 plt 227; glucose 97; bun 31; creat 1.02; k+ 3.8; na++ 139; ca 8.6; liver normal albumin 2.8    NO NEW LABS.   Review of Systems  Constitutional: Negative for malaise/fatigue.  Respiratory: Negative for cough and shortness of breath.   Cardiovascular: Negative for chest pain, palpitations and leg swelling.  Gastrointestinal: Negative for abdominal pain, constipation and heartburn.  Musculoskeletal: Negative for back pain, joint pain and myalgias.  Skin: Negative.   Neurological:  Negative for dizziness.  Psychiatric/Behavioral: The patient is not nervous/anxious.    Physical Exam Constitutional:      General: She is not in acute distress.    Appearance: She is well-developed. She is not diaphoretic.  HENT:     Mouth/Throat:     Mouth: Mucous membranes are moist.     Pharynx: Oropharynx is clear.  Eyes:     Conjunctiva/sclera: Conjunctivae  normal.  Neck:     Musculoskeletal: Neck supple.     Thyroid: No thyromegaly.  Cardiovascular:     Rate and Rhythm: Normal rate and regular rhythm.     Pulses: Normal pulses.     Heart sounds: Normal heart sounds.  Pulmonary:     Effort: Pulmonary effort is normal. No respiratory distress.     Breath sounds: Normal breath sounds.  Chest:     Comments: History of right partial mastectomy  Abdominal:     General: Bowel sounds are normal. There is no distension.     Palpations: Abdomen is soft.     Tenderness: There is no abdominal tenderness.     Comments: History of right hemi colectomy       Bili drain site with drainage present   Musculoskeletal:     Right lower leg: No edema.     Left lower leg: No edema.     Comments: Trace bilateral lower extremity edema Is able to move all extremities  History of C2 fracture     Lymphadenopathy:     Cervical: No cervical adenopathy.  Skin:    General: Skin is warm and dry.  Neurological:     Mental Status: She is alert. Mental status is at baseline.  Psychiatric:        Mood and Affect: Mood normal.      ASSESSMENT/ PLAN:   TODAY;   1. Dementia without behavioral disturbance unspecified dementia type: no significant change in statu: weight is 149 pounds; will continue aricept 10 mg daily   2. Essential hypertension; is stable b/p 126/68 will monitor  3. Chronic constipation: is stable will continue miralax daily as needed  4. Malignant neoplasm of right female breast unspecified estrogen receptor status unspecified site of breath: has history of right partial  mastectomy will monitor  5. Calculus of gallbladder with acute cholecystitis and obstruction: bili tube out; does have drainage will monitor   Health maintenance is up to date.    MD is aware of resident's narcotic use and is in agreement with current plan of care. We will attempt to wean resident as appropriate.  Ok Edwards NP [facility]  Contact [phone] Monday through Friday 8am- 5pm  After hours call [phone]

## 2019-09-27 ENCOUNTER — Other Ambulatory Visit: Payer: Self-pay | Admitting: Internal Medicine

## 2019-09-27 ENCOUNTER — Other Ambulatory Visit (HOSPITAL_COMMUNITY)
Admission: RE | Admit: 2019-09-27 | Discharge: 2019-09-27 | Disposition: A | Discharge status: Home / Self Care | Payer: Medicare Other | Source: Ambulatory Visit | Attending: Internal Medicine | Admitting: Internal Medicine

## 2019-09-27 DIAGNOSIS — Z20828 Contact with and (suspected) exposure to other viral communicable diseases: Secondary | ICD-10-CM | POA: Diagnosis present

## 2019-09-27 DIAGNOSIS — Z9189 Other specified personal risk factors, not elsewhere classified: Secondary | ICD-10-CM

## 2019-09-29 ENCOUNTER — Encounter: Payer: Self-pay | Admitting: Adult Health

## 2019-09-29 LAB — SARS CORONAVIRUS 2 (TAT 6-24 HRS): SARS Coronavirus 2: NEGATIVE

## 2019-10-03 ENCOUNTER — Other Ambulatory Visit (HOSPITAL_COMMUNITY)
Admission: RE | Admit: 2019-10-03 | Discharge: 2019-10-03 | Disposition: A | Discharge status: Home / Self Care | Payer: Medicare Other | Source: Ambulatory Visit | Attending: Internal Medicine | Admitting: Internal Medicine

## 2019-10-03 ENCOUNTER — Other Ambulatory Visit: Payer: Self-pay | Admitting: Internal Medicine

## 2019-10-03 DIAGNOSIS — Z9189 Other specified personal risk factors, not elsewhere classified: Secondary | ICD-10-CM

## 2019-10-03 DIAGNOSIS — Z20828 Contact with and (suspected) exposure to other viral communicable diseases: Secondary | ICD-10-CM | POA: Diagnosis present

## 2019-10-03 LAB — SARS CORONAVIRUS 2 (TAT 6-24 HRS): SARS Coronavirus 2: NEGATIVE

## 2019-10-08 LAB — NOVEL CORONAVIRUS, NAA (HOSP ORDER, SEND-OUT TO REF LAB; TAT 18-24 HRS): SARS-CoV-2, NAA: NOT DETECTED

## 2019-10-14 ENCOUNTER — Non-Acute Institutional Stay (SKILLED_NURSING_FACILITY): Payer: Medicare Other | Admitting: Adult Health

## 2019-10-14 ENCOUNTER — Encounter: Payer: Self-pay | Admitting: Adult Health

## 2019-10-14 DIAGNOSIS — F0150 Vascular dementia without behavioral disturbance: Secondary | ICD-10-CM | POA: Diagnosis not present

## 2019-10-14 DIAGNOSIS — I10 Essential (primary) hypertension: Secondary | ICD-10-CM | POA: Diagnosis not present

## 2019-10-14 LAB — NOVEL CORONAVIRUS, NAA (HOSP ORDER, SEND-OUT TO REF LAB; TAT 18-24 HRS): SARS-CoV-2, NAA: NOT DETECTED

## 2019-10-14 NOTE — Progress Notes (Signed)
Location:    [place] Room Number: 141/D Place of Service:  SNF (31)   CODE STATUS: Full Code  No Known Allergies  Chief Complaint  Patient presents with  . Acute Visit     COVID Vaccine Permission    HPI:  We have mederna vaccine available. Her risk factors include: advanced age; long term resident of snf; hypertension; dementia. I have spoken with her family regarding 2 injection process; effectiveness; side effects; possible adverse reactions. They have verbalized understanding and are in agreement to receive the vaccine. There are no reports of cough; shortness of breath; no uncontrolled pain; no changes in appetite.    Past Medical History:  Diagnosis Date  . Breast cancer ([place])   . Cognitive communication deficit   . Dementia ([place])   . Difficulty in walking(719.7)   . Fall   . HTN (hypertension) 08/26/2016  . Muscle weakness (generalized)   . Urinary tract infection, site not specified   . Vitamin B deficiency     Past Surgical History:  Procedure Laterality Date  . COLON SURGERY    . IR CATHETER TUBE CHANGE  05/07/2017  . IR EXCHANGE BILIARY DRAIN  07/02/2017  . IR EXCHANGE BILIARY DRAIN  07/10/2017  . IR EXCHANGE BILIARY DRAIN  08/28/2017  . IR EXCHANGE BILIARY DRAIN  10/11/2017  . IR EXCHANGE BILIARY DRAIN  10/28/2017  . IR EXCHANGE BILIARY DRAIN  12/23/2017  . IR EXCHANGE BILIARY DRAIN  01/23/2018  . IR EXCHANGE BILIARY DRAIN  02/17/2018  . IR EXCHANGE BILIARY DRAIN  03/20/2018  . IR EXCHANGE BILIARY DRAIN  04/17/2018  . IR EXCHANGE BILIARY DRAIN  05/29/2018  . IR EXCHANGE BILIARY DRAIN  07/24/2018  . IR EXCHANGE BILIARY DRAIN  09/22/2018  . IR EXCHANGE BILIARY DRAIN  11/20/2018  . IR EXCHANGE BILIARY DRAIN  02/11/2019  . IR EXCHANGE BILIARY DRAIN  04/14/2019  . IR EXCHANGE BILIARY DRAIN  06/09/2019  . IR PERC CHOLECYSTOSTOMY  01/27/2017  . IR RADIOLOGIST EVAL & MGMT  03/05/2017  . MASTECTOMY, PARTIAL Right     Social History   Socioeconomic  History  . Marital status: Widowed    Spouse name: Not on file  . Number of children: Not on file  . Years of education: Not on file  . Highest education level: Not on file  Occupational History  . Not on file  Tobacco Use  . Smoking status: Never Smoker  . Smokeless tobacco: Never Used  Substance and Sexual Activity  . Alcohol use: No  . Drug use: No  . Sexual activity: Not on file  Other Topics Concern  . Not on file  Social History Narrative  . Not on file   Social Determinants of Health   Financial Resource Strain:   . Difficulty of Paying Living Expenses: Not on file  Food Insecurity:   . Worried About Charity fundraiser in the Last Year: Not on file  . Ran Out of Food in the Last Year: Not on file  Transportation Needs:   . Lack of Transportation (Medical): Not on file  . Lack of Transportation (Non-Medical): Not on file  Physical Activity:   . Days of Exercise per Week: Not on file  . Minutes of Exercise per Session: Not on file  Stress:   . Feeling of Stress : Not on file  Social Connections:   . Frequency of Communication with Friends and Family: Not on file  . Frequency of Social  Gatherings with Friends and Family: Not on file  . Attends Religious Services: Not on file  . Active Member of Clubs or Organizations: Not on file  . Attends Archivist Meetings: Not on file  . Marital Status: Not on file  Intimate Partner Violence:   . Fear of Current or Ex-Partner: Not on file  . Emotionally Abused: Not on file  . Physically Abused: Not on file  . Sexually Abused: Not on file   Family History  Problem Relation Age of Onset  . Cancer Father        ? primary  . Diabetes Neg Hx   . Heart disease Neg Hx   . Stroke Neg Hx       VITAL SIGNS BP 126/68   Pulse 62   Temp 98.3 F (36.8 C) (Oral)   Resp 18   Ht 5\' 3"  (1.6 m)   Wt 144 lb 12.8 oz (65.7 kg)   SpO2 91%   BMI 25.65 kg/m   Outpatient Encounter Medications as of 10/14/2019    Medication Sig  . acetaminophen (TYLENOL) 325 MG tablet Take 650 mg by mouth every 4 (four) hours as needed.  Marland Kitchen ascorbic acid (VITAMIN C) 500 MG tablet Take 500 mg by mouth daily.  . calcium carbonate (TUMS) 500 MG chewable tablet Chew 1 tablet by mouth daily.  . Cholecalciferol 1000 units tablet Take 1,000 Units by mouth at bedtime.   . Cyanocobalamin (VITAMIN B-12) 1000 MCG SUBL Place 1 tablet under the tongue at bedtime.  . donepezil (ARICEPT) 10 MG tablet Take 1 tablet by mouth at bedtime.  . NON FORMULARY Diet type:  NAS  . Nutritional Supplements (NUTRITIONAL SUPPLEMENT PO) Magic Cup at bedtime for supplement  . polyethylene glycol (MIRALAX / GLYCOLAX) packet Take 17 g by mouth daily as needed.   . zinc sulfate 220 (50 Zn) MG capsule Take 220 mg by mouth daily.   No facility-administered encounter medications on file as of 10/14/2019.     SIGNIFICANT DIAGNOSTIC EXAMS   PREVIOUS;   08-26-19: ct of abdomen  1. Interval removal of the previously seen percutaneous cholecystostomy tube. Cholelithiasis with findings concerning for acute on chronic cholecystitis. Clinical correlation is recommended. Ultrasound may provide better evaluation of the gallbladder. 2. Diffuse colonic diverticulosis. No bowel obstruction. 3. Aortic Atherosclerosis   NO NEW EXAMS.   LABS REVIEWED: PREVIOUS:   02-17-19: wbc 6.1; hgb 12.9; hct 41.5; mcv 98.3 plt 269 glucose 92; bun 18; creat 0.91; k+ 4.3; na++ 139; ca 8.8 liver normal albumin 3.5  06-24-19; wbc 6.0; hgb 12.5; hct 40.1; mcv 97.1; plt 255; glucose 96; bun 17; creat 0.87; k+ 4.2; na++ 136; ca 8.6; albumin 3.5; lipase 39 07-09-19: liver normal albumin 3.6 07-21-19: wbc 5.2; hgb 11.9; hct 38.5; mcv 97.0 plt 234; glucose 92; bun 19; creat 0.89; k+ 4.1; na++ 138; ca 8.6 08-26-19: wbc 11.8; hgb 11.3; hct 35.7; mcv 96.5 plt 227; glucose 97; bun 31; creat 1.02; k+ 3.8; na++ 139; ca 8.6; liver normal albumin 2.8    NO NEW LABS.   Review of Systems   Constitutional: Negative for malaise/fatigue.  Respiratory: Negative for cough and shortness of breath.   Cardiovascular: Negative for chest pain, palpitations and leg swelling.  Gastrointestinal: Negative for abdominal pain, constipation and heartburn.  Musculoskeletal: Negative for back pain, joint pain and myalgias.  Skin: Negative.   Neurological: Negative for dizziness.  Psychiatric/Behavioral: The patient is not nervous/anxious.    Physical Exam Constitutional:  General: She is not in acute distress.    Appearance: She is well-developed. She is not diaphoretic.  Neck:     Thyroid: No thyromegaly.  Cardiovascular:     Rate and Rhythm: Normal rate and regular rhythm.     Pulses: Normal pulses.     Heart sounds: Normal heart sounds.  Pulmonary:     Effort: Pulmonary effort is normal. No respiratory distress.     Breath sounds: Normal breath sounds.  Chest:     Comments: History of right partial mastectomy  Abdominal:     General: Bowel sounds are normal. There is no distension.     Palpations: Abdomen is soft.     Tenderness: There is no abdominal tenderness.     Comments: History of right hemi colectomy       Bili drain site with drainage present    Musculoskeletal:     Cervical back: Neck supple.     Right lower leg: No edema.     Left lower leg: No edema.     Comments: Trace bilateral lower extremity edema Is able to move all extremities  History of C2 fracture      Lymphadenopathy:     Cervical: No cervical adenopathy.  Skin:    General: Skin is warm and dry.  Neurological:     Mental Status: She is alert. Mental status is at baseline.  Psychiatric:        Mood and Affect: Mood normal.     ASSESSMENT/ PLAN:  TODAY;   1. Advanced age 47. Long term resident of snf 3. Essential hypertension 4. Vascular dementia without behavioral disturbance  Will setup for the first injection on 10-20-19.    MD is aware of resident's narcotic use and is in  agreement with current plan of care. We will attempt to wean resident as appropriate.  Ok Edwards NP [facility]  Contact [phone] Monday through Friday 8am- 5pm  After hours call [phone]

## 2019-10-18 ENCOUNTER — Other Ambulatory Visit (HOSPITAL_COMMUNITY)
Admission: RE | Admit: 2019-10-18 | Discharge: 2019-10-18 | Disposition: A | Discharge status: Home / Self Care | Payer: Medicare Other | Source: Ambulatory Visit | Attending: Adult Health | Admitting: Adult Health

## 2019-10-18 DIAGNOSIS — Z20828 Contact with and (suspected) exposure to other viral communicable diseases: Secondary | ICD-10-CM | POA: Insufficient documentation

## 2019-10-19 LAB — NOVEL CORONAVIRUS, NAA (HOSP ORDER, SEND-OUT TO REF LAB; TAT 18-24 HRS): SARS-CoV-2, NAA: NOT DETECTED

## 2019-10-20 ENCOUNTER — Encounter: Payer: Self-pay | Admitting: Adult Health

## 2019-10-20 ENCOUNTER — Non-Acute Institutional Stay (SKILLED_NURSING_FACILITY): Payer: Medicare Other | Admitting: Adult Health

## 2019-10-20 DIAGNOSIS — I10 Essential (primary) hypertension: Secondary | ICD-10-CM

## 2019-10-20 DIAGNOSIS — F0150 Vascular dementia without behavioral disturbance: Secondary | ICD-10-CM | POA: Diagnosis not present

## 2019-10-20 DIAGNOSIS — K5909 Other constipation: Secondary | ICD-10-CM

## 2019-10-20 NOTE — Progress Notes (Signed)
Location:    [place] Room Number: 141/D Place of Service:  SNF (31)   CODE STATUS: Full Code  No Known Allergies  Chief Complaint  Patient presents with  . Medical Management of Chronic Issues        Dementia without behavioral disturbance unspecified dementia type:    Essential hypertension; . Chronic constipation:     HPI:  She is a 85 year old long term resident of this facility being seen for the management of her chronic illnesses: dementia; hypertension; constipation. She denies any problems with constipation; no uncontrolled pain; no changes in appetite.   Past Medical History:  Diagnosis Date  . Breast cancer ([place])   . Cognitive communication deficit   . Dementia ([place])   . Difficulty in walking(719.7)   . Fall   . HTN (hypertension) 08/26/2016  . Muscle weakness (generalized)   . Urinary tract infection, site not specified   . Vitamin B deficiency     Past Surgical History:  Procedure Laterality Date  . COLON SURGERY    . IR CATHETER TUBE CHANGE  05/07/2017  . IR EXCHANGE BILIARY DRAIN  07/02/2017  . IR EXCHANGE BILIARY DRAIN  07/10/2017  . IR EXCHANGE BILIARY DRAIN  08/28/2017  . IR EXCHANGE BILIARY DRAIN  10/11/2017  . IR EXCHANGE BILIARY DRAIN  10/28/2017  . IR EXCHANGE BILIARY DRAIN  12/23/2017  . IR EXCHANGE BILIARY DRAIN  01/23/2018  . IR EXCHANGE BILIARY DRAIN  02/17/2018  . IR EXCHANGE BILIARY DRAIN  03/20/2018  . IR EXCHANGE BILIARY DRAIN  04/17/2018  . IR EXCHANGE BILIARY DRAIN  05/29/2018  . IR EXCHANGE BILIARY DRAIN  07/24/2018  . IR EXCHANGE BILIARY DRAIN  09/22/2018  . IR EXCHANGE BILIARY DRAIN  11/20/2018  . IR EXCHANGE BILIARY DRAIN  02/11/2019  . IR EXCHANGE BILIARY DRAIN  04/14/2019  . IR EXCHANGE BILIARY DRAIN  06/09/2019  . IR PERC CHOLECYSTOSTOMY  01/27/2017  . IR RADIOLOGIST EVAL & MGMT  03/05/2017  . MASTECTOMY, PARTIAL Right     Social History   Socioeconomic History  . Marital status: Widowed    Spouse name: Not on file    . Number of children: Not on file  . Years of education: Not on file  . Highest education level: Not on file  Occupational History  . Not on file  Tobacco Use  . Smoking status: Never Smoker  . Smokeless tobacco: Never Used  Substance and Sexual Activity  . Alcohol use: No  . Drug use: No  . Sexual activity: Not on file  Other Topics Concern  . Not on file  Social History Narrative  . Not on file   Social Determinants of Health   Financial Resource Strain:   . Difficulty of Paying Living Expenses: Not on file  Food Insecurity:   . Worried About Charity fundraiser in the Last Year: Not on file  . Ran Out of Food in the Last Year: Not on file  Transportation Needs:   . Lack of Transportation (Medical): Not on file  . Lack of Transportation (Non-Medical): Not on file  Physical Activity:   . Days of Exercise per Week: Not on file  . Minutes of Exercise per Session: Not on file  Stress:   . Feeling of Stress : Not on file  Social Connections:   . Frequency of Communication with Friends and Family: Not on file  . Frequency of Social Gatherings with Friends and Family: Not  on file  . Attends Religious Services: Not on file  . Active Member of Clubs or Organizations: Not on file  . Attends Archivist Meetings: Not on file  . Marital Status: Not on file  Intimate Partner Violence:   . Fear of Current or Ex-Partner: Not on file  . Emotionally Abused: Not on file  . Physically Abused: Not on file  . Sexually Abused: Not on file   Family History  Problem Relation Age of Onset  . Cancer Father        ? primary  . Diabetes Neg Hx   . Heart disease Neg Hx   . Stroke Neg Hx       VITAL SIGNS BP 126/68   Pulse 62   Temp 98.3 F (36.8 C) (Oral)   Resp 18   Ht 5\' 3"  (1.6 m)   Wt 144 lb 12.8 oz (65.7 kg)   SpO2 91%   BMI 25.65 kg/m   Outpatient Encounter Medications as of 10/20/2019  Medication Sig  . acetaminophen (TYLENOL) 325 MG tablet Take 650 mg by  mouth every 4 (four) hours as needed.  . calcium carbonate (TUMS) 500 MG chewable tablet Chew 1 tablet by mouth daily.  . Cholecalciferol 1000 units tablet Take 1,000 Units by mouth at bedtime.   . Cyanocobalamin (VITAMIN B-12) 1000 MCG SUBL Place 1 tablet under the tongue at bedtime.  . donepezil (ARICEPT) 10 MG tablet Take 1 tablet by mouth at bedtime.  . NON FORMULARY Diet type:  NAS  . Nutritional Supplements (NUTRITIONAL SUPPLEMENT PO) Magic Cup at bedtime for supplement  . polyethylene glycol (MIRALAX / GLYCOLAX) packet Take 17 g by mouth daily as needed.    No facility-administered encounter medications on file as of 10/20/2019.     SIGNIFICANT DIAGNOSTIC EXAMS   PREVIOUS;   08-26-19: ct of abdomen  1. Interval removal of the previously seen percutaneous cholecystostomy tube. Cholelithiasis with findings concerning for acute on chronic cholecystitis. Clinical correlation is recommended. Ultrasound may provide better evaluation of the gallbladder. 2. Diffuse colonic diverticulosis. No bowel obstruction. 3. Aortic Atherosclerosis   NO NEW EXAMS.   LABS REVIEWED: PREVIOUS:   02-17-19: wbc 6.1; hgb 12.9; hct 41.5; mcv 98.3 plt 269 glucose 92; bun 18; creat 0.91; k+ 4.3; na++ 139; ca 8.8 liver normal albumin 3.5  06-24-19; wbc 6.0; hgb 12.5; hct 40.1; mcv 97.1; plt 255; glucose 96; bun 17; creat 0.87; k+ 4.2; na++ 136; ca 8.6; albumin 3.5; lipase 39 07-09-19: liver normal albumin 3.6 07-21-19: wbc 5.2; hgb 11.9; hct 38.5; mcv 97.0 plt 234; glucose 92; bun 19; creat 0.89; k+ 4.1; na++ 138; ca 8.6 08-26-19: wbc 11.8; hgb 11.3; hct 35.7; mcv 96.5 plt 227; glucose 97; bun 31; creat 1.02; k+ 3.8; na++ 139; ca 8.6; liver normal albumin 2.8    NO NEW LABS.   Review of Systems  Constitutional: Negative for malaise/fatigue.  Respiratory: Negative for cough and shortness of breath.   Cardiovascular: Negative for chest pain, palpitations and leg swelling.  Gastrointestinal: Negative for  abdominal pain, constipation and heartburn.  Musculoskeletal: Negative for back pain, joint pain and myalgias.  Skin: Negative.   Neurological: Negative for dizziness.  Psychiatric/Behavioral: The patient is not nervous/anxious.    Physical Exam Constitutional:      General: She is not in acute distress.    Appearance: She is well-developed. She is not diaphoretic.  Neck:     Thyroid: No thyromegaly.  Cardiovascular:  Rate and Rhythm: Normal rate and regular rhythm.     Pulses: Normal pulses.     Heart sounds: Normal heart sounds.  Pulmonary:     Effort: Pulmonary effort is normal. No respiratory distress.     Breath sounds: Normal breath sounds.  Chest:     Comments: History of right partial mastectomy  Abdominal:     General: Bowel sounds are normal. There is no distension.     Palpations: Abdomen is soft.     Tenderness: There is no abdominal tenderness.     Comments:  History of right hemi colectomy       Bili drain site with drainage present     Musculoskeletal:     Cervical back: Neck supple.     Right lower leg: No edema.     Left lower leg: No edema.     Comments: Trace bilateral lower extremity edema Is able to move all extremities  History of C2 fracture  Lymphadenopathy:     Cervical: No cervical adenopathy.  Skin:    General: Skin is warm and dry.  Neurological:     Mental Status: She is alert. Mental status is at baseline.  Psychiatric:        Mood and Affect: Mood normal.       ASSESSMENT/ PLAN:   TODAY;   1. Dementia without behavioral disturbance unspecified dementia type: no significant change in statu: weight is 144 pounds; will continue aricept 10 mg daily   2. Essential hypertension; is stable b/p 126/68 will monitor  3. Chronic constipation: is stable will continue miralax daily as needed  PREVIOUS   4. Malignant neoplasm of right female breast unspecified estrogen receptor status unspecified site of breath: has history of right  partial mastectomy will monitor  5. Calculus of gallbladder with acute cholecystitis and obstruction: bili tube out; does have drainage will monitor    MD is aware of resident's narcotic use and is in agreement with current plan of care. We will attempt to wean resident as appropriate.  Ok Edwards NP [facility]  Contact [phone] Monday through Friday 8am- 5pm  After hours call [phone]

## 2019-10-28 DIAGNOSIS — Z1159 Encounter for screening for other viral diseases: Secondary | ICD-10-CM | POA: Diagnosis not present

## 2019-10-29 ENCOUNTER — Other Ambulatory Visit (HOSPITAL_COMMUNITY)
Admission: RE | Admit: 2019-10-29 | Discharge: 2019-10-29 | Disposition: A | Discharge status: Home / Self Care | Payer: Medicare Other | Source: Skilled Nursing Facility | Attending: Adult Health | Admitting: Adult Health

## 2019-10-29 ENCOUNTER — Non-Acute Institutional Stay (SKILLED_NURSING_FACILITY): Payer: Medicare Other | Admitting: Adult Health

## 2019-10-29 ENCOUNTER — Other Ambulatory Visit: Payer: Self-pay | Admitting: Critical Care Medicine

## 2019-10-29 ENCOUNTER — Encounter: Payer: Self-pay | Admitting: Adult Health

## 2019-10-29 DIAGNOSIS — F0150 Vascular dementia without behavioral disturbance: Secondary | ICD-10-CM

## 2019-10-29 DIAGNOSIS — U071 COVID-19: Secondary | ICD-10-CM | POA: Diagnosis present

## 2019-10-29 DIAGNOSIS — Z20822 Contact with and (suspected) exposure to covid-19: Secondary | ICD-10-CM | POA: Diagnosis not present

## 2019-10-29 DIAGNOSIS — I10 Essential (primary) hypertension: Secondary | ICD-10-CM

## 2019-10-29 DIAGNOSIS — R05 Cough: Secondary | ICD-10-CM | POA: Diagnosis not present

## 2019-10-29 LAB — CBC WITH DIFFERENTIAL/PLATELET
Abs Immature Granulocytes: 0.02 10*3/uL (ref 0.00–0.07)
Basophils Absolute: 0 10*3/uL (ref 0.0–0.1)
Basophils Relative: 0 %
Eosinophils Absolute: 0.2 10*3/uL (ref 0.0–0.5)
Eosinophils Relative: 3 %
HCT: 38.4 % (ref 36.0–46.0)
Hemoglobin: 12.1 g/dL (ref 12.0–15.0)
Immature Granulocytes: 0 %
Lymphocytes Relative: 19 %
Lymphs Abs: 1.1 10*3/uL (ref 0.7–4.0)
MCH: 30.9 pg (ref 26.0–34.0)
MCHC: 31.5 g/dL (ref 30.0–36.0)
MCV: 98.2 fL (ref 80.0–100.0)
Monocytes Absolute: 0.5 10*3/uL (ref 0.1–1.0)
Monocytes Relative: 9 %
Neutro Abs: 3.9 10*3/uL (ref 1.7–7.7)
Neutrophils Relative %: 69 %
Platelets: 213 10*3/uL (ref 150–400)
RBC: 3.91 MIL/uL (ref 3.87–5.11)
RDW: 15.8 % — ABNORMAL HIGH (ref 11.5–15.5)
WBC: 5.6 10*3/uL (ref 4.0–10.5)
nRBC: 0 % (ref 0.0–0.2)

## 2019-10-29 LAB — BASIC METABOLIC PANEL
Anion gap: 7 (ref 5–15)
BUN: 20 mg/dL (ref 8–23)
CO2: 25 mmol/L (ref 22–32)
Calcium: 8.7 mg/dL — ABNORMAL LOW (ref 8.9–10.3)
Chloride: 106 mmol/L (ref 98–111)
Creatinine, Ser: 0.9 mg/dL (ref 0.44–1.00)
GFR calc Af Amer: >60 mL/min (ref >60)
GFR calc non Af Amer: 56 mL/min — ABNORMAL LOW (ref >60)
Glucose, Bld: 146 mg/dL — ABNORMAL HIGH (ref 70–99)
Potassium: 4.1 mmol/L (ref 3.5–5.1)
Sodium: 138 mmol/L (ref 135–145)

## 2019-10-29 LAB — D-DIMER, QUANTITATIVE: D-Dimer, Quant: 0.72 ug/mL-FEU — ABNORMAL HIGH (ref 0.00–0.50)

## 2019-10-29 LAB — FERRITIN: Ferritin: 32 ng/mL (ref 11–307)

## 2019-10-29 NOTE — Progress Notes (Signed)
Location:    [place] Room Number: 141/D Place of Service:  SNF (31)   CODE STATUS: Full Code  No Known Allergies  Chief Complaint  Patient presents with  . Follow-up    Follow Up Covid    HPI:  She has been diagnosed with covid 19. There are no reports of fevers; no reports of changes in appetite; no cough or shortness of breath. I have sent her information to Dr. Joya Gaskins for possible mAb. I have spoken with her family who would like for her to have this therapy.    Past Medical History:  Diagnosis Date  . Breast cancer ([place])   . Cognitive communication deficit   . Dementia ([place])   . Difficulty in walking(719.7)   . Fall   . HTN (hypertension) 08/26/2016  . Muscle weakness (generalized)   . Urinary tract infection, site not specified   . Vitamin B deficiency     Past Surgical History:  Procedure Laterality Date  . COLON SURGERY    . IR CATHETER TUBE CHANGE  05/07/2017  . IR EXCHANGE BILIARY DRAIN  07/02/2017  . IR EXCHANGE BILIARY DRAIN  07/10/2017  . IR EXCHANGE BILIARY DRAIN  08/28/2017  . IR EXCHANGE BILIARY DRAIN  10/11/2017  . IR EXCHANGE BILIARY DRAIN  10/28/2017  . IR EXCHANGE BILIARY DRAIN  12/23/2017  . IR EXCHANGE BILIARY DRAIN  01/23/2018  . IR EXCHANGE BILIARY DRAIN  02/17/2018  . IR EXCHANGE BILIARY DRAIN  03/20/2018  . IR EXCHANGE BILIARY DRAIN  04/17/2018  . IR EXCHANGE BILIARY DRAIN  05/29/2018  . IR EXCHANGE BILIARY DRAIN  07/24/2018  . IR EXCHANGE BILIARY DRAIN  09/22/2018  . IR EXCHANGE BILIARY DRAIN  11/20/2018  . IR EXCHANGE BILIARY DRAIN  02/11/2019  . IR EXCHANGE BILIARY DRAIN  04/14/2019  . IR EXCHANGE BILIARY DRAIN  06/09/2019  . IR PERC CHOLECYSTOSTOMY  01/27/2017  . IR RADIOLOGIST EVAL & MGMT  03/05/2017  . MASTECTOMY, PARTIAL Right     Social History   Socioeconomic History  . Marital status: Widowed    Spouse name: Not on file  . Number of children: Not on file  . Years of education: Not on file  . Highest education  level: Not on file  Occupational History  . Not on file  Tobacco Use  . Smoking status: Never Smoker  . Smokeless tobacco: Never Used  Substance and Sexual Activity  . Alcohol use: No  . Drug use: No  . Sexual activity: Not on file  Other Topics Concern  . Not on file  Social History Narrative  . Not on file   Social Determinants of Health   Financial Resource Strain:   . Difficulty of Paying Living Expenses: Not on file  Food Insecurity:   . Worried About Charity fundraiser in the Last Year: Not on file  . Ran Out of Food in the Last Year: Not on file  Transportation Needs:   . Lack of Transportation (Medical): Not on file  . Lack of Transportation (Non-Medical): Not on file  Physical Activity:   . Days of Exercise per Week: Not on file  . Minutes of Exercise per Session: Not on file  Stress:   . Feeling of Stress : Not on file  Social Connections:   . Frequency of Communication with Friends and Family: Not on file  . Frequency of Social Gatherings with Friends and Family: Not on file  . Attends Religious Services:  Not on file  . Active Member of Clubs or Organizations: Not on file  . Attends Archivist Meetings: Not on file  . Marital Status: Not on file  Intimate Partner Violence:   . Fear of Current or Ex-Partner: Not on file  . Emotionally Abused: Not on file  . Physically Abused: Not on file  . Sexually Abused: Not on file   Family History  Problem Relation Age of Onset  . Cancer Father        ? primary  . Diabetes Neg Hx   . Heart disease Neg Hx   . Stroke Neg Hx       VITAL SIGNS BP 126/68   Pulse 62   Temp 98.3 F (36.8 C) (Oral)   Resp 18   Ht 5\' 3"  (1.6 m)   Wt 147 lb 12.8 oz (67 kg)   SpO2 91%   BMI 26.18 kg/m   Outpatient Encounter Medications as of 10/29/2019  Medication Sig  . acetaminophen (TYLENOL) 325 MG tablet Take 650 mg by mouth every 4 (four) hours as needed.  . calcium carbonate (TUMS) 500 MG chewable tablet Chew 1  tablet by mouth daily.  . Cholecalciferol 1000 units tablet Take 1,000 Units by mouth at bedtime.   . Cyanocobalamin (VITAMIN B-12) 1000 MCG SUBL Place 1 tablet under the tongue at bedtime.  . donepezil (ARICEPT) 10 MG tablet Take 1 tablet by mouth at bedtime.  . NON FORMULARY Diet type:  NAS  . Nutritional Supplements (NUTRITIONAL SUPPLEMENT PO) Magic Cup at bedtime for supplement  . polyethylene glycol (MIRALAX / GLYCOLAX) packet Take 17 g by mouth daily as needed.    No facility-administered encounter medications on file as of 10/29/2019.     SIGNIFICANT DIAGNOSTIC EXAMS  PREVIOUS;   08-26-19: ct of abdomen  1. Interval removal of the previously seen percutaneous cholecystostomy tube. Cholelithiasis with findings concerning for acute on chronic cholecystitis. Clinical correlation is recommended. Ultrasound may provide better evaluation of the gallbladder. 2. Diffuse colonic diverticulosis. No bowel obstruction. 3. Aortic Atherosclerosis   NO NEW EXAMS.   LABS REVIEWED: PREVIOUS:   02-17-19: wbc 6.1; hgb 12.9; hct 41.5; mcv 98.3 plt 269 glucose 92; bun 18; creat 0.91; k+ 4.3; na++ 139; ca 8.8 liver normal albumin 3.5  06-24-19; wbc 6.0; hgb 12.5; hct 40.1; mcv 97.1; plt 255; glucose 96; bun 17; creat 0.87; k+ 4.2; na++ 136; ca 8.6; albumin 3.5; lipase 39 07-09-19: liver normal albumin 3.6 07-21-19: wbc 5.2; hgb 11.9; hct 38.5; mcv 97.0 plt 234; glucose 92; bun 19; creat 0.89; k+ 4.1; na++ 138; ca 8.6 08-26-19: wbc 11.8; hgb 11.3; hct 35.7; mcv 96.5 plt 227; glucose 97; bun 31; creat 1.02; k+ 3.8; na++ 139; ca 8.6; liver normal albumin 2.8    NO NEW LABS.   Review of Systems  Constitutional: Negative for malaise/fatigue.  Respiratory: Negative for cough and shortness of breath.   Cardiovascular: Negative for chest pain, palpitations and leg swelling.  Gastrointestinal: Negative for abdominal pain, constipation and heartburn.  Musculoskeletal: Negative for back pain, joint pain and  myalgias.  Skin: Negative.   Neurological: Negative for dizziness.  Psychiatric/Behavioral: The patient is not nervous/anxious.       Physical Exam Constitutional:      General: She is not in acute distress.    Appearance: She is well-developed. She is not diaphoretic.  Neck:     Thyroid: No thyromegaly.  Cardiovascular:     Rate and Rhythm: Normal rate  and regular rhythm.     Pulses: Normal pulses.     Heart sounds: Normal heart sounds.  Pulmonary:     Effort: Pulmonary effort is normal. No respiratory distress.     Breath sounds: Normal breath sounds.  Chest:     Comments: History of right partial mastectomy  Abdominal:     General: Bowel sounds are normal. There is no distension.     Palpations: Abdomen is soft.     Tenderness: There is no abdominal tenderness.     Comments: History of right hemi colectomy       Bili drain site with drainage present      Musculoskeletal:     Cervical back: Neck supple.     Right lower leg: Edema present.     Left lower leg: Edema present.     Comments: Trace bilateral lower extremity edema Is able to move all extremities  History of C2 fracture   Lymphadenopathy:     Cervical: No cervical adenopathy.  Skin:    General: Skin is warm and dry.  Neurological:     Mental Status: She is alert. Mental status is at baseline.  Psychiatric:        Mood and Affect: Mood normal.      ASSESSMENT/ PLAN:  TODAY  1. covid 19 infection: will get cbc; bmp d-dimer ferritin; chest x-ray will monitor her status; will setup for mAb.    MD is aware of resident's narcotic use and is in agreement with current plan of care. We will attempt to wean resident as appropriate.  Ok Edwards NP [facility]  Contact [phone] Monday through Friday 8am- 5pm  After hours call [phone]

## 2019-10-29 NOTE — Progress Notes (Signed)
  I connected by phone with Cynthia [name] on 10/29/2019 at 2:15 PM to discuss the potential use of an new treatment for mild to moderate COVID-19 viral infection in non-hospitalized patients.  This patient is a 86 y.o. female that meets the FDA criteria for Emergency Use Authorization of bamlanivimab or casirivimab\imdevimab.  Has a (+) direct SARS-CoV-2 viral test result  Has mild or moderate COVID-19   Is ? 86 years of age and weighs ? 40 kg  Is NOT hospitalized due to COVID-19  Is NOT requiring oxygen therapy or requiring an increase in baseline oxygen flow rate due to COVID-19  Is within 10 days of symptom onset  Has at least one of the high risk factor(s) for progression to severe COVID-19 and/or hospitalization as defined in EUA.  Specific high risk criteria : >/= 84 yo, cardio vascular disease, HTN   I have spoken and communicated the following to the patient or parent/caregiver:  1. FDA has authorized the emergency use of bamlanivimab and casirivimab\imdevimab for the treatment of mild to moderate COVID-19 in adults and pediatric patients with positive results of direct SARS-CoV-2 viral testing who are 63 years of age and older weighing at least 40 kg, and who are at high risk for progressing to severe COVID-19 and/or hospitalization.  2. The significant known and potential risks and benefits of bamlanivimab and casirivimab\imdevimab, and the extent to which such potential risks and benefits are unknown.  3. Information on available alternative treatments and the risks and benefits of those alternatives, including clinical trials.  4. Patients treated with bamlanivimab and casirivimab\imdevimab should continue to self-isolate and use infection control measures (e.g., wear mask, isolate, social distance, avoid sharing personal items, clean and disinfect "high touch" surfaces, and frequent handwashing) according to CDC guidelines.   5. The patient or parent/caregiver has the  option to accept or refuse bamlanivimab or casirivimab\imdevimab .  After reviewing this information with the patient, The patient agreed to proceed with receiving the bamlanimivab infusion and will be provided a copy of the Fact sheet prior to receiving the infusion.Asencion Noble 10/29/2019 2:15 PM

## 2019-10-30 ENCOUNTER — Ambulatory Visit (HOSPITAL_COMMUNITY): Admit: 2019-10-30 | Payer: Medicare Other

## 2019-11-02 ENCOUNTER — Ambulatory Visit (HOSPITAL_COMMUNITY)
Admit: 2019-11-02 | Discharge: 2019-11-02 | Disposition: A | Discharge status: Home / Self Care | Payer: Medicare Other | Attending: Critical Care Medicine | Admitting: Critical Care Medicine

## 2019-11-02 DIAGNOSIS — U071 COVID-19: Secondary | ICD-10-CM | POA: Insufficient documentation

## 2019-11-04 ENCOUNTER — Non-Acute Institutional Stay (SKILLED_NURSING_FACILITY): Payer: Medicare Other | Admitting: Internal Medicine

## 2019-11-04 DIAGNOSIS — Z95828 Presence of other vascular implants and grafts: Secondary | ICD-10-CM

## 2019-11-04 DIAGNOSIS — S40021A Contusion of right upper arm, initial encounter: Secondary | ICD-10-CM

## 2019-11-11 ENCOUNTER — Encounter: Payer: Self-pay | Admitting: Adult Health

## 2019-11-11 ENCOUNTER — Non-Acute Institutional Stay (SKILLED_NURSING_FACILITY): Payer: Medicare Other | Admitting: Adult Health

## 2019-11-11 DIAGNOSIS — F0391 Unspecified dementia with behavioral disturbance: Secondary | ICD-10-CM | POA: Diagnosis not present

## 2019-11-11 DIAGNOSIS — S12191D Other nondisplaced fracture of second cervical vertebra, subsequent encounter for fracture with routine healing: Secondary | ICD-10-CM

## 2019-11-11 DIAGNOSIS — I10 Essential (primary) hypertension: Secondary | ICD-10-CM

## 2019-11-11 NOTE — Progress Notes (Signed)
Location:    [facility] Room Number: 157D Place of Service:  SNF (31) Phillips Grout NP    CODE STATUS: FULL  No Known Allergies  Chief Complaint  Patient presents with  . Acute Visit    Care Plan    HPI:  We have come together for her care plan meeting. BIMS 6/15 mood 0/30. No reports of falls. Her weight is stable. No reports of uncontrolled pain. She continues to be followed for her chronic illnesses including: dementia; hypertension; c2 spine fracture.    Past Medical History:  Diagnosis Date  . Breast cancer ([place])   . Cognitive communication deficit   . Dementia ([place])   . Difficulty in walking(719.7)   . Fall   . HTN (hypertension) 08/26/2016  . Muscle weakness (generalized)   . Urinary tract infection, site not specified   . Vitamin B deficiency     Past Surgical History:  Procedure Laterality Date  . COLON SURGERY    . IR CATHETER TUBE CHANGE  05/07/2017  . IR EXCHANGE BILIARY DRAIN  07/02/2017  . IR EXCHANGE BILIARY DRAIN  07/10/2017  . IR EXCHANGE BILIARY DRAIN  08/28/2017  . IR EXCHANGE BILIARY DRAIN  10/11/2017  . IR EXCHANGE BILIARY DRAIN  10/28/2017  . IR EXCHANGE BILIARY DRAIN  12/23/2017  . IR EXCHANGE BILIARY DRAIN  01/23/2018  . IR EXCHANGE BILIARY DRAIN  02/17/2018  . IR EXCHANGE BILIARY DRAIN  03/20/2018  . IR EXCHANGE BILIARY DRAIN  04/17/2018  . IR EXCHANGE BILIARY DRAIN  05/29/2018  . IR EXCHANGE BILIARY DRAIN  07/24/2018  . IR EXCHANGE BILIARY DRAIN  09/22/2018  . IR EXCHANGE BILIARY DRAIN  11/20/2018  . IR EXCHANGE BILIARY DRAIN  02/11/2019  . IR EXCHANGE BILIARY DRAIN  04/14/2019  . IR EXCHANGE BILIARY DRAIN  06/09/2019  . IR PERC CHOLECYSTOSTOMY  01/27/2017  . IR RADIOLOGIST EVAL & MGMT  03/05/2017  . MASTECTOMY, PARTIAL Right     Social History   Socioeconomic History  . Marital status: Widowed    Spouse name: Not on file  . Number of children: Not on file  . Years of education: Not on file  . Highest education level: Not on  file  Occupational History  . Not on file  Tobacco Use  . Smoking status: Never Smoker  . Smokeless tobacco: Never Used  Substance and Sexual Activity  . Alcohol use: No  . Drug use: No  . Sexual activity: Not on file  Other Topics Concern  . Not on file  Social History Narrative  . Not on file   Social Determinants of Health   Financial Resource Strain:   . Difficulty of Paying Living Expenses: Not on file  Food Insecurity:   . Worried About Charity fundraiser in the Last Year: Not on file  . Ran Out of Food in the Last Year: Not on file  Transportation Needs:   . Lack of Transportation (Medical): Not on file  . Lack of Transportation (Non-Medical): Not on file  Physical Activity:   . Days of Exercise per Week: Not on file  . Minutes of Exercise per Session: Not on file  Stress:   . Feeling of Stress : Not on file  Social Connections:   . Frequency of Communication with Friends and Family: Not on file  . Frequency of Social Gatherings with Friends and Family: Not on file  . Attends Religious Services: Not on file  . Active  Member of Clubs or Organizations: Not on file  . Attends Archivist Meetings: Not on file  . Marital Status: Not on file  Intimate Partner Violence:   . Fear of Current or Ex-Partner: Not on file  . Emotionally Abused: Not on file  . Physically Abused: Not on file  . Sexually Abused: Not on file   Family History  Problem Relation Age of Onset  . Cancer Father        ? primary  . Diabetes Neg Hx   . Heart disease Neg Hx   . Stroke Neg Hx       VITAL SIGNS BP 126/60   Pulse 76   Temp 98.2 F (36.8 C) (Oral)   Resp 20   Ht 5\' 3"  (1.6 m)   Wt 147 lb 12.8 oz (67 kg)   SpO2 93%   BMI 26.18 kg/m   Outpatient Encounter Medications as of 11/11/2019  Medication Sig  . acetaminophen (TYLENOL) 325 MG tablet Take 650 mg by mouth every 4 (four) hours as needed.  . calcium carbonate (TUMS) 500 MG chewable tablet Chew 1 tablet by  mouth daily.  . Cholecalciferol 1000 units tablet Take 1,000 Units by mouth at bedtime.   . Cyanocobalamin (VITAMIN B-12) 1000 MCG SUBL Place 1 tablet under the tongue at bedtime.  . donepezil (ARICEPT) 10 MG tablet Take 1 tablet by mouth at bedtime.  . NON FORMULARY Diet type:  NAS  . Nutritional Supplements (ENSURE ENLIVE PO) Take 1.5 kcal by mouth 2 (two) times daily between meals.  . Nutritional Supplements (NUTRITIONAL SUPPLEMENT PO) Magic Cup at bedtime for supplement  . polyethylene glycol (MIRALAX / GLYCOLAX) packet Take 17 g by mouth daily as needed.    No facility-administered encounter medications on file as of 11/11/2019.     SIGNIFICANT DIAGNOSTIC EXAMS  PREVIOUS;   08-26-19: ct of abdomen  1. Interval removal of the previously seen percutaneous cholecystostomy tube. Cholelithiasis with findings concerning for acute on chronic cholecystitis. Clinical correlation is recommended. Ultrasound may provide better evaluation of the gallbladder. 2. Diffuse colonic diverticulosis. No bowel obstruction. 3. Aortic Atherosclerosis   NO NEW EXAMS.   LABS REVIEWED: PREVIOUS:   02-17-19: wbc 6.1; hgb 12.9; hct 41.5; mcv 98.3 plt 269 glucose 92; bun 18; creat 0.91; k+ 4.3; na++ 139; ca 8.8 liver normal albumin 3.5  06-24-19; wbc 6.0; hgb 12.5; hct 40.1; mcv 97.1; plt 255; glucose 96; bun 17; creat 0.87; k+ 4.2; na++ 136; ca 8.6; albumin 3.5; lipase 39 07-09-19: liver normal albumin 3.6 07-21-19: wbc 5.2; hgb 11.9; hct 38.5; mcv 97.0 plt 234; glucose 92; bun 19; creat 0.89; k+ 4.1; na++ 138; ca 8.6 08-26-19: wbc 11.8; hgb 11.3; hct 35.7; mcv 96.5 plt 227; glucose 97; bun 31; creat 1.02; k+ 3.8; na++ 139; ca 8.6; liver normal albumin 2.8    NO NEW LABS.   Review of Systems  Constitutional: Negative for malaise/fatigue.  Respiratory: Negative for cough and shortness of breath.   Cardiovascular: Negative for chest pain, palpitations and leg swelling.  Gastrointestinal: Negative for abdominal  pain, constipation and heartburn.  Musculoskeletal: Negative for back pain, joint pain and myalgias.  Skin: Negative.   Neurological: Negative for dizziness.  Psychiatric/Behavioral: The patient is not nervous/anxious.     Physical Exam Constitutional:      General: She is not in acute distress.    Appearance: She is well-developed. She is not diaphoretic.  Neck:     Thyroid: No thyromegaly.  Cardiovascular:     Rate and Rhythm: Normal rate and regular rhythm.     Pulses: Normal pulses.     Heart sounds: Normal heart sounds.  Pulmonary:     Effort: Pulmonary effort is normal. No respiratory distress.     Breath sounds: Normal breath sounds.  Chest:     Comments: History of right partial mastectomy  Abdominal:     General: Bowel sounds are normal. There is no distension.     Palpations: Abdomen is soft.     Tenderness: There is no abdominal tenderness.     Comments: History of right hemi colectomy       Bili drain site with drainage present       Musculoskeletal:     Cervical back: Neck supple.     Right lower leg: No edema.     Left lower leg: No edema.     Comments: Is able to move all extremities  History of C2 fracture    Lymphadenopathy:     Cervical: No cervical adenopathy.  Skin:    General: Skin is warm and dry.  Neurological:     Mental Status: She is alert. Mental status is at baseline.  Psychiatric:        Mood and Affect: Mood normal.        ASSESSMENT/ PLAN:  TODAY  1. Dementia 2. Hypertension 3. C2 cervcal spine fracture  Will continue current medications Will continue current plan of care Will continue to monitor her status.   MD is aware of resident's narcotic use and is in agreement with current plan of care. We will attempt to wean resident as appropriate.  Ok Edwards NP [facility]  Contact [phone] Monday through Friday 8am- 5pm  After hours call [phone]

## 2019-11-13 ENCOUNTER — Ambulatory Visit (HOSPITAL_COMMUNITY)
Admission: RE | Admit: 2019-11-13 | Discharge: 2019-11-13 | Disposition: A | Discharge status: Home / Self Care | Payer: Medicare Other | Source: Ambulatory Visit | Attending: Adult Health | Admitting: Adult Health

## 2019-11-13 DIAGNOSIS — K5730 Diverticulosis of large intestine without perforation or abscess without bleeding: Secondary | ICD-10-CM | POA: Diagnosis not present

## 2019-11-13 DIAGNOSIS — K810 Acute cholecystitis: Secondary | ICD-10-CM | POA: Diagnosis not present

## 2019-11-13 DIAGNOSIS — F05 Delirium due to known physiological condition: Secondary | ICD-10-CM | POA: Diagnosis present

## 2019-11-13 DIAGNOSIS — K8012 Calculus of gallbladder with acute and chronic cholecystitis without obstruction: Secondary | ICD-10-CM | POA: Diagnosis not present

## 2019-11-13 DIAGNOSIS — Z809 Family history of malignant neoplasm, unspecified: Secondary | ICD-10-CM | POA: Diagnosis not present

## 2019-11-13 DIAGNOSIS — K819 Cholecystitis, unspecified: Secondary | ICD-10-CM | POA: Diagnosis not present

## 2019-11-13 DIAGNOSIS — F0391 Unspecified dementia with behavioral disturbance: Secondary | ICD-10-CM | POA: Diagnosis not present

## 2019-11-13 DIAGNOSIS — I10 Essential (primary) hypertension: Secondary | ICD-10-CM | POA: Diagnosis not present

## 2019-11-13 DIAGNOSIS — E039 Hypothyroidism, unspecified: Secondary | ICD-10-CM | POA: Diagnosis not present

## 2019-11-13 DIAGNOSIS — Z853 Personal history of malignant neoplasm of breast: Secondary | ICD-10-CM | POA: Diagnosis not present

## 2019-11-13 DIAGNOSIS — M25531 Pain in right wrist: Secondary | ICD-10-CM | POA: Diagnosis not present

## 2019-11-13 DIAGNOSIS — Z20822 Contact with and (suspected) exposure to covid-19: Secondary | ICD-10-CM | POA: Diagnosis not present

## 2019-11-13 DIAGNOSIS — K8020 Calculus of gallbladder without cholecystitis without obstruction: Secondary | ICD-10-CM | POA: Diagnosis not present

## 2019-11-13 DIAGNOSIS — J9811 Atelectasis: Secondary | ICD-10-CM | POA: Diagnosis not present

## 2019-11-13 DIAGNOSIS — R1031 Right lower quadrant pain: Secondary | ICD-10-CM | POA: Insufficient documentation

## 2019-11-13 DIAGNOSIS — E876 Hypokalemia: Secondary | ICD-10-CM | POA: Diagnosis not present

## 2019-11-13 DIAGNOSIS — Z9011 Acquired absence of right breast and nipple: Secondary | ICD-10-CM | POA: Diagnosis not present

## 2019-11-13 DIAGNOSIS — K8066 Calculus of gallbladder and bile duct with acute and chronic cholecystitis without obstruction: Secondary | ICD-10-CM | POA: Diagnosis not present

## 2019-11-13 DIAGNOSIS — K5909 Other constipation: Secondary | ICD-10-CM | POA: Diagnosis not present

## 2019-11-13 DIAGNOSIS — M79641 Pain in right hand: Secondary | ICD-10-CM | POA: Diagnosis not present

## 2019-11-13 DIAGNOSIS — R7401 Elevation of levels of liver transaminase levels: Secondary | ICD-10-CM | POA: Diagnosis not present

## 2019-11-13 DIAGNOSIS — S6991XA Unspecified injury of right wrist, hand and finger(s), initial encounter: Secondary | ICD-10-CM | POA: Diagnosis not present

## 2019-11-13 DIAGNOSIS — E870 Hyperosmolality and hypernatremia: Secondary | ICD-10-CM | POA: Diagnosis not present

## 2019-11-13 DIAGNOSIS — R1011 Right upper quadrant pain: Secondary | ICD-10-CM | POA: Diagnosis not present

## 2019-11-13 DIAGNOSIS — E539 Vitamin B deficiency, unspecified: Secondary | ICD-10-CM | POA: Diagnosis not present

## 2019-11-14 ENCOUNTER — Encounter: Payer: Self-pay | Admitting: Internal Medicine

## 2019-11-14 NOTE — Progress Notes (Signed)
Location:   [employer] of Service:   SNF  Hennie Duos, MD  Patient Care Team: Hennie Duos, MD as PCP - General (Internal Medicine) Nyoka Cowden Phylis Bougie, NP as Nurse Practitioner ([place], [place] ([place])  Extended Emergency Contact Information Primary Emergency Contact: [place], [place] [address] Johnnette Litter of [place] Phone: [phone] Relation: Friend Secondary Emergency Contact: [place] of [place] Phone: [phone] Relation: Daughter    Allergies: Patient has no known allergies.  Chief Complaint  Patient presents with  . Acute Visit    HPI: Patient is 86 y.o. female who nursing asked me to say because patient has a very large bruise on her right arm.  Is present in the facility of where her PICC line was and nursing is very concerned.  Patient was recently diagnosed with COVID-19, she is asymptomatic, and she did receive monoclonal antibodies 2 days ago through a PICC line.  Patient has no complaints of pain. Past Medical History:  Diagnosis Date  . Breast cancer ([place])   . Cognitive communication deficit   . Dementia ([place])   . Difficulty in walking(719.7)   . Fall   . HTN (hypertension) 08/26/2016  . Muscle weakness (generalized)   . Urinary tract infection, site not specified   . Vitamin B deficiency     Past Surgical History:  Procedure Laterality Date  . COLON SURGERY    . IR CATHETER TUBE CHANGE  05/07/2017  . IR EXCHANGE BILIARY DRAIN  07/02/2017  . IR EXCHANGE BILIARY DRAIN  07/10/2017  . IR EXCHANGE BILIARY DRAIN  08/28/2017  . IR EXCHANGE BILIARY DRAIN  10/11/2017  . IR EXCHANGE BILIARY DRAIN  10/28/2017  . IR EXCHANGE BILIARY DRAIN  12/23/2017  . IR EXCHANGE BILIARY DRAIN  01/23/2018  . IR EXCHANGE BILIARY DRAIN  02/17/2018  . IR EXCHANGE BILIARY DRAIN  03/20/2018  . IR EXCHANGE BILIARY DRAIN  04/17/2018  . IR EXCHANGE BILIARY DRAIN   05/29/2018  . IR EXCHANGE BILIARY DRAIN  07/24/2018  . IR EXCHANGE BILIARY DRAIN  09/22/2018  . IR EXCHANGE BILIARY DRAIN  11/20/2018  . IR EXCHANGE BILIARY DRAIN  02/11/2019  . IR EXCHANGE BILIARY DRAIN  04/14/2019  . IR EXCHANGE BILIARY DRAIN  06/09/2019  . IR PERC CHOLECYSTOSTOMY  01/27/2017  . IR RADIOLOGIST EVAL & MGMT  03/05/2017  . MASTECTOMY, PARTIAL Right     Allergies as of 11/04/2019   No Known Allergies     Medication List    Notice   This visit is during an admission. Changes to the med list made in this visit will be reflected in the After Visit Summary of the admission.    Current Outpatient Medications on File Prior to Visit  Medication Sig Dispense Refill  . acetaminophen (TYLENOL) 325 MG tablet Take 650 mg by mouth every 4 (four) hours as needed.    . calcium carbonate (TUMS) 500 MG chewable tablet Chew 1 tablet by mouth daily.    . Cholecalciferol 1000 units tablet Take 1,000 Units by mouth at bedtime.     . Cyanocobalamin (VITAMIN B-12) 1000 MCG SUBL Place 1 tablet under the tongue at bedtime.    . donepezil (ARICEPT) 10 MG tablet Take 1 tablet by mouth at bedtime.    . NON FORMULARY Diet type:  NAS    . Nutritional Supplements (NUTRITIONAL SUPPLEMENT PO) Magic Cup at bedtime  for supplement    . polyethylene glycol (MIRALAX / GLYCOLAX) packet Take 17 g by mouth daily as needed.      No current facility-administered medications on file prior to visit.     No orders of the defined types were placed in this encounter.   Immunization History  Administered Date(s) Administered  . Influenza-Unspecified 07/28/2014, 07/26/2017, 07/24/2018, 07/27/2019  . Moderna SARS-COVID-2 Vaccination 10/28/2019  . Pneumococcal Conjugate-13 06/21/2017  . Pneumococcal-Unspecified 08/01/2016  . Tdap 07/11/2017    Social History   Tobacco Use  . Smoking status: Never Smoker  . Smokeless tobacco: Never Used  Substance Use Topics  . Alcohol use: No    Review of  Systems  GENERAL:  no fevers, fatigue, appetite changes SKIN: Bruise right arm HEENT: No complaint RESPIRATORY: No cough, wheezing, SOB CARDIAC: No chest pain, palpitations, lower extremity edema  GI: No abdominal pain, No N/V/D or constipation, No heartburn or reflux  GU: No dysuria, frequency or urgency, or incontinence  MUSCULOSKELETAL: No unrelieved bone/joint pain NEUROLOGIC: No headache, dizziness  PSYCHIATRIC: No overt anxiety or sadness  Vitals:   11/14/19 1013  BP: 126/68  Pulse: 62  Resp: 18  Temp: 98.3 F (36.8 C)   Body mass index is 26.04 kg/m. Physical Exam  GENERAL APPEARANCE: Alert, conversant, No acute distress  SKIN: Patient does have a large bruise on her right arm but it is in the superficial tissues, some minimal swelling involved; there is nothing deep or painful; I can still see the entrance site of the PICC line HEENT: Unremarkable RESPIRATORY: Breathing is even, unlabored. Lung sounds are clear   CARDIOVASCULAR: Heart RRR no murmurs, rubs or gallops. No peripheral edema  GASTROINTESTINAL: Abdomen is soft, non-tender, not distended w/ normal bowel sounds.  GENITOURINARY: Bladder non tender, not distended  MUSCULOSKELETAL: No abnormal joints or musculature NEUROLOGIC: Cranial nerves 2-12 grossly intact. Moves all extremities PSYCHIATRIC: Mood and affect appropriate to situation, no behavioral issues  Patient Active Problem List   Diagnosis Date Noted  . COVID-19 virus infection 11/02/2019  . Abscess of skin of abdomen 08/29/2019  . Chronic constipation 12/20/2018  . S/P Cholecystostomy 01/31/2017  . Cholecystitis, acute with cholelithiasis 01/26/2017  . HTN (hypertension) 08/26/2016  . Breast cancer ([place]) 12/11/2013  . Benign neoplasm of colon 12/11/2013  . Dementia ([place]) 12/11/2013  . C2 cervical fracture ([facility]) 12/08/2013    CMP     Component Value Date/Time   NA 138 10/29/2019 1430   NA 141 10/27/2015 0000   K 4.1 10/29/2019 1430   CL  106 10/29/2019 1430   CO2 25 10/29/2019 1430   GLUCOSE 146 (H) 10/29/2019 1430   BUN 20 10/29/2019 1430   BUN 13 10/27/2015 0000   CREATININE 0.90 10/29/2019 1430   CALCIUM 8.7 (L) 10/29/2019 1430   PROT 6.4 (L) 08/26/2019 1215   ALBUMIN 2.8 (L) 08/26/2019 1215   AST 22 08/26/2019 1215   ALT 19 08/26/2019 1215   ALKPHOS 67 08/26/2019 1215   BILITOT 0.4 08/26/2019 1215   GFRNONAA 56 (L) 10/29/2019 1430   GFRAA >60 10/29/2019 1430   Recent Labs    07/21/19 0717 08/26/19 1215 10/29/19 1430  NA 138 139 138  K 4.1 3.8 4.1  CL 104 103 106  CO2 27 26 25   GLUCOSE 92 97 146*  BUN 19 31* 20  CREATININE 0.89 1.02* 0.90  CALCIUM 8.6* 8.6* 8.7*   Recent Labs    06/24/19 0808 07/09/19 1115 08/26/19 1215  AST 16  16 22  ALT 13 12 19   ALKPHOS 66 69 67  BILITOT 0.4 0.3 0.4  PROT 6.8 7.2 6.4*  ALBUMIN 3.5 3.6 2.8*   Recent Labs    06/24/19 0808 06/24/19 0808 07/21/19 0717 08/26/19 1215 10/29/19 1430  WBC 6.0   < > 5.2 11.8* 5.6  NEUTROABS 3.5  --   --  10.1* 3.9  HGB 12.5   < > 11.9* 11.3* 12.1  HCT 40.1   < > 38.5 35.7* 38.4  MCV 97.1   < > 97.0 96.5 98.2  PLT 255   < > 234 227 213   < > = values in this interval not displayed.   No results for input(s): CHOL, LDLCALC, TRIG in the last 8760 hours.  Invalid input(s): HCL No results found for: [facility] Lab Results  Component Value Date   TSH 3.877 01/02/2018   No results found for: HGBA1C No results found for: CHOL, HDL, LDLCALC, LDLDIRECT, TRIG, CHOLHDL  Significant Diagnostic Results in last 30 days:  CT ABDOMEN PELVIS WO CONTRAST  Result Date: 11/13/2019 CLINICAL DATA:  Right-sided abdominal pain EXAM: CT ABDOMEN AND PELVIS WITHOUT CONTRAST TECHNIQUE: Multidetector CT imaging of the abdomen and pelvis was performed following the standard protocol without IV contrast. COMPARISON:  08/26/2019 FINDINGS: Lower chest: Unchanged 4 mm nodule within the medial aspect of the left lower lobe (series 4, image 18). 4 mm  right lower lobe nodule (series 4, image 10) is also unchanged. No new or acute findings within the visualized lung bases. Hepatobiliary: Gallbladder is mildly dilated with circumferential gallbladder wall thickening and multiple hyperdense gallstones both in the dependent portions of the gallbladder as well as within the gallbladder neck (series 2, image 24). There is surrounding pericholecystic fat stranding and trace fluid. No well-defined fluid collection or abscess. No pneumoperitoneum. Liver is unremarkable. No intrahepatic biliary dilatation. Pancreas: Unremarkable. No pancreatic ductal dilatation or surrounding inflammatory changes. Spleen: Normal in size without focal abnormality. Adrenals/Urinary Tract: Unremarkable adrenal glands. Right renal atrophy and scarring with nonobstructing 1-2 mm calculus. Left kidney within normal limits. Ureters are nondilated. Urinary bladder is incompletely distended but appears otherwise unremarkable. Stomach/Bowel: Postsurgical changes to the bowel with anastomotic suture in the right lower quadrant. Extensive colonic diverticulosis. No pericolonic inflammatory changes. No evidence of bowel obstruction. Small hiatal hernia. Redemonstrated duodenal diverticulum. Vascular/Lymphatic: Aortic atherosclerosis. No enlarged abdominal or pelvic lymph nodes. Reproductive: Uterus and bilateral adnexa are unremarkable. Other: Pessary in place. Rectus diastasis. Numerous surgical clips in the right upper abdomen. Musculoskeletal: No acute or significant osseous findings. Previously seen tract from prior cholecystostomy tube appears well healed. IMPRESSION: 1. Persistently thickened gallbladder wall with numerous gallstones, including one at the level of the gallbladder neck. Surrounding pericholecystic fat stranding without a well-defined fluid collection or evidence of abscess. Findings are concerning for acute on chronic cholecystitis. Consider nuclear medicine hepatobiliary scan to  evaluate patency of the cystic and common bile ducts. 2. Extensive colonic diverticulosis without evidence of diverticulitis. Aortic Atherosclerosis (ICD10-I70.0). These results will be called to the ordering clinician or representative by the Radiologist Assistant, and communication documented in the PACS or zVision Dashboard. Electronically Signed   By: Davina Poke D.O.   On: 11/13/2019 15:58    Assessment and Plan  Bruise right arm/status post PICC arm-the bruises of blood that extravasated from the vein the PICC line was then it, is now on the surface, does not represent a danger, and will resolve without intervention.    Noah Delaine  Sheppard Coil, MD

## 2019-11-16 ENCOUNTER — Other Ambulatory Visit: Payer: Self-pay

## 2019-11-16 ENCOUNTER — Encounter (HOSPITAL_COMMUNITY): Payer: Self-pay | Admitting: Emergency Medicine

## 2019-11-16 ENCOUNTER — Emergency Department (HOSPITAL_COMMUNITY): Payer: Medicare Other

## 2019-11-16 ENCOUNTER — Telehealth: Payer: Self-pay

## 2019-11-16 ENCOUNTER — Inpatient Hospital Stay (HOSPITAL_COMMUNITY)
Admission: EM | Admit: 2019-11-16 | Discharge: 2019-11-21 | DRG: 445 | Disposition: A | Discharge status: Skilled Nursing Facility | Payer: Medicare Other | Source: Ambulatory Visit | Attending: Internal Medicine | Admitting: Internal Medicine

## 2019-11-16 DIAGNOSIS — R7401 Elevation of levels of liver transaminase levels: Secondary | ICD-10-CM | POA: Diagnosis present

## 2019-11-16 DIAGNOSIS — K810 Acute cholecystitis: Secondary | ICD-10-CM

## 2019-11-16 DIAGNOSIS — M79641 Pain in right hand: Secondary | ICD-10-CM | POA: Diagnosis not present

## 2019-11-16 DIAGNOSIS — E876 Hypokalemia: Secondary | ICD-10-CM | POA: Diagnosis not present

## 2019-11-16 DIAGNOSIS — E039 Hypothyroidism, unspecified: Secondary | ICD-10-CM | POA: Diagnosis present

## 2019-11-16 DIAGNOSIS — K5909 Other constipation: Secondary | ICD-10-CM | POA: Diagnosis present

## 2019-11-16 DIAGNOSIS — Z809 Family history of malignant neoplasm, unspecified: Secondary | ICD-10-CM

## 2019-11-16 DIAGNOSIS — K819 Cholecystitis, unspecified: Secondary | ICD-10-CM | POA: Diagnosis not present

## 2019-11-16 DIAGNOSIS — F0390 Unspecified dementia without behavioral disturbance: Secondary | ICD-10-CM | POA: Diagnosis present

## 2019-11-16 DIAGNOSIS — Z20822 Contact with and (suspected) exposure to covid-19: Secondary | ICD-10-CM | POA: Diagnosis present

## 2019-11-16 DIAGNOSIS — I10 Essential (primary) hypertension: Secondary | ICD-10-CM | POA: Diagnosis present

## 2019-11-16 DIAGNOSIS — E539 Vitamin B deficiency, unspecified: Secondary | ICD-10-CM | POA: Diagnosis not present

## 2019-11-16 DIAGNOSIS — Z9011 Acquired absence of right breast and nipple: Secondary | ICD-10-CM

## 2019-11-16 DIAGNOSIS — M25531 Pain in right wrist: Secondary | ICD-10-CM | POA: Diagnosis not present

## 2019-11-16 DIAGNOSIS — F03918 Unspecified dementia, unspecified severity, with other behavioral disturbance: Secondary | ICD-10-CM | POA: Diagnosis present

## 2019-11-16 DIAGNOSIS — K8066 Calculus of gallbladder and bile duct with acute and chronic cholecystitis without obstruction: Secondary | ICD-10-CM | POA: Diagnosis not present

## 2019-11-16 DIAGNOSIS — E870 Hyperosmolality and hypernatremia: Secondary | ICD-10-CM | POA: Diagnosis present

## 2019-11-16 DIAGNOSIS — K8012 Calculus of gallbladder with acute and chronic cholecystitis without obstruction: Principal | ICD-10-CM | POA: Diagnosis present

## 2019-11-16 DIAGNOSIS — F05 Delirium due to known physiological condition: Secondary | ICD-10-CM | POA: Diagnosis present

## 2019-11-16 DIAGNOSIS — R1011 Right upper quadrant pain: Secondary | ICD-10-CM | POA: Diagnosis not present

## 2019-11-16 DIAGNOSIS — W19XXXA Unspecified fall, initial encounter: Secondary | ICD-10-CM

## 2019-11-16 DIAGNOSIS — Z853 Personal history of malignant neoplasm of breast: Secondary | ICD-10-CM | POA: Diagnosis not present

## 2019-11-16 DIAGNOSIS — F0391 Unspecified dementia with behavioral disturbance: Secondary | ICD-10-CM | POA: Diagnosis not present

## 2019-11-16 HISTORY — DX: Rash and other nonspecific skin eruption: R21

## 2019-11-16 HISTORY — DX: Disorder of thyroid, unspecified: E07.9

## 2019-11-16 LAB — COMPREHENSIVE METABOLIC PANEL
ALT: 81 U/L — ABNORMAL HIGH (ref 0–44)
AST: 103 U/L — ABNORMAL HIGH (ref 15–41)
Albumin: 3 g/dL — ABNORMAL LOW (ref 3.5–5.0)
Alkaline Phosphatase: 84 U/L (ref 38–126)
Anion gap: 12 (ref 5–15)
BUN: 31 mg/dL — ABNORMAL HIGH (ref 8–23)
CO2: 21 mmol/L — ABNORMAL LOW (ref 22–32)
Calcium: 8.5 mg/dL — ABNORMAL LOW (ref 8.9–10.3)
Chloride: 105 mmol/L (ref 98–111)
Creatinine, Ser: 1.05 mg/dL — ABNORMAL HIGH (ref 0.44–1.00)
GFR calc Af Amer: 53 mL/min — ABNORMAL LOW (ref >60)
GFR calc non Af Amer: 46 mL/min — ABNORMAL LOW (ref >60)
Glucose, Bld: 122 mg/dL — ABNORMAL HIGH (ref 70–99)
Potassium: 3.7 mmol/L (ref 3.5–5.1)
Sodium: 138 mmol/L (ref 135–145)
Total Bilirubin: 0.8 mg/dL (ref 0.3–1.2)
Total Protein: 7 g/dL (ref 6.5–8.1)

## 2019-11-16 LAB — CBC WITH DIFFERENTIAL/PLATELET
Abs Immature Granulocytes: 0.04 10*3/uL (ref 0.00–0.07)
Basophils Absolute: 0.1 10*3/uL (ref 0.0–0.1)
Basophils Relative: 1 %
Eosinophils Absolute: 0 10*3/uL (ref 0.0–0.5)
Eosinophils Relative: 0 %
HCT: 39.3 % (ref 36.0–46.0)
Hemoglobin: 12.1 g/dL (ref 12.0–15.0)
Immature Granulocytes: 0 %
Lymphocytes Relative: 6 %
Lymphs Abs: 0.7 10*3/uL (ref 0.7–4.0)
MCH: 30.3 pg (ref 26.0–34.0)
MCHC: 30.8 g/dL (ref 30.0–36.0)
MCV: 98.5 fL (ref 80.0–100.0)
Monocytes Absolute: 1 10*3/uL (ref 0.1–1.0)
Monocytes Relative: 10 %
Neutro Abs: 8.5 10*3/uL — ABNORMAL HIGH (ref 1.7–7.7)
Neutrophils Relative %: 83 %
Platelets: 233 10*3/uL (ref 150–400)
RBC: 3.99 MIL/uL (ref 3.87–5.11)
RDW: 16.6 % — ABNORMAL HIGH (ref 11.5–15.5)
WBC: 10.3 10*3/uL (ref 4.0–10.5)
nRBC: 0 % (ref 0.0–0.2)

## 2019-11-16 LAB — LIPASE, BLOOD: Lipase: 28 U/L (ref 11–51)

## 2019-11-16 MED ORDER — DONEPEZIL HCL 5 MG PO TABS
10.0000 mg | ORAL_TABLET | Freq: Every day | ORAL | Status: DC
  Administered 2019-11-16 – 2019-11-20 (×5): 10 mg via ORAL
  Filled 2019-11-16 (×5): qty 2

## 2019-11-16 MED ORDER — METRONIDAZOLE IN NACL 5-0.79 MG/ML-% IV SOLN
500.0000 mg | Freq: Three times a day (TID) | INTRAVENOUS | Status: DC
  Administered 2019-11-16 – 2019-11-17 (×2): 500 mg via INTRAVENOUS
  Filled 2019-11-16 (×2): qty 100

## 2019-11-16 MED ORDER — ONDANSETRON HCL 4 MG/2ML IJ SOLN: 4.0000 mg | Freq: Four times a day (QID) | INTRAMUSCULAR | Status: DC | PRN

## 2019-11-16 MED ORDER — ACETAMINOPHEN 325 MG PO TABS
650.0000 mg | ORAL_TABLET | Freq: Four times a day (QID) | ORAL | Status: DC | PRN
  Administered 2019-11-18: 650 mg via ORAL
  Filled 2019-11-16: qty 2

## 2019-11-16 MED ORDER — POLYETHYLENE GLYCOL 3350 17 G PO PACK: 17.0000 g | PACK | Freq: Every day | ORAL | Status: DC | PRN

## 2019-11-16 MED ORDER — SODIUM CHLORIDE 0.9 % IV SOLN
2.0000 g | INTRAVENOUS | Status: DC
  Administered 2019-11-16: 2 g via INTRAVENOUS
  Filled 2019-11-16: qty 20

## 2019-11-16 MED ORDER — ONDANSETRON HCL 4 MG PO TABS: 4.0000 mg | ORAL_TABLET | Freq: Four times a day (QID) | ORAL | Status: DC | PRN

## 2019-11-16 MED ORDER — SODIUM CHLORIDE 0.9 % IV SOLN: INTRAVENOUS | Status: DC

## 2019-11-16 MED ORDER — ACETAMINOPHEN 650 MG RE SUPP: 650.0000 mg | Freq: Four times a day (QID) | RECTAL | Status: DC | PRN

## 2019-11-16 MED ORDER — POTASSIUM CHLORIDE IN NACL 20-0.9 MEQ/L-% IV SOLN: INTRAVENOUS | Status: AC

## 2019-11-16 MED ORDER — MORPHINE SULFATE (PF) 2 MG/ML IV SOLN: 1.0000 mg | INTRAVENOUS | Status: DC | PRN

## 2019-11-16 NOTE — H&P (Signed)
History and Physical    Cynthia [name] [id number] DOB: 1925/01/15 DOA: 11/16/2019  PCP: Hennie Duos, MD   Patient coming from: [name]  I have personally briefly reviewed patient's old medical records in [place]  Chief Complaint: abdominal pain  HPI: Cynthia [name] is a 86 y.o. female with medical history significant for dementia, HTN, breast cancer. Patient was sent to the ED for further work-up of cholecystitis, as recommended by Dr. Arnoldo Morale.  Patient has a history of dementia, she is able to answer questions appropriately but not give me a detailed history.  She reports abdominal pain to the right upper abdomen.  No vomiting, no diarrhea.  No pain with urination.  In the past patient has required a cholecystostomy tube for her cholecystitis.  Patient had a CT scan 11/13/19 which showed persistently thickened gallbladder with numerous gallstones.  Concerning for acute on chronic cholecystitis.  Bile duct was not well-visualized  ED Course: Afebrile.  Stable vitals.  WBC 10.3.  Mildly elevated transaminases AST 103 ALT 81 with normal ALP and bilirubin.  Right upper quadrant ultrasound shows acute cholecystitis.  ED provider talked to Dr. Arnoldo Morale, at this time there are no plans for surgery tomorrow.  Hospitalist to admit for further evaluation and management.  Review of Systems: As per HPI all other systems reviewed and negative.  Past Medical History:  Diagnosis Date  . Breast cancer ([place])   . Cognitive communication deficit   . Dementia ([place])   . Difficulty in walking(719.7)   . Fall   . HTN (hypertension) 08/26/2016  . Muscle weakness (generalized)   . Rash and nonspecific skin eruption   . Thyroid disease    hypo  . Urinary tract infection, site not specified   . Vitamin B deficiency     Past Surgical History:  Procedure Laterality Date  . COLON SURGERY    . IR CATHETER TUBE CHANGE  05/07/2017  . IR EXCHANGE BILIARY DRAIN  07/02/2017  . IR EXCHANGE  BILIARY DRAIN  07/10/2017  . IR EXCHANGE BILIARY DRAIN  08/28/2017  . IR EXCHANGE BILIARY DRAIN  10/11/2017  . IR EXCHANGE BILIARY DRAIN  10/28/2017  . IR EXCHANGE BILIARY DRAIN  12/23/2017  . IR EXCHANGE BILIARY DRAIN  01/23/2018  . IR EXCHANGE BILIARY DRAIN  02/17/2018  . IR EXCHANGE BILIARY DRAIN  03/20/2018  . IR EXCHANGE BILIARY DRAIN  04/17/2018  . IR EXCHANGE BILIARY DRAIN  05/29/2018  . IR EXCHANGE BILIARY DRAIN  07/24/2018  . IR EXCHANGE BILIARY DRAIN  09/22/2018  . IR EXCHANGE BILIARY DRAIN  11/20/2018  . IR EXCHANGE BILIARY DRAIN  02/11/2019  . IR EXCHANGE BILIARY DRAIN  04/14/2019  . IR EXCHANGE BILIARY DRAIN  06/09/2019  . IR PERC CHOLECYSTOSTOMY  01/27/2017  . IR RADIOLOGIST EVAL & MGMT  03/05/2017  . MASTECTOMY, PARTIAL Right      reports that she has never smoked. She has never used smokeless tobacco. She reports that she does not drink alcohol or use drugs.  No Known Allergies  Family History  Problem Relation Age of Onset  . Cancer Father        ? primary  . Diabetes Neg Hx   . Heart disease Neg Hx   . Stroke Neg Hx     Prior to Admission medications   Medication Sig Start Date End Date Taking? Authorizing Provider  acetaminophen (TYLENOL) 325 MG tablet Take 650 mg by mouth every 4 (four) hours as needed.  Yes [provider]  calcium carbonate (TUMS) 500 MG chewable tablet Chew 1 tablet by mouth daily.   Yes [provider]  Cholecalciferol 1000 units tablet Take 1,000 Units by mouth at bedtime.    Yes [provider]  Cyanocobalamin (VITAMIN B-12) 1000 MCG SUBL Place 1 tablet under the tongue at bedtime.   Yes [provider]  donepezil (ARICEPT) 10 MG tablet Take 1 tablet by mouth at bedtime. 01/24/17  Yes [provider]  Nutritional Supplements (ENSURE ENLIVE PO) Take 1.5 kcal by mouth 2 (two) times daily between meals.   Yes [provider]  polyethylene glycol (MIRALAX / GLYCOLAX) packet Take 17 g by mouth daily as  needed for mild constipation or moderate constipation.    Yes [provider]    Physical Exam: Vitals:   11/16/19 1720 11/16/19 1800 11/16/19 1815 11/16/19 1830  BP:  122/62  (!) 154/75  Pulse: 88 83 92   Resp:   (!) 28 (!) 25  Temp: 98.1 F (36.7 C)     TempSrc: Oral     SpO2: 97% 94% 96%   Weight:      Height:        Constitutional: NAD, calm, comfortable Vitals:   11/16/19 1720 11/16/19 1800 11/16/19 1815 11/16/19 1830  BP:  122/62  (!) 154/75  Pulse: 88 83 92   Resp:   (!) 28 (!) 25  Temp: 98.1 F (36.7 C)     TempSrc: Oral     SpO2: 97% 94% 96%   Weight:      Height:       Eyes: PERRL, lids and conjunctivae normal ENMT: Mucous membranes are moist. Posterior pharynx clear of any exudate or lesions.  Neck: normal, supple, no masses, no thyromegaly Respiratory: clear to auscultation bilaterally, no wheezing, no crackles. Normal respiratory effort. No accessory muscle use.  Cardiovascular: Regular rate and rhythm, no murmurs / rubs / gallops. No extremity edema. 2+ pedal pulses. No carotid bruits.  Abdomen: Moderate tenderness to right upper quadrant, abdominal soft without guarding, No hepatosplenomegaly. Bowel sounds positive.  Healed scar from cholecystostomy tube right side of abdomen. Musculoskeletal: no clubbing / cyanosis. No joint deformity upper and lower extremities. Good ROM, no contractures. Normal muscle tone.  Skin: no rashes, lesions, ulcers. No induration Neurologic: Cranial nerve abnormality, moving all extremities spontaneously Psychiatric: Awake, alert, oriented to person and place but not time, she is able to tell me why she is in the hospital.  Normal mood.   Labs on Admission: I have personally reviewed following labs and imaging studies  CBC: Recent Labs  Lab 11/16/19 1620  WBC 10.3  NEUTROABS 8.5*  HGB 12.1  HCT 39.3  MCV 98.5  PLT 0000000   Basic Metabolic Panel: Recent Labs  Lab 11/16/19 1620  NA 138  K 3.7  CL 105  CO2 21*   GLUCOSE 122*  BUN 31*  CREATININE 1.05*  CALCIUM 8.5*   Liver Function Tests: Recent Labs  Lab 11/16/19 1620  AST 103*  ALT 81*  ALKPHOS 84  BILITOT 0.8  PROT 7.0  ALBUMIN 3.0*   Recent Labs  Lab 11/16/19 1620  LIPASE 28    Radiological Exams on Admission: US Abdomen Limited  Result Date: 11/16/2019 CLINICAL DATA:  History of cholelithiasis with right upper quadrant pain. EXAM: ULTRASOUND ABDOMEN LIMITED RIGHT UPPER QUADRANT COMPARISON:  CT abdomen pelvis 11/13/2019. FINDINGS: Gallbladder: There are shadowing echogenic stones and sludge in the gallbladder. Gallbladder wall is  thickened, 8 mm. Pericholecystic fluid is present. Positive sonographic Murphy sign. Common bile duct: Diameter: Poorly visualized due to patient condition and inability to breath hold. Liver: No focal lesion identified. Within normal limits in parenchymal echogenicity. Portal vein is patent on color Doppler imaging with normal direction of blood flow towards the liver. Other: None. IMPRESSION: Acute cholecystitis. Electronically Signed   By: Lorin Picket M.D.   On: 11/16/2019 16:04   DG Chest Port 1 View  Result Date: 11/16/2019 CLINICAL DATA:  Pre-admission chest x-ray 4 cholecystitis. EXAM: PORTABLE CHEST 1 VIEW COMPARISON:  October 09, 2017 FINDINGS: Very mild linear atelectasis is seen within the right lung base. There is no evidence of a pleural effusion or pneumothorax. The heart size and mediastinal contours are within normal limits. There is moderate severity calcification of the aortic arch. Multilevel degenerative changes are seen throughout the thoracic spine. IMPRESSION: 1. Very mild right basilar linear atelectasis. Electronically Signed   By: Virgina Norfolk M.D.   On: 11/16/2019 17:17    EKG: None.  Assessment/Plan Active Problems:   Acute cholecystitis  Acute cholecystitis-right upper quadrant tenderness, history of cholecystostomy tube.  WBC 10.3.  Rules out for sepsis.   Transaminitis AST 103 ALT 81 with normal ALP and total bilirubin.  CT abdomen pelvis 1/22-acute on chronic cholecystitis.  No evidence of abscess.  Abdominal ultrasound today- acute cholecystitis. - Will start IV ceftriaxone 2g  -IV metronidazole  -General surgery to see in a.m. -CBC, CMP a.m. -Tylenol, morphine 1 mg as needed for pain  Dementia -stable, alert and oriented to person and place.  -Resume home donepezil  Hypertension-stable.  Hypertensive agents.  History of breast cancer-status post partial right mastectomy.  Problem list has COVID-19 virus infection, but I do not see a positive Covid result on file.  DVT prophylaxis: SCDs for now Code Status: Full code, I confirmed with nursing home Family Communication: None at bedside Disposition Plan: Pending surgical evaluation Consults called: General surgery Admission status: Inpatient, surgery I certify that at the point of admission it is my clinical judgment that the patient will require inpatient hospital care spanning beyond 2 midnights from the point of admission due to high intensity of service, high risk for further deterioration and high frequency of surveillance required. The following factors support the patient status of inpatient: Requiring IV antibiotics.   Bethena Roys MD [facility]  11/16/2019, 7:56 PM

## 2019-11-16 NOTE — ED Notes (Signed)
IV started, unable to get labs. Phlebotomy aware.

## 2019-11-16 NOTE — Telephone Encounter (Signed)
Called Janett Billow at [facility] regarding patient, send patient to ED for further workup of chronic cholecystitis, order given to [facility], per Dr Arnoldo Morale.

## 2019-11-16 NOTE — ED Notes (Signed)
[place], [place] RN regarding pt admission to 318.

## 2019-11-16 NOTE — ED Notes (Signed)
US at bedside

## 2019-11-16 NOTE — ED Triage Notes (Signed)
[facility]. Per [facility], pt brought over per Dr. Arnoldo Morale office related to abdominal pain. Pt had CT on Friday and was referred to Dr. Arnoldo Morale r/t gallbladder.   Upon assessment, pt denies abdominal pain, n/v/d. Pt reports right arm pain when blood pressure cuff applied. No other complaints at this time.

## 2019-11-16 NOTE — ED Provider Notes (Signed)
[facility] EMERGENCY DEPARTMENT Provider Note   CSN: [id number] Arrival date & time: 11/16/19  1436     History Chief Complaint  Patient presents with  . Abdominal Pain    Cynthia [name] is a 86 y.o. female.  Cynthia [name] is from the [facility].  Dr. Arnoldo Morale referred her over for evaluation.  In the past Dr. Arnoldo Morale had done a cholecystostomy tube on her for cholecystitis.  The tube is no longer in place.  Patient has clinical concerns for either acute cholecystitis or chronic cholecystitis.  Difficult to sort out.  Patient has no complaints.  However she is tender in the right upper quadrant part of the abdomen.  Patient had CT scan done on January 26 that raise concerns for cholecystitis.  Common bile duct has not been well visualized.  Patient's vital signs not febrile not tachycardic not hypotensive the patient appears in no acute distress.  Past medical history significant for dementia difficulty in walking cognitive communication deficit hypertension        Past Medical History:  Diagnosis Date  . Breast cancer ([place])   . Cognitive communication deficit   . Dementia ([place])   . Difficulty in walking(719.7)   . Fall   . HTN (hypertension) 08/26/2016  . Muscle weakness (generalized)   . Rash and nonspecific skin eruption   . Thyroid disease    hypo  . Urinary tract infection, site not specified   . Vitamin B deficiency     Patient Active Problem List   Diagnosis Date Noted  . COVID-19 virus infection 11/02/2019  . Abscess of skin of abdomen 08/29/2019  . Chronic constipation 12/20/2018  . S/P Cholecystostomy 01/31/2017  . Cholecystitis, acute with cholelithiasis 01/26/2017  . HTN (hypertension) 08/26/2016  . Breast cancer ([place]) 12/11/2013  . Benign neoplasm of colon 12/11/2013  . Dementia ([place]) 12/11/2013  . C2 cervical fracture ([place]) 12/08/2013    Past Surgical History:  Procedure Laterality Date  . COLON SURGERY    . IR CATHETER TUBE CHANGE  05/07/2017   . IR EXCHANGE BILIARY DRAIN  07/02/2017  . IR EXCHANGE BILIARY DRAIN  07/10/2017  . IR EXCHANGE BILIARY DRAIN  08/28/2017  . IR EXCHANGE BILIARY DRAIN  10/11/2017  . IR EXCHANGE BILIARY DRAIN  10/28/2017  . IR EXCHANGE BILIARY DRAIN  12/23/2017  . IR EXCHANGE BILIARY DRAIN  01/23/2018  . IR EXCHANGE BILIARY DRAIN  02/17/2018  . IR EXCHANGE BILIARY DRAIN  03/20/2018  . IR EXCHANGE BILIARY DRAIN  04/17/2018  . IR EXCHANGE BILIARY DRAIN  05/29/2018  . IR EXCHANGE BILIARY DRAIN  07/24/2018  . IR EXCHANGE BILIARY DRAIN  09/22/2018  . IR EXCHANGE BILIARY DRAIN  11/20/2018  . IR EXCHANGE BILIARY DRAIN  02/11/2019  . IR EXCHANGE BILIARY DRAIN  04/14/2019  . IR EXCHANGE BILIARY DRAIN  06/09/2019  . IR PERC CHOLECYSTOSTOMY  01/27/2017  . IR RADIOLOGIST EVAL & MGMT  03/05/2017  . MASTECTOMY, PARTIAL Right      OB History   No obstetric history on file.     Family History  Problem Relation Age of Onset  . Cancer Father        ? primary  . Diabetes Neg Hx   . Heart disease Neg Hx   . Stroke Neg Hx     Social History   Tobacco Use  . Smoking status: Never Smoker  . Smokeless tobacco: Never Used  Substance Use Topics  . Alcohol use: No  . Drug  use: No    Home Medications Prior to Admission medications   Medication Sig Start Date End Date Taking? Authorizing Provider  acetaminophen (TYLENOL) 325 MG tablet Take 650 mg by mouth every 4 (four) hours as needed.    [provider]  calcium carbonate (TUMS) 500 MG chewable tablet Chew 1 tablet by mouth daily.    [provider]  Cholecalciferol 1000 units tablet Take 1,000 Units by mouth at bedtime.     [provider]  Cyanocobalamin (VITAMIN B-12) 1000 MCG SUBL Place 1 tablet under the tongue at bedtime.    [provider]  donepezil (ARICEPT) 10 MG tablet Take 1 tablet by mouth at bedtime. 01/24/17   [provider]  NON FORMULARY Diet type:  NAS    [provider]  Nutritional Supplements (ENSURE  ENLIVE PO) Take 1.5 kcal by mouth 2 (two) times daily between meals.    [provider]  Nutritional Supplements (NUTRITIONAL SUPPLEMENT PO) Magic Cup at bedtime for supplement    [provider]  polyethylene glycol (MIRALAX / GLYCOLAX) packet Take 17 g by mouth daily as needed.     [provider]    Allergies    Patient has no known allergies.  Review of Systems   Review of Systems  Unable to perform ROS: Dementia    Physical Exam Updated Vital Signs BP 137/69   Pulse 82   Temp 98.2 F (36.8 C) (Oral)   Ht 1.6 m ('5\' 3"' )   Wt 66 kg   SpO2 95%   BMI 25.77 kg/m   Physical Exam Vitals and nursing note reviewed.  Constitutional:      General: She is not in acute distress.    Appearance: Normal appearance. She is well-developed. She is not ill-appearing or toxic-appearing.  HENT:     Head: Normocephalic and atraumatic.  Eyes:     Extraocular Movements: Extraocular movements intact.     Conjunctiva/sclera: Conjunctivae normal.     Pupils: Pupils are equal, round, and reactive to light.  Cardiovascular:     Rate and Rhythm: Normal rate and regular rhythm.     Heart sounds: No murmur.  Pulmonary:     Effort: Pulmonary effort is normal. No respiratory distress.     Breath sounds: Normal breath sounds.  Abdominal:     Palpations: Abdomen is soft.     Tenderness: There is abdominal tenderness.     Comments: Right upper quadrant tenderness.  May be some guarding.  Difficult to assess.  Musculoskeletal:        General: Normal range of motion.     Cervical back: Normal range of motion and neck supple.  Skin:    General: Skin is warm and dry.     Capillary Refill: Capillary refill takes less than 2 seconds.  Neurological:     General: No focal deficit present.     Mental Status: She is alert.     Comments: Patient moving all extremities.  Has some cognitive deficit probably some dementia but will communicate denies any symptoms.     ED Results  / Procedures / Treatments   Labs (all labs ordered are listed, but only abnormal results are displayed) Labs Reviewed  COMPREHENSIVE METABOLIC PANEL - Abnormal; Notable for the following components:      Result Value   CO2 21 (*)    Glucose, Bld 122 (*)    BUN 31 (*)    Creatinine, Ser 1.05 (*)    Calcium  8.5 (*)    Albumin 3.0 (*)    AST 103 (*)    ALT 81 (*)    GFR calc non Af Amer 46 (*)    GFR calc Af Amer 53 (*)    All other components within normal limits  CBC WITH DIFFERENTIAL/PLATELET - Abnormal; Notable for the following components:   RDW 16.6 (*)    Neutro Abs 8.5 (*)    All other components within normal limits  SARS CORONAVIRUS 2 (TAT 6-24 HRS)  LIPASE, BLOOD    EKG None  Radiology US Abdomen Limited  Result Date: 11/16/2019 CLINICAL DATA:  History of cholelithiasis with right upper quadrant pain. EXAM: ULTRASOUND ABDOMEN LIMITED RIGHT UPPER QUADRANT COMPARISON:  CT abdomen pelvis 11/13/2019. FINDINGS: Gallbladder: There are shadowing echogenic stones and sludge in the gallbladder. Gallbladder wall is thickened, 8 mm. Pericholecystic fluid is present. Positive sonographic Murphy sign. Common bile duct: Diameter: Poorly visualized due to patient condition and inability to breath hold. Liver: No focal lesion identified. Within normal limits in parenchymal echogenicity. Portal vein is patent on color Doppler imaging with normal direction of blood flow towards the liver. Other: None. IMPRESSION: Acute cholecystitis. Electronically Signed   By: Lorin Picket M.D.   On: 11/16/2019 16:04    Procedures Procedures (including critical care time)  Medications Ordered in ED Medications  0.9 %  sodium chloride infusion ( Intravenous New Bag/Given 11/16/19 1540)    ED Course  I have reviewed the triage vital signs and the nursing notes.  Pertinent labs & imaging results that were available during my care of the patient were reviewed by me and considered in my medical  decision making (see chart for details).    MDM Rules/Calculators/A&P                      Patient tender right upper quadrant.  Labs no leukocytosis.  Bilirubin not elevated.  Lipase normal.  Ultrasound done is consistent with cholecystitis.  Could be chronic.  Not able to visualize common bile duct well.  There is thickening of the gallbladder wall and there was a sonographic Murphy sign.  Also clinically she is tender as well.  Discussed with Dr. Arnoldo Morale once hospitalist admit her.  But he says she can be fed.  Does want her worked up to see whether they will place another cholecystostomy tube or where they will proceed from here.  Dr. Arnoldo Morale will continue to follow.  Liver enzymes up but alk phos and total bili normal.     Final Clinical Impression(s) / ED Diagnoses Final diagnoses:  Cholecystitis    Rx / DC Orders ED Discharge Orders    None       Fredia Sorrow, MD 11/16/19 1700

## 2019-11-17 DIAGNOSIS — K8012 Calculus of gallbladder with acute and chronic cholecystitis without obstruction: Principal | ICD-10-CM

## 2019-11-17 LAB — SARS CORONAVIRUS 2 (TAT 6-24 HRS): SARS Coronavirus 2: NEGATIVE

## 2019-11-17 LAB — CBC
HCT: 34.8 % — ABNORMAL LOW (ref 36.0–46.0)
Hemoglobin: 11.2 g/dL — ABNORMAL LOW (ref 12.0–15.0)
MCH: 30.4 pg (ref 26.0–34.0)
MCHC: 32.2 g/dL (ref 30.0–36.0)
MCV: 94.6 fL (ref 80.0–100.0)
Platelets: 243 10*3/uL (ref 150–400)
RBC: 3.68 MIL/uL — ABNORMAL LOW (ref 3.87–5.11)
RDW: 16.2 % — ABNORMAL HIGH (ref 11.5–15.5)
WBC: 9.6 10*3/uL (ref 4.0–10.5)
nRBC: 0 % (ref 0.0–0.2)

## 2019-11-17 LAB — COMPREHENSIVE METABOLIC PANEL
ALT: 75 U/L — ABNORMAL HIGH (ref 0–44)
AST: 66 U/L — ABNORMAL HIGH (ref 15–41)
Albumin: 2.5 g/dL — ABNORMAL LOW (ref 3.5–5.0)
Alkaline Phosphatase: 76 U/L (ref 38–126)
Anion gap: 9 (ref 5–15)
BUN: 26 mg/dL — ABNORMAL HIGH (ref 8–23)
CO2: 24 mmol/L (ref 22–32)
Calcium: 8.4 mg/dL — ABNORMAL LOW (ref 8.9–10.3)
Chloride: 112 mmol/L — ABNORMAL HIGH (ref 98–111)
Creatinine, Ser: 0.95 mg/dL (ref 0.44–1.00)
GFR calc Af Amer: >60 mL/min (ref >60)
GFR calc non Af Amer: 52 mL/min — ABNORMAL LOW (ref >60)
Glucose, Bld: 116 mg/dL — ABNORMAL HIGH (ref 70–99)
Potassium: 3.6 mmol/L (ref 3.5–5.1)
Sodium: 145 mmol/L (ref 135–145)
Total Bilirubin: 0.7 mg/dL (ref 0.3–1.2)
Total Protein: 6.4 g/dL — ABNORMAL LOW (ref 6.5–8.1)

## 2019-11-17 MED ORDER — SODIUM CHLORIDE 0.9 % IV SOLN
3.0000 g | Freq: Three times a day (TID) | INTRAVENOUS | Status: DC
  Administered 2019-11-17 – 2019-11-19 (×3): 3 g via INTRAVENOUS
  Filled 2019-11-17 (×9): qty 8

## 2019-11-17 NOTE — Consult Note (Signed)
Reason for Consult: Cholecystitis, cholelithiasis Referring Physician: Dr. Maricela Cynthia [name] is an 86 y.o. female.  HPI: Patient is a 86 year old white female with multiple medical problems including dementia with a known history of cholelithiasis.  I had seen her in the past and was felt that she was not an operative candidate.  She underwent cholecystostomy tube placement by interventional radiology for treatment of her cholecystitis.  She last had a cholecystostomy tube in place and around August 2020.  She recently began experiencing right upper quadrant abdominal pain.  CT scan of the abdomen revealed cholelithiasis with evidence of cholecystitis.  She was sent to the emergency room.  She was noted to have elevated liver enzyme tests but a normal white blood cell count.  Ultrasound the gallbladder revealed cholelithiasis with pericholecystic fluid present.  She was admitted to the hospital for further evaluation and treatment.  She was started on IV antibiotics.  This morning, she was eating breakfast.  She denied any nausea or vomiting.  She denies any abdominal pain.  She did answer basic questions appropriately.  I was not able to obtain a significant history or review of systems due to her dementia.  Past Medical History:  Diagnosis Date  . Breast cancer ([place])   . Cognitive communication deficit   . Dementia ([place])   . Difficulty in walking(719.7)   . Fall   . HTN (hypertension) 08/26/2016  . Muscle weakness (generalized)   . Rash and nonspecific skin eruption   . Thyroid disease    hypo  . Urinary tract infection, site not specified   . Vitamin B deficiency     Past Surgical History:  Procedure Laterality Date  . COLON SURGERY    . IR CATHETER TUBE CHANGE  05/07/2017  . IR EXCHANGE BILIARY DRAIN  07/02/2017  . IR EXCHANGE BILIARY DRAIN  07/10/2017  . IR EXCHANGE BILIARY DRAIN  08/28/2017  . IR EXCHANGE BILIARY DRAIN  10/11/2017  . IR EXCHANGE BILIARY DRAIN  10/28/2017  . IR  EXCHANGE BILIARY DRAIN  12/23/2017  . IR EXCHANGE BILIARY DRAIN  01/23/2018  . IR EXCHANGE BILIARY DRAIN  02/17/2018  . IR EXCHANGE BILIARY DRAIN  03/20/2018  . IR EXCHANGE BILIARY DRAIN  04/17/2018  . IR EXCHANGE BILIARY DRAIN  05/29/2018  . IR EXCHANGE BILIARY DRAIN  07/24/2018  . IR EXCHANGE BILIARY DRAIN  09/22/2018  . IR EXCHANGE BILIARY DRAIN  11/20/2018  . IR EXCHANGE BILIARY DRAIN  02/11/2019  . IR EXCHANGE BILIARY DRAIN  04/14/2019  . IR EXCHANGE BILIARY DRAIN  06/09/2019  . IR PERC CHOLECYSTOSTOMY  01/27/2017  . IR RADIOLOGIST EVAL & MGMT  03/05/2017  . MASTECTOMY, PARTIAL Right     Family History  Problem Relation Age of Onset  . Cancer Father        ? primary  . Diabetes Neg Hx   . Heart disease Neg Hx   . Stroke Neg Hx     Social History:  reports that she has never smoked. She has never used smokeless tobacco. She reports that she does not drink alcohol or use drugs.  Allergies: No Known Allergies  Medications:  I have reviewed the patient's current medications. Scheduled: . donepezil  10 mg Oral QHS    Results for orders placed or performed during the hospital encounter of 11/16/19 (from the past 48 hour(s))  Comprehensive metabolic panel     Status: Abnormal   Collection Time: 11/16/19  4:20 PM  Result Value Ref  Range   Sodium 138 135 - 145 mmol/L   Potassium 3.7 3.5 - 5.1 mmol/L   Chloride 105 98 - 111 mmol/L   CO2 21 (L) 22 - 32 mmol/L   Glucose, Bld 122 (H) 70 - 99 mg/dL   BUN 31 (H) 8 - 23 mg/dL   Creatinine, Ser 1.05 (H) 0.44 - 1.00 mg/dL   Calcium 8.5 (L) 8.9 - 10.3 mg/dL   Total Protein 7.0 6.5 - 8.1 g/dL   Albumin 3.0 (L) 3.5 - 5.0 g/dL   AST 103 (H) 15 - 41 U/L   ALT 81 (H) 0 - 44 U/L   Alkaline Phosphatase 84 38 - 126 U/L   Total Bilirubin 0.8 0.3 - 1.2 mg/dL   GFR calc non Af Amer 46 (L) >60 mL/min   GFR calc Af Amer 53 (L) >60 mL/min   Anion gap 12 5 - 15    Comment: Performed at [facility], [address] [place], [place] [address]  CBC with  Differential/Platelet     Status: Abnormal   Collection Time: 11/16/19  4:20 PM  Result Value Ref Range   WBC 10.3 4.0 - 10.5 K/uL   RBC 3.99 3.87 - 5.11 MIL/uL   Hemoglobin 12.1 12.0 - 15.0 g/dL   HCT 39.3 36.0 - 46.0 %   MCV 98.5 80.0 - 100.0 fL   MCH 30.3 26.0 - 34.0 pg   MCHC 30.8 30.0 - 36.0 g/dL   RDW 16.6 (H) 11.5 - 15.5 %   Platelets 233 150 - 400 K/uL   nRBC 0.0 0.0 - 0.2 %   Neutrophils Relative % 83 %   Neutro Abs 8.5 (H) 1.7 - 7.7 K/uL   Lymphocytes Relative 6 %   Lymphs Abs 0.7 0.7 - 4.0 K/uL   Monocytes Relative 10 %   Monocytes Absolute 1.0 0.1 - 1.0 K/uL   Eosinophils Relative 0 %   Eosinophils Absolute 0.0 0.0 - 0.5 K/uL   Basophils Relative 1 %   Basophils Absolute 0.1 0.0 - 0.1 K/uL   Immature Granulocytes 0 %   Abs Immature Granulocytes 0.04 0.00 - 0.07 K/uL    Comment: Performed at [facility], [address] [place], [place] [address]  Lipase, blood     Status: None   Collection Time: 11/16/19  4:20 PM  Result Value Ref Range   Lipase 28 11 - 51 U/L    Comment: Performed at [facility], [address], [place], [place] [address]  SARS CORONAVIRUS 2 (TAT 6-24 HRS) Nasopharyngeal Nasopharyngeal Swab     Status: None   Collection Time: 11/16/19  4:49 PM   Specimen: Nasopharyngeal Swab  Result Value Ref Range   SARS Coronavirus 2 NEGATIVE NEGATIVE    Comment: (NOTE) SARS-CoV-2 target nucleic acids are NOT DETECTED. The SARS-CoV-2 RNA is generally detectable in upper and lower respiratory specimens during the acute phase of infection. Negative results do not preclude SARS-CoV-2 infection, do not rule out co-infections with other pathogens, and should not be used as the sole basis for treatment or other patient management decisions. Negative results must be combined with clinical observations, patient history, and epidemiological information. The expected result is Negative. Fact Sheet for Patients: SugarRoll.be Fact Sheet  for Healthcare Providers: https://www.woods-mathews.com/ This test is not yet approved or cleared by the [place] FDA and  has been authorized for detection and/or diagnosis of SARS-CoV-2 by FDA under an Emergency Use Authorization (EUA). This EUA will remain  in effect (meaning  this test can be used) for the duration of the COVID-19 declaration under Section 56 4(b)(1) of the Act, 21 U.S.C. section 360bbb-3(b)(1), unless the authorization is terminated or revoked sooner. Performed at [facility], [place] [address], [place], [place] [address]   Comprehensive metabolic panel     Status: Abnormal   Collection Time: 11/17/19  6:34 AM  Result Value Ref Range   Sodium 145 135 - 145 mmol/L    Comment: DELTA CHECK NOTED   Potassium 3.6 3.5 - 5.1 mmol/L   Chloride 112 (H) 98 - 111 mmol/L   CO2 24 22 - 32 mmol/L   Glucose, Bld 116 (H) 70 - 99 mg/dL   BUN 26 (H) 8 - 23 mg/dL   Creatinine, Ser 0.95 0.44 - 1.00 mg/dL   Calcium 8.4 (L) 8.9 - 10.3 mg/dL   Total Protein 6.4 (L) 6.5 - 8.1 g/dL   Albumin 2.5 (L) 3.5 - 5.0 g/dL   AST 66 (H) 15 - 41 U/L   ALT 75 (H) 0 - 44 U/L   Alkaline Phosphatase 76 38 - 126 U/L   Total Bilirubin 0.7 0.3 - 1.2 mg/dL   GFR calc non Af Amer 52 (L) >60 mL/min   GFR calc Af Amer >60 >60 mL/min   Anion gap 9 5 - 15    Comment: Performed at [facility], [address] [place], [place] [address]  CBC     Status: Abnormal   Collection Time: 11/17/19  6:34 AM  Result Value Ref Range   WBC 9.6 4.0 - 10.5 K/uL   RBC 3.68 (L) 3.87 - 5.11 MIL/uL   Hemoglobin 11.2 (L) 12.0 - 15.0 g/dL   HCT 34.8 (L) 36.0 - 46.0 %   MCV 94.6 80.0 - 100.0 fL   MCH 30.4 26.0 - 34.0 pg   MCHC 32.2 30.0 - 36.0 g/dL   RDW 16.2 (H) 11.5 - 15.5 %   Platelets 243 150 - 400 K/uL   nRBC 0.0 0.0 - 0.2 %    Comment: Performed at [facility], [address], [place], [place] [address]    US Abdomen Limited  Result Date: 11/16/2019 CLINICAL DATA:  History of  cholelithiasis with right upper quadrant pain. EXAM: ULTRASOUND ABDOMEN LIMITED RIGHT UPPER QUADRANT COMPARISON:  CT abdomen pelvis 11/13/2019. FINDINGS: Gallbladder: There are shadowing echogenic stones and sludge in the gallbladder. Gallbladder wall is thickened, 8 mm. Pericholecystic fluid is present. Positive sonographic Murphy sign. Common bile duct: Diameter: Poorly visualized due to patient condition and inability to breath hold. Liver: No focal lesion identified. Within normal limits in parenchymal echogenicity. Portal vein is patent on color Doppler imaging with normal direction of blood flow towards the liver. Other: None. IMPRESSION: Acute cholecystitis. Electronically Signed   By: Lorin Picket M.D.   On: 11/16/2019 16:04   DG Chest Port 1 View  Result Date: 11/16/2019 CLINICAL DATA:  Pre-admission chest x-ray 4 cholecystitis. EXAM: PORTABLE CHEST 1 VIEW COMPARISON:  October 09, 2017 FINDINGS: Very mild linear atelectasis is seen within the right lung base. There is no evidence of a pleural effusion or pneumothorax. The heart size and mediastinal contours are within normal limits. There is moderate severity calcification of the aortic arch. Multilevel degenerative changes are seen throughout the thoracic spine. IMPRESSION: 1. Very mild right basilar linear atelectasis. Electronically Signed   By: Virgina Norfolk M.D.   On: 11/16/2019 17:17    ROS:  Review of systems not obtained due to patient  factors.  Blood pressure (!) 128/53, pulse 87, temperature 98.8 F (37.1 C), temperature source Oral, resp. rate 16, height 5\' 3"  (1.6 m), weight 64.4 kg, SpO2 93 %. Physical Exam: Pleasant white female no acute distress Head is normocephalic, atraumatic Eyes are without scleral icterus Lungs clear to auscultation with good breath sounds bilaterally Heart examination reveals a regular rate and rhythm without S3, S4, murmurs Abdomen is soft with exquisite tenderness in the right upper quadrant  to palpation.  No rigidity is noted.  Labs and x-ray reports reviewed Assessment/Plan: Impression: Acute cholecystitis, cholelithiasis Plan: Patient is not an operative candidate.  Recommend cholecystostomy tube per interventional radiology for further management.  Encourage that she does not have a leukocytosis and her liver enzyme tests are improving.  Discussed with Dr. Dyann Kief.  Aviva Signs 11/17/2019, 10:15 AM

## 2019-11-17 NOTE — Progress Notes (Signed)
PROGRESS NOTE    Cynthia [name]  [id number] DOB: 1924/12/13 DOA: 11/16/2019 PCP: Hennie Duos, MD     Brief Narrative:  Per H&P written by Dr. Denton Brick 11/16/2019 86 y.o. female with medical history significant for dementia, HTN, breast cancer. Patient was sent to the ED for further work-up of cholecystitis, as recommended by Dr. Arnoldo Morale.  Patient has a history of dementia, she is able to answer questions appropriately but not give me a detailed history.  She reports abdominal pain to the right upper abdomen.  No vomiting, no diarrhea.  No pain with urination.  In the past patient has required a cholecystostomy tube for her cholecystitis.  Patient had a CT scan 11/13/19 which showed persistently thickened gallbladder with numerous gallstones.  Concerning for acute on chronic cholecystitis.  Bile duct was not well-visualized  ED Course: Afebrile.  Stable vitals.  WBC 10.3.  Mildly elevated transaminases AST 103 ALT 81 with normal ALP and bilirubin.  Right upper quadrant ultrasound shows acute cholecystitis.  ED provider talked to Dr. Arnoldo Morale, at this time there are no plans for surgery intervention at this moment. Started on medical management and RUQ [place] ordered.    Assessment & Plan: 1-calculus of gallbladder with acute on chronic cholecystitis without obstruction -Mildly elevated LFTs -Right quadrant ultrasound corroborated concerns for calculus cholecystitis with pericholecystic fluid. -Continue IV antibiotics -Appreciate general surgery recommendations -IR has been consulted and planning for cholecystostomy tube placement on 11/18/2019 -Continue as needed analgesics and as needed antiemetics.  2-dementia -No behavioral disturbances appreciated currently -Continue constant reorientation and the use of donepezil  3-history of breast cancer -Status post partial right mastectomy -Currently in remission.  4-hypertension -current not on antihypertensive medication -Follow  vital signs  5-positive POC for Covid -Patient never develop acute disease process or received treatment for it. -Repeat PCR in this admission negative -Will continue [place] precautions but no isolation.  DVT prophylaxis: SCD's Code Status: Full code Family Communication: No family at bedside. Disposition Plan: Continue as needed analgesics, IV antibiotics and tomorrow (11/18/2019) will go for cholecystostomy tube placement by IR.  Consultants:   General surgery  Interventional radiology  Procedures:   See below for x-ray reports.  Antimicrobials:  Anti-infectives (From admission, onward)   Start     Dose/Rate Route Frequency Ordered Stop   11/17/19 0900  Ampicillin-Sulbactam (UNASYN) 3 g in sodium chloride 0.9 % 100 mL IVPB     3 g 200 mL/hr over 30 Minutes Intravenous Every 8 hours 11/17/19 0802     11/16/19 1730  cefTRIAXone (ROCEPHIN) 2 g in sodium chloride 0.9 % 100 mL IVPB  Status:  Discontinued     2 g 200 mL/hr over 30 Minutes Intravenous Every 24 hours 11/16/19 1706 11/17/19 0802   11/16/19 1715  metroNIDAZOLE (FLAGYL) IVPB 500 mg  Status:  Discontinued     500 mg 100 mL/hr over 60 Minutes Intravenous Every 8 hours 11/16/19 1706 11/17/19 0802     Subjective: Afebrile, no chest pain, no nausea, no vomiting.  Patient pleasantly confused complaining of right upper quadrant pain.  Objective: Vitals:   11/16/19 1815 11/16/19 1830 11/16/19 1952 11/17/19 0619  BP:  (!) 154/75 139/65 (!) 128/53  Pulse: 92  89 87  Resp: (!) 28 (!) 25 18 16   Temp:   98.2 F (36.8 C) 98.8 F (37.1 C)  TempSrc:   Oral Oral  SpO2: 96%  96% 93%  Weight:   64.4 kg   Height:   5'  3" (1.6 m)     Intake/Output Summary (Last 24 hours) at 11/17/2019 1651 Last data filed at 11/17/2019 0900 Gross per 24 hour  Intake 742.1 ml  Output --  Net 742.1 ml   Filed Weights   11/16/19 1442 11/16/19 1952  Weight: 66 kg 64.4 kg    Examination: General exam: Alert, awake, oriented x 2;  pleasantly confused; no fever, no nausea, no vomiting, no chest pain.  Still complaining of significant abdominal discomfort. Respiratory system: Clear to auscultation. Respiratory effort normal.  Good oxygen saturation on room air. Cardiovascular system:Rate controlled, no rubs, no gallops, no JVD. Gastrointestinal system: Abdomen is nondistended, soft and exquisitely tender to palpation on her right upper quadrant.  Nontender. No organomegaly or masses felt. Normal bowel sounds heard. Central nervous system: Alert and oriented X 2. No focal neurological deficits. Extremities: No cyanosis or clubbing. Skin: No rashes, no petechiae. Psychiatry: Mood & affect appropriate.  Patient insight and situation appears impaired with chronic dementia; able to follow commands properly.    Data Reviewed: I have personally reviewed following labs and imaging studies  CBC: Recent Labs  Lab 11/16/19 1620 11/17/19 0634  WBC 10.3 9.6  NEUTROABS 8.5*  --   HGB 12.1 11.2*  HCT 39.3 34.8*  MCV 98.5 94.6  PLT 233 0000000   Basic Metabolic Panel: Recent Labs  Lab 11/16/19 1620 11/17/19 0634  NA 138 145  K 3.7 3.6  CL 105 112*  CO2 21* 24  GLUCOSE 122* 116*  BUN 31* 26*  CREATININE 1.05* 0.95  CALCIUM 8.5* 8.4*   GFR: Estimated Creatinine Clearance: 34.1 mL/min (by C-G formula based on SCr of 0.95 mg/dL).   Liver Function Tests: Recent Labs  Lab 11/16/19 1620 11/17/19 0634  AST 103* 66*  ALT 81* 75*  ALKPHOS 84 76  BILITOT 0.8 0.7  PROT 7.0 6.4*  ALBUMIN 3.0* 2.5*   Recent Labs  Lab 11/16/19 1620  LIPASE 28   Urine analysis:    Component Value Date/Time   COLORURINE YELLOW 10/09/2017 0926   APPEARANCEUR CLEAR 10/09/2017 0926   LABSPEC 1.016 10/09/2017 0926   PHURINE 5.0 10/09/2017 0926   GLUCOSEU NEGATIVE 10/09/2017 0926   HGBUR NEGATIVE 10/09/2017 0926   BILIRUBINUR NEGATIVE 10/09/2017 0926   KETONESUR NEGATIVE 10/09/2017 0926   PROTEINUR NEGATIVE 10/09/2017 0926    UROBILINOGEN 0.2 12/08/2013 1848   NITRITE NEGATIVE 10/09/2017 0926   LEUKOCYTESUR NEGATIVE 10/09/2017 0926    Recent Results (from the past 240 hour(s))  SARS CORONAVIRUS 2 (TAT 6-24 HRS) Nasopharyngeal Nasopharyngeal Swab     Status: None   Collection Time: 11/16/19  4:49 PM   Specimen: Nasopharyngeal Swab  Result Value Ref Range Status   SARS Coronavirus 2 NEGATIVE NEGATIVE Final    Comment: (NOTE) SARS-CoV-2 target nucleic acids are NOT DETECTED. The SARS-CoV-2 RNA is generally detectable in upper and lower respiratory specimens during the acute phase of infection. Negative results do not preclude SARS-CoV-2 infection, do not rule out co-infections with other pathogens, and should not be used as the sole basis for treatment or other patient management decisions. Negative results must be combined with clinical observations, patient history, and epidemiological information. The expected result is Negative. Fact Sheet for Patients: SugarRoll.be Fact Sheet for Healthcare Providers: https://www.woods-mathews.com/ This test is not yet approved or cleared by the [place] FDA and  has been authorized for detection and/or diagnosis of SARS-CoV-2 by FDA under an Emergency Use Authorization (EUA). This EUA will remain  in effect (  meaning this test can be used) for the duration of the COVID-19 declaration under Section 56 4(b)(1) of the Act, 21 U.S.C. section 360bbb-3(b)(1), unless the authorization is terminated or revoked sooner. Performed at [facility], [place] [address], [place], [place] [address]       Radiology Studies: US Abdomen Limited  Result Date: 11/16/2019 CLINICAL DATA:  History of cholelithiasis with right upper quadrant pain. EXAM: ULTRASOUND ABDOMEN LIMITED RIGHT UPPER QUADRANT COMPARISON:  CT abdomen pelvis 11/13/2019. FINDINGS: Gallbladder: There are shadowing echogenic stones and sludge in the gallbladder.  Gallbladder wall is thickened, 8 mm. Pericholecystic fluid is present. Positive sonographic Murphy sign. Common bile duct: Diameter: Poorly visualized due to patient condition and inability to breath hold. Liver: No focal lesion identified. Within normal limits in parenchymal echogenicity. Portal vein is patent on color Doppler imaging with normal direction of blood flow towards the liver. Other: None. IMPRESSION: Acute cholecystitis. Electronically Signed   By: Lorin Picket M.D.   On: 11/16/2019 16:04   DG Chest Port 1 View  Result Date: 11/16/2019 CLINICAL DATA:  Pre-admission chest x-ray 4 cholecystitis. EXAM: PORTABLE CHEST 1 VIEW COMPARISON:  October 09, 2017 FINDINGS: Very mild linear atelectasis is seen within the right lung base. There is no evidence of a pleural effusion or pneumothorax. The heart size and mediastinal contours are within normal limits. There is moderate severity calcification of the aortic arch. Multilevel degenerative changes are seen throughout the thoracic spine. IMPRESSION: 1. Very mild right basilar linear atelectasis. Electronically Signed   By: Virgina Norfolk M.D.   On: 11/16/2019 17:17    Scheduled Meds: . donepezil  10 mg Oral QHS   Continuous Infusions: . ampicillin-sulbactam (UNASYN) IV 3 g (11/17/19 1010)     LOS: 1 day    Time spent: 30 minutes.    Barton Dubois, MD [facility] Pager [phone]   11/17/2019, 4:51 PM

## 2019-11-17 NOTE — Progress Notes (Signed)
   Known Hx cholelithiasis not operative candidate per Dr Arnoldo Morale Has had previous chole drain 01/2017 - 05/2019 Accidentally removed at [facility] 06/24/19 Not replaced per Dr Constance Haw per notes  New RUQ pain; N/V ED eval reveals elevated LFTs   CT 11/13/19: IMPRESSION: 1. Persistently thickened gallbladder wall with numerous gallstones, including one at the level of the gallbladder neck. Surrounding pericholecystic fat stranding without a well-defined fluid collection or evidence of abscess. Findings are concerning for acute on chronic cholecystitis. Consider nuclear medicine hepatobiliary scan to evaluate patency of the cystic and common bile ducts. 2. Extensive colonic diverticulosis without evidence of Diverticulitis.  [place]: IMPRESSION: Acute cholecystitis  Request for percutaneous cholecystostomy drain placement per Dr Arnoldo Morale  Imaging reviewed with Dr Redgie Grayer procedure Will plan for 1/27 in IR [facility]  See orders

## 2019-11-17 NOTE — Progress Notes (Signed)
Pt up in chair for supper meal. Oriented to person only at this time. Ambulated from bed to bathroom with 2 assist, urinated clear yellow urine,  then into chair. Gait slightly unsteady, pt impulsive at times with her movements Chair alarm on. Pt denies c/o at present.

## 2019-11-18 ENCOUNTER — Ambulatory Visit (HOSPITAL_COMMUNITY)
Admission: RE | Admit: 2019-11-18 | Discharge: 2019-11-18 | Disposition: A | Discharge status: Home / Self Care | Payer: Medicare Other | Source: Ambulatory Visit | Attending: Internal Medicine | Admitting: Internal Medicine

## 2019-11-18 ENCOUNTER — Inpatient Hospital Stay (HOSPITAL_COMMUNITY): Admit: 2019-11-18 | Payer: Medicare Other

## 2019-11-18 DIAGNOSIS — E539 Vitamin B deficiency, unspecified: Secondary | ICD-10-CM | POA: Insufficient documentation

## 2019-11-18 DIAGNOSIS — K8012 Calculus of gallbladder with acute and chronic cholecystitis without obstruction: Secondary | ICD-10-CM | POA: Insufficient documentation

## 2019-11-18 DIAGNOSIS — I10 Essential (primary) hypertension: Secondary | ICD-10-CM | POA: Insufficient documentation

## 2019-11-18 DIAGNOSIS — F0391 Unspecified dementia with behavioral disturbance: Secondary | ICD-10-CM

## 2019-11-18 DIAGNOSIS — K819 Cholecystitis, unspecified: Secondary | ICD-10-CM | POA: Diagnosis not present

## 2019-11-18 DIAGNOSIS — Z79899 Other long term (current) drug therapy: Secondary | ICD-10-CM | POA: Insufficient documentation

## 2019-11-18 DIAGNOSIS — E079 Disorder of thyroid, unspecified: Secondary | ICD-10-CM | POA: Insufficient documentation

## 2019-11-18 DIAGNOSIS — F0390 Unspecified dementia without behavioral disturbance: Secondary | ICD-10-CM | POA: Insufficient documentation

## 2019-11-18 DIAGNOSIS — Z8616 Personal history of COVID-19: Secondary | ICD-10-CM | POA: Insufficient documentation

## 2019-11-18 HISTORY — PX: IR PERC CHOLECYSTOSTOMY: IMG2326

## 2019-11-18 LAB — PROTIME-INR
INR: 1.4 — ABNORMAL HIGH (ref 0.8–1.2)
Prothrombin Time: 16.7 seconds — ABNORMAL HIGH (ref 11.4–15.2)

## 2019-11-18 MED ORDER — IOHEXOL 300 MG/ML  SOLN: 50.0000 mL | Freq: Once | INTRAMUSCULAR | Status: DC | PRN

## 2019-11-18 MED ORDER — LIDOCAINE HCL 1 % IJ SOLN
INTRAMUSCULAR | Status: DC | PRN
  Administered 2019-11-18: 3 mL

## 2019-11-18 MED ORDER — MIDAZOLAM HCL 2 MG/2ML IJ SOLN
INTRAMUSCULAR | Status: AC
  Filled 2019-11-18: qty 2

## 2019-11-18 MED ORDER — FENTANYL CITRATE (PF) 100 MCG/2ML IJ SOLN
INTRAMUSCULAR | Status: DC | PRN
  Administered 2019-11-18 (×2): 25 ug via INTRAVENOUS

## 2019-11-18 MED ORDER — MIDAZOLAM HCL 2 MG/2ML IJ SOLN
INTRAMUSCULAR | Status: DC | PRN
  Administered 2019-11-18: 1 mg via INTRAVENOUS
  Administered 2019-11-18: 0.5 mg via INTRAVENOUS

## 2019-11-18 MED ORDER — FENTANYL CITRATE (PF) 100 MCG/2ML IJ SOLN
INTRAMUSCULAR | Status: AC
  Filled 2019-11-18: qty 2

## 2019-11-18 MED ORDER — LIDOCAINE HCL 1 % IJ SOLN
INTRAMUSCULAR | Status: AC
  Filled 2019-11-18: qty 20

## 2019-11-18 MED ORDER — HALOPERIDOL LACTATE 5 MG/ML IJ SOLN: 5.0000 mg | Freq: Four times a day (QID) | INTRAMUSCULAR | Status: DC | PRN

## 2019-11-18 NOTE — Progress Notes (Signed)
Pt pulled out IV at the end of day shift. Nurse went in to start IV, and pt stated she was not getting an IV.  Pt stated, "The doctor doesn't need to tell me what to do"

## 2019-11-18 NOTE — Progress Notes (Signed)
EMS transport here to take pt over to [facility] for Cholecystotomy. Transfer paperwork in order and report received by the Lead RN / EMT. Pt has no complaints of this time.

## 2019-11-18 NOTE — Care Management Important Message (Signed)
Important Message  Patient Details  Name: Cynthia [name] MRN: [id number] Date of Birth: 01/12/1925   Medicare Important Message Given:  Yes(mailed to daughter at address on file)     Tommy Medal 11/18/2019, 1:25 PM

## 2019-11-18 NOTE — Discharge Instructions (Signed)
[facility] Care Surgical drains are used to remove extra fluid that normally builds up in a surgical wound after surgery. A surgical drain helps to heal a surgical wound. Different kinds of surgical drains include:  Active drains. These drains use suction to pull drainage away from the surgical wound. Drainage flows through a tube to a container outside of the body. With these drains, you need to keep the bulb or the drainage container flat (compressed) at all times, except while you empty it. Flattening the bulb or container creates suction.  Passive drains. These drains allow fluid to drain naturally, by gravity. Drainage flows through a tube to a bandage (dressing) or a container outside of the body. Passive drains do not need to be emptied. A drain is placed during surgery. Right after surgery, drainage is usually bright red and a little thicker than water. The drainage may gradually turn yellow or pink and become thinner. It is likely that your health care provider will remove the drain when the drainage stops or when the amount decreases to 1-2 Tbsp (15-30 mL) during a 24-hour period. Supplies needed:  Tape.  Germ-free cleaning solution (sterile saline).  Cotton swabs.  Split gauze drain sponge: 4 x 4 inches (10 x 10 cm).  Gauze square: 4 x 4 inches (10 x 10 cm). How to care for your surgical drain Care for your drain as told by your health care provider. This is important to help prevent infection. If your drain is placed at your back, or any other hard-to-reach area, ask another person to assist you in performing the following tasks: General care  Keep the skin around the drain dry and covered with a dressing at all times.  Check your drain area every day for signs of infection. Check for: ? Redness, swelling, or pain. ? Pus or a bad smell. ? Cloudy drainage. ? Tenderness or pressure at the drain exit site. Changing the dressing Follow instructions from your  health care provider about how to change your dressing. Change your dressing at least once a day. Change it more often if needed to keep the dressing dry. Make sure you: 1. Gather your supplies. 2. Wash your hands with soap and water before you change your dressing. If soap and water are not available, use hand sanitizer. 3. Remove the old dressing. Avoid using scissors to do that. 4. Wash your hands with soap and water again after removing the old dressing. 5. Use sterile saline to clean your skin around the drain. You may need to use a cotton swab to clean the skin. 6. Place the tube through the slit in a drain sponge. Place the drain sponge so that it covers your wound. 7. Place the gauze square or another drain sponge on top of the drain sponge that is on the wound. Make sure the tube is between those layers. 8. Tape the dressing to your skin. 9. Tape the drainage tube to your skin 1-2 inches (2.5-5 cm) below the place where the tube enters your body. Taping keeps the tube from pulling on any stitches (sutures) that you have. 10. Wash your hands with soap and water. 11. Write down the color of your drainage and how often you change your dressing. How to empty your active drain  1. Make sure that you have a measuring cup that you can empty your drainage into. 2. Wash your hands with soap and water. If soap and water are not available, use hand sanitizer.  3. Loosen any pins or clips that hold the tube in place. 4. If your health care provider tells you to strip the tube to prevent clots and tube blockages: ? Hold the tube at the skin with one hand. Use your other hand to pinch the tubing with your thumb and first finger. ? Gently move your fingers down the tube while squeezing very lightly. This clears any drainage, clots, or tissue from the tube. ? You may need to do this several times each day to keep the tube clear. Do not pull on the tube. 5. Open the bulb cap or the drain plug. Do not  touch the inside of the cap or the bottom of the plug. 6. Turn the device upside down and gently squeeze. 7. Empty all of the drainage into the measuring cup. 8. Compress the bulb or the container and replace the cap or the plug. To compress the bulb or the container, squeeze it firmly in the middle while you close the cap or plug the container. 9. Write down the amount of drainage that you have in each 24-hour period. If you have less than 2 Tbsp (30 mL) of drainage during 24 hours, contact your health care provider. 10. Flush the drainage down the toilet. 11. Wash your hands with soap and water. Contact a health care provider if:  You have redness, swelling, or pain around your drain area.  You have pus or a bad smell coming from your drain area.  You have a fever or chills.  The skin around your drain is warm to the touch.  The amount of drainage that you have is increasing instead of decreasing.  You have drainage that is cloudy.  There is a sudden stop or a sudden decrease in the amount of drainage that you have.  Your drain tube falls out.  Your active drain does not stay compressed after you empty it. Summary  Surgical drains are used to remove extra fluid that normally builds up in a surgical wound after surgery.  Different kinds of surgical drains include active drains and passive drains. Active drains use suction to pull drainage away from the surgical wound, and passive drains allow fluid to drain naturally.  It is important to care for your drain to prevent infection. If your drain is placed at your back, or any other hard-to-reach area, ask another person to assist you.  Contact your health care provider if you have redness, swelling, or pain around your drain area. This information is not intended to replace advice given to you by your health care provider. Make sure you discuss any questions you have with your health care provider. Document Revised: 11/12/2018  Document Reviewed: 11/12/2018 Elsevier Patient Education  [place]. Moderate Conscious Sedation, Adult, Care After These instructions provide you with information about caring for yourself after your procedure. Your health care provider may also give you more specific instructions. Your treatment has been planned according to current medical practices, but problems sometimes occur. Call your health care provider if you have any problems or questions after your procedure. What can I expect after the procedure? After your procedure, it is common:  To feel sleepy for several hours.  To feel clumsy and have poor balance for several hours.  To have poor judgment for several hours.  To vomit if you eat too soon. Follow these instructions at home: For at least 24 hours after the procedure:   Do not: ? Participate in activities where  you could fall or become injured. ? Drive. ? Use heavy machinery. ? Drink alcohol. ? Take sleeping pills or medicines that cause drowsiness. ? Make important decisions or sign legal documents. ? Take care of children on your own.  Rest. Eating and drinking  Follow the diet recommended by your health care provider.  If you vomit: ? Drink water, juice, or soup when you can drink without vomiting. ? Make sure you have little or no nausea before eating solid foods. General instructions  Have a responsible adult stay with you until you are awake and alert.  Take over-the-counter and prescription medicines only as told by your health care provider.  If you smoke, do not smoke without supervision.  Keep all follow-up visits as told by your health care provider. This is important. Contact a health care provider if:  You keep feeling nauseous or you keep vomiting.  You feel light-headed.  You develop a rash.  You have a fever. Get help right away if:  You have trouble breathing. This information is not intended to replace advice given to  you by your health care provider. Make sure you discuss any questions you have with your health care provider. Document Revised: 09/20/2017 Document Reviewed: 01/28/2016 Elsevier Patient Education  2020 [employer].

## 2019-11-18 NOTE — Progress Notes (Signed)
Pt returned from [facility] at 1615 s/p Cholecystotomy. Report received from [facility]. See assessment. Will con't to monitor and maintain safety / comfort.

## 2019-11-18 NOTE — Progress Notes (Signed)
PROGRESS NOTE  Cynthia [name] [id number] DOB: 1925/02/02 DOA: 11/16/2019 PCP: Hennie Duos, MD  Brief History:  86 year old female with a history of dementia, hypertension, breast cancer, cholecystitis presenting from [facility] secondary to abdominal pain for approximately 3 to 4 days.  The patient had a CT of the abdomen and pelvis on 11/13/2019 which showed persistently thickened gallbladder with numerous gallstones including one at the level of the gallbladder neck.  There was surrounding pericholecystic fluid and fat stranding without well-defined abscess.  As result, the patient was sent to the emergency department for further evaluation.  Initial WBC was 7.3 with AST 103, ALT 81, alkaline phosphatase 84, total bilirubin 0.8, lipase 28.  Right upper quadrant ultrasound showed echogenic stones, sludge in the gallbladder, thickened gallbladder wall, pericholecystic fluid.  General surgery was consulted.  She was not felt to be a good surgical candidate.  IR was consulted for cholecystotomy tube placement.  Assessment/Plan: Acute on chronic cholecystitis/cholelithiasis -Case discussed with general surgery, Dr. Arnoldo Morale -11/18/2019--planning for a cholecystotomy tube -Patient had previous cholecystotomy tube which was exchanged on 06/09/2019 -Continue antibiotics IV  Dementia with behavioral disturbance -The patient has had episodes of agitation since hospitalization consistent with hospital delirium -Haldol as needed agitation -Continue Aricept -11/18/19--pt refused labs  Essential hypertension -Not on any medications presently -BP is acceptable  Chronic constipation -miralax prn      Disposition Plan:   SNF 1-2 days  Family Communication:  No Family at bedside  Consultants:  General surgery Code Status:  FULL  DVT Prophylaxis:  SCDs   Procedures: As Listed in Progress Note Above  Antibiotics: Unasyn 1/26>>> Ceftriaxone/flagyl  1/25     Subjective: Patient is pleasantly confused.  Review of systems is limited secondary to her encephalopathy.  She denies any chest pain, shortness breath, abdominal pain.  There is no reports of vomiting, diarrhea, respiratory distress.  She had agitation overnight refusing antibiotics and pulling out her IV.  Objective: Vitals:   11/16/19 1952 11/17/19 0619 11/17/19 2026 11/18/19 0300  BP: 139/65 (!) 128/53 (!) 133/51 (!) 131/56  Pulse: 89 87 90 89  Resp: 18 16 20 16   Temp: 98.2 F (36.8 C) 98.8 F (37.1 C) 98.1 F (36.7 C) 98.9 F (37.2 C)  TempSrc: Oral Oral Oral Axillary  SpO2: 96% 93% 95% 94%  Weight: 64.4 kg     Height: 5\' 3"  (1.6 m)       Intake/Output Summary (Last 24 hours) at 11/18/2019 [id number] Last data filed at 11/17/2019 0900 Gross per 24 hour  Intake 120 ml  Output --  Net 120 ml   Weight change:  Exam:   General:  Pt is alert, does not follow commands appropriately, not in acute distress  HEENT: No icterus, No thrush, No neck mass, [place]/AT  Cardiovascular: RRR, S1/S2, no rubs, no gallops  Respiratory: Bibasilar crackles but no wheezing.  Good air movement  Abdomen: Soft/+BS, diffuse tender with further touch, non distended, no guarding  Extremities: No edema, No lymphangitis, No petechiae, No rashes, no synovitis   Data Reviewed: I have personally reviewed following labs and imaging studies Basic Metabolic Panel: Recent Labs  Lab 11/16/19 1620 11/17/19 0634  NA 138 145  K 3.7 3.6  CL 105 112*  CO2 21* 24  GLUCOSE 122* 116*  BUN 31* 26*  CREATININE 1.05* 0.95  CALCIUM 8.5* 8.4*   Liver Function Tests: Recent Labs  Lab 11/16/19 1620 11/17/19  0634  AST 103* 66*  ALT 81* 75*  ALKPHOS 84 76  BILITOT 0.8 0.7  PROT 7.0 6.4*  ALBUMIN 3.0* 2.5*   Recent Labs  Lab 11/16/19 1620  LIPASE 28   No results for input(s): AMMONIA in the last 168 hours. Coagulation Profile: Recent Labs  Lab 11/18/19 0536  INR 1.4*   CBC: Recent  Labs  Lab 11/16/19 1620 11/17/19 0634  WBC 10.3 9.6  NEUTROABS 8.5*  --   HGB 12.1 11.2*  HCT 39.3 34.8*  MCV 98.5 94.6  PLT 233 243   Cardiac Enzymes: No results for input(s): CKTOTAL, CKMB, CKMBINDEX, TROPONINI in the last 168 hours. BNP: Invalid input(s): POCBNP CBG: No results for input(s): GLUCAP in the last 168 hours. HbA1C: No results for input(s): HGBA1C in the last 72 hours. Urine analysis:    Component Value Date/Time   COLORURINE YELLOW 10/09/2017 0926   APPEARANCEUR CLEAR 10/09/2017 0926   LABSPEC 1.016 10/09/2017 0926   PHURINE 5.0 10/09/2017 0926   GLUCOSEU NEGATIVE 10/09/2017 0926   HGBUR NEGATIVE 10/09/2017 0926   BILIRUBINUR NEGATIVE 10/09/2017 0926   KETONESUR NEGATIVE 10/09/2017 0926   PROTEINUR NEGATIVE 10/09/2017 0926   UROBILINOGEN 0.2 12/08/2013 1848   NITRITE NEGATIVE 10/09/2017 0926   LEUKOCYTESUR NEGATIVE 10/09/2017 0926   Sepsis Labs: @LABRCNTIP (procalcitonin:4,lacticidven:4) ) Recent Results (from the past 240 hour(s))  SARS CORONAVIRUS 2 ([place] 6-24 HRS) Nasopharyngeal Nasopharyngeal Swab     Status: None   Collection Time: 11/16/19  4:49 PM   Specimen: Nasopharyngeal Swab  Result Value Ref Range Status   SARS Coronavirus 2 NEGATIVE NEGATIVE Final    Comment: (NOTE) SARS-CoV-2 target nucleic acids are NOT DETECTED. The SARS-CoV-2 RNA is generally detectable in upper and lower respiratory specimens during the acute phase of infection. Negative results do not preclude SARS-CoV-2 infection, do not rule out co-infections with other pathogens, and should not be used as the sole basis for treatment or other patient management decisions. Negative results must be combined with clinical observations, patient history, and epidemiological information. The expected result is Negative. Fact Sheet for Patients: SugarRoll.be Fact Sheet for Healthcare Providers: https://www.woods-mathews.com/ This test is not  yet approved or cleared by the [place] FDA and  has been authorized for detection and/or diagnosis of SARS-CoV-2 by FDA under an Emergency Use Authorization (EUA). This EUA will remain  in effect (meaning this test can be used) for the duration of the COVID-19 declaration under Section 56 4(b)(1) of the Act, 21 U.S.C. section 360bbb-3(b)(1), unless the authorization is terminated or revoked sooner. Performed at [facility], [place] [address], [place], [place] [address]      Scheduled Meds: . donepezil  10 mg Oral QHS   Continuous Infusions: . ampicillin-sulbactam (UNASYN) IV 3 g (11/17/19 1010)    Procedures/Studies: CT ABDOMEN PELVIS WO CONTRAST  Result Date: 11/13/2019 CLINICAL DATA:  Right-sided abdominal pain EXAM: CT ABDOMEN AND PELVIS WITHOUT CONTRAST TECHNIQUE: Multidetector CT imaging of the abdomen and pelvis was performed following the standard protocol without IV contrast. COMPARISON:  08/26/2019 FINDINGS: Lower chest: Unchanged 4 mm nodule within the medial aspect of the left lower lobe (series 4, image 18). 4 mm right lower lobe nodule (series 4, image 10) is also unchanged. No new or acute findings within the visualized lung bases. Hepatobiliary: Gallbladder is mildly dilated with circumferential gallbladder wall thickening and multiple hyperdense gallstones both in the dependent portions of the gallbladder as well as within the gallbladder neck (series 2, image 24).  There is surrounding pericholecystic fat stranding and trace fluid. No well-defined fluid collection or abscess. No pneumoperitoneum. Liver is unremarkable. No intrahepatic biliary dilatation. Pancreas: Unremarkable. No pancreatic ductal dilatation or surrounding inflammatory changes. Spleen: Normal in size without focal abnormality. Adrenals/Urinary Tract: Unremarkable adrenal glands. Right renal atrophy and scarring with nonobstructing 1-2 mm calculus. Left kidney within normal limits. Ureters are  nondilated. Urinary bladder is incompletely distended but appears otherwise unremarkable. Stomach/Bowel: Postsurgical changes to the bowel with anastomotic suture in the right lower quadrant. Extensive colonic diverticulosis. No pericolonic inflammatory changes. No evidence of bowel obstruction. Small hiatal hernia. Redemonstrated duodenal diverticulum. Vascular/Lymphatic: Aortic atherosclerosis. No enlarged abdominal or pelvic lymph nodes. Reproductive: Uterus and bilateral adnexa are unremarkable. Other: Pessary in place. Rectus diastasis. Numerous surgical clips in the right upper abdomen. Musculoskeletal: No acute or significant osseous findings. Previously seen tract from prior cholecystostomy tube appears well healed. IMPRESSION: 1. Persistently thickened gallbladder wall with numerous gallstones, including one at the level of the gallbladder neck. Surrounding pericholecystic fat stranding without a well-defined fluid collection or evidence of abscess. Findings are concerning for acute on chronic cholecystitis. Consider nuclear medicine hepatobiliary scan to evaluate patency of the cystic and common bile ducts. 2. Extensive colonic diverticulosis without evidence of diverticulitis. Aortic Atherosclerosis (ICD10-I70.0). These results will be called to the ordering clinician or representative by the Radiologist Assistant, and communication documented in the PACS or zVision Dashboard. Electronically Signed   By: Davina Poke D.O.   On: 11/13/2019 15:58   US Abdomen Limited  Result Date: 11/16/2019 CLINICAL DATA:  History of cholelithiasis with right upper quadrant pain. EXAM: ULTRASOUND ABDOMEN LIMITED RIGHT UPPER QUADRANT COMPARISON:  CT abdomen pelvis 11/13/2019. FINDINGS: Gallbladder: There are shadowing echogenic stones and sludge in the gallbladder. Gallbladder wall is thickened, 8 mm. Pericholecystic fluid is present. Positive sonographic Murphy sign. Common bile duct: Diameter: Poorly visualized  due to patient condition and inability to breath hold. Liver: No focal lesion identified. Within normal limits in parenchymal echogenicity. Portal vein is patent on color Doppler imaging with normal direction of blood flow towards the liver. Other: None. IMPRESSION: Acute cholecystitis. Electronically Signed   By: Lorin Picket M.D.   On: 11/16/2019 16:04   DG Chest Port 1 View  Result Date: 11/16/2019 CLINICAL DATA:  Pre-admission chest x-ray 4 cholecystitis. EXAM: PORTABLE CHEST 1 VIEW COMPARISON:  October 09, 2017 FINDINGS: Very mild linear atelectasis is seen within the right lung base. There is no evidence of a pleural effusion or pneumothorax. The heart size and mediastinal contours are within normal limits. There is moderate severity calcification of the aortic arch. Multilevel degenerative changes are seen throughout the thoracic spine. IMPRESSION: 1. Very mild right basilar linear atelectasis. Electronically Signed   By: Virgina Norfolk M.D.   On: 11/16/2019 17:17    Orson Eva, DO  [facility] Pager [phone]  If 7PM-7AM, please contact night-coverage [email or web address] Password [email or web address] 11/18/2019, 8:14 AM   LOS: 2 days

## 2019-11-18 NOTE — Progress Notes (Signed)
Pt finally agreed to getting an IV at this time.

## 2019-11-18 NOTE — Progress Notes (Signed)
Chief Complaint: Patient was seen in consultation today for cholecystitis, cholelithiasis   Referring Physician(s): [place]  Supervising Physician: Daryll Brod  Patient Status: [facility] - Out-pt  History of Present Illness: Cynthia [name] is a 86 y.o. female with past medical history of HTN, dementia, and cholelithiasis known to IR from previous cholecystostomy tube placement and routine exchanges who presented to [facility] with RUQ pain.  Patient with increased dementia and recent COVID infection.  Per her daughter, reportedly pulled the tube out by accident.  Decision was made at that time to trial liberation from the tube, however she now returns with acute cholecystitis and IR has been consulted for tube replacement.  The daughter believes the tube has been out for at least 1 month.   Patient arrives to Radiology from [facility] today confused.  She states she recognizes where she is as she has been here several times.  She is able to tell me that her "belly only hurts when someone is pushing on it."  She is tender to palpation in the RUQ.  Her tract is well-healed.  She has been set up as a new placement today with sedation.  She has been NPO. She is not currently on blood thinners.   Past Medical History:  Diagnosis Date  . Breast cancer ([place])   . Cognitive communication deficit   . Dementia ([place])   . Difficulty in walking(719.7)   . Fall   . HTN (hypertension) 08/26/2016  . Muscle weakness (generalized)   . Rash and nonspecific skin eruption   . Thyroid disease    hypo  . Urinary tract infection, site not specified   . Vitamin B deficiency     Past Surgical History:  Procedure Laterality Date  . COLON SURGERY    . IR CATHETER TUBE CHANGE  05/07/2017  . IR EXCHANGE BILIARY DRAIN  07/02/2017  . IR EXCHANGE BILIARY DRAIN  07/10/2017  . IR EXCHANGE BILIARY DRAIN  08/28/2017  . IR EXCHANGE BILIARY DRAIN  10/11/2017  . IR EXCHANGE BILIARY DRAIN  10/28/2017  . IR EXCHANGE BILIARY  DRAIN  12/23/2017  . IR EXCHANGE BILIARY DRAIN  01/23/2018  . IR EXCHANGE BILIARY DRAIN  02/17/2018  . IR EXCHANGE BILIARY DRAIN  03/20/2018  . IR EXCHANGE BILIARY DRAIN  04/17/2018  . IR EXCHANGE BILIARY DRAIN  05/29/2018  . IR EXCHANGE BILIARY DRAIN  07/24/2018  . IR EXCHANGE BILIARY DRAIN  09/22/2018  . IR EXCHANGE BILIARY DRAIN  11/20/2018  . IR EXCHANGE BILIARY DRAIN  02/11/2019  . IR EXCHANGE BILIARY DRAIN  04/14/2019  . IR EXCHANGE BILIARY DRAIN  06/09/2019  . IR PERC CHOLECYSTOSTOMY  01/27/2017  . IR RADIOLOGIST EVAL & MGMT  03/05/2017  . MASTECTOMY, PARTIAL Right     Allergies: Patient has no known allergies.  Medications: Prior to Admission medications   Medication Sig Start Date End Date Taking? Authorizing Provider  acetaminophen (TYLENOL) 325 MG tablet Take 650 mg by mouth every 4 (four) hours as needed.    [provider]  calcium carbonate (TUMS) 500 MG chewable tablet Chew 1 tablet by mouth daily.    [provider]  Cholecalciferol 1000 units tablet Take 1,000 Units by mouth at bedtime.     [provider]  Cyanocobalamin (VITAMIN B-12) 1000 MCG SUBL Place 1 tablet under the tongue at bedtime.    [provider]  donepezil (ARICEPT) 10 MG tablet Take 1 tablet by mouth at bedtime. 01/24/17   [provider]  Nutritional Supplements (ENSURE ENLIVE PO) Take 1.5 kcal by mouth 2 (two) times daily between meals.    [provider]  polyethylene glycol (MIRALAX / GLYCOLAX) packet Take 17 g by mouth daily as needed for mild constipation or moderate constipation.     [provider]     Family History  Problem Relation Age of Onset  . Cancer Father        ? primary  . Diabetes Neg Hx   . Heart disease Neg Hx   . Stroke Neg Hx     Social History   Socioeconomic History  . Marital status: Widowed    Spouse name: Not on file  . Number of children: Not on file  . Years of education: Not on file  . Highest education level:  Not on file  Occupational History  . Not on file  Tobacco Use  . Smoking status: Never Smoker  . Smokeless tobacco: Never Used  Substance and Sexual Activity  . Alcohol use: No  . Drug use: No  . Sexual activity: Not on file  Other Topics Concern  . Not on file  Social History Narrative  . Not on file   Social Determinants of Health   Financial Resource Strain:   . Difficulty of Paying Living Expenses: Not on file  Food Insecurity:   . Worried About Charity fundraiser in the Last Year: Not on file  . Ran Out of Food in the Last Year: Not on file  Transportation Needs:   . Lack of Transportation (Medical): Not on file  . Lack of Transportation (Non-Medical): Not on file  Physical Activity:   . Days of Exercise per Week: Not on file  . Minutes of Exercise per Session: Not on file  Stress:   . Feeling of Stress : Not on file  Social Connections:   . Frequency of Communication with Friends and Family: Not on file  . Frequency of Social Gatherings with Friends and Family: Not on file  . Attends Religious Services: Not on file  . Active Member of Clubs or Organizations: Not on file  . Attends Archivist Meetings: Not on file  . Marital Status: Not on file     Review of Systems: A 12 point ROS discussed and pertinent positives are indicated in the HPI above.  All other systems are negative.  Review of Systems  Unable to perform ROS: Dementia    Vital Signs: There were no vitals taken for this visit.  Physical Exam Vitals and nursing note reviewed.  Constitutional:      General: She is not in acute distress.    Appearance: She is not ill-appearing.  HENT:     Mouth/Throat:     Mouth: Mucous membranes are moist.     Pharynx: Oropharynx is clear.  Cardiovascular:     Rate and Rhythm: Normal rate and regular rhythm.  Pulmonary:     Effort: Pulmonary effort is normal. No respiratory distress.     Breath sounds: Normal breath sounds.  Abdominal:      General: Abdomen is flat. There is no distension.     Tenderness: There is abdominal tenderness (redness at prior drain site. exquistely tender to palpation in RUQ).  Skin:    General: Skin is warm and dry.  Neurological:     General: No focal deficit present.     Mental Status: She is alert and oriented to person, place, and time. Mental status is at baseline.  Psychiatric:        Mood and Affect: Mood normal.        Behavior: Behavior normal.        Thought Content: Thought content normal.        Judgment: Judgment normal.      MD Evaluation Airway: WNL Heart: WNL Abdomen: WNL Chest/ Lungs: WNL ASA  Classification: 3 Mallampati/Airway Score: One   Imaging: CT ABDOMEN PELVIS WO CONTRAST  Result Date: 11/13/2019 CLINICAL DATA:  Right-sided abdominal pain EXAM: CT ABDOMEN AND PELVIS WITHOUT CONTRAST TECHNIQUE: Multidetector CT imaging of the abdomen and pelvis was performed following the standard protocol without IV contrast. COMPARISON:  08/26/2019 FINDINGS: Lower chest: Unchanged 4 mm nodule within the medial aspect of the left lower lobe (series 4, image 18). 4 mm right lower lobe nodule (series 4, image 10) is also unchanged. No new or acute findings within the visualized lung bases. Hepatobiliary: Gallbladder is mildly dilated with circumferential gallbladder wall thickening and multiple hyperdense gallstones both in the dependent portions of the gallbladder as well as within the gallbladder neck (series 2, image 24). There is surrounding pericholecystic fat stranding and trace fluid. No well-defined fluid collection or abscess. No pneumoperitoneum. Liver is unremarkable. No intrahepatic biliary dilatation. Pancreas: Unremarkable. No pancreatic ductal dilatation or surrounding inflammatory changes. Spleen: Normal in size without focal abnormality. Adrenals/Urinary Tract: Unremarkable adrenal glands. Right renal atrophy and scarring with nonobstructing 1-2 mm calculus. Left kidney  within normal limits. Ureters are nondilated. Urinary bladder is incompletely distended but appears otherwise unremarkable. Stomach/Bowel: Postsurgical changes to the bowel with anastomotic suture in the right lower quadrant. Extensive colonic diverticulosis. No pericolonic inflammatory changes. No evidence of bowel obstruction. Small hiatal hernia. Redemonstrated duodenal diverticulum. Vascular/Lymphatic: Aortic atherosclerosis. No enlarged abdominal or pelvic lymph nodes. Reproductive: Uterus and bilateral adnexa are unremarkable. Other: Pessary in place. Rectus diastasis. Numerous surgical clips in the right upper abdomen. Musculoskeletal: No acute or significant osseous findings. Previously seen tract from prior cholecystostomy tube appears well healed. IMPRESSION: 1. Persistently thickened gallbladder wall with numerous gallstones, including one at the level of the gallbladder neck. Surrounding pericholecystic fat stranding without a well-defined fluid collection or evidence of abscess. Findings are concerning for acute on chronic cholecystitis. Consider nuclear medicine hepatobiliary scan to evaluate patency of the cystic and common bile ducts. 2. Extensive colonic diverticulosis without evidence of diverticulitis. Aortic Atherosclerosis (ICD10-I70.0). These results will be called to the ordering clinician or representative by the Radiologist Assistant, and communication documented in the PACS or zVision Dashboard. Electronically Signed   By: Davina Poke D.O.   On: 11/13/2019 15:58   US Abdomen Limited  Result Date: 11/16/2019 CLINICAL DATA:  History of cholelithiasis with right upper quadrant pain. EXAM: ULTRASOUND ABDOMEN LIMITED RIGHT UPPER QUADRANT COMPARISON:  CT abdomen pelvis 11/13/2019. FINDINGS: Gallbladder: There are shadowing echogenic stones and sludge in the gallbladder. Gallbladder wall is thickened, 8 mm. Pericholecystic fluid is present. Positive sonographic Murphy sign. Common bile  duct: Diameter: Poorly visualized due to patient condition and inability to breath hold. Liver: No focal lesion identified. Within normal limits in parenchymal echogenicity. Portal vein is patent on color Doppler imaging with normal direction of blood flow towards the liver. Other: None. IMPRESSION: Acute cholecystitis. Electronically Signed   By: Lorin Picket M.D.   On: 11/16/2019 16:04   DG Chest Port 1 View  Result Date: 11/16/2019 CLINICAL DATA:  Pre-admission chest x-ray 4 cholecystitis. EXAM: PORTABLE CHEST 1 VIEW COMPARISON:  October 09, 2017 FINDINGS: Very  mild linear atelectasis is seen within the right lung base. There is no evidence of a pleural effusion or pneumothorax. The heart size and mediastinal contours are within normal limits. There is moderate severity calcification of the aortic arch. Multilevel degenerative changes are seen throughout the thoracic spine. IMPRESSION: 1. Very mild right basilar linear atelectasis. Electronically Signed   By: Virgina Norfolk M.D.   On: 11/16/2019 17:17    Labs:  CBC: Recent Labs    08/26/19 1215 10/29/19 1430 11/16/19 1620 11/17/19 0634  WBC 11.8* 5.6 10.3 9.6  HGB 11.3* 12.1 12.1 11.2*  HCT 35.7* 38.4 39.3 34.8*  PLT 227 213 233 243    COAGS: Recent Labs    11/18/19 0536  INR 1.4*    BMP: Recent Labs    08/26/19 1215 10/29/19 1430 11/16/19 1620 11/17/19 0634  NA 139 138 138 145  K 3.8 4.1 3.7 3.6  CL 103 106 105 112*  CO2 26 25 21* 24  GLUCOSE 97 146* 122* 116*  BUN 31* 20 31* 26*  CALCIUM 8.6* 8.7* 8.5* 8.4*  CREATININE 1.02* 0.90 1.05* 0.95  GFRNONAA 48* 56* 46* 52*  GFRAA 55* >60 53* >60    LIVER FUNCTION TESTS: Recent Labs    07/09/19 1115 08/26/19 1215 11/16/19 1620 11/17/19 0634  BILITOT 0.3 0.4 0.8 0.7  AST 16 22 103* 66*  ALT 12 19 81* 75*  ALKPHOS 69 67 84 76  PROT 7.2 6.4* 7.0 6.4*  ALBUMIN 3.6 2.8* 3.0* 2.5*    TUMOR MARKERS: No results for input(s): AFPTM, CEA, CA199, CHROMGRNA in  the last 8760 hours.  Assessment and Plan: Cholecystitis Patient known to IR from previous cholecystostomy tube placement and regular exchanges.  Per her daughter, her tube was dislodged at least 1 month ago and decision made at that time to trial leaving the tube out.  She now returns with RUQ pain and evidence of cholecystitis.  IR consulted for replacement.  She is exquisitely tender in the RUQ.  Afebrile. WBC WNL.  She is on Unasyn.  NPO.  Blood thinners held.  INR 1.4  Risks and benefits discussed with the patient's daughter including, but not limited to bleeding, infection, gallbladder perforation, bile leak, sepsis or even death.  All questions were answered, agreeable to proceed. Consent signed and in chart.   Thank you for this interesting consult.  I greatly enjoyed meeting Cynthia [name] and look forward to participating in their care.  A copy of this report was sent to the requesting provider on this date.  Electronically Signed: Docia Barrier, PA 11/18/2019, 10:23 AM   I spent a total of 40 Minutes    in face to face in clinical consultation, greater than 50% of which was counseling/coordinating care for acute cholecystitis.

## 2019-11-18 NOTE — Procedures (Signed)
Cholecystitis  S/p perc cholecystostomy No comp Stable ebl min 70 cc pus aspirated Full report in pacs

## 2019-11-18 NOTE — Progress Notes (Addendum)
Report given to [facility] with [facility]. Additionally, pt has missed 2 doses of Unasyn 3gm due to loss of IV site and pt's refusal of restart until early this am, and then she refused the IV antibiotics. Dr. Carles Collet and [facility] both notified of same.

## 2019-11-18 NOTE — Progress Notes (Signed)
Subjective: Patient resting comfortably in bed. When examined, she becomes combative. She does have mitts on. Apparently refused IV antibiotics and IV Hep-Lock overnight, but it has been restarted.  Objective: Vital signs in last 24 hours: Temp:  [98.1 F (36.7 C)-98.9 F (37.2 C)] 98.9 F (37.2 C) (01/27 0300) Pulse Rate:  [89-90] 89 (01/27 0300) Resp:  [16-20] 16 (01/27 0300) BP: (131-133)/(51-56) 131/56 (01/27 0300) SpO2:  [94 %-95 %] 94 % (01/27 0300) Last BM Date: (Pt doesn't recall)  Intake/Output from previous day: 01/26 0701 - 01/27 0700 In: 120 [P.O.:120] Out: -  Intake/Output this shift: No intake/output data recorded.  General appearance: appears stated age and combative GI: Soft, though appears irritated when being examined. No rigidity is noted. Examination limited secondary to mental status.  Lab Results:  Recent Labs    11/16/19 1620 11/17/19 0634  WBC 10.3 9.6  HGB 12.1 11.2*  HCT 39.3 34.8*  PLT 233 243   BMET Recent Labs    11/16/19 1620 11/17/19 0634  NA 138 145  K 3.7 3.6  CL 105 112*  CO2 21* 24  GLUCOSE 122* 116*  BUN 31* 26*  CREATININE 1.05* 0.95  CALCIUM 8.5* 8.4*   PT/INR Recent Labs    11/18/19 0536  LABPROT 16.7*  INR 1.4*    Studies/Results: US Abdomen Limited  Result Date: 11/16/2019 CLINICAL DATA:  History of cholelithiasis with right upper quadrant pain. EXAM: ULTRASOUND ABDOMEN LIMITED RIGHT UPPER QUADRANT COMPARISON:  CT abdomen pelvis 11/13/2019. FINDINGS: Gallbladder: There are shadowing echogenic stones and sludge in the gallbladder. Gallbladder wall is thickened, 8 mm. Pericholecystic fluid is present. Positive sonographic Murphy sign. Common bile duct: Diameter: Poorly visualized due to patient condition and inability to breath hold. Liver: No focal lesion identified. Within normal limits in parenchymal echogenicity. Portal vein is patent on color Doppler imaging with normal direction of blood flow towards the  liver. Other: None. IMPRESSION: Acute cholecystitis. Electronically Signed   By: Lorin Picket M.D.   On: 11/16/2019 16:04   DG Chest Port 1 View  Result Date: 11/16/2019 CLINICAL DATA:  Pre-admission chest x-ray 4 cholecystitis. EXAM: PORTABLE CHEST 1 VIEW COMPARISON:  October 09, 2017 FINDINGS: Very mild linear atelectasis is seen within the right lung base. There is no evidence of a pleural effusion or pneumothorax. The heart size and mediastinal contours are within normal limits. There is moderate severity calcification of the aortic arch. Multilevel degenerative changes are seen throughout the thoracic spine. IMPRESSION: 1. Very mild right basilar linear atelectasis. Electronically Signed   By: Virgina Norfolk M.D.   On: 11/16/2019 17:17    Anti-infectives: Anti-infectives (From admission, onward)   Start     Dose/Rate Route Frequency Ordered Stop   11/17/19 0900  Ampicillin-Sulbactam (UNASYN) 3 g in sodium chloride 0.9 % 100 mL IVPB     3 g 200 mL/hr over 30 Minutes Intravenous Every 8 hours 11/17/19 0802     11/16/19 1730  cefTRIAXone (ROCEPHIN) 2 g in sodium chloride 0.9 % 100 mL IVPB  Status:  Discontinued     2 g 200 mL/hr over 30 Minutes Intravenous Every 24 hours 11/16/19 1706 11/17/19 0802   11/16/19 1715  metroNIDAZOLE (FLAGYL) IVPB 500 mg  Status:  Discontinued     500 mg 100 mL/hr over 60 Minutes Intravenous Every 8 hours 11/16/19 1706 11/17/19 0802      Assessment/Plan: Impression: Cholecystitis with cholelithiasis Plan: [facility] is here to take patient for IR cholecystostomy tube. Discussed management  with Dr. Carles Collet.  LOS: 2 days    [facility] Signs 11/18/2019

## 2019-11-19 ENCOUNTER — Inpatient Hospital Stay (HOSPITAL_COMMUNITY): Payer: Medicare Other

## 2019-11-19 DIAGNOSIS — K8066 Calculus of gallbladder and bile duct with acute and chronic cholecystitis without obstruction: Secondary | ICD-10-CM

## 2019-11-19 LAB — COMPREHENSIVE METABOLIC PANEL
ALT: 38 U/L (ref 0–44)
AST: 17 U/L (ref 15–41)
Albumin: 2.5 g/dL — ABNORMAL LOW (ref 3.5–5.0)
Alkaline Phosphatase: 72 U/L (ref 38–126)
Anion gap: 8 (ref 5–15)
BUN: 20 mg/dL (ref 8–23)
CO2: 26 mmol/L (ref 22–32)
Calcium: 8.5 mg/dL — ABNORMAL LOW (ref 8.9–10.3)
Chloride: 114 mmol/L — ABNORMAL HIGH (ref 98–111)
Creatinine, Ser: 0.95 mg/dL (ref 0.44–1.00)
GFR calc Af Amer: >60 mL/min (ref >60)
GFR calc non Af Amer: 52 mL/min — ABNORMAL LOW (ref >60)
Glucose, Bld: 88 mg/dL (ref 70–99)
Potassium: 3.7 mmol/L (ref 3.5–5.1)
Sodium: 148 mmol/L — ABNORMAL HIGH (ref 135–145)
Total Bilirubin: 1 mg/dL (ref 0.3–1.2)
Total Protein: 6.4 g/dL — ABNORMAL LOW (ref 6.5–8.1)

## 2019-11-19 LAB — CBC
HCT: 35.2 % — ABNORMAL LOW (ref 36.0–46.0)
Hemoglobin: 11.2 g/dL — ABNORMAL LOW (ref 12.0–15.0)
MCH: 30.4 pg (ref 26.0–34.0)
MCHC: 31.8 g/dL (ref 30.0–36.0)
MCV: 95.4 fL (ref 80.0–100.0)
Platelets: 265 10*3/uL (ref 150–400)
RBC: 3.69 MIL/uL — ABNORMAL LOW (ref 3.87–5.11)
RDW: 16.8 % — ABNORMAL HIGH (ref 11.5–15.5)
WBC: 12.3 10*3/uL — ABNORMAL HIGH (ref 4.0–10.5)
nRBC: 0 % (ref 0.0–0.2)

## 2019-11-19 LAB — MAGNESIUM: Magnesium: 2.3 mg/dL (ref 1.7–2.4)

## 2019-11-19 MED ORDER — PIPERACILLIN-TAZOBACTAM 3.375 G IVPB
3.3750 g | Freq: Three times a day (TID) | INTRAVENOUS | Status: DC
  Administered 2019-11-19 – 2019-11-21 (×7): 3.375 g via INTRAVENOUS
  Filled 2019-11-19 (×7): qty 50

## 2019-11-19 MED ORDER — POTASSIUM CHLORIDE IN NACL 20-0.45 MEQ/L-% IV SOLN: INTRAVENOUS | Status: AC

## 2019-11-19 NOTE — Progress Notes (Signed)
Cholecystotomy dressing clean, dry and intact. No drainage noted to the outside of dressing. 50cc of green drainage emptied from drainage bag. Tube flushed with 10cc of sterile saline, per order. Patient tolerated well.

## 2019-11-19 NOTE — Progress Notes (Signed)
When pt sat up in bed for feeding, noted large amount thick yellow drainage on bed linens and gown. Drainage coming from under dressing at tube insertion site. Dressing removed and noted continued drainage of pus-like liquid from around tube at insertion site. Pt has red, indurated area just above insertion site and previous insertion site, approx 1cm x 4xm. When touch, more pus-like drainage noted to come from around tube insertion site. Skin cleaned, insertion site cleaned, new dry guaze dressing with abd overlay applied and secured with tape. Pt tolerate well. 85 ml of tannish-green drainage emptied from drainage bag.  Jannifer Franklin, PA from [facility] IR called for phone rounds during dressing change, advised her of findings of thick, pus-like drainage from insertion site. Only advised to continue to change dressing as needed. Also received order to flush drainage tube each shift with 10 ml of sterile saline.  Dr. Arnoldo Morale and Tat notified of drainage and of conversation with IR PA Nonie Hoyer. No additional orders noted.

## 2019-11-19 NOTE — Progress Notes (Signed)
At 1725 heard chair alarm ringing, entered room to find pt lying pronein the floor in front of chair on herwith left hand under head, yelling, "Help!". Pt's legs and floor covered in loose stool. Pt alert, oriented per her baseline (person only). Only complaint of pain is in her abd (right side) at site of cholecystotomy drain. Drain intact, dressing intact.  VSS stable, assessed for any obvious bony injury. Pt able to move all extremities without diff or c/o pain. No pain with palpation of hips, knees, shoulder or elbows. Denies hitting head, no bruising noted to face. Denies any neck pain, able to lift and turn head on her own. Pt refused to allow staff to help her up. Pt's legs and buttocks cleaned of stool, then pt turned self over onto back. Assessment of anterior body completed for any injuries to abd, chin, knees, etc. None noted. Pt assisted to sit up onto bottom, then assisted to stand x2. Pt tolerated well. Pt ambulated with standby assist x2 into bed. Pt stated, "I'm hungry!" Pt's dinner tray set up for her.  MD Tat notified of fall and into room to evaluate pt. Bruising noted to right knee (2cm x 2cm), left hand, right forearm near elbow. Pt told MD her right hand hurt, but moves without difficulty and no bruising noted.  Recheck of B/P 133/59, pulse 85 and regular, SaO2 on room air 98%, resp 18/min and non-labored.

## 2019-11-19 NOTE — Progress Notes (Signed)
Subjective: Patient alert this morning.  Denies any abdominal pain until she is examined.  Still appears confused as to her cholecystostomy tube placement yesterday as this may be an element of her dementia.  Objective: Vital signs in last 24 hours: Temp:  [97.9 F (36.6 C)-98.8 F (37.1 C)] 98.4 F (36.9 C) (01/28 0615) Pulse Rate:  [74-87] 86 (01/28 0615) Resp:  [12-20] 16 (01/28 0615) BP: (92-142)/(48-91) 142/59 (01/28 0615) SpO2:  [94 %-98 %] 95 % (01/28 0615) Last BM Date: 11/17/19  Intake/Output from previous day: 01/27 0701 - 01/28 0700 In: 431.3 [P.O.:60; IV Piggyback:301.3] Out: 140 [Urine:50; Drains:90] Intake/Output this shift: No intake/output data recorded.  General appearance: alert, appears stated age and no distress GI: Soft with nonspecific migratory tenderness to palpation.  No rigidity is noted.  Cholecystostomy tube in place in the right upper quadrant.  Bile was noted to be in the biliary bag.  Lab Results:  Recent Labs    11/17/19 0634 11/19/19 0606  WBC 9.6 12.3*  HGB 11.2* 11.2*  HCT 34.8* 35.2*  PLT 243 265   BMET Recent Labs    11/17/19 0634 11/19/19 0606  NA 145 148*  K 3.6 3.7  CL 112* 114*  CO2 24 26  GLUCOSE 116* 88  BUN 26* 20  CREATININE 0.95 0.95  CALCIUM 8.4* 8.5*   PT/INR Recent Labs    11/18/19 0536  LABPROT 16.7*  INR 1.4*    Studies/Results: IR Perc Cholecystostomy  Result Date: 11/18/2019 INDICATION: Recurrent cholecystitis EXAM: Ultrasound fluoroscopic percutaneous cholecystostomy MEDICATIONS: Patient is already receiving IV antibiotics as an inpatient ANESTHESIA/SEDATION: Moderate (conscious) sedation was employed during this procedure. A total of Versed 1.5 mg and Fentanyl 75 mcg was administered intravenously. Moderate Sedation Time: 17 minutes. The patient's level of consciousness and vital signs were monitored continuously by radiology nursing throughout the procedure under my direct supervision. FLUOROSCOPY  TIME:  Fluoroscopy Time: 0 minutes 6 seconds (1 mGy). COMPLICATIONS: None immediate. PROCEDURE: Informed written consent was obtained from the patient's family after a thorough discussion of the procedural risks, benefits and alternatives. All questions were addressed. Maximal Sterile Barrier Technique was utilized including caps, mask, sterile gowns, sterile gloves, sterile drape, hand hygiene and skin antiseptic. A timeout was performed prior to the initiation of the procedure. Under sterile conditions and local anesthesia, ultrasound percutaneous needle access performed of the gallbladder. Needle position confirmed with ultrasound. Images obtained for documentation. There was return of purulent bile. Guidewire inserted followed by tract dilatation to insert a 10 [place] drain. Drain catheter position confirmed with ultrasound and fluoroscopy. Syringe aspiration yielded 70 cc purulent bile. Sample sent for culture. Catheter secured with a Prolene suture and connected to external gravity drainage bag. Sterile dressing applied. No immediate complication. Patient tolerated the procedure well. IMPRESSION: Successful ultrasound and fluoroscopic 10 French cholecystostomy Electronically Signed   By: Jerilynn Mages.  Shick M.D.   On: 11/18/2019 14:35    Anti-infectives: Anti-infectives (From admission, onward)   Start     Dose/Rate Route Frequency Ordered Stop   11/19/19 0700  piperacillin-tazobactam (ZOSYN) IVPB 3.375 g     3.375 g 12.5 mL/hr over 240 Minutes Intravenous Every 8 hours 11/19/19 0638     11/17/19 0900  Ampicillin-Sulbactam (UNASYN) 3 g in sodium chloride 0.9 % 100 mL IVPB  Status:  Discontinued     3 g 200 mL/hr over 30 Minutes Intravenous Every 8 hours 11/17/19 0802 11/19/19 0638   11/16/19 1730  cefTRIAXone (ROCEPHIN) 2 g in sodium  chloride 0.9 % 100 mL IVPB  Status:  Discontinued     2 g 200 mL/hr over 30 Minutes Intravenous Every 24 hours 11/16/19 1706 11/17/19 0802   11/16/19 1715  metroNIDAZOLE  (FLAGYL) IVPB 500 mg  Status:  Discontinued     500 mg 100 mL/hr over 60 Minutes Intravenous Every 8 hours 11/16/19 1706 11/17/19 0802      Assessment/Plan: Impression: Successful placement of cholecystostomy tube.  LFTs have normalized.  White blood cell count only mildly elevated at 12.3.  She has been placed back on a heart healthy diet.  Okay for discharge back to nursing home with cholecystostomy tube in place.  Discussed with Dr. Carles Collet.  LOS: 3 days    [facility] Signs 11/19/2019

## 2019-11-19 NOTE — Progress Notes (Signed)
Dressing changed at cholecystotomy drain site. Current dressing partially saturated with pus-like drainage, but no new drainage noted with palpation of reddened area above tube insertion. Pt tolerated dressing change well. Drainage bag with greenish-tan liquid noted.

## 2019-11-19 NOTE — Progress Notes (Signed)
   IR Round note via phone  Percutaneous cholecystomy drain placed in IR 1/27 No comp Stable ebl min 70 cc pus aspirated  RN reports pt is resting Afeb Site is clean Has had to change bandage secondary pus like OP around site  OP otherwise is bile 90 cc yesterday 85 cc today so far  Flushing easily She will flush daily with 10 cc sterile saline and record OP  We will follow

## 2019-11-19 NOTE — Progress Notes (Signed)
PROGRESS NOTE  MI CHAP [id number] DOB: 06/22/1925 DOA: 11/16/2019 PCP: Hennie Duos, MD  Brief History:  86 year old female with a history of dementia, hypertension, breast cancer, cholecystitis presenting from [facility] secondary to abdominal pain for approximately 3 to 4 days.  The patient had a CT of the abdomen and pelvis on 11/13/2019 which showed persistently thickened gallbladder with numerous gallstones including one at the level of the gallbladder neck.  There was surrounding pericholecystic fluid and fat stranding without well-defined abscess.  As result, the patient was sent to the emergency department for further evaluation.  Initial WBC was 7.3 with AST 103, ALT 81, alkaline phosphatase 84, total bilirubin 0.8, lipase 28.  Right upper quadrant ultrasound showed echogenic stones, sludge in the gallbladder, thickened gallbladder wall, pericholecystic fluid.  General surgery was consulted.  She was not felt to be a good surgical candidate.  IR was consulted for cholecystotomy tube placement.  Assessment/Plan: Acute on chronic cholecystitis/cholelithiasis -Case discussed with general surgery, Dr. Arnoldo Morale -11/18/2019--percholecystotomy tube -Patient had previous cholecystotomy tube which was exchanged on 06/09/2019 -Continue antibiotics IV--change to zosyn pending culture  Dementia with behavioral disturbance -The patient has had episodes of agitation since hospitalization consistent with hospital delirium -Haldol as needed agitation -Continue Aricept  Essential hypertension -Not on any medications presently -BP is acceptable  Chronic constipation -miralax prn  Fall -pt had unwitnessed fall 1/28 pm -pt got up with minimal assist -denies hitting head -mentation at baseline -xray right wrist    Disposition Plan:   SNF 1-2 days  Family Communication:  No Family at bedside  Consultants:  General surgery Code Status:  FULL  DVT  Prophylaxis:  SCDs   Procedures: As Listed in Progress Note Above  Antibiotics: Unasyn 1/26>>>1/28 Ceftriaxone/flagyl 1/25 Zosyn 1/28>>>       Subjective: Complains right wrist and hand pain after fall this afternoon.  Denies abd pain if not manipulated.  Denies cp, sob, headache, leg pain.  Objective: Vitals:   11/18/19 2113 11/19/19 0615 11/19/19 1301 11/19/19 1725  BP:  (!) 142/59 (!) 142/59 (!) 174/86  Pulse: 87 86 84 82  Resp: 18 16 (!) 21   Temp: 98.8 F (37.1 C) 98.4 F (36.9 C)    TempSrc: Oral Oral    SpO2: 94% 95% 97%   Weight:      Height:        Intake/Output Summary (Last 24 hours) at 11/19/2019 1807 Last data filed at 11/19/2019 0759 Gross per 24 hour  Intake 361.33 ml  Output 225 ml  Net 136.33 ml   Weight change:  Exam:   General:  Pt is alert, follows commands appropriately, not in acute distress  HEENT: No icterus, No thrush, No neck mass, [place]/AT  Cardiovascular: RRR, S1/S2, no rubs, no gallops  Respiratory: bibasilar rales. No wheeze  Abdomen: Soft/+BS, RUQ tender, non distended, no guarding  Extremities: No edema, No lymphangitis, No petechiae, No rashes, no synovitis   Data Reviewed: I have personally reviewed following labs and imaging studies Basic Metabolic Panel: Recent Labs  Lab 11/16/19 1620 11/17/19 0634 11/19/19 0606  NA 138 145 148*  K 3.7 3.6 3.7  CL 105 112* 114*  CO2 21* 24 26  GLUCOSE 122* 116* 88  BUN 31* 26* 20  CREATININE 1.05* 0.95 0.95  CALCIUM 8.5* 8.4* 8.5*  MG  --   --  2.3   Liver Function Tests: Recent Labs  Lab 11/16/19  1620 11/17/19 0634 11/19/19 0606  AST 103* 66* 17  ALT 81* 75* 38  ALKPHOS 84 76 72  BILITOT 0.8 0.7 1.0  PROT 7.0 6.4* 6.4*  ALBUMIN 3.0* 2.5* 2.5*   Recent Labs  Lab 11/16/19 1620  LIPASE 28   No results for input(s): AMMONIA in the last 168 hours. Coagulation Profile: Recent Labs  Lab 11/18/19 0536  INR 1.4*   CBC: Recent Labs  Lab 11/16/19 1620  11/17/19 0634 11/19/19 0606  WBC 10.3 9.6 12.3*  NEUTROABS 8.5*  --   --   HGB 12.1 11.2* 11.2*  HCT 39.3 34.8* 35.2*  MCV 98.5 94.6 95.4  PLT 233 243 265   Cardiac Enzymes: No results for input(s): CKTOTAL, CKMB, CKMBINDEX, TROPONINI in the last 168 hours. BNP: Invalid input(s): POCBNP CBG: No results for input(s): GLUCAP in the last 168 hours. HbA1C: No results for input(s): HGBA1C in the last 72 hours. Urine analysis:    Component Value Date/Time   COLORURINE YELLOW 10/09/2017 0926   APPEARANCEUR CLEAR 10/09/2017 0926   LABSPEC 1.016 10/09/2017 0926   PHURINE 5.0 10/09/2017 0926   GLUCOSEU NEGATIVE 10/09/2017 0926   HGBUR NEGATIVE 10/09/2017 0926   BILIRUBINUR NEGATIVE 10/09/2017 0926   KETONESUR NEGATIVE 10/09/2017 0926   PROTEINUR NEGATIVE 10/09/2017 0926   UROBILINOGEN 0.2 12/08/2013 1848   NITRITE NEGATIVE 10/09/2017 0926   LEUKOCYTESUR NEGATIVE 10/09/2017 0926   Sepsis Labs: @LABRCNTIP (procalcitonin:4,lacticidven:4) ) Recent Results (from the past 240 hour(s))  SARS CORONAVIRUS 2 (Gayl Ivanoff 6-24 HRS) Nasopharyngeal Nasopharyngeal Swab     Status: None   Collection Time: 11/16/19  4:49 PM   Specimen: Nasopharyngeal Swab  Result Value Ref Range Status   SARS Coronavirus 2 NEGATIVE NEGATIVE Final    Comment: (NOTE) SARS-CoV-2 target nucleic acids are NOT DETECTED. The SARS-CoV-2 RNA is generally detectable in upper and lower respiratory specimens during the acute phase of infection. Negative results do not preclude SARS-CoV-2 infection, do not rule out co-infections with other pathogens, and should not be used as the sole basis for treatment or other patient management decisions. Negative results must be combined with clinical observations, patient history, and epidemiological information. The expected result is Negative. Fact Sheet for Patients: SugarRoll.be Fact Sheet for Healthcare  Providers: https://www.woods-mathews.com/ This test is not yet approved or cleared by the [place] FDA and  has been authorized for detection and/or diagnosis of SARS-CoV-2 by FDA under an Emergency Use Authorization (EUA). This EUA will remain  in effect (meaning this test can be used) for the duration of the COVID-19 declaration under Section 56 4(b)(1) of the Act, 21 U.S.C. section 360bbb-3(b)(1), unless the authorization is terminated or revoked sooner. Performed at [facility], [place] [address] [place], [place] [address]   Aerobic/Anaerobic Culture (surgical/deep wound)     Status: None (Preliminary result)   Collection Time: 11/18/19  2:37 PM   Specimen: Gallbladder; Abscess  Result Value Ref Range Status   Specimen Description ABSCESS GALL BLADDER  Final   Special Requests NONE  Final   Gram Stain   Final    ABUNDANT WBC PRESENT,BOTH PMN AND MONONUCLEAR RARE GRAM POSITIVE COCCI FEW GRAM NEGATIVE RODS MODERATE GRAM VARIABLE ROD    Culture   Final    CULTURE REINCUBATED FOR BETTER GROWTH Performed at [facility], [place] [address] [place], [place] [address]    Report Status PENDING  Incomplete     Scheduled Meds: . donepezil  10 mg Oral QHS   Continuous  Infusions: . piperacillin-tazobactam (ZOSYN)  IV 3.375 g (11/19/19 1520)    Procedures/Studies: CT ABDOMEN PELVIS WO CONTRAST  Result Date: 11/13/2019 CLINICAL DATA:  Right-sided abdominal pain EXAM: CT ABDOMEN AND PELVIS WITHOUT CONTRAST TECHNIQUE: Multidetector CT imaging of the abdomen and pelvis was performed following the standard protocol without IV contrast. COMPARISON:  08/26/2019 FINDINGS: Lower chest: Unchanged 4 mm nodule within the medial aspect of the left lower lobe (series 4, image 18). 4 mm right lower lobe nodule (series 4, image 10) is also unchanged. No new or acute findings within the visualized lung bases. Hepatobiliary: Gallbladder is mildly dilated with circumferential  gallbladder wall thickening and multiple hyperdense gallstones both in the dependent portions of the gallbladder as well as within the gallbladder neck (series 2, image 24). There is surrounding pericholecystic fat stranding and trace fluid. No well-defined fluid collection or abscess. No pneumoperitoneum. Liver is unremarkable. No intrahepatic biliary dilatation. Pancreas: Unremarkable. No pancreatic ductal dilatation or surrounding inflammatory changes. Spleen: Normal in size without focal abnormality. Adrenals/Urinary Tract: Unremarkable adrenal glands. Right renal atrophy and scarring with nonobstructing 1-2 mm calculus. Left kidney within normal limits. Ureters are nondilated. Urinary bladder is incompletely distended but appears otherwise unremarkable. Stomach/Bowel: Postsurgical changes to the bowel with anastomotic suture in the right lower quadrant. Extensive colonic diverticulosis. No pericolonic inflammatory changes. No evidence of bowel obstruction. Small hiatal hernia. Redemonstrated duodenal diverticulum. Vascular/Lymphatic: Aortic atherosclerosis. No enlarged abdominal or pelvic lymph nodes. Reproductive: Uterus and bilateral adnexa are unremarkable. Other: Pessary in place. Rectus diastasis. Numerous surgical clips in the right upper abdomen. Musculoskeletal: No acute or significant osseous findings. Previously seen tract from prior cholecystostomy tube appears well healed. IMPRESSION: 1. Persistently thickened gallbladder wall with numerous gallstones, including one at the level of the gallbladder neck. Surrounding pericholecystic fat stranding without a well-defined fluid collection or evidence of abscess. Findings are concerning for acute on chronic cholecystitis. Consider nuclear medicine hepatobiliary scan to evaluate patency of the cystic and common bile ducts. 2. Extensive colonic diverticulosis without evidence of diverticulitis. Aortic Atherosclerosis (ICD10-I70.0). These results will be  called to the ordering clinician or representative by the Radiologist Assistant, and communication documented in the PACS or zVision Dashboard. Electronically Signed   By: Davina Poke D.O.   On: 11/13/2019 15:58   US Abdomen Limited  Result Date: 11/16/2019 CLINICAL DATA:  History of cholelithiasis with right upper quadrant pain. EXAM: ULTRASOUND ABDOMEN LIMITED RIGHT UPPER QUADRANT COMPARISON:  CT abdomen pelvis 11/13/2019. FINDINGS: Gallbladder: There are shadowing echogenic stones and sludge in the gallbladder. Gallbladder wall is thickened, 8 mm. Pericholecystic fluid is present. Positive sonographic Murphy sign. Common bile duct: Diameter: Poorly visualized due to patient condition and inability to breath hold. Liver: No focal lesion identified. Within normal limits in parenchymal echogenicity. Portal vein is patent on color Doppler imaging with normal direction of blood flow towards the liver. Other: None. IMPRESSION: Acute cholecystitis. Electronically Signed   By: Lorin Picket M.D.   On: 11/16/2019 16:04   IR Perc Cholecystostomy  Result Date: 11/18/2019 INDICATION: Recurrent cholecystitis EXAM: Ultrasound fluoroscopic percutaneous cholecystostomy MEDICATIONS: Patient is already receiving IV antibiotics as an inpatient ANESTHESIA/SEDATION: Moderate (conscious) sedation was employed during this procedure. A total of Versed 1.5 mg and Fentanyl 75 mcg was administered intravenously. Moderate Sedation Time: 17 minutes. The patient's level of consciousness and vital signs were monitored continuously by radiology nursing throughout the procedure under my direct supervision. FLUOROSCOPY TIME:  Fluoroscopy Time: 0 minutes 6 seconds (1 mGy). COMPLICATIONS: None  immediate. PROCEDURE: Informed written consent was obtained from the patient's family after a thorough discussion of the procedural risks, benefits and alternatives. All questions were addressed. Maximal Sterile Barrier Technique was utilized  including caps, mask, sterile gowns, sterile gloves, sterile drape, hand hygiene and skin antiseptic. A timeout was performed prior to the initiation of the procedure. Under sterile conditions and local anesthesia, ultrasound percutaneous needle access performed of the gallbladder. Needle position confirmed with ultrasound. Images obtained for documentation. There was return of purulent bile. Guidewire inserted followed by tract dilatation to insert a 10 [place] drain. Drain catheter position confirmed with ultrasound and fluoroscopy. Syringe aspiration yielded 70 cc purulent bile. Sample sent for culture. Catheter secured with a Prolene suture and connected to external gravity drainage bag. Sterile dressing applied. No immediate complication. Patient tolerated the procedure well. IMPRESSION: Successful ultrasound and fluoroscopic 10 French cholecystostomy Electronically Signed   By: Jerilynn Mages.  Shick M.D.   On: 11/18/2019 14:35   DG Chest Port 1 View  Result Date: 11/16/2019 CLINICAL DATA:  Pre-admission chest x-ray 4 cholecystitis. EXAM: PORTABLE CHEST 1 VIEW COMPARISON:  October 09, 2017 FINDINGS: Very mild linear atelectasis is seen within the right lung base. There is no evidence of a pleural effusion or pneumothorax. The heart size and mediastinal contours are within normal limits. There is moderate severity calcification of the aortic arch. Multilevel degenerative changes are seen throughout the thoracic spine. IMPRESSION: 1. Very mild right basilar linear atelectasis. Electronically Signed   By: Virgina Norfolk M.D.   On: 11/16/2019 17:17    Orson Eva, DO  [facility] Pager [phone]  If 7PM-7AM, please contact night-coverage [email or web address] Password [email or web address] 11/19/2019, 6:07 PM   LOS: 3 days

## 2019-11-20 LAB — COMPREHENSIVE METABOLIC PANEL
ALT: 25 U/L (ref 0–44)
AST: 14 U/L — ABNORMAL LOW (ref 15–41)
Albumin: 2.3 g/dL — ABNORMAL LOW (ref 3.5–5.0)
Alkaline Phosphatase: 59 U/L (ref 38–126)
Anion gap: 9 (ref 5–15)
BUN: 16 mg/dL (ref 8–23)
CO2: 27 mmol/L (ref 22–32)
Calcium: 8.3 mg/dL — ABNORMAL LOW (ref 8.9–10.3)
Chloride: 109 mmol/L (ref 98–111)
Creatinine, Ser: 0.96 mg/dL (ref 0.44–1.00)
GFR calc Af Amer: 60 mL/min — ABNORMAL LOW (ref >60)
GFR calc non Af Amer: 51 mL/min — ABNORMAL LOW (ref >60)
Glucose, Bld: 99 mg/dL (ref 70–99)
Potassium: 3.3 mmol/L — ABNORMAL LOW (ref 3.5–5.1)
Sodium: 145 mmol/L (ref 135–145)
Total Bilirubin: 0.7 mg/dL (ref 0.3–1.2)
Total Protein: 5.8 g/dL — ABNORMAL LOW (ref 6.5–8.1)

## 2019-11-20 LAB — CBC
HCT: 31.4 % — ABNORMAL LOW (ref 36.0–46.0)
Hemoglobin: 10 g/dL — ABNORMAL LOW (ref 12.0–15.0)
MCH: 30.2 pg (ref 26.0–34.0)
MCHC: 31.8 g/dL (ref 30.0–36.0)
MCV: 94.9 fL (ref 80.0–100.0)
Platelets: 262 10*3/uL (ref 150–400)
RBC: 3.31 MIL/uL — ABNORMAL LOW (ref 3.87–5.11)
RDW: 16.8 % — ABNORMAL HIGH (ref 11.5–15.5)
WBC: 11.4 10*3/uL — ABNORMAL HIGH (ref 4.0–10.5)
nRBC: 0 % (ref 0.0–0.2)

## 2019-11-20 MED ORDER — POTASSIUM CHLORIDE IN NACL 20-0.45 MEQ/L-% IV SOLN
INTRAVENOUS | Status: DC
  Filled 2019-11-20: qty 1000

## 2019-11-20 MED ORDER — POTASSIUM CHLORIDE CRYS ER 20 MEQ PO TBCR
20.0000 meq | EXTENDED_RELEASE_TABLET | Freq: Once | ORAL | Status: AC
  Administered 2019-11-20: 20 meq via ORAL

## 2019-11-20 NOTE — Progress Notes (Signed)
Subjective: Patient alert but combative when examined.  Denies any abdominal pain at rest.  Objective: Vital signs in last 24 hours: Temp:  [98 F (36.7 C)] 98 F (36.7 C) (01/29 0419) Pulse Rate:  [81-85] 81 (01/29 0419) Resp:  [16-21] 16 (01/29 0419) BP: (133-174)/(50-86) 133/50 (01/29 0419) SpO2:  [93 %-98 %] 93 % (01/29 0419) Last BM Date: 11/19/19  Intake/Output from previous day: 01/28 0701 - 01/29 0700 In: 520.3 [P.O.:100; I.V.:284.5; IV Piggyback:125.9] Out: 235 [Drains:235] Intake/Output this shift: Total I/O In: 120 [P.O.:120] Out: -   General appearance: alert and combative GI: Soft with tenderness to palpation throughout her abdomen.  Difficult to a certain whether this is due to her combative nature.  She was wondering why her mitts were on.  Doubt she has rigidity.  Cholecystostomy tube still in place with clear green fluid present.  Lab Results:  Recent Labs    11/19/19 0606 11/20/19 0516  WBC 12.3* 11.4*  HGB 11.2* 10.0*  HCT 35.2* 31.4*  PLT 265 262   BMET Recent Labs    11/19/19 0606 11/20/19 0516  NA 148* 145  K 3.7 3.3*  CL 114* 109  CO2 26 27  GLUCOSE 88 99  BUN 20 16  CREATININE 0.95 0.96  CALCIUM 8.5* 8.3*   PT/INR Recent Labs    11/18/19 0536  LABPROT 16.7*  INR 1.4*    Studies/Results: DG Wrist 2 Views Right  Result Date: 11/19/2019 CLINICAL DATA:  Fall with wrist pain EXAM: RIGHT WRIST - 2 VIEW COMPARISON:  None. FINDINGS: There is no evidence of fracture or dislocation. Mild degenerative changes are seen in involving the carpal bones. Soft tissues are unremarkable. IMPRESSION: No acute abnormality. Electronically Signed   By: Zerita Boers M.D.   On: 11/19/2019 21:35   DG Hand 2 View Right  Result Date: 11/19/2019 CLINICAL DATA:  Fall with hand pain. EXAM: RIGHT HAND - 2 VIEW COMPARISON:  None. FINDINGS: There is no evidence of fracture or dislocation. Mild degenerative changes involving the carpal bones and distal  interphalangeal joints. Soft tissues are unremarkable. IMPRESSION: No acute osseous injury. Electronically Signed   By: Zerita Boers M.D.   On: 11/19/2019 21:36   IR Perc Cholecystostomy  Result Date: 11/18/2019 INDICATION: Recurrent cholecystitis EXAM: Ultrasound fluoroscopic percutaneous cholecystostomy MEDICATIONS: Patient is already receiving IV antibiotics as an inpatient ANESTHESIA/SEDATION: Moderate (conscious) sedation was employed during this procedure. A total of Versed 1.5 mg and Fentanyl 75 mcg was administered intravenously. Moderate Sedation Time: 17 minutes. The patient's level of consciousness and vital signs were monitored continuously by radiology nursing throughout the procedure under my direct supervision. FLUOROSCOPY TIME:  Fluoroscopy Time: 0 minutes 6 seconds (1 mGy). COMPLICATIONS: None immediate. PROCEDURE: Informed written consent was obtained from the patient's family after a thorough discussion of the procedural risks, benefits and alternatives. All questions were addressed. Maximal Sterile Barrier Technique was utilized including caps, mask, sterile gowns, sterile gloves, sterile drape, hand hygiene and skin antiseptic. A timeout was performed prior to the initiation of the procedure. Under sterile conditions and local anesthesia, ultrasound percutaneous needle access performed of the gallbladder. Needle position confirmed with ultrasound. Images obtained for documentation. There was return of purulent bile. Guidewire inserted followed by tract dilatation to insert a 10 [place] drain. Drain catheter position confirmed with ultrasound and fluoroscopy. Syringe aspiration yielded 70 cc purulent bile. Sample sent for culture. Catheter secured with a Prolene suture and connected to external gravity drainage bag. Sterile dressing applied. No  immediate complication. Patient tolerated the procedure well. IMPRESSION: Successful ultrasound and fluoroscopic 10 French cholecystostomy  Electronically Signed   By: Jerilynn Mages.  Shick M.D.   On: 11/18/2019 14:35    Anti-infectives: Anti-infectives (From admission, onward)   Start     Dose/Rate Route Frequency Ordered Stop   11/19/19 0700  piperacillin-tazobactam (ZOSYN) IVPB 3.375 g     3.375 g 12.5 mL/hr over 240 Minutes Intravenous Every 8 hours 11/19/19 0638     11/17/19 0900  Ampicillin-Sulbactam (UNASYN) 3 g in sodium chloride 0.9 % 100 mL IVPB  Status:  Discontinued     3 g 200 mL/hr over 30 Minutes Intravenous Every 8 hours 11/17/19 0802 11/19/19 0638   11/16/19 1730  cefTRIAXone (ROCEPHIN) 2 g in sodium chloride 0.9 % 100 mL IVPB  Status:  Discontinued     2 g 200 mL/hr over 30 Minutes Intravenous Every 24 hours 11/16/19 1706 11/17/19 0802   11/16/19 1715  metroNIDAZOLE (FLAGYL) IVPB 500 mg  Status:  Discontinued     500 mg 100 mL/hr over 60 Minutes Intravenous Every 8 hours 11/16/19 1706 11/17/19 0802      Assessment/Plan: Impression: Successful placement of cholecystostomy tube for acute on chronic cholecystitis, cholelithiasis.  Liver enzyme tests have normalized.  IR to manage cholecystostomy tube as an outpatient.  Okay for transfer back to nursing home when cleared by hospitalist.  LOS: 4 days    Aviva Signs 11/20/2019

## 2019-11-20 NOTE — Progress Notes (Signed)
PROGRESS NOTE  Cynthia [name] [id number] DOB: April 08, 1925 DOA: 11/16/2019 PCP: Hennie Duos, MD    Brief History: 86 year old female with a history of dementia, hypertension, breast cancer, cholecystitis presenting from [facility] secondary to abdominal pain for approximately 3 to 4 days. The patient had a CT of the abdomen and pelvis on 11/13/2019 which showed persistently thickened gallbladder with numerous gallstones including one at the level of the gallbladder neck. There was surrounding pericholecystic fluid and fat stranding without well-defined abscess. As result, the patient was sent to the emergency department for further evaluation. Initial WBC was 7.3 with AST 103, ALT 81, alkaline phosphatase 84, total bilirubin 0.8, lipase 28. Right upper quadrant ultrasound showed echogenic stones, sludge in the gallbladder, thickened gallbladder wall, pericholecystic fluid. General surgery was consulted. She was not felt to be a good surgical candidate. IR was consulted for cholecystotomy tube placement.  Assessment/Plan: Acute on chronic cholecystitis/cholelithiasis -Case discussed with general surgery, Dr. Arnoldo Morale -11/18/2019--percholecystotomy tube -Patient had previous cholecystotomy tube which was exchanged on 06/09/2019 -Continue antibiotics IV--change to zosyn pending culture  Dementia with behavioral disturbance -The patient has had episodes of agitation since hospitalization consistent with hospital delirium -Haldol as needed agitation -Continue Aricept  Essential hypertension -Not on any medications presently -BP is acceptable  Chronic constipation -miralax prn  Fall -pt had unwitnessed fall 1/28 pm -pt got up with minimal assist -denies hitting head -mentation at baseline -xray right wrist--neg  Hypokalemia -replete  Hypernatremia -continue hypotonic IVF another 24 hours    Disposition Plan:SNF 11/21/19 pending final  culture data  Family Communication:NoFamily at bedside  Consultants:General surgery Code Status: FULL  DVT Prophylaxis: SCDs   Procedures: As Listed in Progress Note Above  Antibiotics: Unasyn 1/26>>>1/28 Ceftriaxone/flagyl 1/25 Zosyn 1/28>>>        Subjective: Patient denies fevers, chills, headache, chest pain, dyspnea, nausea, vomiting, diarrhea, abdominal pain, dysuria, hematuria, hematochezia, and melena.   Objective: Vitals:   11/19/19 1800 11/19/19 2135 11/20/19 0419 11/20/19 1257  BP: (!) 133/59 137/60 (!) 133/50 130/63  Pulse: 85 84 81 72  Resp:  17 16 16   Temp:  98 F (36.7 C) 98 F (36.7 C) 98.1 F (36.7 C)  TempSrc:  Oral Oral Oral  SpO2: 98% 93% 93% 95%  Weight:      Height:        Intake/Output Summary (Last 24 hours) at 11/20/2019 1834 Last data filed at 11/20/2019 1300 Gross per 24 hour  Intake 640.3 ml  Output 500 ml  Net 140.3 ml   Weight change:  Exam:   General:  Pt is alert, follows commands appropriately, not in acute distress  HEENT: No icterus, No thrush, No neck mass, [place]/AT  Cardiovascular: RRR, S1/S2, no rubs, no gallops  Respiratory: CTA bilaterally, no wheezing, no crackles, no rhonchi  Abdomen: Soft/+BS, RUQ tender, non distended, no guarding  Extremities: No edema, No lymphangitis, No petechiae, No rashes, no synovitis   Data Reviewed: I have personally reviewed following labs and imaging studies Basic Metabolic Panel: Recent Labs  Lab 11/16/19 1620 11/17/19 0634 11/19/19 0606 11/20/19 0516  NA 138 145 148* 145  K 3.7 3.6 3.7 3.3*  CL 105 112* 114* 109  CO2 21* 24 26 27   GLUCOSE 122* 116* 88 99  BUN 31* 26* 20 16  CREATININE 1.05* 0.95 0.95 0.96  CALCIUM 8.5* 8.4* 8.5* 8.3*  MG  --   --  2.3  --  Liver Function Tests: Recent Labs  Lab 11/16/19 1620 11/17/19 0634 11/19/19 0606 11/20/19 0516  AST 103* 66* 17 14*  ALT 81* 75* 38 25  ALKPHOS 84 76 72 59  BILITOT 0.8 0.7 1.0 0.7   PROT 7.0 6.4* 6.4* 5.8*  ALBUMIN 3.0* 2.5* 2.5* 2.3*   Recent Labs  Lab 11/16/19 1620  LIPASE 28   No results for input(s): AMMONIA in the last 168 hours. Coagulation Profile: Recent Labs  Lab 11/18/19 0536  INR 1.4*   CBC: Recent Labs  Lab 11/16/19 1620 11/17/19 0634 11/19/19 0606 11/20/19 0516  WBC 10.3 9.6 12.3* 11.4*  NEUTROABS 8.5*  --   --   --   HGB 12.1 11.2* 11.2* 10.0*  HCT 39.3 34.8* 35.2* 31.4*  MCV 98.5 94.6 95.4 94.9  PLT 233 243 265 262   Cardiac Enzymes: No results for input(s): CKTOTAL, CKMB, CKMBINDEX, TROPONINI in the last 168 hours. BNP: Invalid input(s): POCBNP CBG: No results for input(s): GLUCAP in the last 168 hours. HbA1C: No results for input(s): HGBA1C in the last 72 hours. Urine analysis:    Component Value Date/Time   COLORURINE YELLOW 10/09/2017 0926   APPEARANCEUR CLEAR 10/09/2017 0926   LABSPEC 1.016 10/09/2017 0926   PHURINE 5.0 10/09/2017 0926   GLUCOSEU NEGATIVE 10/09/2017 0926   HGBUR NEGATIVE 10/09/2017 0926   BILIRUBINUR NEGATIVE 10/09/2017 0926   KETONESUR NEGATIVE 10/09/2017 0926   PROTEINUR NEGATIVE 10/09/2017 0926   UROBILINOGEN 0.2 12/08/2013 1848   NITRITE NEGATIVE 10/09/2017 0926   LEUKOCYTESUR NEGATIVE 10/09/2017 0926   Sepsis Labs: @LABRCNTIP (procalcitonin:4,lacticidven:4) ) Recent Results (from the past 240 hour(s))  SARS CORONAVIRUS 2 (Jesaiah Fabiano 6-24 HRS) Nasopharyngeal Nasopharyngeal Swab     Status: None   Collection Time: 11/16/19  4:49 PM   Specimen: Nasopharyngeal Swab  Result Value Ref Range Status   SARS Coronavirus 2 NEGATIVE NEGATIVE Final    Comment: (NOTE) SARS-CoV-2 target nucleic acids are NOT DETECTED. The SARS-CoV-2 RNA is generally detectable in upper and lower respiratory specimens during the acute phase of infection. Negative results do not preclude SARS-CoV-2 infection, do not rule out co-infections with other pathogens, and should not be used as the sole basis for treatment or other  patient management decisions. Negative results must be combined with clinical observations, patient history, and epidemiological information. The expected result is Negative. Fact Sheet for Patients: SugarRoll.be Fact Sheet for Healthcare Providers: https://www.woods-mathews.com/ This test is not yet approved or cleared by the [place] FDA and  has been authorized for detection and/or diagnosis of SARS-CoV-2 by FDA under an Emergency Use Authorization (EUA). This EUA will remain  in effect (meaning this test can be used) for the duration of the COVID-19 declaration under Section 56 4(b)(1) of the Act, 21 U.S.C. section 360bbb-3(b)(1), unless the authorization is terminated or revoked sooner. Performed at [facility], [place] [address] [place], [place] [address]   Aerobic/Anaerobic Culture (surgical/deep wound)     Status: Abnormal (Preliminary result)   Collection Time: 11/18/19  2:37 PM   Specimen: Gallbladder; Abscess  Result Value Ref Range Status   Specimen Description ABSCESS GALL BLADDER  Final   Special Requests NONE  Final   Gram Stain   Final    ABUNDANT WBC PRESENT,BOTH PMN AND MONONUCLEAR RARE GRAM POSITIVE COCCI FEW GRAM NEGATIVE RODS MODERATE GRAM VARIABLE ROD    Culture (A)  Final    MULTIPLE ORGANISMS PRESENT, NONE PREDOMINANT HOLDING FOR POSSIBLE ANAEROBE Performed at [facility], [address] [place], [place] [address]    Report Status PENDING  Incomplete     Scheduled Meds: . donepezil  10 mg Oral QHS   Continuous Infusions: . piperacillin-tazobactam (ZOSYN)  IV 3.375 g (11/20/19 1526)    Procedures/Studies: CT ABDOMEN PELVIS WO CONTRAST  Result Date: 11/13/2019 CLINICAL DATA:  Right-sided abdominal pain EXAM: CT ABDOMEN AND PELVIS WITHOUT CONTRAST TECHNIQUE: Multidetector CT imaging of the abdomen and pelvis was performed following the standard protocol without IV contrast. COMPARISON:   08/26/2019 FINDINGS: Lower chest: Unchanged 4 mm nodule within the medial aspect of the left lower lobe (series 4, image 18). 4 mm right lower lobe nodule (series 4, image 10) is also unchanged. No new or acute findings within the visualized lung bases. Hepatobiliary: Gallbladder is mildly dilated with circumferential gallbladder wall thickening and multiple hyperdense gallstones both in the dependent portions of the gallbladder as well as within the gallbladder neck (series 2, image 24). There is surrounding pericholecystic fat stranding and trace fluid. No well-defined fluid collection or abscess. No pneumoperitoneum. Liver is unremarkable. No intrahepatic biliary dilatation. Pancreas: Unremarkable. No pancreatic ductal dilatation or surrounding inflammatory changes. Spleen: Normal in size without focal abnormality. Adrenals/Urinary Tract: Unremarkable adrenal glands. Right renal atrophy and scarring with nonobstructing 1-2 mm calculus. Left kidney within normal limits. Ureters are nondilated. Urinary bladder is incompletely distended but appears otherwise unremarkable. Stomach/Bowel: Postsurgical changes to the bowel with anastomotic suture in the right lower quadrant. Extensive colonic diverticulosis. No pericolonic inflammatory changes. No evidence of bowel obstruction. Small hiatal hernia. Redemonstrated duodenal diverticulum. Vascular/Lymphatic: Aortic atherosclerosis. No enlarged abdominal or pelvic lymph nodes. Reproductive: Uterus and bilateral adnexa are unremarkable. Other: Pessary in place. Rectus diastasis. Numerous surgical clips in the right upper abdomen. Musculoskeletal: No acute or significant osseous findings. Previously seen tract from prior cholecystostomy tube appears well healed. IMPRESSION: 1. Persistently thickened gallbladder wall with numerous gallstones, including one at the level of the gallbladder neck. Surrounding pericholecystic fat stranding without a well-defined fluid collection  or evidence of abscess. Findings are concerning for acute on chronic cholecystitis. Consider nuclear medicine hepatobiliary scan to evaluate patency of the cystic and common bile ducts. 2. Extensive colonic diverticulosis without evidence of diverticulitis. Aortic Atherosclerosis (ICD10-I70.0). These results will be called to the ordering clinician or representative by the Radiologist Assistant, and communication documented in the PACS or zVision Dashboard. Electronically Signed   By: Davina Poke D.O.   On: 11/13/2019 15:58   DG Wrist 2 Views Right  Result Date: 11/19/2019 CLINICAL DATA:  Fall with wrist pain EXAM: RIGHT WRIST - 2 VIEW COMPARISON:  None. FINDINGS: There is no evidence of fracture or dislocation. Mild degenerative changes are seen in involving the carpal bones. Soft tissues are unremarkable. IMPRESSION: No acute abnormality. Electronically Signed   By: Zerita Boers M.D.   On: 11/19/2019 21:35   DG Hand 2 View Right  Result Date: 11/19/2019 CLINICAL DATA:  Fall with hand pain. EXAM: RIGHT HAND - 2 VIEW COMPARISON:  None. FINDINGS: There is no evidence of fracture or dislocation. Mild degenerative changes involving the carpal bones and distal interphalangeal joints. Soft tissues are unremarkable. IMPRESSION: No acute osseous injury. Electronically Signed   By: Zerita Boers M.D.   On: 11/19/2019 21:36   US Abdomen Limited  Result Date: 11/16/2019 CLINICAL DATA:  History of cholelithiasis with right upper quadrant pain. EXAM: ULTRASOUND ABDOMEN LIMITED RIGHT UPPER QUADRANT COMPARISON:  CT abdomen pelvis 11/13/2019. FINDINGS: Gallbladder: There are shadowing echogenic stones and  sludge in the gallbladder. Gallbladder wall is thickened, 8 mm. Pericholecystic fluid is present. Positive sonographic Murphy sign. Common bile duct: Diameter: Poorly visualized due to patient condition and inability to breath hold. Liver: No focal lesion identified. Within normal limits in parenchymal  echogenicity. Portal vein is patent on color Doppler imaging with normal direction of blood flow towards the liver. Other: None. IMPRESSION: Acute cholecystitis. Electronically Signed   By: Lorin Picket M.D.   On: 11/16/2019 16:04   IR Perc Cholecystostomy  Result Date: 11/18/2019 INDICATION: Recurrent cholecystitis EXAM: Ultrasound fluoroscopic percutaneous cholecystostomy MEDICATIONS: Patient is already receiving IV antibiotics as an inpatient ANESTHESIA/SEDATION: Moderate (conscious) sedation was employed during this procedure. A total of Versed 1.5 mg and Fentanyl 75 mcg was administered intravenously. Moderate Sedation Time: 17 minutes. The patient's level of consciousness and vital signs were monitored continuously by radiology nursing throughout the procedure under my direct supervision. FLUOROSCOPY TIME:  Fluoroscopy Time: 0 minutes 6 seconds (1 mGy). COMPLICATIONS: None immediate. PROCEDURE: Informed written consent was obtained from the patient's family after a thorough discussion of the procedural risks, benefits and alternatives. All questions were addressed. Maximal Sterile Barrier Technique was utilized including caps, mask, sterile gowns, sterile gloves, sterile drape, hand hygiene and skin antiseptic. A timeout was performed prior to the initiation of the procedure. Under sterile conditions and local anesthesia, ultrasound percutaneous needle access performed of the gallbladder. Needle position confirmed with ultrasound. Images obtained for documentation. There was return of purulent bile. Guidewire inserted followed by tract dilatation to insert a 10 [place] drain. Drain catheter position confirmed with ultrasound and fluoroscopy. Syringe aspiration yielded 70 cc purulent bile. Sample sent for culture. Catheter secured with a Prolene suture and connected to external gravity drainage bag. Sterile dressing applied. No immediate complication. Patient tolerated the procedure well. IMPRESSION:  Successful ultrasound and fluoroscopic 10 French cholecystostomy Electronically Signed   By: Jerilynn Mages.  Shick M.D.   On: 11/18/2019 14:35   DG Chest Port 1 View  Result Date: 11/16/2019 CLINICAL DATA:  Pre-admission chest x-ray 4 cholecystitis. EXAM: PORTABLE CHEST 1 VIEW COMPARISON:  October 09, 2017 FINDINGS: Very mild linear atelectasis is seen within the right lung base. There is no evidence of a pleural effusion or pneumothorax. The heart size and mediastinal contours are within normal limits. There is moderate severity calcification of the aortic arch. Multilevel degenerative changes are seen throughout the thoracic spine. IMPRESSION: 1. Very mild right basilar linear atelectasis. Electronically Signed   By: Virgina Norfolk M.D.   On: 11/16/2019 17:17    Orson Eva, DO  [facility] Pager [phone]  If 7PM-7AM, please contact night-coverage [email or web address] Password [email or web address] 11/20/2019, 6:34 PM   LOS: 4 days

## 2019-11-21 ENCOUNTER — Inpatient Hospital Stay
Admission: RE | Admit: 2019-11-21 | Discharge: 2019-12-21 | Disposition: A | Discharge status: Skilled Nursing Facility | Payer: Medicare Other | Source: Ambulatory Visit | Attending: Internal Medicine | Admitting: Internal Medicine

## 2019-11-21 DIAGNOSIS — K819 Cholecystitis, unspecified: Principal | ICD-10-CM

## 2019-11-21 LAB — CBC
HCT: 33.2 % — ABNORMAL LOW (ref 36.0–46.0)
Hemoglobin: 10.5 g/dL — ABNORMAL LOW (ref 12.0–15.0)
MCH: 30.5 pg (ref 26.0–34.0)
MCHC: 31.6 g/dL (ref 30.0–36.0)
MCV: 96.5 fL (ref 80.0–100.0)
Platelets: 265 10*3/uL (ref 150–400)
RBC: 3.44 MIL/uL — ABNORMAL LOW (ref 3.87–5.11)
RDW: 16.3 % — ABNORMAL HIGH (ref 11.5–15.5)
WBC: 8.9 10*3/uL (ref 4.0–10.5)
nRBC: 0 % (ref 0.0–0.2)

## 2019-11-21 LAB — BASIC METABOLIC PANEL
Anion gap: 8 (ref 5–15)
BUN: 12 mg/dL (ref 8–23)
CO2: 26 mmol/L (ref 22–32)
Calcium: 8.1 mg/dL — ABNORMAL LOW (ref 8.9–10.3)
Chloride: 106 mmol/L (ref 98–111)
Creatinine, Ser: 0.86 mg/dL (ref 0.44–1.00)
GFR calc Af Amer: >60 mL/min (ref >60)
GFR calc non Af Amer: 59 mL/min — ABNORMAL LOW (ref >60)
Glucose, Bld: 95 mg/dL (ref 70–99)
Potassium: 3.5 mmol/L (ref 3.5–5.1)
Sodium: 140 mmol/L (ref 135–145)

## 2019-11-21 LAB — MAGNESIUM: Magnesium: 1.9 mg/dL (ref 1.7–2.4)

## 2019-11-21 MED ORDER — METRONIDAZOLE 500 MG PO TABS: 500.0000 mg | ORAL_TABLET | Freq: Three times a day (TID) | ORAL | 0 refills | Status: DC

## 2019-11-21 MED ORDER — AMOXICILLIN-POT CLAVULANATE 875-125 MG PO TABS: 1.0000 | ORAL_TABLET | Freq: Two times a day (BID) | ORAL | 0 refills | Status: DC

## 2019-11-21 MED ORDER — SODIUM CHLORIDE 0.9% FLUSH
5.0000 mL | Freq: Three times a day (TID) | INTRAVENOUS | Status: DC
  Administered 2019-11-21: 5 mL

## 2019-11-21 NOTE — Discharge Summary (Signed)
Physician Discharge Summary  Cynthia [name] [id number] DOB: [birth year] DOA: 11/16/2019  PCP: Hennie Duos, MD  Admit date: 11/16/2019 Discharge date: 11/21/2019  Admitted From: SNF Disposition:  SNF  Recommendations for Outpatient Follow-up:  1. Follow up with PCP in 1-2 weeks 2. Please obtain BMP/CBC in one week 3. Per IR--flush cholecystotomy drain daily with 10 cc sterile saline and record OP    Discharge Condition: Stable CODE STATUS: FULL Diet recommendation: Heart Healthy    Brief/Interim Summary: 86 year old female with a history of dementia, hypertension, breast cancer, cholecystitis presenting from [name] secondary to abdominal pain for approximately 3 to 4 days. The patient had a CT of the abdomen and pelvis on 11/13/2019 which showed persistently thickened gallbladder with numerous gallstones including one at the level of the gallbladder neck. There was surrounding pericholecystic fluid and fat stranding without well-defined abscess. As result, the patient was sent to the emergency department for further evaluation. Initial WBC was 7.3 with AST 103, ALT 81, alkaline phosphatase 84, total bilirubin 0.8, lipase 28. Right upper quadrant ultrasound showed echogenic stones, sludge in the gallbladder, thickened gallbladder wall, pericholecystic fluid. General surgery was consulted. She was not felt to be a good surgical candidate. IR was consulted for cholecystotomy tube placement.  This was placed on 11/18/19 draining purulent material.  She was placed on zosyn with clinical improvement and decrease WBC.  She will follow up with IR for cholecystotomy drain maintenance.   Her diet was advanced and she tolerated it.  She will d/c back to snf with amox/clav and metronidazole x 7 more days.  Discharge Diagnoses:  Acute on chronic cholecystitis/cholelithiasis -Case discussed with general surgery, Dr. Arnoldo Morale -11/18/2019--percholecystotomy tube -Patient had  previous cholecystotomy tube which was exchanged on 06/09/2019 -Continue antibiotics IV--change to zosyn pending culture -GB culture--polymicrobial -d/c back to SNF with amox/clav+metronidazole x 7 more days  Dementia with behavioral disturbance -The patient has had episodes of agitation since hospitalization consistent with hospital delirium -Haldol as needed agitation -Continue Aricept  Essential hypertension -Not on any medications presently -BP is acceptable  Chronic constipation -miralax prn  Fall -pt had unwitnessed fall 1/28 pm -pt got up with minimal assist -denies hitting head -mentation at baseline -xray right wrist--neg  Hypokalemia -replete  Hypernatremia -continue hypotonic IVF another 24 hours  Discharge Instructions   Allergies as of 11/21/2019   No Known Allergies     Medication List    STOP taking these medications   acetaminophen 325 MG tablet Commonly known as: TYLENOL     TAKE these medications   amoxicillin-clavulanate 875-125 MG tablet Commonly known as: Augmentin Take 1 tablet by mouth 2 (two) times daily. X 7 days   Cholecalciferol 25 MCG (1000 UT) tablet Take 1,000 Units by mouth at bedtime.   donepezil 10 MG tablet Commonly known as: ARICEPT Take 1 tablet by mouth at bedtime.   ENSURE ENLIVE PO Take 1.5 kcal by mouth 2 (two) times daily between meals.   metroNIDAZOLE 500 MG tablet Commonly known as: Flagyl Take 1 tablet (500 mg total) by mouth 3 (three) times daily. X 7 days   polyethylene glycol 17 g packet Commonly known as: MIRALAX / GLYCOLAX Take 17 g by mouth daily as needed for mild constipation or moderate constipation.   Tums 500 MG chewable tablet Generic drug: calcium carbonate Chew 1 tablet by mouth daily.   Vitamin B-12 1000 MCG Subl Place 1 tablet under the tongue at bedtime.      Follow-up  Information    [name], MD Follow up in 6 week(s).   Specialties: Interventional Radiology,  Radiology Why: IR to see pt as OP 6 weeks; we will call pt with time and date of appt; flush daily 5 cc sterile saline; record output; call [phone] with questions Contact information: [address] [place] [address] [phone]          No Known Allergies  Consultations:  IR  General surgery   Procedures/Studies: CT ABDOMEN PELVIS WO CONTRAST  Result Date: 11/13/2019 CLINICAL DATA:  Right-sided abdominal pain EXAM: CT ABDOMEN AND PELVIS WITHOUT CONTRAST TECHNIQUE: Multidetector CT imaging of the abdomen and pelvis was performed following the standard protocol without IV contrast. COMPARISON:  08/26/2019 FINDINGS: Lower chest: Unchanged 4 mm nodule within the medial aspect of the left lower lobe (series 4, image 18). 4 mm right lower lobe nodule (series 4, image 10) is also unchanged. No new or acute findings within the visualized lung bases. Hepatobiliary: Gallbladder is mildly dilated with circumferential gallbladder wall thickening and multiple hyperdense gallstones both in the dependent portions of the gallbladder as well as within the gallbladder neck (series 2, image 24). There is surrounding pericholecystic fat stranding and trace fluid. No well-defined fluid collection or abscess. No pneumoperitoneum. Liver is unremarkable. No intrahepatic biliary dilatation. Pancreas: Unremarkable. No pancreatic ductal dilatation or surrounding inflammatory changes. Spleen: Normal in size without focal abnormality. Adrenals/Urinary Tract: Unremarkable adrenal glands. Right renal atrophy and scarring with nonobstructing 1-2 mm calculus. Left kidney within normal limits. Ureters are nondilated. Urinary bladder is incompletely distended but appears otherwise unremarkable. Stomach/Bowel: Postsurgical changes to the bowel with anastomotic suture in the right lower quadrant. Extensive colonic diverticulosis. No pericolonic inflammatory changes. No evidence of bowel obstruction. Small  hiatal hernia. Redemonstrated duodenal diverticulum. Vascular/Lymphatic: Aortic atherosclerosis. No enlarged abdominal or pelvic lymph nodes. Reproductive: Uterus and bilateral adnexa are unremarkable. Other: Pessary in place. Rectus diastasis. Numerous surgical clips in the right upper abdomen. Musculoskeletal: No acute or significant osseous findings. Previously seen tract from prior cholecystostomy tube appears well healed. IMPRESSION: 1. Persistently thickened gallbladder wall with numerous gallstones, including one at the level of the gallbladder neck. Surrounding pericholecystic fat stranding without a well-defined fluid collection or evidence of abscess. Findings are concerning for acute on chronic cholecystitis. Consider nuclear medicine hepatobiliary scan to evaluate patency of the cystic and common bile ducts. 2. Extensive colonic diverticulosis without evidence of diverticulitis. Aortic Atherosclerosis (ICD10-I70.0). These results will be called to the ordering clinician or representative by the Radiologist Assistant, and communication documented in the PACS or zVision Dashboard. Electronically Signed   By: Davina Poke D.O.   On: 11/13/2019 15:58   DG Wrist 2 Views Right  Result Date: 11/19/2019 CLINICAL DATA:  Fall with wrist pain EXAM: RIGHT WRIST - 2 VIEW COMPARISON:  None. FINDINGS: There is no evidence of fracture or dislocation. Mild degenerative changes are seen in involving the carpal bones. Soft tissues are unremarkable. IMPRESSION: No acute abnormality. Electronically Signed   By: Zerita Boers M.D.   On: 11/19/2019 21:35   DG Hand 2 View Right  Result Date: 11/19/2019 CLINICAL DATA:  Fall with hand pain. EXAM: RIGHT HAND - 2 VIEW COMPARISON:  None. FINDINGS: There is no evidence of fracture or dislocation. Mild degenerative changes involving the carpal bones and distal interphalangeal joints. Soft tissues are unremarkable. IMPRESSION: No acute osseous injury. Electronically Signed    By: Zerita Boers M.D.   On: 11/19/2019 21:36  US Abdomen Limited  Result Date: 11/16/2019 CLINICAL DATA:  History of cholelithiasis with right upper quadrant pain. EXAM: ULTRASOUND ABDOMEN LIMITED RIGHT UPPER QUADRANT COMPARISON:  CT abdomen pelvis 11/13/2019. FINDINGS: Gallbladder: There are shadowing echogenic stones and sludge in the gallbladder. Gallbladder wall is thickened, 8 mm. Pericholecystic fluid is present. Positive sonographic Murphy sign. Common bile duct: Diameter: Poorly visualized due to patient condition and inability to breath hold. Liver: No focal lesion identified. Within normal limits in parenchymal echogenicity. Portal vein is patent on color Doppler imaging with normal direction of blood flow towards the liver. Other: None. IMPRESSION: Acute cholecystitis. Electronically Signed   By: Lorin Picket M.D.   On: 11/16/2019 16:04   IR Perc Cholecystostomy  Result Date: 11/18/2019 INDICATION: Recurrent cholecystitis EXAM: Ultrasound fluoroscopic percutaneous cholecystostomy MEDICATIONS: Patient is already receiving IV antibiotics as an inpatient ANESTHESIA/SEDATION: Moderate (conscious) sedation was employed during this procedure. A total of Versed 1.5 mg and Fentanyl 75 mcg was administered intravenously. Moderate Sedation Time: 17 minutes. The patient's level of consciousness and vital signs were monitored continuously by radiology nursing throughout the procedure under my direct supervision. FLUOROSCOPY TIME:  Fluoroscopy Time: 0 minutes 6 seconds (1 mGy). COMPLICATIONS: None immediate. PROCEDURE: Informed written consent was obtained from the patient's family after a thorough discussion of the procedural risks, benefits and alternatives. All questions were addressed. Maximal Sterile Barrier Technique was utilized including caps, mask, sterile gowns, sterile gloves, sterile drape, hand hygiene and skin antiseptic. A timeout was performed prior to the initiation of the procedure.  Under sterile conditions and local anesthesia, ultrasound percutaneous needle access performed of the gallbladder. Needle position confirmed with ultrasound. Images obtained for documentation. There was return of purulent bile. Guidewire inserted followed by tract dilatation to insert a 10 [place] drain. Drain catheter position confirmed with ultrasound and fluoroscopy. Syringe aspiration yielded 70 cc purulent bile. Sample sent for culture. Catheter secured with a Prolene suture and connected to external gravity drainage bag. Sterile dressing applied. No immediate complication. Patient tolerated the procedure well. IMPRESSION: Successful ultrasound and fluoroscopic 10 French cholecystostomy Electronically Signed   By: Jerilynn Mages.  Shick M.D.   On: 11/18/2019 14:35   DG Chest Port 1 View  Result Date: 11/16/2019 CLINICAL DATA:  Pre-admission chest x-ray 4 cholecystitis. EXAM: PORTABLE CHEST 1 VIEW COMPARISON:  October 09, 2017 FINDINGS: Very mild linear atelectasis is seen within the right lung base. There is no evidence of a pleural effusion or pneumothorax. The heart size and mediastinal contours are within normal limits. There is moderate severity calcification of the aortic arch. Multilevel degenerative changes are seen throughout the thoracic spine. IMPRESSION: 1. Very mild right basilar linear atelectasis. Electronically Signed   By: Virgina Norfolk M.D.   On: 11/16/2019 17:17         Discharge Exam: Vitals:   11/20/19 2212 11/21/19 0520  BP: (!) 140/56 (!) 125/55  Pulse: 82 74  Resp: 20 18  Temp: 98.3 F (36.8 C) 97.9 F (36.6 C)  SpO2: 95% 92%   Vitals:   11/20/19 0419 11/20/19 1257 11/20/19 2212 11/21/19 0520  BP: (!) 133/50 130/63 (!) 140/56 (!) 125/55  Pulse: 81 72 82 74  Resp: 16 16 20 18   Temp: 98 F (36.7 C) 98.1 F (36.7 C) 98.3 F (36.8 C) 97.9 F (36.6 C)  TempSrc: Oral Oral Oral Oral  SpO2: 93% 95% 95% 92%  Weight:      Height:        General: Pt is alert,  awake, not  in acute distress Cardiovascular: RRR, S1/S2 +, no rubs, no gallops Respiratory: poor inspiratory effort but CTA bilaterally, no wheezing, no rhonchi Abdominal: Soft,RUQ pain, ND, bowel sounds + Extremities: no edema, no cyanosis   The results of significant diagnostics from this hospitalization (including imaging, microbiology, ancillary and laboratory) are listed below for reference.    Significant Diagnostic Studies: CT ABDOMEN PELVIS WO CONTRAST  Result Date: 11/13/2019 CLINICAL DATA:  Right-sided abdominal pain EXAM: CT ABDOMEN AND PELVIS WITHOUT CONTRAST TECHNIQUE: Multidetector CT imaging of the abdomen and pelvis was performed following the standard protocol without IV contrast. COMPARISON:  08/26/2019 FINDINGS: Lower chest: Unchanged 4 mm nodule within the medial aspect of the left lower lobe (series 4, image 18). 4 mm right lower lobe nodule (series 4, image 10) is also unchanged. No new or acute findings within the visualized lung bases. Hepatobiliary: Gallbladder is mildly dilated with circumferential gallbladder wall thickening and multiple hyperdense gallstones both in the dependent portions of the gallbladder as well as within the gallbladder neck (series 2, image 24). There is surrounding pericholecystic fat stranding and trace fluid. No well-defined fluid collection or abscess. No pneumoperitoneum. Liver is unremarkable. No intrahepatic biliary dilatation. Pancreas: Unremarkable. No pancreatic ductal dilatation or surrounding inflammatory changes. Spleen: Normal in size without focal abnormality. Adrenals/Urinary Tract: Unremarkable adrenal glands. Right renal atrophy and scarring with nonobstructing 1-2 mm calculus. Left kidney within normal limits. Ureters are nondilated. Urinary bladder is incompletely distended but appears otherwise unremarkable. Stomach/Bowel: Postsurgical changes to the bowel with anastomotic suture in the right lower quadrant. Extensive colonic diverticulosis. No  pericolonic inflammatory changes. No evidence of bowel obstruction. Small hiatal hernia. Redemonstrated duodenal diverticulum. Vascular/Lymphatic: Aortic atherosclerosis. No enlarged abdominal or pelvic lymph nodes. Reproductive: Uterus and bilateral adnexa are unremarkable. Other: Pessary in place. Rectus diastasis. Numerous surgical clips in the right upper abdomen. Musculoskeletal: No acute or significant osseous findings. Previously seen tract from prior cholecystostomy tube appears well healed. IMPRESSION: 1. Persistently thickened gallbladder wall with numerous gallstones, including one at the level of the gallbladder neck. Surrounding pericholecystic fat stranding without a well-defined fluid collection or evidence of abscess. Findings are concerning for acute on chronic cholecystitis. Consider nuclear medicine hepatobiliary scan to evaluate patency of the cystic and common bile ducts. 2. Extensive colonic diverticulosis without evidence of diverticulitis. Aortic Atherosclerosis (ICD10-I70.0). These results will be called to the ordering clinician or representative by the Radiologist Assistant, and communication documented in the PACS or zVision Dashboard. Electronically Signed   By: Davina Poke D.O.   On: 11/13/2019 15:58   DG Wrist 2 Views Right  Result Date: 11/19/2019 CLINICAL DATA:  Fall with wrist pain EXAM: RIGHT WRIST - 2 VIEW COMPARISON:  None. FINDINGS: There is no evidence of fracture or dislocation. Mild degenerative changes are seen in involving the carpal bones. Soft tissues are unremarkable. IMPRESSION: No acute abnormality. Electronically Signed   By: Zerita Boers M.D.   On: 11/19/2019 21:35   DG Hand 2 View Right  Result Date: 11/19/2019 CLINICAL DATA:  Fall with hand pain. EXAM: RIGHT HAND - 2 VIEW COMPARISON:  None. FINDINGS: There is no evidence of fracture or dislocation. Mild degenerative changes involving the carpal bones and distal interphalangeal joints. Soft tissues are  unremarkable. IMPRESSION: No acute osseous injury. Electronically Signed   By: Zerita Boers M.D.   On: 11/19/2019 21:36   US Abdomen Limited  Result Date: 11/16/2019 CLINICAL DATA:  History of cholelithiasis with right upper quadrant pain. EXAM:  ULTRASOUND ABDOMEN LIMITED RIGHT UPPER QUADRANT COMPARISON:  CT abdomen pelvis 11/13/2019. FINDINGS: Gallbladder: There are shadowing echogenic stones and sludge in the gallbladder. Gallbladder wall is thickened, 8 mm. Pericholecystic fluid is present. Positive sonographic Murphy sign. Common bile duct: Diameter: Poorly visualized due to patient condition and inability to breath hold. Liver: No focal lesion identified. Within normal limits in parenchymal echogenicity. Portal vein is patent on color Doppler imaging with normal direction of blood flow towards the liver. Other: None. IMPRESSION: Acute cholecystitis. Electronically Signed   By: Lorin Picket M.D.   On: 11/16/2019 16:04   IR Perc Cholecystostomy  Result Date: 11/18/2019 INDICATION: Recurrent cholecystitis EXAM: Ultrasound fluoroscopic percutaneous cholecystostomy MEDICATIONS: Patient is already receiving IV antibiotics as an inpatient ANESTHESIA/SEDATION: Moderate (conscious) sedation was employed during this procedure. A total of Versed 1.5 mg and Fentanyl 75 mcg was administered intravenously. Moderate Sedation Time: 17 minutes. The patient's level of consciousness and vital signs were monitored continuously by radiology nursing throughout the procedure under my direct supervision. FLUOROSCOPY TIME:  Fluoroscopy Time: 0 minutes 6 seconds (1 mGy). COMPLICATIONS: None immediate. PROCEDURE: Informed written consent was obtained from the patient's family after a thorough discussion of the procedural risks, benefits and alternatives. All questions were addressed. Maximal Sterile Barrier Technique was utilized including caps, mask, sterile gowns, sterile gloves, sterile drape, hand hygiene and skin  antiseptic. A timeout was performed prior to the initiation of the procedure. Under sterile conditions and local anesthesia, ultrasound percutaneous needle access performed of the gallbladder. Needle position confirmed with ultrasound. Images obtained for documentation. There was return of purulent bile. Guidewire inserted followed by tract dilatation to insert a 10 [place] drain. Drain catheter position confirmed with ultrasound and fluoroscopy. Syringe aspiration yielded 70 cc purulent bile. Sample sent for culture. Catheter secured with a Prolene suture and connected to external gravity drainage bag. Sterile dressing applied. No immediate complication. Patient tolerated the procedure well. IMPRESSION: Successful ultrasound and fluoroscopic 10 French cholecystostomy Electronically Signed   By: Jerilynn Mages.  Shick M.D.   On: 11/18/2019 14:35   DG Chest Port 1 View  Result Date: 11/16/2019 CLINICAL DATA:  Pre-admission chest x-ray 4 cholecystitis. EXAM: PORTABLE CHEST 1 VIEW COMPARISON:  October 09, 2017 FINDINGS: Very mild linear atelectasis is seen within the right lung base. There is no evidence of a pleural effusion or pneumothorax. The heart size and mediastinal contours are within normal limits. There is moderate severity calcification of the aortic arch. Multilevel degenerative changes are seen throughout the thoracic spine. IMPRESSION: 1. Very mild right basilar linear atelectasis. Electronically Signed   By: Virgina Norfolk M.D.   On: 11/16/2019 17:17     Microbiology: Recent Results (from the past 240 hour(s))  SARS CORONAVIRUS 2 (Henson Fraticelli 6-24 HRS) Nasopharyngeal Nasopharyngeal Swab     Status: None   Collection Time: 11/16/19  4:49 PM   Specimen: Nasopharyngeal Swab  Result Value Ref Range Status   SARS Coronavirus 2 NEGATIVE NEGATIVE Final    Comment: (NOTE) SARS-CoV-2 target nucleic acids are NOT DETECTED. The SARS-CoV-2 RNA is generally detectable in upper and lower respiratory specimens during  the acute phase of infection. Negative results do not preclude SARS-CoV-2 infection, do not rule out co-infections with other pathogens, and should not be used as the sole basis for treatment or other patient management decisions. Negative results must be combined with clinical observations, patient history, and epidemiological information. The expected result is Negative. Fact Sheet for Patients: SugarRoll.be Fact Sheet for Healthcare Providers: https://www.woods-mathews.com/ This  test is not yet approved or cleared by the [place] and  has been authorized for detection and/or diagnosis of SARS-CoV-2 by FDA under an Emergency Use Authorization (EUA). This EUA will remain  in effect (meaning this test can be used) for the duration of the COVID-19 declaration under Section 56 4(b)(1) of the Act, 21 U.S.C. section 360bbb-3(b)(1), unless the authorization is terminated or revoked sooner. Performed at [facility], [place] [address], [place], [place] [address]   Aerobic/Anaerobic Culture (surgical/deep wound)     Status: Abnormal (Preliminary result)   Collection Time: 11/18/19  2:37 PM   Specimen: Gallbladder; Abscess  Result Value Ref Range Status   Specimen Description ABSCESS GALL BLADDER  Final   Special Requests NONE  Final   Gram Stain   Final    ABUNDANT WBC PRESENT,BOTH PMN AND MONONUCLEAR RARE GRAM POSITIVE COCCI FEW GRAM NEGATIVE RODS MODERATE GRAM VARIABLE ROD    Culture (A)  Final    MULTIPLE ORGANISMS PRESENT, NONE PREDOMINANT HOLDING FOR POSSIBLE ANAEROBE Performed at [facility], [place] [address], [place], [place] [address]    Report Status PENDING  Incomplete     Labs: Basic Metabolic Panel: Recent Labs  Lab 11/16/19 1620 11/16/19 1620 11/17/19 0634 11/17/19 0634 11/19/19 0606 11/19/19 0606 11/20/19 0516 11/21/19 0533  NA 138  --  145  --  148*  --  145 140  K 3.7   < > 3.6   < > 3.7   < > 3.3*  3.5  CL 105  --  112*  --  114*  --  109 106  CO2 21*  --  24  --  26  --  27 26  GLUCOSE 122*  --  116*  --  88  --  99 95  BUN 31*  --  26*  --  20  --  16 12  CREATININE 1.05*  --  0.95  --  0.95  --  0.96 0.86  CALCIUM 8.5*  --  8.4*  --  8.5*  --  8.3* 8.1*  MG  --   --   --   --  2.3  --   --  1.9   < > = values in this interval not displayed.   Liver Function Tests: Recent Labs  Lab 11/16/19 1620 11/17/19 0634 11/19/19 0606 11/20/19 0516  AST 103* 66* 17 14*  ALT 81* 75* 38 25  ALKPHOS 84 76 72 59  BILITOT 0.8 0.7 1.0 0.7  PROT 7.0 6.4* 6.4* 5.8*  ALBUMIN 3.0* 2.5* 2.5* 2.3*   Recent Labs  Lab 11/16/19 1620  LIPASE 28   No results for input(s): AMMONIA in the last 168 hours. CBC: Recent Labs  Lab 11/16/19 1620 11/17/19 0634 11/19/19 0606 11/20/19 0516 11/21/19 0533  WBC 10.3 9.6 12.3* 11.4* 8.9  NEUTROABS 8.5*  --   --   --   --   HGB 12.1 11.2* 11.2* 10.0* 10.5*  HCT 39.3 34.8* 35.2* 31.4* 33.2*  MCV 98.5 94.6 95.4 94.9 96.5  PLT 233 243 265 262 265   Cardiac Enzymes: No results for input(s): CKTOTAL, CKMB, CKMBINDEX, TROPONINI in the last 168 hours. BNP: Invalid input(s): POCBNP CBG: No results for input(s): GLUCAP in the last 168 hours.  Time coordinating discharge:  36 minutes  Signed:  Orson Eva, DO [facility] Pager: [phone] 11/21/2019, 9:07 AM

## 2019-11-21 NOTE — TOC Transition Note (Signed)
Transition of Care [facility]) - CM/SW Discharge Note   Patient Details  Name: Cynthia [name] MRN: [id number] Date of Birth: [birth year]  Transition of Care [facility]) CM/SW Contact:  Nikeshia Keetch, Chauncey Reading, RN Phone Number: 11/21/2019, 9:32 AM   Clinical Narrative:   Patient discharging back to [facility] today. Bedside RN to call report to [place] at [phone]. DC clinicals sent via Hub and DC summary printed and will be sent with patient per Brittany's request. Unable to reach family to notify of DC.     Final next level of care: [place] Barriers to Discharge: Barriers Resolved     Discharge Placement              Patient chooses bed at: [facility] Patient to be transferred to facility by: [facility] staff          Readmission Risk Interventions No flowsheet data found.

## 2019-11-21 NOTE — Progress Notes (Signed)
   Perc chole drain placed 1/27 in IR- Dr Annamaria Boots  Cholelithiasis  Perc chole drain Originally placed 01/2017 Last exchange 05/2019 Accidentally removed 06/2019 in SNF Not replaced per Dr Constance Haw per notes   New symptoms and cholecystitis;  and new cholecystostomy placed 11/18/19  INpt at [facility] Will need follow up with IR 6-8 weeks  OP orders in place Pt will hear from IR clinic for time and date for appt.  Will need flush drain daily 5 cc sterile saline Record output Rx should be given for flushes please

## 2019-11-22 LAB — AEROBIC/ANAEROBIC CULTURE W GRAM STAIN (SURGICAL/DEEP WOUND)

## 2019-11-23 ENCOUNTER — Other Ambulatory Visit: Payer: Self-pay | Admitting: Adult Health

## 2019-11-23 ENCOUNTER — Encounter: Payer: Self-pay | Admitting: Adult Health

## 2019-11-23 ENCOUNTER — Non-Acute Institutional Stay (SKILLED_NURSING_FACILITY): Payer: Medicare Other | Admitting: Adult Health

## 2019-11-23 DIAGNOSIS — K5909 Other constipation: Secondary | ICD-10-CM | POA: Diagnosis not present

## 2019-11-23 DIAGNOSIS — F0391 Unspecified dementia with behavioral disturbance: Secondary | ICD-10-CM | POA: Diagnosis not present

## 2019-11-23 DIAGNOSIS — K8012 Calculus of gallbladder with acute and chronic cholecystitis without obstruction: Secondary | ICD-10-CM | POA: Diagnosis not present

## 2019-11-23 DIAGNOSIS — I10 Essential (primary) hypertension: Secondary | ICD-10-CM | POA: Diagnosis not present

## 2019-11-23 DIAGNOSIS — C50911 Malignant neoplasm of unspecified site of right female breast: Secondary | ICD-10-CM

## 2019-11-23 MED ORDER — TRAMADOL HCL 50 MG PO TABS: 50.0000 mg | ORAL_TABLET | Freq: Four times a day (QID) | ORAL | 0 refills | Status: DC | PRN

## 2019-11-23 NOTE — Progress Notes (Signed)
Location:    [place] Room Number: 157D Place of Service:  SNF (31) Phillips Grout NP    CODE STATUS: FULL CODE  No Known Allergies  Chief Complaint  Patient presents with  . Hospitalization Follow-up    Hospitalization Follow-up    HPI:  She is a 86 year old long term resident of this facility who has been hospitalized from 11-16-19 through 11-21-19. She was treated for acute cholecystitis with her bili drain replaced. There are no reports of uncontrolled pain; no changes in appetite; no reports of constipation. She continues to be followed for her chronic illnesses including: hypertension; constipation; dementia.   Past Medical History:  Diagnosis Date  . Breast cancer ([place])   . Cognitive communication deficit   . Dementia ([place])   . Difficulty in walking(719.7)   . Fall   . HTN (hypertension) 08/26/2016  . Muscle weakness (generalized)   . Rash and nonspecific skin eruption   . Thyroid disease    hypo  . Urinary tract infection, site not specified   . Vitamin B deficiency     Past Surgical History:  Procedure Laterality Date  . COLON SURGERY    . IR CATHETER TUBE CHANGE  05/07/2017  . IR EXCHANGE BILIARY DRAIN  07/02/2017  . IR EXCHANGE BILIARY DRAIN  07/10/2017  . IR EXCHANGE BILIARY DRAIN  08/28/2017  . IR EXCHANGE BILIARY DRAIN  10/11/2017  . IR EXCHANGE BILIARY DRAIN  10/28/2017  . IR EXCHANGE BILIARY DRAIN  12/23/2017  . IR EXCHANGE BILIARY DRAIN  01/23/2018  . IR EXCHANGE BILIARY DRAIN  02/17/2018  . IR EXCHANGE BILIARY DRAIN  03/20/2018  . IR EXCHANGE BILIARY DRAIN  04/17/2018  . IR EXCHANGE BILIARY DRAIN  05/29/2018  . IR EXCHANGE BILIARY DRAIN  07/24/2018  . IR EXCHANGE BILIARY DRAIN  09/22/2018  . IR EXCHANGE BILIARY DRAIN  11/20/2018  . IR EXCHANGE BILIARY DRAIN  02/11/2019  . IR EXCHANGE BILIARY DRAIN  04/14/2019  . IR EXCHANGE BILIARY DRAIN  06/09/2019  . IR PERC CHOLECYSTOSTOMY  01/27/2017  . IR PERC CHOLECYSTOSTOMY  11/18/2019  . IR  RADIOLOGIST EVAL & MGMT  03/05/2017  . MASTECTOMY, PARTIAL Right     Social History   Socioeconomic History  . Marital status: Widowed    Spouse name: Not on file  . Number of children: Not on file  . Years of education: Not on file  . Highest education level: Not on file  Occupational History  . Not on file  Tobacco Use  . Smoking status: Never Smoker  . Smokeless tobacco: Never Used  Substance and Sexual Activity  . Alcohol use: No  . Drug use: No  . Sexual activity: Not on file  Other Topics Concern  . Not on file  Social History Narrative  . Not on file   Social Determinants of Health   Financial Resource Strain:   . Difficulty of Paying Living Expenses: Not on file  Food Insecurity:   . Worried About Charity fundraiser in the Last Year: Not on file  . Ran Out of Food in the Last Year: Not on file  Transportation Needs:   . Lack of Transportation (Medical): Not on file  . Lack of Transportation (Non-Medical): Not on file  Physical Activity:   . Days of Exercise per Week: Not on file  . Minutes of Exercise per Session: Not on file  Stress:   . Feeling of Stress : Not on  file  Social Connections:   . Frequency of Communication with Friends and Family: Not on file  . Frequency of Social Gatherings with Friends and Family: Not on file  . Attends Religious Services: Not on file  . Active Member of Clubs or Organizations: Not on file  . Attends Archivist Meetings: Not on file  . Marital Status: Not on file  Intimate Partner Violence:   . Fear of Current or Ex-Partner: Not on file  . Emotionally Abused: Not on file  . Physically Abused: Not on file  . Sexually Abused: Not on file   Family History  Problem Relation Age of Onset  . Cancer Father        ? primary  . Diabetes Neg Hx   . Heart disease Neg Hx   . Stroke Neg Hx       VITAL SIGNS BP 98/65   Pulse 81   Temp (!) 97 F (36.1 C) (Oral)   Resp 18   Ht 5\' 3"  (1.6 m)   Wt 143 lb 3.2 oz  (65 kg)   SpO2 93%   BMI 25.37 kg/m   Outpatient Encounter Medications as of 11/23/2019  Medication Sig  . amoxicillin-clavulanate (AUGMENTIN) 875-125 MG tablet Take 1 tablet by mouth 2 (two) times daily. X 7 days  . calcium carbonate (TUMS) 500 MG chewable tablet Chew 1 tablet by mouth daily.  . Cholecalciferol 1000 units tablet Take 1,000 Units by mouth daily.   . Cyanocobalamin (VITAMIN B-12) 1000 MCG SUBL Place 1 tablet under the tongue at bedtime.  . donepezil (ARICEPT) 10 MG tablet Take 1 tablet by mouth at bedtime.  . metroNIDAZOLE (FLAGYL) 500 MG tablet Take 1 tablet (500 mg total) by mouth 3 (three) times daily. X 7 days  . Nutritional Supplements (ENSURE ENLIVE PO) Take 1.5 kcal by mouth 2 (two) times daily between meals.  . polyethylene glycol (MIRALAX / GLYCOLAX) packet Take 17 g by mouth daily as needed for mild constipation or moderate constipation.   . Sodium Chloride Flush (NORMAL SALINE FLUSH) 0.9 % SOLN Inject into the vein daily. 5 cc   No facility-administered encounter medications on file as of 11/23/2019.     SIGNIFICANT DIAGNOSTIC EXAMS   PREVIOUS;   08-26-19: ct of abdomen  1. Interval removal of the previously seen percutaneous cholecystostomy tube. Cholelithiasis with findings concerning for acute on chronic cholecystitis. Clinical correlation is recommended. Ultrasound may provide better evaluation of the gallbladder. 2. Diffuse colonic diverticulosis. No bowel obstruction. 3. Aortic Atherosclerosis   TODAY  11-13-19: ct of abdomen and pelvis:  1. Persistently thickened gallbladder wall with numerous gallstones, including one at the level of the gallbladder neck. Surrounding pericholecystic fat stranding without a well-defined fluid collection or evidence of abscess. Findings are concerning for acute on chronic cholecystitis. Consider nuclear medicine hepatobiliary scan to evaluate patency of the cystic and common bile ducts. 2. Extensive colonic  diverticulosis without evidence of diverticulitis.  11-16-19: chest x-ray: Very mild right basilar linear atelectasis.   11-16-19: abdominal ultrasound: Acute cholecystitis.   11-18-19: chole tube placement: Successful ultrasound and fluoroscopic 10 French cholecystostomy      LABS REVIEWED: PREVIOUS:   02-17-19: wbc 6.1; hgb 12.9; hct 41.5; mcv 98.3 plt 269 glucose 92; bun 18; creat 0.91; k+ 4.3; na++ 139; ca 8.8 liver normal albumin 3.5  06-24-19; wbc 6.0; hgb 12.5; hct 40.1; mcv 97.1; plt 255; glucose 96; bun 17; creat 0.87; k+ 4.2; na++ 136; ca 8.6; albumin 3.5;  lipase 39 07-09-19: liver normal albumin 3.6 07-21-19: wbc 5.2; hgb 11.9; hct 38.5; mcv 97.0 plt 234; glucose 92; bun 19; creat 0.89; k+ 4.1; na++ 138; ca 8.6 08-26-19: wbc 11.8; hgb 11.3; hct 35.7; mcv 96.5 plt 227; glucose 97; bun 31; creat 1.02; k+ 3.8; na++ 139; ca 8.6; liver normal albumin 2.8    TODAY  11-16-19: wbc 10.3; hgb 12.1; hct 39.3 mcv 98.5 plt 233; glucose 122; bun 31; creat 1.05; k 3.7 na++ 138; ca 8.5 ast 103 alt 81; albumin 3.0 1-28--21: wbc 12.3; hgb 11.2; hct 35.2; mcv 95.4 plt 265; glucose 88; bun 20; creat 0.95 ;k+ 3.7; na++ 148; ca 8.5 liver normal albumin 2.5 mag 2.3 11-21-19: wbc 8.9; hgb 10.5; hct 33.2; mcv 96.5 plt 265; glucose 95; bun 12; creat 0.86; k+ 3.5; na++ 140; ca 8.1 mag 1.9    Review of Systems  Constitutional: Negative for malaise/fatigue.  Respiratory: Negative for cough and shortness of breath.   Cardiovascular: Negative for chest pain, palpitations and leg swelling.  Gastrointestinal: Negative for abdominal pain, constipation and heartburn.  Musculoskeletal: Negative for back pain, joint pain and myalgias.  Skin: Negative.   Neurological: Negative for dizziness.  Psychiatric/Behavioral: The patient is not nervous/anxious.     Physical Exam Constitutional:      General: She is not in acute distress.    Appearance: She is well-developed. She is not diaphoretic.  Neck:     Thyroid: No  thyromegaly.  Cardiovascular:     Rate and Rhythm: Normal rate and regular rhythm.     Pulses: Normal pulses.     Heart sounds: Normal heart sounds.  Pulmonary:     Effort: Pulmonary effort is normal. No respiratory distress.     Breath sounds: Normal breath sounds.  Chest:     Comments: History of right partial mastectomy  Abdominal:     General: Bowel sounds are normal. There is no distension.     Palpations: Abdomen is soft.     Tenderness: There is no abdominal tenderness.     Comments: History of right hemi colectomy  Bili drain in place   Musculoskeletal:     Cervical back: Neck supple.     Right lower leg: No edema.     Left lower leg: No edema.     Comments: Is able to move all extremities  History of C2 fracture     Lymphadenopathy:     Cervical: No cervical adenopathy.  Skin:    General: Skin is warm and dry.  Neurological:     Mental Status: She is alert. Mental status is at baseline.  Psychiatric:        Mood and Affect: Mood normal.       ASSESSMENT/ PLAN:  TODAY;   1. Calculus of gall bladder with acute cholecystitis and obstruction: bili tube has been replaced will complete abt and will monitor her status.   2. Malignant neoplasm of right female breast unspecified estrogen receptor unspecified site of breath: history of right partial mastectomy will monitor   3. Dementia without behavioral disturbance unspecified dementia type: no significant change in statu: weight is 143 pounds; will continue aricept 10 mg daily   4. Essential hypertension; is stable b/p 98/65 will monitor  5. Chronic constipation: is stable will continue miralax daily as needed   MD is aware of resident's narcotic use and is in agreement with current plan of care. We will attempt to wean resident as appropriate.  Ok Edwards NP [facility]  Adult Medicine  Contact [phone] Monday through Friday 8am- 5pm  After hours call [phone]

## 2019-11-25 ENCOUNTER — Non-Acute Institutional Stay (SKILLED_NURSING_FACILITY): Payer: Medicare Other | Admitting: Internal Medicine

## 2019-11-25 ENCOUNTER — Encounter: Payer: Self-pay | Admitting: Internal Medicine

## 2019-11-25 DIAGNOSIS — I10 Essential (primary) hypertension: Secondary | ICD-10-CM

## 2019-11-25 DIAGNOSIS — E559 Vitamin D deficiency, unspecified: Secondary | ICD-10-CM | POA: Diagnosis not present

## 2019-11-25 DIAGNOSIS — K8012 Calculus of gallbladder with acute and chronic cholecystitis without obstruction: Secondary | ICD-10-CM | POA: Diagnosis not present

## 2019-11-25 DIAGNOSIS — E538 Deficiency of other specified B group vitamins: Secondary | ICD-10-CM | POA: Diagnosis not present

## 2019-11-25 DIAGNOSIS — L02211 Cutaneous abscess of abdominal wall: Secondary | ICD-10-CM | POA: Diagnosis not present

## 2019-11-25 NOTE — Progress Notes (Signed)
: Provider:  Hennie Duos., MD Location:  [place] Room Number: 157-D Place of Service:  SNF [phone])  PCP: Hennie Duos, MD Patient Care Team: Hennie Duos, MD as PCP - General (Internal Medicine) Nyoka Cowden Phylis Bougie, NP as Nurse Practitioner ([place]) Center, [place] ([place])  Extended Emergency Contact Information Primary Emergency Contact: Netto,[name]  [place] Phone: [phone] Relation: Daughter     Allergies: Patient has no known allergies.  Chief Complaint  Patient presents with  . Readmit To SNF    Readmission to [facility]    HPI: Patient is a 86 y.o. female with dementia, hypertension, breast cancer, cholecystitis who presented from [facility] secondary to abdominal pain approximately 3 to 4 days.  Patient had a CT of the abdomen and pelvis on 1/22 which showed persistently thickened gallbladder with numerous gallstones including one at the level of the gallbladder neck.  There was surrounding.  Cholecystic fluid and fat stranding without a well-defined abscess.  As result the patient was sent to the emergency department where a right upper quadrant ultrasound showed echogenic stones, sludge in the gallbladder thickened gallbladder wall and pericholecystic fluid.  Patient was admitted to [facility] from 1/25-31 patient was seen by surgery and not felt to be a good surgical candidate.  IR was consulted for cholecystostomy tube which was placed on 1/27 and drained purulent material.  She was placed on Zosyn with clinical improvement.  She will follow up with IR for cholecystostomy drain maintenance.  Patient will have 7 days of Augmentin and Flagyl which nursing home.  Patient is DC to SNF for OT/PT and residential care.  Skilled nursing facility patient will be followed for dementia treated with Aricept,, B12 deficiency treated with sublingual replacement and  vitamin D deficiency treated with replacement.  Past Medical History:  Diagnosis Date  . Breast cancer ([place])   . Cognitive communication deficit   . Dementia ([place])   . Difficulty in walking(719.7)   . Fall   . HTN (hypertension) 08/26/2016  . Muscle weakness (generalized)   . Rash and nonspecific skin eruption   . Thyroid disease    hypo  . Urinary tract infection, site not specified   . Vitamin B deficiency     Past Surgical History:  Procedure Laterality Date  . COLON SURGERY    . IR CATHETER TUBE CHANGE  05/07/2017  . IR EXCHANGE BILIARY DRAIN  07/02/2017  . IR EXCHANGE BILIARY DRAIN  07/10/2017  . IR EXCHANGE BILIARY DRAIN  08/28/2017  . IR EXCHANGE BILIARY DRAIN  10/11/2017  . IR EXCHANGE BILIARY DRAIN  10/28/2017  . IR EXCHANGE BILIARY DRAIN  12/23/2017  . IR EXCHANGE BILIARY DRAIN  01/23/2018  . IR EXCHANGE BILIARY DRAIN  02/17/2018  . IR EXCHANGE BILIARY DRAIN  03/20/2018  . IR EXCHANGE BILIARY DRAIN  04/17/2018  . IR EXCHANGE BILIARY DRAIN  05/29/2018  . IR EXCHANGE BILIARY DRAIN  07/24/2018  . IR EXCHANGE BILIARY DRAIN  09/22/2018  . IR EXCHANGE BILIARY DRAIN  11/20/2018  . IR EXCHANGE BILIARY DRAIN  02/11/2019  . IR EXCHANGE BILIARY DRAIN  04/14/2019  . IR EXCHANGE BILIARY DRAIN  06/09/2019  . IR PERC CHOLECYSTOSTOMY  01/27/2017  . IR PERC CHOLECYSTOSTOMY  11/18/2019  . IR RADIOLOGIST EVAL & MGMT  03/05/2017  . MASTECTOMY, PARTIAL Right     Allergies as of 11/25/2019   No Known Allergies  Medication List    Notice   This visit is during an admission. Changes to the med list made in this visit will be reflected in the After Visit Summary of the admission.    Current Outpatient Medications on File Prior to Visit  Medication Sig Dispense Refill  . amoxicillin-clavulanate (AUGMENTIN) 875-125 MG tablet Take 1 tablet by mouth 2 (two) times daily. X 7 days 14 tablet 0  . calcium carbonate (TUMS) 500 MG chewable tablet Chew 1 tablet by mouth daily.    . Cholecalciferol 1000 units  tablet Take 1,000 Units by mouth daily.     . Cyanocobalamin (VITAMIN B-12) 1000 MCG SUBL Place 1 tablet under the tongue at bedtime.    . donepezil (ARICEPT) 10 MG tablet Take 1 tablet by mouth at bedtime.    . metroNIDAZOLE (FLAGYL) 500 MG tablet Take 1 tablet (500 mg total) by mouth 3 (three) times daily. X 7 days 21 tablet 0  . Nutritional Supplements (ENSURE ENLIVE PO) Take 1.5 kcal by mouth 2 (two) times daily between meals.    . Nutritional Supplements (NUTRITIONAL SUPPLEMENT PO) Take 1 each by mouth at bedtime. Magic Cup    . polyethylene glycol (MIRALAX / GLYCOLAX) packet Take 17 g by mouth daily as needed for mild constipation or moderate constipation.     . Sodium Chloride Flush (NORMAL SALINE FLUSH) 0.9 % SOLN Inject into the vein daily. 5 cc    . traMADol (ULTRAM) 50 MG tablet Take 1 tablet (50 mg total) by mouth every 6 (six) hours as needed for up to 7 days. 15 tablet 0   No current facility-administered medications on file prior to visit.     No orders of the defined types were placed in this encounter.   Immunization History  Administered Date(s) Administered  . Influenza-Unspecified 07/28/2014, 07/26/2017, 07/24/2018, 07/27/2019  . Moderna SARS-COVID-2 Vaccination 10/28/2019  . Pneumococcal Conjugate-13 06/21/2017  . Pneumococcal-Unspecified 08/01/2016  . Tdap 07/11/2017    Social History   Tobacco Use  . Smoking status: Never Smoker  . Smokeless tobacco: Never Used  Substance Use Topics  . Alcohol use: No    Family history is   Family History  Problem Relation Age of Onset  . Cancer Father        ? primary  . Diabetes Neg Hx   . Heart disease Neg Hx   . Stroke Neg Hx       Review of Systems  GENERAL:  no fevers, fatigue, appetite changes SKIN: No itching, or rash EYES: No eye pain, redness, discharge EARS: No earache, tinnitus, change in hearing NOSE: No congestion, drainage or bleeding  MOUTH/THROAT: No mouth or tooth pain, No sore  throat RESPIRATORY: No cough, wheezing, SOB CARDIAC: No chest pain, palpitations, lower extremity edema  GI: No abdominal pain, No N/V/D or constipation, No heartburn or reflux  GU: No dysuria, frequency or urgency, or incontinence  MUSCULOSKELETAL: No unrelieved bone/joint pain NEUROLOGIC: No headache, dizziness or focal weakness PSYCHIATRIC: No c/o anxiety or sadness   Vitals:   11/25/19 0831  BP: 121/70  Pulse: 75  Resp: 20  Temp: 98 F (36.7 C)  SpO2: 93%    SpO2 Readings from Last 1 Encounters:  11/25/19 93%   Body mass index is 25.37 kg/m.     Physical Exam  GENERAL APPEARANCE: Alert, conversant,  No acute distress.  SKIN: No diaphoresis rash; right partial mastectomy HEAD: Normocephalic, atraumatic  EYES: Conjunctiva/lids clear. Pupils round, reactive. EOMs intact.  EARS: External exam WNL, canals clear. Hearing grossly normal.  NOSE: No deformity or discharge.  MOUTH/THROAT: Lips w/o lesions  RESPIRATORY: Breathing is even, unlabored. Lung sounds are clear   CARDIOVASCULAR: Heart RRR no murmurs, rubs or gallops. No peripheral edema.   GASTROINTESTINAL: Abdomen is soft, non-tender, not distended w/ normal bowel sounds; bili drain in place. GENITOURINARY: Bladder non tender, not distended  MUSCULOSKELETAL: No abnormal joints or musculature NEUROLOGIC:  Cranial nerves 2-12 grossly intact. Moves all extremities  PSYCHIATRIC: Mood and affect appropriate with dementia, no behavioral issues  Patient Active Problem List   Diagnosis Date Noted  . Acute cholecystitis 11/16/2019  . COVID-19 virus infection 11/02/2019  . Abscess of skin of abdomen 08/29/2019  . Chronic constipation 12/20/2018  . S/P Cholecystostomy 01/31/2017  . Calculus of gallbladder with acute on chronic cholecystitis without obstruction 01/26/2017  . HTN (hypertension) 08/26/2016  . Breast cancer ([place]) 12/11/2013  . Benign neoplasm of colon 12/11/2013  . Dementia ([place]) 12/11/2013  . C2 cervical  fracture ([place]) 12/08/2013      Labs reviewed: Basic Metabolic Panel:    Component Value Date/Time   NA 140 11/21/2019 0533   NA 141 10/27/2015 0000   K 3.5 11/21/2019 0533   CL 106 11/21/2019 0533   CO2 26 11/21/2019 0533   GLUCOSE 95 11/21/2019 0533   BUN 12 11/21/2019 0533   BUN 13 10/27/2015 0000   CREATININE 0.86 11/21/2019 0533   CALCIUM 8.1 (L) 11/21/2019 0533   PROT 5.8 (L) 11/20/2019 0516   ALBUMIN 2.3 (L) 11/20/2019 0516   AST 14 (L) 11/20/2019 0516   ALT 25 11/20/2019 0516   ALKPHOS 59 11/20/2019 0516   BILITOT 0.7 11/20/2019 0516   GFRNONAA 59 (L) 11/21/2019 0533   GFRAA >60 11/21/2019 0533    Recent Labs    11/19/19 0606 11/20/19 0516 11/21/19 0533  NA 148* 145 140  K 3.7 3.3* 3.5  CL 114* 109 106  CO2 26 27 26   GLUCOSE 88 99 95  BUN 20 16 12   CREATININE 0.95 0.96 0.86  CALCIUM 8.5* 8.3* 8.1*  MG 2.3  --  1.9   Liver Function Tests: Recent Labs    11/17/19 0634 11/19/19 0606 11/20/19 0516  AST 66* 17 14*  ALT 75* 38 25  ALKPHOS 76 72 59  BILITOT 0.7 1.0 0.7  PROT 6.4* 6.4* 5.8*  ALBUMIN 2.5* 2.5* 2.3*   Recent Labs    06/24/19 0808 11/16/19 1620  LIPASE 39 28   No results for input(s): AMMONIA in the last 8760 hours. CBC: Recent Labs    08/26/19 1215 08/26/19 1215 10/29/19 1430 10/29/19 1430 11/16/19 1620 11/17/19 0634 11/19/19 0606 11/20/19 0516 11/21/19 0533  WBC 11.8*   < > 5.6   < > 10.3   < > 12.3* 11.4* 8.9  NEUTROABS 10.1*  --  3.9  --  8.5*  --   --   --   --   HGB 11.3*   < > 12.1   < > 12.1   < > 11.2* 10.0* 10.5*  HCT 35.7*   < > 38.4   < > 39.3   < > 35.2* 31.4* 33.2*  MCV 96.5   < > 98.2   < > 98.5   < > 95.4 94.9 96.5  PLT 227   < > 213   < > 233   < > 265 262 265   < > = values in this interval not displayed.  Lipid No results for input(s): CHOL, HDL, LDLCALC, TRIG in the last 8760 hours.  Cardiac Enzymes: No results for input(s): CKTOTAL, CKMB, CKMBINDEX, TROPONINI in the last 8760 hours. BNP: No  results for input(s): BNP in the last 8760 hours. No results found for: MICROALBUR No results found for: HGBA1C Lab Results  Component Value Date   TSH 3.877 01/02/2018   Lab Results  Component Value Date   [id number] 02/15/2018   No results found for: FOLATE Lab Results  Component Value Date   FERRITIN 32 10/29/2019    Imaging and Procedures obtained prior to SNF admission: No results found.   Not all labs, radiology exams or other studies done during hospitalization come through on my EPIC note; however they are reviewed by me.    Assessment and Plan  Acute on chronic cholecystitis/cholelithiasis-not considered a good surgical candidate; per cholecystostomy tube placed on 527 by IR; antibiotics changed to IV Zosyn; culture polymicrobial; DC back to SNF Augmentin and Flagyl SNF-admitted back for OT/PT and residential care; continue Augmentin 875 2 times daily for 7 more day and Flagyl 500 mg 3 times daily for 7 days  Dementia with behaviors SNF-patient had hospital delirium requiring Haldol which has improved SNF-continue Aricept 10 mg daily  Hypertension SNF-not requiring meds  Vitamin D deficiency SNF-continue 1000 units daily  B12 deficiency SNF-continue 1000 mcg sublingual nightly   Time spent greater than 35 minutes;> 50% of time with patient was spent reviewing records, labs, tests and studies, counseling and developing plan of care  Hennie Duos, MD

## 2019-11-26 ENCOUNTER — Ambulatory Visit (HOSPITAL_COMMUNITY)
Admission: RE | Admit: 2019-11-26 | Discharge: 2019-11-26 | Disposition: A | Discharge status: Home / Self Care | Payer: Medicare Other | Source: Ambulatory Visit | Attending: Radiology | Admitting: Radiology

## 2019-11-26 DIAGNOSIS — Y839 Surgical procedure, unspecified as the cause of abnormal reaction of the patient, or of later complication, without mention of misadventure at the time of the procedure: Secondary | ICD-10-CM | POA: Diagnosis not present

## 2019-11-26 DIAGNOSIS — K8012 Calculus of gallbladder with acute and chronic cholecystitis without obstruction: Secondary | ICD-10-CM | POA: Insufficient documentation

## 2019-11-26 DIAGNOSIS — T85518A Breakdown (mechanical) of other gastrointestinal prosthetic devices, implants and grafts, initial encounter: Secondary | ICD-10-CM | POA: Diagnosis not present

## 2019-11-26 DIAGNOSIS — T85638A Leakage of other specified internal prosthetic devices, implants and grafts, initial encounter: Secondary | ICD-10-CM | POA: Diagnosis not present

## 2019-11-26 HISTORY — PX: IR EXCHANGE BILIARY DRAIN: IMG6046

## 2019-11-26 MED ORDER — IOHEXOL 300 MG/ML  SOLN
50.0000 mL | Freq: Once | INTRAMUSCULAR | Status: AC | PRN
  Administered 2019-11-26: 15 mL

## 2019-11-26 MED ORDER — LIDOCAINE HCL 1 % IJ SOLN
INTRAMUSCULAR | Status: AC
  Filled 2019-11-26: qty 20

## 2019-11-26 MED ORDER — LIDOCAINE HCL 1 % IJ SOLN
INTRAMUSCULAR | Status: DC | PRN
  Administered 2019-11-26: 5 mL

## 2019-11-26 NOTE — Procedures (Signed)
Interventional Radiology Procedure:   Indications: Cholecystostomy tube not draining  Procedure: Drain injection with flushing.  Placement of new suture.  Findings: Drain in small gallbladder.  Thick green bilious fluid removed.  Drain flushed with saline until fluid turned serosanguineous.   Complications: None     EBL: None  Plan: Recommend daily drain flushes with sterile saline and drain exchange in approximately 6 weeks.     Henson Fraticelli R. Anselm Pancoast, MD  Pager: [phone]

## 2019-11-28 ENCOUNTER — Encounter: Payer: Self-pay | Admitting: Internal Medicine

## 2019-11-28 DIAGNOSIS — E559 Vitamin D deficiency, unspecified: Secondary | ICD-10-CM | POA: Insufficient documentation

## 2019-11-28 DIAGNOSIS — E538 Deficiency of other specified B group vitamins: Secondary | ICD-10-CM | POA: Insufficient documentation

## 2019-11-30 ENCOUNTER — Encounter (HOSPITAL_COMMUNITY): Payer: Self-pay

## 2019-11-30 ENCOUNTER — Non-Acute Institutional Stay (SKILLED_NURSING_FACILITY): Payer: Medicare Other | Admitting: Adult Health

## 2019-11-30 DIAGNOSIS — K8012 Calculus of gallbladder with acute and chronic cholecystitis without obstruction: Secondary | ICD-10-CM | POA: Diagnosis not present

## 2019-11-30 DIAGNOSIS — F0391 Unspecified dementia with behavioral disturbance: Secondary | ICD-10-CM

## 2019-11-30 DIAGNOSIS — I10 Essential (primary) hypertension: Secondary | ICD-10-CM | POA: Diagnosis not present

## 2019-11-30 NOTE — Progress Notes (Signed)
Location:    [place] Room Number: 157D Place of Service:  SNF (31) Phillips Grout NP    CODE STATUS: FULL CODE  No Known Allergies  Chief Complaint  Patient presents with  . Medical Management of Chronic Issues         Calculus of gall bladder with acute cholecystitis and obstruction:  Dementia without behavioral disturbance unspecified dementia type:  Essential hypertension:   Weekly follow up for the first 30 days following hospitalization.     HPI:  She is a 86 year old long term resident of this facility being seen for the management of her chronic illnesses; gall stones; dementia hypertension. there are no reports of uncontrolled pain; no reports of anxiety or agitation. Her appetite is poor.   Past Medical History:  Diagnosis Date  . Breast cancer ([place])   . Cognitive communication deficit   . Dementia ([place])   . Difficulty in walking(719.7)   . Fall   . HTN (hypertension) 08/26/2016  . Muscle weakness (generalized)   . Rash and nonspecific skin eruption   . Thyroid disease    hypo  . Urinary tract infection, site not specified   . Vitamin B deficiency     Past Surgical History:  Procedure Laterality Date  . COLON SURGERY    . IR CATHETER TUBE CHANGE  05/07/2017  . IR EXCHANGE BILIARY DRAIN  07/02/2017  . IR EXCHANGE BILIARY DRAIN  07/10/2017  . IR EXCHANGE BILIARY DRAIN  08/28/2017  . IR EXCHANGE BILIARY DRAIN  10/11/2017  . IR EXCHANGE BILIARY DRAIN  10/28/2017  . IR EXCHANGE BILIARY DRAIN  12/23/2017  . IR EXCHANGE BILIARY DRAIN  01/23/2018  . IR EXCHANGE BILIARY DRAIN  02/17/2018  . IR EXCHANGE BILIARY DRAIN  03/20/2018  . IR EXCHANGE BILIARY DRAIN  04/17/2018  . IR EXCHANGE BILIARY DRAIN  05/29/2018  . IR EXCHANGE BILIARY DRAIN  07/24/2018  . IR EXCHANGE BILIARY DRAIN  09/22/2018  . IR EXCHANGE BILIARY DRAIN  11/20/2018  . IR EXCHANGE BILIARY DRAIN  02/11/2019  . IR EXCHANGE BILIARY DRAIN  04/14/2019  . IR EXCHANGE BILIARY DRAIN  06/09/2019  . IR  EXCHANGE BILIARY DRAIN  11/26/2019  . IR PERC CHOLECYSTOSTOMY  01/27/2017  . IR PERC CHOLECYSTOSTOMY  11/18/2019  . IR RADIOLOGIST EVAL & MGMT  03/05/2017  . MASTECTOMY, PARTIAL Right     Social History   Socioeconomic History  . Marital status: Widowed    Spouse name: Not on file  . Number of children: Not on file  . Years of education: Not on file  . Highest education level: Not on file  Occupational History  . Not on file  Tobacco Use  . Smoking status: Never Smoker  . Smokeless tobacco: Never Used  Substance and Sexual Activity  . Alcohol use: No  . Drug use: No  . Sexual activity: Not on file  Other Topics Concern  . Not on file  Social History Narrative  . Not on file   Social Determinants of Health   Financial Resource Strain:   . Difficulty of Paying Living Expenses: Not on file  Food Insecurity:   . Worried About Charity fundraiser in the Last Year: Not on file  . Ran Out of Food in the Last Year: Not on file  Transportation Needs:   . Lack of Transportation (Medical): Not on file  . Lack of Transportation (Non-Medical): Not on file  Physical Activity:   .  Days of Exercise per Week: Not on file  . Minutes of Exercise per Session: Not on file  Stress:   . Feeling of Stress : Not on file  Social Connections:   . Frequency of Communication with Friends and Family: Not on file  . Frequency of Social Gatherings with Friends and Family: Not on file  . Attends Religious Services: Not on file  . Active Member of Clubs or Organizations: Not on file  . Attends Archivist Meetings: Not on file  . Marital Status: Not on file  Intimate Partner Violence:   . Fear of Current or Ex-Partner: Not on file  . Emotionally Abused: Not on file  . Physically Abused: Not on file  . Sexually Abused: Not on file   Family History  Problem Relation Age of Onset  . Cancer Father        ? primary  . Diabetes Neg Hx   . Heart disease Neg Hx   . Stroke Neg Hx        VITAL SIGNS BP (!) 80/51   Pulse 76   Temp 98.1 F (36.7 C) (Oral)   Resp 20   Ht 5\' 3"  (1.6 m)   Wt 138 lb 3.2 oz (62.7 kg)   SpO2 93%   BMI 24.48 kg/m   Outpatient Encounter Medications as of 11/30/2019  Medication Sig  . calcium carbonate (TUMS) 500 MG chewable tablet Chew 1 tablet by mouth daily.  . Cholecalciferol 1000 units tablet Take 1,000 Units by mouth daily. hypocalcemia  . Cyanocobalamin (VITAMIN B-12) 1000 MCG SUBL Place 1 tablet under the tongue at bedtime.  . donepezil (ARICEPT) 10 MG tablet Take 1 tablet by mouth at bedtime.  . NON FORMULARY NAS dieT  . Nutritional Supplements (ENSURE ENLIVE PO) Take 1.5 kcal by mouth 2 (two) times daily between meals.  . Nutritional Supplements (NUTRITIONAL SUPPLEMENT PO) Take 1 each by mouth at bedtime. Magic Cup  . polyethylene glycol (MIRALAX / GLYCOLAX) packet Take 17 g by mouth daily as needed for mild constipation or moderate constipation.   . Sodium Chloride Flush (NORMAL SALINE FLUSH) 0.9 % SOLN Inject into the vein daily. 5 cc  . sodium chloride irrigation 0.9 % irrigation Irrigate with 10 mLs as directed daily. Special Instructions: Flush catheter  . traMADol (ULTRAM) 50 MG tablet Take 50 mg by mouth every 6 (six) hours as needed.  . [DISCONTINUED] amoxicillin-clavulanate (AUGMENTIN) 875-125 MG tablet Take 1 tablet by mouth 2 (two) times daily. X 7 days  . [DISCONTINUED] metroNIDAZOLE (FLAGYL) 500 MG tablet Take 1 tablet (500 mg total) by mouth 3 (three) times daily. X 7 days  . [DISCONTINUED] traMADol (ULTRAM) 50 MG tablet Take 1 tablet (50 mg total) by mouth every 6 (six) hours as needed for up to 7 days.   No facility-administered encounter medications on file as of 11/30/2019.     SIGNIFICANT DIAGNOSTIC EXAMS   PREVIOUS;   08-26-19: ct of abdomen  1. Interval removal of the previously seen percutaneous cholecystostomy tube. Cholelithiasis with findings concerning for acute on chronic cholecystitis. Clinical  correlation is recommended. Ultrasound may provide better evaluation of the gallbladder. 2. Diffuse colonic diverticulosis. No bowel obstruction. 3. Aortic Atherosclerosis   11-13-19: ct of abdomen and pelvis:  1. Persistently thickened gallbladder wall with numerous gallstones, including one at the level of the gallbladder neck. Surrounding pericholecystic fat stranding without a well-defined fluid collection or evidence of abscess. Findings are concerning for acute on chronic cholecystitis. Consider  nuclear medicine hepatobiliary scan to evaluate patency of the cystic and common bile ducts. 2. Extensive colonic diverticulosis without evidence of diverticulitis.  11-16-19: chest x-ray: Very mild right basilar linear atelectasis.   11-16-19: abdominal ultrasound: Acute cholecystitis.   11-18-19: chole tube placement: Successful ultrasound and fluoroscopic 10 French cholecystostomy   NO NEW EXAMS.    LABS REVIEWED: PREVIOUS:   02-17-19: wbc 6.1; hgb 12.9; hct 41.5; mcv 98.3 plt 269 glucose 92; bun 18; creat 0.91; k+ 4.3; na++ 139; ca 8.8 liver normal albumin 3.5  06-24-19; wbc 6.0; hgb 12.5; hct 40.1; mcv 97.1; plt 255; glucose 96; bun 17; creat 0.87; k+ 4.2; na++ 136; ca 8.6; albumin 3.5; lipase 39 07-09-19: liver normal albumin 3.6 07-21-19: wbc 5.2; hgb 11.9; hct 38.5; mcv 97.0 plt 234; glucose 92; bun 19; creat 0.89; k+ 4.1; na++ 138; ca 8.6 08-26-19: wbc 11.8; hgb 11.3; hct 35.7; mcv 96.5 plt 227; glucose 97; bun 31; creat 1.02; k+ 3.8; na++ 139; ca 8.6; liver normal albumin 2.8   11-16-19: wbc 10.3; hgb 12.1; hct 39.3 mcv 98.5 plt 233; glucose 122; bun 31; creat 1.05; k 3.7 na++ 138; ca 8.5 ast 103 alt 81; albumin 3.0 1-28--21: wbc 12.3; hgb 11.2; hct 35.2; mcv 95.4 plt 265; glucose 88; bun 20; creat 0.95 ;k+ 3.7; na++ 148; ca 8.5 liver normal albumin 2.5 mag 2.3 11-21-19: wbc 8.9; hgb 10.5; hct 33.2; mcv 96.5 plt 265; glucose 95; bun 12; creat 0.86; k+ 3.5; na++ 140; ca 8.1 mag 1.9   NO NEW  LABS.   Review of Systems  Constitutional: Negative for malaise/fatigue.  Respiratory: Negative for cough and shortness of breath.   Cardiovascular: Negative for chest pain, palpitations and leg swelling.  Gastrointestinal: Negative for abdominal pain, constipation and heartburn.  Musculoskeletal: Negative for back pain, joint pain and myalgias.  Skin: Negative.   Neurological: Negative for dizziness.  Psychiatric/Behavioral: The patient is not nervous/anxious.    Physical Exam Constitutional:      General: She is not in acute distress.    Appearance: She is well-developed. She is not diaphoretic.  Neck:     Thyroid: No thyromegaly.  Cardiovascular:     Rate and Rhythm: Normal rate and regular rhythm.     Heart sounds: Normal heart sounds.  Pulmonary:     Effort: Pulmonary effort is normal. No respiratory distress.     Breath sounds: Normal breath sounds.  Chest:     Comments: History of right partial mastectomy Abdominal:     General: Bowel sounds are normal. There is no distension.     Palpations: Abdomen is soft.     Tenderness: There is no abdominal tenderness.     Comments: History of right hemi colectomy  Bili drain in place   Musculoskeletal:     Right lower leg: No edema.     Left lower leg: No edema.     Comments: Is able to move all extremities  History of C2 fracture      Lymphadenopathy:     Cervical: No cervical adenopathy.  Skin:    General: Skin is warm and dry.  Neurological:     Mental Status: She is alert. Mental status is at baseline.  Psychiatric:        Mood and Affect: Mood normal.         ASSESSMENT/ PLAN:  TODAY;   1. Calculus of gall bladder with acute cholecystitis and obstruction: bili tube has been replaced on 11-26-19 will  monitor  her status.   2. Dementia without behavioral disturbance unspecified dementia type: no significant change in status; wight is 138 ppounds will continue aricept 10 mg daily   3. Essential hypertension: is  stable b/p 80/51 will monitor   PREVIOUS  4. Chronic constipation: is stable will continue miralax daily as needed  5. Malignant neoplasm of right female breast unspecified estrogen receptor unspecified site of breaST: history of right partial mastectomy will monitor       MD is aware of resident's narcotic use and is in agreement with current plan of care. We will attempt to wean resident as appropriate.  Ok Edwards NP [facility]  Contact [phone] Monday through Friday 8am- 5pm  After hours call [phone]

## 2019-12-07 ENCOUNTER — Non-Acute Institutional Stay (SKILLED_NURSING_FACILITY): Payer: Medicare Other | Admitting: Adult Health

## 2019-12-07 ENCOUNTER — Encounter: Payer: Self-pay | Admitting: Adult Health

## 2019-12-07 DIAGNOSIS — I10 Essential (primary) hypertension: Secondary | ICD-10-CM

## 2019-12-07 DIAGNOSIS — F0391 Unspecified dementia with behavioral disturbance: Secondary | ICD-10-CM

## 2019-12-07 DIAGNOSIS — K8012 Calculus of gallbladder with acute and chronic cholecystitis without obstruction: Secondary | ICD-10-CM

## 2019-12-07 NOTE — Progress Notes (Signed)
Location:    [place] Room Number: 157/D Place of Service:  SNF (31)   CODE STATUS: Full Code  No Known Allergies  Chief Complaint  Patient presents with  . Medical Management of Chronic Issues       Calculus of gall bladder with acute cholecystitis and obstruction:  Dementia without behavioral disturbance unspecified dementia type:Essential hypertension:  Weekly follow up for the first 30 days post hospitalization.     HPI:  She is a 86 year old long term resident of this facility being seen of rhe management of her chronic illnesses; gall bladder; dementia; hypertension. There are no reports of uncontrolled pain; no changes in her appetite; no reports of anxiety or agitation.   Past Medical History:  Diagnosis Date  . Breast cancer ([place])   . Cognitive communication deficit   . Dementia ([place])   . Difficulty in walking(719.7)   . Fall   . HTN (hypertension) 08/26/2016  . Muscle weakness (generalized)   . Rash and nonspecific skin eruption   . Thyroid disease    hypo  . Urinary tract infection, site not specified   . Vitamin B deficiency     Past Surgical History:  Procedure Laterality Date  . COLON SURGERY    . IR CATHETER TUBE CHANGE  05/07/2017  . IR EXCHANGE BILIARY DRAIN  07/02/2017  . IR EXCHANGE BILIARY DRAIN  07/10/2017  . IR EXCHANGE BILIARY DRAIN  08/28/2017  . IR EXCHANGE BILIARY DRAIN  10/11/2017  . IR EXCHANGE BILIARY DRAIN  10/28/2017  . IR EXCHANGE BILIARY DRAIN  12/23/2017  . IR EXCHANGE BILIARY DRAIN  01/23/2018  . IR EXCHANGE BILIARY DRAIN  02/17/2018  . IR EXCHANGE BILIARY DRAIN  03/20/2018  . IR EXCHANGE BILIARY DRAIN  04/17/2018  . IR EXCHANGE BILIARY DRAIN  05/29/2018  . IR EXCHANGE BILIARY DRAIN  07/24/2018  . IR EXCHANGE BILIARY DRAIN  09/22/2018  . IR EXCHANGE BILIARY DRAIN  11/20/2018  . IR EXCHANGE BILIARY DRAIN  02/11/2019  . IR EXCHANGE BILIARY DRAIN  04/14/2019  . IR EXCHANGE BILIARY DRAIN  06/09/2019  . IR EXCHANGE BILIARY  DRAIN  11/26/2019  . IR PERC CHOLECYSTOSTOMY  01/27/2017  . IR PERC CHOLECYSTOSTOMY  11/18/2019  . IR RADIOLOGIST EVAL & MGMT  03/05/2017  . MASTECTOMY, PARTIAL Right     Social History   Socioeconomic History  . Marital status: Widowed    Spouse name: Not on file  . Number of children: Not on file  . Years of education: Not on file  . Highest education level: Not on file  Occupational History  . Not on file  Tobacco Use  . Smoking status: Never Smoker  . Smokeless tobacco: Never Used  Substance and Sexual Activity  . Alcohol use: No  . Drug use: No  . Sexual activity: Not on file  Other Topics Concern  . Not on file  Social History Narrative  . Not on file   Social Determinants of Health   Financial Resource Strain:   . Difficulty of Paying Living Expenses: Not on file  Food Insecurity:   . Worried About Charity fundraiser in the Last Year: Not on file  . Ran Out of Food in the Last Year: Not on file  Transportation Needs:   . Lack of Transportation (Medical): Not on file  . Lack of Transportation (Non-Medical): Not on file  Physical Activity:   . Days of Exercise per Week: Not on file  .  Minutes of Exercise per Session: Not on file  Stress:   . Feeling of Stress : Not on file  Social Connections:   . Frequency of Communication with Friends and Family: Not on file  . Frequency of Social Gatherings with Friends and Family: Not on file  . Attends Religious Services: Not on file  . Active Member of Clubs or Organizations: Not on file  . Attends Archivist Meetings: Not on file  . Marital Status: Not on file  Intimate Partner Violence:   . Fear of Current or Ex-Partner: Not on file  . Emotionally Abused: Not on file  . Physically Abused: Not on file  . Sexually Abused: Not on file   Family History  Problem Relation Age of Onset  . Cancer Father        ? primary  . Diabetes Neg Hx   . Heart disease Neg Hx   . Stroke Neg Hx       VITAL SIGNS BP  121/70   Pulse 80   Temp (!) 97.2 F (36.2 C) (Oral)   Resp 20   Ht 5\' 3"  (1.6 m)   Wt 138 lb 3.2 oz (62.7 kg)   SpO2 93%   BMI 24.48 kg/m   Outpatient Encounter Medications as of 12/07/2019  Medication Sig  . calcium carbonate (TUMS) 500 MG chewable tablet Chew 1 tablet by mouth daily.  . Cholecalciferol 1000 units tablet Take 1,000 Units by mouth daily. hypocalcemia  . Cyanocobalamin (VITAMIN B-12) 1000 MCG SUBL Place 1 tablet under the tongue at bedtime.  . donepezil (ARICEPT) 10 MG tablet Take 1 tablet by mouth at bedtime.  . NON FORMULARY NAS dieT  . Nutritional Supplements (ENSURE ENLIVE PO) Take 1.5 kcal by mouth 2 (two) times daily between meals.  . Nutritional Supplements (NUTRITIONAL SUPPLEMENT PO) Take 1 each by mouth at bedtime. Magic Cup  . polyethylene glycol (MIRALAX / GLYCOLAX) packet Take 17 g by mouth daily as needed for mild constipation or moderate constipation.   . Sodium Chloride Flush (NORMAL SALINE FLUSH) 0.9 % SOLN Inject into the vein daily. 5 cc  . sodium chloride irrigation 0.9 % irrigation Irrigate with 10 mLs as directed daily. Special Instructions: Flush catheter  . traMADol (ULTRAM) 50 MG tablet Take 50 mg by mouth every 6 (six) hours as needed.   No facility-administered encounter medications on file as of 12/07/2019.     SIGNIFICANT DIAGNOSTIC EXAMS   PREVIOUS;   08-26-19: ct of abdomen  1. Interval removal of the previously seen percutaneous cholecystostomy tube. Cholelithiasis with findings concerning for acute on chronic cholecystitis. Clinical correlation is recommended. Ultrasound may provide better evaluation of the gallbladder. 2. Diffuse colonic diverticulosis. No bowel obstruction. 3. Aortic Atherosclerosis   11-13-19: ct of abdomen and pelvis:  1. Persistently thickened gallbladder wall with numerous gallstones, including one at the level of the gallbladder neck. Surrounding pericholecystic fat stranding without a well-defined fluid  collection or evidence of abscess. Findings are concerning for acute on chronic cholecystitis. Consider nuclear medicine hepatobiliary scan to evaluate patency of the cystic and common bile ducts. 2. Extensive colonic diverticulosis without evidence of diverticulitis.  11-16-19: chest x-ray: Very mild right basilar linear atelectasis.   11-16-19: abdominal ultrasound: Acute cholecystitis.   11-18-19: chole tube placement: Successful ultrasound and fluoroscopic 10 French cholecystostomy   NO NEW EXAMS.    LABS REVIEWED: PREVIOUS:   02-17-19: wbc 6.1; hgb 12.9; hct 41.5; mcv 98.3 plt 269 glucose 92; bun 18;  creat 0.91; k+ 4.3; na++ 139; ca 8.8 liver normal albumin 3.5  06-24-19; wbc 6.0; hgb 12.5; hct 40.1; mcv 97.1; plt 255; glucose 96; bun 17; creat 0.87; k+ 4.2; na++ 136; ca 8.6; albumin 3.5; lipase 39 07-09-19: liver normal albumin 3.6 07-21-19: wbc 5.2; hgb 11.9; hct 38.5; mcv 97.0 plt 234; glucose 92; bun 19; creat 0.89; k+ 4.1; na++ 138; ca 8.6 08-26-19: wbc 11.8; hgb 11.3; hct 35.7; mcv 96.5 plt 227; glucose 97; bun 31; creat 1.02; k+ 3.8; na++ 139; ca 8.6; liver normal albumin 2.8   11-16-19: wbc 10.3; hgb 12.1; hct 39.3 mcv 98.5 plt 233; glucose 122; bun 31; creat 1.05; k 3.7 na++ 138; ca 8.5 ast 103 alt 81; albumin 3.0 1-28--21: wbc 12.3; hgb 11.2; hct 35.2; mcv 95.4 plt 265; glucose 88; bun 20; creat 0.95 ;k+ 3.7; na++ 148; ca 8.5 liver normal albumin 2.5 mag 2.3 11-21-19: wbc 8.9; hgb 10.5; hct 33.2; mcv 96.5 plt 265; glucose 95; bun 12; creat 0.86; k+ 3.5; na++ 140; ca 8.1 mag 1.9   NO NEW LABS.   Review of Systems  Constitutional: Negative for malaise/fatigue.  Respiratory: Negative for cough and shortness of breath.   Cardiovascular: Negative for chest pain, palpitations and leg swelling.  Gastrointestinal: Negative for abdominal pain, constipation and heartburn.  Musculoskeletal: Negative for back pain, joint pain and myalgias.  Skin: Negative.   Neurological: Negative for  dizziness.  Psychiatric/Behavioral: The patient is not nervous/anxious.    Physical Exam Constitutional:      General: She is not in acute distress.    Appearance: She is well-developed. She is not diaphoretic.  Neck:     Thyroid: No thyromegaly.  Cardiovascular:     Rate and Rhythm: Normal rate and regular rhythm.     Pulses: Normal pulses.     Heart sounds: Normal heart sounds.  Pulmonary:     Effort: Pulmonary effort is normal. No respiratory distress.     Breath sounds: Normal breath sounds.  Chest:     Comments: History of right partial mastectomy Abdominal:     General: Bowel sounds are normal. There is no distension.     Palpations: Abdomen is soft.     Tenderness: There is no abdominal tenderness.     Comments: History of right hemi colectomy  Bili drain in place    Musculoskeletal:     Cervical back: Neck supple.     Right lower leg: No edema.     Left lower leg: No edema.     Comments: Is able to move all extremities   Lymphadenopathy:     Cervical: No cervical adenopathy.  Skin:    General: Skin is warm and dry.  Neurological:     Mental Status: She is alert. Mental status is at baseline.  Psychiatric:        Mood and Affect: Mood normal.        ASSESSMENT/ PLAN:  TODAY;   1. Calculus of gall bladder with acute cholecystitis and obstruction: bili tube has been replaced on 11-26-19 will  monitor her status.   2. Dementia without behavioral disturbance unspecified dementia type: no significant change in status; wight is 138 ppounds will continue aricept 10 mg daily   3. Essential hypertension: is stable b/p 821/70 will monitor   PREVIOUS  4. Chronic constipation: is stable will continue miralax daily as needed  5. Malignant neoplasm of right female breast unspecified estrogen receptor unspecified site of breaST: history of right partial mastectomy  will monitor     MD is aware of resident's narcotic use and is in agreement with current plan of care.  We will attempt to wean resident as appropriate.  Ok Edwards NP [facility]  Contact [phone] Monday through Friday 8am- 5pm  After hours call [phone]

## 2019-12-14 ENCOUNTER — Encounter: Payer: Self-pay | Admitting: Internal Medicine

## 2019-12-14 ENCOUNTER — Non-Acute Institutional Stay (SKILLED_NURSING_FACILITY): Payer: Medicare Other | Admitting: Internal Medicine

## 2019-12-14 DIAGNOSIS — K810 Acute cholecystitis: Secondary | ICD-10-CM | POA: Diagnosis not present

## 2019-12-14 DIAGNOSIS — I10 Essential (primary) hypertension: Secondary | ICD-10-CM | POA: Diagnosis not present

## 2019-12-14 DIAGNOSIS — F0391 Unspecified dementia with behavioral disturbance: Secondary | ICD-10-CM | POA: Diagnosis not present

## 2019-12-14 DIAGNOSIS — K8012 Calculus of gallbladder with acute and chronic cholecystitis without obstruction: Secondary | ICD-10-CM

## 2019-12-14 NOTE — Progress Notes (Signed)
Location:  [facility] Room Number: 141D Place of Service:  SNF (31) Provider:  Wille Celeste, PA-C   Hennie Duos, MD  Patient Care Team: Hennie Duos, MD as PCP - General (Internal Medicine) [name], NP as Nurse Practitioner ([place]) [place], [place] ([place])  Extended Emergency Contact Information Primary Emergency Contact: [place] Phone: [phone] Relation: Daughter  Code Status:  FULL Goals of care: Advanced Directive information Advanced Directives 12/14/2019  Does Patient Have a Medical Advance Directive? Yes  Type of Advance Directive (No Data)  Does patient want to make changes to medical advance directive? No - Patient declined  Copy of [place] in Chart? -  Would patient like information on creating a medical advance directive? -     Chief complaint medical management of chronic medical conditions including history of calculus of gallbladder-dementia-hypertension and constipation.  She is in short-term rehab after her recent hospitalization in January for the calculus of gallbladder  This is weekly follow-up for first 30 days following hospitalization  HPI:  Pt is a 86 y.o. female seen today for medical management of chronic diseases.  As noted above.  Patient was hospitalized in late January for calculus of the gallbladder with acute cholecystitis and obstruction she did have her tube replaced in early February and appears to be tolerating this well.  She does have a history of dementia she is on Aricept 10 mg a day her weight is stable to slightly increased.  She also has a history of hypertension which is been stable on no medications recent blood pressures 118/58-121/70.  She has no complaints today she continues to be at her baseline with some cognitive impairment but does allow examination.  She is not complaining  of any abdominal pain nursing does not report any recent issues   Past Medical History:  Diagnosis Date  . Breast cancer ([place])   . Cognitive communication deficit   . Dementia ([place])   . Difficulty in walking(719.7)   . Fall   . HTN (hypertension) 08/26/2016  . Muscle weakness (generalized)   . Rash and nonspecific skin eruption   . Thyroid disease    hypo  . Urinary tract infection, site not specified   . Vitamin B deficiency    Past Surgical History:  Procedure Laterality Date  . COLON SURGERY    . IR CATHETER TUBE CHANGE  05/07/2017  . IR EXCHANGE BILIARY DRAIN  07/02/2017  . IR EXCHANGE BILIARY DRAIN  07/10/2017  . IR EXCHANGE BILIARY DRAIN  08/28/2017  . IR EXCHANGE BILIARY DRAIN  10/11/2017  . IR EXCHANGE BILIARY DRAIN  10/28/2017  . IR EXCHANGE BILIARY DRAIN  12/23/2017  . IR EXCHANGE BILIARY DRAIN  01/23/2018  . IR EXCHANGE BILIARY DRAIN  02/17/2018  . IR EXCHANGE BILIARY DRAIN  03/20/2018  . IR EXCHANGE BILIARY DRAIN  04/17/2018  . IR EXCHANGE BILIARY DRAIN  05/29/2018  . IR EXCHANGE BILIARY DRAIN  07/24/2018  . IR EXCHANGE BILIARY DRAIN  09/22/2018  . IR EXCHANGE BILIARY DRAIN  11/20/2018  . IR EXCHANGE BILIARY DRAIN  02/11/2019  . IR EXCHANGE BILIARY DRAIN  04/14/2019  . IR EXCHANGE BILIARY DRAIN  06/09/2019  . IR EXCHANGE BILIARY DRAIN  11/26/2019  . IR PERC CHOLECYSTOSTOMY  01/27/2017  . IR PERC CHOLECYSTOSTOMY  11/18/2019  . IR RADIOLOGIST EVAL & MGMT  03/05/2017  . MASTECTOMY, PARTIAL Right  No Known Allergies  Outpatient Encounter Medications as of 12/14/2019  Medication Sig  . calcium carbonate (TUMS) 500 MG chewable tablet Chew 1 tablet by mouth daily.  . Cholecalciferol 1000 units tablet Take 1,000 Units by mouth daily. hypocalcemia  . Cyanocobalamin (VITAMIN B-12) 1000 MCG SUBL Place 1 tablet under the tongue at bedtime.  . donepezil (ARICEPT) 10 MG tablet Take 1 tablet by mouth at bedtime.  . NON FORMULARY NAS dieT  . Nutritional Supplements (ENSURE ENLIVE PO) Take  1.5 kcal by mouth 2 (two) times daily between meals.  . Nutritional Supplements (NUTRITIONAL SUPPLEMENT PO) Take 1 each by mouth at bedtime. Magic Cup  . polyethylene glycol (MIRALAX / GLYCOLAX) packet Take 17 g by mouth daily as needed for mild constipation or moderate constipation.   . Sodium Chloride Flush (NORMAL SALINE FLUSH) 0.9 % SOLN Inject into the vein daily. 5 cc  . sodium chloride irrigation 0.9 % irrigation Irrigate with 10 mLs as directed daily. Special Instructions: Flush catheter  . traMADol (ULTRAM) 50 MG tablet Take 50 mg by mouth every 6 (six) hours as needed.   No facility-administered encounter medications on file as of 12/14/2019.    Review of Systems   This is limited secondary to dementia she does not have any complaints today.  Nursing does not report any issues-she is not complaining of any abdominal discomfort nausea or vomiting  Immunization History  Administered Date(s) Administered  . Influenza-Unspecified 07/28/2014, 07/26/2017, 07/24/2018, 07/27/2019  . Moderna SARS-COVID-2 Vaccination 10/28/2019  . Pneumococcal Conjugate-13 06/21/2017  . Pneumococcal-Unspecified 08/01/2016  . Tdap 07/11/2017   Pertinent  Health Maintenance Due  Topic Date Due  . INFLUENZA VACCINE  Completed  . DEXA SCAN  Completed  . PNA vac Low Risk Adult  Completed   Fall Risk  09/29/2019 07/21/2018 07/11/2017  Falls in the past year? 0 No No  Number falls in past yr: 0 - -  Injury with Fall? 0 - -   Functional Status Survey:    Vitals:   12/14/19 0933  BP: (!) 118/58  Pulse: 66  Resp: 20  Temp: 97.9 F (36.6 C)  TempSrc: Oral  SpO2: 93%  Weight: 138 lb 3.2 oz (62.7 kg)  Height: 5\' 3"  (1.6 m)   Body mass index is 24.48 kg/m. Physical Exam In general this is a well-nourished elderly female no distress sitting as usual in her wheelchair she is alert.  Her skin is warm and dry.  Eyes visual acuity appears to be intact sclera and conjunctive are clear.  Oropharynx  clear mucous membranes moist.  Chest is clear to auscultation there is no labored breathing.  Heart is regular rate and rhythm without murmur gallop or rub she has mild lower extremity edema appears baseline.  Abdomen is soft has some mild tenderness to palpation this appears more related to invasive maneuver that true tenderness.  She does have a drain in place which appears to be draining adequately the bag is on her right leg.  Musculoskeletal appears able to move all extremities x4 at baseline.  Neurologic is grossly intact her speech is clear she is alert.  Psych she is oriented to self she is pleasant appropriate findings consistent with her baseline dementia.   Labs reviewed: Recent Labs    11/19/19 0606 11/20/19 0516 11/21/19 0533  NA 148* 145 140  K 3.7 3.3* 3.5  CL 114* 109 106  CO2 26 27 26   GLUCOSE 88 99 95  BUN 20 16 12  CREATININE 0.95 0.96 0.86  CALCIUM 8.5* 8.3* 8.1*  MG 2.3  --  1.9   Recent Labs    11/17/19 0634 11/19/19 0606 11/20/19 0516  AST 66* 17 14*  ALT 75* 38 25  ALKPHOS 76 72 59  BILITOT 0.7 1.0 0.7  PROT 6.4* 6.4* 5.8*  ALBUMIN 2.5* 2.5* 2.3*   Recent Labs    08/26/19 1215 08/26/19 1215 10/29/19 1430 10/29/19 1430 11/16/19 1620 11/17/19 0634 11/19/19 0606 11/20/19 0516 11/21/19 0533  WBC 11.8*   < > 5.6   < > 10.3   < > 12.3* 11.4* 8.9  NEUTROABS 10.1*  --  3.9  --  8.5*  --   --   --   --   HGB 11.3*   < > 12.1   < > 12.1   < > 11.2* 10.0* 10.5*  HCT 35.7*   < > 38.4   < > 39.3   < > 35.2* 31.4* 33.2*  MCV 96.5   < > 98.2   < > 98.5   < > 95.4 94.9 96.5  PLT 227   < > 213   < > 233   < > 265 262 265   < > = values in this interval not displayed.   Lab Results  Component Value Date   TSH 3.877 01/02/2018   No results found for: HGBA1C No results found for: CHOL, HDL, LDLCALC, LDLDIRECT, TRIG, CHOLHDL  Significant Diagnostic Results in last 30 days:  DG Wrist 2 Views Right  Result Date: 11/19/2019 CLINICAL DATA:   Fall with wrist pain EXAM: RIGHT WRIST - 2 VIEW COMPARISON:  None. FINDINGS: There is no evidence of fracture or dislocation. Mild degenerative changes are seen in involving the carpal bones. Soft tissues are unremarkable. IMPRESSION: No acute abnormality. Electronically Signed   By: Zerita Boers M.D.   On: 11/19/2019 21:35   DG Hand 2 View Right  Result Date: 11/19/2019 CLINICAL DATA:  Fall with hand pain. EXAM: RIGHT HAND - 2 VIEW COMPARISON:  None. FINDINGS: There is no evidence of fracture or dislocation. Mild degenerative changes involving the carpal bones and distal interphalangeal joints. Soft tissues are unremarkable. IMPRESSION: No acute osseous injury. Electronically Signed   By: Zerita Boers M.D.   On: 11/19/2019 21:36   US Abdomen Limited  Result Date: 11/16/2019 CLINICAL DATA:  History of cholelithiasis with right upper quadrant pain. EXAM: ULTRASOUND ABDOMEN LIMITED RIGHT UPPER QUADRANT COMPARISON:  CT abdomen pelvis 11/13/2019. FINDINGS: Gallbladder: There are shadowing echogenic stones and sludge in the gallbladder. Gallbladder wall is thickened, 8 mm. Pericholecystic fluid is present. Positive sonographic Murphy sign. Common bile duct: Diameter: Poorly visualized due to patient condition and inability to breath hold. Liver: No focal lesion identified. Within normal limits in parenchymal echogenicity. Portal vein is patent on color Doppler imaging with normal direction of blood flow towards the liver. Other: None. IMPRESSION: Acute cholecystitis. Electronically Signed   By: Lorin Picket M.D.   On: 11/16/2019 16:04   IR Perc Cholecystostomy  Result Date: 11/18/2019 INDICATION: Recurrent cholecystitis EXAM: Ultrasound fluoroscopic percutaneous cholecystostomy MEDICATIONS: Patient is already receiving IV antibiotics as an inpatient ANESTHESIA/SEDATION: Moderate (conscious) sedation was employed during this procedure. A total of Versed 1.5 mg and Fentanyl 75 mcg was administered  intravenously. Moderate Sedation Time: 17 minutes. The patient's level of consciousness and vital signs were monitored continuously by radiology nursing throughout the procedure under my direct supervision. FLUOROSCOPY TIME:  Fluoroscopy Time: 0 minutes 6 seconds (  1 mGy). COMPLICATIONS: None immediate. PROCEDURE: Informed written consent was obtained from the patient's family after a thorough discussion of the procedural risks, benefits and alternatives. All questions were addressed. Maximal Sterile Barrier Technique was utilized including caps, mask, sterile gowns, sterile gloves, sterile drape, hand hygiene and skin antiseptic. A timeout was performed prior to the initiation of the procedure. Under sterile conditions and local anesthesia, ultrasound percutaneous needle access performed of the gallbladder. Needle position confirmed with ultrasound. Images obtained for documentation. There was return of purulent bile. Guidewire inserted followed by tract dilatation to insert a 10 [place] drain. Drain catheter position confirmed with ultrasound and fluoroscopy. Syringe aspiration yielded 70 cc purulent bile. Sample sent for culture. Catheter secured with a Prolene suture and connected to external gravity drainage bag. Sterile dressing applied. No immediate complication. Patient tolerated the procedure well. IMPRESSION: Successful ultrasound and fluoroscopic 10 French cholecystostomy Electronically Signed   By: Jerilynn Mages.  Shick M.D.   On: 11/18/2019 14:35   DG Chest Port 1 View  Result Date: 11/16/2019 CLINICAL DATA:  Pre-admission chest x-ray 4 cholecystitis. EXAM: PORTABLE CHEST 1 VIEW COMPARISON:  October 09, 2017 FINDINGS: Very mild linear atelectasis is seen within the right lung base. There is no evidence of a pleural effusion or pneumothorax. The heart size and mediastinal contours are within normal limits. There is moderate severity calcification of the aortic arch. Multilevel degenerative changes are seen  throughout the thoracic spine. IMPRESSION: 1. Very mild right basilar linear atelectasis. Electronically Signed   By: Virgina Norfolk M.D.   On: 11/16/2019 17:17    Assessment/Plan   #1 history of calculus of gallbladder with acute cholecystitis and obstruction-she does have a tube in place this appears to be draining adequately she does not really complain of true abdominal pain or nausea or vomiting at this point will monitor appears to be stable.  2.  History of dementia she appears to be at baseline she actually has gained a small amount of weight she is on Aricept 10 mg a day-she appears to do well with supportive care.  3.  Hypertension as noted above this appears to be stable she is not on any medication.  4.  History of constipation she does have MiraLAX if needed apparently this has not really been an issue recently nursing does not report constipation issues--she does not report any either-- again she is somewhat of a  poor historian secondary to dementia  [phone]

## 2019-12-15 ENCOUNTER — Other Ambulatory Visit (HOSPITAL_COMMUNITY)
Admission: RE | Admit: 2019-12-15 | Discharge: 2019-12-15 | Disposition: A | Discharge status: Home / Self Care | Payer: Medicare Other | Source: Skilled Nursing Facility | Attending: Internal Medicine | Admitting: Internal Medicine

## 2019-12-15 ENCOUNTER — Encounter: Payer: Self-pay | Admitting: Internal Medicine

## 2019-12-15 DIAGNOSIS — I10 Essential (primary) hypertension: Secondary | ICD-10-CM | POA: Insufficient documentation

## 2019-12-15 LAB — BASIC METABOLIC PANEL
Anion gap: 6 (ref 5–15)
BUN: 14 mg/dL (ref 8–23)
CO2: 27 mmol/L (ref 22–32)
Calcium: 8.6 mg/dL — ABNORMAL LOW (ref 8.9–10.3)
Chloride: 106 mmol/L (ref 98–111)
Creatinine, Ser: 0.84 mg/dL (ref 0.44–1.00)
GFR calc Af Amer: >60 mL/min (ref >60)
GFR calc non Af Amer: >60 mL/min (ref >60)
Glucose, Bld: 88 mg/dL (ref 70–99)
Potassium: 4.2 mmol/L (ref 3.5–5.1)
Sodium: 139 mmol/L (ref 135–145)

## 2019-12-21 ENCOUNTER — Other Ambulatory Visit: Payer: Self-pay

## 2019-12-21 ENCOUNTER — Encounter (HOSPITAL_COMMUNITY): Payer: Self-pay | Admitting: *Deleted

## 2019-12-21 ENCOUNTER — Emergency Department (HOSPITAL_COMMUNITY)
Admission: EM | Admit: 2019-12-21 | Discharge: 2019-12-21 | Disposition: A | Discharge status: Home / Self Care | Payer: Medicare Other | Attending: Emergency Medicine | Admitting: Emergency Medicine

## 2019-12-21 DIAGNOSIS — E039 Hypothyroidism, unspecified: Secondary | ICD-10-CM | POA: Insufficient documentation

## 2019-12-21 DIAGNOSIS — Z79899 Other long term (current) drug therapy: Secondary | ICD-10-CM | POA: Insufficient documentation

## 2019-12-21 DIAGNOSIS — Z8616 Personal history of COVID-19: Secondary | ICD-10-CM | POA: Diagnosis not present

## 2019-12-21 DIAGNOSIS — F0390 Unspecified dementia without behavioral disturbance: Secondary | ICD-10-CM | POA: Diagnosis not present

## 2019-12-21 DIAGNOSIS — I10 Essential (primary) hypertension: Secondary | ICD-10-CM | POA: Diagnosis not present

## 2019-12-21 DIAGNOSIS — T85518A Breakdown (mechanical) of other gastrointestinal prosthetic devices, implants and grafts, initial encounter: Secondary | ICD-10-CM

## 2019-12-21 DIAGNOSIS — Y732 Prosthetic and other implants, materials and accessory gastroenterology and urology devices associated with adverse incidents: Secondary | ICD-10-CM | POA: Insufficient documentation

## 2019-12-21 DIAGNOSIS — K9423 Gastrostomy malfunction: Secondary | ICD-10-CM | POA: Diagnosis not present

## 2019-12-21 NOTE — ED Provider Notes (Signed)
[facility] EMERGENCY DEPARTMENT Provider Note   CSN: ID:[id number] Arrival date & time: 12/21/19  2211     History Chief Complaint  Patient presents with  . Wound Check    Cynthia [name] is a 86 y.o. female.  HPI   This patient is a 86 year old female, she has a history of significant cognitive deficit and dementia, she is currently living at the [facility] in the nursing facility and has a history of acute on chronic cholecystitis for which she has had a cholecystostomy tube placed in the past, this has been a problem on and off for quite some time and the general surgeon, Dr. Arnoldo Morale who knows her well was able to give me advice about her this evening.  Evidently the patient had a dislodged drain at the nursing home.  There was no complaints of pain or anything else but some small amount of green drainage coming from the ostomy site.  The patient does not know why she is here, there is no complaints of fevers or vomiting or even abdominal pain.  Level 5 caveat applies secondary to altered mental status  Past Medical History:  Diagnosis Date  . Breast cancer ([place])   . Cognitive communication deficit   . Dementia ([place])   . Difficulty in walking(719.7)   . Fall   . HTN (hypertension) 08/26/2016  . Muscle weakness (generalized)   . Rash and nonspecific skin eruption   . Thyroid disease    hypo  . Urinary tract infection, site not specified   . Vitamin B deficiency     Patient Active Problem List   Diagnosis Date Noted  . Vitamin B12 deficiency 11/28/2019  . Vitamin D deficiency 11/28/2019  . Acute cholecystitis 11/16/2019  . COVID-19 virus infection 11/02/2019  . Abscess of skin of abdomen 08/29/2019  . Chronic constipation 12/20/2018  . S/P Cholecystostomy 01/31/2017  . Calculus of gallbladder with acute on chronic cholecystitis without obstruction 01/26/2017  . HTN (hypertension) 08/26/2016  . Breast cancer ([place]) 12/11/2013  . Benign neoplasm of colon 12/11/2013  .  Dementia ([place]) 12/11/2013  . C2 cervical fracture ([place]) 12/08/2013    Past Surgical History:  Procedure Laterality Date  . COLON SURGERY    . IR CATHETER TUBE CHANGE  05/07/2017  . IR EXCHANGE BILIARY DRAIN  07/02/2017  . IR EXCHANGE BILIARY DRAIN  07/10/2017  . IR EXCHANGE BILIARY DRAIN  08/28/2017  . IR EXCHANGE BILIARY DRAIN  10/11/2017  . IR EXCHANGE BILIARY DRAIN  10/28/2017  . IR EXCHANGE BILIARY DRAIN  12/23/2017  . IR EXCHANGE BILIARY DRAIN  01/23/2018  . IR EXCHANGE BILIARY DRAIN  02/17/2018  . IR EXCHANGE BILIARY DRAIN  03/20/2018  . IR EXCHANGE BILIARY DRAIN  04/17/2018  . IR EXCHANGE BILIARY DRAIN  05/29/2018  . IR EXCHANGE BILIARY DRAIN  07/24/2018  . IR EXCHANGE BILIARY DRAIN  09/22/2018  . IR EXCHANGE BILIARY DRAIN  11/20/2018  . IR EXCHANGE BILIARY DRAIN  02/11/2019  . IR EXCHANGE BILIARY DRAIN  04/14/2019  . IR EXCHANGE BILIARY DRAIN  06/09/2019  . IR EXCHANGE BILIARY DRAIN  11/26/2019  . IR PERC CHOLECYSTOSTOMY  01/27/2017  . IR PERC CHOLECYSTOSTOMY  11/18/2019  . IR RADIOLOGIST EVAL & MGMT  03/05/2017  . MASTECTOMY, PARTIAL Right      OB History   No obstetric history on file.     Family History  Problem Relation Age of Onset  . Cancer Father        ?  primary  . Diabetes Neg Hx   . Heart disease Neg Hx   . Stroke Neg Hx     Social History   Tobacco Use  . Smoking status: Never Smoker  . Smokeless tobacco: Never Used  Substance Use Topics  . Alcohol use: No  . Drug use: No    Home Medications Prior to Admission medications   Medication Sig Start Date End Date Taking? Authorizing Provider  calcium carbonate (TUMS) 500 MG chewable tablet Chew 1 tablet by mouth daily.    [provider]  Cholecalciferol 1000 units tablet Take 1,000 Units by mouth daily. hypocalcemia    [provider]  Cyanocobalamin (VITAMIN B-12) 1000 MCG SUBL Place 1 tablet under the tongue at bedtime.    [provider]  donepezil (ARICEPT) 10 MG tablet Take 1 tablet by  mouth at bedtime. 01/24/17   [provider]  NON FORMULARY NAS dieT    [provider]  Nutritional Supplements (ENSURE ENLIVE PO) Take 1.5 kcal by mouth 2 (two) times daily between meals.    [provider]  Nutritional Supplements (NUTRITIONAL SUPPLEMENT PO) Take 1 each by mouth at bedtime. Magic Cup    [provider]  polyethylene glycol (MIRALAX / GLYCOLAX) packet Take 17 g by mouth daily as needed for mild constipation or moderate constipation.     [provider]  Sodium Chloride Flush (NORMAL SALINE FLUSH) 0.9 % SOLN Inject into the vein daily. 5 cc    [provider]  sodium chloride irrigation 0.9 % irrigation Irrigate with 10 mLs as directed daily. Special Instructions: Flush catheter    [provider]  traMADol (ULTRAM) 50 MG tablet Take 50 mg by mouth every 6 (six) hours as needed.    [provider]    Allergies    Patient has no known allergies.  Review of Systems   Review of Systems  Unable to perform ROS: Mental status change    Physical Exam Updated Vital Signs BP (!) 144/65 (BP Location: Right Arm)   Pulse 66   Temp 98.6 F (37 C) (Oral)   Resp 19   SpO2 97%   Physical Exam Vitals and nursing note reviewed.  Constitutional:      General: She is not in acute distress.    Appearance: She is well-developed.  HENT:     Head: Normocephalic and atraumatic.     Mouth/Throat:     Pharynx: No oropharyngeal exudate.  Eyes:     General: No scleral icterus.       Right eye: No discharge.        Left eye: No discharge.     Conjunctiva/sclera: Conjunctivae normal.     Pupils: Pupils are equal, round, and reactive to light.  Neck:     Thyroid: No thyromegaly.     Vascular: No JVD.  Cardiovascular:     Rate and Rhythm: Normal rate and regular rhythm.     Heart sounds: Normal heart sounds. No murmur. No friction rub. No gallop.   Pulmonary:     Effort: Pulmonary effort is normal. No respiratory  distress.     Breath sounds: Normal breath sounds. No wheezing or rales.  Abdominal:     General: Bowel sounds are normal. There is no distension.     Palpations: Abdomen is soft. There is no mass.     Tenderness: There is no abdominal tenderness.     Comments: Ostomy site is patent, there is no active  drainage, there is absolutely no abdominal tenderness to palpation anywhere  Musculoskeletal:        General: No tenderness. Normal range of motion.     Cervical back: Normal range of motion and neck supple.  Lymphadenopathy:     Cervical: No cervical adenopathy.  Skin:    General: Skin is warm and dry.     Findings: No erythema or rash.  Neurological:     Mental Status: She is alert.     Coordination: Coordination normal.  Psychiatric:        Behavior: Behavior normal.     ED Results / Procedures / Treatments   Labs (all labs ordered are listed, but only abnormal results are displayed) Labs Reviewed - No data to display  EKG None  Radiology No results found.  Procedures Procedures (including critical care time)  Medications Ordered in ED Medications - No data to display  ED Course  I have reviewed the triage vital signs and the nursing notes.  Pertinent labs & imaging results that were available during my care of the patient were reviewed by me and considered in my medical decision making (see chart for details).    MDM Rules/Calculators/A&P                      This patient appears pleasantly demented with normal vital signs and a benign abdomen, I discussed the care with Dr. Arnoldo Morale who recommends that the patient be discharged back to the nursing facility allowing the ostomy to drain until the site closes up.  She may need replacement of this catheter in the future but nothing urgent and she does not need to be admitted to the hospital.  Final Clinical Impression(s) / ED Diagnoses Final diagnoses:  Cholecystostomy tube dysfunction, initial encounter    Rx /  DC Orders ED Discharge Orders    None       Noemi Chapel, MD 12/21/19 2238

## 2019-12-21 NOTE — Discharge Instructions (Addendum)
You will need to follow-up with your family doctor, you will only need to have this drain replaced if you are developing increasing pain, jaundice, fever or vomiting.  Please expect this to continue to drain until the site closes up.  You should keep a dressing on this changed multiple times per day as long as it is filling up with any drainage you will need to be changed for clean dressing.  I spoke with the general surgeon.  She can follow-up with Dr. Arnoldo Morale as needed, but according to his instructions she does not need to come to the office unless things are getting worse  You should come back to the emergency department for severe or worsening symptoms

## 2019-12-21 NOTE — ED Notes (Signed)
Called PCN again to get pt.

## 2019-12-21 NOTE — ED Triage Notes (Signed)
Pt brought in by [facility] for her JP drain that fell out tonight; greenish-yellow drainage noted on dressing; pt has no complaints of pain

## 2019-12-22 ENCOUNTER — Encounter: Payer: Self-pay | Admitting: Adult Health

## 2019-12-22 ENCOUNTER — Other Ambulatory Visit (HOSPITAL_COMMUNITY): Payer: Self-pay | Admitting: Interventional Radiology

## 2019-12-22 ENCOUNTER — Non-Acute Institutional Stay (SKILLED_NURSING_FACILITY): Payer: Medicare Other | Admitting: Adult Health

## 2019-12-22 ENCOUNTER — Inpatient Hospital Stay
Admission: RE | Admit: 2019-12-22 | Discharge: 2021-12-13 | Disposition: A | Discharge status: Skilled Nursing Facility | Payer: Medicare Other | Source: Ambulatory Visit | Attending: Internal Medicine | Admitting: Internal Medicine

## 2019-12-22 ENCOUNTER — Telehealth (HOSPITAL_COMMUNITY): Payer: Self-pay

## 2019-12-22 DIAGNOSIS — K8012 Calculus of gallbladder with acute and chronic cholecystitis without obstruction: Secondary | ICD-10-CM

## 2019-12-22 DIAGNOSIS — F0391 Unspecified dementia with behavioral disturbance: Secondary | ICD-10-CM | POA: Diagnosis not present

## 2019-12-22 DIAGNOSIS — Z434 Encounter for attention to other artificial openings of digestive tract: Secondary | ICD-10-CM

## 2019-12-22 DIAGNOSIS — K819 Cholecystitis, unspecified: Secondary | ICD-10-CM

## 2019-12-22 DIAGNOSIS — I10 Essential (primary) hypertension: Secondary | ICD-10-CM

## 2019-12-22 NOTE — Progress Notes (Signed)
Location:    [facility] Room Number: 141/D Place of Service:  SNF (31)   CODE STATUS: Full Code  No Known Allergies  Chief Complaint  Patient presents with  . Medical Management of Chronic Issues       Calculus of gall bladder with acute cholecystitis and obstruction: Dementia without behavioral disturbance unspecified dementia type:    Essential hypertension:    HPI:  She is a 86 year old long term resident of this facility being seen for the mangement of her chronic illnesses: gall bladder disease; dementia; hypertension. Her bili drain is out; will need to be replaced. There are no reports of uncontrolled pain; no nausea or vomiting. No reports of changes in appetite.   Past Medical History:  Diagnosis Date  . Breast cancer ([place])   . Cognitive communication deficit   . Dementia ([place])   . Difficulty in walking(719.7)   . Fall   . HTN (hypertension) 08/26/2016  . Muscle weakness (generalized)   . Rash and nonspecific skin eruption   . Thyroid disease    hypo  . Urinary tract infection, site not specified   . Vitamin B deficiency     Past Surgical History:  Procedure Laterality Date  . COLON SURGERY    . IR CATHETER TUBE CHANGE  05/07/2017  . IR EXCHANGE BILIARY DRAIN  07/02/2017  . IR EXCHANGE BILIARY DRAIN  07/10/2017  . IR EXCHANGE BILIARY DRAIN  08/28/2017  . IR EXCHANGE BILIARY DRAIN  10/11/2017  . IR EXCHANGE BILIARY DRAIN  10/28/2017  . IR EXCHANGE BILIARY DRAIN  12/23/2017  . IR EXCHANGE BILIARY DRAIN  01/23/2018  . IR EXCHANGE BILIARY DRAIN  02/17/2018  . IR EXCHANGE BILIARY DRAIN  03/20/2018  . IR EXCHANGE BILIARY DRAIN  04/17/2018  . IR EXCHANGE BILIARY DRAIN  05/29/2018  . IR EXCHANGE BILIARY DRAIN  07/24/2018  . IR EXCHANGE BILIARY DRAIN  09/22/2018  . IR EXCHANGE BILIARY DRAIN  11/20/2018  . IR EXCHANGE BILIARY DRAIN  02/11/2019  . IR EXCHANGE BILIARY DRAIN  04/14/2019  . IR EXCHANGE BILIARY DRAIN  06/09/2019  . IR EXCHANGE BILIARY DRAIN   11/26/2019  . IR PERC CHOLECYSTOSTOMY  01/27/2017  . IR PERC CHOLECYSTOSTOMY  11/18/2019  . IR RADIOLOGIST EVAL & MGMT  03/05/2017  . MASTECTOMY, PARTIAL Right     Social History   Socioeconomic History  . Marital status: Widowed    Spouse name: Not on file  . Number of children: Not on file  . Years of education: Not on file  . Highest education level: Not on file  Occupational History  . Not on file  Tobacco Use  . Smoking status: Never Smoker  . Smokeless tobacco: Never Used  Substance and Sexual Activity  . Alcohol use: No  . Drug use: No  . Sexual activity: Not on file  Other Topics Concern  . Not on file  Social History Narrative  . Not on file   Social Determinants of Health   Financial Resource Strain:   . Difficulty of Paying Living Expenses: Not on file  Food Insecurity:   . Worried About Charity fundraiser in the Last Year: Not on file  . Ran Out of Food in the Last Year: Not on file  Transportation Needs:   . Lack of Transportation (Medical): Not on file  . Lack of Transportation (Non-Medical): Not on file  Physical Activity:   . Days of Exercise per Week: Not on  file  . Minutes of Exercise per Session: Not on file  Stress:   . Feeling of Stress : Not on file  Social Connections:   . Frequency of Communication with Friends and Family: Not on file  . Frequency of Social Gatherings with Friends and Family: Not on file  . Attends Religious Services: Not on file  . Active Member of Clubs or Organizations: Not on file  . Attends Archivist Meetings: Not on file  . Marital Status: Not on file  Intimate Partner Violence:   . Fear of Current or Ex-Partner: Not on file  . Emotionally Abused: Not on file  . Physically Abused: Not on file  . Sexually Abused: Not on file   Family History  Problem Relation Age of Onset  . Cancer Father        ? primary  . Diabetes Neg Hx   . Heart disease Neg Hx   . Stroke Neg Hx       VITAL SIGNS BP (!) 118/58    Pulse 66   Temp 97.9 F (36.6 C) (Oral)   Resp 20   Ht 5\' 3"  (1.6 m)   Wt 138 lb 3.2 oz (62.7 kg)   SpO2 93%   BMI 24.48 kg/m   Outpatient Encounter Medications as of 12/22/2019  Medication Sig  . calcium carbonate (TUMS) 500 MG chewable tablet Chew 1 tablet by mouth daily.  . Cholecalciferol 1000 units tablet Take 1,000 Units by mouth daily. hypocalcemia  . Cyanocobalamin (VITAMIN B-12) 1000 MCG SUBL Place 1 tablet under the tongue at bedtime.  . donepezil (ARICEPT) 10 MG tablet Take 1 tablet by mouth at bedtime.  . NON FORMULARY NAS dieT  . Nutritional Supplements (ENSURE ENLIVE PO) Take 1.5 kcal by mouth 2 (two) times daily between meals.  . Nutritional Supplements (NUTRITIONAL SUPPLEMENT PO) Take 1 each by mouth at bedtime. Magic Cup  . polyethylene glycol (MIRALAX / GLYCOLAX) packet Take 17 g by mouth daily as needed for mild constipation or moderate constipation.   . sodium chloride irrigation 0.9 % irrigation Irrigate with 10 mLs as directed daily. Special Instructions: Flush catheter  . traMADol (ULTRAM) 50 MG tablet Take 50 mg by mouth every 6 (six) hours as needed.  . [DISCONTINUED] Sodium Chloride Flush (NORMAL SALINE FLUSH) 0.9 % SOLN Instructions: Flush catheter with 83ml Once A Day   No facility-administered encounter medications on file as of 12/22/2019.     SIGNIFICANT DIAGNOSTIC EXAMS   PREVIOUS;   08-26-19: ct of abdomen  1. Interval removal of the previously seen percutaneous cholecystostomy tube. Cholelithiasis with findings concerning for acute on chronic cholecystitis. Clinical correlation is recommended. Ultrasound may provide better evaluation of the gallbladder. 2. Diffuse colonic diverticulosis. No bowel obstruction. 3. Aortic Atherosclerosis   11-13-19: ct of abdomen and pelvis:  1. Persistently thickened gallbladder wall with numerous gallstones, including one at the level of the gallbladder neck. Surrounding pericholecystic fat stranding without a  well-defined fluid collection or evidence of abscess. Findings are concerning for acute on chronic cholecystitis. Consider nuclear medicine hepatobiliary scan to evaluate patency of the cystic and common bile ducts. 2. Extensive colonic diverticulosis without evidence of diverticulitis.  11-16-19: chest x-ray: Very mild right basilar linear atelectasis.   11-16-19: abdominal ultrasound: Acute cholecystitis.   11-18-19: chole tube placement: Successful ultrasound and fluoroscopic 10 French cholecystostomy   NO NEW EXAMS.    LABS REVIEWED: PREVIOUS:   02-17-19: wbc 6.1; hgb 12.9; hct 41.5; mcv 98.3 plt  269 glucose 92; bun 18; creat 0.91; k+ 4.3; na++ 139; ca 8.8 liver normal albumin 3.5  06-24-19; wbc 6.0; hgb 12.5; hct 40.1; mcv 97.1; plt 255; glucose 96; bun 17; creat 0.87; k+ 4.2; na++ 136; ca 8.6; albumin 3.5; lipase 39 07-09-19: liver normal albumin 3.6 07-21-19: wbc 5.2; hgb 11.9; hct 38.5; mcv 97.0 plt 234; glucose 92; bun 19; creat 0.89; k+ 4.1; na++ 138; ca 8.6 08-26-19: wbc 11.8; hgb 11.3; hct 35.7; mcv 96.5 plt 227; glucose 97; bun 31; creat 1.02; k+ 3.8; na++ 139; ca 8.6; liver normal albumin 2.8   11-16-19: wbc 10.3; hgb 12.1; hct 39.3 mcv 98.5 plt 233; glucose 122; bun 31; creat 1.05; k 3.7 na++ 138; ca 8.5 ast 103 alt 81; albumin 3.0 1-28--21: wbc 12.3; hgb 11.2; hct 35.2; mcv 95.4 plt 265; glucose 88; bun 20; creat 0.95 ;k+ 3.7; na++ 148; ca 8.5 liver normal albumin 2.5 mag 2.3 11-21-19: wbc 8.9; hgb 10.5; hct 33.2; mcv 96.5 plt 265; glucose 95; bun 12; creat 0.86; k+ 3.5; na++ 140; ca 8.1 mag 1.9   NO NEW LABS.   Review of Systems  Constitutional: Negative for malaise/fatigue.  Respiratory: Negative for cough and shortness of breath.   Cardiovascular: Negative for chest pain, palpitations and leg swelling.  Gastrointestinal: Negative for abdominal pain, constipation and heartburn.  Musculoskeletal: Negative for back pain, joint pain and myalgias.  Skin: Negative.   Neurological:  Negative for dizziness.  Psychiatric/Behavioral: The patient is not nervous/anxious.      Physical Exam Constitutional:      General: She is not in acute distress.    Appearance: She is well-developed. She is not diaphoretic.  Neck:     Thyroid: No thyromegaly.  Cardiovascular:     Rate and Rhythm: Normal rate and regular rhythm.     Pulses: Normal pulses.     Heart sounds: Normal heart sounds.  Pulmonary:     Effort: Pulmonary effort is normal. No respiratory distress.     Breath sounds: Normal breath sounds.  Chest:     Comments: History of right partial mastectomy Abdominal:     General: Bowel sounds are normal. There is no distension.     Palpations: Abdomen is soft.     Tenderness: There is no abdominal tenderness.     Comments: History of right hemi colectomy  Bili drain site with dressing intact   Musculoskeletal:        General: Normal range of motion.     Cervical back: Neck supple.     Right lower leg: No edema.     Left lower leg: No edema.  Lymphadenopathy:     Cervical: No cervical adenopathy.  Skin:    General: Skin is warm and dry.  Neurological:     Mental Status: She is alert. Mental status is at baseline.  Psychiatric:        Mood and Affect: Mood normal.     ASSESSMENT/ PLAN:  TODAY;   1. Calculus of gall bladder with acute cholecystitis and obstruction: bili tube is presently out; will setup for reinsertion; will monitor her status.   2. Dementia without behavioral disturbance unspecified dementia type: no significant change in status; wight is 138 ppounds will continue aricept 10 mg daily   3. Essential hypertension: is stable b/p 118/58 will monitor   PREVIOUS  4. Chronic constipation: is stable will continue miralax daily as needed  5. Malignant neoplasm of right female breast unspecified estrogen receptor unspecified site of  breaST: history of right partial mastectomy will monitor            MD is aware of resident's narcotic  use and is in agreement with current plan of care. We will attempt to wean resident as appropriate.  Ok Edwards NP [facility]  Contact [phone] Monday through Friday 8am- 5pm  After hours call [phone]

## 2019-12-22 NOTE — Telephone Encounter (Signed)
-----   Message from Corrie Mckusick, DO sent at 12/22/2019 12:17 PM EST ----- Regarding: RE: Drain replacement We will need to try and replace this tube (not a "new tube") ASAP at [facility] or [facility].  Today or tomorrow.   Earleen Newport ----- Message ----- From: Danielle Dess Sent: 12/22/2019  11:43 AM EST To: Corrie Mckusick, DO Subject: FW: Drain replacement                          Earleen Newport, please advise.  Thanks,  Lia Foyer ----- Message ----- From: Lynnea Maizes Sent: 12/22/2019  11:26 AM EST To: Danielle Dess Subject: RE: Drain replacement                          You will have to ask Wags--- probably new placement since only placed 11/18/19 But ask him sorry  ----- Message ----- From: Danielle Dess Sent: 12/22/2019  11:07 AM EST To: Ir Procedure Requests Subject: Drain replacement                              Morning,   Ms. Urive has a 10 [place] cholecystostomy. Last exchanged 11/26/19. She pulled the tube out on 12/21/19 in the afternoon. Went to the ED and they were unable to replace. [facility] is calling to get the drain replaced. Does she need to come in for a replacement or do I set her up for a new placement.   Thanks, Lia Foyer

## 2019-12-23 ENCOUNTER — Other Ambulatory Visit: Payer: Self-pay | Admitting: Radiology

## 2019-12-23 ENCOUNTER — Ambulatory Visit (HOSPITAL_COMMUNITY)
Admission: RE | Admit: 2019-12-23 | Discharge: 2019-12-23 | Disposition: A | Discharge status: Home / Self Care | Payer: Medicare Other | Source: Ambulatory Visit | Attending: Interventional Radiology | Admitting: Interventional Radiology

## 2019-12-23 ENCOUNTER — Other Ambulatory Visit: Payer: Self-pay

## 2019-12-23 DIAGNOSIS — K819 Cholecystitis, unspecified: Secondary | ICD-10-CM | POA: Diagnosis not present

## 2019-12-23 DIAGNOSIS — T85520A Displacement of bile duct prosthesis, initial encounter: Secondary | ICD-10-CM | POA: Diagnosis not present

## 2019-12-23 DIAGNOSIS — Z4803 Encounter for change or removal of drains: Secondary | ICD-10-CM | POA: Diagnosis not present

## 2019-12-23 HISTORY — PX: IR EXCHANGE BILIARY DRAIN: IMG6046

## 2019-12-23 MED ORDER — LIDOCAINE HCL 1 % IJ SOLN
INTRAMUSCULAR | Status: AC
  Filled 2019-12-23: qty 20

## 2019-12-23 MED ORDER — IOHEXOL 300 MG/ML  SOLN
50.0000 mL | Freq: Once | INTRAMUSCULAR | Status: AC | PRN
  Administered 2019-12-23: 10 mL

## 2019-12-23 MED ORDER — LIDOCAINE HCL (PF) 1 % IJ SOLN
INTRAMUSCULAR | Status: DC | PRN
  Administered 2019-12-23: 5 mL

## 2019-12-28 ENCOUNTER — Ambulatory Visit (HOSPITAL_COMMUNITY): Payer: Medicare Other

## 2019-12-30 ENCOUNTER — Encounter: Payer: Self-pay | Admitting: Adult Health

## 2019-12-30 ENCOUNTER — Ambulatory Visit (HOSPITAL_COMMUNITY): Payer: Medicare Other

## 2019-12-30 ENCOUNTER — Non-Acute Institutional Stay: Payer: Self-pay | Admitting: Adult Health

## 2019-12-30 DIAGNOSIS — K8012 Calculus of gallbladder with acute and chronic cholecystitis without obstruction: Secondary | ICD-10-CM

## 2019-12-30 NOTE — Progress Notes (Signed)
Location:    [facility] Room Number: 141/D Place of Service:  SNF (31)   CODE STATUS: Full Code  No Known Allergies  Chief Complaint  Patient presents with  . Medication Management    Medication Review     HPI:  She has ordered ultram 50 mg every 6 hours as needed for pain management due to her recent replacement bili drain placement. She has not required any ultram for the past 4 days. There are no reports of uncontrolled pain; no insomnia no anxiety.   Past Medical History:  Diagnosis Date  . Breast cancer ([place])   . Cognitive communication deficit   . Dementia ( [place])   . Difficulty in walking(719.7)   . Fall   . HTN (hypertension) 08/26/2016  . Muscle weakness (generalized)   . Rash and nonspecific skin eruption   . Thyroid disease    hypo  . Urinary tract infection, site not specified   . Vitamin B deficiency     Past Surgical History:  Procedure Laterality Date  . COLON SURGERY    . IR CATHETER TUBE CHANGE  05/07/2017  . IR EXCHANGE BILIARY DRAIN  07/02/2017  . IR EXCHANGE BILIARY DRAIN  07/10/2017  . IR EXCHANGE BILIARY DRAIN  08/28/2017  . IR EXCHANGE BILIARY DRAIN  10/11/2017  . IR EXCHANGE BILIARY DRAIN  10/28/2017  . IR EXCHANGE BILIARY DRAIN  12/23/2017  . IR EXCHANGE BILIARY DRAIN  01/23/2018  . IR EXCHANGE BILIARY DRAIN  02/17/2018  . IR EXCHANGE BILIARY DRAIN  03/20/2018  . IR EXCHANGE BILIARY DRAIN  04/17/2018  . IR EXCHANGE BILIARY DRAIN  05/29/2018  . IR EXCHANGE BILIARY DRAIN  07/24/2018  . IR EXCHANGE BILIARY DRAIN  09/22/2018  . IR EXCHANGE BILIARY DRAIN  11/20/2018  . IR EXCHANGE BILIARY DRAIN  02/11/2019  . IR EXCHANGE BILIARY DRAIN  04/14/2019  . IR EXCHANGE BILIARY DRAIN  06/09/2019  . IR EXCHANGE BILIARY DRAIN  11/26/2019  . IR EXCHANGE BILIARY DRAIN  12/23/2019  . IR PERC CHOLECYSTOSTOMY  01/27/2017  . IR PERC CHOLECYSTOSTOMY  11/18/2019  . IR RADIOLOGIST EVAL & MGMT  03/05/2017  . MASTECTOMY, PARTIAL Right     Social History    Socioeconomic History  . Marital status: Widowed    Spouse name: Not on file  . Number of children: Not on file  . Years of education: Not on file  . Highest education level: Not on file  Occupational History  . Not on file  Tobacco Use  . Smoking status: Never Smoker  . Smokeless tobacco: Never Used  Substance and Sexual Activity  . Alcohol use: No  . Drug use: No  . Sexual activity: Not on file  Other Topics Concern  . Not on file  Social History Narrative  . Not on file   Social Determinants of Health   Financial Resource Strain:   . Difficulty of Paying Living Expenses: Not on file  Food Insecurity:   . Worried About Charity fundraiser in the Last Year: Not on file  . Ran Out of Food in the Last Year: Not on file  Transportation Needs:   . Lack of Transportation (Medical): Not on file  . Lack of Transportation (Non-Medical): Not on file  Physical Activity:   . Days of Exercise per Week: Not on file  . Minutes of Exercise per Session: Not on file  Stress:   . Feeling of Stress : Not on file  Social  Connections:   . Frequency of Communication with Friends and Family: Not on file  . Frequency of Social Gatherings with Friends and Family: Not on file  . Attends Religious Services: Not on file  . Active Member of Clubs or Organizations: Not on file  . Attends Archivist Meetings: Not on file  . Marital Status: Not on file  Intimate Partner Violence:   . Fear of Current or Ex-Partner: Not on file  . Emotionally Abused: Not on file  . Physically Abused: Not on file  . Sexually Abused: Not on file   Family History  Problem Relation Age of Onset  . Cancer Father        ? primary  . Diabetes Neg Hx   . Heart disease Neg Hx   . Stroke Neg Hx       VITAL SIGNS BP (!) 118/58   Pulse 66   Temp (!) 97.5 F (36.4 C) (Oral)   Resp 20   Ht 5\' 3"  (1.6 m)   Wt 138 lb 3.2 oz (62.7 kg)   SpO2 93%   BMI 24.48 kg/m   Outpatient Encounter Medications  as of 12/30/2019  Medication Sig  . calcium carbonate (TUMS) 500 MG chewable tablet Chew 1 tablet by mouth daily.  . Cholecalciferol 1000 units tablet Take 1,000 Units by mouth daily. hypocalcemia  . Cyanocobalamin (VITAMIN B-12) 1000 MCG SUBL Place 1 tablet under the tongue at bedtime.  . donepezil (ARICEPT) 10 MG tablet Take 1 tablet by mouth at bedtime.  . NON FORMULARY NAS dieT  . Nutritional Supplements (ENSURE ENLIVE PO) Take 1.5 kcal by mouth 2 (two) times daily between meals.  . Nutritional Supplements (NUTRITIONAL SUPPLEMENT PO) Take 1 each by mouth at bedtime. Magic Cup  . polyethylene glycol (MIRALAX / GLYCOLAX) packet Take 17 g by mouth daily as needed for mild constipation or moderate constipation.   . sodium chloride irrigation 0.9 % irrigation Irrigate with 10 mLs as directed daily. Special Instructions: Flush catheter  . traMADol (ULTRAM) 50 MG tablet Take 50 mg by mouth every 6 (six) hours as needed.   No facility-administered encounter medications on file as of 12/30/2019.     SIGNIFICANT DIAGNOSTIC EXAMS   PREVIOUS;   08-26-19: ct of abdomen  1. Interval removal of the previously seen percutaneous cholecystostomy tube. Cholelithiasis with findings concerning for acute on chronic cholecystitis. Clinical correlation is recommended. Ultrasound may provide better evaluation of the gallbladder. 2. Diffuse colonic diverticulosis. No bowel obstruction. 3. Aortic Atherosclerosis   11-13-19: ct of abdomen and pelvis:  1. Persistently thickened gallbladder wall with numerous gallstones, including one at the level of the gallbladder neck. Surrounding pericholecystic fat stranding without a well-defined fluid collection or evidence of abscess. Findings are concerning for acute on chronic cholecystitis. Consider nuclear medicine hepatobiliary scan to evaluate patency of the cystic and common bile ducts. 2. Extensive colonic diverticulosis without evidence of  diverticulitis.  11-16-19: chest x-ray: Very mild right basilar linear atelectasis.   11-16-19: abdominal ultrasound: Acute cholecystitis.   11-18-19: chole tube placement: Successful ultrasound and fluoroscopic 10 French cholecystostomy   NO NEW EXAMS.    LABS REVIEWED: PREVIOUS:   02-17-19: wbc 6.1; hgb 12.9; hct 41.5; mcv 98.3 plt 269 glucose 92; bun 18; creat 0.91; k+ 4.3; na++ 139; ca 8.8 liver normal albumin 3.5  06-24-19; wbc 6.0; hgb 12.5; hct 40.1; mcv 97.1; plt 255; glucose 96; bun 17; creat 0.87; k+ 4.2; na++ 136; ca 8.6; albumin 3.5; lipase  39 07-09-19: liver normal albumin 3.6 07-21-19: wbc 5.2; hgb 11.9; hct 38.5; mcv 97.0 plt 234; glucose 92; bun 19; creat 0.89; k+ 4.1; na++ 138; ca 8.6 08-26-19: wbc 11.8; hgb 11.3; hct 35.7; mcv 96.5 plt 227; glucose 97; bun 31; creat 1.02; k+ 3.8; na++ 139; ca 8.6; liver normal albumin 2.8   11-16-19: wbc 10.3; hgb 12.1; hct 39.3 mcv 98.5 plt 233; glucose 122; bun 31; creat 1.05; k 3.7 na++ 138; ca 8.5 ast 103 alt 81; albumin 3.0 1-28--21: wbc 12.3; hgb 11.2; hct 35.2; mcv 95.4 plt 265; glucose 88; bun 20; creat 0.95 ;k+ 3.7; na++ 148; ca 8.5 liver normal albumin 2.5 mag 2.3 11-21-19: wbc 8.9; hgb 10.5; hct 33.2; mcv 96.5 plt 265; glucose 95; bun 12; creat 0.86; k+ 3.5; na++ 140; ca 8.1 mag 1.9   NO NEW LABS.   Review of Systems  Constitutional: Negative for malaise/fatigue.  Respiratory: Negative for cough and shortness of breath.   Cardiovascular: Negative for chest pain, palpitations and leg swelling.  Gastrointestinal: Negative for abdominal pain, constipation and heartburn.  Musculoskeletal: Negative for back pain, joint pain and myalgias.  Skin: Negative.   Neurological: Negative for dizziness.  Psychiatric/Behavioral: The patient is not nervous/anxious.     Physical Exam Constitutional:      General: She is not in acute distress.    Appearance: She is well-developed. She is not diaphoretic.  Neck:     Thyroid: No thyromegaly.   Cardiovascular:     Rate and Rhythm: Normal rate and regular rhythm.     Pulses: Normal pulses.     Heart sounds: Normal heart sounds.  Pulmonary:     Effort: Pulmonary effort is normal. No respiratory distress.     Breath sounds: Normal breath sounds.  Chest:     Comments:  History of right partial mastectomy Abdominal:     General: Bowel sounds are normal. There is no distension.     Palpations: Abdomen is soft.     Tenderness: There is no abdominal tenderness.     Comments: History of right hemi colectomy  Bili drain site with dressing intact    Musculoskeletal:        General: Normal range of motion.     Cervical back: Neck supple.     Right lower leg: No edema.     Left lower leg: No edema.  Lymphadenopathy:     Cervical: No cervical adenopathy.  Skin:    General: Skin is warm and dry.  Neurological:     Mental Status: She is alert. Mental status is at baseline.  Psychiatric:        Mood and Affect: Mood normal.       ASSESSMENT/ PLAN:  TODAY  1. Calculus of gall bladder with acute on chronic cholecystitis without obstruction Will stop ultram at this time and will monitor her status.      MD is aware of resident's narcotic use and is in agreement with current plan of care. We will attempt to wean resident as appropriate.  Ok Edwards NP [facility]  Contact [phone] Monday through Friday 8am- 5pm  After hours call [phone]

## 2020-01-07 ENCOUNTER — Ambulatory Visit (HOSPITAL_COMMUNITY): Payer: Medicare Other

## 2020-01-26 DIAGNOSIS — Z1159 Encounter for screening for other viral diseases: Secondary | ICD-10-CM | POA: Diagnosis not present

## 2020-01-27 ENCOUNTER — Encounter: Payer: Self-pay | Admitting: Internal Medicine

## 2020-01-27 ENCOUNTER — Non-Acute Institutional Stay (SKILLED_NURSING_FACILITY): Payer: Medicare Other | Admitting: Internal Medicine

## 2020-01-27 DIAGNOSIS — E559 Vitamin D deficiency, unspecified: Secondary | ICD-10-CM

## 2020-01-27 DIAGNOSIS — E538 Deficiency of other specified B group vitamins: Secondary | ICD-10-CM | POA: Diagnosis not present

## 2020-01-27 DIAGNOSIS — F0391 Unspecified dementia with behavioral disturbance: Secondary | ICD-10-CM

## 2020-01-27 NOTE — Progress Notes (Signed)
Location:  [facility] Room Number: 141-D Place of Service:  SNF (31)  Cynthia Duos, MD  Patient Care Team: Cynthia Duos, MD as PCP - General (Internal Medicine) Cynthia [name] Cynthia Bougie, NP as Nurse Practitioner ([place]) [facility], [place] ([place])  Extended Emergency Contact Information Primary Emergency Contact: Cynthia [name]  [place] Phone: [phone] Relation: Daughter    Allergies: Patient has no known allergies.  Chief Complaint  Patient presents with  . Medical Management of Chronic Issues    Routine [place] visit    HPI: Patient is a 86 y.o. female who is being seen for routine issues of vitamin D deficiency, vitamin B12 deficiency, and dementia.  Past Medical History:  Diagnosis Date  . Breast cancer ([place])   . Cognitive communication deficit   . Dementia ([place])   . Difficulty in walking(719.7)   . Fall   . HTN (hypertension) 08/26/2016  . Muscle weakness (generalized)   . Rash and nonspecific skin eruption   . Thyroid disease    hypo  . Urinary tract infection, site not specified   . Vitamin B deficiency     Past Surgical History:  Procedure Laterality Date  . COLON SURGERY    . IR CATHETER TUBE CHANGE  05/07/2017  . IR EXCHANGE BILIARY DRAIN  07/02/2017  . IR EXCHANGE BILIARY DRAIN  07/10/2017  . IR EXCHANGE BILIARY DRAIN  08/28/2017  . IR EXCHANGE BILIARY DRAIN  10/11/2017  . IR EXCHANGE BILIARY DRAIN  10/28/2017  . IR EXCHANGE BILIARY DRAIN  12/23/2017  . IR EXCHANGE BILIARY DRAIN  01/23/2018  . IR EXCHANGE BILIARY DRAIN  02/17/2018  . IR EXCHANGE BILIARY DRAIN  03/20/2018  . IR EXCHANGE BILIARY DRAIN  04/17/2018  . IR EXCHANGE BILIARY DRAIN  05/29/2018  . IR EXCHANGE BILIARY DRAIN  07/24/2018  . IR EXCHANGE BILIARY DRAIN  09/22/2018  . IR EXCHANGE BILIARY DRAIN  11/20/2018  . IR EXCHANGE BILIARY DRAIN  02/11/2019  . IR EXCHANGE BILIARY DRAIN  04/14/2019  . IR  EXCHANGE BILIARY DRAIN  06/09/2019  . IR EXCHANGE BILIARY DRAIN  11/26/2019  . IR EXCHANGE BILIARY DRAIN  12/23/2019  . IR PERC CHOLECYSTOSTOMY  01/27/2017  . IR PERC CHOLECYSTOSTOMY  11/18/2019  . IR RADIOLOGIST EVAL & MGMT  03/05/2017  . MASTECTOMY, PARTIAL Right     Allergies as of 01/27/2020   No Known Allergies     Medication List    Notice   This visit is during an admission. Changes to the med list made in this visit will be reflected in the After Visit Summary of the admission.    Current Outpatient Medications on File Prior to Visit  Medication Sig Dispense Refill  . calcium carbonate (TUMS) 500 MG chewable tablet Chew 1 tablet by mouth daily.    . Cholecalciferol 1000 units tablet Take 1,000 Units by mouth daily. hypocalcemia    . Cyanocobalamin (VITAMIN B-12) 1000 MCG SUBL Place 1 tablet under the tongue at bedtime.    . donepezil (ARICEPT) 10 MG tablet Take 1 tablet by mouth at bedtime.    . NON FORMULARY NAS dieT    . Nutritional Supplements (ENSURE ENLIVE PO) Take 1.5 kcal by mouth 2 (two) times daily between meals.    . Nutritional Supplements (NUTRITIONAL SUPPLEMENT PO) Take 1 each by mouth at bedtime. Magic Cup    . polyethylene glycol (MIRALAX / GLYCOLAX) packet Take 17 g by mouth daily  as needed for mild constipation or moderate constipation.     . sodium chloride irrigation 0.9 % irrigation Irrigate with 10 mLs as directed daily. Special Instructions: Flush catheter     No current facility-administered medications on file prior to visit.     No orders of the defined types were placed in this encounter.   Immunization History  Administered Date(s) Administered  . Influenza-Unspecified 07/28/2014, 07/26/2017, 07/24/2018, 07/27/2019  . Moderna SARS-COVID-2 Vaccination 10/28/2019  . Pneumococcal Conjugate-13 06/21/2017  . Pneumococcal-Unspecified 08/01/2016  . Tdap 07/11/2017    Social History   Tobacco Use  . Smoking status: Never Smoker  . Smokeless tobacco:  Never Used  Substance Use Topics  . Alcohol use: No    Review of Systems  GENERAL:  no fevers, fatigue, appetite changes SKIN: No itching, rash HEENT: No complaint RESPIRATORY: No cough, wheezing, SOB CARDIAC: No chest pain, palpitations, lower extremity edema  GI: No abdominal pain, No N/V/D or constipation, No heartburn or reflux  GU: No dysuria, frequency or urgency, or incontinence  MUSCULOSKELETAL: No unrelieved bone/joint pain NEUROLOGIC: No headache, dizziness  PSYCHIATRIC: No overt anxiety or sadness  Vitals:   01/27/20 1603  BP: 106/61  Pulse: 75  Resp: 16  Temp: (!) 97 F (36.1 C)  SpO2: 93%   Body mass index is 25.33 kg/m. Physical Exam  GENERAL APPEARANCE: Alert, conversant, No acute distress  SKIN: No diaphoresis rash HEENT: Unremarkable RESPIRATORY: Breathing is even, unlabored. Lung sounds are clear   CARDIOVASCULAR: Heart RRR no murmurs, rubs or gallops. No peripheral edema  GASTROINTESTINAL: Abdomen is soft, non-tender, not distended w/ normal bowel sounds.  GENITOURINARY: Bladder non tender, not distended  MUSCULOSKELETAL: No abnormal joints or musculature NEUROLOGIC: Cranial nerves 2-12 grossly intact. Moves all extremities PSYCHIATRIC: Mood and affect appropriate with dementia, no behavioral issues  Patient Active Problem List   Diagnosis Date Noted  . Vitamin B12 deficiency 11/28/2019  . Vitamin D deficiency 11/28/2019  . Acute cholecystitis 11/16/2019  . COVID-19 virus infection 11/02/2019  . Abscess of skin of abdomen 08/29/2019  . Chronic constipation 12/20/2018  . S/P Cholecystostomy 01/31/2017  . Calculus of gallbladder with acute on chronic cholecystitis without obstruction 01/26/2017  . HTN (hypertension) 08/26/2016  . Breast cancer ([place]) 12/11/2013  . Benign neoplasm of colon 12/11/2013  . Dementia ([place]) 12/11/2013  . C2 cervical fracture (HCC) 12/08/2013    CMP     Component Value Date/Time   NA 139 12/15/2019 0901   NA 141  10/27/2015 0000   K 4.2 12/15/2019 0901   CL 106 12/15/2019 0901   CO2 27 12/15/2019 0901   GLUCOSE 88 12/15/2019 0901   BUN 14 12/15/2019 0901   BUN 13 10/27/2015 0000   CREATININE 0.84 12/15/2019 0901   CALCIUM 8.6 (L) 12/15/2019 0901   PROT 5.8 (L) 11/20/2019 0516   ALBUMIN 2.3 (L) 11/20/2019 0516   AST 14 (L) 11/20/2019 0516   ALT 25 11/20/2019 0516   ALKPHOS 59 11/20/2019 0516   BILITOT 0.7 11/20/2019 0516   GFRNONAA >60 12/15/2019 0901   GFRAA >60 12/15/2019 0901   Recent Labs    11/19/19 0606 11/19/19 0606 11/20/19 0516 11/21/19 0533 12/15/19 0901  NA 148*   < > 145 140 139  K 3.7   < > 3.3* 3.5 4.2  CL 114*   < > 109 106 106  CO2 26   < > 27 26 27   GLUCOSE 88   < > 99 95 88  BUN  20   < > 16 12 14   CREATININE 0.95   < > 0.96 0.86 0.84  CALCIUM 8.5*   < > 8.3* 8.1* 8.6*  MG 2.3  --   --  1.9  --    < > = values in this interval not displayed.   Recent Labs    11/17/19 0634 11/19/19 0606 11/20/19 0516  AST 66* 17 14*  ALT 75* 38 25  ALKPHOS 76 72 59  BILITOT 0.7 1.0 0.7  PROT 6.4* 6.4* 5.8*  ALBUMIN 2.5* 2.5* 2.3*   Recent Labs    08/26/19 1215 08/26/19 1215 10/29/19 1430 10/29/19 1430 11/16/19 1620 11/17/19 0634 11/19/19 0606 11/20/19 0516 11/21/19 0533  WBC 11.8*   < > 5.6   < > 10.3   < > 12.3* 11.4* 8.9  NEUTROABS 10.1*  --  3.9  --  8.5*  --   --   --   --   HGB 11.3*   < > 12.1   < > 12.1   < > 11.2* 10.0* 10.5*  HCT 35.7*   < > 38.4   < > 39.3   < > 35.2* 31.4* 33.2*  MCV 96.5   < > 98.2   < > 98.5   < > 95.4 94.9 96.5  PLT 227   < > 213   < > 233   < > 265 262 265   < > = values in this interval not displayed.   No results for input(s): CHOL, LDLCALC, TRIG in the last 8760 hours.  Invalid input(s): HCL No results found for: [facility] Lab Results  Component Value Date   TSH 3.877 01/02/2018   No results found for: HGBA1C No results found for: CHOL, HDL, LDLCALC, LDLDIRECT, TRIG, CHOLHDL  Significant Diagnostic Results in last  30 days:  No results found.  Assessment and Plan  Vitamin D deficiency Stable; maintained on vitamin D at 1000 units daily; continue vitamin D daily  Vitamin B12 deficiency Stable; maintained on 1000 mcg sublingual nightly; continue B12 nightly  Dementia (HCC) Stable; continue Aricept 10 mg nightly     Cynthia Duos, MD

## 2020-02-02 ENCOUNTER — Encounter: Payer: Self-pay | Admitting: Internal Medicine

## 2020-02-02 NOTE — Assessment & Plan Note (Signed)
Stable; maintained on vitamin D at 1000 units daily; continue vitamin D daily

## 2020-02-02 NOTE — Assessment & Plan Note (Signed)
Stable; maintained on 1000 mcg sublingual nightly; continue B12 nightly

## 2020-02-02 NOTE — Assessment & Plan Note (Signed)
Stable; continue Aricept 10 mg nightly

## 2020-02-11 ENCOUNTER — Non-Acute Institutional Stay (SKILLED_NURSING_FACILITY): Payer: Medicare Other | Admitting: Adult Health

## 2020-02-11 ENCOUNTER — Encounter: Payer: Self-pay | Admitting: Adult Health

## 2020-02-11 DIAGNOSIS — F0391 Unspecified dementia with behavioral disturbance: Secondary | ICD-10-CM | POA: Diagnosis not present

## 2020-02-11 DIAGNOSIS — I10 Essential (primary) hypertension: Secondary | ICD-10-CM | POA: Diagnosis not present

## 2020-02-11 DIAGNOSIS — K8012 Calculus of gallbladder with acute and chronic cholecystitis without obstruction: Secondary | ICD-10-CM | POA: Diagnosis not present

## 2020-02-11 NOTE — Progress Notes (Signed)
Location:    [facility] Room Number: 141/D Place of Service:  SNF (31)   CODE STATUS: Full Code  No Known Allergies  Chief Complaint  Patient presents with  . Acute Visit    Care Plan Meeting    HPI:  We have come together for her care plan meeting. BIMS 5/15 mood 0/30. There are no reports of falls. Her weight is stable. She requires limited assistance with adls. She is incontinent of urine and occasional with stool. There are no reports of uncontrolled pain. She continues to be followed for her chronic illnesses; hypertension; dementia; gall stones.   Past Medical History:  Diagnosis Date  . Breast cancer ([place])   . Cognitive communication deficit   . Dementia ([place])   . Difficulty in walking(719.7)   . Fall   . HTN (hypertension) 08/26/2016  . Muscle weakness (generalized)   . Rash and nonspecific skin eruption   . Thyroid disease    hypo  . Urinary tract infection, site not specified   . Vitamin B deficiency     Past Surgical History:  Procedure Laterality Date  . COLON SURGERY    . IR CATHETER TUBE CHANGE  05/07/2017  . IR EXCHANGE BILIARY DRAIN  07/02/2017  . IR EXCHANGE BILIARY DRAIN  07/10/2017  . IR EXCHANGE BILIARY DRAIN  08/28/2017  . IR EXCHANGE BILIARY DRAIN  10/11/2017  . IR EXCHANGE BILIARY DRAIN  10/28/2017  . IR EXCHANGE BILIARY DRAIN  12/23/2017  . IR EXCHANGE BILIARY DRAIN  01/23/2018  . IR EXCHANGE BILIARY DRAIN  02/17/2018  . IR EXCHANGE BILIARY DRAIN  03/20/2018  . IR EXCHANGE BILIARY DRAIN  04/17/2018  . IR EXCHANGE BILIARY DRAIN  05/29/2018  . IR EXCHANGE BILIARY DRAIN  07/24/2018  . IR EXCHANGE BILIARY DRAIN  09/22/2018  . IR EXCHANGE BILIARY DRAIN  11/20/2018  . IR EXCHANGE BILIARY DRAIN  02/11/2019  . IR EXCHANGE BILIARY DRAIN  04/14/2019  . IR EXCHANGE BILIARY DRAIN  06/09/2019  . IR EXCHANGE BILIARY DRAIN  11/26/2019  . IR EXCHANGE BILIARY DRAIN  12/23/2019  . IR PERC CHOLECYSTOSTOMY  01/27/2017  . IR PERC CHOLECYSTOSTOMY  11/18/2019    . IR RADIOLOGIST EVAL & MGMT  03/05/2017  . MASTECTOMY, PARTIAL Right     Social History   Socioeconomic History  . Marital status: Widowed    Spouse name: Not on file  . Number of children: Not on file  . Years of education: Not on file  . Highest education level: Not on file  Occupational History  . Not on file  Tobacco Use  . Smoking status: Never Smoker  . Smokeless tobacco: Never Used  Substance and Sexual Activity  . Alcohol use: No  . Drug use: No  . Sexual activity: Not on file  Other Topics Concern  . Not on file  Social History Narrative  . Not on file   Social Determinants of Health   Financial Resource Strain:   . Difficulty of Paying Living Expenses:   Food Insecurity:   . Worried About Charity fundraiser in the Last Year:   . Arboriculturist in the Last Year:   Transportation Needs:   . Film/video editor (Medical):   Marland Kitchen Lack of Transportation (Non-Medical):   Physical Activity:   . Days of Exercise per Week:   . Minutes of Exercise per Session:   Stress:   . Feeling of Stress :   Social Connections:   .  Frequency of Communication with Friends and Family:   . Frequency of Social Gatherings with Friends and Family:   . Attends Religious Services:   . Active Member of Clubs or Organizations:   . Attends Archivist Meetings:   Marland Kitchen Marital Status:   Intimate Partner Violence:   . Fear of Current or Ex-Partner:   . Emotionally Abused:   Marland Kitchen Physically Abused:   . Sexually Abused:    Family History  Problem Relation Age of Onset  . Cancer Father        ? primary  . Diabetes Neg Hx   . Heart disease Neg Hx   . Stroke Neg Hx       VITAL SIGNS BP (!) 141/62   Pulse 76   Temp (!) 97.2 F (36.2 C) (Oral)   Resp 16   Ht 5\' 3"  (1.6 m)   Wt 143 lb (64.9 kg)   SpO2 93%   BMI 25.33 kg/m   Outpatient Encounter Medications as of 02/11/2020  Medication Sig  . calcium carbonate (TUMS) 500 MG chewable tablet Chew 1 tablet by mouth  daily.  . Cholecalciferol 1000 units tablet Take 1,000 Units by mouth daily. hypocalcemia  . Cyanocobalamin (VITAMIN B-12) 1000 MCG SUBL Place 1 tablet under the tongue at bedtime.  . donepezil (ARICEPT) 10 MG tablet Take 1 tablet by mouth at bedtime.  . NON FORMULARY NAS dieT  . Nutritional Supplements (ENSURE ENLIVE PO) Take 1.5 kcal by mouth 2 (two) times daily between meals.  . Nutritional Supplements (NUTRITIONAL SUPPLEMENT PO) Take 1 each by mouth at bedtime. Magic Cup  . polyethylene glycol (MIRALAX / GLYCOLAX) packet Take 17 g by mouth daily as needed for mild constipation or moderate constipation.   . sodium chloride irrigation 0.9 % irrigation Irrigate with 10 mLs as directed daily. Special Instructions: Flush catheter   No facility-administered encounter medications on file as of 02/11/2020.     SIGNIFICANT DIAGNOSTIC EXAMS   PREVIOUS;   08-26-19: ct of abdomen  1. Interval removal of the previously seen percutaneous cholecystostomy tube. Cholelithiasis with findings concerning for acute on chronic cholecystitis. Clinical correlation is recommended. Ultrasound may provide better evaluation of the gallbladder. 2. Diffuse colonic diverticulosis. No bowel obstruction. 3. Aortic Atherosclerosis   11-13-19: ct of abdomen and pelvis:  1. Persistently thickened gallbladder wall with numerous gallstones, including one at the level of the gallbladder neck. Surrounding pericholecystic fat stranding without a well-defined fluid collection or evidence of abscess. Findings are concerning for acute on chronic cholecystitis. Consider nuclear medicine hepatobiliary scan to evaluate patency of the cystic and common bile ducts. 2. Extensive colonic diverticulosis without evidence of diverticulitis.  11-16-19: chest x-ray: Very mild right basilar linear atelectasis.   11-16-19: abdominal ultrasound: Acute cholecystitis.   11-18-19: chole tube placement: Successful ultrasound and fluoroscopic 10  French cholecystostomy   NO NEW EXAMS.    LABS REVIEWED: PREVIOUS:   02-17-19: wbc 6.1; hgb 12.9; hct 41.5; mcv 98.3 plt 269 glucose 92; bun 18; creat 0.91; k+ 4.3; na++ 139; ca 8.8 liver normal albumin 3.5  06-24-19; wbc 6.0; hgb 12.5; hct 40.1; mcv 97.1; plt 255; glucose 96; bun 17; creat 0.87; k+ 4.2; na++ 136; ca 8.6; albumin 3.5; lipase 39 07-09-19: liver normal albumin 3.6 07-21-19: wbc 5.2; hgb 11.9; hct 38.5; mcv 97.0 plt 234; glucose 92; bun 19; creat 0.89; k+ 4.1; na++ 138; ca 8.6 08-26-19: wbc 11.8; hgb 11.3; hct 35.7; mcv 96.5 plt 227; glucose 97; bun 31;  creat 1.02; k+ 3.8; na++ 139; ca 8.6; liver normal albumin 2.8   11-16-19: wbc 10.3; hgb 12.1; hct 39.3 mcv 98.5 plt 233; glucose 122; bun 31; creat 1.05; k 3.7 na++ 138; ca 8.5 ast 103 alt 81; albumin 3.0 1-28--21: wbc 12.3; hgb 11.2; hct 35.2; mcv 95.4 plt 265; glucose 88; bun 20; creat 0.95 ;k+ 3.7; na++ 148; ca 8.5 liver normal albumin 2.5 mag 2.3 11-21-19: wbc 8.9; hgb 10.5; hct 33.2; mcv 96.5 plt 265; glucose 95; bun 12; creat 0.86; k+ 3.5; na++ 140; ca 8.1 mag 1.9   NO NEW LABS.   Review of Systems  Constitutional: Negative for malaise/fatigue.  Respiratory: Negative for cough and shortness of breath.   Cardiovascular: Negative for chest pain, palpitations and leg swelling.  Gastrointestinal: Negative for abdominal pain, constipation and heartburn.  Musculoskeletal: Negative for back pain, joint pain and myalgias.  Skin: Negative.   Neurological: Negative for dizziness.  Psychiatric/Behavioral: The patient is not nervous/anxious.     Physical Exam Constitutional:      General: She is not in acute distress.    Appearance: She is well-developed. She is not diaphoretic.  Neck:     Thyroid: No thyromegaly.  Cardiovascular:     Rate and Rhythm: Normal rate and regular rhythm.     Heart sounds: Normal heart sounds.  Pulmonary:     Effort: Pulmonary effort is normal. No respiratory distress.     Breath sounds: Normal  breath sounds.  Chest:     Comments: History of right partial mastectomy Abdominal:     General: Bowel sounds are normal. There is no distension.     Palpations: Abdomen is soft.     Tenderness: There is no abdominal tenderness.     Comments: History of right hemi colectomy  Bili drain site without signs of infection   Musculoskeletal:        General: Normal range of motion.  Lymphadenopathy:     Cervical: No cervical adenopathy.  Skin:    General: Skin is warm and dry.  Neurological:     Mental Status: She is alert. Mental status is at baseline.  Psychiatric:        Mood and Affect: Mood normal.       ASSESSMENT/ PLAN:  TODAY  1. Essential hypertension 2. Dementia without behavioral disturbance unspecified dementia type.  3. Calculus of gallbladder with acute on chronic cholecystitis without obstruction  Will continue current medications Will continue current plan of care Will continue to monitor her status.     MD is aware of resident's narcotic use and is in agreement with current plan of care. We will attempt to wean resident as appropriate.  Ok Edwards NP [facility]  Contact [phone] Monday through Friday 8am- 5pm  After hours call [phone]

## 2020-02-17 ENCOUNTER — Ambulatory Visit (HOSPITAL_COMMUNITY): Admit: 2020-02-17 | Payer: Medicare Other

## 2020-02-17 DIAGNOSIS — Z1159 Encounter for screening for other viral diseases: Secondary | ICD-10-CM | POA: Diagnosis not present

## 2020-02-22 ENCOUNTER — Inpatient Hospital Stay (HOSPITAL_COMMUNITY)
Admission: RE | Admit: 2020-02-22 | Discharge: 2020-02-22 | DRG: 950 | Disposition: A | Discharge status: Home / Self Care | Payer: Medicare Other | Source: Ambulatory Visit | Attending: Interventional Radiology | Admitting: Interventional Radiology

## 2020-02-22 DIAGNOSIS — K8020 Calculus of gallbladder without cholecystitis without obstruction: Secondary | ICD-10-CM | POA: Diagnosis not present

## 2020-02-22 DIAGNOSIS — Z434 Encounter for attention to other artificial openings of digestive tract: Secondary | ICD-10-CM | POA: Diagnosis not present

## 2020-02-22 HISTORY — PX: IR EXCHANGE BILIARY DRAIN: IMG6046

## 2020-02-22 MED ORDER — IOHEXOL 300 MG/ML  SOLN
50.0000 mL | Freq: Once | INTRAMUSCULAR | Status: AC | PRN
  Administered 2020-02-22: 10 mL

## 2020-02-22 MED ORDER — LIDOCAINE HCL 1 % IJ SOLN
INTRAMUSCULAR | Status: AC
  Filled 2020-02-22: qty 20

## 2020-02-22 MED ORDER — LIDOCAINE HCL 1 % IJ SOLN
INTRAMUSCULAR | Status: DC | PRN
  Administered 2020-02-22: 10 mL

## 2020-03-02 ENCOUNTER — Encounter: Payer: Self-pay | Admitting: Adult Health

## 2020-03-02 ENCOUNTER — Non-Acute Institutional Stay (SKILLED_NURSING_FACILITY): Payer: Medicare Other | Admitting: Adult Health

## 2020-03-02 DIAGNOSIS — K5909 Other constipation: Secondary | ICD-10-CM | POA: Diagnosis not present

## 2020-03-02 DIAGNOSIS — C50911 Malignant neoplasm of unspecified site of right female breast: Secondary | ICD-10-CM

## 2020-03-02 DIAGNOSIS — I10 Essential (primary) hypertension: Secondary | ICD-10-CM | POA: Diagnosis not present

## 2020-03-02 NOTE — Progress Notes (Signed)
Location:    [place] Room Number: 141/D Place of Service:  SNF (31)   CODE STATUS: Full Code  No Known Allergies  Chief Complaint  Patient presents with  . Medical Management of Chronic Issues           Essential hypertension:  Chronic constipation. Malignant neoplasm of right female breast unspecified estrogen receptor site of breast    HPI:  She is a 86 year old long term resident of this facility being seen for the management of her chronic illnesses: hypertension; constipation right breast cancer. There are no reports of uncontrolled pain; no changes in her appetite; no reports of anxiety or agitation.   Past Medical History:  Diagnosis Date  . Breast cancer ([place])   . Cognitive communication deficit   . Dementia ([place])   . Difficulty in walking(719.7)   . Fall   . HTN (hypertension) 08/26/2016  . Muscle weakness (generalized)   . Rash and nonspecific skin eruption   . Thyroid disease    hypo  . Urinary tract infection, site not specified   . Vitamin B deficiency     Past Surgical History:  Procedure Laterality Date  . COLON SURGERY    . IR CATHETER TUBE CHANGE  05/07/2017  . IR EXCHANGE BILIARY DRAIN  07/02/2017  . IR EXCHANGE BILIARY DRAIN  07/10/2017  . IR EXCHANGE BILIARY DRAIN  08/28/2017  . IR EXCHANGE BILIARY DRAIN  10/11/2017  . IR EXCHANGE BILIARY DRAIN  10/28/2017  . IR EXCHANGE BILIARY DRAIN  12/23/2017  . IR EXCHANGE BILIARY DRAIN  01/23/2018  . IR EXCHANGE BILIARY DRAIN  02/17/2018  . IR EXCHANGE BILIARY DRAIN  03/20/2018  . IR EXCHANGE BILIARY DRAIN  04/17/2018  . IR EXCHANGE BILIARY DRAIN  05/29/2018  . IR EXCHANGE BILIARY DRAIN  07/24/2018  . IR EXCHANGE BILIARY DRAIN  09/22/2018  . IR EXCHANGE BILIARY DRAIN  11/20/2018  . IR EXCHANGE BILIARY DRAIN  02/11/2019  . IR EXCHANGE BILIARY DRAIN  04/14/2019  . IR EXCHANGE BILIARY DRAIN  06/09/2019  . IR EXCHANGE BILIARY DRAIN  11/26/2019  . IR EXCHANGE BILIARY DRAIN  12/23/2019  . IR EXCHANGE  BILIARY DRAIN  02/22/2020  . IR PERC CHOLECYSTOSTOMY  01/27/2017  . IR PERC CHOLECYSTOSTOMY  11/18/2019  . IR RADIOLOGIST EVAL & MGMT  03/05/2017  . MASTECTOMY, PARTIAL Right     Social History   Socioeconomic History  . Marital status: Widowed    Spouse name: Not on file  . Number of children: Not on file  . Years of education: Not on file  . Highest education level: Not on file  Occupational History  . Not on file  Tobacco Use  . Smoking status: Never Smoker  . Smokeless tobacco: Never Used  Substance and Sexual Activity  . Alcohol use: No  . Drug use: No  . Sexual activity: Not on file  Other Topics Concern  . Not on file  Social History Narrative  . Not on file   Social Determinants of Health   Financial Resource Strain:   . Difficulty of Paying Living Expenses:   Food Insecurity:   . Worried About Charity fundraiser in the Last Year:   . Arboriculturist in the Last Year:   Transportation Needs:   . Film/video editor (Medical):   Marland Kitchen Lack of Transportation (Non-Medical):   Physical Activity:   . Days of Exercise per Week:   . Minutes of  Exercise per Session:   Stress:   . Feeling of Stress :   Social Connections:   . Frequency of Communication with Friends and Family:   . Frequency of Social Gatherings with Friends and Family:   . Attends Religious Services:   . Active Member of Clubs or Organizations:   . Attends Archivist Meetings:   Marland Kitchen Marital Status:   Intimate Partner Violence:   . Fear of Current or Ex-Partner:   . Emotionally Abused:   Marland Kitchen Physically Abused:   . Sexually Abused:    Family History  Problem Relation Age of Onset  . Cancer Father        ? primary  . Diabetes Neg Hx   . Heart disease Neg Hx   . Stroke Neg Hx       VITAL SIGNS BP 135/69   Pulse 79   Temp 98 F (36.7 C) (Oral)   Resp 20   Ht 5\' 3"  (1.6 m)   Wt 146 lb 9.6 oz (66.5 kg)   SpO2 93%   BMI 25.97 kg/m   Outpatient Encounter Medications as of  03/02/2020  Medication Sig  . calcium carbonate (TUMS) 500 MG chewable tablet Chew 1 tablet by mouth daily.  . Cholecalciferol 1000 units tablet Take 1,000 Units by mouth daily. hypocalcemia  . Cyanocobalamin (VITAMIN B-12) 1000 MCG SUBL Place 1 tablet under the tongue at bedtime.  . donepezil (ARICEPT) 10 MG tablet Take 1 tablet by mouth at bedtime.  . NON FORMULARY NAS dieT  . Nutritional Supplements (ENSURE ENLIVE PO) Take 1.5 kcal by mouth 2 (two) times daily between meals.  . Nutritional Supplements (NUTRITIONAL SUPPLEMENT PO) Take 1 each by mouth at bedtime. Magic Cup  . polyethylene glycol (MIRALAX / GLYCOLAX) packet Take 17 g by mouth daily as needed for mild constipation or moderate constipation.   . sodium chloride irrigation 0.9 % irrigation Irrigate with 5 mLs as directed daily. Special Instructions:  flush cholecystostomy daily   No facility-administered encounter medications on file as of 03/02/2020.     SIGNIFICANT DIAGNOSTIC EXAMS   PREVIOUS;   08-26-19: ct of abdomen  1. Interval removal of the previously seen percutaneous cholecystostomy tube. Cholelithiasis with findings concerning for acute on chronic cholecystitis. Clinical correlation is recommended. Ultrasound may provide better evaluation of the gallbladder. 2. Diffuse colonic diverticulosis. No bowel obstruction. 3. Aortic Atherosclerosis   11-13-19: ct of abdomen and pelvis:  1. Persistently thickened gallbladder wall with numerous gallstones, including one at the level of the gallbladder neck. Surrounding pericholecystic fat stranding without a well-defined fluid collection or evidence of abscess. Findings are concerning for acute on chronic cholecystitis. Consider nuclear medicine hepatobiliary scan to evaluate patency of the cystic and common bile ducts. 2. Extensive colonic diverticulosis without evidence of diverticulitis.  11-16-19: chest x-ray: Very mild right basilar linear atelectasis.   11-16-19:  abdominal ultrasound: Acute cholecystitis.   11-18-19: chole tube placement: Successful ultrasound and fluoroscopic 10 French cholecystostomy   NO NEW EXAMS.    LABS REVIEWED: PREVIOUS:   06-24-19; wbc 6.0; hgb 12.5; hct 40.1; mcv 97.1; plt 255; glucose 96; bun 17; creat 0.87; k+ 4.2; na++ 136; ca 8.6; albumin 3.5; lipase 39 07-09-19: liver normal albumin 3.6 07-21-19: wbc 5.2; hgb 11.9; hct 38.5; mcv 97.0 plt 234; glucose 92; bun 19; creat 0.89; k+ 4.1; na++ 138; ca 8.6 08-26-19: wbc 11.8; hgb 11.3; hct 35.7; mcv 96.5 plt 227; glucose 97; bun 31; creat 1.02; k+ 3.8; na++ 139;  ca 8.6; liver normal albumin 2.8   11-16-19: wbc 10.3; hgb 12.1; hct 39.3 mcv 98.5 plt 233; glucose 122; bun 31; creat 1.05; k 3.7 na++ 138; ca 8.5 ast 103 alt 81; albumin 3.0 1-28--21: wbc 12.3; hgb 11.2; hct 35.2; mcv 95.4 plt 265; glucose 88; bun 20; creat 0.95 ;k+ 3.7; na++ 148; ca 8.5 liver normal albumin 2.5 mag 2.3 11-21-19: wbc 8.9; hgb 10.5; hct 33.2; mcv 96.5 plt 265; glucose 95; bun 12; creat 0.86; k+ 3.5; na++ 140; ca 8.1 mag 1.9   TODAY  12-15-19: glucose 88; bun 14; creat 0.84; k+ 4.2; na++ 139; ca 8.6   Review of Systems  Constitutional: Negative for malaise/fatigue.  Respiratory: Negative for cough and shortness of breath.   Cardiovascular: Negative for chest pain, palpitations and leg swelling.  Gastrointestinal: Negative for abdominal pain, constipation and heartburn.  Musculoskeletal: Negative for back pain, joint pain and myalgias.  Skin: Negative.   Neurological: Negative for dizziness.  Psychiatric/Behavioral: The patient is not nervous/anxious.     Physical Exam Constitutional:      General: She is not in acute distress.    Appearance: She is well-developed. She is not diaphoretic.  Neck:     Thyroid: No thyromegaly.  Cardiovascular:     Rate and Rhythm: Normal rate and regular rhythm.     Pulses: Normal pulses.     Heart sounds: Normal heart sounds.  Pulmonary:     Effort: Pulmonary  effort is normal. No respiratory distress.     Breath sounds: Normal breath sounds.  Chest:     Comments: History of right partial mastectomy Abdominal:     General: Bowel sounds are normal. There is no distension.     Palpations: Abdomen is soft.     Tenderness: There is no abdominal tenderness.     Comments: History of right hemi colectomy  Bili drain site without signs of infection   Musculoskeletal:        General: Normal range of motion.     Cervical back: Neck supple.     Right lower leg: No edema.     Left lower leg: No edema.  Lymphadenopathy:     Cervical: No cervical adenopathy.  Skin:    General: Skin is warm and dry.  Neurological:     Mental Status: She is alert. Mental status is at baseline.  Psychiatric:        Mood and Affect: Mood normal.        ASSESSMENT/ PLAN:  TODAY;   1. Essential hypertension: is stable b/p 13569 will monitor  2. Chronic constipation: is stable will continue miralax daily as needed   3. Malignant neoplasm of right female breast unspecified estrogen receptor site of breast: history of right partial mastectomy will monitor  PREVIOUS  4. Calculus of gall bladder with acute cholecystitis and obstruction: bili tube is present; will monitor her status.   5. Dementia without behavioral disturbance unspecified dementia type: no significant change in status; wight is 146 ppounds will continue aricept 10 mg daily    MD is aware of resident's narcotic use and is in agreement with current plan of care. We will attempt to wean resident as appropriate.  Ok Edwards NP [facility]  Contact [phone] Monday through Friday 8am- 5pm  After hours call [phone]

## 2020-03-29 ENCOUNTER — Non-Acute Institutional Stay (SKILLED_NURSING_FACILITY): Payer: Medicare Other | Admitting: Adult Health

## 2020-03-29 ENCOUNTER — Encounter: Payer: Self-pay | Admitting: Adult Health

## 2020-03-29 DIAGNOSIS — F0391 Unspecified dementia with behavioral disturbance: Secondary | ICD-10-CM | POA: Diagnosis not present

## 2020-03-29 DIAGNOSIS — I10 Essential (primary) hypertension: Secondary | ICD-10-CM

## 2020-03-29 DIAGNOSIS — K8012 Calculus of gallbladder with acute and chronic cholecystitis without obstruction: Secondary | ICD-10-CM

## 2020-03-29 NOTE — Progress Notes (Signed)
Location:  [place] Room Number: 141-D Place of Service:  SNF (31)   CODE STATUS: FULL CODE  No Known Allergies  Chief Complaint  Patient presents with  . Medical Management of Chronic Issues          Calculus of gall bladder with acute cholecystitis and obstruction:    Dementia without behavioral disturbance unspecified dementia type:    Essential hypertension:    HPI:  She is a 86 year old long term resident of this facility being seen for the management of her chronic illnesses: dementia; hypertension; cholecystitis. There are no reports of nausea or vomiting. No reports of changes in appetite; no reports of uncontrolled pain.   Past Medical History:  Diagnosis Date  . Breast cancer ([place])   . Cognitive communication deficit   . Dementia ([place])   . Difficulty in walking(719.7)   . Fall   . HTN (hypertension) 08/26/2016  . Muscle weakness (generalized)   . Rash and nonspecific skin eruption   . Thyroid disease    hypo  . Urinary tract infection, site not specified   . Vitamin B deficiency     Past Surgical History:  Procedure Laterality Date  . COLON SURGERY    . IR CATHETER TUBE CHANGE  05/07/2017  . IR EXCHANGE BILIARY DRAIN  07/02/2017  . IR EXCHANGE BILIARY DRAIN  07/10/2017  . IR EXCHANGE BILIARY DRAIN  08/28/2017  . IR EXCHANGE BILIARY DRAIN  10/11/2017  . IR EXCHANGE BILIARY DRAIN  10/28/2017  . IR EXCHANGE BILIARY DRAIN  12/23/2017  . IR EXCHANGE BILIARY DRAIN  01/23/2018  . IR EXCHANGE BILIARY DRAIN  02/17/2018  . IR EXCHANGE BILIARY DRAIN  03/20/2018  . IR EXCHANGE BILIARY DRAIN  04/17/2018  . IR EXCHANGE BILIARY DRAIN  05/29/2018  . IR EXCHANGE BILIARY DRAIN  07/24/2018  . IR EXCHANGE BILIARY DRAIN  09/22/2018  . IR EXCHANGE BILIARY DRAIN  11/20/2018  . IR EXCHANGE BILIARY DRAIN  02/11/2019  . IR EXCHANGE BILIARY DRAIN  04/14/2019  . IR EXCHANGE BILIARY DRAIN  06/09/2019  . IR EXCHANGE BILIARY DRAIN  11/26/2019  . IR EXCHANGE BILIARY DRAIN   12/23/2019  . IR EXCHANGE BILIARY DRAIN  02/22/2020  . IR PERC CHOLECYSTOSTOMY  01/27/2017  . IR PERC CHOLECYSTOSTOMY  11/18/2019  . IR RADIOLOGIST EVAL & MGMT  03/05/2017  . MASTECTOMY, PARTIAL Right     Social History   Socioeconomic History  . Marital status: Widowed    Spouse name: Not on file  . Number of children: Not on file  . Years of education: Not on file  . Highest education level: Not on file  Occupational History  . Not on file  Tobacco Use  . Smoking status: Never Smoker  . Smokeless tobacco: Never Used  Substance and Sexual Activity  . Alcohol use: No  . Drug use: No  . Sexual activity: Not on file  Other Topics Concern  . Not on file  Social History Narrative  . Not on file   Social Determinants of Health   Financial Resource Strain:   . Difficulty of Paying Living Expenses:   Food Insecurity:   . Worried About Charity fundraiser in the Last Year:   . Arboriculturist in the Last Year:   Transportation Needs:   . Film/video editor (Medical):   Marland Kitchen Lack of Transportation (Non-Medical):   Physical Activity:   . Days of Exercise per Week:   .  Minutes of Exercise per Session:   Stress:   . Feeling of Stress :   Social Connections:   . Frequency of Communication with Friends and Family:   . Frequency of Social Gatherings with Friends and Family:   . Attends Religious Services:   . Active Member of Clubs or Organizations:   . Attends Archivist Meetings:   Marland Kitchen Marital Status:   Intimate Partner Violence:   . Fear of Current or Ex-Partner:   . Emotionally Abused:   Marland Kitchen Physically Abused:   . Sexually Abused:    Family History  Problem Relation Age of Onset  . Cancer Father        ? primary  . Diabetes Neg Hx   . Heart disease Neg Hx   . Stroke Neg Hx       VITAL SIGNS BP 128/80   Pulse 80   Temp (!) 97.3 F (36.3 C) (Oral)   Resp 16   Ht 5\' 3"  (1.6 m)   Wt 145 lb 9.6 oz (66 kg)   SpO2 93%   BMI 25.79 kg/m   Outpatient  Encounter Medications as of 03/29/2020  Medication Sig  . calcium carbonate (TUMS) 500 MG chewable tablet Chew 1 tablet by mouth daily.  . Cholecalciferol 1000 units tablet Take 1,000 Units by mouth daily. hypocalcemia  . Cyanocobalamin (VITAMIN B-12) 1000 MCG SUBL Place 1 tablet under the tongue at bedtime.  . donepezil (ARICEPT) 10 MG tablet Take 1 tablet by mouth at bedtime.  . NON FORMULARY NAS dieT  . Nutritional Supplements (ENSURE ENLIVE PO) Take 1.5 kcal by mouth 2 (two) times daily between meals.  . Nutritional Supplements (NUTRITIONAL SUPPLEMENT PO) Take 1 each by mouth at bedtime. Magic Cup  . polyethylene glycol (MIRALAX / GLYCOLAX) packet Take 17 g by mouth daily as needed for mild constipation or moderate constipation.   . sodium chloride irrigation 0.9 % irrigation Irrigate with 5 mLs as directed daily. Special Instructions:  flush cholecystostomy daily   No facility-administered encounter medications on file as of 03/29/2020.     SIGNIFICANT DIAGNOSTIC EXAMS   PREVIOUS;   08-26-19: ct of abdomen  1. Interval removal of the previously seen percutaneous cholecystostomy tube. Cholelithiasis with findings concerning for acute on chronic cholecystitis. Clinical correlation is recommended. Ultrasound may provide better evaluation of the gallbladder. 2. Diffuse colonic diverticulosis. No bowel obstruction. 3. Aortic Atherosclerosis   11-13-19: ct of abdomen and pelvis:  1. Persistently thickened gallbladder wall with numerous gallstones, including one at the level of the gallbladder neck. Surrounding pericholecystic fat stranding without a well-defined fluid collection or evidence of abscess. Findings are concerning for acute on chronic cholecystitis. Consider nuclear medicine hepatobiliary scan to evaluate patency of the cystic and common bile ducts. 2. Extensive colonic diverticulosis without evidence of diverticulitis.  11-16-19: chest x-ray: Very mild right basilar linear  atelectasis.   11-16-19: abdominal ultrasound: Acute cholecystitis.   11-18-19: chole tube placement: Successful ultrasound and fluoroscopic 10 French cholecystostomy   NO NEW EXAMS.    LABS REVIEWED: PREVIOUS:   06-24-19; wbc 6.0; hgb 12.5; hct 40.1; mcv 97.1; plt 255; glucose 96; bun 17; creat 0.87; k+ 4.2; na++ 136; ca 8.6; albumin 3.5; lipase 39 07-09-19: liver normal albumin 3.6 07-21-19: wbc 5.2; hgb 11.9; hct 38.5; mcv 97.0 plt 234; glucose 92; bun 19; creat 0.89; k+ 4.1; na++ 138; ca 8.6 08-26-19: wbc 11.8; hgb 11.3; hct 35.7; mcv 96.5 plt 227; glucose 97; bun 31; creat 1.02; k+  3.8; na++ 139; ca 8.6; liver normal albumin 2.8   11-16-19: wbc 10.3; hgb 12.1; hct 39.3 mcv 98.5 plt 233; glucose 122; bun 31; creat 1.05; k 3.7 na++ 138; ca 8.5 ast 103 alt 81; albumin 3.0 1-28--21: wbc 12.3; hgb 11.2; hct 35.2; mcv 95.4 plt 265; glucose 88; bun 20; creat 0.95 ;k+ 3.7; na++ 148; ca 8.5 liver normal albumin 2.5 mag 2.3 11-21-19: wbc 8.9; hgb 10.5; hct 33.2; mcv 96.5 plt 265; glucose 95; bun 12; creat 0.86; k+ 3.5; na++ 140; ca 8.1 mag 1.9  12-15-19: glucose 88; bun 14; creat 0.84; k+ 4.2; na++ 139; ca 8.6  NO NEW LABS.     Review of Systems  Constitutional: Negative for malaise/fatigue.  Respiratory: Negative for cough and shortness of breath.   Cardiovascular: Negative for chest pain, palpitations and leg swelling.  Gastrointestinal: Negative for abdominal pain, constipation and heartburn.  Musculoskeletal: Negative for back pain, joint pain and myalgias.  Skin: Negative.   Neurological: Negative for dizziness.  Psychiatric/Behavioral: The patient is not nervous/anxious.       Physical Exam Constitutional:      General: She is not in acute distress.    Appearance: She is well-developed. She is not diaphoretic.  Neck:     Thyroid: No thyromegaly.  Cardiovascular:     Rate and Rhythm: Normal rate and regular rhythm.     Heart sounds: Normal heart sounds.  Pulmonary:     Effort:  Pulmonary effort is normal. No respiratory distress.     Breath sounds: Normal breath sounds.  Chest:     Comments: History of right partial mastectomy Abdominal:     General: Bowel sounds are normal. There is no distension.     Palpations: Abdomen is soft.     Tenderness: There is no abdominal tenderness.     Comments: History of right hemi colectomy   Musculoskeletal:        General: Normal range of motion.     Right lower leg: No edema.     Left lower leg: No edema.  Lymphadenopathy:     Cervical: No cervical adenopathy.  Skin:    General: Skin is warm and dry.  Neurological:     Mental Status: She is alert. Mental status is at baseline.  Psychiatric:        Mood and Affect: Mood normal.       ASSESSMENT/ PLAN:  TODAY;   1. Calculus of gall bladder with acute cholecystitis and obstruction: is presently stable will monitor   2. Dementia without behavioral disturbance unspecified dementia type: no change in status: weight is 146 pounds will continue aricept 10 mg daily   3. Essential hypertension: is stable 128/80 will monitor   PREVIOUS  4. Chronic constipation: is stable will continue miralax daily as needed   5. Malignant neoplasm of right female breast unspecified estrogen receptor site of breast: history of right partial mastectomy will monitor       MD is aware of resident's narcotic use and is in agreement with current plan of care. We will attempt to wean resident as appropriate.  Ok Edwards NP [facility]  Contact [phone] Monday through Friday 8am- 5pm  After hours call [phone]

## 2020-04-11 DIAGNOSIS — L603 Nail dystrophy: Secondary | ICD-10-CM | POA: Diagnosis not present

## 2020-04-11 DIAGNOSIS — M79674 Pain in right toe(s): Secondary | ICD-10-CM | POA: Diagnosis not present

## 2020-04-11 DIAGNOSIS — B351 Tinea unguium: Secondary | ICD-10-CM | POA: Diagnosis not present

## 2020-04-11 DIAGNOSIS — M79675 Pain in left toe(s): Secondary | ICD-10-CM | POA: Diagnosis not present

## 2020-04-11 DIAGNOSIS — I739 Peripheral vascular disease, unspecified: Secondary | ICD-10-CM | POA: Diagnosis not present

## 2020-04-12 ENCOUNTER — Other Ambulatory Visit (HOSPITAL_COMMUNITY): Payer: Medicare Other

## 2020-05-02 ENCOUNTER — Non-Acute Institutional Stay (SKILLED_NURSING_FACILITY): Payer: Medicare Other | Admitting: Adult Health

## 2020-05-02 ENCOUNTER — Encounter: Payer: Self-pay | Admitting: Adult Health

## 2020-05-02 DIAGNOSIS — K5909 Other constipation: Secondary | ICD-10-CM

## 2020-05-02 DIAGNOSIS — C50911 Malignant neoplasm of unspecified site of right female breast: Secondary | ICD-10-CM | POA: Diagnosis not present

## 2020-05-02 DIAGNOSIS — I10 Essential (primary) hypertension: Secondary | ICD-10-CM

## 2020-05-02 NOTE — Progress Notes (Signed)
Location:    [place] Room Number: 141/D Place of Service:  SNF (31)   CODE STATUS: Full Code  No Known Allergies  Chief Complaint  Patient presents with  . Medical Management of Chronic Issues          Essential hypertension:   Chronic constipation:   Malignant neoplasm of right female breast unspecified estrogen receptor site of breast:    HPI:  She is a 86 year old long term resident of this facility being seen for the management of her chronic illnesses: Essential hypertension: Chronic constipation: Malignant neoplasm of right female breast unspecified estrogen receptor site of breast. There are no reports of uncontrolled pain. Her weight is stable; no reports of constipation; no reports of agitation or anxiety.   Past Medical History:  Diagnosis Date  . Breast cancer ([place])   . Cognitive communication deficit   . Dementia ([place])   . Difficulty in walking(719.7)   . Fall   . HTN (hypertension) 08/26/2016  . Muscle weakness (generalized)   . Rash and nonspecific skin eruption   . Thyroid disease    hypo  . Urinary tract infection, site not specified   . Vitamin B deficiency     Past Surgical History:  Procedure Laterality Date  . COLON SURGERY    . IR CATHETER TUBE CHANGE  05/07/2017  . IR EXCHANGE BILIARY DRAIN  07/02/2017  . IR EXCHANGE BILIARY DRAIN  07/10/2017  . IR EXCHANGE BILIARY DRAIN  08/28/2017  . IR EXCHANGE BILIARY DRAIN  10/11/2017  . IR EXCHANGE BILIARY DRAIN  10/28/2017  . IR EXCHANGE BILIARY DRAIN  12/23/2017  . IR EXCHANGE BILIARY DRAIN  01/23/2018  . IR EXCHANGE BILIARY DRAIN  02/17/2018  . IR EXCHANGE BILIARY DRAIN  03/20/2018  . IR EXCHANGE BILIARY DRAIN  04/17/2018  . IR EXCHANGE BILIARY DRAIN  05/29/2018  . IR EXCHANGE BILIARY DRAIN  07/24/2018  . IR EXCHANGE BILIARY DRAIN  09/22/2018  . IR EXCHANGE BILIARY DRAIN  11/20/2018  . IR EXCHANGE BILIARY DRAIN  02/11/2019  . IR EXCHANGE BILIARY DRAIN  04/14/2019  . IR EXCHANGE BILIARY DRAIN   06/09/2019  . IR EXCHANGE BILIARY DRAIN  11/26/2019  . IR EXCHANGE BILIARY DRAIN  12/23/2019  . IR EXCHANGE BILIARY DRAIN  02/22/2020  . IR PERC CHOLECYSTOSTOMY  01/27/2017  . IR PERC CHOLECYSTOSTOMY  11/18/2019  . IR RADIOLOGIST EVAL & MGMT  03/05/2017  . MASTECTOMY, PARTIAL Right     Social History   Socioeconomic History  . Marital status: Widowed    Spouse name: Not on file  . Number of children: Not on file  . Years of education: Not on file  . Highest education level: Not on file  Occupational History  . Not on file  Tobacco Use  . Smoking status: Never Smoker  . Smokeless tobacco: Never Used  Vaping Use  . Vaping Use: Never used  Substance and Sexual Activity  . Alcohol use: No  . Drug use: No  . Sexual activity: Not on file  Other Topics Concern  . Not on file  Social History Narrative  . Not on file   Social Determinants of Health   Financial Resource Strain:   . Difficulty of Paying Living Expenses:   Food Insecurity:   . Worried About Charity fundraiser in the Last Year:   . Arboriculturist in the Last Year:   Transportation Needs:   . Lack of Transportation (  Medical):   Marland Kitchen Lack of Transportation (Non-Medical):   Physical Activity:   . Days of Exercise per Week:   . Minutes of Exercise per Session:   Stress:   . Feeling of Stress :   Social Connections:   . Frequency of Communication with Friends and Family:   . Frequency of Social Gatherings with Friends and Family:   . Attends Religious Services:   . Active Member of Clubs or Organizations:   . Attends Archivist Meetings:   Marland Kitchen Marital Status:   Intimate Partner Violence:   . Fear of Current or Ex-Partner:   . Emotionally Abused:   Marland Kitchen Physically Abused:   . Sexually Abused:    Family History  Problem Relation Age of Onset  . Cancer Father        ? primary  . Diabetes Neg Hx   . Heart disease Neg Hx   . Stroke Neg Hx       VITAL SIGNS BP (!) 125/51   Pulse 68   Temp 97.7 F (36.5  C) (Oral)   Resp 18   Ht 5\' 3"  (1.6 m)   Wt 147 lb 12.8 oz (67 kg)   BMI 26.18 kg/m   Outpatient Encounter Medications as of 05/02/2020  Medication Sig  . calcium carbonate (TUMS) 500 MG chewable tablet Chew 1 tablet by mouth daily.  . Cholecalciferol 1000 units tablet Take 1,000 Units by mouth daily. hypocalcemia  . Cyanocobalamin (VITAMIN B-12) 1000 MCG SUBL Place 1 tablet under the tongue at bedtime.  . donepezil (ARICEPT) 10 MG tablet Take 1 tablet by mouth at bedtime.  . NON FORMULARY NAS dieT  . Nutritional Supplements (ENSURE ENLIVE PO) Take 1.5 kcal by mouth 2 (two) times daily between meals.  . Nutritional Supplements (NUTRITIONAL SUPPLEMENT PO) Take 1 each by mouth at bedtime. Magic Cup  . polyethylene glycol (MIRALAX / GLYCOLAX) packet Take 17 g by mouth daily as needed for mild constipation or moderate constipation.   . sodium chloride irrigation 0.9 % irrigation Irrigate with 5 mLs as directed daily. Special Instructions:  flush cholecystostomy daily   No facility-administered encounter medications on file as of 05/02/2020.     SIGNIFICANT DIAGNOSTIC EXAMS   PREVIOUS;   08-26-19: ct of abdomen  1. Interval removal of the previously seen percutaneous cholecystostomy tube. Cholelithiasis with findings concerning for acute on chronic cholecystitis. Clinical correlation is recommended. Ultrasound may provide better evaluation of the gallbladder. 2. Diffuse colonic diverticulosis. No bowel obstruction. 3. Aortic Atherosclerosis   11-13-19: ct of abdomen and pelvis:  1. Persistently thickened gallbladder wall with numerous gallstones, including one at the level of the gallbladder neck. Surrounding pericholecystic fat stranding without a well-defined fluid collection or evidence of abscess. Findings are concerning for acute on chronic cholecystitis. Consider nuclear medicine hepatobiliary scan to evaluate patency of the cystic and common bile ducts. 2. Extensive colonic  diverticulosis without evidence of diverticulitis.  11-16-19: chest x-ray: Very mild right basilar linear atelectasis.   11-16-19: abdominal ultrasound: Acute cholecystitis.   11-18-19: chole tube placement: Successful ultrasound and fluoroscopic 10 French cholecystostomy   NO NEW EXAMS.    LABS REVIEWED: PREVIOUS:   06-24-19; wbc 6.0; hgb 12.5; hct 40.1; mcv 97.1; plt 255; glucose 96; bun 17; creat 0.87; k+ 4.2; na++ 136; ca 8.6; albumin 3.5; lipase 39 07-09-19: liver normal albumin 3.6 07-21-19: wbc 5.2; hgb 11.9; hct 38.5; mcv 97.0 plt 234; glucose 92; bun 19; creat 0.89; k+ 4.1; na++ 138; ca  8.6 08-26-19: wbc 11.8; hgb 11.3; hct 35.7; mcv 96.5 plt 227; glucose 97; bun 31; creat 1.02; k+ 3.8; na++ 139; ca 8.6; liver normal albumin 2.8   11-16-19: wbc 10.3; hgb 12.1; hct 39.3 mcv 98.5 plt 233; glucose 122; bun 31; creat 1.05; k 3.7 na++ 138; ca 8.5 ast 103 alt 81; albumin 3.0 1-28--21: wbc 12.3; hgb 11.2; hct 35.2; mcv 95.4 plt 265; glucose 88; bun 20; creat 0.95 ;k+ 3.7; na++ 148; ca 8.5 liver normal albumin 2.5 mag 2.3 11-21-19: wbc 8.9; hgb 10.5; hct 33.2; mcv 96.5 plt 265; glucose 95; bun 12; creat 0.86; k+ 3.5; na++ 140; ca 8.1 mag 1.9  12-15-19: glucose 88; bun 14; creat 0.84; k+ 4.2; na++ 139; ca 8.6  NO NEW LABS.    Review of Systems  Constitutional: Negative for malaise/fatigue.  Respiratory: Negative for cough and shortness of breath.   Cardiovascular: Negative for chest pain, palpitations and leg swelling.  Gastrointestinal: Negative for abdominal pain, constipation and heartburn.  Musculoskeletal: Negative for back pain, joint pain and myalgias.  Skin: Negative.   Neurological: Negative for dizziness.  Psychiatric/Behavioral: The patient is not nervous/anxious.    Physical Exam Constitutional:      General: She is not in acute distress.    Appearance: She is well-developed. She is not diaphoretic.  Neck:     Thyroid: No thyromegaly.  Cardiovascular:     Rate and Rhythm:  Normal rate and regular rhythm.     Pulses: Normal pulses.     Heart sounds: Normal heart sounds.  Pulmonary:     Effort: Pulmonary effort is normal. No respiratory distress.     Breath sounds: Normal breath sounds.  Chest:     Comments: History of right partial mastectomy Abdominal:     General: Bowel sounds are normal. There is no distension.     Palpations: Abdomen is soft.     Tenderness: There is no abdominal tenderness.     Comments: History of right hemi colectomy   Chole drain  Musculoskeletal:        General: Normal range of motion.     Cervical back: Neck supple.     Right lower leg: No edema.     Left lower leg: No edema.  Lymphadenopathy:     Cervical: No cervical adenopathy.  Skin:    General: Skin is warm and dry.  Neurological:     Mental Status: She is alert. Mental status is at baseline.  Psychiatric:        Mood and Affect: Mood normal.     ASSESSMENT/ PLAN:  TODAY;   1. Essential hypertension: is stable 122/51 will monitor   2. Chronic constipation: is stable will continue miralax daily as needed  3. Malignant neoplasm of right female breast unspecified estrogen receptor site of breast: history of partial mastectomy will monitor   PREVIOUS  4. Calculus of gall bladder with acute cholecystitis and obstruction: is presently stable will monitor   5. Dementia without behavioral disturbance unspecified dementia type: no change in status: weight is 147 pounds will continue aricept 10 mg daily           MD is aware of resident's narcotic use and is in agreement with current plan of care. We will attempt to wean resident as appropriate.  Ok Edwards NP [facility]  Contact [phone] Monday through Friday 8am- 5pm  After hours call [phone]

## 2020-05-05 ENCOUNTER — Other Ambulatory Visit (HOSPITAL_COMMUNITY)
Admission: RE | Admit: 2020-05-05 | Discharge: 2020-05-05 | Disposition: A | Discharge status: Home / Self Care | Payer: Medicare Other | Source: Skilled Nursing Facility | Attending: Adult Health | Admitting: Adult Health

## 2020-05-05 DIAGNOSIS — K8012 Calculus of gallbladder with acute and chronic cholecystitis without obstruction: Secondary | ICD-10-CM | POA: Diagnosis not present

## 2020-05-05 LAB — CBC
HCT: 40.5 % (ref 36.0–46.0)
Hemoglobin: 13 g/dL (ref 12.0–15.0)
MCH: 31 pg (ref 26.0–34.0)
MCHC: 32.1 g/dL (ref 30.0–36.0)
MCV: 96.4 fL (ref 80.0–100.0)
Platelets: 253 10*3/uL (ref 150–400)
RBC: 4.2 MIL/uL (ref 3.87–5.11)
RDW: 15.4 % (ref 11.5–15.5)
WBC: 5.4 10*3/uL (ref 4.0–10.5)
nRBC: 0 % (ref 0.0–0.2)

## 2020-05-05 LAB — COMPREHENSIVE METABOLIC PANEL
ALT: 20 U/L (ref 0–44)
AST: 17 U/L (ref 15–41)
Albumin: 3.6 g/dL (ref 3.5–5.0)
Alkaline Phosphatase: 78 U/L (ref 38–126)
Anion gap: 6 (ref 5–15)
BUN: 16 mg/dL (ref 8–23)
CO2: 28 mmol/L (ref 22–32)
Calcium: 8.9 mg/dL (ref 8.9–10.3)
Chloride: 103 mmol/L (ref 98–111)
Creatinine, Ser: 0.9 mg/dL (ref 0.44–1.00)
GFR calc Af Amer: >60 mL/min (ref >60)
GFR calc non Af Amer: 56 mL/min — ABNORMAL LOW (ref >60)
Glucose, Bld: 86 mg/dL (ref 70–99)
Potassium: 4 mmol/L (ref 3.5–5.1)
Sodium: 137 mmol/L (ref 135–145)
Total Bilirubin: 0.7 mg/dL (ref 0.3–1.2)
Total Protein: 7.2 g/dL (ref 6.5–8.1)

## 2020-05-25 ENCOUNTER — Inpatient Hospital Stay (HOSPITAL_COMMUNITY)
Admission: RE | Admit: 2020-05-25 | Discharge: 2020-05-25 | Disposition: A | Discharge status: Home / Self Care | Payer: Medicare Other | Source: Ambulatory Visit | Attending: Interventional Radiology | Admitting: Interventional Radiology

## 2020-05-25 DIAGNOSIS — Z434 Encounter for attention to other artificial openings of digestive tract: Secondary | ICD-10-CM | POA: Diagnosis not present

## 2020-05-25 HISTORY — PX: IR EXCHANGE BILIARY DRAIN: IMG6046

## 2020-05-25 MED ORDER — LIDOCAINE HCL 1 % IJ SOLN
INTRAMUSCULAR | Status: AC
  Filled 2020-05-25: qty 20

## 2020-05-25 MED ORDER — IOHEXOL 300 MG/ML  SOLN
50.0000 mL | Freq: Once | INTRAMUSCULAR | Status: AC | PRN
  Administered 2020-05-25: 10 mL

## 2020-05-25 MED ORDER — LIDOCAINE HCL 1 % IJ SOLN
INTRAMUSCULAR | Status: DC | PRN
  Administered 2020-05-25: 10 mL

## 2020-05-25 NOTE — Procedures (Signed)
Interventional Radiology Procedure Note  Procedure: Routine exchange of perc chole tube, 50F drain.    Complications: None  Recommendations:  - To gravity - Do not submerge - Routine care   Signed,  Dulcy Fanny. Earleen Newport, DO

## 2020-06-03 ENCOUNTER — Encounter: Payer: Self-pay | Admitting: Adult Health

## 2020-06-03 ENCOUNTER — Non-Acute Institutional Stay (SKILLED_NURSING_FACILITY): Payer: Medicare Other | Admitting: Adult Health

## 2020-06-03 DIAGNOSIS — I10 Essential (primary) hypertension: Secondary | ICD-10-CM | POA: Diagnosis not present

## 2020-06-03 DIAGNOSIS — K8012 Calculus of gallbladder with acute and chronic cholecystitis without obstruction: Secondary | ICD-10-CM | POA: Diagnosis not present

## 2020-06-03 DIAGNOSIS — F0391 Unspecified dementia with behavioral disturbance: Secondary | ICD-10-CM

## 2020-06-03 NOTE — Progress Notes (Signed)
Location:    [place] Room Number: 141/D Place of Service:  SNF (31)   CODE STATUS: Full Code  No Known Allergies  Chief Complaint  Patient presents with  . Medical Management of Chronic Issues           Calculus of gall bladder with acute cholecystitis and obstruction  Dementia without behavioral disturbance unspecified dementia type  Essential hypertension:    HPI:  She is a 86 year old long term resident of this facility being seen for the management of her chronic illnesses: Calculus of gall bladder with acute cholecystitis and obstruction    Dementia without behavioral disturbance unspecified dementia type  Essential hypertension. There are no reports of uncontrolled pain; no nausea or vomiting. No reports of constipation. No reports of agitation or anxiety.   Past Medical History:  Diagnosis Date  . Breast cancer ([place])   . Cognitive communication deficit   . Dementia ([place])   . Difficulty in walking(719.7)   . Fall   . HTN (hypertension) 08/26/2016  . Muscle weakness (generalized)   . Rash and nonspecific skin eruption   . Thyroid disease    hypo  . Urinary tract infection, site not specified   . Vitamin B deficiency     Past Surgical History:  Procedure Laterality Date  . COLON SURGERY    . IR CATHETER TUBE CHANGE  05/07/2017  . IR EXCHANGE BILIARY DRAIN  07/02/2017  . IR EXCHANGE BILIARY DRAIN  07/10/2017  . IR EXCHANGE BILIARY DRAIN  08/28/2017  . IR EXCHANGE BILIARY DRAIN  10/11/2017  . IR EXCHANGE BILIARY DRAIN  10/28/2017  . IR EXCHANGE BILIARY DRAIN  12/23/2017  . IR EXCHANGE BILIARY DRAIN  01/23/2018  . IR EXCHANGE BILIARY DRAIN  02/17/2018  . IR EXCHANGE BILIARY DRAIN  03/20/2018  . IR EXCHANGE BILIARY DRAIN  04/17/2018  . IR EXCHANGE BILIARY DRAIN  05/29/2018  . IR EXCHANGE BILIARY DRAIN  07/24/2018  . IR EXCHANGE BILIARY DRAIN  09/22/2018  . IR EXCHANGE BILIARY DRAIN  11/20/2018  . IR EXCHANGE BILIARY DRAIN  02/11/2019  . IR EXCHANGE BILIARY  DRAIN  04/14/2019  . IR EXCHANGE BILIARY DRAIN  06/09/2019  . IR EXCHANGE BILIARY DRAIN  11/26/2019  . IR EXCHANGE BILIARY DRAIN  12/23/2019  . IR EXCHANGE BILIARY DRAIN  02/22/2020  . IR EXCHANGE BILIARY DRAIN  05/25/2020  . IR PERC CHOLECYSTOSTOMY  01/27/2017  . IR PERC CHOLECYSTOSTOMY  11/18/2019  . IR RADIOLOGIST EVAL & MGMT  03/05/2017  . MASTECTOMY, PARTIAL Right     Social History   Socioeconomic History  . Marital status: Widowed    Spouse name: Not on file  . Number of children: Not on file  . Years of education: Not on file  . Highest education level: Not on file  Occupational History  . Not on file  Tobacco Use  . Smoking status: Never Smoker  . Smokeless tobacco: Never Used  Vaping Use  . Vaping Use: Never used  Substance and Sexual Activity  . Alcohol use: No  . Drug use: No  . Sexual activity: Not on file  Other Topics Concern  . Not on file  Social History Narrative  . Not on file   Social Determinants of Health   Financial Resource Strain:   . Difficulty of Paying Living Expenses:   Food Insecurity:   . Worried About Charity fundraiser in the Last Year:   . [employer] of  Food in the Last Year:   Transportation Needs:   . Film/video editor (Medical):   Marland Kitchen Lack of Transportation (Non-Medical):   Physical Activity:   . Days of Exercise per Week:   . Minutes of Exercise per Session:   Stress:   . Feeling of Stress :   Social Connections:   . Frequency of Communication with Friends and Family:   . Frequency of Social Gatherings with Friends and Family:   . Attends Religious Services:   . Active Member of Clubs or Organizations:   . Attends Archivist Meetings:   Marland Kitchen Marital Status:   Intimate Partner Violence:   . Fear of Current or Ex-Partner:   . Emotionally Abused:   Marland Kitchen Physically Abused:   . Sexually Abused:    Family History  Problem Relation Age of Onset  . Cancer Father        ? primary  . Diabetes Neg Hx   . Heart disease Neg Hx   .  Stroke Neg Hx       VITAL SIGNS BP (!) 127/58   Pulse 85   Temp (!) 97.5 F (36.4 C) (Oral)   Resp 16   Ht 5\' 3"  (1.6 m)   Wt 146 lb 9.6 oz (66.5 kg)   BMI 25.97 kg/m   Outpatient Encounter Medications as of 06/03/2020  Medication Sig  . calcium carbonate (TUMS) 500 MG chewable tablet Chew 1 tablet by mouth daily.  . Cholecalciferol 1000 units tablet Take 1,000 Units by mouth daily. hypocalcemia  . Cyanocobalamin (VITAMIN B-12) 1000 MCG SUBL Place 1 tablet under the tongue at bedtime.  . donepezil (ARICEPT) 10 MG tablet Take 1 tablet by mouth at bedtime.  . NON FORMULARY NAS dieT  . Nutritional Supplements (ENSURE ENLIVE PO) Take 1.5 kcal by mouth 2 (two) times daily between meals.  . Nutritional Supplements (NUTRITIONAL SUPPLEMENT PO) Take 1 each by mouth at bedtime. Magic Cup  . polyethylene glycol (MIRALAX / GLYCOLAX) packet Take 17 g by mouth daily as needed for mild constipation or moderate constipation.   . sodium chloride irrigation 0.9 % irrigation Irrigate with 5 mLs as directed daily. Special Instructions: flush cholecystostomy daily   No facility-administered encounter medications on file as of 06/03/2020.     SIGNIFICANT DIAGNOSTIC EXAMS  PREVIOUS;   08-26-19: ct of abdomen  1. Interval removal of the previously seen percutaneous cholecystostomy tube. Cholelithiasis with findings concerning for acute on chronic cholecystitis. Clinical correlation is recommended. Ultrasound may provide better evaluation of the gallbladder. 2. Diffuse colonic diverticulosis. No bowel obstruction. 3. Aortic Atherosclerosis   11-13-19: ct of abdomen and pelvis:  1. Persistently thickened gallbladder wall with numerous gallstones, including one at the level of the gallbladder neck. Surrounding pericholecystic fat stranding without a well-defined fluid collection or evidence of abscess. Findings are concerning for acute on chronic cholecystitis. Consider nuclear medicine hepatobiliary  scan to evaluate patency of the cystic and common bile ducts. 2. Extensive colonic diverticulosis without evidence of diverticulitis.  11-16-19: chest x-ray: Very mild right basilar linear atelectasis.   11-16-19: abdominal ultrasound: Acute cholecystitis.   11-18-19: chole tube placement: Successful ultrasound and fluoroscopic 10 French cholecystostomy   NO NEW EXAMS.    LABS REVIEWED: PREVIOUS:   06-24-19; wbc 6.0; hgb 12.5; hct 40.1; mcv 97.1; plt 255; glucose 96; bun 17; creat 0.87; k+ 4.2; na++ 136; ca 8.6; albumin 3.5; lipase 39 07-09-19: liver normal albumin 3.6 07-21-19: wbc 5.2; hgb 11.9; hct 38.5; mcv  97.0 plt 234; glucose 92; bun 19; creat 0.89; k+ 4.1; na++ 138; ca 8.6 08-26-19: wbc 11.8; hgb 11.3; hct 35.7; mcv 96.5 plt 227; glucose 97; bun 31; creat 1.02; k+ 3.8; na++ 139; ca 8.6; liver normal albumin 2.8   11-16-19: wbc 10.3; hgb 12.1; hct 39.3 mcv 98.5 plt 233; glucose 122; bun 31; creat 1.05; k 3.7 na++ 138; ca 8.5 ast 103 alt 81; albumin 3.0 1-28--21: wbc 12.3; hgb 11.2; hct 35.2; mcv 95.4 plt 265; glucose 88; bun 20; creat 0.95 ;k+ 3.7; na++ 148; ca 8.5 liver normal albumin 2.5 mag 2.3 11-21-19: wbc 8.9; hgb 10.5; hct 33.2; mcv 96.5 plt 265; glucose 95; bun 12; creat 0.86; k+ 3.5; na++ 140; ca 8.1 mag 1.9  12-15-19: glucose 88; bun 14; creat 0.84; k+ 4.2; na++ 139; ca 8.6  TODAY  05-05-20: wbc 5.0; hgb 13.0; hct 40.5 mcv 96.4 plt 253; glucose 86; bun 16; creat 0.90; k+ 4.0; na++ 137; ca 8.9 liver normal albumin 3.6   Review of Systems  Constitutional: Negative for malaise/fatigue.  Respiratory: Negative for cough and shortness of breath.   Cardiovascular: Negative for chest pain, palpitations and leg swelling.  Gastrointestinal: Negative for abdominal pain, constipation and heartburn.  Musculoskeletal: Negative for back pain, joint pain and myalgias.  Skin: Negative.   Neurological: Negative for dizziness.  Psychiatric/Behavioral: The patient is not nervous/anxious.         Physical Exam Constitutional:      General: She is not in acute distress.    Appearance: She is well-developed. She is not diaphoretic.  Neck:     Thyroid: No thyromegaly.  Cardiovascular:     Rate and Rhythm: Normal rate and regular rhythm.     Pulses: Normal pulses.     Heart sounds: Normal heart sounds.  Pulmonary:     Effort: Pulmonary effort is normal. No respiratory distress.     Breath sounds: Normal breath sounds.  Chest:     Comments: History of right partial mastectomy Abdominal:     General: Bowel sounds are normal. There is no distension.     Palpations: Abdomen is soft.     Tenderness: There is no abdominal tenderness.     Comments:  History of right hemi colectomy   Chole drain   Musculoskeletal:        General: Normal range of motion.     Cervical back: Neck supple.     Right lower leg: No edema.     Left lower leg: No edema.  Lymphadenopathy:     Cervical: No cervical adenopathy.  Skin:    General: Skin is warm and dry.  Neurological:     Mental Status: She is alert. Mental status is at baseline.  Psychiatric:        Mood and Affect: Mood normal.       ASSESSMENT/ PLAN:  TODAY;   1. Calculus of gall bladder with acute cholecystitis and obstruction has chole drain last changed 05-25-20 will monitor   2. Dementia without behavioral disturbance unspecified dementia type is without change weight is 146 pounds will monitor   3. Essential hypertension: is stable b/p 127/58 stable will monitor   PREVIOUS  4. Chronic constipation: is stable will continue miralax daily as needed  5. Malignant neoplasm of right female breast unspecified estrogen receptor site of breast: history of partial mastectomy will monitor    MD is aware of resident's narcotic use and is in agreement with current plan of care. We will  attempt to wean resident as appropriate.  Ok Edwards NP [facility] Medicine  Contact [phone] Monday through Friday 8am- 5pm  After  hours call [phone]

## 2020-06-06 ENCOUNTER — Encounter: Payer: Self-pay | Admitting: Adult Health

## 2020-06-06 ENCOUNTER — Non-Acute Institutional Stay (SKILLED_NURSING_FACILITY): Payer: Medicare Other | Admitting: Adult Health

## 2020-06-06 DIAGNOSIS — Z0000 Encounter for general adult medical examination without abnormal findings: Secondary | ICD-10-CM | POA: Diagnosis not present

## 2020-06-06 NOTE — Patient Instructions (Signed)
  Ms. Asch , Thank you for taking time to come for your Medicare Wellness Visit. I appreciate your ongoing commitment to your health goals. Please review the following plan we discussed and let me know if I can assist you in the future.   These are the goals we discussed: Goals    . DIET - INCREASE WATER INTAKE    . Follow up with Provider as scheduled    . General - Client will not be readmitted within 30 days (C-[facility])       This is a list of the screening recommended for you and due dates:  Health Maintenance  Topic Date Due  . Flu Shot  05/22/2020  . Tetanus Vaccine  07/12/2027  . DEXA scan (bone density measurement)  Completed  . COVID-19 Vaccine  Completed  . Pneumonia vaccines  Completed

## 2020-06-06 NOTE — Progress Notes (Signed)
Subjective:   IRETA PULLMAN is a 86 y.o. female who presents for Medicare Annual (Subsequent) preventive examination. Long term resident of [facility]   Review of Systems  Constitutional: Negative for malaise/fatigue.  Respiratory: Negative for cough and shortness of breath.   Cardiovascular: Negative for chest pain, palpitations and leg swelling.  Gastrointestinal: Negative for abdominal pain, constipation and heartburn.  Musculoskeletal: Negative for back pain, joint pain and myalgias.  Skin: Negative.   Neurological: Negative for dizziness.  Psychiatric/Behavioral: The patient is not nervous/anxious.     Cardiac Risk Factors include: advanced age (>58men, >24 women);hypertension;sedentary lifestyle     Objective:    Today's Vitals   06/06/20 0929  BP: 131/71  Pulse: 70  Resp: 16  Temp: (!) 97.4 F (36.3 C)  TempSrc: Oral  Weight: 146 lb 9.6 oz (66.5 kg)  Height: 5\' 3"  (1.6 m)   Body mass index is 25.97 kg/m.  Advanced Directives 06/06/2020 06/03/2020 05/02/2020 03/02/2020 02/11/2020 12/30/2019 12/21/2019  Does Patient Have a Medical Advance Directive? Yes Yes Yes Yes Yes Yes Yes  Type of Advance Directive (No Data) (No Data) (No Data) (No Data) (No Data) (No Data) -  Does patient want to make changes to medical advance directive? No - Patient declined No - Patient declined No - Patient declined No - Patient declined No - Patient declined No - Patient declined -  Copy of [place] in [place]  Would patient like information on creating a medical advance directive? - - - - - - -    Current Medications (verified) Outpatient Encounter Medications as of 06/06/2020  Medication Sig  . calcium carbonate (TUMS) 500 MG chewable tablet Chew 1 tablet by mouth daily.  . Cholecalciferol 1000 units tablet Take 1,000 Units by mouth daily. hypocalcemia  . Cyanocobalamin (VITAMIN B-12) 1000 MCG SUBL Place 1 tablet under the tongue at bedtime.  . donepezil  (ARICEPT) 10 MG tablet Take 1 tablet by mouth at bedtime.  . NON FORMULARY NAS dieT  . Nutritional Supplements (ENSURE ENLIVE PO) Take 1.5 kcal by mouth 2 (two) times daily between meals.  . Nutritional Supplements (NUTRITIONAL SUPPLEMENT PO) Take 1 each by mouth at bedtime. Magic Cup  . polyethylene glycol (MIRALAX / GLYCOLAX) packet Take 17 g by mouth daily as needed for mild constipation or moderate constipation.   . sodium chloride irrigation 0.9 % irrigation Irrigate with 5 mLs as directed daily. Special Instructions: flush cholecystostomy daily   No facility-administered encounter medications on file as of 06/06/2020.    Allergies (verified) Patient has no known allergies.   History: Past Medical History:  Diagnosis Date  . Breast cancer ([place])   . Cognitive communication deficit   . Dementia ([place])   . Difficulty in walking(719.7)   . Fall   . HTN (hypertension) 08/26/2016  . Muscle weakness (generalized)   . Rash and nonspecific skin eruption   . Thyroid disease    hypo  . Urinary tract infection, site not specified   . Vitamin B deficiency    Past Surgical History:  Procedure Laterality Date  . COLON SURGERY    . IR CATHETER TUBE CHANGE  05/07/2017  . IR EXCHANGE BILIARY DRAIN  07/02/2017  . IR EXCHANGE BILIARY DRAIN  07/10/2017  . IR EXCHANGE BILIARY DRAIN  08/28/2017  . IR EXCHANGE BILIARY DRAIN  10/11/2017  . IR EXCHANGE BILIARY DRAIN  10/28/2017  . IR EXCHANGE BILIARY DRAIN  12/23/2017  .  IR EXCHANGE BILIARY DRAIN  01/23/2018  . IR EXCHANGE BILIARY DRAIN  02/17/2018  . IR EXCHANGE BILIARY DRAIN  03/20/2018  . IR EXCHANGE BILIARY DRAIN  04/17/2018  . IR EXCHANGE BILIARY DRAIN  05/29/2018  . IR EXCHANGE BILIARY DRAIN  07/24/2018  . IR EXCHANGE BILIARY DRAIN  09/22/2018  . IR EXCHANGE BILIARY DRAIN  11/20/2018  . IR EXCHANGE BILIARY DRAIN  02/11/2019  . IR EXCHANGE BILIARY DRAIN  04/14/2019  . IR EXCHANGE BILIARY DRAIN  06/09/2019  . IR EXCHANGE BILIARY DRAIN  11/26/2019  . IR  EXCHANGE BILIARY DRAIN  12/23/2019  . IR EXCHANGE BILIARY DRAIN  02/22/2020  . IR EXCHANGE BILIARY DRAIN  05/25/2020  . IR PERC CHOLECYSTOSTOMY  01/27/2017  . IR PERC CHOLECYSTOSTOMY  11/18/2019  . IR RADIOLOGIST EVAL & MGMT  03/05/2017  . MASTECTOMY, PARTIAL Right    Family History  Problem Relation Age of Onset  . Cancer Father        ? primary  . Diabetes Neg Hx   . Heart disease Neg Hx   . Stroke Neg Hx    Social History   Socioeconomic History  . Marital status: Widowed    Spouse name: Not on file  . Number of children: Not on file  . Years of education: Not on file  . Highest education level: Not on file  Occupational History  . Not on file  Tobacco Use  . Smoking status: Never Smoker  . Smokeless tobacco: Never Used  Vaping Use  . Vaping Use: Never used  Substance and Sexual Activity  . Alcohol use: No  . Drug use: No  . Sexual activity: Not on file  Other Topics Concern  . Not on file  Social History Narrative  . Not on file   Social Determinants of Health   Financial Resource Strain:   . Difficulty of Paying Living Expenses:   Food Insecurity:   . Worried About Charity fundraiser in the Last Year:   . Arboriculturist in the Last Year:   Transportation Needs:   . Film/video editor (Medical):   Marland Kitchen Lack of Transportation (Non-Medical):   Physical Activity:   . Days of Exercise per Week:   . Minutes of Exercise per Session:   Stress:   . Feeling of Stress :   Social Connections:   . Frequency of Communication with Friends and Family:   . Frequency of Social Gatherings with Friends and Family:   . Attends Religious Services:   . Active Member of Clubs or Organizations:   . Attends Archivist Meetings:   Marland Kitchen Marital Status:     Tobacco Counseling Counseling given: Not Answered   Clinical Intake:  Pre-visit preparation completed: Yes  Pain : No/denies pain     BMI - recorded: 25.97 Nutritional Status: BMI 25 -29 Overweight Nutritional  Risks: None Diabetes: No     Diabetic  no  Interpreter Needed?: No      Activities of Daily Living In your present state of health, do you have any difficulty performing the following activities: 06/06/2020 11/16/2019  Hearing? N N  Vision? N N  Difficulty concentrating or making decisions? Tempie Donning  Walking or climbing stairs? Y Y  Dressing or bathing? Y Y  Doing errands, shopping? Tempie Donning  Preparing Food and eating ? Y -  Using the Toilet? Y -  In the past six months, have you accidently leaked urine? Y -  Do  you have problems with loss of bowel control? Y -  Managing your Medications? Y -  Managing your Finances? Y -  Housekeeping or managing your Housekeeping? Y -  Some recent data might be hidden    Patient Care Team: [name], NP as PCP - General ([place], [place] ([place])      Assessment:   This is a routine wellness examination for [place].  Hearing/Vision screen No exam data present  Dietary issues and exercise activities discussed: Current Exercise Habits: The patient does not participate in regular exercise at present, Exercise limited by: None identified  Goals    . DIET - INCREASE WATER INTAKE    . Follow up with Provider as scheduled    . General - Client will not be readmitted within 30 days ([facility])      Depression Screen PHQ 2/9 Scores 06/06/2020 09/29/2019 07/21/2018 07/11/2017  PHQ - 2 Score 0 0 0 2  PHQ- 9 Score - - - 3    Fall Risk Fall Risk  06/06/2020 09/29/2019 07/21/2018 07/11/2017  Falls in the past year? 1 0 No No  Number falls in past yr: 0 0 - -  Injury with Fall? 0 0 - -  Risk for fall due to : History of fall(s);Impaired balance/gait;Impaired mobility - - -  Follow up Falls evaluation completed - - -    Any stairs in or around the home? no If so, are there any without handrails? N/a Home free of loose throw rugs in walkways, pet beds, electrical cords, etc? yes Adequate lighting in your  home to reduce risk of falls? yes  ASSISTIVE DEVICES UTILIZED TO PREVENT FALLS:  Life alert? n/a Use of a cane, walker or w/c? Wheelchair  Grab bars in the bathroom? yes Shower chair or bench in shower? Yes  Elevated toilet seat or a handicapped toilet? Yes   TIMED UP AND GO:  Was the test performed? n/a  Cognitive Function:     6CIT Screen 06/06/2020 07/21/2018 07/11/2017  What Year? 0 points 4 points 4 points  What month? 3 points 0 points 3 points  What time? 3 points 0 points 0 points  Count back from 20 4 points 0 points 0 points  Months in reverse 4 points 4 points 0 points  Repeat phrase 8 points 6 points 10 points  Total Score 22 14 17     Immunizations Immunization History  Administered Date(s) Administered  . Influenza-Unspecified 07/28/2014, 07/26/2017, 07/24/2018, 07/27/2019  . Moderna SARS-COVID-2 Vaccination 10/28/2019, 02/17/2020  . Pneumococcal Conjugate-13 06/21/2017  . Pneumococcal-Unspecified 08/01/2016  . Tdap 07/11/2017     Qualifies for Shingles Vaccine? n/a Zostavax completed no  Screening Tests Health Maintenance  Topic Date Due  . INFLUENZA VACCINE  05/22/2020  . TETANUS/TDAP  07/12/2027  . DEXA SCAN  Completed  . COVID-19 Vaccine  Completed  . PNA vac Low Risk Adult  Completed    Health Maintenance  Health Maintenance Due  Topic Date Due  . INFLUENZA VACCINE  05/22/2020    Lung Cancer Screening: (Low Dose CT Chest recommended if Age 60-80 years, 30 pack-year currently smoking OR have quit w/in 15years.) does not  qualify.   Lung Cancer Screening Referral:n/a Additional Screening:  Hepatitis C Screening: n/a   Vision Screening: Recommended annual ophthalmology exams for early detection of glaucoma and other disorders of the eye. Is the patient up to date with their annual eye exam?  yes   Dental Screening: Recommended annual  dental exams for proper oral hygiene  Community Resource Referral / Chronic Care Management: CRR  required this visit?  no   CCM required this visit? no     Plan:     I have personally reviewed and noted the following in the patient's chart:   . Medical and social history . Use of alcohol, tobacco or illicit drugs  . Current medications and supplements . Functional ability and status . Nutritional status . Physical activity . Advanced directives . List of other physicians . Hospitalizations, surgeries, and ER visits in previous 12 months . Vitals . Screenings to include cognitive, depression, and falls . Referrals and appointments  In addition, I have reviewed and discussed with patient certain preventive protocols, quality metrics, and best practice recommendations. A written personalized care plan for preventive services as well as general preventive health recommendations were provided to patient.     [name], NP   06/06/2020

## 2020-06-10 DIAGNOSIS — Z1159 Encounter for screening for other viral diseases: Secondary | ICD-10-CM | POA: Diagnosis not present

## 2020-06-13 DIAGNOSIS — Z1159 Encounter for screening for other viral diseases: Secondary | ICD-10-CM | POA: Diagnosis not present

## 2020-06-20 DIAGNOSIS — Z1159 Encounter for screening for other viral diseases: Secondary | ICD-10-CM | POA: Diagnosis not present

## 2020-06-23 DIAGNOSIS — Z1159 Encounter for screening for other viral diseases: Secondary | ICD-10-CM | POA: Diagnosis not present

## 2020-06-28 DIAGNOSIS — Z1159 Encounter for screening for other viral diseases: Secondary | ICD-10-CM | POA: Diagnosis not present

## 2020-06-30 DIAGNOSIS — Z1159 Encounter for screening for other viral diseases: Secondary | ICD-10-CM | POA: Diagnosis not present

## 2020-07-04 DIAGNOSIS — Z1159 Encounter for screening for other viral diseases: Secondary | ICD-10-CM | POA: Diagnosis not present

## 2020-07-07 DIAGNOSIS — Z1159 Encounter for screening for other viral diseases: Secondary | ICD-10-CM | POA: Diagnosis not present

## 2020-07-11 ENCOUNTER — Non-Acute Institutional Stay (SKILLED_NURSING_FACILITY): Payer: Medicare Other | Admitting: Adult Health

## 2020-07-11 ENCOUNTER — Encounter: Payer: Self-pay | Admitting: Adult Health

## 2020-07-11 DIAGNOSIS — C50911 Malignant neoplasm of unspecified site of right female breast: Secondary | ICD-10-CM | POA: Diagnosis not present

## 2020-07-11 DIAGNOSIS — Z1159 Encounter for screening for other viral diseases: Secondary | ICD-10-CM | POA: Diagnosis not present

## 2020-07-11 DIAGNOSIS — I10 Essential (primary) hypertension: Secondary | ICD-10-CM

## 2020-07-11 DIAGNOSIS — K5909 Other constipation: Secondary | ICD-10-CM | POA: Diagnosis not present

## 2020-07-11 NOTE — Progress Notes (Signed)
Location:    [facility] Room Number: 141/D Place of Service:  SNF (31)   CODE STATUS: Full Code  No Known Allergies  Chief Complaint  Patient presents with  . Medical Management of Chronic Issues          Essential hypertension:    Chronic constipation:  Malignant neoplasm of right female breast unspecified estrogen receptor site of breast     HPI:  She is a 86 year old long term resident of this facility being seen for the management of her chronic illnesses: Essential hypertension:    Chronic constipation:  Malignant neoplasm of right female breast unspecified estrogen receptor site of breast. There are no reports of uncontrolled pain; no reports of agitation or anxiety.   Past Medical History:  Diagnosis Date  . Breast cancer ([place])   . Cognitive communication deficit   . Dementia ([place])   . Difficulty in walking(719.7)   . Fall   . HTN (hypertension) 08/26/2016  . Muscle weakness (generalized)   . Rash and nonspecific skin eruption   . Thyroid disease    hypo  . Urinary tract infection, site not specified   . Vitamin B deficiency     Past Surgical History:  Procedure Laterality Date  . COLON SURGERY    . IR CATHETER TUBE CHANGE  05/07/2017  . IR EXCHANGE BILIARY DRAIN  07/02/2017  . IR EXCHANGE BILIARY DRAIN  07/10/2017  . IR EXCHANGE BILIARY DRAIN  08/28/2017  . IR EXCHANGE BILIARY DRAIN  10/11/2017  . IR EXCHANGE BILIARY DRAIN  10/28/2017  . IR EXCHANGE BILIARY DRAIN  12/23/2017  . IR EXCHANGE BILIARY DRAIN  01/23/2018  . IR EXCHANGE BILIARY DRAIN  02/17/2018  . IR EXCHANGE BILIARY DRAIN  03/20/2018  . IR EXCHANGE BILIARY DRAIN  04/17/2018  . IR EXCHANGE BILIARY DRAIN  05/29/2018  . IR EXCHANGE BILIARY DRAIN  07/24/2018  . IR EXCHANGE BILIARY DRAIN  09/22/2018  . IR EXCHANGE BILIARY DRAIN  11/20/2018  . IR EXCHANGE BILIARY DRAIN  02/11/2019  . IR EXCHANGE BILIARY DRAIN  04/14/2019  . IR EXCHANGE BILIARY DRAIN  06/09/2019  . IR EXCHANGE BILIARY DRAIN   11/26/2019  . IR EXCHANGE BILIARY DRAIN  12/23/2019  . IR EXCHANGE BILIARY DRAIN  02/22/2020  . IR EXCHANGE BILIARY DRAIN  05/25/2020  . IR PERC CHOLECYSTOSTOMY  01/27/2017  . IR PERC CHOLECYSTOSTOMY  11/18/2019  . IR RADIOLOGIST EVAL & MGMT  03/05/2017  . MASTECTOMY, PARTIAL Right     Social History   Socioeconomic History  . Marital status: Widowed    Spouse name: Not on file  . Number of children: Not on file  . Years of education: Not on file  . Highest education level: Not on file  Occupational History  . Not on file  Tobacco Use  . Smoking status: Never Smoker  . Smokeless tobacco: Never Used  Vaping Use  . Vaping Use: Never used  Substance and Sexual Activity  . Alcohol use: No  . Drug use: No  . Sexual activity: Not on file  Other Topics Concern  . Not on file  Social History Narrative  . Not on file   Social Determinants of Health   Financial Resource Strain:   . Difficulty of Paying Living Expenses: Not on file  Food Insecurity:   . Worried About Charity fundraiser in the Last Year: Not on file  . Ran Out of Food in the Last Year: Not  on file  Transportation Needs:   . Lack of Transportation (Medical): Not on file  . Lack of Transportation (Non-Medical): Not on file  Physical Activity:   . Days of Exercise per Week: Not on file  . Minutes of Exercise per Session: Not on file  Stress:   . Feeling of Stress : Not on file  Social Connections:   . Frequency of Communication with Friends and Family: Not on file  . Frequency of Social Gatherings with Friends and Family: Not on file  . Attends Religious Services: Not on file  . Active Member of Clubs or Organizations: Not on file  . Attends Archivist Meetings: Not on file  . Marital Status: Not on file  Intimate Partner Violence:   . Fear of Current or Ex-Partner: Not on file  . Emotionally Abused: Not on file  . Physically Abused: Not on file  . Sexually Abused: Not on file   Family History  Problem  Relation Age of Onset  . Cancer Father        ? primary  . Diabetes Neg Hx   . Heart disease Neg Hx   . Stroke Neg Hx       VITAL SIGNS BP 126/72   Pulse 80   Temp (!) 97.5 F (36.4 C) (Oral)   Resp 20   Ht 5\' 3"  (1.6 m)   Wt 146 lb 9.6 oz (66.5 kg)   SpO2 93%   BMI 25.97 kg/m   Outpatient Encounter Medications as of 07/11/2020  Medication Sig  . calcium carbonate (TUMS) 500 MG chewable tablet Chew 1 tablet by mouth daily.  . Cholecalciferol 1000 units tablet Take 1,000 Units by mouth daily. hypocalcemia  . Cyanocobalamin (VITAMIN B-12) 1000 MCG SUBL Place 1 tablet under the tongue at bedtime.  . donepezil (ARICEPT) 10 MG tablet Take 1 tablet by mouth at bedtime.  . NON FORMULARY NAS dieT  . Nutritional Supplements (ENSURE ENLIVE PO) Take 1.5 kcal by mouth 2 (two) times daily between meals.  . Nutritional Supplements (NUTRITIONAL SUPPLEMENT PO) Take 1 each by mouth at bedtime. Magic Cup  . polyethylene glycol (MIRALAX / GLYCOLAX) packet Take 17 g by mouth daily as needed for mild constipation or moderate constipation.   . sodium chloride irrigation 0.9 % irrigation Irrigate with 5 mLs as directed daily. Special Instructions: flush cholecystostomy daily   No facility-administered encounter medications on file as of 07/11/2020.     SIGNIFICANT DIAGNOSTIC EXAMS   PREVIOUS;   08-26-19: ct of abdomen  1. Interval removal of the previously seen percutaneous cholecystostomy tube. Cholelithiasis with findings concerning for acute on chronic cholecystitis. Clinical correlation is recommended. Ultrasound may provide better evaluation of the gallbladder. 2. Diffuse colonic diverticulosis. No bowel obstruction. 3. Aortic Atherosclerosis   11-13-19: ct of abdomen and pelvis:  1. Persistently thickened gallbladder wall with numerous gallstones, including one at the level of the gallbladder neck. Surrounding pericholecystic fat stranding without a well-defined fluid collection or  evidence of abscess. Findings are concerning for acute on chronic cholecystitis. Consider nuclear medicine hepatobiliary scan to evaluate patency of the cystic and common bile ducts. 2. Extensive colonic diverticulosis without evidence of diverticulitis.  11-16-19: chest x-ray: Very mild right basilar linear atelectasis.   11-16-19: abdominal ultrasound: Acute cholecystitis.   11-18-19: chole tube placement: Successful ultrasound and fluoroscopic 10 French cholecystostomy   NO NEW EXAMS.    LABS REVIEWED: PREVIOUS:   06-24-19; wbc 6.0; hgb 12.5; hct 40.1; mcv 97.1; plt  255; glucose 96; bun 17; creat 0.87; k+ 4.2; na++ 136; ca 8.6; albumin 3.5; lipase 39 07-09-19: liver normal albumin 3.6 07-21-19: wbc 5.2; hgb 11.9; hct 38.5; mcv 97.0 plt 234; glucose 92; bun 19; creat 0.89; k+ 4.1; na++ 138; ca 8.6 08-26-19: wbc 11.8; hgb 11.3; hct 35.7; mcv 96.5 plt 227; glucose 97; bun 31; creat 1.02; k+ 3.8; na++ 139; ca 8.6; liver normal albumin 2.8   11-16-19: wbc 10.3; hgb 12.1; hct 39.3 mcv 98.5 plt 233; glucose 122; bun 31; creat 1.05; k 3.7 na++ 138; ca 8.5 ast 103 alt 81; albumin 3.0 1-28--21: wbc 12.3; hgb 11.2; hct 35.2; mcv 95.4 plt 265; glucose 88; bun 20; creat 0.95 ;k+ 3.7; na++ 148; ca 8.5 liver normal albumin 2.5 mag 2.3 11-21-19: wbc 8.9; hgb 10.5; hct 33.2; mcv 96.5 plt 265; glucose 95; bun 12; creat 0.86; k+ 3.5; na++ 140; ca 8.1 mag 1.9  12-15-19: glucose 88; bun 14; creat 0.84; k+ 4.2; na++ 139; ca 8.6 05-05-20: wbc 5.0; hgb 13.0; hct 40.5 mcv 96.4 plt 253; glucose 86; bun 16; creat 0.90; k+ 4.0; na++ 137; ca 8.9 liver normal albumin 3.6   NO NEW LABS.   Review of Systems  Constitutional: Negative for malaise/fatigue.  Respiratory: Negative for cough and shortness of breath.   Cardiovascular: Negative for chest pain, palpitations and leg swelling.  Gastrointestinal: Negative for abdominal pain, constipation and heartburn.  Musculoskeletal: Negative for back pain, joint pain and myalgias.    Skin: Negative.   Neurological: Negative for dizziness.  Psychiatric/Behavioral: The patient is not nervous/anxious.       Physical Exam Constitutional:      General: She is not in acute distress.    Appearance: She is well-developed. She is not diaphoretic.  Neck:     Thyroid: No thyromegaly.  Cardiovascular:     Rate and Rhythm: Normal rate and regular rhythm.     Heart sounds: Normal heart sounds.  Pulmonary:     Effort: Pulmonary effort is normal. No respiratory distress.     Breath sounds: Normal breath sounds.  Chest:     Comments: History of right partial mastectomy Abdominal:     General: Bowel sounds are normal. There is no distension.     Palpations: Abdomen is soft.     Tenderness: There is no abdominal tenderness.     Comments: History of right hemi colectomy   Chole drain    Musculoskeletal:        General: Normal range of motion.     Right lower leg: No edema.     Left lower leg: No edema.  Lymphadenopathy:     Cervical: No cervical adenopathy.  Skin:    General: Skin is warm and dry.  Neurological:     Mental Status: She is alert. Mental status is at baseline.  Psychiatric:        Mood and Affect: Mood normal.           ASSESSMENT/ PLAN:  TODAY;   1. Essential hypertension: is stable b/p 126/72 will monitor  2. Chronic constipation: is stable will continue miralax daily as needed  3. Malignant neoplasm of right female breast unspecified estrogen receptor site of breast history of partial mastectomy will monitor   PREVIOUS  4. Calculus of gall bladder with acute cholecystitis and obstruction has chole drain last changed 05-25-20 will monitor   5. Dementia without behavioral disturbance unspecified dementia type is without change weight is 146 pounds will monitor  MD is aware of resident's narcotic use and is in agreement with current plan of care. We will attempt to wean resident as appropriate.  Ok Edwards NP [facility]   Contact [phone] Monday through Friday 8am- 5pm  After hours call [phone]

## 2020-07-26 ENCOUNTER — Non-Acute Institutional Stay (SKILLED_NURSING_FACILITY): Payer: Medicare Other | Admitting: Adult Health

## 2020-07-26 ENCOUNTER — Encounter: Payer: Self-pay | Admitting: Adult Health

## 2020-07-26 DIAGNOSIS — K8012 Calculus of gallbladder with acute and chronic cholecystitis without obstruction: Secondary | ICD-10-CM

## 2020-07-26 NOTE — Progress Notes (Signed)
Location:    [facility] Room Number: 141/D Place of Service:  SNF (31)   CODE STATUS: Full Code  No Known Allergies  Chief Complaint  Patient presents with  . Acute Visit    Chole Drain Malfunction    HPI:  She is complaining today of right upper quad pain; the pain is sharp and intense at times. There is some slight redness around the drain sight. There is no output in the drain. The drain is unable to be flushed. There are no reports of fevers present.   Past Medical History:  Diagnosis Date  . Breast cancer ([place])   . Cognitive communication deficit   . Dementia ([place])   . Difficulty in walking(719.7)   . Fall   . HTN (hypertension) 08/26/2016  . Muscle weakness (generalized)   . Rash and nonspecific skin eruption   . Thyroid disease    hypo  . Urinary tract infection, site not specified   . Vitamin B deficiency     Past Surgical History:  Procedure Laterality Date  . COLON SURGERY    . IR CATHETER TUBE CHANGE  05/07/2017  . IR EXCHANGE BILIARY DRAIN  07/02/2017  . IR EXCHANGE BILIARY DRAIN  07/10/2017  . IR EXCHANGE BILIARY DRAIN  08/28/2017  . IR EXCHANGE BILIARY DRAIN  10/11/2017  . IR EXCHANGE BILIARY DRAIN  10/28/2017  . IR EXCHANGE BILIARY DRAIN  12/23/2017  . IR EXCHANGE BILIARY DRAIN  01/23/2018  . IR EXCHANGE BILIARY DRAIN  02/17/2018  . IR EXCHANGE BILIARY DRAIN  03/20/2018  . IR EXCHANGE BILIARY DRAIN  04/17/2018  . IR EXCHANGE BILIARY DRAIN  05/29/2018  . IR EXCHANGE BILIARY DRAIN  07/24/2018  . IR EXCHANGE BILIARY DRAIN  09/22/2018  . IR EXCHANGE BILIARY DRAIN  11/20/2018  . IR EXCHANGE BILIARY DRAIN  02/11/2019  . IR EXCHANGE BILIARY DRAIN  04/14/2019  . IR EXCHANGE BILIARY DRAIN  06/09/2019  . IR EXCHANGE BILIARY DRAIN  11/26/2019  . IR EXCHANGE BILIARY DRAIN  12/23/2019  . IR EXCHANGE BILIARY DRAIN  02/22/2020  . IR EXCHANGE BILIARY DRAIN  05/25/2020  . IR PERC CHOLECYSTOSTOMY  01/27/2017  . IR PERC CHOLECYSTOSTOMY  11/18/2019  . IR RADIOLOGIST  EVAL & MGMT  03/05/2017  . MASTECTOMY, PARTIAL Right     Social History   Socioeconomic History  . Marital status: Widowed    Spouse name: Not on file  . Number of children: Not on file  . Years of education: Not on file  . Highest education level: Not on file  Occupational History  . Not on file  Tobacco Use  . Smoking status: Never Smoker  . Smokeless tobacco: Never Used  Vaping Use  . Vaping Use: Never used  Substance and Sexual Activity  . Alcohol use: No  . Drug use: No  . Sexual activity: Not on file  Other Topics Concern  . Not on file  Social History Narrative  . Not on file   Social Determinants of Health   Financial Resource Strain:   . Difficulty of Paying Living Expenses: Not on file  Food Insecurity:   . Worried About Charity fundraiser in the Last Year: Not on file  . Ran Out of Food in the Last Year: Not on file  Transportation Needs:   . Lack of Transportation (Medical): Not on file  . Lack of Transportation (Non-Medical): Not on file  Physical Activity:   . Days of Exercise per  Week: Not on file  . Minutes of Exercise per Session: Not on file  Stress:   . Feeling of Stress : Not on file  Social Connections:   . Frequency of Communication with Friends and Family: Not on file  . Frequency of Social Gatherings with Friends and Family: Not on file  . Attends Religious Services: Not on file  . Active Member of Clubs or Organizations: Not on file  . Attends Archivist Meetings: Not on file  . Marital Status: Not on file  Intimate Partner Violence:   . Fear of Current or Ex-Partner: Not on file  . Emotionally Abused: Not on file  . Physically Abused: Not on file  . Sexually Abused: Not on file   Family History  Problem Relation Age of Onset  . Cancer Father        ? primary  . Diabetes Neg Hx   . Heart disease Neg Hx   . Stroke Neg Hx       VITAL SIGNS BP 134/76   Pulse 82   Temp (!) 97.3 F (36.3 C)   Resp 20   Ht 5\' 3"   (1.6 m)   Wt 148 lb 9.6 oz (67.4 kg)   BMI 26.32 kg/m   Outpatient Encounter Medications as of 07/26/2020  Medication Sig  . calcium carbonate (TUMS) 500 MG chewable tablet Chew 1 tablet by mouth daily.  . Cholecalciferol 1000 units tablet Take 1,000 Units by mouth daily. hypocalcemia  . Cyanocobalamin (VITAMIN B-12) 1000 MCG SUBL Place 1 tablet under the tongue at bedtime.  . donepezil (ARICEPT) 10 MG tablet Take 1 tablet by mouth at bedtime.  . NON FORMULARY NAS dieT  . Nutritional Supplements (ENSURE ENLIVE PO) Take 1.5 kcal by mouth 2 (two) times daily between meals.  . Nutritional Supplements (NUTRITIONAL SUPPLEMENT PO) Take 1 each by mouth at bedtime. Magic Cup  . polyethylene glycol (MIRALAX / GLYCOLAX) packet Take 17 g by mouth daily as needed for mild constipation or moderate constipation.   . sodium chloride irrigation 0.9 % irrigation Irrigate with 5 mLs as directed daily. Special Instructions: flush cholecystostomy daily   No facility-administered encounter medications on file as of 07/26/2020.     SIGNIFICANT DIAGNOSTIC EXAMS   PREVIOUS;   08-26-19: ct of abdomen  1. Interval removal of the previously seen percutaneous cholecystostomy tube. Cholelithiasis with findings concerning for acute on chronic cholecystitis. Clinical correlation is recommended. Ultrasound may provide better evaluation of the gallbladder. 2. Diffuse colonic diverticulosis. No bowel obstruction. 3. Aortic Atherosclerosis   11-13-19: ct of abdomen and pelvis:  1. Persistently thickened gallbladder wall with numerous gallstones, including one at the level of the gallbladder neck. Surrounding pericholecystic fat stranding without a well-defined fluid collection or evidence of abscess. Findings are concerning for acute on chronic cholecystitis. Consider nuclear medicine hepatobiliary scan to evaluate patency of the cystic and common bile ducts. 2. Extensive colonic diverticulosis without evidence of  diverticulitis.  11-16-19: chest x-ray: Very mild right basilar linear atelectasis.   11-16-19: abdominal ultrasound: Acute cholecystitis.   11-18-19: chole tube placement: Successful ultrasound and fluoroscopic 10 French cholecystostomy   NO NEW EXAMS.    LABS REVIEWED: PREVIOUS:   08-26-19: wbc 11.8; hgb 11.3; hct 35.7; mcv 96.5 plt 227; glucose 97; bun 31; creat 1.02; k+ 3.8; na++ 139; ca 8.6; liver normal albumin 2.8   11-16-19: wbc 10.3; hgb 12.1; hct 39.3 mcv 98.5 plt 233; glucose 122; bun 31; creat 1.05; k 3.7 na++  138; ca 8.5 ast 103 alt 81; albumin 3.0 1-28--21: wbc 12.3; hgb 11.2; hct 35.2; mcv 95.4 plt 265; glucose 88; bun 20; creat 0.95 ;k+ 3.7; na++ 148; ca 8.5 liver normal albumin 2.5 mag 2.3 11-21-19: wbc 8.9; hgb 10.5; hct 33.2; mcv 96.5 plt 265; glucose 95; bun 12; creat 0.86; k+ 3.5; na++ 140; ca 8.1 mag 1.9  12-15-19: glucose 88; bun 14; creat 0.84; k+ 4.2; na++ 139; ca 8.6 05-05-20: wbc 5.0; hgb 13.0; hct 40.5 mcv 96.4 plt 253; glucose 86; bun 16; creat 0.90; k+ 4.0; na++ 137; ca 8.9 liver normal albumin 3.6   NO NEW LABS.  Review of Systems  Constitutional: Negative for malaise/fatigue.  Respiratory: Negative for cough and shortness of breath.   Cardiovascular: Negative for chest pain, palpitations and leg swelling.  Gastrointestinal: Positive for abdominal pain. Negative for constipation and heartburn.       Right upper quad   Musculoskeletal: Negative for back pain, joint pain and myalgias.  Skin: Negative.   Neurological: Negative for dizziness.  Psychiatric/Behavioral: The patient is not nervous/anxious.     Physical Exam Constitutional:      General: She is not in acute distress.    Appearance: She is well-developed. She is not diaphoretic.  Neck:     Thyroid: No thyromegaly.  Cardiovascular:     Rate and Rhythm: Normal rate and regular rhythm.     Heart sounds: Normal heart sounds.  Pulmonary:     Effort: Pulmonary effort is normal. No respiratory  distress.     Breath sounds: Normal breath sounds.  Chest:     Comments: History of right partial mastectomy Abdominal:     General: Bowel sounds are normal. There is no distension.     Palpations: Abdomen is soft.     Tenderness: There is no abdominal tenderness.  Musculoskeletal:        General: Normal range of motion.     Right lower leg: No edema.     Left lower leg: No edema.  Lymphadenopathy:     Cervical: No cervical adenopathy.  Skin:    General: Skin is warm and dry.     Comments: Chole drain with slight redness present no warmth present   Neurological:     Mental Status: She is alert. Mental status is at baseline.  Psychiatric:        Mood and Affect: Mood normal.      ASSESSMENT/ PLAN:  TODAY  1. Calculus of gall bladder with acute on chronic cholecystitis without obstruction Is worse Have her to be setup to exchange the drain Will continue to monitor her status.   MD is aware of resident's narcotic use and is in agreement with current plan of care. We will attempt to wean resident as appropriate.  Ok Edwards NP [facility]  Contact [phone] Monday through Friday 8am- 5pm  After hours call [phone]

## 2020-07-29 ENCOUNTER — Ambulatory Visit (HOSPITAL_COMMUNITY)
Admission: RE | Admit: 2020-07-29 | Discharge: 2020-07-29 | Disposition: A | Discharge status: Home / Self Care | Payer: Medicare Other | Source: Ambulatory Visit | Attending: Interventional Radiology | Admitting: Interventional Radiology

## 2020-07-29 DIAGNOSIS — K819 Cholecystitis, unspecified: Secondary | ICD-10-CM | POA: Diagnosis not present

## 2020-07-29 DIAGNOSIS — Z434 Encounter for attention to other artificial openings of digestive tract: Secondary | ICD-10-CM | POA: Insufficient documentation

## 2020-07-29 DIAGNOSIS — T85520A Displacement of bile duct prosthesis, initial encounter: Secondary | ICD-10-CM | POA: Diagnosis not present

## 2020-07-29 HISTORY — PX: IR EXCHANGE BILIARY DRAIN: IMG6046

## 2020-07-29 MED ORDER — LIDOCAINE HCL 1 % IJ SOLN
INTRAMUSCULAR | Status: DC | PRN
  Administered 2020-07-29: 10 mL

## 2020-07-29 MED ORDER — IOHEXOL 300 MG/ML  SOLN
50.0000 mL | Freq: Once | INTRAMUSCULAR | Status: AC | PRN
  Administered 2020-07-29: 15 mL

## 2020-07-29 MED ORDER — LIDOCAINE HCL 1 % IJ SOLN
INTRAMUSCULAR | Status: AC
  Filled 2020-07-29: qty 20

## 2020-08-11 ENCOUNTER — Encounter: Payer: Self-pay | Admitting: Adult Health

## 2020-08-11 ENCOUNTER — Non-Acute Institutional Stay (SKILLED_NURSING_FACILITY): Payer: Medicare Other | Admitting: Adult Health

## 2020-08-11 DIAGNOSIS — F0391 Unspecified dementia with behavioral disturbance: Secondary | ICD-10-CM | POA: Diagnosis not present

## 2020-08-11 DIAGNOSIS — S12191D Other nondisplaced fracture of second cervical vertebra, subsequent encounter for fracture with routine healing: Secondary | ICD-10-CM | POA: Diagnosis not present

## 2020-08-11 DIAGNOSIS — C50911 Malignant neoplasm of unspecified site of right female breast: Secondary | ICD-10-CM

## 2020-08-11 NOTE — Progress Notes (Signed)
Location:    [facility] Room Number: 141/D Place of Service:  SNF (31)   CODE STATUS: Full Code  No Known Allergies  Chief Complaint  Patient presents with  . Acute Visit    Care Plan Meeting    HPI:  We have come together for her care plan meeting. BIMS 6/15 mood 0/30. Her weight is stable ay 148.6 pounds. She has a fair appetite and does have supplements. There have been no falls. Is independent to supervision with her adls; does feed self. Is occasionally incontinent of bladder and continent of bowel. She does go to the dining room most days. She is due to have her chole drain changed December. There are no reports of further pain since her drain was adjusted. She continues to be followed for her chronic illnesses including:  Dementia with behavioral disturbance unspecified dementia type Other closed nondisplaced fracture of second cervical vertebrae with routine healing subsequent encounter.  Malignant neoplasm of female right breast unspecified estrogen receptor status unspecified site of breast.   Past Medical History:  Diagnosis Date  . Breast cancer ([place])   . Cognitive communication deficit   . Dementia ([place])   . Difficulty in walking(719.7)   . Fall   . HTN (hypertension) 08/26/2016  . Muscle weakness (generalized)   . Rash and nonspecific skin eruption   . Thyroid disease    hypo  . Urinary tract infection, site not specified   . Vitamin B deficiency     Past Surgical History:  Procedure Laterality Date  . COLON SURGERY    . IR CATHETER TUBE CHANGE  05/07/2017  . IR EXCHANGE BILIARY DRAIN  07/02/2017  . IR EXCHANGE BILIARY DRAIN  07/10/2017  . IR EXCHANGE BILIARY DRAIN  08/28/2017  . IR EXCHANGE BILIARY DRAIN  10/11/2017  . IR EXCHANGE BILIARY DRAIN  10/28/2017  . IR EXCHANGE BILIARY DRAIN  12/23/2017  . IR EXCHANGE BILIARY DRAIN  01/23/2018  . IR EXCHANGE BILIARY DRAIN  02/17/2018  . IR EXCHANGE BILIARY DRAIN  03/20/2018  . IR EXCHANGE BILIARY  DRAIN  04/17/2018  . IR EXCHANGE BILIARY DRAIN  05/29/2018  . IR EXCHANGE BILIARY DRAIN  07/24/2018  . IR EXCHANGE BILIARY DRAIN  09/22/2018  . IR EXCHANGE BILIARY DRAIN  11/20/2018  . IR EXCHANGE BILIARY DRAIN  02/11/2019  . IR EXCHANGE BILIARY DRAIN  04/14/2019  . IR EXCHANGE BILIARY DRAIN  06/09/2019  . IR EXCHANGE BILIARY DRAIN  11/26/2019  . IR EXCHANGE BILIARY DRAIN  12/23/2019  . IR EXCHANGE BILIARY DRAIN  02/22/2020  . IR EXCHANGE BILIARY DRAIN  05/25/2020  . IR EXCHANGE BILIARY DRAIN  07/29/2020  . IR PERC CHOLECYSTOSTOMY  01/27/2017  . IR PERC CHOLECYSTOSTOMY  11/18/2019  . IR RADIOLOGIST EVAL & MGMT  03/05/2017  . MASTECTOMY, PARTIAL Right     Social History   Socioeconomic History  . Marital status: Widowed    Spouse name: Not on file  . Number of children: Not on file  . Years of education: Not on file  . Highest education level: Not on file  Occupational History  . Not on file  Tobacco Use  . Smoking status: Never Smoker  . Smokeless tobacco: Never Used  Vaping Use  . Vaping Use: Never used  Substance and Sexual Activity  . Alcohol use: No  . Drug use: No  . Sexual activity: Not on file  Other Topics Concern  . Not on file  Social History Narrative  .  Not on file   Social Determinants of Health   Financial Resource Strain:   . Difficulty of Paying Living Expenses: Not on file  Food Insecurity:   . Worried About Charity fundraiser in the Last Year: Not on file  . Ran Out of Food in the Last Year: Not on file  Transportation Needs:   . Lack of Transportation (Medical): Not on file  . Lack of Transportation (Non-Medical): Not on file  Physical Activity:   . Days of Exercise per Week: Not on file  . Minutes of Exercise per Session: Not on file  Stress:   . Feeling of Stress : Not on file  Social Connections:   . Frequency of Communication with Friends and Family: Not on file  . Frequency of Social Gatherings with Friends and Family: Not on file  . Attends Religious  Services: Not on file  . Active Member of Clubs or Organizations: Not on file  . Attends Archivist Meetings: Not on file  . Marital Status: Not on file  Intimate Partner Violence:   . Fear of Current or Ex-Partner: Not on file  . Emotionally Abused: Not on file  . Physically Abused: Not on file  . Sexually Abused: Not on file   Family History  Problem Relation Age of Onset  . Cancer Father        ? primary  . Diabetes Neg Hx   . Heart disease Neg Hx   . Stroke Neg Hx       VITAL SIGNS BP 128/73   Pulse 74   Temp 98.1 F (36.7 C)   Resp 20   Ht 5\' 3"  (1.6 m)   Wt 148 lb 9.6 oz (67.4 kg)   BMI 26.32 kg/m   Outpatient Encounter Medications as of 08/11/2020  Medication Sig  . calcium carbonate (TUMS) 500 MG chewable tablet Chew 1 tablet by mouth daily.  . Cholecalciferol 1000 units tablet Take 1,000 Units by mouth daily. hypocalcemia  . Cyanocobalamin (VITAMIN B-12) 1000 MCG SUBL Place 1 tablet under the tongue at bedtime.  . donepezil (ARICEPT) 10 MG tablet Take 1 tablet by mouth at bedtime.  . fluconazole (DIFLUCAN) 150 MG tablet Take 150 mg by mouth daily.  . NON FORMULARY NAS dieT  . Nutritional Supplements (ENSURE ENLIVE PO) Take 1.5 kcal by mouth 2 (two) times daily between meals.  . Nutritional Supplements (NUTRITIONAL SUPPLEMENT PO) Take 1 each by mouth at bedtime. Magic Cup  . polyethylene glycol (MIRALAX / GLYCOLAX) packet Take 17 g by mouth daily as needed for mild constipation or moderate constipation.   . sodium chloride irrigation 0.9 % irrigation Irrigate with 5 mLs as directed daily. Special Instructions: flush cholecystostomy daily   No facility-administered encounter medications on file as of 08/11/2020.     SIGNIFICANT DIAGNOSTIC EXAMS  PREVIOUS;   08-26-19: ct of abdomen  1. Interval removal of the previously seen percutaneous cholecystostomy tube. Cholelithiasis with findings concerning for acute on chronic cholecystitis. Clinical  correlation is recommended. Ultrasound may provide better evaluation of the gallbladder. 2. Diffuse colonic diverticulosis. No bowel obstruction. 3. Aortic Atherosclerosis   11-13-19: ct of abdomen and pelvis:  1. Persistently thickened gallbladder wall with numerous gallstones, including one at the level of the gallbladder neck. Surrounding pericholecystic fat stranding without a well-defined fluid collection or evidence of abscess. Findings are concerning for acute on chronic cholecystitis. Consider nuclear medicine hepatobiliary scan to evaluate patency of the cystic and common bile ducts.  2. Extensive colonic diverticulosis without evidence of diverticulitis.  11-16-19: chest x-ray: Very mild right basilar linear atelectasis.   11-16-19: abdominal ultrasound: Acute cholecystitis.   11-18-19: chole tube placement: Successful ultrasound and fluoroscopic 10 French cholecystostomy   NO NEW EXAMS.    LABS REVIEWED: PREVIOUS:   08-26-19: wbc 11.8; hgb 11.3; hct 35.7; mcv 96.5 plt 227; glucose 97; bun 31; creat 1.02; k+ 3.8; na++ 139; ca 8.6; liver normal albumin 2.8   11-16-19: wbc 10.3; hgb 12.1; hct 39.3 mcv 98.5 plt 233; glucose 122; bun 31; creat 1.05; k 3.7 na++ 138; ca 8.5 ast 103 alt 81; albumin 3.0 1-28--21: wbc 12.3; hgb 11.2; hct 35.2; mcv 95.4 plt 265; glucose 88; bun 20; creat 0.95 ;k+ 3.7; na++ 148; ca 8.5 liver normal albumin 2.5 mag 2.3 11-21-19: wbc 8.9; hgb 10.5; hct 33.2; mcv 96.5 plt 265; glucose 95; bun 12; creat 0.86; k+ 3.5; na++ 140; ca 8.1 mag 1.9  12-15-19: glucose 88; bun 14; creat 0.84; k+ 4.2; na++ 139; ca 8.6 05-05-20: wbc 5.0; hgb 13.0; hct 40.5 mcv 96.4 plt 253; glucose 86; bun 16; creat 0.90; k+ 4.0; na++ 137; ca 8.9 liver normal albumin 3.6   NO NEW LABS.   Review of Systems  Constitutional: Negative for malaise/fatigue.  Respiratory: Negative for cough and shortness of breath.   Cardiovascular: Negative for chest pain, palpitations and leg swelling.   Gastrointestinal: Negative for abdominal pain, constipation and heartburn.  Musculoskeletal: Negative for back pain, joint pain and myalgias.  Skin: Negative.   Neurological: Negative for dizziness.  Psychiatric/Behavioral: The patient is not nervous/anxious.     Physical Exam Constitutional:      General: She is not in acute distress.    Appearance: She is well-developed. She is not diaphoretic.  Neck:     Thyroid: No thyromegaly.  Cardiovascular:     Rate and Rhythm: Normal rate and regular rhythm.     Heart sounds: Normal heart sounds.  Pulmonary:     Effort: Pulmonary effort is normal. No respiratory distress.     Breath sounds: Normal breath sounds.  Chest:     Comments: History of right partial mastectomy Abdominal:     General: Bowel sounds are normal. There is no distension.     Palpations: Abdomen is soft.     Tenderness: There is no abdominal tenderness.     Comments: Chole drain  present  Musculoskeletal:        General: Normal range of motion.     Right lower leg: No edema.     Left lower leg: No edema.  Lymphadenopathy:     Cervical: No cervical adenopathy.  Skin:    General: Skin is warm and dry.  Neurological:     Mental Status: She is alert. Mental status is at baseline.  Psychiatric:        Mood and Affect: Mood normal.       ASSESSMENT/ PLAN:  TODAY  1. Dementia with behavioral disturbance unspecified dementia type 2. Other closed nondisplaced fracture of second cervical vertebrae with routine healing subsequent encounter.  3. Malignant neoplasm of female right breast unspecified estrogen receptor status unspecified site of breast.   Will continue current medications  Will continue current plan of care Will continue to monitor her status.    MD is aware of resident's narcotic use and is in agreement with current plan of care. We will attempt to wean resident as appropriate.  Ok Edwards NP [facility]  Contact  [phone]  Monday through Friday 8am- 5pm  After hours call [phone]

## 2020-08-16 ENCOUNTER — Non-Acute Institutional Stay (SKILLED_NURSING_FACILITY): Payer: Medicare Other | Admitting: Adult Health

## 2020-08-16 ENCOUNTER — Encounter: Payer: Self-pay | Admitting: Adult Health

## 2020-08-16 DIAGNOSIS — C50911 Malignant neoplasm of unspecified site of right female breast: Secondary | ICD-10-CM | POA: Diagnosis not present

## 2020-08-16 DIAGNOSIS — F0391 Unspecified dementia with behavioral disturbance: Secondary | ICD-10-CM | POA: Diagnosis not present

## 2020-08-16 DIAGNOSIS — K8012 Calculus of gallbladder with acute and chronic cholecystitis without obstruction: Secondary | ICD-10-CM | POA: Diagnosis not present

## 2020-08-16 NOTE — Progress Notes (Signed)
Location:    [facility] Room Number: 141/D Place of Service:  SNF (31)   CODE STATUS: Full Code  No Known Allergies  Chief Complaint  Patient presents with  . Medical Management of Chronic Issues             Malignant neoplasm of right female breast unspecified estrogen receptor site of breast:    Calculus of gall bladder with acute cholecystitis and obstruction has chole drain;  Dementia without behavioral disturbance unspecified dementia type:    HPI:  She is a 86 year old long term resident of this facility being seen for the management of her chronic illnesses;Malignant neoplasm of right female breast unspecified estrogen receptor site of breast:    Calculus of gall bladder with acute cholecystitis and obstruction has chole drain;  Dementia without behavioral disturbance unspecified dementia type. There are no reports of anxiety agitation; no abdominal pain; no changes in appetite.    Past Medical History:  Diagnosis Date  . Breast cancer ([place])   . Cognitive communication deficit   . Dementia ([place])   . Difficulty in walking(719.7)   . Fall   . HTN (hypertension) 08/26/2016  . Muscle weakness (generalized)   . Rash and nonspecific skin eruption   . Thyroid disease    hypo  . Urinary tract infection, site not specified   . Vitamin B deficiency     Past Surgical History:  Procedure Laterality Date  . COLON SURGERY    . IR CATHETER TUBE CHANGE  05/07/2017  . IR EXCHANGE BILIARY DRAIN  07/02/2017  . IR EXCHANGE BILIARY DRAIN  07/10/2017  . IR EXCHANGE BILIARY DRAIN  08/28/2017  . IR EXCHANGE BILIARY DRAIN  10/11/2017  . IR EXCHANGE BILIARY DRAIN  10/28/2017  . IR EXCHANGE BILIARY DRAIN  12/23/2017  . IR EXCHANGE BILIARY DRAIN  01/23/2018  . IR EXCHANGE BILIARY DRAIN  02/17/2018  . IR EXCHANGE BILIARY DRAIN  03/20/2018  . IR EXCHANGE BILIARY DRAIN  04/17/2018  . IR EXCHANGE BILIARY DRAIN  05/29/2018  . IR EXCHANGE BILIARY DRAIN  07/24/2018  . IR EXCHANGE  BILIARY DRAIN  09/22/2018  . IR EXCHANGE BILIARY DRAIN  11/20/2018  . IR EXCHANGE BILIARY DRAIN  02/11/2019  . IR EXCHANGE BILIARY DRAIN  04/14/2019  . IR EXCHANGE BILIARY DRAIN  06/09/2019  . IR EXCHANGE BILIARY DRAIN  11/26/2019  . IR EXCHANGE BILIARY DRAIN  12/23/2019  . IR EXCHANGE BILIARY DRAIN  02/22/2020  . IR EXCHANGE BILIARY DRAIN  05/25/2020  . IR EXCHANGE BILIARY DRAIN  07/29/2020  . IR PERC CHOLECYSTOSTOMY  01/27/2017  . IR PERC CHOLECYSTOSTOMY  11/18/2019  . IR RADIOLOGIST EVAL & MGMT  03/05/2017  . MASTECTOMY, PARTIAL Right     Social History   Socioeconomic History  . Marital status: Widowed    Spouse name: Not on file  . Number of children: Not on file  . Years of education: Not on file  . Highest education level: Not on file  Occupational History  . Not on file  Tobacco Use  . Smoking status: Never Smoker  . Smokeless tobacco: Never Used  Vaping Use  . Vaping Use: Never used  Substance and Sexual Activity  . Alcohol use: No  . Drug use: No  . Sexual activity: Not on file  Other Topics Concern  . Not on file  Social History Narrative  . Not on file   Social Determinants of Health   Financial Resource Strain:   .  Difficulty of Paying Living Expenses: Not on file  Food Insecurity:   . Worried About Charity fundraiser in the Last Year: Not on file  . Ran Out of Food in the Last Year: Not on file  Transportation Needs:   . Lack of Transportation (Medical): Not on file  . Lack of Transportation (Non-Medical): Not on file  Physical Activity:   . Days of Exercise per Week: Not on file  . Minutes of Exercise per Session: Not on file  Stress:   . Feeling of Stress : Not on file  Social Connections:   . Frequency of Communication with Friends and Family: Not on file  . Frequency of Social Gatherings with Friends and Family: Not on file  . Attends Religious Services: Not on file  . Active Member of Clubs or Organizations: Not on file  . Attends Archivist  Meetings: Not on file  . Marital Status: Not on file  Intimate Partner Violence:   . Fear of Current or Ex-Partner: Not on file  . Emotionally Abused: Not on file  . Physically Abused: Not on file  . Sexually Abused: Not on file   Family History  Problem Relation Age of Onset  . Cancer Father        ? primary  . Diabetes Neg Hx   . Heart disease Neg Hx   . Stroke Neg Hx       VITAL SIGNS BP 113/71   Pulse 75   Temp 98.1 F (36.7 C)   Resp 18   Ht 5\' 3"  (1.6 m)   Wt 148 lb 9.6 oz (67.4 kg)   SpO2 93%   BMI 26.32 kg/m   Outpatient Encounter Medications as of 08/16/2020  Medication Sig  . calcium carbonate (TUMS) 500 MG chewable tablet Chew 1 tablet by mouth daily.  . Cholecalciferol 1000 units tablet Take 1,000 Units by mouth daily. hypocalcemia  . Cyanocobalamin (VITAMIN B-12) 1000 MCG SUBL Place 1 tablet under the tongue at bedtime.  . donepezil (ARICEPT) 10 MG tablet Take 1 tablet by mouth at bedtime.  . NON FORMULARY NAS dieT  . Nutritional Supplements (ENSURE ENLIVE PO) Take 1.5 kcal by mouth 2 (two) times daily between meals.  . Nutritional Supplements (NUTRITIONAL SUPPLEMENT PO) Take 1 each by mouth at bedtime. Magic Cup  . polyethylene glycol (MIRALAX / GLYCOLAX) packet Take 17 g by mouth daily as needed for mild constipation or moderate constipation.   . sodium chloride irrigation 0.9 % irrigation Irrigate with 5 mLs as directed daily. Special Instructions: flush cholecystostomy daily   No facility-administered encounter medications on file as of 08/16/2020.     SIGNIFICANT DIAGNOSTIC EXAMS   PREVIOUS;   08-26-19: ct of abdomen  1. Interval removal of the previously seen percutaneous cholecystostomy tube. Cholelithiasis with findings concerning for acute on chronic cholecystitis. Clinical correlation is recommended. Ultrasound may provide better evaluation of the gallbladder. 2. Diffuse colonic diverticulosis. No bowel obstruction. 3. Aortic  Atherosclerosis   11-13-19: ct of abdomen and pelvis:  1. Persistently thickened gallbladder wall with numerous gallstones, including one at the level of the gallbladder neck. Surrounding pericholecystic fat stranding without a well-defined fluid collection or evidence of abscess. Findings are concerning for acute on chronic cholecystitis. Consider nuclear medicine hepatobiliary scan to evaluate patency of the cystic and common bile ducts. 2. Extensive colonic diverticulosis without evidence of diverticulitis.  11-16-19: chest x-ray: Very mild right basilar linear atelectasis.   11-16-19: abdominal ultrasound: Acute cholecystitis.  11-18-19: chole tube placement: Successful ultrasound and fluoroscopic 10 French cholecystostomy   NO NEW EXAMS.    LABS REVIEWED: PREVIOUS:   06-24-19; wbc 6.0; hgb 12.5; hct 40.1; mcv 97.1; plt 255; glucose 96; bun 17; creat 0.87; k+ 4.2; na++ 136; ca 8.6; albumin 3.5; lipase 39 07-09-19: liver normal albumin 3.6 07-21-19: wbc 5.2; hgb 11.9; hct 38.5; mcv 97.0 plt 234; glucose 92; bun 19; creat 0.89; k+ 4.1; na++ 138; ca 8.6 08-26-19: wbc 11.8; hgb 11.3; hct 35.7; mcv 96.5 plt 227; glucose 97; bun 31; creat 1.02; k+ 3.8; na++ 139; ca 8.6; liver normal albumin 2.8   11-16-19: wbc 10.3; hgb 12.1; hct 39.3 mcv 98.5 plt 233; glucose 122; bun 31; creat 1.05; k 3.7 na++ 138; ca 8.5 ast 103 alt 81; albumin 3.0 1-28--21: wbc 12.3; hgb 11.2; hct 35.2; mcv 95.4 plt 265; glucose 88; bun 20; creat 0.95 ;k+ 3.7; na++ 148; ca 8.5 liver normal albumin 2.5 mag 2.3 11-21-19: wbc 8.9; hgb 10.5; hct 33.2; mcv 96.5 plt 265; glucose 95; bun 12; creat 0.86; k+ 3.5; na++ 140; ca 8.1 mag 1.9  12-15-19: glucose 88; bun 14; creat 0.84; k+ 4.2; na++ 139; ca 8.6 05-05-20: wbc 5.0; hgb 13.0; hct 40.5 mcv 96.4 plt 253; glucose 86; bun 16; creat 0.90; k+ 4.0; na++ 137; ca 8.9 liver normal albumin 3.6   NO NEW LABS.   Review of Systems  Constitutional: Negative for malaise/fatigue.  Respiratory:  Negative for cough and shortness of breath.   Cardiovascular: Negative for chest pain, palpitations and leg swelling.  Gastrointestinal: Negative for abdominal pain, constipation and heartburn.  Musculoskeletal: Negative for back pain, joint pain and myalgias.  Skin: Negative.   Neurological: Negative for dizziness.  Psychiatric/Behavioral: The patient is not nervous/anxious.      Physical Exam Constitutional:      General: She is not in acute distress.    Appearance: She is well-developed. She is not diaphoretic.  Neck:     Thyroid: No thyromegaly.  Cardiovascular:     Rate and Rhythm: Normal rate and regular rhythm.     Heart sounds: Normal heart sounds.  Pulmonary:     Effort: Pulmonary effort is normal. No respiratory distress.     Breath sounds: Normal breath sounds.  Chest:     Comments: History of right partial mastectomy Abdominal:     General: Bowel sounds are normal. There is no distension.     Palpations: Abdomen is soft.     Tenderness: There is no abdominal tenderness.     Comments: Chole drain  present   Musculoskeletal:        General: Normal range of motion.     Right lower leg: No edema.     Left lower leg: No edema.  Lymphadenopathy:     Cervical: No cervical adenopathy.  Skin:    General: Skin is warm and dry.  Neurological:     Mental Status: She is alert. Mental status is at baseline.  Psychiatric:        Mood and Affect: Mood normal.             ASSESSMENT/ PLAN:  TODAY;   1. Malignant neoplasm of right female breast unspecified estrogen receptor site of breast: history of partial mastectomy will monitor   2. Calculus of gall bladder with acute cholecystitis and obstruction has chole drain; last changed 05-25-20. Will monitor   3. Dementia without behavioral disturbance unspecified dementia type: without change weight is 148 pounds will  monitor   PREVIOUS   4. Essential hypertension: is stable b/p 113/71 will monitor  5. Chronic  constipation: is stable will continue miralax daily as needed    MD is aware of resident's narcotic use and is in agreement with current plan of care. We will attempt to wean resident as appropriate.  Ok Edwards NP [facility]  Contact [phone] Monday through Friday 8am- 5pm  After hours call [phone]

## 2020-08-17 ENCOUNTER — Other Ambulatory Visit (HOSPITAL_COMMUNITY): Payer: Medicare Other

## 2020-08-29 ENCOUNTER — Other Ambulatory Visit: Payer: Self-pay | Admitting: Radiology

## 2020-08-30 ENCOUNTER — Inpatient Hospital Stay (HOSPITAL_COMMUNITY)
Admit: 2020-08-30 | Discharge: 2020-08-30 | Disposition: A | Discharge status: Home / Self Care | Payer: Medicare Other | Attending: Interventional Radiology | Admitting: Interventional Radiology

## 2020-08-30 DIAGNOSIS — Z434 Encounter for attention to other artificial openings of digestive tract: Secondary | ICD-10-CM | POA: Diagnosis not present

## 2020-08-30 HISTORY — PX: IR EXCHANGE BILIARY DRAIN: IMG6046

## 2020-08-30 MED ORDER — IOHEXOL 300 MG/ML  SOLN: 50.0000 mL | Freq: Once | INTRAMUSCULAR | Status: DC | PRN

## 2020-08-30 MED ORDER — LIDOCAINE HCL (PF) 1 % IJ SOLN
INTRAMUSCULAR | Status: AC | PRN
  Administered 2020-08-30: 10 mL

## 2020-08-30 MED ORDER — LIDOCAINE HCL 1 % IJ SOLN
INTRAMUSCULAR | Status: AC
  Filled 2020-08-30: qty 20

## 2020-09-05 ENCOUNTER — Encounter: Payer: Self-pay | Admitting: Adult Health

## 2020-09-05 ENCOUNTER — Other Ambulatory Visit (HOSPITAL_COMMUNITY)
Admission: RE | Admit: 2020-09-05 | Discharge: 2020-09-05 | Disposition: A | Discharge status: Home / Self Care | Payer: Medicare Other | Source: Skilled Nursing Facility | Attending: Adult Health | Admitting: Adult Health

## 2020-09-05 ENCOUNTER — Non-Acute Institutional Stay (SKILLED_NURSING_FACILITY): Payer: Medicare Other | Admitting: Adult Health

## 2020-09-05 DIAGNOSIS — B002 Herpesviral gingivostomatitis and pharyngotonsillitis: Secondary | ICD-10-CM | POA: Diagnosis present

## 2020-09-05 DIAGNOSIS — B009 Herpesviral infection, unspecified: Secondary | ICD-10-CM | POA: Diagnosis not present

## 2020-09-05 NOTE — Progress Notes (Signed)
Location:    [facility] Room Number: 141/D Place of Service:  SNF (31)   CODE STATUS: Full Code  No Known Allergies  Chief Complaint  Patient presents with  . Acute Visit    Perineal Rash    HPI:  Today staff report that she has a rash on her labia. She does not know how long the rash has been present. The area is slightly swollen red with burst papules. She denies any pain to palpation and denies any itching. There are no reports of fevers. There is no known history of hsv   Past Medical History:  Diagnosis Date  . Breast cancer ([place])   . Cognitive communication deficit   . Dementia ([place])   . Difficulty in walking(719.7)   . Fall   . HTN (hypertension) 08/26/2016  . Muscle weakness (generalized)   . Rash and nonspecific skin eruption   . Thyroid disease    hypo  . Urinary tract infection, site not specified   . Vitamin B deficiency     Past Surgical History:  Procedure Laterality Date  . COLON SURGERY    . IR CATHETER TUBE CHANGE  05/07/2017  . IR EXCHANGE BILIARY DRAIN  07/02/2017  . IR EXCHANGE BILIARY DRAIN  07/10/2017  . IR EXCHANGE BILIARY DRAIN  08/28/2017  . IR EXCHANGE BILIARY DRAIN  10/11/2017  . IR EXCHANGE BILIARY DRAIN  10/28/2017  . IR EXCHANGE BILIARY DRAIN  12/23/2017  . IR EXCHANGE BILIARY DRAIN  01/23/2018  . IR EXCHANGE BILIARY DRAIN  02/17/2018  . IR EXCHANGE BILIARY DRAIN  03/20/2018  . IR EXCHANGE BILIARY DRAIN  04/17/2018  . IR EXCHANGE BILIARY DRAIN  05/29/2018  . IR EXCHANGE BILIARY DRAIN  07/24/2018  . IR EXCHANGE BILIARY DRAIN  09/22/2018  . IR EXCHANGE BILIARY DRAIN  11/20/2018  . IR EXCHANGE BILIARY DRAIN  02/11/2019  . IR EXCHANGE BILIARY DRAIN  04/14/2019  . IR EXCHANGE BILIARY DRAIN  06/09/2019  . IR EXCHANGE BILIARY DRAIN  11/26/2019  . IR EXCHANGE BILIARY DRAIN  12/23/2019  . IR EXCHANGE BILIARY DRAIN  02/22/2020  . IR EXCHANGE BILIARY DRAIN  05/25/2020  . IR EXCHANGE BILIARY DRAIN  07/29/2020  . IR EXCHANGE BILIARY DRAIN   08/30/2020  . IR PERC CHOLECYSTOSTOMY  01/27/2017  . IR PERC CHOLECYSTOSTOMY  11/18/2019  . IR RADIOLOGIST EVAL & MGMT  03/05/2017  . MASTECTOMY, PARTIAL Right     Social History   Socioeconomic History  . Marital status: Widowed    Spouse name: Not on file  . Number of children: Not on file  . Years of education: Not on file  . Highest education level: Not on file  Occupational History  . Not on file  Tobacco Use  . Smoking status: Never Smoker  . Smokeless tobacco: Never Used  Vaping Use  . Vaping Use: Never used  Substance and Sexual Activity  . Alcohol use: No  . Drug use: No  . Sexual activity: Not on file  Other Topics Concern  . Not on file  Social History Narrative  . Not on file   Social Determinants of Health   Financial Resource Strain:   . Difficulty of Paying Living Expenses: Not on file  Food Insecurity:   . Worried About Charity fundraiser in the Last Year: Not on file  . Ran Out of Food in the Last Year: Not on file  Transportation Needs:   . Lack of Transportation (Medical):  Not on file  . Lack of Transportation (Non-Medical): Not on file  Physical Activity:   . Days of Exercise per Week: Not on file  . Minutes of Exercise per Session: Not on file  Stress:   . Feeling of Stress : Not on file  Social Connections:   . Frequency of Communication with Friends and Family: Not on file  . Frequency of Social Gatherings with Friends and Family: Not on file  . Attends Religious Services: Not on file  . Active Member of Clubs or Organizations: Not on file  . Attends Archivist Meetings: Not on file  . Marital Status: Not on file  Intimate Partner Violence:   . Fear of Current or Ex-Partner: Not on file  . Emotionally Abused: Not on file  . Physically Abused: Not on file  . Sexually Abused: Not on file   Family History  Problem Relation Age of Onset  . Cancer Father        ? primary  . Diabetes Neg Hx   . Heart disease Neg Hx   . Stroke Neg  Hx       VITAL SIGNS BP 129/82   Pulse 82   Temp 98.1 F (36.7 C)   Resp 18   Ht 5\' 3"  (1.6 m)   Wt 146 lb 6.4 oz (66.4 kg)   BMI 25.93 kg/m   Outpatient Encounter Medications as of 09/05/2020  Medication Sig  . calcium carbonate (TUMS) 500 MG chewable tablet Chew 1 tablet by mouth daily.  . Cholecalciferol 1000 units tablet Take 1,000 Units by mouth daily. hypocalcemia  . Cyanocobalamin (VITAMIN B-12) 1000 MCG SUBL Place 1 tablet under the tongue at bedtime.  . donepezil (ARICEPT) 10 MG tablet Take 1 tablet by mouth at bedtime.  . NON FORMULARY NAS dieT  . Nutritional Supplements (ENSURE ENLIVE PO) Take 1.5 kcal by mouth 2 (two) times daily between meals.  . Nutritional Supplements (NUTRITIONAL SUPPLEMENT PO) Take 1 each by mouth at bedtime. Magic Cup  . polyethylene glycol (MIRALAX / GLYCOLAX) packet Take 17 g by mouth daily as needed for mild constipation or moderate constipation.   . sodium chloride irrigation 0.9 % irrigation Irrigate with 5 mLs as directed daily. Special Instructions: flush cholecystostomy daily   No facility-administered encounter medications on file as of 09/05/2020.     SIGNIFICANT DIAGNOSTIC EXAMS  PREVIOUS;   08-26-19: ct of abdomen  1. Interval removal of the previously seen percutaneous cholecystostomy tube. Cholelithiasis with findings concerning for acute on chronic cholecystitis. Clinical correlation is recommended. Ultrasound may provide better evaluation of the gallbladder. 2. Diffuse colonic diverticulosis. No bowel obstruction. 3. Aortic Atherosclerosis   11-13-19: ct of abdomen and pelvis:  1. Persistently thickened gallbladder wall with numerous gallstones, including one at the level of the gallbladder neck. Surrounding pericholecystic fat stranding without a well-defined fluid collection or evidence of abscess. Findings are concerning for acute on chronic cholecystitis. Consider nuclear medicine hepatobiliary scan to evaluate patency  of the cystic and common bile ducts. 2. Extensive colonic diverticulosis without evidence of diverticulitis.  11-16-19: chest x-ray: Very mild right basilar linear atelectasis.   11-16-19: abdominal ultrasound: Acute cholecystitis.   11-18-19: chole tube placement: Successful ultrasound and fluoroscopic 10 French cholecystostomy   NO NEW EXAMS.    LABS REVIEWED: PREVIOUS:   08-26-19: wbc 11.8; hgb 11.3; hct 35.7; mcv 96.5 plt 227; glucose 97; bun 31; creat 1.02; k+ 3.8; na++ 139; ca 8.6; liver normal albumin 2.8  11-16-19: wbc 10.3; hgb 12.1; hct 39.3 mcv 98.5 plt 233; glucose 122; bun 31; creat 1.05; k 3.7 na++ 138; ca 8.5 ast 103 alt 81; albumin 3.0 1-28--21: wbc 12.3; hgb 11.2; hct 35.2; mcv 95.4 plt 265; glucose 88; bun 20; creat 0.95 ;k+ 3.7; na++ 148; ca 8.5 liver normal albumin 2.5 mag 2.3 11-21-19: wbc 8.9; hgb 10.5; hct 33.2; mcv 96.5 plt 265; glucose 95; bun 12; creat 0.86; k+ 3.5; na++ 140; ca 8.1 mag 1.9  12-15-19: glucose 88; bun 14; creat 0.84; k+ 4.2; na++ 139; ca 8.6 05-05-20: wbc 5.0; hgb 13.0; hct 40.5 mcv 96.4 plt 253; glucose 86; bun 16; creat 0.90; k+ 4.0; na++ 137; ca 8.9 liver normal albumin 3.6   NO NEW LABS.   Review of Systems  Constitutional: Negative for malaise/fatigue.  Respiratory: Negative for cough and shortness of breath.   Cardiovascular: Negative for chest pain, palpitations and leg swelling.  Gastrointestinal: Negative for abdominal pain, constipation and heartburn.  Musculoskeletal: Negative for back pain, joint pain and myalgias.  Skin: Positive for rash.  Neurological: Negative for dizziness.  Psychiatric/Behavioral: The patient is not nervous/anxious.      Physical Exam Constitutional:      General: She is not in acute distress.    Appearance: She is well-developed. She is not diaphoretic.  Neck:     Thyroid: No thyromegaly.  Cardiovascular:     Rate and Rhythm: Normal rate and regular rhythm.     Pulses: Normal pulses.     Heart sounds:  Normal heart sounds.  Pulmonary:     Effort: Pulmonary effort is normal. No respiratory distress.     Breath sounds: Normal breath sounds.  Chest:     Comments: History of right partial mastectomy Abdominal:     General: Bowel sounds are normal. There is no distension.     Palpations: Abdomen is soft.     Tenderness: There is no abdominal tenderness.     Comments: Chole drain  present    Musculoskeletal:        General: Normal range of motion.     Cervical back: Neck supple.     Right lower leg: No edema.     Left lower leg: No edema.  Lymphadenopathy:     Cervical: No cervical adenopathy.  Skin:    General: Skin is warm and dry.  Neurological:     Mental Status: She is alert. Mental status is at baseline.  Psychiatric:        Mood and Affect: Mood normal.       ASSESSMENT/ PLAN:  TODAY  1. HSV infection: is worse; will get HSV by psc; will begin valtrex 1 gm twice daily through 09-14-20; and will then begin 500 mg daily for suppressive therapy will monitor her status.     MD is aware of resident's narcotic use and is in agreement with current plan of care. We will attempt to wean resident as appropriate.  Ok Edwards NP [facility]  Contact [phone] Monday through Friday 8am- 5pm  After hours call [phone]

## 2020-09-06 LAB — HSV(HERPES SIMPLEX VRS) I + II AB-IGG
HSV 1 Glycoprotein G Ab, IgG: <0.91 index (ref 0.00–0.90)
HSV 2 Glycoprotein G Ab, IgG: <0.91 index (ref 0.00–0.90)

## 2020-09-07 ENCOUNTER — Other Ambulatory Visit (HOSPITAL_COMMUNITY)
Admission: RE | Admit: 2020-09-07 | Discharge: 2020-09-07 | Disposition: A | Discharge status: Home / Self Care | Payer: Medicare Other | Source: Skilled Nursing Facility | Attending: Adult Health | Admitting: Adult Health

## 2020-09-07 DIAGNOSIS — N7689 Other specified inflammation of vagina and vulva: Secondary | ICD-10-CM | POA: Insufficient documentation

## 2020-09-19 ENCOUNTER — Encounter: Payer: Self-pay | Admitting: Adult Health

## 2020-09-19 ENCOUNTER — Non-Acute Institutional Stay (SKILLED_NURSING_FACILITY): Payer: Medicare Other | Admitting: Adult Health

## 2020-09-19 DIAGNOSIS — I10 Essential (primary) hypertension: Secondary | ICD-10-CM | POA: Diagnosis not present

## 2020-09-19 DIAGNOSIS — C50911 Malignant neoplasm of unspecified site of right female breast: Secondary | ICD-10-CM | POA: Diagnosis not present

## 2020-09-19 DIAGNOSIS — K5909 Other constipation: Secondary | ICD-10-CM

## 2020-09-19 NOTE — Progress Notes (Signed)
Location:    [facility] Room Number: 141/D Place of Service:  SNF (31)   CODE STATUS: Full Code  No Known Allergies  Chief Complaint  Patient presents with  . Medical Management of Chronic Issues           Essential hypertension:    Chronic constipation: Malignant neoplasm of right female breast unspecified estrogen receptor site of breast:    HPI:  She is a 86 year old long term resident of this facility being seen for the management of her chronic illnesses:Essential hypertension:    Chronic constipation: Malignant neoplasm of right female breast unspecified estrogen receptor site of breast. There are no reports of pain; no reports of agitation or anxiety; no reports of constipation.    Past Medical History:  Diagnosis Date  . Breast cancer ([place])   . Cognitive communication deficit   . Dementia ([place])   . Difficulty in walking(719.7)   . Fall   . HTN (hypertension) 08/26/2016  . Muscle weakness (generalized)   . Rash and nonspecific skin eruption   . Thyroid disease    hypo  . Urinary tract infection, site not specified   . Vitamin B deficiency     Past Surgical History:  Procedure Laterality Date  . COLON SURGERY    . IR CATHETER TUBE CHANGE  05/07/2017  . IR EXCHANGE BILIARY DRAIN  07/02/2017  . IR EXCHANGE BILIARY DRAIN  07/10/2017  . IR EXCHANGE BILIARY DRAIN  08/28/2017  . IR EXCHANGE BILIARY DRAIN  10/11/2017  . IR EXCHANGE BILIARY DRAIN  10/28/2017  . IR EXCHANGE BILIARY DRAIN  12/23/2017  . IR EXCHANGE BILIARY DRAIN  01/23/2018  . IR EXCHANGE BILIARY DRAIN  02/17/2018  . IR EXCHANGE BILIARY DRAIN  03/20/2018  . IR EXCHANGE BILIARY DRAIN  04/17/2018  . IR EXCHANGE BILIARY DRAIN  05/29/2018  . IR EXCHANGE BILIARY DRAIN  07/24/2018  . IR EXCHANGE BILIARY DRAIN  09/22/2018  . IR EXCHANGE BILIARY DRAIN  11/20/2018  . IR EXCHANGE BILIARY DRAIN  02/11/2019  . IR EXCHANGE BILIARY DRAIN  04/14/2019  . IR EXCHANGE BILIARY DRAIN  06/09/2019  . IR EXCHANGE  BILIARY DRAIN  11/26/2019  . IR EXCHANGE BILIARY DRAIN  12/23/2019  . IR EXCHANGE BILIARY DRAIN  02/22/2020  . IR EXCHANGE BILIARY DRAIN  05/25/2020  . IR EXCHANGE BILIARY DRAIN  07/29/2020  . IR EXCHANGE BILIARY DRAIN  08/30/2020  . IR PERC CHOLECYSTOSTOMY  01/27/2017  . IR PERC CHOLECYSTOSTOMY  11/18/2019  . IR RADIOLOGIST EVAL & MGMT  03/05/2017  . MASTECTOMY, PARTIAL Right     Social History   Socioeconomic History  . Marital status: Widowed    Spouse name: Not on file  . Number of children: Not on file  . Years of education: Not on file  . Highest education level: Not on file  Occupational History  . Not on file  Tobacco Use  . Smoking status: Never Smoker  . Smokeless tobacco: Never Used  Vaping Use  . Vaping Use: Never used  Substance and Sexual Activity  . Alcohol use: No  . Drug use: No  . Sexual activity: Not on file  Other Topics Concern  . Not on file  Social History Narrative  . Not on file   Social Determinants of Health   Financial Resource Strain:   . Difficulty of Paying Living Expenses: Not on file  Food Insecurity:   . Worried About Charity fundraiser in  the Last Year: Not on file  . Ran Out of Food in the Last Year: Not on file  Transportation Needs:   . Lack of Transportation (Medical): Not on file  . Lack of Transportation (Non-Medical): Not on file  Physical Activity:   . Days of Exercise per Week: Not on file  . Minutes of Exercise per Session: Not on file  Stress:   . Feeling of Stress : Not on file  Social Connections:   . Frequency of Communication with Friends and Family: Not on file  . Frequency of Social Gatherings with Friends and Family: Not on file  . Attends Religious Services: Not on file  . Active Member of Clubs or Organizations: Not on file  . Attends Archivist Meetings: Not on file  . Marital Status: Not on file  Intimate Partner Violence:   . Fear of Current or Ex-Partner: Not on file  . Emotionally Abused: Not on file   . Physically Abused: Not on file  . Sexually Abused: Not on file   Family History  Problem Relation Age of Onset  . Cancer Father        ? primary  . Diabetes Neg Hx   . Heart disease Neg Hx   . Stroke Neg Hx       VITAL SIGNS BP 128/70   Pulse 72   Temp 98.2 F (36.8 C)   Resp 20   Ht 5\' 3"  (1.6 m)   Wt 146 lb 6.4 oz (66.4 kg)   SpO2 93%   BMI 25.93 kg/m   Outpatient Encounter Medications as of 09/19/2020  Medication Sig  . calcium carbonate (TUMS) 500 MG chewable tablet Chew 1 tablet by mouth daily.  . Cholecalciferol 1000 units tablet Take 1,000 Units by mouth daily. hypocalcemia  . Cyanocobalamin (VITAMIN B-12) 1000 MCG SUBL Place 1 tablet under the tongue at bedtime.  . donepezil (ARICEPT) 10 MG tablet Take 1 tablet by mouth at bedtime.  . NON FORMULARY NAS dieT  . Nutritional Supplements (ENSURE ENLIVE PO) Take 1.5 kcal by mouth 2 (two) times daily between meals.  . Nutritional Supplements (NUTRITIONAL SUPPLEMENT PO) Take 1 each by mouth at bedtime. Magic Cup  . polyethylene glycol (MIRALAX / GLYCOLAX) packet Take 17 g by mouth daily as needed for mild constipation or moderate constipation.   . sodium chloride irrigation 0.9 % irrigation Irrigate with 5 mLs as directed daily. Special Instructions: flush cholecystostomy daily  . valACYclovir (VALTREX) 500 MG tablet Take 500 mg by mouth daily. Special Instructions: HSV suppression   No facility-administered encounter medications on file as of 09/19/2020.     SIGNIFICANT DIAGNOSTIC EXAMS   PREVIOUS;   08-26-19: ct of abdomen  1. Interval removal of the previously seen percutaneous cholecystostomy tube. Cholelithiasis with findings concerning for acute on chronic cholecystitis. Clinical correlation is recommended. Ultrasound may provide better evaluation of the gallbladder. 2. Diffuse colonic diverticulosis. No bowel obstruction. 3. Aortic Atherosclerosis   11-13-19: ct of abdomen and pelvis:  1. Persistently  thickened gallbladder wall with numerous gallstones, including one at the level of the gallbladder neck. Surrounding pericholecystic fat stranding without a well-defined fluid collection or evidence of abscess. Findings are concerning for acute on chronic cholecystitis. Consider nuclear medicine hepatobiliary scan to evaluate patency of the cystic and common bile ducts. 2. Extensive colonic diverticulosis without evidence of diverticulitis.  11-16-19: chest x-ray: Very mild right basilar linear atelectasis.   11-16-19: abdominal ultrasound: Acute cholecystitis.   11-18-19: chole  tube placement: Successful ultrasound and fluoroscopic 10 French cholecystostomy   NO NEW EXAMS.    LABS REVIEWED: PREVIOUS:   08-26-19: wbc 11.8; hgb 11.3; hct 35.7; mcv 96.5 plt 227; glucose 97; bun 31; creat 1.02; k+ 3.8; na++ 139; ca 8.6; liver normal albumin 2.8   11-16-19: wbc 10.3; hgb 12.1; hct 39.3 mcv 98.5 plt 233; glucose 122; bun 31; creat 1.05; k 3.7 na++ 138; ca 8.5 ast 103 alt 81; albumin 3.0 1-28--21: wbc 12.3; hgb 11.2; hct 35.2; mcv 95.4 plt 265; glucose 88; bun 20; creat 0.95 ;k+ 3.7; na++ 148; ca 8.5 liver normal albumin 2.5 mag 2.3 11-21-19: wbc 8.9; hgb 10.5; hct 33.2; mcv 96.5 plt 265; glucose 95; bun 12; creat 0.86; k+ 3.5; na++ 140; ca 8.1 mag 1.9  12-15-19: glucose 88; bun 14; creat 0.84; k+ 4.2; na++ 139; ca 8.6 05-05-20: wbc 5.0; hgb 13.0; hct 40.5 mcv 96.4 plt 253; glucose 86; bun 16; creat 0.90; k+ 4.0; na++ 137; ca 8.9 liver normal albumin 3.6   NO NEW LABS.   Review of Systems  Constitutional: Negative for malaise/fatigue.  Respiratory: Negative for cough and shortness of breath.   Cardiovascular: Negative for chest pain, palpitations and leg swelling.  Gastrointestinal: Negative for abdominal pain, constipation and heartburn.  Musculoskeletal: Negative for back pain, joint pain and myalgias.  Skin: Negative.   Neurological: Negative for dizziness.  Psychiatric/Behavioral: The patient  is not nervous/anxious.      Physical Exam Constitutional:      General: She is not in acute distress.    Appearance: She is well-developed. She is not diaphoretic.  Neck:     Thyroid: No thyromegaly.  Cardiovascular:     Rate and Rhythm: Normal rate and regular rhythm.     Pulses: Normal pulses.     Heart sounds: Normal heart sounds.  Pulmonary:     Effort: Pulmonary effort is normal. No respiratory distress.     Breath sounds: Normal breath sounds.  Chest:     Comments: History of right partial mastectomy Abdominal:     General: Bowel sounds are normal. There is no distension.     Palpations: Abdomen is soft.     Tenderness: There is no abdominal tenderness.     Comments: Chole drain  present     Musculoskeletal:        General: Normal range of motion.     Cervical back: Neck supple.     Right lower leg: No edema.     Left lower leg: No edema.  Lymphadenopathy:     Cervical: No cervical adenopathy.  Skin:    General: Skin is warm and dry.  Neurological:     Mental Status: She is alert. Mental status is at baseline.  Psychiatric:        Mood and Affect: Mood normal.       ASSESSMENT/ PLAN:  TODAY;   1. Essential hypertension: is stable b/p 128/70 will monitor  2. Chronic constipation: is stable will continue miralax daily as needed  3. Malignant neoplasm of right female breast unspecified estrogen receptor site of breast: history of partial mastectomy will monitor    PREVIOUS   4. Calculus of gall bladder with acute cholecystitis and obstruction has chole drain; last changed 08-30-20. Will monitor   5. Dementia without behavioral disturbance unspecified dementia type: without change weight is 146 pounds will monitor     MD is aware of resident's narcotic use and is in agreement with current plan of  care. We will attempt to wean resident as appropriate.  Ok Edwards NP [facility]  Contact [phone] Monday through Friday 8am- 5pm  After  hours call [phone]

## 2020-09-20 DIAGNOSIS — Z1159 Encounter for screening for other viral diseases: Secondary | ICD-10-CM | POA: Diagnosis not present

## 2020-09-28 ENCOUNTER — Other Ambulatory Visit (HOSPITAL_COMMUNITY): Payer: Medicare Other

## 2020-10-06 ENCOUNTER — Ambulatory Visit (HOSPITAL_COMMUNITY)
Admission: RE | Admit: 2020-10-06 | Discharge: 2020-10-06 | Disposition: A | Discharge status: Home / Self Care | Payer: Medicare Other | Source: Ambulatory Visit | Attending: Adult Health | Admitting: Adult Health

## 2020-10-06 DIAGNOSIS — T85520A Displacement of bile duct prosthesis, initial encounter: Secondary | ICD-10-CM | POA: Diagnosis not present

## 2020-10-06 DIAGNOSIS — Z434 Encounter for attention to other artificial openings of digestive tract: Secondary | ICD-10-CM | POA: Insufficient documentation

## 2020-10-06 HISTORY — PX: IR EXCHANGE BILIARY DRAIN: IMG6046

## 2020-10-06 HISTORY — PX: IR SINUS/FIST TUBE CHK-NON GI: IMG673

## 2020-10-06 MED ORDER — IOHEXOL 300 MG/ML  SOLN
50.0000 mL | Freq: Once | INTRAMUSCULAR | Status: AC | PRN
  Administered 2020-10-06: 5 mL

## 2020-10-06 MED ORDER — LIDOCAINE HCL 1 % IJ SOLN
INTRAMUSCULAR | Status: DC | PRN
  Administered 2020-10-06: 10 mL

## 2020-10-06 MED ORDER — LIDOCAINE HCL 1 % IJ SOLN
INTRAMUSCULAR | Status: AC
  Filled 2020-10-06: qty 20

## 2020-10-11 ENCOUNTER — Encounter (HOSPITAL_COMMUNITY): Payer: Self-pay

## 2020-10-19 DIAGNOSIS — Z1159 Encounter for screening for other viral diseases: Secondary | ICD-10-CM | POA: Diagnosis not present

## 2020-10-20 ENCOUNTER — Non-Acute Institutional Stay (SKILLED_NURSING_FACILITY): Payer: Medicare Other | Admitting: Adult Health

## 2020-10-20 ENCOUNTER — Encounter: Payer: Self-pay | Admitting: Adult Health

## 2020-10-20 DIAGNOSIS — K8012 Calculus of gallbladder with acute and chronic cholecystitis without obstruction: Secondary | ICD-10-CM

## 2020-10-20 DIAGNOSIS — I10 Essential (primary) hypertension: Secondary | ICD-10-CM | POA: Diagnosis not present

## 2020-10-20 DIAGNOSIS — F0391 Unspecified dementia with behavioral disturbance: Secondary | ICD-10-CM

## 2020-10-20 NOTE — Progress Notes (Signed)
Location:  [place] Room Number: [id number] Place of Service:  SNF (31)   CODE STATUS: full code   No Known Allergies  Chief Complaint  Patient presents with  . Medical Management of Chronic Issues      Calculus of gall bladder with acute cholecystitis and obstruction:    Dementia without behavioral disturbance unspecified dementia type:   Essential hypertension:    HPI:  She is a 86 year old long term resident of this facility being seen for the management of her chronic illnesses: Calculus of gall bladder with acute cholecystitis and obstruction:    Dementia without behavioral disturbance unspecified dementia type:   Essential hypertension. There are no reports of abdominal pain or other uncontrolled pain. No reports of agitation or anxiety; no reports of insomnia.   Past Medical History:  Diagnosis Date  . Breast cancer ([place])   . Cognitive communication deficit   . Dementia ([place])   . Difficulty in walking(719.7)   . Fall   . HTN (hypertension) 08/26/2016  . Muscle weakness (generalized)   . Rash and nonspecific skin eruption   . Thyroid disease    hypo  . Urinary tract infection, site not specified   . Vitamin B deficiency     Past Surgical History:  Procedure Laterality Date  . COLON SURGERY    . IR CATHETER TUBE CHANGE  05/07/2017  . IR EXCHANGE BILIARY DRAIN  07/02/2017  . IR EXCHANGE BILIARY DRAIN  07/10/2017  . IR EXCHANGE BILIARY DRAIN  08/28/2017  . IR EXCHANGE BILIARY DRAIN  10/11/2017  . IR EXCHANGE BILIARY DRAIN  10/28/2017  . IR EXCHANGE BILIARY DRAIN  12/23/2017  . IR EXCHANGE BILIARY DRAIN  01/23/2018  . IR EXCHANGE BILIARY DRAIN  02/17/2018  . IR EXCHANGE BILIARY DRAIN  03/20/2018  . IR EXCHANGE BILIARY DRAIN  04/17/2018  . IR EXCHANGE BILIARY DRAIN  05/29/2018  . IR EXCHANGE BILIARY DRAIN  07/24/2018  . IR EXCHANGE BILIARY DRAIN  09/22/2018  . IR EXCHANGE BILIARY DRAIN  11/20/2018  . IR EXCHANGE BILIARY DRAIN  02/11/2019  . IR EXCHANGE BILIARY  DRAIN  04/14/2019  . IR EXCHANGE BILIARY DRAIN  06/09/2019  . IR EXCHANGE BILIARY DRAIN  11/26/2019  . IR EXCHANGE BILIARY DRAIN  12/23/2019  . IR EXCHANGE BILIARY DRAIN  02/22/2020  . IR EXCHANGE BILIARY DRAIN  05/25/2020  . IR EXCHANGE BILIARY DRAIN  07/29/2020  . IR EXCHANGE BILIARY DRAIN  08/30/2020  . IR EXCHANGE BILIARY DRAIN  10/06/2020  . IR PERC CHOLECYSTOSTOMY  01/27/2017  . IR PERC CHOLECYSTOSTOMY  11/18/2019  . IR RADIOLOGIST EVAL & MGMT  03/05/2017  . IR SINUS/FIST TUBE CHK-NON GI  10/06/2020  . MASTECTOMY, PARTIAL Right     Social History   Socioeconomic History  . Marital status: Widowed    Spouse name: Not on file  . Number of children: Not on file  . Years of education: Not on file  . Highest education level: Not on file  Occupational History  . Not on file  Tobacco Use  . Smoking status: Never Smoker  . Smokeless tobacco: Never Used  Vaping Use  . Vaping Use: Never used  Substance and Sexual Activity  . Alcohol use: No  . Drug use: No  . Sexual activity: Not on file  Other Topics Concern  . Not on file  Social History Narrative  . Not on file   Social Determinants of Health   Financial Resource  Strain: Not on file  Food Insecurity: Not on file  Transportation Needs: Not on file  Physical Activity: Not on file  Stress: Not on file  Social Connections: Not on file  Intimate Partner Violence: Not on file   Family History  Problem Relation Age of Onset  . Cancer Father        ? primary  . Diabetes Neg Hx   . Heart disease Neg Hx   . Stroke Neg Hx       VITAL SIGNS BP 118/68   Pulse 75   Temp 97.8 F (36.6 C)   Resp 18   Ht 5\' 3"  (1.6 m)   Wt 146 lb 6.4 oz (66.4 kg)   BMI 25.93 kg/m   Outpatient Encounter Medications as of 10/20/2020  Medication Sig  . calcium carbonate (TUMS) 500 MG chewable tablet Chew 1 tablet by mouth daily.  . Cholecalciferol 1000 units tablet Take 1,000 Units by mouth daily. hypocalcemia  . Cyanocobalamin (VITAMIN B-12)  1000 MCG SUBL Place 1 tablet under the tongue at bedtime.  . donepezil (ARICEPT) 10 MG tablet Take 1 tablet by mouth at bedtime.  . NON FORMULARY NAS dieT  . Nutritional Supplements (ENSURE ENLIVE PO) Take 1.5 kcal by mouth 2 (two) times daily between meals.  . Nutritional Supplements (NUTRITIONAL SUPPLEMENT PO) Take 1 each by mouth at bedtime. Magic Cup  . polyethylene glycol (MIRALAX / GLYCOLAX) packet Take 17 g by mouth daily as needed for mild constipation or moderate constipation.   . sodium chloride irrigation 0.9 % irrigation Irrigate with 5 mLs as directed daily. Special Instructions: flush cholecystostomy daily  . valACYclovir (VALTREX) 500 MG tablet Take 500 mg by mouth daily. Special Instructions: HSV suppression   No facility-administered encounter medications on file as of 10/20/2020.     SIGNIFICANT DIAGNOSTIC EXAMS  PREVIOUS;   08-26-19: ct of abdomen  1. Interval removal of the previously seen percutaneous cholecystostomy tube. Cholelithiasis with findings concerning for acute on chronic cholecystitis. Clinical correlation is recommended. Ultrasound may provide better evaluation of the gallbladder. 2. Diffuse colonic diverticulosis. No bowel obstruction. 3. Aortic Atherosclerosis   11-13-19: ct of abdomen and pelvis:  1. Persistently thickened gallbladder wall with numerous gallstones, including one at the level of the gallbladder neck. Surrounding pericholecystic fat stranding without a well-defined fluid collection or evidence of abscess. Findings are concerning for acute on chronic cholecystitis. Consider nuclear medicine hepatobiliary scan to evaluate patency of the cystic and common bile ducts. 2. Extensive colonic diverticulosis without evidence of diverticulitis.  11-16-19: chest x-ray: Very mild right basilar linear atelectasis.   11-16-19: abdominal ultrasound: Acute cholecystitis.   11-18-19: chole tube placement: Successful ultrasound and fluoroscopic 10 French  cholecystostomy   NO NEW EXAMS.    LABS REVIEWED: PREVIOUS:   11-16-19: wbc 10.3; hgb 12.1; hct 39.3 mcv 98.5 plt 233; glucose 122; bun 31; creat 1.05; k 3.7 na++ 138; ca 8.5 ast 103 alt 81; albumin 3.0 1-28--21: wbc 12.3; hgb 11.2; hct 35.2; mcv 95.4 plt 265; glucose 88; bun 20; creat 0.95 ;k+ 3.7; na++ 148; ca 8.5 liver normal albumin 2.5 mag 2.3 11-21-19: wbc 8.9; hgb 10.5; hct 33.2; mcv 96.5 plt 265; glucose 95; bun 12; creat 0.86; k+ 3.5; na++ 140; ca 8.1 mag 1.9  12-15-19: glucose 88; bun 14; creat 0.84; k+ 4.2; na++ 139; ca 8.6 05-05-20: wbc 5.0; hgb 13.0; hct 40.5 mcv 96.4 plt 253; glucose 86; bun 16; creat 0.90; k+ 4.0; na++ 137; ca 8.9 liver  normal albumin 3.6   NO NEW LABS.   Review of Systems  Constitutional: Negative for malaise/fatigue.  Respiratory: Negative for cough and shortness of breath.   Cardiovascular: Negative for chest pain, palpitations and leg swelling.  Gastrointestinal: Negative for abdominal pain, constipation and heartburn.  Musculoskeletal: Negative for back pain, joint pain and myalgias.  Skin: Negative.   Neurological: Negative for dizziness.  Psychiatric/Behavioral: The patient is not nervous/anxious.       Physical Exam Constitutional:      General: She is not in acute distress.    Appearance: She is well-developed and well-nourished. She is not diaphoretic.  Neck:     Thyroid: No thyromegaly.  Cardiovascular:     Rate and Rhythm: Normal rate and regular rhythm.     Pulses: Intact distal pulses.     Heart sounds: Normal heart sounds.  Pulmonary:     Effort: Pulmonary effort is normal. No respiratory distress.     Breath sounds: Normal breath sounds.  Chest:     Comments: History of right partial mastectomy Abdominal:     General: Bowel sounds are normal. There is no distension.     Palpations: Abdomen is soft.     Tenderness: There is no abdominal tenderness.     Comments: Chole drain  present      Musculoskeletal:        General: No  edema. Normal range of motion.     Right lower leg: No edema.     Left lower leg: No edema.  Lymphadenopathy:     Cervical: No cervical adenopathy.  Skin:    General: Skin is warm and dry.  Neurological:     Mental Status: She is alert. Mental status is at baseline.  Psychiatric:        Mood and Affect: Mood and affect normal.        ASSESSMENT/ PLAN:  TODAY;   1. Calculus of gall bladder with acute cholecystitis and obstruction: has chole drain; last changed 10-06-20.   2. Dementia without behavioral disturbance unspecified dementia type: is without change weight is stable at 146 pounds will monitor   3. Essential hypertension: is stable b/p 118/68 will monitor    PREVIOUS   4. Chronic constipation: is stable will continue miralax daily as needed  5. Malignant neoplasm of right female breast unspecified estrogen receptor site of breast: history of partial mastectomy will monitor   6. Aortic atherosclerosis (CT 08-26-19) will monitor      MD is aware of resident's narcotic use and is in agreement with current plan of care. We will attempt to wean resident as appropriate.  Ok Edwards NP [facility]  Contact [phone] Monday through Friday 8am- 5pm  After hours call [phone]

## 2020-10-24 DIAGNOSIS — Z1159 Encounter for screening for other viral diseases: Secondary | ICD-10-CM | POA: Diagnosis not present

## 2020-10-25 ENCOUNTER — Inpatient Hospital Stay (HOSPITAL_COMMUNITY): Admit: 2020-10-25 | Payer: Medicare Other

## 2020-11-02 DIAGNOSIS — Z1159 Encounter for screening for other viral diseases: Secondary | ICD-10-CM | POA: Diagnosis not present

## 2020-11-08 DIAGNOSIS — I739 Peripheral vascular disease, unspecified: Secondary | ICD-10-CM | POA: Diagnosis not present

## 2020-11-08 DIAGNOSIS — L602 Onychogryphosis: Secondary | ICD-10-CM | POA: Diagnosis not present

## 2020-11-09 DIAGNOSIS — Z1159 Encounter for screening for other viral diseases: Secondary | ICD-10-CM | POA: Diagnosis not present

## 2020-11-10 ENCOUNTER — Encounter: Payer: Self-pay | Admitting: Adult Health

## 2020-11-10 ENCOUNTER — Non-Acute Institutional Stay (SKILLED_NURSING_FACILITY): Payer: Medicare Other | Admitting: Adult Health

## 2020-11-10 DIAGNOSIS — I700 Atherosclerosis of aorta: Secondary | ICD-10-CM | POA: Diagnosis not present

## 2020-11-10 DIAGNOSIS — F0391 Unspecified dementia with behavioral disturbance: Secondary | ICD-10-CM | POA: Diagnosis not present

## 2020-11-10 DIAGNOSIS — C50911 Malignant neoplasm of unspecified site of right female breast: Secondary | ICD-10-CM

## 2020-11-10 NOTE — Progress Notes (Signed)
Location:  [facility] Room Number: 141/D Place of Service:  SNF (31)   CODE STATUS: Full Code  No Known Allergies  Chief Complaint  Patient presents with  . Acute Visit    Care Plan Meeting     HPI:  We have come together for her care plan meeting. BIMS 6/15 mood 0/30. She requires limited assistance with her adls. She does feed herself. She is frequently incontinent of bladder and bowel. She continues to have a chole tube which is due to changed on 12-01-20. She has not had any recent falls. Her weight is stable at 141 pounds. Her appetite is variable. There are no reports of uncontrolled pain. She continues to be followed for her chronic illnesses including: Dementia without behavioral disturbance unspecified dementia type Malignant neoplasm of right beast unspecified estrogen receptor status; unspecified site of breast Aortic atherosclerosis  Past Medical History:  Diagnosis Date  . Breast cancer ([place])   . Cognitive communication deficit   . Dementia ([place])   . Difficulty in walking(719.7)   . Fall   . HTN (hypertension) 08/26/2016  . Muscle weakness (generalized)   . Rash and nonspecific skin eruption   . Thyroid disease    hypo  . Urinary tract infection, site not specified   . Vitamin B deficiency     Past Surgical History:  Procedure Laterality Date  . COLON SURGERY    . IR CATHETER TUBE CHANGE  05/07/2017  . IR EXCHANGE BILIARY DRAIN  07/02/2017  . IR EXCHANGE BILIARY DRAIN  07/10/2017  . IR EXCHANGE BILIARY DRAIN  08/28/2017  . IR EXCHANGE BILIARY DRAIN  10/11/2017  . IR EXCHANGE BILIARY DRAIN  10/28/2017  . IR EXCHANGE BILIARY DRAIN  12/23/2017  . IR EXCHANGE BILIARY DRAIN  01/23/2018  . IR EXCHANGE BILIARY DRAIN  02/17/2018  . IR EXCHANGE BILIARY DRAIN  03/20/2018  . IR EXCHANGE BILIARY DRAIN  04/17/2018  . IR EXCHANGE BILIARY DRAIN  05/29/2018  . IR EXCHANGE BILIARY DRAIN  07/24/2018  . IR EXCHANGE BILIARY DRAIN  09/22/2018  . IR EXCHANGE BILIARY  DRAIN  11/20/2018  . IR EXCHANGE BILIARY DRAIN  02/11/2019  . IR EXCHANGE BILIARY DRAIN  04/14/2019  . IR EXCHANGE BILIARY DRAIN  06/09/2019  . IR EXCHANGE BILIARY DRAIN  11/26/2019  . IR EXCHANGE BILIARY DRAIN  12/23/2019  . IR EXCHANGE BILIARY DRAIN  02/22/2020  . IR EXCHANGE BILIARY DRAIN  05/25/2020  . IR EXCHANGE BILIARY DRAIN  07/29/2020  . IR EXCHANGE BILIARY DRAIN  08/30/2020  . IR EXCHANGE BILIARY DRAIN  10/06/2020  . IR PERC CHOLECYSTOSTOMY  01/27/2017  . IR PERC CHOLECYSTOSTOMY  11/18/2019  . IR RADIOLOGIST EVAL & MGMT  03/05/2017  . IR SINUS/FIST TUBE CHK-NON GI  10/06/2020  . MASTECTOMY, PARTIAL Right     Social History   Socioeconomic History  . Marital status: Widowed    Spouse name: Not on file  . Number of children: Not on file  . Years of education: Not on file  . Highest education level: Not on file  Occupational History  . Not on file  Tobacco Use  . Smoking status: Never Smoker  . Smokeless tobacco: Never Used  Vaping Use  . Vaping Use: Never used  Substance and Sexual Activity  . Alcohol use: No  . Drug use: No  . Sexual activity: Not on file  Other Topics Concern  . Not on file  Social History Narrative  . Not on  file   Social Determinants of Health   Financial Resource Strain: Not on file  Food Insecurity: Not on file  Transportation Needs: Not on file  Physical Activity: Not on file  Stress: Not on file  Social Connections: Not on file  Intimate Partner Violence: Not on file   Family History  Problem Relation Age of Onset  . Cancer Father        ? primary  . Diabetes Neg Hx   . Heart disease Neg Hx   . Stroke Neg Hx       VITAL SIGNS BP (!) 104/52   Pulse 90   Temp 98.1 F (36.7 C)   Resp 18   Ht 5\' 3"  (1.6 m)   Wt 141 lb 6.4 oz (64.1 kg)   SpO2 93%   BMI 25.05 kg/m   Outpatient Encounter Medications as of 11/10/2020  Medication Sig  . calcium carbonate (TUMS - DOSED IN MG ELEMENTAL CALCIUM) 500 MG chewable tablet Chew 1 tablet by  mouth daily.  . Cholecalciferol 1000 units tablet Take 1,000 Units by mouth daily. hypocalcemia  . Cyanocobalamin (VITAMIN B-12) 1000 MCG SUBL Place 1 tablet under the tongue at bedtime.  . donepezil (ARICEPT) 10 MG tablet Take 1 tablet by mouth at bedtime.  . NON FORMULARY NAS dieT  . Nutritional Supplements (ENSURE ENLIVE PO) Take 1.5 kcal by mouth 2 (two) times daily between meals.  . Nutritional Supplements (NUTRITIONAL SUPPLEMENT PO) Take 1 each by mouth at bedtime. Magic Cup  . polyethylene glycol (MIRALAX / GLYCOLAX) packet Take 17 g by mouth daily as needed for mild constipation or moderate constipation.   . sodium chloride irrigation 0.9 % irrigation Irrigate with 5 mLs as directed daily. Special Instructions: flush cholecystostomy daily  . valACYclovir (VALTREX) 500 MG tablet Take 500 mg by mouth daily. Special Instructions: HSV suppression   No facility-administered encounter medications on file as of 11/10/2020.     SIGNIFICANT DIAGNOSTIC EXAMS  PREVIOUS;   08-26-19: ct of abdomen  1. Interval removal of the previously seen percutaneous cholecystostomy tube. Cholelithiasis with findings concerning for acute on chronic cholecystitis. Clinical correlation is recommended. Ultrasound may provide better evaluation of the gallbladder. 2. Diffuse colonic diverticulosis. No bowel obstruction. 3. Aortic Atherosclerosis   11-13-19: ct of abdomen and pelvis:  1. Persistently thickened gallbladder wall with numerous gallstones, including one at the level of the gallbladder neck. Surrounding pericholecystic fat stranding without a well-defined fluid collection or evidence of abscess. Findings are concerning for acute on chronic cholecystitis. Consider nuclear medicine hepatobiliary scan to evaluate patency of the cystic and common bile ducts. 2. Extensive colonic diverticulosis without evidence of diverticulitis.  11-16-19: chest x-ray: Very mild right basilar linear atelectasis.   11-16-19:  abdominal ultrasound: Acute cholecystitis.   11-18-19: chole tube placement: Successful ultrasound and fluoroscopic 10 French cholecystostomy   NO NEW EXAMS.    LABS REVIEWED: PREVIOUS:   11-16-19: wbc 10.3; hgb 12.1; hct 39.3 mcv 98.5 plt 233; glucose 122; bun 31; creat 1.05; k 3.7 na++ 138; ca 8.5 ast 103 alt 81; albumin 3.0 1-28--21: wbc 12.3; hgb 11.2; hct 35.2; mcv 95.4 plt 265; glucose 88; bun 20; creat 0.95 ;k+ 3.7; na++ 148; ca 8.5 liver normal albumin 2.5 mag 2.3 11-21-19: wbc 8.9; hgb 10.5; hct 33.2; mcv 96.5 plt 265; glucose 95; bun 12; creat 0.86; k+ 3.5; na++ 140; ca 8.1 mag 1.9  12-15-19: glucose 88; bun 14; creat 0.84; k+ 4.2; na++ 139; ca 8.6 05-05-20: wbc  5.0; hgb 13.0; hct 40.5 mcv 96.4 plt 253; glucose 86; bun 16; creat 0.90; k+ 4.0; na++ 137; ca 8.9 liver normal albumin 3.6   NO NEW LABS.   Review of Systems  Constitutional: Negative for malaise/fatigue.  Respiratory: Negative for cough and shortness of breath.   Cardiovascular: Negative for chest pain, palpitations and leg swelling.  Gastrointestinal: Negative for abdominal pain, constipation and heartburn.  Musculoskeletal: Negative for back pain, joint pain and myalgias.  Skin: Negative.   Neurological: Negative for dizziness.  Psychiatric/Behavioral: The patient is not nervous/anxious.    Physical Exam Constitutional:      General: She is not in acute distress.    Appearance: She is well-developed and well-nourished. She is not diaphoretic.  Neck:     Thyroid: No thyromegaly.  Cardiovascular:     Rate and Rhythm: Normal rate and regular rhythm.     Pulses: Intact distal pulses.     Heart sounds: Normal heart sounds.  Pulmonary:     Effort: Pulmonary effort is normal. No respiratory distress.     Breath sounds: Normal breath sounds.  Chest:     Comments: History of right partial mastectomy Abdominal:     General: Bowel sounds are normal. There is no distension.     Palpations: Abdomen is soft.      Tenderness: There is no abdominal tenderness.     Comments: Chole drain  present       Musculoskeletal:        General: No edema. Normal range of motion.  Lymphadenopathy:     Cervical: No cervical adenopathy.  Skin:    General: Skin is warm and dry.  Neurological:     Mental Status: She is alert. Mental status is at baseline.  Psychiatric:        Mood and Affect: Mood and affect and mood normal.       ASSESSMENT/ PLAN:  TODAY  1. Dementia without behavioral disturbance unspecified dementia type 2. Malignant neoplasm of right beast unspecified estrogen receptor status; unspecified site of breast 3. Aortic atherosclerosis ( ct 11-13-19)  Will continue current medications Will continue current plan of care Will continue to monitor her status.     MD is aware of resident's narcotic use and is in agreement with current plan of care. We will attempt to wean resident as appropriate.  Ok Edwards NP [facility]  Contact [phone] Monday through Friday 8am- 5pm  After hours call [phone]

## 2020-11-11 DIAGNOSIS — Z1159 Encounter for screening for other viral diseases: Secondary | ICD-10-CM | POA: Diagnosis not present

## 2020-11-14 DIAGNOSIS — Z1159 Encounter for screening for other viral diseases: Secondary | ICD-10-CM | POA: Diagnosis not present

## 2020-11-15 DIAGNOSIS — I700 Atherosclerosis of aorta: Secondary | ICD-10-CM | POA: Insufficient documentation

## 2020-11-15 HISTORY — DX: Atherosclerosis of aorta: I70.0

## 2020-11-16 ENCOUNTER — Ambulatory Visit (HOSPITAL_COMMUNITY): Admit: 2020-11-16 | Payer: Medicare Other

## 2020-11-16 DIAGNOSIS — Z1159 Encounter for screening for other viral diseases: Secondary | ICD-10-CM | POA: Diagnosis not present

## 2020-11-17 ENCOUNTER — Other Ambulatory Visit: Payer: Self-pay | Admitting: Radiology

## 2020-11-18 ENCOUNTER — Inpatient Hospital Stay (HOSPITAL_COMMUNITY)
Admission: RE | Admit: 2020-11-18 | Discharge: 2020-11-18 | Disposition: A | Discharge status: Home / Self Care | Payer: Medicare Other | Source: Ambulatory Visit | Attending: Interventional Radiology | Admitting: Interventional Radiology

## 2020-11-18 DIAGNOSIS — Z1159 Encounter for screening for other viral diseases: Secondary | ICD-10-CM | POA: Diagnosis not present

## 2020-11-18 DIAGNOSIS — Z434 Encounter for attention to other artificial openings of digestive tract: Secondary | ICD-10-CM | POA: Diagnosis not present

## 2020-11-18 HISTORY — PX: IR EXCHANGE BILIARY DRAIN: IMG6046

## 2020-11-18 MED ORDER — LIDOCAINE HCL 1 % IJ SOLN
INTRAMUSCULAR | Status: DC | PRN
  Administered 2020-11-18: 10 mL

## 2020-11-18 MED ORDER — IOHEXOL 300 MG/ML  SOLN
50.0000 mL | Freq: Once | INTRAMUSCULAR | Status: AC | PRN
  Administered 2020-11-18: 10 mL

## 2020-11-18 MED ORDER — LIDOCAINE HCL 1 % IJ SOLN
INTRAMUSCULAR | Status: AC
  Filled 2020-11-18: qty 20

## 2020-11-18 NOTE — Procedures (Signed)
Interventional Radiology Procedure Note  Procedure: Exchange of a perc chole drain, new 77F drain.   Complications: None  Recommendations:  - Gravity drain - routine drain care - Do not submerge    Signed,  Dulcy Fanny. Earleen Newport, DO

## 2020-11-23 DIAGNOSIS — Z1159 Encounter for screening for other viral diseases: Secondary | ICD-10-CM | POA: Diagnosis not present

## 2020-11-25 DIAGNOSIS — Z1159 Encounter for screening for other viral diseases: Secondary | ICD-10-CM | POA: Diagnosis not present

## 2020-11-28 DIAGNOSIS — Z1159 Encounter for screening for other viral diseases: Secondary | ICD-10-CM | POA: Diagnosis not present

## 2020-11-30 DIAGNOSIS — Z1159 Encounter for screening for other viral diseases: Secondary | ICD-10-CM | POA: Diagnosis not present

## 2020-12-01 ENCOUNTER — Inpatient Hospital Stay (HOSPITAL_COMMUNITY)
Admit: 2020-12-01 | Discharge: 2020-12-01 | Disposition: A | Discharge status: Home / Self Care | Payer: Medicare Other | Attending: Interventional Radiology | Admitting: Interventional Radiology

## 2020-12-02 DIAGNOSIS — Z1159 Encounter for screening for other viral diseases: Secondary | ICD-10-CM | POA: Diagnosis not present

## 2020-12-05 DIAGNOSIS — Z1159 Encounter for screening for other viral diseases: Secondary | ICD-10-CM | POA: Diagnosis not present

## 2020-12-06 NOTE — Addendum Note (Signed)
Encounter addended by: Riley Churches on: 12/06/2020 12:07 PM  Actions taken: Imaging Exam ended

## 2020-12-07 DIAGNOSIS — Z1159 Encounter for screening for other viral diseases: Secondary | ICD-10-CM | POA: Diagnosis not present

## 2020-12-16 ENCOUNTER — Non-Acute Institutional Stay (SKILLED_NURSING_FACILITY): Payer: Medicare Other | Admitting: Adult Health

## 2020-12-16 DIAGNOSIS — I10 Essential (primary) hypertension: Secondary | ICD-10-CM | POA: Diagnosis not present

## 2020-12-16 DIAGNOSIS — Z434 Encounter for attention to other artificial openings of digestive tract: Secondary | ICD-10-CM

## 2020-12-16 DIAGNOSIS — C50911 Malignant neoplasm of unspecified site of right female breast: Secondary | ICD-10-CM

## 2020-12-16 DIAGNOSIS — K5909 Other constipation: Secondary | ICD-10-CM

## 2020-12-16 DIAGNOSIS — K8012 Calculus of gallbladder with acute and chronic cholecystitis without obstruction: Secondary | ICD-10-CM | POA: Diagnosis not present

## 2020-12-16 DIAGNOSIS — I700 Atherosclerosis of aorta: Secondary | ICD-10-CM

## 2020-12-16 DIAGNOSIS — F0391 Unspecified dementia with behavioral disturbance: Secondary | ICD-10-CM

## 2020-12-16 NOTE — Progress Notes (Signed)
Provider: SNF Location  [facility]  PCP: [name], NP   Extended Emergency Contact Information Primary Emergency Contact: Dorff,[name]  [place] Phone: [phone] Relation: Daughter  Codes status: DNR Goals of care: advanced directive information Advanced Directives 11/10/2020  Does Patient Have a Medical Advance Directive? No  Type of Advance Directive -  Does patient want to make changes to medical advance directive? No - Patient declined  Copy of [place] in Chart? -  Would patient like information on creating a medical advance directive? -     No Known Allergies  Chief Complaint  Patient presents with  . Annual Exam    HPI  She is being seen for her annual exam. She has not been hospitalized over the past year. She continues to have a bili drain which requires routine exchanges. She continues to spend nearly all of her time in her room; she does get out of bed daily. Her weight has not had a significant change. She denies any pain. She denies any insomnia. There are no reports of being agitated. She continues to be followed for her chronic illnesses including: Chronic constipation:  Malignant neoplasm of right female breast unspecified estrogen receptor site of breast:   Aortic atherosclerosis   Past Medical History:  Diagnosis Date  . Breast cancer ([place])   . Cognitive communication deficit   . Dementia ([place])   . Difficulty in walking(719.7)   . Fall   . HTN (hypertension) 08/26/2016  . Muscle weakness (generalized)   . Rash and nonspecific skin eruption   . Thyroid disease    hypo  . Urinary tract infection, site not specified   . Vitamin B deficiency    Past Surgical History:  Procedure Laterality Date  . COLON SURGERY    . IR CATHETER TUBE CHANGE  05/07/2017  . IR EXCHANGE BILIARY DRAIN  07/02/2017  . IR EXCHANGE BILIARY DRAIN  07/10/2017  . IR EXCHANGE BILIARY DRAIN  08/28/2017  . IR  EXCHANGE BILIARY DRAIN  10/11/2017  . IR EXCHANGE BILIARY DRAIN  10/28/2017  . IR EXCHANGE BILIARY DRAIN  12/23/2017  . IR EXCHANGE BILIARY DRAIN  01/23/2018  . IR EXCHANGE BILIARY DRAIN  02/17/2018  . IR EXCHANGE BILIARY DRAIN  03/20/2018  . IR EXCHANGE BILIARY DRAIN  04/17/2018  . IR EXCHANGE BILIARY DRAIN  05/29/2018  . IR EXCHANGE BILIARY DRAIN  07/24/2018  . IR EXCHANGE BILIARY DRAIN  09/22/2018  . IR EXCHANGE BILIARY DRAIN  11/20/2018  . IR EXCHANGE BILIARY DRAIN  02/11/2019  . IR EXCHANGE BILIARY DRAIN  04/14/2019  . IR EXCHANGE BILIARY DRAIN  06/09/2019  . IR EXCHANGE BILIARY DRAIN  11/26/2019  . IR EXCHANGE BILIARY DRAIN  12/23/2019  . IR EXCHANGE BILIARY DRAIN  02/22/2020  . IR EXCHANGE BILIARY DRAIN  05/25/2020  . IR EXCHANGE BILIARY DRAIN  07/29/2020  . IR EXCHANGE BILIARY DRAIN  08/30/2020  . IR EXCHANGE BILIARY DRAIN  10/06/2020  . IR EXCHANGE BILIARY DRAIN  11/18/2020  . IR PERC CHOLECYSTOSTOMY  01/27/2017  . IR PERC CHOLECYSTOSTOMY  11/18/2019  . IR RADIOLOGIST EVAL & MGMT  03/05/2017  . IR SINUS/FIST TUBE CHK-NON GI  10/06/2020  . MASTECTOMY, PARTIAL Right     reports that she has never smoked. She has never used smokeless tobacco. She reports that she does not drink alcohol and does not use drugs. Social History   Tobacco Use  . Smoking status: Never  Smoker  . Smokeless tobacco: Never Used  Vaping Use  . Vaping Use: Never used  Substance Use Topics  . Alcohol use: No  . Drug use: No   Family History  Problem Relation Age of Onset  . Cancer Father        ? primary  . Diabetes Neg Hx   . Heart disease Neg Hx   . Stroke Neg Hx     Pertinent  Health Maintenance Due  Topic Date Due  . INFLUENZA VACCINE  Completed  . DEXA SCAN  Completed  . PNA vac Low Risk Adult  Completed   Fall Risk  06/06/2020 09/29/2019 07/21/2018 07/11/2017  Falls in the past year? 1 0 No No  Number falls in past yr: 0 0 - -  Injury with Fall? 0 0 - -  Risk for fall due to : History of fall(s);Impaired  balance/gait;Impaired mobility - - -  Follow up Falls evaluation completed - - -   Depression screen [facility] 2/9 06/06/2020 09/29/2019 07/21/2018 07/11/2017  Decreased Interest 0 0 0 0  Down, Depressed, Hopeless 0 0 0 2  PHQ - 2 Score 0 0 0 2  Altered sleeping - - - 0  Tired, decreased energy - - - 0  Change in appetite - - - 1  Feeling bad or failure about yourself  - - - 0  Trouble concentrating - - - 0  Moving slowly or fidgety/restless - - - 0  Suicidal thoughts - - - 0  PHQ-9 Score - - - 3  Difficult doing work/chores - - - Somewhat difficult    Functional Status Survey:    Outpatient Encounter Medications as of 12/16/2020  Medication Sig  . calcium carbonate (TUMS - DOSED IN MG ELEMENTAL CALCIUM) 500 MG chewable tablet Chew 1 tablet by mouth daily.  . Cholecalciferol 1000 units tablet Take 1,000 Units by mouth daily. hypocalcemia  . Cyanocobalamin (VITAMIN B-12) 1000 MCG SUBL Place 1 tablet under the tongue at bedtime.  . donepezil (ARICEPT) 10 MG tablet Take 1 tablet by mouth at bedtime.  . NON FORMULARY NAS dieT  . Nutritional Supplements (ENSURE ENLIVE PO) Take 1.5 kcal by mouth 2 (two) times daily between meals.  . Nutritional Supplements (NUTRITIONAL SUPPLEMENT PO) Take 1 each by mouth at bedtime. Magic Cup  . polyethylene glycol (MIRALAX / GLYCOLAX) packet Take 17 g by mouth daily as needed for mild constipation or moderate constipation.   . sodium chloride irrigation 0.9 % irrigation Irrigate with 5 mLs as directed daily. Special Instructions: flush cholecystostomy daily  . valACYclovir (VALTREX) 500 MG tablet Take 500 mg by mouth daily. Special Instructions: HSV suppression   No facility-administered encounter medications on file as of 12/16/2020.     Vitals:   12/16/20 1204  BP: 128/82  Pulse: 70  Temp: 97.6 F (36.4 C)  Weight: 142 lb 12.8 oz (64.8 kg)  Height: 5\' 3"  (1.6 m)   Body mass index is 25.3 kg/m.  DIAGNOSTIC EXAMS    PREVIOUS;   08-26-19: ct of  abdomen  1. Interval removal of the previously seen percutaneous cholecystostomy tube. Cholelithiasis with findings concerning for acute on chronic cholecystitis. Clinical correlation is recommended. Ultrasound may provide better evaluation of the gallbladder. 2. Diffuse colonic diverticulosis. No bowel obstruction. 3. Aortic Atherosclerosis   11-13-19: ct of abdomen and pelvis:  1. Persistently thickened gallbladder wall with numerous gallstones, including one at the level of the gallbladder neck. Surrounding pericholecystic fat stranding without a well-defined  fluid collection or evidence of abscess. Findings are concerning for acute on chronic cholecystitis. Consider nuclear medicine hepatobiliary scan to evaluate patency of the cystic and common bile ducts. 2. Extensive colonic diverticulosis without evidence of diverticulitis.  11-16-19: chest x-ray: Very mild right basilar linear atelectasis.   11-16-19: abdominal ultrasound: Acute cholecystitis.   11-18-19: chole tube placement: Successful ultrasound and fluoroscopic 10 French cholecystostomy   NO NEW EXAMS.    LABS REVIEWED: PREVIOUS:   12-15-19: glucose 88; bun 14; creat 0.84; k+ 4.2; na++ 139; ca 8.6 05-05-20: wbc 5.0; hgb 13.0; hct 40.5 mcv 96.4 plt 253; glucose 86; bun 16; creat 0.90; k+ 4.0; na++ 137; ca 8.9 liver normal albumin 3.6   NO NEW LABS.   Review of Systems  Constitutional: Negative for malaise/fatigue.  Respiratory: Negative for cough and shortness of breath.   Cardiovascular: Negative for chest pain, palpitations and leg swelling.  Gastrointestinal: Negative for abdominal pain, constipation and heartburn.  Musculoskeletal: Negative for back pain, joint pain and myalgias.  Skin: Negative.   Neurological: Negative for dizziness.  Psychiatric/Behavioral: The patient is not nervous/anxious.       Physical Exam Constitutional:      General: She is not in acute distress.    Appearance: She is well-developed and  well-nourished. She is not diaphoretic.  Neck:     Thyroid: No thyromegaly.  Cardiovascular:     Rate and Rhythm: Normal rate and regular rhythm.     Pulses: Intact distal pulses.     Heart sounds: Normal heart sounds.  Pulmonary:     Effort: Pulmonary effort is normal. No respiratory distress.     Breath sounds: Normal breath sounds.  Chest:     Comments:  History of right partial mastectomy Abdominal:     General: Bowel sounds are normal. There is no distension.     Palpations: Abdomen is soft.     Tenderness: There is no abdominal tenderness.     Comments: Chole drain  present        Musculoskeletal:        General: No edema. Normal range of motion.     Right lower leg: No edema.     Left lower leg: No edema.  Lymphadenopathy:     Cervical: No cervical adenopathy.  Skin:    General: Skin is warm and dry.  Neurological:     Mental Status: She is alert. Mental status is at baseline.  Psychiatric:        Mood and Affect: Mood and affect and mood normal.        ASSESSMENT/ PLAN:  TODAY;    1. Chronic constipation: is stable will continue miralax daily as needed  2. Malignant neoplasm of right female breast unspecified estrogen receptor site of breast: history of partial mastectomy   3. Aortic atherosclerosis (ct 08-26-19) will monitor   4. Calculus of gall bladder with acute cholecystitis and obstruction: has chole drain; last changed 10-06-20.   5. Dementia without behavioral disturbance unspecified dementia type: is without change weight is stable at 142 pounds will monitor   6. Essential hypertension: is stable b/p 128/82 will monitor    MD is aware of resident's narcotic use and is in agreement with current plan of care. We will attempt to wean resident as appropriate.  Ok Edwards NP [facility]  Contact [phone] Monday through Friday 8am- 5pm  After hours call [phone]

## 2020-12-21 ENCOUNTER — Ambulatory Visit (HOSPITAL_COMMUNITY)
Admission: RE | Admit: 2020-12-21 | Discharge: 2020-12-21 | Disposition: A | Discharge status: Home / Self Care | Payer: Medicare Other | Source: Ambulatory Visit | Attending: Interventional Radiology | Admitting: Interventional Radiology

## 2020-12-21 DIAGNOSIS — Z434 Encounter for attention to other artificial openings of digestive tract: Secondary | ICD-10-CM | POA: Diagnosis not present

## 2020-12-21 DIAGNOSIS — K819 Cholecystitis, unspecified: Secondary | ICD-10-CM | POA: Diagnosis not present

## 2020-12-21 DIAGNOSIS — K828 Other specified diseases of gallbladder: Secondary | ICD-10-CM | POA: Diagnosis not present

## 2020-12-21 HISTORY — PX: IR EXCHANGE BILIARY DRAIN: IMG6046

## 2020-12-21 MED ORDER — LIDOCAINE HCL (PF) 1 % IJ SOLN
INTRAMUSCULAR | Status: DC | PRN
  Administered 2020-12-21: 30 mL

## 2020-12-21 MED ORDER — IOHEXOL 300 MG/ML  SOLN
50.0000 mL | Freq: Once | INTRAMUSCULAR | Status: AC | PRN
  Administered 2020-12-21: 20 mL

## 2020-12-21 MED ORDER — LIDOCAINE HCL 1 % IJ SOLN
INTRAMUSCULAR | Status: AC
  Filled 2020-12-21: qty 20

## 2020-12-21 NOTE — Procedures (Signed)
Pre procedural Dx: Chronic chole tube. Post procedural Dx: Same  Technically successful Fluoro guided exchange of 12 Fr chole tube. Chole tube connected to gravity bag.   EBL: None Complications: None immediate  Ronny Bacon, MD Pager #: [phone]

## 2021-01-10 DIAGNOSIS — B351 Tinea unguium: Secondary | ICD-10-CM | POA: Diagnosis not present

## 2021-01-10 DIAGNOSIS — M2041 Other hammer toe(s) (acquired), right foot: Secondary | ICD-10-CM | POA: Diagnosis not present

## 2021-01-10 DIAGNOSIS — M2042 Other hammer toe(s) (acquired), left foot: Secondary | ICD-10-CM | POA: Diagnosis not present

## 2021-01-10 DIAGNOSIS — L603 Nail dystrophy: Secondary | ICD-10-CM | POA: Diagnosis not present

## 2021-01-10 DIAGNOSIS — I739 Peripheral vascular disease, unspecified: Secondary | ICD-10-CM | POA: Diagnosis not present

## 2021-01-13 ENCOUNTER — Other Ambulatory Visit (HOSPITAL_COMMUNITY): Payer: Medicare Other

## 2021-01-18 ENCOUNTER — Non-Acute Institutional Stay (SKILLED_NURSING_FACILITY): Payer: Medicare Other | Admitting: Adult Health

## 2021-01-18 ENCOUNTER — Encounter: Payer: Self-pay | Admitting: Adult Health

## 2021-01-18 DIAGNOSIS — I700 Atherosclerosis of aorta: Secondary | ICD-10-CM

## 2021-01-18 DIAGNOSIS — Z853 Personal history of malignant neoplasm of breast: Secondary | ICD-10-CM

## 2021-01-18 DIAGNOSIS — K5909 Other constipation: Secondary | ICD-10-CM | POA: Diagnosis not present

## 2021-01-18 NOTE — Progress Notes (Signed)
Location:  [facility] Room Number: 141/D Place of Service:  SNF (31)   CODE STATUS: Full Code  No Known Allergies  Chief Complaint  Patient presents with  . Medical Management of Chronic Issues            Chronic constipation:   History of breast cancer:  Aortic atherosclerosis    HPI:  She is a 87 year old long term resident of this facility being seen for the management of her chronic illnesses:  Chronic constipation:   History of breast cancer:  Aortic atherosclerosis. There are no reports of uncontrolled pain no reports of changes in appetite; no reports of insomnia or anxiety.   Past Medical History:  Diagnosis Date  . Breast cancer ([place])   . Cognitive communication deficit   . Dementia ([place])   . Difficulty in walking(719.7)   . Fall   . HTN (hypertension) 08/26/2016  . Muscle weakness (generalized)   . Rash and nonspecific skin eruption   . Thyroid disease    hypo  . Urinary tract infection, site not specified   . Vitamin B deficiency     Past Surgical History:  Procedure Laterality Date  . COLON SURGERY    . IR CATHETER TUBE CHANGE  05/07/2017  . IR EXCHANGE BILIARY DRAIN  07/02/2017  . IR EXCHANGE BILIARY DRAIN  07/10/2017  . IR EXCHANGE BILIARY DRAIN  08/28/2017  . IR EXCHANGE BILIARY DRAIN  10/11/2017  . IR EXCHANGE BILIARY DRAIN  10/28/2017  . IR EXCHANGE BILIARY DRAIN  12/23/2017  . IR EXCHANGE BILIARY DRAIN  01/23/2018  . IR EXCHANGE BILIARY DRAIN  02/17/2018  . IR EXCHANGE BILIARY DRAIN  03/20/2018  . IR EXCHANGE BILIARY DRAIN  04/17/2018  . IR EXCHANGE BILIARY DRAIN  05/29/2018  . IR EXCHANGE BILIARY DRAIN  07/24/2018  . IR EXCHANGE BILIARY DRAIN  09/22/2018  . IR EXCHANGE BILIARY DRAIN  11/20/2018  . IR EXCHANGE BILIARY DRAIN  02/11/2019  . IR EXCHANGE BILIARY DRAIN  04/14/2019  . IR EXCHANGE BILIARY DRAIN  06/09/2019  . IR EXCHANGE BILIARY DRAIN  11/26/2019  . IR EXCHANGE BILIARY DRAIN  12/23/2019  . IR EXCHANGE BILIARY DRAIN  02/22/2020  . IR  EXCHANGE BILIARY DRAIN  05/25/2020  . IR EXCHANGE BILIARY DRAIN  07/29/2020  . IR EXCHANGE BILIARY DRAIN  08/30/2020  . IR EXCHANGE BILIARY DRAIN  10/06/2020  . IR EXCHANGE BILIARY DRAIN  11/18/2020  . IR EXCHANGE BILIARY DRAIN  12/21/2020  . IR PERC CHOLECYSTOSTOMY  01/27/2017  . IR PERC CHOLECYSTOSTOMY  11/18/2019  . IR RADIOLOGIST EVAL & MGMT  03/05/2017  . IR SINUS/FIST TUBE CHK-NON GI  10/06/2020  . MASTECTOMY, PARTIAL Right     Social History   Socioeconomic History  . Marital status: Widowed    Spouse name: Not on file  . Number of children: Not on file  . Years of education: Not on file  . Highest education level: Not on file  Occupational History  . Not on file  Tobacco Use  . Smoking status: Never Smoker  . Smokeless tobacco: Never Used  Vaping Use  . Vaping Use: Never used  Substance and Sexual Activity  . Alcohol use: No  . Drug use: No  . Sexual activity: Not on file  Other Topics Concern  . Not on file  Social History Narrative  . Not on file   Social Determinants of Health   Financial Resource Strain: Not on file  Food Insecurity: Not on file  Transportation Needs: Not on file  Physical Activity: Not on file  Stress: Not on file  Social Connections: Not on file  Intimate Partner Violence: Not on file   Family History  Problem Relation Age of Onset  . Cancer Father        ? primary  . Diabetes Neg Hx   . Heart disease Neg Hx   . Stroke Neg Hx       VITAL SIGNS BP 124/69   Pulse 82   Temp (!) 97.3 F (36.3 C)   Ht 5\' 3"  (1.6 m)   Wt 141 lb 9.6 oz (64.2 kg)   BMI 25.08 kg/m   Outpatient Encounter Medications as of 01/18/2021  Medication Sig  . calcium carbonate (TUMS - DOSED IN MG ELEMENTAL CALCIUM) 500 MG chewable tablet Chew 1 tablet by mouth daily.  . Cholecalciferol 1000 units tablet Take 1,000 Units by mouth daily. hypocalcemia  . Cyanocobalamin (VITAMIN B-12) 1000 MCG SUBL Place 1 tablet under the tongue at bedtime.  . donepezil (ARICEPT)  10 MG tablet Take 1 tablet by mouth at bedtime.  . NON FORMULARY NAS dieT  . Nutritional Supplements (ENSURE ENLIVE PO) Take 1.5 kcal by mouth 2 (two) times daily between meals.  . Nutritional Supplements (NUTRITIONAL SUPPLEMENT PO) Take 1 each by mouth at bedtime. Magic Cup  . polyethylene glycol (MIRALAX / GLYCOLAX) packet Take 17 g by mouth daily as needed for mild constipation or moderate constipation.   . sodium chloride irrigation 0.9 % irrigation Irrigate with 5 mLs as directed daily. Special Instructions: flush cholecystostomy daily  . valACYclovir (VALTREX) 500 MG tablet Take 500 mg by mouth daily. Special Instructions: HSV suppression   No facility-administered encounter medications on file as of 01/18/2021.     SIGNIFICANT DIAGNOSTIC EXAMS   PREVIOUS;   08-26-19: ct of abdomen  1. Interval removal of the previously seen percutaneous cholecystostomy tube. Cholelithiasis with findings concerning for acute on chronic cholecystitis. Clinical correlation is recommended. Ultrasound may provide better evaluation of the gallbladder. 2. Diffuse colonic diverticulosis. No bowel obstruction. 3. Aortic Atherosclerosis   11-13-19: ct of abdomen and pelvis:  1. Persistently thickened gallbladder wall with numerous gallstones, including one at the level of the gallbladder neck. Surrounding pericholecystic fat stranding without a well-defined fluid collection or evidence of abscess. Findings are concerning for acute on chronic cholecystitis. Consider nuclear medicine hepatobiliary scan to evaluate patency of the cystic and common bile ducts. 2. Extensive colonic diverticulosis without evidence of diverticulitis.  NO NEW EXAMS.    LABS REVIEWED: PREVIOUS:   05-05-20: wbc 5.0; hgb 13.0; hct 40.5 mcv 96.4 plt 253; glucose 86; bun 16; creat 0.90; k+ 4.0; na++ 137; ca 8.9 liver normal albumin 3.6   NO NEW LABS.   Review of Systems  Constitutional: Negative for malaise/fatigue.  Respiratory:  Negative for cough and shortness of breath.   Cardiovascular: Negative for chest pain, palpitations and leg swelling.  Gastrointestinal: Negative for abdominal pain, constipation and heartburn.  Musculoskeletal: Negative for back pain, joint pain and myalgias.  Skin: Negative.   Neurological: Negative for dizziness.  Psychiatric/Behavioral: The patient is not nervous/anxious.     Physical Exam Constitutional:      General: She is not in acute distress.    Appearance: She is well-developed. She is not diaphoretic.  Neck:     Thyroid: No thyromegaly.  Cardiovascular:     Rate and Rhythm: Normal rate and regular rhythm.  Pulses: Normal pulses.     Heart sounds: Normal heart sounds.  Pulmonary:     Effort: Pulmonary effort is normal. No respiratory distress.     Breath sounds: Normal breath sounds.  Chest:     Comments: History of right partial mastectomy Abdominal:     General: Bowel sounds are normal. There is no distension.     Palpations: Abdomen is soft.     Tenderness: There is no abdominal tenderness.     Comments: Chole drainage in place   Musculoskeletal:        General: Normal range of motion.     Cervical back: Neck supple.     Right lower leg: No edema.     Left lower leg: No edema.  Lymphadenopathy:     Cervical: No cervical adenopathy.  Skin:    General: Skin is warm and dry.  Neurological:     Mental Status: She is alert. Mental status is at baseline.  Psychiatric:        Mood and Affect: Mood normal.           ASSESSMENT/ PLAN:  TODAY;   1. Chronic constipation: is stable will continue miralax daily as needed  2. History of breast cancer:  Is status post right partial mastectomy   3. Aortic atherosclerosis: (ct 08-26-19) will monitor   Will check cbc; cmp; mag tsh    PREVIOUS   4. Calculus of gall bladder with acute cholecystitis and obstruction: has chole drain; last changed 10-06-20.   5. Dementia without behavioral disturbance  unspecified dementia type: is without change weight is stable at 141 pounds will monitor   6. Essential hypertension: is stable b/p 124/69 will monitor      Ok Edwards NP [facility]  Contact [phone] Monday through Friday 8am- 5pm  After hours call [phone]

## 2021-01-20 DIAGNOSIS — Z853 Personal history of malignant neoplasm of breast: Secondary | ICD-10-CM | POA: Insufficient documentation

## 2021-01-23 ENCOUNTER — Other Ambulatory Visit (HOSPITAL_COMMUNITY)
Admission: RE | Admit: 2021-01-23 | Discharge: 2021-01-23 | Disposition: A | Discharge status: Home / Self Care | Payer: Medicare Other | Source: Skilled Nursing Facility | Attending: Adult Health | Admitting: Adult Health

## 2021-01-23 DIAGNOSIS — K8012 Calculus of gallbladder with acute and chronic cholecystitis without obstruction: Secondary | ICD-10-CM | POA: Diagnosis not present

## 2021-01-23 LAB — COMPREHENSIVE METABOLIC PANEL
ALT: 15 U/L (ref 0–44)
AST: 15 U/L (ref 15–41)
Albumin: 3.2 g/dL — ABNORMAL LOW (ref 3.5–5.0)
Alkaline Phosphatase: 74 U/L (ref 38–126)
Anion gap: 9 (ref 5–15)
BUN: 16 mg/dL (ref 8–23)
CO2: 28 mmol/L (ref 22–32)
Calcium: 9 mg/dL (ref 8.9–10.3)
Chloride: 104 mmol/L (ref 98–111)
Creatinine, Ser: 0.86 mg/dL (ref 0.44–1.00)
GFR, Estimated: >60 mL/min (ref >60)
Glucose, Bld: 89 mg/dL (ref 70–99)
Potassium: 4.4 mmol/L (ref 3.5–5.1)
Sodium: 141 mmol/L (ref 135–145)
Total Bilirubin: 0.6 mg/dL (ref 0.3–1.2)
Total Protein: 6.8 g/dL (ref 6.5–8.1)

## 2021-01-23 LAB — CBC
HCT: 39.3 % (ref 36.0–46.0)
Hemoglobin: 12.3 g/dL (ref 12.0–15.0)
MCH: 31.3 pg (ref 26.0–34.0)
MCHC: 31.3 g/dL (ref 30.0–36.0)
MCV: 100 fL (ref 80.0–100.0)
Platelets: 236 10*3/uL (ref 150–400)
RBC: 3.93 MIL/uL (ref 3.87–5.11)
RDW: 15.6 % — ABNORMAL HIGH (ref 11.5–15.5)
WBC: 5 10*3/uL (ref 4.0–10.5)
nRBC: 0 % (ref 0.0–0.2)

## 2021-01-23 LAB — TSH: TSH: 3.26 u[IU]/mL (ref 0.350–4.500)

## 2021-01-23 LAB — MAGNESIUM: Magnesium: 2 mg/dL (ref 1.7–2.4)

## 2021-02-01 DIAGNOSIS — Z23 Encounter for immunization: Secondary | ICD-10-CM | POA: Diagnosis not present

## 2021-02-03 ENCOUNTER — Ambulatory Visit (HOSPITAL_COMMUNITY)
Admission: RE | Admit: 2021-02-03 | Discharge: 2021-02-03 | Disposition: A | Discharge status: Home / Self Care | Payer: Medicare Other | Source: Ambulatory Visit | Attending: Internal Medicine | Admitting: Internal Medicine

## 2021-02-03 DIAGNOSIS — T85590A Other mechanical complication of bile duct prosthesis, initial encounter: Secondary | ICD-10-CM | POA: Diagnosis not present

## 2021-02-03 DIAGNOSIS — Z434 Encounter for attention to other artificial openings of digestive tract: Secondary | ICD-10-CM | POA: Diagnosis not present

## 2021-02-03 HISTORY — PX: IR EXCHANGE BILIARY DRAIN: IMG6046

## 2021-02-03 MED ORDER — IOHEXOL 300 MG/ML  SOLN: 50.0000 mL | Freq: Once | INTRAMUSCULAR | Status: DC | PRN

## 2021-02-03 MED ORDER — IOHEXOL 300 MG/ML  SOLN
50.0000 mL | Freq: Once | INTRAMUSCULAR | Status: AC | PRN
  Administered 2021-02-03: 15 mL

## 2021-02-03 MED ORDER — LIDOCAINE HCL (PF) 1 % IJ SOLN
INTRAMUSCULAR | Status: AC
  Filled 2021-02-03: qty 30

## 2021-02-03 MED ORDER — LIDOCAINE HCL 1 % IJ SOLN
INTRAMUSCULAR | Status: DC | PRN
  Administered 2021-02-03: 5 mL

## 2021-02-15 ENCOUNTER — Non-Acute Institutional Stay (SKILLED_NURSING_FACILITY): Payer: Medicare Other | Admitting: Adult Health

## 2021-02-15 ENCOUNTER — Encounter: Payer: Self-pay | Admitting: Adult Health

## 2021-02-15 DIAGNOSIS — B009 Herpesviral infection, unspecified: Secondary | ICD-10-CM | POA: Diagnosis not present

## 2021-02-15 DIAGNOSIS — F0391 Unspecified dementia with behavioral disturbance: Secondary | ICD-10-CM | POA: Diagnosis not present

## 2021-02-15 DIAGNOSIS — K8012 Calculus of gallbladder with acute and chronic cholecystitis without obstruction: Secondary | ICD-10-CM | POA: Diagnosis not present

## 2021-02-15 NOTE — Progress Notes (Signed)
Location:  [facility] Room Number: 141/D Place of Service:  SNF (31)   CODE STATUS: Full Code  No Known Allergies  Chief Complaint  Patient presents with  . Medical Management of Chronic Issues           Calculus of gall bladder with acute cholecystitis and obstruction:     Dementia without behavioral disturbance unspecified dementia type:    Chronic herpes:     HPI:  She is a 87 year old long term resident of this facility being seen for the management of her chronic illnesses: Calculus of gall bladder with acute cholecystitis and obstruction:     Dementia without behavioral disturbance unspecified dementia type:    Chronic herpes. There are no reports of uncontrolled pain; no changes in appetite. No reports of anxiety or agitation.   Past Medical History:  Diagnosis Date  . Breast cancer ([place])   . Cognitive communication deficit   . Dementia ([place])   . Difficulty in walking(719.7)   . Fall   . HTN (hypertension) 08/26/2016  . Muscle weakness (generalized)   . Rash and nonspecific skin eruption   . Thyroid disease    hypo  . Urinary tract infection, site not specified   . Vitamin B deficiency     Past Surgical History:  Procedure Laterality Date  . COLON SURGERY    . IR CATHETER TUBE CHANGE  05/07/2017  . IR EXCHANGE BILIARY DRAIN  07/02/2017  . IR EXCHANGE BILIARY DRAIN  07/10/2017  . IR EXCHANGE BILIARY DRAIN  08/28/2017  . IR EXCHANGE BILIARY DRAIN  10/11/2017  . IR EXCHANGE BILIARY DRAIN  10/28/2017  . IR EXCHANGE BILIARY DRAIN  12/23/2017  . IR EXCHANGE BILIARY DRAIN  01/23/2018  . IR EXCHANGE BILIARY DRAIN  02/17/2018  . IR EXCHANGE BILIARY DRAIN  03/20/2018  . IR EXCHANGE BILIARY DRAIN  04/17/2018  . IR EXCHANGE BILIARY DRAIN  05/29/2018  . IR EXCHANGE BILIARY DRAIN  07/24/2018  . IR EXCHANGE BILIARY DRAIN  09/22/2018  . IR EXCHANGE BILIARY DRAIN  11/20/2018  . IR EXCHANGE BILIARY DRAIN  02/11/2019  . IR EXCHANGE BILIARY DRAIN  04/14/2019  . IR  EXCHANGE BILIARY DRAIN  06/09/2019  . IR EXCHANGE BILIARY DRAIN  11/26/2019  . IR EXCHANGE BILIARY DRAIN  12/23/2019  . IR EXCHANGE BILIARY DRAIN  02/22/2020  . IR EXCHANGE BILIARY DRAIN  05/25/2020  . IR EXCHANGE BILIARY DRAIN  07/29/2020  . IR EXCHANGE BILIARY DRAIN  08/30/2020  . IR EXCHANGE BILIARY DRAIN  10/06/2020  . IR EXCHANGE BILIARY DRAIN  11/18/2020  . IR EXCHANGE BILIARY DRAIN  12/21/2020  . IR EXCHANGE BILIARY DRAIN  02/03/2021  . IR PERC CHOLECYSTOSTOMY  01/27/2017  . IR PERC CHOLECYSTOSTOMY  11/18/2019  . IR RADIOLOGIST EVAL & MGMT  03/05/2017  . IR SINUS/FIST TUBE CHK-NON GI  10/06/2020  . MASTECTOMY, PARTIAL Right     Social History   Socioeconomic History  . Marital status: Widowed    Spouse name: Not on file  . Number of children: Not on file  . Years of education: Not on file  . Highest education level: Not on file  Occupational History  . Not on file  Tobacco Use  . Smoking status: Never Smoker  . Smokeless tobacco: Never Used  Vaping Use  . Vaping Use: Never used  Substance and Sexual Activity  . Alcohol use: No  . Drug use: No  . Sexual activity: Not on file  Other Topics Concern  . Not on file  Social History Narrative  . Not on file   Social Determinants of Health   Financial Resource Strain: Not on file  Food Insecurity: Not on file  Transportation Needs: Not on file  Physical Activity: Not on file  Stress: Not on file  Social Connections: Not on file  Intimate Partner Violence: Not on file   Family History  Problem Relation Age of Onset  . Cancer Father        ? primary  . Diabetes Neg Hx   . Heart disease Neg Hx   . Stroke Neg Hx       VITAL SIGNS BP 125/66   Pulse 75   Temp 98 F (36.7 C)   Ht 5\' 3"  (1.6 m)   Wt 140 lb 9.6 oz (63.8 kg)   BMI 24.91 kg/m   Outpatient Encounter Medications as of 02/15/2021  Medication Sig  . calcium carbonate (TUMS - DOSED IN MG ELEMENTAL CALCIUM) 500 MG chewable tablet Chew 1 tablet by mouth daily.  .  Cholecalciferol 1000 units tablet Take 1,000 Units by mouth daily. hypocalcemia  . Cyanocobalamin (VITAMIN B-12) 1000 MCG SUBL Place 1 tablet under the tongue at bedtime.  . donepezil (ARICEPT) 10 MG tablet Take 1 tablet by mouth at bedtime.  . NON FORMULARY NAS dieT  . Nutritional Supplements (ENSURE ENLIVE PO) Take 1.5 kcal by mouth 2 (two) times daily between meals.  . Nutritional Supplements (NUTRITIONAL SUPPLEMENT PO) Take 1 each by mouth at bedtime. Magic Cup  . polyethylene glycol (MIRALAX / GLYCOLAX) packet Take 17 g by mouth daily as needed for mild constipation or moderate constipation.   . sodium chloride irrigation 0.9 % irrigation Irrigate with 5 mLs as directed daily. Special Instructions: flush cholecystostomy daily  . valACYclovir (VALTREX) 500 MG tablet Take 500 mg by mouth daily. Special Instructions: HSV suppression   No facility-administered encounter medications on file as of 02/15/2021.     SIGNIFICANT DIAGNOSTIC EXAMS  PREVIOUS;   08-26-19: ct of abdomen  1. Interval removal of the previously seen percutaneous cholecystostomy tube. Cholelithiasis with findings concerning for acute on chronic cholecystitis. Clinical correlation is recommended. Ultrasound may provide better evaluation of the gallbladder. 2. Diffuse colonic diverticulosis. No bowel obstruction. 3. Aortic Atherosclerosis   11-13-19: ct of abdomen and pelvis:  1. Persistently thickened gallbladder wall with numerous gallstones, including one at the level of the gallbladder neck. Surrounding pericholecystic fat stranding without a well-defined fluid collection or evidence of abscess. Findings are concerning for acute on chronic cholecystitis. Consider nuclear medicine hepatobiliary scan to evaluate patency of the cystic and common bile ducts. 2. Extensive colonic diverticulosis without evidence of diverticulitis.  NO NEW EXAMS.    LABS REVIEWED: PREVIOUS:   05-05-20: wbc 5.0; hgb 13.0; hct 40.5 mcv 96.4  plt 253; glucose 86; bun 16; creat 0.90; k+ 4.0; na++ 137; ca 8.9 liver normal albumin 3.6   TODAY  01-23-21: wbc 5.0; hgb 12.3; hct 39.3; mcv 100.0 plt 236; glucose 89; bun 16; creat 0.86; k+ 4.6; na++ 141; ca 9.0 GFR>60; liver normal albumin 3.2 mag 2.0; tsh 3.260  Review of Systems  Constitutional: Negative for malaise/fatigue.  Respiratory: Negative for cough and shortness of breath.   Cardiovascular: Negative for chest pain, palpitations and leg swelling.  Gastrointestinal: Negative for abdominal pain, constipation and heartburn.  Musculoskeletal: Negative for back pain, joint pain and myalgias.  Skin: Negative.   Neurological: Negative for dizziness.  Psychiatric/Behavioral: The  patient is not nervous/anxious.       Physical Exam Constitutional:      General: She is not in acute distress.    Appearance: She is well-developed. She is not diaphoretic.  Neck:     Thyroid: No thyromegaly.  Cardiovascular:     Rate and Rhythm: Normal rate and regular rhythm.     Heart sounds: Normal heart sounds.  Pulmonary:     Effort: Pulmonary effort is normal. No respiratory distress.     Breath sounds: Normal breath sounds.  Chest:     Comments: History of right partial mastectomy Abdominal:     General: Bowel sounds are normal. There is no distension.     Palpations: Abdomen is soft.     Tenderness: There is no abdominal tenderness.     Comments: Chole drain in place    Musculoskeletal:        General: Normal range of motion.     Right lower leg: No edema.     Left lower leg: No edema.  Lymphadenopathy:     Cervical: No cervical adenopathy.  Skin:    General: Skin is warm and dry.  Neurological:     Mental Status: She is alert. Mental status is at baseline.  Psychiatric:        Mood and Affect: Mood normal.       ASSESSMENT/ PLAN:  TODAY;   1. Calculus of gall bladder with acute cholecystitis and obstruction: has chole drain last changed 02-03-21  2. Dementia without  behavioral disturbance unspecified dementia type: is without change weight is stable at 140 pounds will continue aricept 10 mg daily   3. Chronic herpes: no recent outbreak will continue valtrex 500 mg daily    PREVIOUS   4. Essential hypertension: is stable b/p 125/66 will monitor   5. Chronic constipation: is stable will continue miralax daily as needed  6. History of breast cancer:  Is status post right partial mastectomy   7. Aortic atherosclerosis: (ct 08-26-19) will monitor     Ok Edwards NP [facility]  Contact [phone] Monday through Friday 8am- 5pm  After hours call [phone]

## 2021-02-20 DIAGNOSIS — B009 Herpesviral infection, unspecified: Secondary | ICD-10-CM | POA: Insufficient documentation

## 2021-03-06 ENCOUNTER — Inpatient Hospital Stay (HOSPITAL_COMMUNITY): Admit: 2021-03-06 | Payer: Medicare Other

## 2021-03-07 ENCOUNTER — Ambulatory Visit (HOSPITAL_COMMUNITY)
Admission: RE | Admit: 2021-03-07 | Discharge: 2021-03-07 | Disposition: A | Discharge status: Home / Self Care | Payer: Medicare Other | Source: Ambulatory Visit | Attending: Interventional Radiology | Admitting: Interventional Radiology

## 2021-03-07 DIAGNOSIS — K819 Cholecystitis, unspecified: Secondary | ICD-10-CM | POA: Insufficient documentation

## 2021-03-07 DIAGNOSIS — K8000 Calculus of gallbladder with acute cholecystitis without obstruction: Secondary | ICD-10-CM | POA: Diagnosis not present

## 2021-03-07 HISTORY — PX: IR EXCHANGE BILIARY DRAIN: IMG6046

## 2021-03-07 MED ORDER — LIDOCAINE-EPINEPHRINE 1 %-1:100000 IJ SOLN
INTRAMUSCULAR | Status: AC
  Administered 2021-03-07: 2 mL via SUBCUTANEOUS
  Filled 2021-03-07: qty 1

## 2021-03-07 MED ORDER — IOHEXOL 300 MG/ML  SOLN
50.0000 mL | Freq: Once | INTRAMUSCULAR | Status: AC | PRN
  Administered 2021-03-07: 5 mL

## 2021-03-07 NOTE — Procedures (Signed)
Pre procedural Dx: Chronic chole tube Post procedural Dx: Same  Successful routine fluoro guided exchange of 12 Fr drainage catheter placement into the gallbladder lumen. Chole tube connected to gravity bag.  EBL: None Complications: None immediate  Ronny Bacon, MD Pager #: [phone]

## 2021-03-10 ENCOUNTER — Encounter: Payer: Self-pay | Admitting: Adult Health

## 2021-03-10 ENCOUNTER — Non-Acute Institutional Stay (SKILLED_NURSING_FACILITY): Payer: Medicare Other | Admitting: Adult Health

## 2021-03-10 DIAGNOSIS — Z853 Personal history of malignant neoplasm of breast: Secondary | ICD-10-CM | POA: Diagnosis not present

## 2021-03-10 DIAGNOSIS — I10 Essential (primary) hypertension: Secondary | ICD-10-CM

## 2021-03-10 DIAGNOSIS — K5909 Other constipation: Secondary | ICD-10-CM

## 2021-03-10 NOTE — Progress Notes (Signed)
Location:  [place] Room Number: 141-D Place of Service:  SNF (31)   CODE STATUS: Full No Known Allergies  Chief Complaint  Patient presents with  . Medical Management of Chronic Issues             Essential hypertension:     Chronic constipation:   History of breast cancer    HPI:  She is a 87 year old long term resident of this facility being seen for the management of her chronic illnesses:Essential hypertension:     Chronic constipation:   History of breast cancer. There are no reports of abdominal pain; no reports of anxiety; no reports of constipation.    Past Medical History:  Diagnosis Date  . Breast cancer ([place])   . Cognitive communication deficit   . Dementia ([place])   . Difficulty in walking(719.7)   . Fall   . HTN (hypertension) 08/26/2016  . Muscle weakness (generalized)   . Rash and nonspecific skin eruption   . Thyroid disease    hypo  . Urinary tract infection, site not specified   . Vitamin B deficiency     Past Surgical History:  Procedure Laterality Date  . COLON SURGERY    . IR CATHETER TUBE CHANGE  05/07/2017  . IR EXCHANGE BILIARY DRAIN  07/02/2017  . IR EXCHANGE BILIARY DRAIN  07/10/2017  . IR EXCHANGE BILIARY DRAIN  08/28/2017  . IR EXCHANGE BILIARY DRAIN  10/11/2017  . IR EXCHANGE BILIARY DRAIN  10/28/2017  . IR EXCHANGE BILIARY DRAIN  12/23/2017  . IR EXCHANGE BILIARY DRAIN  01/23/2018  . IR EXCHANGE BILIARY DRAIN  02/17/2018  . IR EXCHANGE BILIARY DRAIN  03/20/2018  . IR EXCHANGE BILIARY DRAIN  04/17/2018  . IR EXCHANGE BILIARY DRAIN  05/29/2018  . IR EXCHANGE BILIARY DRAIN  07/24/2018  . IR EXCHANGE BILIARY DRAIN  09/22/2018  . IR EXCHANGE BILIARY DRAIN  11/20/2018  . IR EXCHANGE BILIARY DRAIN  02/11/2019  . IR EXCHANGE BILIARY DRAIN  04/14/2019  . IR EXCHANGE BILIARY DRAIN  06/09/2019  . IR EXCHANGE BILIARY DRAIN  11/26/2019  . IR EXCHANGE BILIARY DRAIN  12/23/2019  . IR EXCHANGE BILIARY DRAIN  02/22/2020  . IR EXCHANGE BILIARY DRAIN   05/25/2020  . IR EXCHANGE BILIARY DRAIN  07/29/2020  . IR EXCHANGE BILIARY DRAIN  08/30/2020  . IR EXCHANGE BILIARY DRAIN  10/06/2020  . IR EXCHANGE BILIARY DRAIN  11/18/2020  . IR EXCHANGE BILIARY DRAIN  12/21/2020  . IR EXCHANGE BILIARY DRAIN  02/03/2021  . IR EXCHANGE BILIARY DRAIN  03/07/2021  . IR PERC CHOLECYSTOSTOMY  01/27/2017  . IR PERC CHOLECYSTOSTOMY  11/18/2019  . IR RADIOLOGIST EVAL & MGMT  03/05/2017  . IR SINUS/FIST TUBE CHK-NON GI  10/06/2020  . MASTECTOMY, PARTIAL Right     Social History   Socioeconomic History  . Marital status: Widowed    Spouse name: Not on file  . Number of children: Not on file  . Years of education: Not on file  . Highest education level: Not on file  Occupational History  . Not on file  Tobacco Use  . Smoking status: Never Smoker  . Smokeless tobacco: Never Used  Vaping Use  . Vaping Use: Never used  Substance and Sexual Activity  . Alcohol use: No  . Drug use: No  . Sexual activity: Not on file  Other Topics Concern  . Not on file  Social History Narrative  . Not on  file   Social Determinants of Health   Financial Resource Strain: Not on file  Food Insecurity: Not on file  Transportation Needs: Not on file  Physical Activity: Not on file  Stress: Not on file  Social Connections: Not on file  Intimate Partner Violence: Not on file   Family History  Problem Relation Age of Onset  . Cancer Father        ? primary  . Diabetes Neg Hx   . Heart disease Neg Hx   . Stroke Neg Hx       VITAL SIGNS BP 123/77   Pulse 82   Temp 98 F (36.7 C)   Resp 18   Ht 5\' 3"  (1.6 m)   Wt 140 lb 6.4 oz (63.7 kg)   SpO2 93%   BMI 24.87 kg/m   Outpatient Encounter Medications as of 03/10/2021  Medication Sig  . calcium carbonate (TUMS - DOSED IN MG ELEMENTAL CALCIUM) 500 MG chewable tablet Chew 1 tablet by mouth daily.  . Cholecalciferol 1000 units tablet Take 1,000 Units by mouth daily. hypocalcemia  . Cyanocobalamin (VITAMIN B-12) 1000  MCG SUBL Place 1 tablet under the tongue at bedtime.  . donepezil (ARICEPT) 10 MG tablet Take 1 tablet by mouth at bedtime.  . NON FORMULARY NAS dieT  . Nutritional Supplements (ENSURE ENLIVE PO) Take 1.5 kcal by mouth 2 (two) times daily between meals.  . Nutritional Supplements (NUTRITIONAL SUPPLEMENT PO) Take 1 each by mouth at bedtime. Magic Cup  . polyethylene glycol (MIRALAX / GLYCOLAX) packet Take 17 g by mouth daily as needed for mild constipation or moderate constipation.   . sodium chloride irrigation 0.9 % irrigation Irrigate with 5 mLs as directed daily. Special Instructions: flush cholecystostomy daily  . valACYclovir (VALTREX) 500 MG tablet Take 500 mg by mouth daily. Special Instructions: HSV suppression   No facility-administered encounter medications on file as of 03/10/2021.     SIGNIFICANT DIAGNOSTIC EXAMS   PREVIOUS;   11-13-19: ct of abdomen and pelvis:  1. Persistently thickened gallbladder wall with numerous gallstones, including one at the level of the gallbladder neck. Surrounding pericholecystic fat stranding without a well-defined fluid collection or evidence of abscess. Findings are concerning for acute on chronic cholecystitis. Consider nuclear medicine hepatobiliary scan to evaluate patency of the cystic and common bile ducts. 2. Extensive colonic diverticulosis without evidence of diverticulitis.  NO NEW EXAMS.    LABS REVIEWED: PREVIOUS:   05-05-20: wbc 5.0; hgb 13.0; hct 40.5 mcv 96.4 plt 253; glucose 86; bun 16; creat 0.90; k+ 4.0; na++ 137; ca 8.9 liver normal albumin 3.6  01-23-21: wbc 5.0; hgb 12.3; hct 39.3; mcv 100.0 plt 236; glucose 89; bun 16; creat 0.86; k+ 4.6; na++ 141; ca 9.0 GFR>60; liver normal albumin 3.2 mag 2.0; tsh 3.260  NO NEW LABS.   Review of Systems  Constitutional: Negative for malaise/fatigue.  Respiratory: Negative for cough and shortness of breath.   Cardiovascular: Negative for chest pain, palpitations and leg swelling.   Gastrointestinal: Negative for abdominal pain, constipation and heartburn.  Musculoskeletal: Negative for back pain, joint pain and myalgias.  Skin: Negative.   Neurological: Negative for dizziness.  Psychiatric/Behavioral: The patient is not nervous/anxious.     Physical Exam Constitutional:      General: She is not in acute distress.    Appearance: She is well-developed. She is not diaphoretic.  Neck:     Thyroid: No thyromegaly.  Cardiovascular:     Rate and Rhythm: Normal  rate and regular rhythm.     Heart sounds: Normal heart sounds.  Pulmonary:     Effort: Pulmonary effort is normal. No respiratory distress.     Breath sounds: Normal breath sounds.  Chest:     Comments:  History of right partial mastectomy Abdominal:     General: Bowel sounds are normal. There is no distension.     Palpations: Abdomen is soft.     Tenderness: There is no abdominal tenderness.     Comments: Chole drain in place     Musculoskeletal:        General: Normal range of motion.     Right lower leg: No edema.     Left lower leg: No edema.  Lymphadenopathy:     Cervical: No cervical adenopathy.  Skin:    General: Skin is warm and dry.  Neurological:     Mental Status: She is alert. Mental status is at baseline.  Psychiatric:        Mood and Affect: Mood normal.       ASSESSMENT/ PLAN:  TODAY;   1. Essential hypertension: is stable b/p 123/77 will monitor   2. Chronic constipation: is stable will continue miralax daily as needed  3. History of breast cancer: is status post right partial mastectomy   PREVIOUS   4. Aortic atherosclerosis: (ct 08-26-19) will monitor   5. Calculus of gall bladder with acute cholecystitis and obstruction: has chole drain last changed 02-03-21  6. Dementia without behavioral disturbance unspecified dementia type: is without change weight is stable at 140 pounds will continue aricept 10 mg daily   7. Chronic herpes: no recent outbreak will continue  valtrex 500 mg daily     Ok Edwards NP [facility]  Contact [phone] Monday through Friday 8am- 5pm  After hours call [phone]

## 2021-03-22 ENCOUNTER — Other Ambulatory Visit (HOSPITAL_COMMUNITY): Payer: Medicare Other

## 2021-04-19 DIAGNOSIS — Z1159 Encounter for screening for other viral diseases: Secondary | ICD-10-CM | POA: Diagnosis not present

## 2021-04-24 ENCOUNTER — Non-Acute Institutional Stay (SKILLED_NURSING_FACILITY): Payer: Medicare Other | Admitting: Adult Health

## 2021-04-24 DIAGNOSIS — I700 Atherosclerosis of aorta: Secondary | ICD-10-CM | POA: Diagnosis not present

## 2021-04-24 DIAGNOSIS — F0391 Unspecified dementia with behavioral disturbance: Secondary | ICD-10-CM | POA: Diagnosis not present

## 2021-04-24 DIAGNOSIS — K8061 Calculus of gallbladder and bile duct with cholecystitis, unspecified, with obstruction: Secondary | ICD-10-CM

## 2021-04-25 ENCOUNTER — Encounter: Payer: Self-pay | Admitting: Adult Health

## 2021-04-25 DIAGNOSIS — Z20822 Contact with and (suspected) exposure to covid-19: Secondary | ICD-10-CM | POA: Diagnosis not present

## 2021-04-25 DIAGNOSIS — K8061 Calculus of gallbladder and bile duct with cholecystitis, unspecified, with obstruction: Secondary | ICD-10-CM | POA: Insufficient documentation

## 2021-04-25 DIAGNOSIS — Z8616 Personal history of COVID-19: Secondary | ICD-10-CM | POA: Diagnosis not present

## 2021-04-25 NOTE — Progress Notes (Signed)
Location:  [facility] Room Number: 141 Place of Service:  SNF (31)   CODE STATUS: full code   No Known Allergies  Chief Complaint  Patient presents with  . Medical Management of Chronic Issues        Aortic atherosclerosis    Calculus of gall bladder with out acute cholecystitis and obstruction:   Dementia without behavioral disturbance unspecified dementia type    HPI:  She is a 87 year old long term resident of this facility being seen for the management of her chronic illnesses: Aortic atherosclerosis    Calculus of gall bladder with out acute cholecystitis and obstruction:   Dementia without behavioral disturbance unspecified dementia type. There are no reports of n/v. No reports of uncontrolled pain; no reports of anxiety   Past Medical History:  Diagnosis Date  . Breast cancer ([place])   . Cognitive communication deficit   . Dementia ([place])   . Difficulty in walking(719.7)   . Fall   . HTN (hypertension) 08/26/2016  . Muscle weakness (generalized)   . Rash and nonspecific skin eruption   . Thyroid disease    hypo  . Urinary tract infection, site not specified   . Vitamin B deficiency     Past Surgical History:  Procedure Laterality Date  . COLON SURGERY    . IR CATHETER TUBE CHANGE  05/07/2017  . IR EXCHANGE BILIARY DRAIN  07/02/2017  . IR EXCHANGE BILIARY DRAIN  07/10/2017  . IR EXCHANGE BILIARY DRAIN  08/28/2017  . IR EXCHANGE BILIARY DRAIN  10/11/2017  . IR EXCHANGE BILIARY DRAIN  10/28/2017  . IR EXCHANGE BILIARY DRAIN  12/23/2017  . IR EXCHANGE BILIARY DRAIN  01/23/2018  . IR EXCHANGE BILIARY DRAIN  02/17/2018  . IR EXCHANGE BILIARY DRAIN  03/20/2018  . IR EXCHANGE BILIARY DRAIN  04/17/2018  . IR EXCHANGE BILIARY DRAIN  05/29/2018  . IR EXCHANGE BILIARY DRAIN  07/24/2018  . IR EXCHANGE BILIARY DRAIN  09/22/2018  . IR EXCHANGE BILIARY DRAIN  11/20/2018  . IR EXCHANGE BILIARY DRAIN  02/11/2019  . IR EXCHANGE BILIARY DRAIN  04/14/2019  . IR EXCHANGE  BILIARY DRAIN  06/09/2019  . IR EXCHANGE BILIARY DRAIN  11/26/2019  . IR EXCHANGE BILIARY DRAIN  12/23/2019  . IR EXCHANGE BILIARY DRAIN  02/22/2020  . IR EXCHANGE BILIARY DRAIN  05/25/2020  . IR EXCHANGE BILIARY DRAIN  07/29/2020  . IR EXCHANGE BILIARY DRAIN  08/30/2020  . IR EXCHANGE BILIARY DRAIN  10/06/2020  . IR EXCHANGE BILIARY DRAIN  11/18/2020  . IR EXCHANGE BILIARY DRAIN  12/21/2020  . IR EXCHANGE BILIARY DRAIN  02/03/2021  . IR EXCHANGE BILIARY DRAIN  03/07/2021  . IR PERC CHOLECYSTOSTOMY  01/27/2017  . IR PERC CHOLECYSTOSTOMY  11/18/2019  . IR RADIOLOGIST EVAL & MGMT  03/05/2017  . IR SINUS/FIST TUBE CHK-NON GI  10/06/2020  . MASTECTOMY, PARTIAL Right     Social History   Socioeconomic History  . Marital status: Widowed    Spouse name: Not on file  . Number of children: Not on file  . Years of education: Not on file  . Highest education level: Not on file  Occupational History  . Not on file  Tobacco Use  . Smoking status: Never  . Smokeless tobacco: Never  Vaping Use  . Vaping Use: Never used  Substance and Sexual Activity  . Alcohol use: No  . Drug use: No  . Sexual activity: Not on file  Other Topics Concern  . Not on file  Social History Narrative  . Not on file   Social Determinants of Health   Financial Resource Strain: Not on file  Food Insecurity: Not on file  Transportation Needs: Not on file  Physical Activity: Not on file  Stress: Not on file  Social Connections: Not on file  Intimate Partner Violence: Not on file   Family History  Problem Relation Age of Onset  . Cancer Father        ? primary  . Diabetes Neg Hx   . Heart disease Neg Hx   . Stroke Neg Hx       VITAL SIGNS BP (!) 127/58   Pulse 64   Temp 97.6 F (36.4 C)   Resp 18   Ht 5\' 3"  (1.6 m)   Wt 140 lb 6.4 oz (63.7 kg)   BMI 24.87 kg/m   Outpatient Encounter Medications as of 04/24/2021  Medication Sig  . calcium carbonate (TUMS - DOSED IN MG ELEMENTAL CALCIUM) 500 MG chewable  tablet Chew 1 tablet by mouth daily.  . Cholecalciferol 1000 units tablet Take 1,000 Units by mouth daily. hypocalcemia  . Cyanocobalamin (VITAMIN B-12) 1000 MCG SUBL Place 1 tablet under the tongue at bedtime.  . donepezil (ARICEPT) 10 MG tablet Take 1 tablet by mouth at bedtime.  . NON FORMULARY NAS dieT  . Nutritional Supplements (ENSURE ENLIVE PO) Take 1.5 kcal by mouth 2 (two) times daily between meals.  . Nutritional Supplements (NUTRITIONAL SUPPLEMENT PO) Take 1 each by mouth at bedtime. Magic Cup  . polyethylene glycol (MIRALAX / GLYCOLAX) packet Take 17 g by mouth daily as needed for mild constipation or moderate constipation.   . sodium chloride irrigation 0.9 % irrigation Irrigate with 5 mLs as directed daily. Special Instructions: flush cholecystostomy daily  . valACYclovir (VALTREX) 500 MG tablet Take 500 mg by mouth daily. Special Instructions: HSV suppression   No facility-administered encounter medications on file as of 04/24/2021.     SIGNIFICANT DIAGNOSTIC EXAMS   PREVIOUS;   11-13-19: ct of abdomen and pelvis:  1. Persistently thickened gallbladder wall with numerous gallstones, including one at the level of the gallbladder neck. Surrounding pericholecystic fat stranding without a well-defined fluid collection or evidence of abscess. Findings are concerning for acute on chronic cholecystitis. Consider nuclear medicine hepatobiliary scan to evaluate patency of the cystic and common bile ducts. 2. Extensive colonic diverticulosis without evidence of diverticulitis.  NO NEW EXAMS.    LABS REVIEWED: PREVIOUS:   05-05-20: wbc 5.0; hgb 13.0; hct 40.5 mcv 96.4 plt 253; glucose 86; bun 16; creat 0.90; k+ 4.0; na++ 137; ca 8.9 liver normal albumin 3.6  01-23-21: wbc 5.0; hgb 12.3; hct 39.3; mcv 100.0 plt 236; glucose 89; bun 16; creat 0.86; k+ 4.6; na++ 141; ca 9.0 GFR>60; liver normal albumin 3.2 mag 2.0; tsh 3.260  NO NEW LABS.   Review of Systems  Constitutional:  Negative  for malaise/fatigue.  Respiratory:  Negative for cough and shortness of breath.   Cardiovascular:  Negative for chest pain, palpitations and leg swelling.  Gastrointestinal:  Negative for abdominal pain, constipation and heartburn.  Musculoskeletal:  Negative for back pain, joint pain and myalgias.  Skin: Negative.   Neurological:  Negative for dizziness.  Psychiatric/Behavioral:  The patient is not nervous/anxious.     Physical Exam Constitutional:      General: She is not in acute distress.    Appearance: She is well-developed. She is not  diaphoretic.  Neck:     Thyroid: No thyromegaly.  Cardiovascular:     Rate and Rhythm: Normal rate and regular rhythm.     Pulses: Normal pulses.     Heart sounds: Normal heart sounds.  Pulmonary:     Effort: Pulmonary effort is normal. No respiratory distress.     Breath sounds: Normal breath sounds.  Chest:     Comments:  History of right partial mastectomy Abdominal:     General: Bowel sounds are normal. There is no distension.     Palpations: Abdomen is soft.     Tenderness: There is no abdominal tenderness.     Comments: Chole drain in place      Musculoskeletal:        General: Normal range of motion.     Cervical back: Neck supple.     Right lower leg: No edema.     Left lower leg: No edema.  Lymphadenopathy:     Cervical: No cervical adenopathy.  Skin:    General: Skin is warm and dry.  Neurological:     Mental Status: She is alert. Mental status is at baseline.  Psychiatric:        Mood and Affect: Mood normal.       ASSESSMENT/ PLAN:  TODAY;    Aortic atherosclerosis (ct 08-26-19) will monitor  2. Calculus of gall bladder with out acute cholecystitis and obstruction: has chole drain last changed 02-03-21  3. Dementia without behavioral disturbance unspecified dementia type: is without change weight is stable at 140 pounds will continue aricept 10 mg daily   PREVIOUS   4. Chronic herpes: no recent outbreak will  continue valtrex 500 mg daily   5. Essential hypertension: is stable b/p 127/58 will monitor   6. Chronic constipation: is stable will continue miralax daily as needed  7. History of breast cancer: is status post right partial mastectomy       Cynthia Edwards NP [facility]  Contact [phone] Monday through Friday 8am- 5pm  After hours call [phone]

## 2021-04-27 DIAGNOSIS — Z1159 Encounter for screening for other viral diseases: Secondary | ICD-10-CM | POA: Diagnosis not present

## 2021-05-02 ENCOUNTER — Encounter: Payer: Self-pay | Admitting: Adult Health

## 2021-05-02 DIAGNOSIS — Z1159 Encounter for screening for other viral diseases: Secondary | ICD-10-CM | POA: Diagnosis not present

## 2021-05-02 NOTE — Progress Notes (Signed)
Location:  [place] Room Number: 141 Place of Service:  SNF (31)   CODE STATUS: full code   No Known Allergies  Chief Complaint  Patient presents with  . Medical Management of Chronic Issues               HPI:    Past Medical History:  Diagnosis Date  . Breast cancer ([place])   . Cognitive communication deficit   . Dementia ([place])   . Difficulty in walking(719.7)   . Fall   . HTN (hypertension) 08/26/2016  . Muscle weakness (generalized)   . Rash and nonspecific skin eruption   . Thyroid disease    hypo  . Urinary tract infection, site not specified   . Vitamin B deficiency     Past Surgical History:  Procedure Laterality Date  . COLON SURGERY    . IR CATHETER TUBE CHANGE  05/07/2017  . IR EXCHANGE BILIARY DRAIN  07/02/2017  . IR EXCHANGE BILIARY DRAIN  07/10/2017  . IR EXCHANGE BILIARY DRAIN  08/28/2017  . IR EXCHANGE BILIARY DRAIN  10/11/2017  . IR EXCHANGE BILIARY DRAIN  10/28/2017  . IR EXCHANGE BILIARY DRAIN  12/23/2017  . IR EXCHANGE BILIARY DRAIN  01/23/2018  . IR EXCHANGE BILIARY DRAIN  02/17/2018  . IR EXCHANGE BILIARY DRAIN  03/20/2018  . IR EXCHANGE BILIARY DRAIN  04/17/2018  . IR EXCHANGE BILIARY DRAIN  05/29/2018  . IR EXCHANGE BILIARY DRAIN  07/24/2018  . IR EXCHANGE BILIARY DRAIN  09/22/2018  . IR EXCHANGE BILIARY DRAIN  11/20/2018  . IR EXCHANGE BILIARY DRAIN  02/11/2019  . IR EXCHANGE BILIARY DRAIN  04/14/2019  . IR EXCHANGE BILIARY DRAIN  06/09/2019  . IR EXCHANGE BILIARY DRAIN  11/26/2019  . IR EXCHANGE BILIARY DRAIN  12/23/2019  . IR EXCHANGE BILIARY DRAIN  02/22/2020  . IR EXCHANGE BILIARY DRAIN  05/25/2020  . IR EXCHANGE BILIARY DRAIN  07/29/2020  . IR EXCHANGE BILIARY DRAIN  08/30/2020  . IR EXCHANGE BILIARY DRAIN  10/06/2020  . IR EXCHANGE BILIARY DRAIN  11/18/2020  . IR EXCHANGE BILIARY DRAIN  12/21/2020  . IR EXCHANGE BILIARY DRAIN  02/03/2021  . IR EXCHANGE BILIARY DRAIN  03/07/2021  . IR PERC CHOLECYSTOSTOMY  01/27/2017  . IR PERC  CHOLECYSTOSTOMY  11/18/2019  . IR RADIOLOGIST EVAL & MGMT  03/05/2017  . IR SINUS/FIST TUBE CHK-NON GI  10/06/2020  . MASTECTOMY, PARTIAL Right     Social History   Socioeconomic History  . Marital status: Widowed    Spouse name: Not on file  . Number of children: Not on file  . Years of education: Not on file  . Highest education level: Not on file  Occupational History  . Not on file  Tobacco Use  . Smoking status: Never  . Smokeless tobacco: Never  Vaping Use  . Vaping Use: Never used  Substance and Sexual Activity  . Alcohol use: No  . Drug use: No  . Sexual activity: Not on file  Other Topics Concern  . Not on file  Social History Narrative  . Not on file   Social Determinants of Health   Financial Resource Strain: Not on file  Food Insecurity: Not on file  Transportation Needs: Not on file  Physical Activity: Not on file  Stress: Not on file  Social Connections: Not on file  Intimate Partner Violence: Not on file   Family History  Problem Relation Age of Onset  .  Cancer Father        ? primary  . Diabetes Neg Hx   . Heart disease Neg Hx   . Stroke Neg Hx       VITAL SIGNS BP 121/73   Pulse 70   Temp 97.6 F (36.4 C)   Resp 18   Ht 5\' 3"  (1.6 m)   Wt 135 lb 12.8 oz (61.6 kg)   BMI 24.06 kg/m   Outpatient Encounter Medications as of 05/02/2021  Medication Sig  . calcium carbonate (TUMS - DOSED IN MG ELEMENTAL CALCIUM) 500 MG chewable tablet Chew 1 tablet by mouth daily.  . Cholecalciferol 1000 units tablet Take 1,000 Units by mouth daily. hypocalcemia  . Cyanocobalamin (VITAMIN B-12) 1000 MCG SUBL Place 1 tablet under the tongue at bedtime.  . donepezil (ARICEPT) 10 MG tablet Take 1 tablet by mouth at bedtime.  . NON FORMULARY NAS dieT  . Nutritional Supplements (ENSURE ENLIVE PO) Take 1.5 kcal by mouth 2 (two) times daily between meals.  . Nutritional Supplements (NUTRITIONAL SUPPLEMENT PO) Take 1 each by mouth at bedtime. Magic Cup  .  polyethylene glycol (MIRALAX / GLYCOLAX) packet Take 17 g by mouth daily as needed for mild constipation or moderate constipation.   . sodium chloride irrigation 0.9 % irrigation Irrigate with 5 mLs as directed daily. Special Instructions: flush cholecystostomy daily  . valACYclovir (VALTREX) 500 MG tablet Take 500 mg by mouth daily. Special Instructions: HSV suppression   No facility-administered encounter medications on file as of 05/02/2021.     SIGNIFICANT DIAGNOSTIC EXAMS       ASSESSMENT/ PLAN:     Ok Edwards NP [facility]  Contact [phone] Monday through Friday 8am- 5pm  After hours call [phone]   This encounter was created in error - please disregard.

## 2021-05-04 ENCOUNTER — Inpatient Hospital Stay (HOSPITAL_COMMUNITY)
Admission: RE | Admit: 2021-05-04 | Discharge: 2021-05-04 | DRG: 395 | Disposition: A | Discharge status: Home / Self Care | Payer: Medicare Other | Source: Ambulatory Visit | Attending: Interventional Radiology | Admitting: Interventional Radiology

## 2021-05-04 DIAGNOSIS — Z434 Encounter for attention to other artificial openings of digestive tract: Secondary | ICD-10-CM

## 2021-05-04 DIAGNOSIS — Z1159 Encounter for screening for other viral diseases: Secondary | ICD-10-CM | POA: Diagnosis not present

## 2021-05-04 HISTORY — PX: IR EXCHANGE BILIARY DRAIN: IMG6046

## 2021-05-04 MED ORDER — LIDOCAINE HCL 1 % IJ SOLN
INTRAMUSCULAR | Status: AC
  Filled 2021-05-04: qty 20

## 2021-05-04 MED ORDER — IOHEXOL 300 MG/ML  SOLN
50.0000 mL | Freq: Once | INTRAMUSCULAR | Status: AC | PRN
  Administered 2021-05-04: 15 mL

## 2021-05-04 NOTE — Procedures (Signed)
Pre procedural Dx: Chronic chole tube Post procedural Dx: Same  Technically successful fluoro guided exchange of 12 Fr chole tube.   Chole tube connected to gravity bag.  EBL: None Complications: None immediate  Ronny Bacon, MD Pager #: [phone]

## 2021-05-09 DIAGNOSIS — Z1159 Encounter for screening for other viral diseases: Secondary | ICD-10-CM | POA: Diagnosis not present

## 2021-05-11 ENCOUNTER — Non-Acute Institutional Stay (SKILLED_NURSING_FACILITY): Payer: Medicare Other | Admitting: Adult Health

## 2021-05-11 ENCOUNTER — Encounter: Payer: Self-pay | Admitting: Adult Health

## 2021-05-11 DIAGNOSIS — F0391 Unspecified dementia with behavioral disturbance: Secondary | ICD-10-CM

## 2021-05-11 DIAGNOSIS — I10 Essential (primary) hypertension: Secondary | ICD-10-CM

## 2021-05-11 DIAGNOSIS — K8061 Calculus of gallbladder and bile duct with cholecystitis, unspecified, with obstruction: Secondary | ICD-10-CM | POA: Diagnosis not present

## 2021-05-11 DIAGNOSIS — I700 Atherosclerosis of aorta: Secondary | ICD-10-CM

## 2021-05-11 DIAGNOSIS — Z1159 Encounter for screening for other viral diseases: Secondary | ICD-10-CM | POA: Diagnosis not present

## 2021-05-11 NOTE — Progress Notes (Signed)
Location:  [place] Room Number: 141-D Place of Service:  SNF (31)   CODE STATUS: Full  No Known Allergies  Chief Complaint  Patient presents with   Acute Visit    Care plan meeting     HPI:  We have come together for her care plan meeting. no BIMS; mood 0/30. She is independent to supervision with adls; is extensive assist with dressing. Limited assist with toileting she is occasionally incontinent of bladder frequently incontinent of bowel. There have been no falls. She is able to feed herself. Dietary:   weight is 135.8 pounds has been stable for 12 months NAS appetite is variable. Her ensure was decreased to one time daily and majic cup daily. Therapy none at this time . She continues to be followed for her chronic illnesses including:Aortic atherosclerosis Dementia with behavioral disturbance unspecified dementia type Primary hypertension Calculus of gall bladder and bile duct with nonacute cholecystitis with obstruction  Past Medical History:  Diagnosis Date   Breast cancer ([place])    Cognitive communication deficit    Dementia ([place])    Difficulty in walking(719.7)    Fall    HTN (hypertension) 08/26/2016   Muscle weakness (generalized)    Rash and nonspecific skin eruption    Thyroid disease    hypo   Urinary tract infection, site not specified    Vitamin B deficiency     Past Surgical History:  Procedure Laterality Date   COLON SURGERY     IR CATHETER TUBE CHANGE  05/07/2017   IR EXCHANGE BILIARY DRAIN  07/02/2017   IR EXCHANGE BILIARY DRAIN  07/10/2017   IR EXCHANGE BILIARY DRAIN  08/28/2017   IR EXCHANGE BILIARY DRAIN  10/11/2017   IR EXCHANGE BILIARY DRAIN  10/28/2017   IR EXCHANGE BILIARY DRAIN  12/23/2017   IR EXCHANGE BILIARY DRAIN  01/23/2018   IR EXCHANGE BILIARY DRAIN  02/17/2018   IR EXCHANGE BILIARY DRAIN  03/20/2018   IR EXCHANGE BILIARY DRAIN  04/17/2018   IR EXCHANGE BILIARY DRAIN  05/29/2018   IR EXCHANGE BILIARY DRAIN  07/24/2018   IR  EXCHANGE BILIARY DRAIN  09/22/2018   IR EXCHANGE BILIARY DRAIN  11/20/2018   IR EXCHANGE BILIARY DRAIN  02/11/2019   IR EXCHANGE BILIARY DRAIN  04/14/2019   IR EXCHANGE BILIARY DRAIN  06/09/2019   IR EXCHANGE BILIARY DRAIN  11/26/2019   IR EXCHANGE BILIARY DRAIN  12/23/2019   IR EXCHANGE BILIARY DRAIN  02/22/2020   IR EXCHANGE BILIARY DRAIN  05/25/2020   IR EXCHANGE BILIARY DRAIN  07/29/2020   IR EXCHANGE BILIARY DRAIN  08/30/2020   IR EXCHANGE BILIARY DRAIN  10/06/2020   IR EXCHANGE BILIARY DRAIN  11/18/2020   IR EXCHANGE BILIARY DRAIN  12/21/2020   IR EXCHANGE BILIARY DRAIN  02/03/2021   IR EXCHANGE BILIARY DRAIN  03/07/2021   IR EXCHANGE BILIARY DRAIN  05/04/2021   IR PERC CHOLECYSTOSTOMY  01/27/2017   IR PERC CHOLECYSTOSTOMY  11/18/2019   IR RADIOLOGIST EVAL & MGMT  03/05/2017   IR SINUS/FIST TUBE CHK-NON GI  10/06/2020   MASTECTOMY, PARTIAL Right     Social History   Socioeconomic History   Marital status: Widowed    Spouse name: Not on file   Number of children: Not on file   Years of education: Not on file   Highest education level: Not on file  Occupational History   Not on file  Tobacco Use   Smoking status: Never  Smokeless tobacco: Never  Vaping Use   Vaping Use: Never used  Substance and Sexual Activity   Alcohol use: No   Drug use: No   Sexual activity: Not on file  Other Topics Concern   Not on file  Social History Narrative   Not on file   Social Determinants of Health   Financial Resource Strain: Not on file  Food Insecurity: Not on file  Transportation Needs: Not on file  Physical Activity: Not on file  Stress: Not on file  Social Connections: Not on file  Intimate Partner Violence: Not on file   Family History  Problem Relation Age of Onset   Cancer Father        ? primary   Diabetes Neg Hx    Heart disease Neg Hx    Stroke Neg Hx       VITAL SIGNS BP (!) 134/57   Pulse 78   Temp 98.3 F (36.8 C)   Resp (!) 21   Ht 5\' 3"  (1.6 m)   Wt 135 lb 12.8  oz (61.6 kg)   SpO2 93%   BMI 24.06 kg/m   Outpatient Encounter Medications as of 05/11/2021  Medication Sig   calcium carbonate (TUMS - DOSED IN MG ELEMENTAL CALCIUM) 500 MG chewable tablet Chew 1 tablet by mouth daily.   Cholecalciferol 1000 units tablet Take 1,000 Units by mouth daily. hypocalcemia   Cyanocobalamin (VITAMIN B-12) 1000 MCG SUBL Place 1 tablet under the tongue at bedtime.   donepezil (ARICEPT) 10 MG tablet Take 1 tablet by mouth at bedtime.   NON FORMULARY NAS dieT   Nutritional Supplements (ENSURE ENLIVE PO) Take 1.5 kcal by mouth 2 (two) times daily between meals.   Nutritional Supplements (NUTRITIONAL SUPPLEMENT PO) Take 1 each by mouth at bedtime. Magic Cup   polyethylene glycol (MIRALAX / GLYCOLAX) packet Take 17 g by mouth daily as needed for mild constipation or moderate constipation.    sodium chloride irrigation 0.9 % irrigation Irrigate with 5 mLs as directed daily. Special Instructions: flush cholecystostomy daily   valACYclovir (VALTREX) 500 MG tablet Take 500 mg by mouth daily. Special Instructions: HSV suppression   No facility-administered encounter medications on file as of 05/11/2021.     SIGNIFICANT DIAGNOSTIC EXAMS   PREVIOUS;   11-13-19: ct of abdomen and pelvis:  1. Persistently thickened gallbladder wall with numerous gallstones, including one at the level of the gallbladder neck. Surrounding pericholecystic fat stranding without a well-defined fluid collection or evidence of abscess. Findings are concerning for acute on chronic cholecystitis. Consider nuclear medicine hepatobiliary scan to evaluate patency of the cystic and common bile ducts. 2. Extensive colonic diverticulosis without evidence of diverticulitis.  NO NEW EXAMS.    LABS REVIEWED: PREVIOUS:   05-05-20: wbc 5.0; hgb 13.0; hct 40.5 mcv 96.4 plt 253; glucose 86; bun 16; creat 0.90; k+ 4.0; na++ 137; ca 8.9 liver normal albumin 3.6  01-23-21: wbc 5.0; hgb 12.3; hct 39.3; mcv 100.0 plt  236; glucose 89; bun 16; creat 0.86; k+ 4.6; na++ 141; ca 9.0 GFR>60; liver normal albumin 3.2 mag 2.0; tsh 3.260  NO NEW LABS.   Review of Systems  Constitutional:  Negative for malaise/fatigue.  Respiratory:  Negative for cough and shortness of breath.   Cardiovascular:  Negative for chest pain, palpitations and leg swelling.  Gastrointestinal:  Negative for abdominal pain, constipation and heartburn.  Musculoskeletal:  Negative for back pain, joint pain and myalgias.  Skin: Negative.   Neurological:  Negative for dizziness.  Psychiatric/Behavioral:  The patient is not nervous/anxious.    Physical Exam Constitutional:      General: She is not in acute distress.    Appearance: She is well-developed. She is not diaphoretic.  Neck:     Thyroid: No thyromegaly.  Cardiovascular:     Rate and Rhythm: Normal rate and regular rhythm.     Heart sounds: Normal heart sounds.  Pulmonary:     Effort: Pulmonary effort is normal. No respiratory distress.     Breath sounds: Normal breath sounds.  Chest:     Comments: History of right partial mastectomy Abdominal:     General: Bowel sounds are normal. There is no distension.     Palpations: Abdomen is soft.     Tenderness: There is no abdominal tenderness.     Comments: Chole drain in place   Musculoskeletal:        General: Normal range of motion.     Cervical back: Neck supple.     Right lower leg: No edema.     Left lower leg: No edema.  Lymphadenopathy:     Cervical: No cervical adenopathy.  Skin:    General: Skin is warm and dry.  Neurological:     Mental Status: She is alert. Mental status is at baseline.  Psychiatric:        Mood and Affect: Mood normal.     ASSESSMENT/ PLAN:  TODAY  Aortic atherosclerosis Dementia with behavioral disturbance unspecified dementia type Primary hypertension Calculus of gall bladder and bile duct with nonacute cholecystitis with obstruction  Will continue current medications Will  continue current plan of care Will continue to monitor her status.   Time spent with patient: 40 minutes: goals of care; care plan medications.    Ok Edwards NP [facility]  Contact [phone] Monday through Friday 8am- 5pm  After hours call [phone]

## 2021-05-16 DIAGNOSIS — Z1159 Encounter for screening for other viral diseases: Secondary | ICD-10-CM | POA: Diagnosis not present

## 2021-05-18 DIAGNOSIS — Z1159 Encounter for screening for other viral diseases: Secondary | ICD-10-CM | POA: Diagnosis not present

## 2021-05-23 DIAGNOSIS — Z1159 Encounter for screening for other viral diseases: Secondary | ICD-10-CM | POA: Diagnosis not present

## 2021-05-26 ENCOUNTER — Encounter: Payer: Self-pay | Admitting: Internal Medicine

## 2021-05-26 ENCOUNTER — Non-Acute Institutional Stay (SKILLED_NURSING_FACILITY): Payer: Medicare Other | Admitting: Internal Medicine

## 2021-05-26 DIAGNOSIS — I10 Essential (primary) hypertension: Secondary | ICD-10-CM | POA: Diagnosis not present

## 2021-05-26 DIAGNOSIS — E538 Deficiency of other specified B group vitamins: Secondary | ICD-10-CM

## 2021-05-26 DIAGNOSIS — B009 Herpesviral infection, unspecified: Secondary | ICD-10-CM

## 2021-05-26 DIAGNOSIS — Z434 Encounter for attention to other artificial openings of digestive tract: Secondary | ICD-10-CM

## 2021-05-26 DIAGNOSIS — F0391 Unspecified dementia with behavioral disturbance: Secondary | ICD-10-CM

## 2021-05-26 NOTE — Patient Instructions (Signed)
See assessment and plan under each diagnosis in the problem list and acutely for this visit 

## 2021-05-26 NOTE — Progress Notes (Signed)
   NURSING HOME LOCATION:  [facility] ROOM NUMBER:  [id number]  CODE STATUS:  Full Code  PCP:  Ok Edwards NP  This is a nursing facility follow up visit of chronic medical diagnoses & to document compliance with Regulation 483.30 (c) in [place] Manual Phase 2 which mandates caregiver visit ( visits can alternate among physician, PA or NP as per statutes) within 10 days of 30 days / 60 days/ 90 days post admission to SNF date    Interim medical record and care since last SNF visit was updated with review of diagnostic studies and change in clinical status since last visit were documented.  HPI: She is a permanent resident of this facility with medical diagnoses of history of breast cancer, dementia, essential hypertension, and hypothyroidism. Surgeries include colon surgery, partial mastectomy, cholecystectomy, and multiple biliary drain interventions.  Review of systems: Dementia invalidated responses.  Her responses were delayed and sarcastic.  When I asked how she was doing; her response was "fine and dandy".  The last time I saw her she had talked about her career in detail.  When asked what she had done prior to retirement her response was "nothing".  She denied any GI symptoms or denied knowledge of the follow-up appointment for the biliary drain replacement.  She denied any GI or abdominal symptoms; but when I attempted to examine the abdomen she became somewhat hostile commanding" do not push on it".  Staff reports that she will threaten to strike staff but not follow through.  She has spit on staff members.   Her Nurse reports that she has "knots" in the inguinal area.  They have applied warm compresses but the patient has refused further evaluation.   She stated that she gets short of breath only if she runs; she ambulates only with a rolling walker.   Physical exam:  Pertinent or positive findings: When I came in the room she demanded to know who I  was.  Her facies exhibited somewhat of a puzzled look as the interview progressed.  She did open her mouth for intraoral exam but flipped her tongue from side to side preventing adequate visualization.  Teeth are coated.  Heart sounds are distant.  She has mild rales anteriorly.  As noted when I attempted to palpate the abdomen she became somewhat hostile.  Pedal pulses are decreased.  She has edema at the sock line.  The biliary drainage catheter is attached to the right ankle.  General appearance: Adequately nourished; no acute distress, increased work of breathing is present.   Lymphatic: No lymphadenopathy about the head, neck, axilla. Eyes: No conjunctival inflammation or lid edema is present. There is no scleral icterus. Ears:  External ear exam shows no significant lesions or deformities.   Nose:  External nasal examination shows no deformity or inflammation. Nasal mucosa are pink and moist without lesions, exudates Neck:  No thyromegaly, masses, tenderness noted.    Heart:  No gallop, murmur, click, rub .  Lungs: without wheezes, rhonchi,rubs. Abdomen: Bowel sounds are normal. Abdomen is soft and nontender with no organomegaly, hernias, masses. GU: Deferred  Extremities:  No cyanosis, clubbing Neurologic exam :Balance, Rhomberg, finger to nose testing could not be completed due to clinical state Skin: Warm & dry w/o tenting. No significant lesions or rash.  See summary under each active problem in the Problem List with associated updated therapeutic plan

## 2021-05-26 NOTE — Assessment & Plan Note (Signed)
This diagnosis was noted 02/20/2021.  I can find no chart entries concerning this. Staff reports "knots" in the inguinal area. I'll attempt to clarify this diagnosis in greater detail.  If she has had herpes; VDRL or RPR are indicated as these are not recorded in Epic

## 2021-05-26 NOTE — Assessment & Plan Note (Addendum)
Reported behaviors include spitting on staff and making verbal threats without physical action.  Today she was very sarcastic in her responses, probably a compensation mechanism for her dementia.  When I last saw her she discussed her career in detail; today she could recall no information about her profession. Consider B12 update; last documentation in 2019.

## 2021-05-26 NOTE — Assessment & Plan Note (Signed)
BP controlled; no change in antihypertensive medications  

## 2021-05-26 NOTE — Assessment & Plan Note (Signed)
Biliary drain exchange scheduled in IR 06/22/21

## 2021-05-28 NOTE — Assessment & Plan Note (Signed)
Last B12 level in 2019; update indicated.

## 2021-06-07 ENCOUNTER — Other Ambulatory Visit (HOSPITAL_COMMUNITY): Payer: Medicare Other

## 2021-06-12 ENCOUNTER — Non-Acute Institutional Stay (SKILLED_NURSING_FACILITY): Payer: Medicare Other | Admitting: Adult Health

## 2021-06-12 ENCOUNTER — Encounter: Payer: Self-pay | Admitting: Adult Health

## 2021-06-12 DIAGNOSIS — Z0000 Encounter for general adult medical examination without abnormal findings: Secondary | ICD-10-CM | POA: Diagnosis not present

## 2021-06-12 NOTE — Patient Instructions (Signed)
  Cynthia [name] , Thank you for taking time to come for your Medicare Wellness Visit. I appreciate your ongoing commitment to your health goals. Please review the following plan we discussed and let me know if I can assist you in the future.   These are the goals we discussed:  Goals      DIET - INCREASE WATER INTAKE     Follow up with Provider as scheduled     General - Client will not be readmitted within 30 days ([facility])        This is a list of the screening recommended for you and due dates:  Health Maintenance  Topic Date Due   Zoster (Shingles) Vaccine (1 of 2) Never done   COVID-19 Vaccine (4 - Booster for Moderna series) 11/25/2020   Flu Shot  05/22/2021   Tetanus Vaccine  07/12/2027   DEXA scan (bone density measurement)  Completed   Pneumonia vaccines  Completed   HPV Vaccine  Aged Out

## 2021-06-12 NOTE — Progress Notes (Signed)
Subjective:   Cynthia [name] is a 87 y.o. female who presents for Medicare Annual (Subsequent) preventive examination.  Review of Systems    Review of Systems  Constitutional:  Negative for malaise/fatigue.  Respiratory:  Negative for cough and shortness of breath.   Cardiovascular:  Negative for chest pain, palpitations and leg swelling.  Gastrointestinal:  Negative for abdominal pain, constipation and heartburn.  Musculoskeletal:  Negative for back pain, joint pain and myalgias.  Skin: Negative.   Neurological:  Negative for dizziness.  Psychiatric/Behavioral:  The patient is not nervous/anxious.    Cardiac Risk Factors include: advanced age (>59mn, >>20women);hypertension;sedentary lifestyle     Objective:    Today's Vitals   06/12/21 1018  BP: 130/70  Pulse: 74  Resp: (!) 21  Temp: 98.3 F (36.8 C)  SpO2: 93%  Weight: 137 lb 6.4 oz (62.3 kg)  Height: '5\' 3"'$  (1.6 m)   Body mass index is 24.34 kg/m.  Advanced Directives 05/11/2021 03/10/2021 01/18/2021 11/10/2020 09/19/2020 09/05/2020 08/16/2020  Does Patient Have a Medical Advance Directive? No No No No Yes Yes Yes  Type of Advance Directive - - - - - - -  Does patient want to make changes to medical advance directive? No - Patient declined No - Patient declined No - Patient declined No - Patient declined No - Patient declined No - Patient declined No - Patient declined  Copy of [place] Would patient like information on creating a medical advance directive? - - - - - - -    Current Medications (verified) Outpatient Encounter Medications as of 06/12/2021  Medication Sig   calcium carbonate (TUMS - DOSED IN MG ELEMENTAL CALCIUM) 500 MG chewable tablet Chew 1 tablet by mouth daily.   Cholecalciferol 1000 units tablet Take 1,000 Units by mouth daily. hypocalcemia   Cyanocobalamin (VITAMIN B-12) 1000 MCG SUBL Place 1 tablet under the tongue at bedtime.   donepezil (ARICEPT)  10 MG tablet Take 1 tablet by mouth at bedtime.   NON FORMULARY NAS dieT   Nutritional Supplements (ENSURE ENLIVE PO) Take 1.5 kcal by mouth 2 (two) times daily between meals.   Nutritional Supplements (NUTRITIONAL SUPPLEMENT PO) Take 1 each by mouth at bedtime. Magic Cup   polyethylene glycol (MIRALAX / GLYCOLAX) packet Take 17 g by mouth daily as needed for mild constipation or moderate constipation.    sodium chloride irrigation 0.9 % irrigation Irrigate with 5 mLs as directed daily. Special Instructions: flush cholecystostomy daily   valACYclovir (VALTREX) 500 MG tablet Take 500 mg by mouth daily. Special Instructions: HSV suppression   No facility-administered encounter medications on file as of 06/12/2021.    Allergies (verified) Patient has no known allergies.   History: Past Medical History:  Diagnosis Date   Breast cancer ([place]    Cognitive communication deficit    Dementia ([place]    Difficulty in walking(719.7)    Fall    HTN (hypertension) 08/26/2016   Muscle weakness (generalized)    Rash and nonspecific skin eruption    Thyroid disease    hypo   Urinary tract infection, site not specified    Vitamin B deficiency    Past Surgical History:  Procedure Laterality Date   COLON SURGERY     IR CATHETER TUBE CHANGE  05/07/2017   IR EXCHANGE BILIARY DRAIN  07/02/2017   IR EXCHANGE BILIARY DRAIN  07/10/2017   IR EXCHANGE BILIARY DRAIN  08/28/2017   IR EXCHANGE BILIARY DRAIN  10/11/2017   IR EXCHANGE BILIARY DRAIN  10/28/2017   IR EXCHANGE BILIARY DRAIN  12/23/2017   IR EXCHANGE BILIARY DRAIN  01/23/2018   IR EXCHANGE BILIARY DRAIN  02/17/2018   IR EXCHANGE BILIARY DRAIN  03/20/2018   IR EXCHANGE BILIARY DRAIN  04/17/2018   IR EXCHANGE BILIARY DRAIN  05/29/2018   IR EXCHANGE BILIARY DRAIN  07/24/2018   IR EXCHANGE BILIARY DRAIN  09/22/2018   IR EXCHANGE BILIARY DRAIN  11/20/2018   IR EXCHANGE BILIARY DRAIN  02/11/2019   IR EXCHANGE BILIARY DRAIN  04/14/2019   IR EXCHANGE BILIARY DRAIN   06/09/2019   IR EXCHANGE BILIARY DRAIN  11/26/2019   IR EXCHANGE BILIARY DRAIN  12/23/2019   IR EXCHANGE BILIARY DRAIN  02/22/2020   IR EXCHANGE BILIARY DRAIN  05/25/2020   IR EXCHANGE BILIARY DRAIN  07/29/2020   IR EXCHANGE BILIARY DRAIN  08/30/2020   IR EXCHANGE BILIARY DRAIN  10/06/2020   IR EXCHANGE BILIARY DRAIN  11/18/2020   IR EXCHANGE BILIARY DRAIN  12/21/2020   IR EXCHANGE BILIARY DRAIN  02/03/2021   IR EXCHANGE BILIARY DRAIN  03/07/2021   IR EXCHANGE BILIARY DRAIN  05/04/2021   IR PERC CHOLECYSTOSTOMY  01/27/2017   IR PERC CHOLECYSTOSTOMY  11/18/2019   IR RADIOLOGIST EVAL & MGMT  03/05/2017   IR SINUS/FIST TUBE CHK-NON GI  10/06/2020   MASTECTOMY, PARTIAL Right    Family History  Problem Relation Age of Onset   Cancer Father        ? primary   Diabetes Neg Hx    Heart disease Neg Hx    Stroke Neg Hx    Social History   Socioeconomic History   Marital status: Widowed    Spouse name: Not on file   Number of children: Not on file   Years of education: Not on file   Highest education level: Not on file  Occupational History   Not on file  Tobacco Use   Smoking status: Never   Smokeless tobacco: Never  Vaping Use   Vaping Use: Never used  Substance and Sexual Activity   Alcohol use: No   Drug use: No   Sexual activity: Not on file  Other Topics Concern   Not on file  Social History Narrative   Not on file   Social Determinants of Health   Financial Resource Strain: Not on file  Food Insecurity: Not on file  Transportation Needs: Not on file  Physical Activity: Not on file  Stress: Not on file  Social Connections: Not on file    Tobacco Counseling Counseling given: Not Answered   Clinical Intake:  Pre-visit preparation completed: Yes  Pain : No/denies pain     BMI - recorded: 24.34 Nutritional Status: BMI of 19-24  Normal Nutritional Risks: Unintentional weight loss Diabetes: No  How often do you need to have someone help you when you read instructions,  pamphlets, or other written materials from your doctor or pharmacy?: 5 - [place] Needed?: No      Activities of Daily Living In your present state of health, do you have any difficulty performing the following activities: 06/12/2021  Hearing? N  Vision? N  Difficulty concentrating or making decisions? Y  Walking or climbing stairs? Y  Dressing or bathing? Y  Doing errands, shopping? Y  Preparing Food and eating ? Y  Using the Toilet? Y  In the past six months,  have you accidently leaked urine? Y  Do you have problems with loss of bowel control? Y  Managing your Medications? Y  Managing your Finances? Y  Housekeeping or managing your Housekeeping? Y  Some recent data might be hidden    Patient Care Team: [name], NP as PCP - General ([place], [place] ([place])  Indicate any recent [place] you may have received from other than [facility] providers in the past year (date may be approximate).     Assessment:   This is a routine wellness examination for Alyxia.  Hearing/Vision screen No results found.  Dietary issues and exercise activities discussed: Current Exercise Habits: The patient does not participate in regular exercise at present   Goals Addressed             This Visit's Progress    DIET - [place]   On track    Follow up with Provider as scheduled   On track    General - Client will not be readmitted within 30 days ([facility])   On track      Depression Screen PHQ 2/9 Scores 06/12/2021 06/06/2020 09/29/2019 07/21/2018 07/11/2017  PHQ - 2 Score 0 0 0 0 2  PHQ- 9 Score - - - - 3    Fall Risk Fall Risk  06/12/2021 06/06/2020 09/29/2019 07/21/2018 07/11/2017  Falls in the past year? 0 1 0 No No  Number falls in past yr: 0 0 0 - -  Injury with Fall? 0 0 0 - -  Risk for fall due to : - History of fall(s);Impaired balance/gait;Impaired mobility - - -  Follow up - Falls  evaluation completed - - -    FALL RISK PREVENTION PERTAINING TO THE HOME:  Any stairs in or around the home? No  If so, are there any without handrails? No  Home free of loose throw rugs in walkways, pet beds, electrical cords, etc? No  Adequate lighting in your home to reduce risk of falls? Yes   ASSISTIVE DEVICES UTILIZED TO PREVENT FALLS:  Life alert? No  Use of a cane, walker or w/c? Yes  Grab bars in the bathroom? Yes  Shower chair or bench in shower? Yes  Elevated toilet seat or a handicapped toilet? Yes   TIMED UP AND GO:  Was the test performed? No .  Length of time to ambulate 10 feet: 10  sec.   Gait slow and steady with assistive device  Cognitive Function: MMSE - Mini Mental State Exam 06/12/2021  Orientation to time 3  Orientation to Place 3  Registration 3  Attention/ Calculation 3  Recall 2  Language- name 2 objects 2  Language- repeat 1  Language- follow 3 step command 3  Language- read & follow direction 1  Write a sentence 0  Copy design 0  Total score 21     6CIT Screen 06/12/2021 06/06/2020 07/21/2018 07/11/2017  What Year? 0 points 0 points 4 points 4 points  What month? 3 points 3 points 0 points 3 points  What time? 3 points 3 points 0 points 0 points  Count back from 20 4 points 4 points 0 points 0 points  Months in reverse 4 points 4 points 4 points 0 points  Repeat phrase 8 points 8 points 6 points 10 points  Total Score '22 22 14 17    '$ Immunizations Immunization History  Administered Date(s) Administered   Influenza-Unspecified 07/28/2014, 07/26/2017, 07/24/2018, 07/27/2019, 07/29/2020  Moderna Sars-Covid-2 Vaccination 10/28/2019, 02/17/2020, 08/25/2020   Pneumococcal Conjugate-13 06/21/2017   Pneumococcal-Unspecified 08/01/2016   Tdap 07/11/2017    TDAP status: Up to date  Flu Vaccine status: Up to date  Pneumococcal vaccine status: Up to date  Covid-19 vaccine status: Completed vaccines  Qualifies for Shingles Vaccine? Yes    Zostavax completed No   Shingrix Completed?: No.    Education has been provided regarding the importance of this vaccine. Patient has been advised to call insurance company to determine out of pocket expense if they have not yet received this vaccine. Advised may also receive vaccine at local pharmacy or Health Dept. Verbalized acceptance and understanding.  Screening Tests Health Maintenance  Topic Date Due   Zoster Vaccines- Shingrix (1 of 2) Never done   COVID-19 Vaccine (4 - Booster for Moderna series) 11/25/2020   INFLUENZA VACCINE  05/22/2021   TETANUS/TDAP  07/12/2027   DEXA SCAN  Completed   PNA vac Low Risk Adult  Completed   HPV VACCINES  Aged Out    Health Maintenance  Health Maintenance Due  Topic Date Due   Zoster Vaccines- Shingrix (1 of 2) Never done   COVID-19 Vaccine (4 - Booster for Moderna series) 11/25/2020   INFLUENZA VACCINE  05/22/2021    Colorectal cancer screening: No longer required.   Mammogram status: No longer required due to age.  Bone Density status: Ordered  . Pt provided with contact info and advised to call to schedule appt.  Lung Cancer Screening: (Low Dose CT Chest recommended if Age 59-80 years, 30 pack-year currently smoking OR have quit w/in 15years.) does not qualify.   Lung Cancer Screening Referral: n/a   Additional Screening:  Hepatitis C Screening: does not qualify; Completed n/a  Vision Screening: Recommended annual ophthalmology exams for early detection of glaucoma and other disorders of the eye. Is the patient up to date with their annual eye exam?  Yes  Who is the provider or what is the name of the office in which the patient attends annual eye exams?  If pt is not established with a provider, would they like to be referred to a provider to establish care? Yes .   Dental Screening: Recommended annual dental exams for proper oral hygiene  Community Resource Referral / Chronic Care Management: CRR required this visit?  No    CCM required this visit?  No      Plan:     I have personally reviewed and noted the following in the patient's chart:   Medical and social history Use of alcohol, tobacco or illicit drugs  Current medications and supplements including opioid prescriptions.  Functional ability and status Nutritional status Physical activity Advanced directives List of other physicians Hospitalizations, surgeries, and ER visits in previous 12 months Vitals Screenings to include cognitive, depression, and falls Referrals and appointments  In addition, I have reviewed and discussed with patient certain preventive protocols, quality metrics, and best practice recommendations. A written personalized care plan for preventive services as well as general preventive health recommendations were provided to patient.     [name], NP   06/12/2021   Nurse Notes:

## 2021-06-12 NOTE — Progress Notes (Signed)
Location:  [facility] Room Number: 141-D Place of Service:  SNF (31)   CODE STATUS: DNR  No Known Allergies  Chief Complaint  Patient presents with   Medical Management of Chronic Issues    Annual Wellness Exam    HPI:    Past Medical History:  Diagnosis Date   Breast cancer ([place])    Cognitive communication deficit    Dementia ([place])    Difficulty in walking(719.7)    Fall    HTN (hypertension) 08/26/2016   Muscle weakness (generalized)    Rash and nonspecific skin eruption    Thyroid disease    hypo   Urinary tract infection, site not specified    Vitamin B deficiency     Past Surgical History:  Procedure Laterality Date   COLON SURGERY     IR CATHETER TUBE CHANGE  05/07/2017   IR EXCHANGE BILIARY DRAIN  07/02/2017   IR EXCHANGE BILIARY DRAIN  07/10/2017   IR EXCHANGE BILIARY DRAIN  08/28/2017   IR EXCHANGE BILIARY DRAIN  10/11/2017   IR EXCHANGE BILIARY DRAIN  10/28/2017   IR EXCHANGE BILIARY DRAIN  12/23/2017   IR EXCHANGE BILIARY DRAIN  01/23/2018   IR EXCHANGE BILIARY DRAIN  02/17/2018   IR EXCHANGE BILIARY DRAIN  03/20/2018   IR EXCHANGE BILIARY DRAIN  04/17/2018   IR EXCHANGE BILIARY DRAIN  05/29/2018   IR EXCHANGE BILIARY DRAIN  07/24/2018   IR EXCHANGE BILIARY DRAIN  09/22/2018   IR EXCHANGE BILIARY DRAIN  11/20/2018   IR EXCHANGE BILIARY DRAIN  02/11/2019   IR EXCHANGE BILIARY DRAIN  04/14/2019   IR EXCHANGE BILIARY DRAIN  06/09/2019   IR EXCHANGE BILIARY DRAIN  11/26/2019   IR EXCHANGE BILIARY DRAIN  12/23/2019   IR EXCHANGE BILIARY DRAIN  02/22/2020   IR EXCHANGE BILIARY DRAIN  05/25/2020   IR EXCHANGE BILIARY DRAIN  07/29/2020   IR EXCHANGE BILIARY DRAIN  08/30/2020   IR EXCHANGE BILIARY DRAIN  10/06/2020   IR EXCHANGE BILIARY DRAIN  11/18/2020   IR EXCHANGE BILIARY DRAIN  12/21/2020   IR EXCHANGE BILIARY DRAIN  02/03/2021   IR EXCHANGE BILIARY DRAIN  03/07/2021   IR EXCHANGE BILIARY DRAIN  05/04/2021   IR PERC CHOLECYSTOSTOMY  01/27/2017   IR PERC  CHOLECYSTOSTOMY  11/18/2019   IR RADIOLOGIST EVAL & MGMT  03/05/2017   IR SINUS/FIST TUBE CHK-NON GI  10/06/2020   MASTECTOMY, PARTIAL Right     Social History   Socioeconomic History   Marital status: Widowed    Spouse name: Not on file   Number of children: Not on file   Years of education: Not on file   Highest education level: Not on file  Occupational History   Not on file  Tobacco Use   Smoking status: Never   Smokeless tobacco: Never  Vaping Use   Vaping Use: Never used  Substance and Sexual Activity   Alcohol use: No   Drug use: No   Sexual activity: Not on file  Other Topics Concern   Not on file  Social History Narrative   Not on file   Social Determinants of Health   Financial Resource Strain: Not on file  Food Insecurity: Not on file  Transportation Needs: Not on file  Physical Activity: Not on file  Stress: Not on file  Social Connections: Not on file  Intimate Partner Violence: Not on file   Family History  Problem Relation Age of Onset  Cancer Father        ? primary   Diabetes Neg Hx    Heart disease Neg Hx    Stroke Neg Hx       VITAL SIGNS BP 130/70   Pulse 74   Temp 98.3 F (36.8 C)   Resp (!) 21   Ht '5\' 3"'$  (1.6 m)   Wt 137 lb 6.4 oz (62.3 kg)   SpO2 93%   BMI 24.34 kg/m   Outpatient Encounter Medications as of 06/12/2021  Medication Sig   calcium carbonate (TUMS - DOSED IN MG ELEMENTAL CALCIUM) 500 MG chewable tablet Chew 1 tablet by mouth daily.   Cholecalciferol 1000 units tablet Take 1,000 Units by mouth daily. hypocalcemia   Cyanocobalamin (VITAMIN B-12) 1000 MCG SUBL Place 1 tablet under the tongue at bedtime.   donepezil (ARICEPT) 10 MG tablet Take 1 tablet by mouth at bedtime.   NON FORMULARY NAS dieT   Nutritional Supplements (ENSURE ENLIVE PO) Take 1.5 kcal by mouth 2 (two) times daily between meals.   Nutritional Supplements (NUTRITIONAL SUPPLEMENT PO) Take 1 each by mouth at bedtime. Magic Cup   polyethylene glycol  (MIRALAX / GLYCOLAX) packet Take 17 g by mouth daily as needed for mild constipation or moderate constipation.    sodium chloride irrigation 0.9 % irrigation Irrigate with 5 mLs as directed daily. Special Instructions: flush cholecystostomy daily   valACYclovir (VALTREX) 500 MG tablet Take 500 mg by mouth daily. Special Instructions: HSV suppression   No facility-administered encounter medications on file as of 06/12/2021.     SIGNIFICANT DIAGNOSTIC EXAMS       ASSESSMENT/ PLAN:     Ok Edwards NP [facility]  Contact [phone] Monday through Friday 8am- 5pm  After hours call [phone]

## 2021-06-21 ENCOUNTER — Other Ambulatory Visit: Payer: Self-pay | Admitting: Radiology

## 2021-06-22 ENCOUNTER — Inpatient Hospital Stay (HOSPITAL_COMMUNITY)
Admit: 2021-06-22 | Discharge: 2021-06-22 | Disposition: A | Discharge status: Home / Self Care | Payer: Medicare Other | Attending: Interventional Radiology | Admitting: Interventional Radiology

## 2021-06-22 DIAGNOSIS — Z434 Encounter for attention to other artificial openings of digestive tract: Secondary | ICD-10-CM | POA: Diagnosis not present

## 2021-06-22 HISTORY — PX: IR EXCHANGE BILIARY DRAIN: IMG6046

## 2021-06-22 MED ORDER — LIDOCAINE HCL (PF) 1 % IJ SOLN
INTRAMUSCULAR | Status: DC | PRN
  Administered 2021-06-22: 5 mL

## 2021-06-22 MED ORDER — IOHEXOL 240 MG/ML SOLN
50.0000 mL | Freq: Once | INTRAMUSCULAR | Status: AC | PRN
  Administered 2021-06-22: 10 mL

## 2021-06-22 MED ORDER — LIDOCAINE HCL 1 % IJ SOLN
INTRAMUSCULAR | Status: AC
  Filled 2021-06-22: qty 20

## 2021-06-22 NOTE — Progress Notes (Addendum)
Interventional Radiology Brief Note:  Cynthia [name] presents to [facility] today for cholecystostomy tube exchange.  She does have a history of dementia, however is alert and communicative today.  She is able to demonstrate understanding of the perc chole tube as well as the need for it to remain in place.  She is agreeable to exchange.  Also attempted to call daughter, Barnetta Chapel, however no answer.   Updated: Spoke with daughter Barnetta Chapel who agrees to percutaneous cholecystostomy tube exchange as is routine for her mother at this time.  No concerns.    Brynda Greathouse, MS RD PA-C 11:01 AM

## 2021-06-22 NOTE — Procedures (Signed)
Interventional Radiology Procedure Note  Procedure: chole exchg    Complications: None  Estimated Blood Loss:  min  Findings: 12 fr chole tube exchg'd     Tamera Punt, MD

## 2021-06-28 ENCOUNTER — Non-Acute Institutional Stay (SKILLED_NURSING_FACILITY): Payer: Medicare Other | Admitting: Adult Health

## 2021-06-28 ENCOUNTER — Encounter: Payer: Self-pay | Admitting: Adult Health

## 2021-06-28 DIAGNOSIS — I10 Essential (primary) hypertension: Secondary | ICD-10-CM

## 2021-06-28 DIAGNOSIS — K5909 Other constipation: Secondary | ICD-10-CM

## 2021-06-28 DIAGNOSIS — B009 Herpesviral infection, unspecified: Secondary | ICD-10-CM | POA: Diagnosis not present

## 2021-06-28 NOTE — Progress Notes (Signed)
Location:  [place] Room Number: 141 Place of Service:  SNF (31)   CODE STATUS: full code   No Known Allergies  Chief Complaint  Patient presents with   Medical Management of Chronic Issues        Chronic herpes:   Essential hypertension:  Chronic constipation:     HPI:  She is a 87 year old long term resident of this facility being seen for the management of her chronic illnesses  Chronic herpes:   Essential hypertension:  Chronic constipation. There are no reports of uncontrolled pain; no reports of changes in appetite; no reports of insomnia; no reports of depressive thoughts.    Past Medical History:  Diagnosis Date   Breast cancer [facility])    Cognitive communication deficit    Dementia ([place])    Difficulty in walking(719.7)    Fall    HTN (hypertension) 08/26/2016   Muscle weakness (generalized)    Rash and nonspecific skin eruption    Thyroid disease    hypo   Urinary tract infection, site not specified    Vitamin B deficiency     Past Surgical History:  Procedure Laterality Date   COLON SURGERY     IR CATHETER TUBE CHANGE  05/07/2017   IR EXCHANGE BILIARY DRAIN  07/02/2017   IR EXCHANGE BILIARY DRAIN  07/10/2017   IR EXCHANGE BILIARY DRAIN  08/28/2017   IR EXCHANGE BILIARY DRAIN  10/11/2017   IR EXCHANGE BILIARY DRAIN  10/28/2017   IR EXCHANGE BILIARY DRAIN  12/23/2017   IR EXCHANGE BILIARY DRAIN  01/23/2018   IR EXCHANGE BILIARY DRAIN  02/17/2018   IR EXCHANGE BILIARY DRAIN  03/20/2018   IR EXCHANGE BILIARY DRAIN  04/17/2018   IR EXCHANGE BILIARY DRAIN  05/29/2018   IR EXCHANGE BILIARY DRAIN  07/24/2018   IR EXCHANGE BILIARY DRAIN  09/22/2018   IR EXCHANGE BILIARY DRAIN  11/20/2018   IR EXCHANGE BILIARY DRAIN  02/11/2019   IR EXCHANGE BILIARY DRAIN  04/14/2019   IR EXCHANGE BILIARY DRAIN  06/09/2019   IR EXCHANGE BILIARY DRAIN  11/26/2019   IR EXCHANGE BILIARY DRAIN  12/23/2019   IR EXCHANGE BILIARY DRAIN  02/22/2020   IR EXCHANGE BILIARY DRAIN  05/25/2020    IR EXCHANGE BILIARY DRAIN  07/29/2020   IR EXCHANGE BILIARY DRAIN  08/30/2020   IR EXCHANGE BILIARY DRAIN  10/06/2020   IR EXCHANGE BILIARY DRAIN  11/18/2020   IR EXCHANGE BILIARY DRAIN  12/21/2020   IR EXCHANGE BILIARY DRAIN  02/03/2021   IR EXCHANGE BILIARY DRAIN  03/07/2021   IR EXCHANGE BILIARY DRAIN  05/04/2021   IR EXCHANGE BILIARY DRAIN  06/22/2021   IR PERC CHOLECYSTOSTOMY  01/27/2017   IR PERC CHOLECYSTOSTOMY  11/18/2019   IR RADIOLOGIST EVAL & MGMT  03/05/2017   IR SINUS/FIST TUBE CHK-NON GI  10/06/2020   MASTECTOMY, PARTIAL Right     Social History   Socioeconomic History   Marital status: Widowed    Spouse name: Not on file   Number of children: Not on file   Years of education: Not on file   Highest education level: Not on file  Occupational History   Not on file  Tobacco Use   Smoking status: Never   Smokeless tobacco: Never  Vaping Use   Vaping Use: Never used  Substance and Sexual Activity   Alcohol use: No   Drug use: No   Sexual activity: Not on file  Other Topics Concern  Not on file  Social History Narrative   Not on file   Social Determinants of Health   Financial Resource Strain: Not on file  Food Insecurity: Not on file  Transportation Needs: Not on file  Physical Activity: Not on file  Stress: Not on file  Social Connections: Not on file  Intimate Partner Violence: Not on file   Family History  Problem Relation Age of Onset   Cancer Father        ? primary   Diabetes Neg Hx    Heart disease Neg Hx    Stroke Neg Hx       VITAL SIGNS BP 124/76   Pulse 70   Temp 98 F (36.7 C)   Ht '5\' 3"'$  (1.6 m)   Wt 135 lb (61.2 kg)   BMI 23.91 kg/m   Outpatient Encounter Medications as of 06/28/2021  Medication Sig   calcium carbonate (TUMS - DOSED IN MG ELEMENTAL CALCIUM) 500 MG chewable tablet Chew 1 tablet by mouth daily.   Cholecalciferol 1000 units tablet Take 1,000 Units by mouth daily. hypocalcemia   Cyanocobalamin (VITAMIN B-12) 1000 MCG  SUBL Place 1 tablet under the tongue at bedtime.   donepezil (ARICEPT) 10 MG tablet Take 1 tablet by mouth at bedtime.   NON FORMULARY NAS dieT   Nutritional Supplements (ENSURE ENLIVE PO) Take 1.5 kcal by mouth 2 (two) times daily between meals.   Nutritional Supplements (NUTRITIONAL SUPPLEMENT PO) Take 1 each by mouth at bedtime. Magic Cup   polyethylene glycol (MIRALAX / GLYCOLAX) packet Take 17 g by mouth daily as needed for mild constipation or moderate constipation.    sodium chloride irrigation 0.9 % irrigation Irrigate with 5 mLs as directed daily. Special Instructions: flush cholecystostomy daily   valACYclovir (VALTREX) 500 MG tablet Take 500 mg by mouth daily. Special Instructions: HSV suppression   No facility-administered encounter medications on file as of 06/28/2021.     SIGNIFICANT DIAGNOSTIC EXAMS  PREVIOUS;   11-13-19: ct of abdomen and pelvis:  1. Persistently thickened gallbladder wall with numerous gallstones, including one at the level of the gallbladder neck. Surrounding pericholecystic fat stranding without a well-defined fluid collection or evidence of abscess. Findings are concerning for acute on chronic cholecystitis. Consider nuclear medicine hepatobiliary scan to evaluate patency of the cystic and common bile ducts. 2. Extensive colonic diverticulosis without evidence of diverticulitis.  NO NEW EXAMS.    LABS REVIEWED: PREVIOUS:   01-23-21: wbc 5.0; hgb 12.3; hct 39.3; mcv 100.0 plt 236; glucose 89; bun 16; creat 0.86; k+ 4.6; na++ 141; ca 9.0 GFR>60; liver normal albumin 3.2 mag 2.0; tsh 3.260  NO NEW LABS.   Review of Systems  Constitutional:  Negative for malaise/fatigue.  Respiratory:  Negative for cough and shortness of breath.   Cardiovascular:  Negative for chest pain, palpitations and leg swelling.  Gastrointestinal:  Negative for abdominal pain, constipation and heartburn.  Musculoskeletal:  Negative for back pain, joint pain and myalgias.  Skin:  Negative.   Neurological:  Negative for dizziness.  Psychiatric/Behavioral:  The patient is not nervous/anxious.     Physical Exam Constitutional:      General: She is not in acute distress.    Appearance: She is well-developed. She is not diaphoretic.  Neck:     Thyroid: No thyromegaly.  Cardiovascular:     Rate and Rhythm: Normal rate and regular rhythm.     Heart sounds: Normal heart sounds.  Pulmonary:     Effort: Pulmonary  effort is normal. No respiratory distress.     Breath sounds: Normal breath sounds.  Chest:     Comments: Partial right mastectomy  Abdominal:     General: Bowel sounds are normal. There is no distension.     Palpations: Abdomen is soft.     Tenderness: There is no abdominal tenderness.     Comments: Chole drain   Musculoskeletal:        General: Normal range of motion.     Cervical back: Neck supple.     Right lower leg: No edema.     Left lower leg: No edema.  Lymphadenopathy:     Cervical: No cervical adenopathy.  Skin:    General: Skin is warm and dry.  Neurological:     Mental Status: She is alert. Mental status is at baseline.  Psychiatric:        Mood and Affect: Mood normal.      ASSESSMENT/ PLAN:  TODAY;   Chronic herpes: no recent outbreaks will continue valtrex 500 mg daily   2. Essential hypertension: is stable b/p 124/76 will monitor   3. Chronic constipation: is stable will continue miralax daily as needed.    PREVIOUS   4. History of breast cancer: is status post right partial mastectomy  5.  Aortic atherosclerosis (ct 08-26-19) will monitor  6. Calculus of gall bladder with out acute cholecystitis and obstruction: has chole drain last changed 06-22-21  7. Dementia without behavioral disturbance unspecified dementia type: is without change weight is stable at 135 pounds will continue aricept 10 mg daily    Ok Edwards NP [facility]  Contact [phone] Monday through Friday 8am- 5pm  After hours call  [phone]

## 2021-07-04 ENCOUNTER — Ambulatory Visit (HOSPITAL_COMMUNITY)
Admission: RE | Admit: 2021-07-04 | Discharge: 2021-07-04 | DRG: 395 | Disposition: A | Discharge status: Home / Self Care | Payer: Medicare Other | Source: Ambulatory Visit | Attending: Interventional Radiology | Admitting: Interventional Radiology

## 2021-07-04 DIAGNOSIS — Z434 Encounter for attention to other artificial openings of digestive tract: Secondary | ICD-10-CM

## 2021-07-04 HISTORY — PX: IR EXCHANGE BILIARY DRAIN: IMG6046

## 2021-07-04 MED ORDER — LIDOCAINE HCL 1 % IJ SOLN
INTRAMUSCULAR | Status: AC
  Filled 2021-07-04: qty 20

## 2021-07-04 MED ORDER — IOHEXOL 240 MG/ML SOLN
50.0000 mL | Freq: Once | INTRAMUSCULAR | Status: AC | PRN
  Administered 2021-07-04: 10 mL via INTRAVENOUS

## 2021-07-04 NOTE — Procedures (Signed)
Pre procedural Dx: Chronic cholecystomy tube. Post procedural Dx: Same  Successful Fluoro guided replacement of a 12 Fr drainage catheter placement into the gallbladder lumen. Chole tube connected to gravity bag.  EBL: None Complications: None immediate  Ronny Bacon, MD Pager #: [phone]

## 2021-07-26 ENCOUNTER — Non-Acute Institutional Stay (SKILLED_NURSING_FACILITY): Payer: Medicare Other | Admitting: Adult Health

## 2021-07-26 ENCOUNTER — Encounter: Payer: Self-pay | Admitting: Adult Health

## 2021-07-26 DIAGNOSIS — Z853 Personal history of malignant neoplasm of breast: Secondary | ICD-10-CM | POA: Diagnosis not present

## 2021-07-26 DIAGNOSIS — Z23 Encounter for immunization: Secondary | ICD-10-CM | POA: Diagnosis not present

## 2021-07-26 DIAGNOSIS — I700 Atherosclerosis of aorta: Secondary | ICD-10-CM

## 2021-07-26 DIAGNOSIS — K8061 Calculus of gallbladder and bile duct with cholecystitis, unspecified, with obstruction: Secondary | ICD-10-CM | POA: Diagnosis not present

## 2021-07-27 ENCOUNTER — Other Ambulatory Visit (HOSPITAL_COMMUNITY)
Admission: RE | Admit: 2021-07-27 | Discharge: 2021-07-27 | Disposition: A | Discharge status: Home / Self Care | Payer: Medicare Other | Source: Skilled Nursing Facility | Attending: Adult Health | Admitting: Adult Health

## 2021-07-27 DIAGNOSIS — I10 Essential (primary) hypertension: Secondary | ICD-10-CM | POA: Diagnosis not present

## 2021-07-27 LAB — COMPREHENSIVE METABOLIC PANEL
ALT: 9 U/L (ref 0–44)
AST: 14 U/L — ABNORMAL LOW (ref 15–41)
Albumin: 3 g/dL — ABNORMAL LOW (ref 3.5–5.0)
Alkaline Phosphatase: 67 U/L (ref 38–126)
Anion gap: 6 (ref 5–15)
BUN: 16 mg/dL (ref 8–23)
CO2: 29 mmol/L (ref 22–32)
Calcium: 8.4 mg/dL — ABNORMAL LOW (ref 8.9–10.3)
Chloride: 101 mmol/L (ref 98–111)
Creatinine, Ser: 0.74 mg/dL (ref 0.44–1.00)
GFR, Estimated: >60 mL/min (ref >60)
Glucose, Bld: 86 mg/dL (ref 70–99)
Potassium: 3.9 mmol/L (ref 3.5–5.1)
Sodium: 136 mmol/L (ref 135–145)
Total Bilirubin: 0.6 mg/dL (ref 0.3–1.2)
Total Protein: 6.7 g/dL (ref 6.5–8.1)

## 2021-07-27 LAB — CBC
HCT: 36.7 % (ref 36.0–46.0)
Hemoglobin: 12 g/dL (ref 12.0–15.0)
MCH: 33.1 pg (ref 26.0–34.0)
MCHC: 32.7 g/dL (ref 30.0–36.0)
MCV: 101.1 fL — ABNORMAL HIGH (ref 80.0–100.0)
Platelets: 281 10*3/uL (ref 150–400)
RBC: 3.63 MIL/uL — ABNORMAL LOW (ref 3.87–5.11)
RDW: 16.3 % — ABNORMAL HIGH (ref 11.5–15.5)
WBC: 5.1 10*3/uL (ref 4.0–10.5)
nRBC: 0 % (ref 0.0–0.2)

## 2021-07-28 DIAGNOSIS — Z23 Encounter for immunization: Secondary | ICD-10-CM | POA: Diagnosis not present

## 2021-07-31 NOTE — Progress Notes (Signed)
Location:  [place] Room Number: 141-D Place of Service:  SNF (31)   CODE STATUS: full code   No Known Allergies  Chief Complaint  Patient presents with   Medical Management of Chronic Issues               History of breast cancer:   Aortic atherosclerosis  Calculus of gall bladder without acute cholecystitis and obstruction    HPI:  She is a 87 year old long term resident of this facility being seen for the management of her chronic illnesses:  History of breast cancer:   Aortic atherosclerosis  Calculus of gall bladder without acute cholecystitis and obstruction. There are no reports of uncontrolled pain. She has had her chole drain changed. There are no reports of changes in appetite. Her weight is stable   Past Medical History:  Diagnosis Date   Breast cancer ([place])    Cognitive communication deficit    Dementia ([place])    Difficulty in walking(719.7)    Fall    HTN (hypertension) 08/26/2016   Muscle weakness (generalized)    Rash and nonspecific skin eruption    Thyroid disease    hypo   Urinary tract infection, site not specified    Vitamin B deficiency     Past Surgical History:  Procedure Laterality Date   COLON SURGERY     IR CATHETER TUBE CHANGE  05/07/2017   IR EXCHANGE BILIARY DRAIN  07/02/2017   IR EXCHANGE BILIARY DRAIN  07/10/2017   IR EXCHANGE BILIARY DRAIN  08/28/2017   IR EXCHANGE BILIARY DRAIN  10/11/2017   IR EXCHANGE BILIARY DRAIN  10/28/2017   IR EXCHANGE BILIARY DRAIN  12/23/2017   IR EXCHANGE BILIARY DRAIN  01/23/2018   IR EXCHANGE BILIARY DRAIN  02/17/2018   IR EXCHANGE BILIARY DRAIN  03/20/2018   IR EXCHANGE BILIARY DRAIN  04/17/2018   IR EXCHANGE BILIARY DRAIN  05/29/2018   IR EXCHANGE BILIARY DRAIN  07/24/2018   IR EXCHANGE BILIARY DRAIN  09/22/2018   IR EXCHANGE BILIARY DRAIN  11/20/2018   IR EXCHANGE BILIARY DRAIN  02/11/2019   IR EXCHANGE BILIARY DRAIN  04/14/2019   IR EXCHANGE BILIARY DRAIN  06/09/2019   IR EXCHANGE BILIARY  DRAIN  11/26/2019   IR EXCHANGE BILIARY DRAIN  12/23/2019   IR EXCHANGE BILIARY DRAIN  02/22/2020   IR EXCHANGE BILIARY DRAIN  05/25/2020   IR EXCHANGE BILIARY DRAIN  07/29/2020   IR EXCHANGE BILIARY DRAIN  08/30/2020   IR EXCHANGE BILIARY DRAIN  10/06/2020   IR EXCHANGE BILIARY DRAIN  11/18/2020   IR EXCHANGE BILIARY DRAIN  12/21/2020   IR EXCHANGE BILIARY DRAIN  02/03/2021   IR EXCHANGE BILIARY DRAIN  03/07/2021   IR EXCHANGE BILIARY DRAIN  05/04/2021   IR EXCHANGE BILIARY DRAIN  06/22/2021   IR EXCHANGE BILIARY DRAIN  07/04/2021   IR PERC CHOLECYSTOSTOMY  01/27/2017   IR PERC CHOLECYSTOSTOMY  11/18/2019   IR RADIOLOGIST EVAL & MGMT  03/05/2017   IR SINUS/FIST TUBE CHK-NON GI  10/06/2020   MASTECTOMY, PARTIAL Right     Social History   Socioeconomic History   Marital status: Widowed    Spouse name: Not on file   Number of children: Not on file   Years of education: Not on file   Highest education level: Not on file  Occupational History   Not on file  Tobacco Use   Smoking status: Never   Smokeless tobacco: Never  Vaping Use   Vaping Use: Never used  Substance and Sexual Activity   Alcohol use: No   Drug use: No   Sexual activity: Not on file  Other Topics Concern   Not on file  Social History Narrative   Not on file   Social Determinants of Health   Financial Resource Strain: Not on file  Food Insecurity: Not on file  Transportation Needs: Not on file  Physical Activity: Not on file  Stress: Not on file  Social Connections: Not on file  Intimate Partner Violence: Not on file   Family History  Problem Relation Age of Onset   Cancer Father        ? primary   Diabetes Neg Hx    Heart disease Neg Hx    Stroke Neg Hx       VITAL SIGNS BP 126/73   Pulse 80   Temp 98.3 F (36.8 C)   Resp (!) 21   Ht 5\' 3"  (1.6 m)   Wt 135 lb (61.2 kg)   SpO2 93%   BMI 23.91 kg/m   Outpatient Encounter Medications as of 07/26/2021  Medication Sig   calcium carbonate (TUMS - DOSED IN  MG ELEMENTAL CALCIUM) 500 MG chewable tablet Chew 1 tablet by mouth daily.   Cholecalciferol 1000 units tablet Take 1,000 Units by mouth daily. hypocalcemia   Cyanocobalamin (VITAMIN B-12) 1000 MCG SUBL Place 1 tablet under the tongue at bedtime.   donepezil (ARICEPT) 10 MG tablet Take 1 tablet by mouth at bedtime.   NON FORMULARY NAS dieT   Nutritional Supplements (ENSURE ENLIVE PO) Take 1.5 kcal by mouth 2 (two) times daily between meals.   Nutritional Supplements (NUTRITIONAL SUPPLEMENT PO) Take 1 each by mouth at bedtime. Magic Cup   polyethylene glycol (MIRALAX / GLYCOLAX) packet Take 17 g by mouth daily as needed for mild constipation or moderate constipation.    sodium chloride irrigation 0.9 % irrigation Irrigate with 5 mLs as directed daily. Special Instructions: flush cholecystostomy daily   valACYclovir (VALTREX) 500 MG tablet Take 500 mg by mouth daily. Special Instructions: HSV suppression   No facility-administered encounter medications on file as of 07/26/2021.     SIGNIFICANT DIAGNOSTIC EXAMS   NO NEW EXAMS.    LABS REVIEWED: PREVIOUS:   01-23-21: wbc 5.0; hgb 12.3; hct 39.3; mcv 100.0 plt 236; glucose 89; bun 16; creat 0.86; k+ 4.6; na++ 141; ca 9.0 GFR>60; liver normal albumin 3.2 mag 2.0; tsh 3.260  NO NEW LABS.   Review of Systems  Constitutional:  Negative for malaise/fatigue.  Respiratory:  Negative for cough and shortness of breath.   Cardiovascular:  Negative for chest pain, palpitations and leg swelling.  Gastrointestinal:  Negative for abdominal pain, constipation and heartburn.  Musculoskeletal:  Negative for back pain, joint pain and myalgias.  Skin: Negative.   Neurological:  Negative for dizziness.  Psychiatric/Behavioral:  The patient is not nervous/anxious.      Physical Exam Constitutional:      General: She is not in acute distress.    Appearance: She is well-developed. She is not diaphoretic.  Neck:     Thyroid: No thyromegaly.   Cardiovascular:     Rate and Rhythm: Normal rate and regular rhythm.     Heart sounds: Normal heart sounds.  Pulmonary:     Effort: Pulmonary effort is normal. No respiratory distress.     Breath sounds: Normal breath sounds.  Chest:     Comments: Partial right mastectomy  Abdominal:     General: Bowel sounds are normal. There is no distension.     Palpations: Abdomen is soft.     Tenderness: There is no abdominal tenderness.     Comments: Chole drain  Musculoskeletal:        General: Normal range of motion.     Cervical back: Neck supple.     Right lower leg: No edema.     Left lower leg: No edema.  Lymphadenopathy:     Cervical: No cervical adenopathy.  Skin:    General: Skin is warm and dry.  Neurological:     Mental Status: She is alert. Mental status is at baseline.  Psychiatric:        Mood and Affect: Mood normal.       ASSESSMENT/ PLAN:  TODAY;   History of breast cancer: is status post right partial mastectomy   2. Aortic atherosclerosis (ct 08-26-19) will monitor   3. Calculus of gall bladder without acute cholecystitis and obstruction: has long term chole drain last changed 07-04-21   PREVIOUS   4.. Dementia without behavioral disturbance unspecified dementia type: is without change weight is stable at 135 pounds will continue aricept 10 mg daily  5. Chronic herpes: no recent outbreaks will continue valtrex 500 mg daily   6.  Essential hypertension: is stable b/p 126/73 will monitor   7. . Chronic constipation: is stable will continue miralax daily as needed.    Will check cbc; cmp     Ok Edwards NP [facility]  Contact [phone] Monday through Friday 8am- 5pm  After hours call [phone]

## 2021-08-10 ENCOUNTER — Ambulatory Visit (HOSPITAL_COMMUNITY)
Admission: RE | Admit: 2021-08-10 | Discharge: 2021-08-10 | DRG: 395 | Disposition: A | Discharge status: Home / Self Care | Payer: Medicare Other | Source: Ambulatory Visit | Attending: Interventional Radiology | Admitting: Interventional Radiology

## 2021-08-10 DIAGNOSIS — Z434 Encounter for attention to other artificial openings of digestive tract: Secondary | ICD-10-CM

## 2021-08-10 HISTORY — PX: IR EXCHANGE BILIARY DRAIN: IMG6046

## 2021-08-10 MED ORDER — IOHEXOL 300 MG/ML  SOLN
100.0000 mL | Freq: Once | INTRAMUSCULAR | Status: AC | PRN
  Administered 2021-08-10: 10 mL

## 2021-08-10 MED ORDER — LIDOCAINE HCL 1 % IJ SOLN
INTRAMUSCULAR | Status: AC
  Filled 2021-08-10: qty 20

## 2021-08-11 ENCOUNTER — Encounter: Payer: Self-pay | Admitting: Adult Health

## 2021-08-11 ENCOUNTER — Non-Acute Institutional Stay (SKILLED_NURSING_FACILITY): Payer: Medicare Other | Admitting: Adult Health

## 2021-08-11 DIAGNOSIS — E559 Vitamin D deficiency, unspecified: Secondary | ICD-10-CM | POA: Diagnosis not present

## 2021-08-11 DIAGNOSIS — I10 Essential (primary) hypertension: Secondary | ICD-10-CM | POA: Diagnosis not present

## 2021-08-11 DIAGNOSIS — I700 Atherosclerosis of aorta: Secondary | ICD-10-CM | POA: Diagnosis not present

## 2021-08-11 DIAGNOSIS — F01C0 Vascular dementia, severe, without behavioral disturbance, psychotic disturbance, mood disturbance, and anxiety: Secondary | ICD-10-CM

## 2021-08-11 NOTE — Progress Notes (Signed)
Location:  [place] Room Number: 141 Place of Service:  SNF (31)   CODE STATUS: full code   No Known Allergies  Chief Complaint  Patient presents with   Acute Visit    Care plan meeting     HPI:  We have come together for her care plan meeting. BIMS 5/15 mood 0/30.  She requires limited assist with dressing toileting and hygiene and extensive assist with bathing. She is occasionally incontinent of bladder and frequently incontinent of bowel. There have been no falls. Dietary: weight is 139 pounds; appetite is 50-75% of meals feeds self. Does take supplements. Has chronic bili drain last changed 08-10-21. She continues to be followed for her chronic illnesses including:   Aortic atherosclerosis  Severe vascular dementia without behavioral disturbance psychotic disturbance mood  disturbance or anxiety  Vitamin D deficiency   Primary hypertension  Past Medical History:  Diagnosis Date   Breast cancer ([place])    Cognitive communication deficit    Dementia ([place])    Difficulty in walking(719.7)    Fall    HTN (hypertension) 08/26/2016   Muscle weakness (generalized)    Rash and nonspecific skin eruption    Thyroid disease    hypo   Urinary tract infection, site not specified    Vitamin B deficiency     Past Surgical History:  Procedure Laterality Date   COLON SURGERY     IR CATHETER TUBE CHANGE  05/07/2017   IR EXCHANGE BILIARY DRAIN  07/02/2017   IR EXCHANGE BILIARY DRAIN  07/10/2017   IR EXCHANGE BILIARY DRAIN  08/28/2017   IR EXCHANGE BILIARY DRAIN  10/11/2017   IR EXCHANGE BILIARY DRAIN  10/28/2017   IR EXCHANGE BILIARY DRAIN  12/23/2017   IR EXCHANGE BILIARY DRAIN  01/23/2018   IR EXCHANGE BILIARY DRAIN  02/17/2018   IR EXCHANGE BILIARY DRAIN  03/20/2018   IR EXCHANGE BILIARY DRAIN  04/17/2018   IR EXCHANGE BILIARY DRAIN  05/29/2018   IR EXCHANGE BILIARY DRAIN  07/24/2018   IR EXCHANGE BILIARY DRAIN  09/22/2018   IR EXCHANGE BILIARY DRAIN  11/20/2018   IR  EXCHANGE BILIARY DRAIN  02/11/2019   IR EXCHANGE BILIARY DRAIN  04/14/2019   IR EXCHANGE BILIARY DRAIN  06/09/2019   IR EXCHANGE BILIARY DRAIN  11/26/2019   IR EXCHANGE BILIARY DRAIN  12/23/2019   IR EXCHANGE BILIARY DRAIN  02/22/2020   IR EXCHANGE BILIARY DRAIN  05/25/2020   IR EXCHANGE BILIARY DRAIN  07/29/2020   IR EXCHANGE BILIARY DRAIN  08/30/2020   IR EXCHANGE BILIARY DRAIN  10/06/2020   IR EXCHANGE BILIARY DRAIN  11/18/2020   IR EXCHANGE BILIARY DRAIN  12/21/2020   IR EXCHANGE BILIARY DRAIN  02/03/2021   IR EXCHANGE BILIARY DRAIN  03/07/2021   IR EXCHANGE BILIARY DRAIN  05/04/2021   IR EXCHANGE BILIARY DRAIN  06/22/2021   IR EXCHANGE BILIARY DRAIN  07/04/2021   IR EXCHANGE BILIARY DRAIN  08/10/2021   IR PERC CHOLECYSTOSTOMY  01/27/2017   IR PERC CHOLECYSTOSTOMY  11/18/2019   IR RADIOLOGIST EVAL & MGMT  03/05/2017   IR SINUS/FIST TUBE CHK-NON GI  10/06/2020   MASTECTOMY, PARTIAL Right     Social History   Socioeconomic History   Marital status: Widowed    Spouse name: Not on file   Number of children: Not on file   Years of education: Not on file   Highest education level: Not on file  Occupational History   Not  on file  Tobacco Use   Smoking status: Never   Smokeless tobacco: Never  Vaping Use   Vaping Use: Never used  Substance and Sexual Activity   Alcohol use: No   Drug use: No   Sexual activity: Not on file  Other Topics Concern   Not on file  Social History Narrative   Not on file   Social Determinants of Health   Financial Resource Strain: Not on file  Food Insecurity: Not on file  Transportation Needs: Not on file  Physical Activity: Not on file  Stress: Not on file  Social Connections: Not on file  Intimate Partner Violence: Not on file   Family History  Problem Relation Age of Onset   Cancer Father        ? primary   Diabetes Neg Hx    Heart disease Neg Hx    Stroke Neg Hx       VITAL SIGNS BP (!) 141/64   Pulse 78   Temp (!) 97.1 F (36.2 C)   Ht 5'  3" (1.6 m)   Wt 139 lb 3.2 oz (63.1 kg)   BMI 24.66 kg/m   Outpatient Encounter Medications as of 08/11/2021  Medication Sig   calcium carbonate (TUMS - DOSED IN MG ELEMENTAL CALCIUM) 500 MG chewable tablet Chew 1 tablet by mouth daily.   Cholecalciferol 1000 units tablet Take 1,000 Units by mouth daily. hypocalcemia   Cyanocobalamin (VITAMIN B-12) 1000 MCG SUBL Place 1 tablet under the tongue at bedtime.   donepezil (ARICEPT) 10 MG tablet Take 1 tablet by mouth at bedtime.   NON FORMULARY NAS dieT   Nutritional Supplements (ENSURE ENLIVE PO) Take 1.5 kcal by mouth 2 (two) times daily between meals.   Nutritional Supplements (NUTRITIONAL SUPPLEMENT PO) Take 1 each by mouth at bedtime. Magic Cup   polyethylene glycol (MIRALAX / GLYCOLAX) packet Take 17 g by mouth daily as needed for mild constipation or moderate constipation.    sodium chloride irrigation 0.9 % irrigation Irrigate with 5 mLs as directed daily. Special Instructions: flush cholecystostomy daily   valACYclovir (VALTREX) 500 MG tablet Take 500 mg by mouth daily. Special Instructions: HSV suppression   No facility-administered encounter medications on file as of 08/11/2021.     SIGNIFICANT DIAGNOSTIC EXAMS  NO NEW EXAMS.   LABS REVIEWED: PREVIOUS:   01-23-21: wbc 5.0; hgb 12.3; hct 39.3; mcv 100.0 plt 236; glucose 89; bun 16; creat 0.86; k+ 4.6; na++ 141; ca 9.0 GFR>60; liver normal albumin 3.2 mag 2.0; tsh 3.260  NO NEW LABS.  Review of Systems  Constitutional:  Negative for malaise/fatigue.  Respiratory:  Negative for cough and shortness of breath.   Cardiovascular:  Negative for chest pain, palpitations and leg swelling.  Gastrointestinal:  Negative for abdominal pain, constipation and heartburn.  Musculoskeletal:  Negative for back pain, joint pain and myalgias.  Skin: Negative.   Neurological:  Negative for dizziness.  Psychiatric/Behavioral:  The patient is not nervous/anxious.    Physical  Exam Constitutional:      General: She is not in acute distress.    Appearance: She is well-developed. She is not diaphoretic.  Neck:     Thyroid: No thyromegaly.  Cardiovascular:     Rate and Rhythm: Normal rate and regular rhythm.     Pulses: Normal pulses.     Heart sounds: Normal heart sounds.  Pulmonary:     Effort: Pulmonary effort is normal. No respiratory distress.     Breath sounds:  Normal breath sounds.  Chest:     Comments: Partial right mastectomy Abdominal:     General: Bowel sounds are normal. There is no distension.     Palpations: Abdomen is soft.     Tenderness: There is no abdominal tenderness.     Comments: Chole drain  Musculoskeletal:        General: Normal range of motion.     Cervical back: Neck supple.     Right lower leg: No edema.     Left lower leg: No edema.  Lymphadenopathy:     Cervical: No cervical adenopathy.  Skin:    General: Skin is warm and dry.  Neurological:     Mental Status: She is alert. Mental status is at baseline.  Psychiatric:        Mood and Affect: Mood normal.      ASSESSMENT/ PLAN:  TODAY  Aortic atherosclerosis Severe vascular dementia without behavioral disturbance psychotic disturbance mood  disturbance or anxiety Vitamin D deficiency  Primary hypertension  Will continue current medications Will continue current plan of care Will continue to monitor her status  Time spent with patient 40 minutes: medications; plan of care     Ok Edwards NP [facility]  Contact [phone] Monday through Friday 8am- 5pm  After hours call [phone]

## 2021-08-16 DIAGNOSIS — Z1159 Encounter for screening for other viral diseases: Secondary | ICD-10-CM | POA: Diagnosis not present

## 2021-08-29 ENCOUNTER — Encounter: Payer: Self-pay | Admitting: Internal Medicine

## 2021-08-29 ENCOUNTER — Non-Acute Institutional Stay (SKILLED_NURSING_FACILITY): Payer: Medicare Other | Admitting: Internal Medicine

## 2021-08-29 DIAGNOSIS — E538 Deficiency of other specified B group vitamins: Secondary | ICD-10-CM

## 2021-08-29 DIAGNOSIS — F01C0 Vascular dementia, severe, without behavioral disturbance, psychotic disturbance, mood disturbance, and anxiety: Secondary | ICD-10-CM

## 2021-08-29 DIAGNOSIS — Z434 Encounter for attention to other artificial openings of digestive tract: Secondary | ICD-10-CM

## 2021-08-29 DIAGNOSIS — E559 Vitamin D deficiency, unspecified: Secondary | ICD-10-CM

## 2021-08-29 DIAGNOSIS — I10 Essential (primary) hypertension: Secondary | ICD-10-CM | POA: Diagnosis not present

## 2021-08-29 NOTE — Assessment & Plan Note (Addendum)
BP controlled w/o antihypertensive medications  

## 2021-08-29 NOTE — Progress Notes (Signed)
NURSING HOME LOCATION:  [facility] ROOM NUMBER:  141  CODE STATUS:  Full Code  PCP: Ok Edwards NP  This is a nursing facility follow up visit of chronic medical diagnoses & to document compliance with Regulation 483.30 (c) in [employer] Manual Phase 2 which mandates caregiver visit ( visits can alternate among physician, PA or NP as per statutes) within 10 days of 30 days / 60 days/ 90 days post admission to SNF date    Interim medical record and care since last SNF visit was updated with review of diagnostic studies and change in clinical status since last visit were documented.  HPI: She is a permanent resident of the facility with medical diagnoses of history of breast cancer, essential hypertension, hypothyroidism, history of B12 deficiency, and dementia. Procedures include multiple biliary drainage exchange by IR and history of partial mastectomy. Most recent labs were reviewed; she has CKD stage II with a creatinine of 0.74 and GFR greater than 60.  TSH is therapeutic.  H/H low normal without anemia.  She does have persistent macrocytosis with MCV of 101.1.  Her most recent  Vit D ,25 OH & B12 levels were  therapeutic 02/15/2018. She is on B12  & vit D supplementation.  Review of systems: Dementia invalidated responses.  Response to queries was " nope".  She seems to be somewhat suspicious ,wanting to know why I wished to talk to her and examine her although I explained that I was the SNF Medical Director.  We had met previously but she did not remember.  She asked me where I lived & when I said "[place]"; she stated that she had lived there.  When I asked why she had moved to this facility, her response was "why ,do you need transportation".  When I attempted to examine her pedal pulses, she wanted to know "why are you poking around my feet".  When I explained I was checking her peripheral circulation she stated "that is a new one on me". I explained  this allowed me to assess vascular health. She responded " I doubt that!"  Physical exam:  Pertinent or positive findings: Initially she was coming back from the bathroom moving slowly employing a rolling walker. As noted her affect was somewhat wary and suspicious.  She exhibits somewhat of a scowl.  There is malalignment of the teeth.  Heart sounds are distant and rhythm minimally irregular.  Pedal pulses are decreased.  The biliary drainage bag was located at the right ankle.  General appearance: Adequately nourished; no acute distress, increased work of breathing is present.   Lymphatic: No lymphadenopathy about the head, neck, axilla. Eyes: No conjunctival inflammation or lid edema is present. There is no scleral icterus. Ears:  External ear exam shows no significant lesions or deformities.   Nose:  External nasal examination shows no deformity or inflammation. Nasal mucosa are pink and moist without lesions, exudates Oral exam:  Lips and gums are healthy appearing. There is no oropharyngeal erythema or exudate. Neck:  No thyromegaly, masses, tenderness noted.    Heart:  No gallop, murmur, click, rub .  Lungs: Chest clear to auscultation without wheezes, rhonchi, rales, rubs. Abdomen: Bowel sounds are normal. Abdomen is soft and nontender with no organomegaly, hernias, masses. GU: Deferred  Extremities:  No cyanosis, clubbing, edema  Skin: Warm & dry w/o tenting. No significant lesions or rash.  See summary under each active problem in the Problem List with  associated updated therapeutic plan

## 2021-08-29 NOTE — Patient Instructions (Signed)
See assessment and plan under each diagnosis in the problem list and acutely for this visit 

## 2021-08-29 NOTE — Assessment & Plan Note (Signed)
Last Vit D,25 OH level therapeutic 02/15/2018; she remains on daily supplement

## 2021-08-29 NOTE — Assessment & Plan Note (Addendum)
She clinically exhibits wariness and almost paranoia.  Her responses tend to be flippant and unfocused.

## 2021-08-29 NOTE — Assessment & Plan Note (Addendum)
Anemia has resolved but macrocytosis persists with a MCV of 101.1.  Last B12 level was 741 on 02/15/2018.  She is on daily supplementation orally. Update deferred to [facility] NP.

## 2021-08-29 NOTE — Assessment & Plan Note (Signed)
No complications with biliary drainage system reported.

## 2021-09-06 ENCOUNTER — Encounter (HOSPITAL_COMMUNITY): Payer: Self-pay

## 2021-09-06 ENCOUNTER — Ambulatory Visit (HOSPITAL_COMMUNITY)
Admit: 2021-09-06 | Discharge: 2021-09-06 | Disposition: A | Discharge status: Home / Self Care | Payer: Medicare Other | Attending: Interventional Radiology | Admitting: Interventional Radiology

## 2021-09-06 MED ORDER — LIDOCAINE HCL 1 % IJ SOLN
INTRAMUSCULAR | Status: AC
  Filled 2021-09-06: qty 20

## 2021-09-26 ENCOUNTER — Non-Acute Institutional Stay (SKILLED_NURSING_FACILITY): Payer: Medicare Other | Admitting: Adult Health

## 2021-09-26 ENCOUNTER — Encounter: Payer: Self-pay | Admitting: Adult Health

## 2021-09-26 DIAGNOSIS — F01C0 Vascular dementia, severe, without behavioral disturbance, psychotic disturbance, mood disturbance, and anxiety: Secondary | ICD-10-CM | POA: Diagnosis not present

## 2021-09-26 DIAGNOSIS — B009 Herpesviral infection, unspecified: Secondary | ICD-10-CM | POA: Diagnosis not present

## 2021-09-26 DIAGNOSIS — I10 Essential (primary) hypertension: Secondary | ICD-10-CM

## 2021-09-26 NOTE — Progress Notes (Signed)
Location:  [place] Room Number: 141-D Place of Service:  SNF (31)   CODE STATUS: Full Code   No Known Allergies  Chief Complaint  Patient presents with   Medical Management of Chronic Issues          Dementia without behavioral disturbance unspecified dementia type: Chronic herpes:  Essential hypertension    HPI:  She is a 87 year old long term resident of this facility being seen for the management of her chronic illnesses: Dementia without behavioral disturbance unspecified dementia type: Chronic herpes:  Essential hypertension. There are no reports of uncontrolled pain. She was not able to tolerate chole drain change as she was uncooperative. She will need pre-med prior to her next change. Her weight remains stable.   Past Medical History:  Diagnosis Date   Breast cancer ([place])    Cognitive communication deficit    Dementia ([place])    Difficulty in walking(719.7)    Fall    HTN (hypertension) 08/26/2016   Muscle weakness (generalized)    Rash and nonspecific skin eruption    Thyroid disease    hypo   Urinary tract infection, site not specified    Vitamin B deficiency     Past Surgical History:  Procedure Laterality Date   COLON SURGERY     IR CATHETER TUBE CHANGE  05/07/2017   IR EXCHANGE BILIARY DRAIN  07/02/2017   IR EXCHANGE BILIARY DRAIN  07/10/2017   IR EXCHANGE BILIARY DRAIN  08/28/2017   IR EXCHANGE BILIARY DRAIN  10/11/2017   IR EXCHANGE BILIARY DRAIN  10/28/2017   IR EXCHANGE BILIARY DRAIN  12/23/2017   IR EXCHANGE BILIARY DRAIN  01/23/2018   IR EXCHANGE BILIARY DRAIN  02/17/2018   IR EXCHANGE BILIARY DRAIN  03/20/2018   IR EXCHANGE BILIARY DRAIN  04/17/2018   IR EXCHANGE BILIARY DRAIN  05/29/2018   IR EXCHANGE BILIARY DRAIN  07/24/2018   IR EXCHANGE BILIARY DRAIN  09/22/2018   IR EXCHANGE BILIARY DRAIN  11/20/2018   IR EXCHANGE BILIARY DRAIN  02/11/2019   IR EXCHANGE BILIARY DRAIN  04/14/2019   IR EXCHANGE BILIARY DRAIN  06/09/2019   IR EXCHANGE  BILIARY DRAIN  11/26/2019   IR EXCHANGE BILIARY DRAIN  12/23/2019   IR EXCHANGE BILIARY DRAIN  02/22/2020   IR EXCHANGE BILIARY DRAIN  05/25/2020   IR EXCHANGE BILIARY DRAIN  07/29/2020   IR EXCHANGE BILIARY DRAIN  08/30/2020   IR EXCHANGE BILIARY DRAIN  10/06/2020   IR EXCHANGE BILIARY DRAIN  11/18/2020   IR EXCHANGE BILIARY DRAIN  12/21/2020   IR EXCHANGE BILIARY DRAIN  02/03/2021   IR EXCHANGE BILIARY DRAIN  03/07/2021   IR EXCHANGE BILIARY DRAIN  05/04/2021   IR EXCHANGE BILIARY DRAIN  06/22/2021   IR EXCHANGE BILIARY DRAIN  07/04/2021   IR EXCHANGE BILIARY DRAIN  08/10/2021   IR PERC CHOLECYSTOSTOMY  01/27/2017   IR PERC CHOLECYSTOSTOMY  11/18/2019   IR RADIOLOGIST EVAL & MGMT  03/05/2017   IR SINUS/FIST TUBE CHK-NON GI  10/06/2020   MASTECTOMY, PARTIAL Right     Social History   Socioeconomic History   Marital status: Widowed    Spouse name: Not on file   Number of children: Not on file   Years of education: Not on file   Highest education level: Not on file  Occupational History   Not on file  Tobacco Use   Smoking status: Never   Smokeless tobacco: Never  Vaping Use  Vaping Use: Never used  Substance and Sexual Activity   Alcohol use: No   Drug use: No   Sexual activity: Not on file  Other Topics Concern   Not on file  Social History Narrative   Not on file   Social Determinants of Health   Financial Resource Strain: Not on file  Food Insecurity: Not on file  Transportation Needs: Not on file  Physical Activity: Not on file  Stress: Not on file  Social Connections: Not on file  Intimate Partner Violence: Not on file   Family History  Problem Relation Age of Onset   Cancer Father        ? primary   Diabetes Neg Hx    Heart disease Neg Hx    Stroke Neg Hx       VITAL SIGNS BP (!) 145/73   Pulse 72   Temp (!) 97.1 F (36.2 C)   Resp (!) 21   Ht 5\' 3"  (1.6 m)   Wt 136 lb 9.6 oz (62 kg)   SpO2 93%   BMI 24.20 kg/m   Outpatient Encounter Medications as of  09/26/2021  Medication Sig   calcium carbonate (TUMS - DOSED IN MG ELEMENTAL CALCIUM) 500 MG chewable tablet Chew 1 tablet by mouth daily.   Cholecalciferol 1000 units tablet Take 1,000 Units by mouth daily. hypocalcemia   Cyanocobalamin (VITAMIN B-12) 1000 MCG SUBL Place 1 tablet under the tongue at bedtime.   donepezil (ARICEPT) 10 MG tablet Take 1 tablet by mouth at bedtime.   NON FORMULARY Regular diet   Nutritional Supplements (ENSURE ENLIVE PO) Take 1.5 kcal by mouth daily.   Nutritional Supplements (NUTRITIONAL SUPPLEMENT PO) Take 1 each by mouth at bedtime. Magic Cup   polyethylene glycol (MIRALAX / GLYCOLAX) packet Take 17 g by mouth daily as needed for mild constipation or moderate constipation.    sodium chloride irrigation 0.9 % irrigation Irrigate with 5 mLs as directed daily. Special Instructions: flush cholecystostomy daily   valACYclovir (VALTREX) 500 MG tablet Take 500 mg by mouth daily. Special Instructions: HSV suppression   No facility-administered encounter medications on file as of 09/26/2021.     SIGNIFICANT DIAGNOSTIC EXAMS   NO NEW EXAMS.   LABS REVIEWED: PREVIOUS:   01-23-21: wbc 5.0; hgb 12.3; hct 39.3; mcv 100.0 plt 236; glucose 89; bun 16; creat 0.86; k+ 4.6; na++ 141; ca 9.0 GFR>60; liver normal albumin 3.2 mag 2.0; tsh 3.260  TODAY  07-27-21: wbc 5.1; hgb 12.0; hct 36.7; mcv 101.1 plt 281; glucose 86; bun 16; creat 0.74; k+ 3.9; na++ 136; ca 8.4 GFR>60; liver normal albumin 3.0    Review of Systems  Constitutional:  Negative for malaise/fatigue.  Respiratory:  Negative for cough and shortness of breath.   Cardiovascular:  Negative for chest pain, palpitations and leg swelling.  Gastrointestinal:  Negative for abdominal pain, constipation and heartburn.  Musculoskeletal:  Negative for back pain, joint pain and myalgias.  Skin: Negative.   Neurological:  Negative for dizziness.  Psychiatric/Behavioral:  The patient is not nervous/anxious.      Physical  Exam Constitutional:      General: She is not in acute distress.    Appearance: She is well-developed. She is not diaphoretic.  Neck:     Thyroid: No thyromegaly.  Cardiovascular:     Rate and Rhythm: Normal rate and regular rhythm.     Heart sounds: Normal heart sounds.  Pulmonary:     Effort: Pulmonary effort is normal.  No respiratory distress.     Breath sounds: Normal breath sounds.  Chest:     Comments:  Partial right mastectomy Abdominal:     General: Bowel sounds are normal. There is no distension.     Palpations: Abdomen is soft.     Tenderness: There is no abdominal tenderness.     Comments: Chole drain   Musculoskeletal:        General: Normal range of motion.     Cervical back: Neck supple.     Right lower leg: No edema.     Left lower leg: No edema.  Lymphadenopathy:     Cervical: No cervical adenopathy.  Skin:    General: Skin is warm and dry.  Neurological:     Mental Status: She is alert. Mental status is at baseline.  Psychiatric:        Mood and Affect: Mood normal.    ASSESSMENT/ PLAN:  TODAY;    Dementia without behavioral disturbance unspecified dementia type: is without change weight is 136 pounds and stable will continue aricept 10 mg daily   2. Chronic herpes: no recent outbreak; will continue valtrex 500 mg daily   3. Essential hypertension: is stable b/p 145/73  PREVIOUS   4. Chronic constipation: is stable will continue miralax daily as needed.   5. History of breast cancer: is status post right partial mastectomy   6. Aortic atherosclerosis (ct 08-26-19) will monitor   7. Calculus of gall bladder without acute cholecystitis and obstruction: has long term chole drain last changed 08-10-21    Ok Edwards NP [facility]  Contact [phone] Monday through Friday 8am- 5pm  After hours call [phone]

## 2021-10-11 ENCOUNTER — Other Ambulatory Visit: Payer: Self-pay | Admitting: Adult Health

## 2021-10-11 MED ORDER — ALPRAZOLAM 1 MG PO TABS: 1.0000 mg | ORAL_TABLET | Freq: Every day | ORAL | 0 refills | Status: DC | PRN

## 2021-10-12 ENCOUNTER — Other Ambulatory Visit (HOSPITAL_COMMUNITY): Payer: Medicare Other

## 2021-10-26 ENCOUNTER — Non-Acute Institutional Stay (SKILLED_NURSING_FACILITY): Payer: Medicare Other | Admitting: Adult Health

## 2021-10-26 ENCOUNTER — Encounter: Payer: Self-pay | Admitting: Adult Health

## 2021-10-26 DIAGNOSIS — I10 Essential (primary) hypertension: Secondary | ICD-10-CM

## 2021-10-26 DIAGNOSIS — B009 Herpesviral infection, unspecified: Secondary | ICD-10-CM

## 2021-10-26 DIAGNOSIS — Z1159 Encounter for screening for other viral diseases: Secondary | ICD-10-CM | POA: Diagnosis not present

## 2021-10-26 DIAGNOSIS — F01C0 Vascular dementia, severe, without behavioral disturbance, psychotic disturbance, mood disturbance, and anxiety: Secondary | ICD-10-CM

## 2021-10-26 NOTE — Progress Notes (Signed)
Location:  [place] Room Number: 141-D Place of Service:  SNF (31)   CODE STATUS: Full Code  No Known Allergies  Chief Complaint  Patient presents with   Medical Management of Chronic Issues                Dementia without behavioral disturbance unspecified dementia type: Chronic herpes: Essential hypertension:     HPI:  She is a 88 year old long term resident of this facility being seen for the management of her chronic illnesses: Dementia without behavioral disturbance unspecified dementia type: Chronic herpes: Essential hypertension:. There are no reports of uncontrolled pain. Her chole drain is presently out. There are no reports of anxiety or depressive thoughts.   Past Medical History:  Diagnosis Date   Breast cancer ([place])    Cognitive communication deficit    Dementia ([place])    Difficulty in walking(719.7)    Fall    HTN (hypertension) 08/26/2016   Muscle weakness (generalized)    Rash and nonspecific skin eruption    Thyroid disease    hypo   Urinary tract infection, site not specified    Vitamin B deficiency     Past Surgical History:  Procedure Laterality Date   COLON SURGERY     IR CATHETER TUBE CHANGE  05/07/2017   IR EXCHANGE BILIARY DRAIN  07/02/2017   IR EXCHANGE BILIARY DRAIN  07/10/2017   IR EXCHANGE BILIARY DRAIN  08/28/2017   IR EXCHANGE BILIARY DRAIN  10/11/2017   IR EXCHANGE BILIARY DRAIN  10/28/2017   IR EXCHANGE BILIARY DRAIN  12/23/2017   IR EXCHANGE BILIARY DRAIN  01/23/2018   IR EXCHANGE BILIARY DRAIN  02/17/2018   IR EXCHANGE BILIARY DRAIN  03/20/2018   IR EXCHANGE BILIARY DRAIN  04/17/2018   IR EXCHANGE BILIARY DRAIN  05/29/2018   IR EXCHANGE BILIARY DRAIN  07/24/2018   IR EXCHANGE BILIARY DRAIN  09/22/2018   IR EXCHANGE BILIARY DRAIN  11/20/2018   IR EXCHANGE BILIARY DRAIN  02/11/2019   IR EXCHANGE BILIARY DRAIN  04/14/2019   IR EXCHANGE BILIARY DRAIN  06/09/2019   IR EXCHANGE BILIARY DRAIN  11/26/2019   IR EXCHANGE BILIARY DRAIN   12/23/2019   IR EXCHANGE BILIARY DRAIN  02/22/2020   IR EXCHANGE BILIARY DRAIN  05/25/2020   IR EXCHANGE BILIARY DRAIN  07/29/2020   IR EXCHANGE BILIARY DRAIN  08/30/2020   IR EXCHANGE BILIARY DRAIN  10/06/2020   IR EXCHANGE BILIARY DRAIN  11/18/2020   IR EXCHANGE BILIARY DRAIN  12/21/2020   IR EXCHANGE BILIARY DRAIN  02/03/2021   IR EXCHANGE BILIARY DRAIN  03/07/2021   IR EXCHANGE BILIARY DRAIN  05/04/2021   IR EXCHANGE BILIARY DRAIN  06/22/2021   IR EXCHANGE BILIARY DRAIN  07/04/2021   IR EXCHANGE BILIARY DRAIN  08/10/2021   IR PERC CHOLECYSTOSTOMY  01/27/2017   IR PERC CHOLECYSTOSTOMY  11/18/2019   IR RADIOLOGIST EVAL & MGMT  03/05/2017   IR SINUS/FIST TUBE CHK-NON GI  10/06/2020   MASTECTOMY, PARTIAL Right     Social History   Socioeconomic History   Marital status: Widowed    Spouse name: Not on file   Number of children: Not on file   Years of education: Not on file   Highest education level: Not on file  Occupational History   Not on file  Tobacco Use   Smoking status: Never   Smokeless tobacco: Never  Vaping Use   Vaping Use: Never used  Substance  and Sexual Activity   Alcohol use: No   Drug use: No   Sexual activity: Not on file  Other Topics Concern   Not on file  Social History Narrative   Not on file   Social Determinants of Health   Financial Resource Strain: Not on file  Food Insecurity: Not on file  Transportation Needs: Not on file  Physical Activity: Not on file  Stress: Not on file  Social Connections: Not on file  Intimate Partner Violence: Not on file   Family History  Problem Relation Age of Onset   Cancer Father        ? primary   Diabetes Neg Hx    Heart disease Neg Hx    Stroke Neg Hx       VITAL SIGNS BP 120/68    Pulse 64    Temp 97.7 F (36.5 C)    Resp 20    Ht 5\' 3"  (1.6 m)    Wt 136 lb 9.6 oz (62 kg)    SpO2 93%    BMI 24.20 kg/m   Outpatient Encounter Medications as of 10/26/2021  Medication Sig   calcium carbonate (TUMS - DOSED IN MG  ELEMENTAL CALCIUM) 500 MG chewable tablet Chew 1 tablet by mouth daily.   Cholecalciferol 1000 units tablet Take 1,000 Units by mouth daily. hypocalcemia   Cyanocobalamin (VITAMIN B-12) 1000 MCG SUBL Place 1 tablet under the tongue at bedtime.   donepezil (ARICEPT) 10 MG tablet Take 1 tablet by mouth at bedtime.   LORazepam (ATIVAN) 1 MG tablet Take 1 mg by mouth daily as needed for anxiety.   NON FORMULARY Regular diet   Nutritional Supplements (ENSURE ENLIVE PO) Take 1.5 kcal by mouth daily.   Nutritional Supplements (NUTRITIONAL SUPPLEMENT PO) Take 1 each by mouth at bedtime. Magic Cup   polyethylene glycol (MIRALAX / GLYCOLAX) packet Take 17 g by mouth daily as needed for mild constipation or moderate constipation.    sodium chloride irrigation 0.9 % irrigation Irrigate with 5 mLs as directed daily. Special Instructions: flush cholecystostomy daily   valACYclovir (VALTREX) 500 MG tablet Take 500 mg by mouth daily. Special Instructions: HSV suppression   [DISCONTINUED] ALPRAZolam (XANAX) 1 MG tablet Take 1 tablet (1 mg total) by mouth daily as needed for anxiety. Give prior to chole drain change   No facility-administered encounter medications on file as of 10/26/2021.     SIGNIFICANT DIAGNOSTIC EXAMS   NO NEW EXAMS.   LABS REVIEWED: PREVIOUS:   01-23-21: wbc 5.0; hgb 12.3; hct 39.3; mcv 100.0 plt 236; glucose 89; bun 16; creat 0.86; k+ 4.6; na++ 141; ca 9.0 GFR>60; liver normal albumin 3.2 mag 2.0; tsh 3.260 07-27-21: wbc 5.1; hgb 12.0; hct 36.7; mcv 101.1 plt 281; glucose 86; bun 16; creat 0.74; k+ 3.9; na++ 136; ca 8.4 GFR>60; liver normal albumin 3.0   NO NEW LABS.   Review of Systems  Constitutional:  Negative for malaise/fatigue.  Respiratory:  Negative for cough and shortness of breath.   Cardiovascular:  Negative for chest pain, palpitations and leg swelling.  Gastrointestinal:  Negative for abdominal pain, constipation and heartburn.  Musculoskeletal:  Negative for back pain,  joint pain and myalgias.  Skin: Negative.   Neurological:  Negative for dizziness.  Psychiatric/Behavioral:  The patient is not nervous/anxious.       Physical Exam Constitutional:      General: She is not in acute distress.    Appearance: She is well-developed. She is not  diaphoretic.  Neck:     Thyroid: No thyromegaly.  Cardiovascular:     Rate and Rhythm: Normal rate and regular rhythm.     Pulses: Normal pulses.     Heart sounds: Normal heart sounds.  Pulmonary:     Effort: Pulmonary effort is normal. No respiratory distress.     Breath sounds: Normal breath sounds.  Chest:     Comments: Right partial mastectomy  Abdominal:     General: Bowel sounds are normal. There is no distension.     Palpations: Abdomen is soft.     Tenderness: There is no abdominal tenderness.     Comments: Chole drain out   Musculoskeletal:        General: Normal range of motion.     Cervical back: Neck supple.     Right lower leg: No edema.     Left lower leg: No edema.  Lymphadenopathy:     Cervical: No cervical adenopathy.  Skin:    General: Skin is warm and dry.  Neurological:     Mental Status: She is alert. Mental status is at baseline.  Psychiatric:        Mood and Affect: Mood normal.     ASSESSMENT/ PLAN:  TODAY;   Dementia without behavioral disturbance unspecified dementia type: is without change weight is 136 pounds and stable will continue aricept 10 mg daily   2. Chronic herpes: no recent outbreak; will continue valtrex 500 mg daily   3. Essential hypertension: is stable b/p 120/68: (ct 08-26-19); will monitor    PREVIOUS   4. Calculus of gall bladder without acute cholecystitis and obstruction:  chole drain last changed 08-10-21; is presently out   5. Dementia without behavioral disturbance unspecified dementia type: is without change weight is 136 pounds and stable will continue aricept 10 mg daily   6. Chronic herpes: no recent outbreak; will continue valtrex 500 mg  daily   7. Essential hypertension: is stable b/p 120/68       Ok Edwards NP [facility]   call [phone]

## 2021-11-02 DIAGNOSIS — Z1159 Encounter for screening for other viral diseases: Secondary | ICD-10-CM | POA: Diagnosis not present

## 2021-11-09 DIAGNOSIS — Z1159 Encounter for screening for other viral diseases: Secondary | ICD-10-CM | POA: Diagnosis not present

## 2021-11-10 ENCOUNTER — Non-Acute Institutional Stay (SKILLED_NURSING_FACILITY): Payer: Medicare Other

## 2021-11-10 DIAGNOSIS — I10 Essential (primary) hypertension: Secondary | ICD-10-CM | POA: Diagnosis not present

## 2021-11-10 DIAGNOSIS — F01C0 Vascular dementia, severe, without behavioral disturbance, psychotic disturbance, mood disturbance, and anxiety: Secondary | ICD-10-CM

## 2021-11-10 NOTE — Progress Notes (Signed)
Location:  [place] Room Number: 141-D Place of Service:  SNF (31)   CODE STATUS: Full Code  No Known Allergies  Chief Complaint  Patient presents with   Acute Visit    Care plan meeting    HPI:  We have come together for her care plan meeting. BIMS 5/15 mood 0/30. She is limited assist with dressing toileting and hygiene; independent to supervision for other adls. She has occasional bladder incontinence frequently incontinent of bowel. Dietary: weight is 136 pounds; regular diet appetite is variable she has lost 3 pounds over the past 3 months. Therapy:none at this time. She continues to be followed for her chronic illnesses including:  Aortic atherosclerosis Primary hypertension  Severe vascular dementia without behavioral disturbance; psychotic disturbance mood disturbance; anxiety  Past Medical History:  Diagnosis Date   Breast cancer ([place])    Cognitive communication deficit    Dementia ([place])    Difficulty in walking(719.7)    Fall    HTN (hypertension) 08/26/2016   Muscle weakness (generalized)    Rash and nonspecific skin eruption    Thyroid disease    hypo   Urinary tract infection, site not specified    Vitamin B deficiency     Past Surgical History:  Procedure Laterality Date   COLON SURGERY     IR CATHETER TUBE CHANGE  05/07/2017   IR EXCHANGE BILIARY DRAIN  07/02/2017   IR EXCHANGE BILIARY DRAIN  07/10/2017   IR EXCHANGE BILIARY DRAIN  08/28/2017   IR EXCHANGE BILIARY DRAIN  10/11/2017   IR EXCHANGE BILIARY DRAIN  10/28/2017   IR EXCHANGE BILIARY DRAIN  12/23/2017   IR EXCHANGE BILIARY DRAIN  01/23/2018   IR EXCHANGE BILIARY DRAIN  02/17/2018   IR EXCHANGE BILIARY DRAIN  03/20/2018   IR EXCHANGE BILIARY DRAIN  04/17/2018   IR EXCHANGE BILIARY DRAIN  05/29/2018   IR EXCHANGE BILIARY DRAIN  07/24/2018   IR EXCHANGE BILIARY DRAIN  09/22/2018   IR EXCHANGE BILIARY DRAIN  11/20/2018   IR EXCHANGE BILIARY DRAIN  02/11/2019   IR EXCHANGE BILIARY DRAIN   04/14/2019   IR EXCHANGE BILIARY DRAIN  06/09/2019   IR EXCHANGE BILIARY DRAIN  11/26/2019   IR EXCHANGE BILIARY DRAIN  12/23/2019   IR EXCHANGE BILIARY DRAIN  02/22/2020   IR EXCHANGE BILIARY DRAIN  05/25/2020   IR EXCHANGE BILIARY DRAIN  07/29/2020   IR EXCHANGE BILIARY DRAIN  08/30/2020   IR EXCHANGE BILIARY DRAIN  10/06/2020   IR EXCHANGE BILIARY DRAIN  11/18/2020   IR EXCHANGE BILIARY DRAIN  12/21/2020   IR EXCHANGE BILIARY DRAIN  02/03/2021   IR EXCHANGE BILIARY DRAIN  03/07/2021   IR EXCHANGE BILIARY DRAIN  05/04/2021   IR EXCHANGE BILIARY DRAIN  06/22/2021   IR EXCHANGE BILIARY DRAIN  07/04/2021   IR EXCHANGE BILIARY DRAIN  08/10/2021   IR PERC CHOLECYSTOSTOMY  01/27/2017   IR PERC CHOLECYSTOSTOMY  11/18/2019   IR RADIOLOGIST EVAL & MGMT  03/05/2017   IR SINUS/FIST TUBE CHK-NON GI  10/06/2020   MASTECTOMY, PARTIAL Right     Social History   Socioeconomic History   Marital status: Widowed    Spouse name: Not on file   Number of children: Not on file   Years of education: Not on file   Highest education level: Not on file  Occupational History   Not on file  Tobacco Use   Smoking status: Never   Smokeless tobacco: Never  Vaping  Use   Vaping Use: Never used  Substance and Sexual Activity   Alcohol use: No   Drug use: No   Sexual activity: Not on file  Other Topics Concern   Not on file  Social History Narrative   Not on file   Social Determinants of Health   Financial Resource Strain: Not on file  Food Insecurity: Not on file  Transportation Needs: Not on file  Physical Activity: Not on file  Stress: Not on file  Social Connections: Not on file  Intimate Partner Violence: Not on file   Family History  Problem Relation Age of Onset   Cancer Father        ? primary   Diabetes Neg Hx    Heart disease Neg Hx    Stroke Neg Hx       VITAL SIGNS BP (!) 126/58    Pulse 80    Temp 97.7 F (36.5 C)    Resp 20    Ht 5\' 3"  (1.6 m)    Wt 136 lb 9.6 oz (62 kg)    SpO2 93%    BMI  24.20 kg/m   Outpatient Encounter Medications as of 11/10/2021  Medication Sig   calcium carbonate (TUMS - DOSED IN MG ELEMENTAL CALCIUM) 500 MG chewable tablet Chew 1 tablet by mouth daily.   Cholecalciferol 1000 units tablet Take 1,000 Units by mouth daily. hypocalcemia   Cyanocobalamin (VITAMIN B-12) 1000 MCG SUBL Place 1 tablet under the tongue at bedtime.   donepezil (ARICEPT) 10 MG tablet Take 1 tablet by mouth at bedtime.   LORazepam (ATIVAN) 1 MG tablet Take 1 mg by mouth daily as needed for anxiety.   NON FORMULARY Regular diet   Nutritional Supplements (ENSURE ENLIVE PO) Take 1.5 kcal by mouth daily.   Nutritional Supplements (NUTRITIONAL SUPPLEMENT PO) Take 1 each by mouth at bedtime. Magic Cup   polyethylene glycol (MIRALAX / GLYCOLAX) packet Take 17 g by mouth daily as needed for mild constipation or moderate constipation.    valACYclovir (VALTREX) 500 MG tablet Take 500 mg by mouth daily. Special Instructions: HSV suppression   [DISCONTINUED] sodium chloride irrigation 0.9 % irrigation Irrigate with 5 mLs as directed daily. Special Instructions: flush cholecystostomy daily   No facility-administered encounter medications on file as of 11/10/2021.     SIGNIFICANT DIAGNOSTIC EXAMS   NO NEW EXAMS.   LABS REVIEWED: PREVIOUS:   01-23-21: wbc 5.0; hgb 12.3; hct 39.3; mcv 100.0 plt 236; glucose 89; bun 16; creat 0.86; k+ 4.6; na++ 141; ca 9.0 GFR>60; liver normal albumin 3.2 mag 2.0; tsh 3.260 07-27-21: wbc 5.1; hgb 12.0; hct 36.7; mcv 101.1 plt 281; glucose 86; bun 16; creat 0.74; k+ 3.9; na++ 136; ca 8.4 GFR>60; liver normal albumin 3.0   NO NEW LABS.   Review of Systems  Constitutional:  Negative for malaise/fatigue.  Respiratory:  Negative for cough and shortness of breath.   Cardiovascular:  Negative for chest pain, palpitations and leg swelling.  Gastrointestinal:  Negative for abdominal pain, constipation and heartburn.  Musculoskeletal:  Negative for back pain, joint  pain and myalgias.  Skin: Negative.   Neurological:  Negative for dizziness.  Psychiatric/Behavioral:  The patient is not nervous/anxious.    Physical Exam Constitutional:      General: She is not in acute distress.    Appearance: She is well-developed. She is not diaphoretic.  Neck:     Thyroid: No thyromegaly.  Cardiovascular:     Rate and Rhythm:  Normal rate and regular rhythm.     Heart sounds: Normal heart sounds.  Pulmonary:     Effort: Pulmonary effort is normal. No respiratory distress.     Breath sounds: Normal breath sounds.  Chest:     Comments: Right partial mastectomy  Abdominal:  Abdominal:     General: Bowel sounds are normal. There is no distension.     Palpations: Abdomen is soft.     Tenderness: There is no abdominal tenderness.     Comments: Chole drain out    Musculoskeletal:        General: Normal range of motion.     Cervical back: Neck supple.     Right lower leg: No edema.     Left lower leg: No edema.  Lymphadenopathy:     Cervical: No cervical adenopathy.  Skin:    General: Skin is warm and dry.  Neurological:     Mental Status: She is alert. Mental status is at baseline.  Psychiatric:        Mood and Affect: Mood normal.     ASSESSMENT/ PLAN:  TODAY  Aortic atherosclerosis Primary hypertension Severe vascular dementia without behavioral disturbance; psychotic disturbance mood disturbance; anxiety  Will continue current medications  will continue current plan of care  Will continue to monitor her status.   Time spent with patient 40 minutes: plan of care ;medications.     Ok Edwards NP [facility]   call [phone]

## 2021-11-16 ENCOUNTER — Non-Acute Institutional Stay (SKILLED_NURSING_FACILITY): Payer: Medicare Other | Admitting: Adult Health

## 2021-11-16 ENCOUNTER — Encounter: Payer: Self-pay | Admitting: Adult Health

## 2021-11-16 DIAGNOSIS — E538 Deficiency of other specified B group vitamins: Secondary | ICD-10-CM | POA: Diagnosis not present

## 2021-11-16 DIAGNOSIS — F01C0 Vascular dementia, severe, without behavioral disturbance, psychotic disturbance, mood disturbance, and anxiety: Secondary | ICD-10-CM

## 2021-11-16 DIAGNOSIS — Z1159 Encounter for screening for other viral diseases: Secondary | ICD-10-CM | POA: Diagnosis not present

## 2021-11-16 DIAGNOSIS — I700 Atherosclerosis of aorta: Secondary | ICD-10-CM | POA: Diagnosis not present

## 2021-11-16 NOTE — Progress Notes (Signed)
Location:  [place] Room Number: 141 Place of Service:  SNF (31)   CODE STATUS: full code   No Known Allergies  Chief Complaint  Patient presents with   Acute Visit    Advanced directives care plan meeting.     HPI:  We have come together with her daughter to discuss her advanced directives. At this time she is a full code. We spoke about her code status what is involved with cpr; the expected outcomes. She has decided to make her a dnr. She does understand that she will continue to be treated for any acute episodes that occur. She would like for Kamarie to have abt and ivf as indicated. She does not want a feeding tube inserted. She does understand the pros and cons of artificial feedings. She continues to be followed for her chronic illnesses including: Severe vascular dementia without behavioral disturbance psychotic disturbance mood disturbance or anxiety  Aortic atherosclerosis  Vitamin B 12 deficiency   Past Medical History:  Diagnosis Date   Aortic atherosclerosis ([place]) 11/15/2020   Breast cancer ([place])    Cognitive communication deficit    Dementia ([place])    Difficulty in walking(719.7)    Fall    HTN (hypertension) 08/26/2016   Muscle weakness (generalized)    Rash and nonspecific skin eruption    Thyroid disease    hypo   Urinary tract infection, site not specified    Vitamin B deficiency     Past Surgical History:  Procedure Laterality Date   COLON SURGERY     IR CATHETER TUBE CHANGE  05/07/2017   IR EXCHANGE BILIARY DRAIN  07/02/2017   IR EXCHANGE BILIARY DRAIN  07/10/2017   IR EXCHANGE BILIARY DRAIN  08/28/2017   IR EXCHANGE BILIARY DRAIN  10/11/2017   IR EXCHANGE BILIARY DRAIN  10/28/2017   IR EXCHANGE BILIARY DRAIN  12/23/2017   IR EXCHANGE BILIARY DRAIN  01/23/2018   IR EXCHANGE BILIARY DRAIN  02/17/2018   IR EXCHANGE BILIARY DRAIN  03/20/2018   IR EXCHANGE BILIARY DRAIN  04/17/2018   IR EXCHANGE BILIARY DRAIN  05/29/2018   IR EXCHANGE  BILIARY DRAIN  07/24/2018   IR EXCHANGE BILIARY DRAIN  09/22/2018   IR EXCHANGE BILIARY DRAIN  11/20/2018   IR EXCHANGE BILIARY DRAIN  02/11/2019   IR EXCHANGE BILIARY DRAIN  04/14/2019   IR EXCHANGE BILIARY DRAIN  06/09/2019   IR EXCHANGE BILIARY DRAIN  11/26/2019   IR EXCHANGE BILIARY DRAIN  12/23/2019   IR EXCHANGE BILIARY DRAIN  02/22/2020   IR EXCHANGE BILIARY DRAIN  05/25/2020   IR EXCHANGE BILIARY DRAIN  07/29/2020   IR EXCHANGE BILIARY DRAIN  08/30/2020   IR EXCHANGE BILIARY DRAIN  10/06/2020   IR EXCHANGE BILIARY DRAIN  11/18/2020   IR EXCHANGE BILIARY DRAIN  12/21/2020   IR EXCHANGE BILIARY DRAIN  02/03/2021   IR EXCHANGE BILIARY DRAIN  03/07/2021   IR EXCHANGE BILIARY DRAIN  05/04/2021   IR EXCHANGE BILIARY DRAIN  06/22/2021   IR EXCHANGE BILIARY DRAIN  07/04/2021   IR EXCHANGE BILIARY DRAIN  08/10/2021   IR PERC CHOLECYSTOSTOMY  01/27/2017   IR PERC CHOLECYSTOSTOMY  11/18/2019   IR RADIOLOGIST EVAL & MGMT  03/05/2017   IR SINUS/FIST TUBE CHK-NON GI  10/06/2020   MASTECTOMY, PARTIAL Right     Social History   Socioeconomic History   Marital status: Widowed    Spouse name: Not on file   Number of children:  Not on file   Years of education: Not on file   Highest education level: Not on file  Occupational History   Not on file  Tobacco Use   Smoking status: Never   Smokeless tobacco: Never  Vaping Use   Vaping Use: Never used  Substance and Sexual Activity   Alcohol use: No   Drug use: No   Sexual activity: Not on file  Other Topics Concern   Not on file  Social History Narrative   Not on file   Social Determinants of Health   Financial Resource Strain: Not on file  Food Insecurity: Not on file  Transportation Needs: Not on file  Physical Activity: Not on file  Stress: Not on file  Social Connections: Not on file  Intimate Partner Violence: Not on file   Family History  Problem Relation Age of Onset   Cancer Father        ? primary   Diabetes Neg Hx    Heart disease Neg  Hx    Stroke Neg Hx       VITAL SIGNS BP 140/78    Pulse 82    Temp 97.7 F (36.5 C)    Resp 18    Ht 5\' 3"  (1.6 m)    Wt 136 lb 9.6 oz (62 kg)    BMI 24.20 kg/m   Outpatient Encounter Medications as of 11/16/2021  Medication Sig   calcium carbonate (TUMS - DOSED IN MG ELEMENTAL CALCIUM) 500 MG chewable tablet Chew 1 tablet by mouth daily.   Cholecalciferol 1000 units tablet Take 1,000 Units by mouth daily. hypocalcemia   Cyanocobalamin (VITAMIN B-12) 1000 MCG SUBL Place 1 tablet under the tongue at bedtime.   donepezil (ARICEPT) 10 MG tablet Take 1 tablet by mouth at bedtime.   LORazepam (ATIVAN) 1 MG tablet Take 1 mg by mouth daily as needed for anxiety.   NON FORMULARY Regular diet   Nutritional Supplements (ENSURE ENLIVE PO) Take 1.5 kcal by mouth daily.   Nutritional Supplements (NUTRITIONAL SUPPLEMENT PO) Take 1 each by mouth at bedtime. Magic Cup   polyethylene glycol (MIRALAX / GLYCOLAX) packet Take 17 g by mouth daily as needed for mild constipation or moderate constipation.    valACYclovir (VALTREX) 500 MG tablet Take 500 mg by mouth daily. Special Instructions: HSV suppression   No facility-administered encounter medications on file as of 11/16/2021.     SIGNIFICANT DIAGNOSTIC EXAMS  NO NEW EXAMS.   LABS REVIEWED: PREVIOUS:   01-23-21: wbc 5.0; hgb 12.3; hct 39.3; mcv 100.0 plt 236; glucose 89; bun 16; creat 0.86; k+ 4.6; na++ 141; ca 9.0 GFR>60; liver normal albumin 3.2 mag 2.0; tsh 3.260 07-27-21: wbc 5.1; hgb 12.0; hct 36.7; mcv 101.1 plt 281; glucose 86; bun 16; creat 0.74; k+ 3.9; na++ 136; ca 8.4 GFR>60; liver normal albumin 3.0   NO NEW LABS.   Review of Systems  Constitutional:  Negative for malaise/fatigue.  Respiratory:  Negative for cough and shortness of breath.   Cardiovascular:  Negative for chest pain, palpitations and leg swelling.  Gastrointestinal:  Negative for abdominal pain, constipation and heartburn.  Musculoskeletal:  Negative for back pain,  joint pain and myalgias.  Skin: Negative.   Neurological:  Negative for dizziness.  Psychiatric/Behavioral:  The patient is not nervous/anxious.    Physical Exam Constitutional:      General: She is not in acute distress.    Appearance: She is well-developed. She is not diaphoretic.  Neck:  Thyroid: No thyromegaly.  Cardiovascular:     Rate and Rhythm: Normal rate and regular rhythm.     Heart sounds: Normal heart sounds.  Pulmonary:     Effort: Pulmonary effort is normal. No respiratory distress.     Breath sounds: Normal breath sounds.  Chest:     Comments: Right partial mastectomy  Abdominal:  Abdominal:     General: Bowel sounds are normal. There is no distension.     Palpations: Abdomen is soft.     Tenderness: There is no abdominal tenderness.     Comments: Chole drain out     Musculoskeletal:        General: Normal range of motion.     Cervical back: Neck supple.     Right lower leg: No edema.     Left lower leg: No edema.  Lymphadenopathy:     Cervical: No cervical adenopathy.  Skin:    General: Skin is warm and dry.  Neurological:     Mental Status: She is alert. Mental status is at baseline.  Psychiatric:        Mood and Affect: Mood normal.      ASSESSMENT/ PLAN:  TODAY  Severe vascular dementia without behavioral disturbance psychotic disturbance mood disturbance or anxiety Aortic atherosclerosis Vitamin B 12 deficiency   MOST form filled out: DNR; limited hospitalization; no tube feeding; ok for abt and ivf.   Time spent with patient: 45 minutes: ( 20 minutes spent with advanced directives)    Ok Edwards NP [name]   call [phone]

## 2021-11-23 DIAGNOSIS — Z1159 Encounter for screening for other viral diseases: Secondary | ICD-10-CM | POA: Diagnosis not present

## 2021-11-29 ENCOUNTER — Non-Acute Institutional Stay (SKILLED_NURSING_FACILITY): Payer: Medicare Other | Admitting: Internal Medicine

## 2021-11-29 ENCOUNTER — Encounter: Payer: Self-pay | Admitting: Internal Medicine

## 2021-11-29 DIAGNOSIS — Z434 Encounter for attention to other artificial openings of digestive tract: Secondary | ICD-10-CM

## 2021-11-29 DIAGNOSIS — I700 Atherosclerosis of aorta: Secondary | ICD-10-CM

## 2021-11-29 DIAGNOSIS — I10 Essential (primary) hypertension: Secondary | ICD-10-CM | POA: Diagnosis not present

## 2021-11-29 DIAGNOSIS — F01C0 Vascular dementia, severe, without behavioral disturbance, psychotic disturbance, mood disturbance, and anxiety: Secondary | ICD-10-CM | POA: Diagnosis not present

## 2021-11-29 NOTE — Progress Notes (Addendum)
NURSING HOME LOCATION:  [facility] ROOM NUMBER:  [id number] D  CODE STATUS:  DNR  PCP:  Ok Edwards NP,PSC  This is a nursing facility follow up visit of chronic medical diagnoses & to document compliance with Regulation 483.30 (c) in The [place] [employer] Manual Phase 2 which mandates caregiver visit ( visits can alternate among physician, PA or NP as per statutes) within 10 days of 30 days / 60 days/ 90 days post admission to SNF date    Interim medical record and care since last SNF visit was updated with review of diagnostic studies and change in clinical status since last visit were documented.  HPI: She is a permanent resident of facility with history of breast cancer, aortic atherosclerosis, essential hypertension, hypothyroidism, vitamin B deficiency, and dementia. Surgeries and procedures include partial right mastectomy.  She has had multiple, multiple biliary drain exchange procedures.  The biliary drain tube is presently out.  Most recent labs were completed 07/27/2021.  Hypocalcemia is present with a value of 8.4.  Albumin was 3.0 and total protein 6.7.  AST was reduced to 14; alk phos, total bilirubin and ALT were normal.  No anemia was noted but macrocytosis was present with a value of 101.1.  Review of systems: Dementia invalidated responses.  When I entered the room she was finishing her lunch, ingesting her ice cream dessert.  I introduced myself and told her that I was there to comply with Medicare regulations necessitating routine visits at least every 3 months.  At that point she stated "I saw you and you saw me".  When I asked her if she were having any medical problems her response was "you won't let me finish my ice cream".  At that point I told her that I would come back for the interview and exam after her meal.  Upon my return, the responses to all review of systems queries was a rapidly delivered "no".  During the interview she asked me my name 3  additional times.    Constitutional: No fever, significant weight change, fatigue  Eyes: No redness, discharge, pain, vision change ENT/mouth: No nasal congestion,  purulent discharge, earache, change in hearing, sore throat  Cardiovascular: No chest pain, palpitations, paroxysmal nocturnal dyspnea, claudication, edema  Respiratory: No cough, sputum production, hemoptysis, DOE, significant snoring, apnea   Gastrointestinal: No heartburn, dysphagia, abdominal pain, nausea /vomiting, rectal bleeding, melena, change in bowels Genitourinary: No dysuria, hematuria, pyuria, incontinence, nocturia Musculoskeletal: No joint stiffness, joint swelling, weakness, pain Dermatologic: No rash, pruritus, change in appearance of skin Neurologic: No dizziness, headache, syncope, seizures, numbness, tingling Psychiatric: No significant anxiety, depression, insomnia, anorexia Endocrine: No change in hair/skin/nails, excessive thirst, excessive hunger, excessive urination  Hematologic/lymphatic: No significant bruising, lymphadenopathy, abnormal bleeding Allergy/immunology: No itchy/watery eyes, significant sneezing, urticaria, angioedema  Physical exam:  Pertinent or positive findings: She appears her age and somewhat chronically suboptimally nourished.  Dental malalignment is present.  Heart sounds are distant.  Minor low-grade rales are present bilaterally.  When I gently palpated her abdomen she yelled out as if in pain and stated "now I can pinch you".  Pedal pulses are decreased.  Posterior tibial pulses are stronger than the dorsalis pedis pulses.  She has trace edema at the sock line.  General appearance: no acute distress, increased work of breathing is present.   Lymphatic: No lymphadenopathy about the head, neck, axilla. Eyes: No conjunctival inflammation or lid edema is present. There is no scleral  icterus. Ears:  External ear exam shows no significant lesions or deformities.   Nose:  External nasal  examination shows no deformity or inflammation. Nasal mucosa are pink and moist without lesions, exudates Oral exam:  Lips and gums are healthy appearing. There is no oropharyngeal erythema or exudate. Neck:  No thyromegaly, masses, tenderness noted.    Heart:  No gallop, murmur, click, rub .  Lungs:  without wheezes, rhonchi, rales, rubs. Abdomen: Bowel sounds are normal. Abdomen is soft with no organomegaly, hernias, masses. GU: Deferred  Extremities:  No cyanosis, clubbing, edema  Neurologic exam :balance, Rhomberg, finger to nose testing could not be completed due to clinical state Skin: Warm & dry w/o tenting. No significant lesions or rash.  See summary under each active problem in the Problem List with associated updated therapeutic plan

## 2021-11-30 ENCOUNTER — Encounter: Payer: Self-pay | Admitting: Internal Medicine

## 2021-11-30 DIAGNOSIS — Z03818 Encounter for observation for suspected exposure to other biological agents ruled out: Secondary | ICD-10-CM | POA: Diagnosis not present

## 2021-11-30 NOTE — Assessment & Plan Note (Signed)
During the interview and exam she asked my name 4 times.  Her responses exhibited sarcasm as noted.  This is most likely a defense ploy because of her dementia.  No significant other behavioral issues reported by staff

## 2021-11-30 NOTE — Assessment & Plan Note (Signed)
Presently tube is out.  She states seem to indicate some tenderness to palpation in the right upper quadrant.  LFT update can be pursued.

## 2021-11-30 NOTE — Assessment & Plan Note (Signed)
Patient denies any active cardiopulmonary symptoms.  Validity of responses is questionable in view of advanced dementia.

## 2021-11-30 NOTE — Assessment & Plan Note (Signed)
Blood pressure control remains good without antihypertensive medications.  No change indicated.

## 2021-11-30 NOTE — Patient Instructions (Signed)
See assessment and plan under each diagnosis in the problem list and acutely for this visit 

## 2021-12-07 DIAGNOSIS — Z1159 Encounter for screening for other viral diseases: Secondary | ICD-10-CM | POA: Diagnosis not present

## 2021-12-13 ENCOUNTER — Inpatient Hospital Stay (HOSPITAL_COMMUNITY)
Admission: EM | Admit: 2021-12-13 | Discharge: 2021-12-16 | DRG: 392 | Disposition: A | Discharge status: Skilled Nursing Facility | Payer: Medicare Other | Source: Skilled Nursing Facility | Attending: Internal Medicine | Admitting: Internal Medicine

## 2021-12-13 ENCOUNTER — Emergency Department (HOSPITAL_COMMUNITY): Payer: Medicare Other

## 2021-12-13 DIAGNOSIS — Z853 Personal history of malignant neoplasm of breast: Secondary | ICD-10-CM | POA: Diagnosis not present

## 2021-12-13 DIAGNOSIS — K5792 Diverticulitis of intestine, part unspecified, without perforation or abscess without bleeding: Secondary | ICD-10-CM

## 2021-12-13 DIAGNOSIS — Z66 Do not resuscitate: Secondary | ICD-10-CM | POA: Diagnosis not present

## 2021-12-13 DIAGNOSIS — Z20822 Contact with and (suspected) exposure to covid-19: Secondary | ICD-10-CM | POA: Diagnosis present

## 2021-12-13 DIAGNOSIS — K5732 Diverticulitis of large intestine without perforation or abscess without bleeding: Secondary | ICD-10-CM | POA: Diagnosis not present

## 2021-12-13 DIAGNOSIS — F03918 Unspecified dementia, unspecified severity, with other behavioral disturbance: Secondary | ICD-10-CM

## 2021-12-13 DIAGNOSIS — F0390 Unspecified dementia without behavioral disturbance: Secondary | ICD-10-CM | POA: Diagnosis not present

## 2021-12-13 DIAGNOSIS — Z79899 Other long term (current) drug therapy: Secondary | ICD-10-CM

## 2021-12-13 DIAGNOSIS — Z9011 Acquired absence of right breast and nipple: Secondary | ICD-10-CM

## 2021-12-13 DIAGNOSIS — I10 Essential (primary) hypertension: Secondary | ICD-10-CM | POA: Diagnosis not present

## 2021-12-13 DIAGNOSIS — N119 Chronic tubulo-interstitial nephritis, unspecified: Secondary | ICD-10-CM | POA: Diagnosis not present

## 2021-12-13 DIAGNOSIS — I700 Atherosclerosis of aorta: Secondary | ICD-10-CM | POA: Diagnosis not present

## 2021-12-13 DIAGNOSIS — E539 Vitamin B deficiency, unspecified: Secondary | ICD-10-CM | POA: Diagnosis not present

## 2021-12-13 DIAGNOSIS — Z434 Encounter for attention to other artificial openings of digestive tract: Secondary | ICD-10-CM

## 2021-12-13 DIAGNOSIS — K812 Acute cholecystitis with chronic cholecystitis: Secondary | ICD-10-CM | POA: Diagnosis present

## 2021-12-13 DIAGNOSIS — F01C0 Vascular dementia, severe, without behavioral disturbance, psychotic disturbance, mood disturbance, and anxiety: Secondary | ICD-10-CM | POA: Diagnosis not present

## 2021-12-13 DIAGNOSIS — Z87440 Personal history of urinary (tract) infections: Secondary | ICD-10-CM

## 2021-12-13 DIAGNOSIS — R109 Unspecified abdominal pain: Secondary | ICD-10-CM | POA: Diagnosis not present

## 2021-12-13 LAB — CBC WITH DIFFERENTIAL/PLATELET
Abs Immature Granulocytes: 0.12 10*3/uL — ABNORMAL HIGH (ref 0.00–0.07)
Basophils Absolute: 0.1 10*3/uL (ref 0.0–0.1)
Basophils Relative: 0 %
Eosinophils Absolute: 0 10*3/uL (ref 0.0–0.5)
Eosinophils Relative: 0 %
HCT: 36.6 % (ref 36.0–46.0)
Hemoglobin: 11.8 g/dL — ABNORMAL LOW (ref 12.0–15.0)
Immature Granulocytes: 1 %
Lymphocytes Relative: 6 %
Lymphs Abs: 1.1 10*3/uL (ref 0.7–4.0)
MCH: 31.7 pg (ref 26.0–34.0)
MCHC: 32.2 g/dL (ref 30.0–36.0)
MCV: 98.4 fL (ref 80.0–100.0)
Monocytes Absolute: 1.2 10*3/uL — ABNORMAL HIGH (ref 0.1–1.0)
Monocytes Relative: 7 %
Neutro Abs: 14.9 10*3/uL — ABNORMAL HIGH (ref 1.7–7.7)
Neutrophils Relative %: 86 %
Platelets: 361 10*3/uL (ref 150–400)
RBC: 3.72 MIL/uL — ABNORMAL LOW (ref 3.87–5.11)
RDW: 16.3 % — ABNORMAL HIGH (ref 11.5–15.5)
WBC: 17.4 10*3/uL — ABNORMAL HIGH (ref 4.0–10.5)
nRBC: 0 % (ref 0.0–0.2)

## 2021-12-13 LAB — URINALYSIS, ROUTINE W REFLEX MICROSCOPIC
Bacteria, UA: NONE SEEN
Bilirubin Urine: NEGATIVE
Glucose, UA: NEGATIVE mg/dL
Ketones, ur: 5 mg/dL — AB
Leukocytes,Ua: NEGATIVE
Nitrite: NEGATIVE
Protein, ur: NEGATIVE mg/dL
Specific Gravity, Urine: >1.046 — ABNORMAL HIGH (ref 1.005–1.030)
pH: 5 (ref 5.0–8.0)

## 2021-12-13 LAB — COMPREHENSIVE METABOLIC PANEL
ALT: 14 U/L (ref 0–44)
AST: 12 U/L — ABNORMAL LOW (ref 15–41)
Albumin: 3.1 g/dL — ABNORMAL LOW (ref 3.5–5.0)
Alkaline Phosphatase: 72 U/L (ref 38–126)
Anion gap: 8 (ref 5–15)
BUN: 17 mg/dL (ref 8–23)
CO2: 27 mmol/L (ref 22–32)
Calcium: 8.7 mg/dL — ABNORMAL LOW (ref 8.9–10.3)
Chloride: 102 mmol/L (ref 98–111)
Creatinine, Ser: 0.99 mg/dL (ref 0.44–1.00)
GFR, Estimated: 53 mL/min — ABNORMAL LOW (ref >60)
Glucose, Bld: 132 mg/dL — ABNORMAL HIGH (ref 70–99)
Potassium: 4.2 mmol/L (ref 3.5–5.1)
Sodium: 137 mmol/L (ref 135–145)
Total Bilirubin: 0.5 mg/dL (ref 0.3–1.2)
Total Protein: 7.8 g/dL (ref 6.5–8.1)

## 2021-12-13 LAB — RESP PANEL BY RT-PCR (FLU A&B, COVID) ARPGX2
Influenza A by PCR: NEGATIVE
Influenza B by PCR: NEGATIVE
SARS Coronavirus 2 by RT PCR: NEGATIVE

## 2021-12-13 LAB — LIPASE, BLOOD: Lipase: 36 U/L (ref 11–51)

## 2021-12-13 MED ORDER — PIPERACILLIN-TAZOBACTAM 3.375 G IVPB
3.3750 g | Freq: Three times a day (TID) | INTRAVENOUS | Status: DC
  Administered 2021-12-14 – 2021-12-15 (×5): 3.375 g via INTRAVENOUS
  Filled 2021-12-13 (×5): qty 50

## 2021-12-13 MED ORDER — MORPHINE SULFATE (PF) 2 MG/ML IV SOLN: 2.0000 mg | INTRAVENOUS | Status: DC | PRN

## 2021-12-13 MED ORDER — LORAZEPAM 2 MG/ML IJ SOLN: 0.5000 mg | Freq: Once | INTRAMUSCULAR | Status: DC | PRN

## 2021-12-13 MED ORDER — IOHEXOL 300 MG/ML  SOLN
100.0000 mL | Freq: Once | INTRAMUSCULAR | Status: AC | PRN
  Administered 2021-12-13: 75 mL via INTRAVENOUS

## 2021-12-13 MED ORDER — PIPERACILLIN-TAZOBACTAM 3.375 G IVPB 30 MIN
3.3750 g | Freq: Once | INTRAVENOUS | Status: AC
  Administered 2021-12-13: 3.375 g via INTRAVENOUS
  Filled 2021-12-13: qty 50

## 2021-12-13 NOTE — ED Notes (Signed)
Patient transported to CT 

## 2021-12-13 NOTE — Assessment & Plan Note (Addendum)
Presents with abdominal pain and nausea.  Showing acute diverticulitis, no loculated pericolic abscess.  Other findings of chronic cholecystitis for which patient has required percutaneous tubes in the recent past she was not a candidate for surgery.  UA not suggestive of UTI.  Leukocytosis of 17.4, Tmax of 100, rules out for sepsis. -Bowel rest with clear liquid diet>>advanced to soft diet which patient tolerated -IV morphine 2 mg as needed>>has not required any -Continue IV Zosyn>>d/c with amox/clav x 7 more days - d5 N/s 75cc/hr x 1 day>>saline locked

## 2021-12-13 NOTE — H&P (Signed)
History and Physical    TREZURE CRONK MVH:[id number] DOB: 1924/12/26 DOA: 12/13/2021  PCP: [name], NP  Patient coming from: [place] [facility]  I have personally briefly reviewed patient's old medical records in [facility]  Chief Complaint:   HPI: Cynthia [name] is a 88 y.o. female with medical history significant for dementia, hypertension, chronic cholecystitis.  Patient was brought to the ED from [facility] with reports of abdominal pain, guarding, nausea.  At time of my evaluation, patient is awake alert to person, thinks she is at some nursing facility, but she is unwilling to communicate on why she was brought to the hospital or give me further details about her symptoms.  ED Course: Tmax 100.  Heart rate 88-93.  Respirate rate 18.  Blood pressure 1 35-140.  O2 sats > 92% on room air.  Leukocytosis 17.4.  Abdominal CT pelvis with contrast shows acute diverticulitis.  Chronic cholecystitis.  Chronic pyelonephritis. IV Zosyn was ordered.  Which was obtained.  Hospitalist to admit.  Review of Systems: Unable to assess, patient is refusing to respond to questions.  Past Medical History:  Diagnosis Date   Aortic atherosclerosis ([place]) 11/15/2020   Breast cancer ([place])    Cognitive communication deficit    Dementia ([place])    Difficulty in walking(719.7)    Fall    HTN (hypertension) 08/26/2016   Muscle weakness (generalized)    Rash and nonspecific skin eruption    Thyroid disease    hypo   Urinary tract infection, site not specified    Vitamin B deficiency     Past Surgical History:  Procedure Laterality Date   COLON SURGERY     IR CATHETER TUBE CHANGE  05/07/2017   IR EXCHANGE BILIARY DRAIN  07/02/2017   IR EXCHANGE BILIARY DRAIN  07/10/2017   IR EXCHANGE BILIARY DRAIN  08/28/2017   IR EXCHANGE BILIARY DRAIN  10/11/2017   IR EXCHANGE BILIARY DRAIN  10/28/2017   IR EXCHANGE BILIARY DRAIN  12/23/2017   IR EXCHANGE BILIARY DRAIN  01/23/2018   IR EXCHANGE BILIARY DRAIN   02/17/2018   IR EXCHANGE BILIARY DRAIN  03/20/2018   IR EXCHANGE BILIARY DRAIN  04/17/2018   IR EXCHANGE BILIARY DRAIN  05/29/2018   IR EXCHANGE BILIARY DRAIN  07/24/2018   IR EXCHANGE BILIARY DRAIN  09/22/2018   IR EXCHANGE BILIARY DRAIN  11/20/2018   IR EXCHANGE BILIARY DRAIN  02/11/2019   IR EXCHANGE BILIARY DRAIN  04/14/2019   IR EXCHANGE BILIARY DRAIN  06/09/2019   IR EXCHANGE BILIARY DRAIN  11/26/2019   IR EXCHANGE BILIARY DRAIN  12/23/2019   IR EXCHANGE BILIARY DRAIN  02/22/2020   IR EXCHANGE BILIARY DRAIN  05/25/2020   IR EXCHANGE BILIARY DRAIN  07/29/2020   IR EXCHANGE BILIARY DRAIN  08/30/2020   IR EXCHANGE BILIARY DRAIN  10/06/2020   IR EXCHANGE BILIARY DRAIN  11/18/2020   IR EXCHANGE BILIARY DRAIN  12/21/2020   IR EXCHANGE BILIARY DRAIN  02/03/2021   IR EXCHANGE BILIARY DRAIN  03/07/2021   IR EXCHANGE BILIARY DRAIN  05/04/2021   IR EXCHANGE BILIARY DRAIN  06/22/2021   IR EXCHANGE BILIARY DRAIN  07/04/2021   IR EXCHANGE BILIARY DRAIN  08/10/2021   IR PERC CHOLECYSTOSTOMY  01/27/2017   IR PERC CHOLECYSTOSTOMY  11/18/2019   IR RADIOLOGIST EVAL & MGMT  03/05/2017   IR SINUS/FIST TUBE CHK-NON GI  10/06/2020   MASTECTOMY, PARTIAL Right      reports that she has  never smoked. She has never used smokeless tobacco. She reports that she does not drink alcohol and does not use drugs.  No Known Allergies  Family History  Problem Relation Age of Onset   Cancer Father        ? primary   Diabetes Neg Hx    Heart disease Neg Hx    Stroke Neg Hx     Prior to Admission medications   Medication Sig Start Date End Date Taking? Authorizing Provider  calcium carbonate (TUMS - DOSED IN MG ELEMENTAL CALCIUM) 500 MG chewable tablet Chew 1 tablet by mouth daily.    [provider]  Cholecalciferol 1000 units tablet Take 1,000 Units by mouth daily. hypocalcemia 01/30/17   [provider]  Cyanocobalamin (VITAMIN B-12) 1000 MCG SUBL Place 1 tablet under the tongue at bedtime.    [provider]  donepezil (ARICEPT) 10 MG tablet Take 1 tablet by mouth at bedtime. 01/24/17   [provider]  LORazepam (ATIVAN) 1 MG tablet Take 1 mg by mouth daily as needed for anxiety.    [provider]  NON FORMULARY Regular diet    [provider]  Nutritional Supplements (ENSURE ENLIVE PO) Take 1.5 kcal by mouth daily.    [provider]  Nutritional Supplements (NUTRITIONAL SUPPLEMENT PO) Take 1 each by mouth at bedtime. Magic Cup    [provider]  polyethylene glycol (MIRALAX / GLYCOLAX) packet Take 17 g by mouth daily as needed for mild constipation or moderate constipation.     [provider]  valACYclovir (VALTREX) 500 MG tablet Take 500 mg by mouth daily. Special Instructions: HSV suppression 09/15/20   [provider]    Physical Exam: Mostly uncooperative with exam, initially refusing some part of the physical exam Vitals:   12/13/21 1632 12/13/21 1800 12/13/21 1817  BP: (!) 135/55 130/62   Pulse: 93 88   Resp:  18   Temp: 99.2 F (37.3 C)  100 F (37.8 C)  TempSrc: Oral  Oral  SpO2: 92% 92%     Constitutional: NAD, calm, comfortable Vitals:   12/13/21 1632 12/13/21 1800 12/13/21 1817  BP: (!) 135/55 130/62   Pulse: 93 88   Resp:  18   Temp: 99.2 F (37.3 C)  100 F (37.8 C)  TempSrc: Oral  Oral  SpO2: 92% 92%    Eyes: PERRL, lids and conjunctivae normal ENMT: Mucous membranes are moist.   Neck: normal, supple, no masses, no thyromegaly Respiratory: clear to auscultation bilaterally, no wheezing, no crackles. Normal respiratory effort. No accessory muscle use.  Cardiovascular: Regular rate and rhythm, no murmurs / rubs / gallops. No extremity edema. 2+ pedal pulses.  Abdomen: Small healing ostomy site right upper abdomen, no tenderness, no masses palpated. No hepatosplenomegaly.   Musculoskeletal: no clubbing / cyanosis. No joint deformity upper and lower extremities. Good ROM, no contractures. Normal  muscle tone.  Skin: no rashes, lesions, ulcers. No induration Neurologic: No apparent cranial nerve abnormality, moving extremities spontaneously. Psychiatric: She Answers questions with questions like why do you want to know my name , why do you need to examine me.  Otherwise alert and oriented x person, she tells me she is in some sort of nursing facility.  Unable to assess further.  Labs on Admission: I have personally reviewed following labs and imaging studies  CBC: Recent Labs  Lab 12/13/21 1843  WBC 17.4*  NEUTROABS 14.9*  HGB 11.8*  HCT 36.6  MCV 98.4  PLT 962   Basic Metabolic Panel: Recent Labs  Lab 12/13/21 1843  NA 137  K 4.2  CL 102  CO2 27  GLUCOSE 132*  BUN 17  CREATININE 0.99  CALCIUM 8.7*   GFR: CrCl cannot be calculated (Unknown ideal weight.). Liver Function Tests: Recent Labs  Lab 12/13/21 1843  AST 12*  ALT 14  ALKPHOS 72  BILITOT 0.5  PROT 7.8  ALBUMIN 3.1*   Recent Labs  Lab 12/13/21 1843  LIPASE 36    Radiological Exams on Admission: CT Abdomen Pelvis W Contrast  Result Date: 12/13/2021 CLINICAL DATA:  Lower abdominal pain EXAM: CT ABDOMEN AND PELVIS WITH CONTRAST TECHNIQUE: Multidetector CT imaging of the abdomen and pelvis was performed using the standard protocol following bolus administration of intravenous contrast. RADIATION DOSE REDUCTION: This exam was performed according to the departmental dose-optimization program which includes automated exposure control, adjustment of the mA and/or kV according to patient size and/or use of iterative reconstruction technique. CONTRAST:  41mL OMNIPAQUE IOHEXOL 300 MG/ML  SOLN COMPARISON:  11/13/2019 FINDINGS: Lower chest: There are small linear densities in the lower lung fields. There is no pleural effusion. Dense calcification is seen in the mitral annulus. Hepatobiliary: Liver measures 17.5 cm. No focal abnormality is seen. Gallbladder is contracted. There is mild diffuse wall thickening in  gallbladder. Calcific density is seen in the neck of the gallbladder. Pancreas: No focal abnormality is seen. Small diverticulum is noted along the inner margin of duodenum. Spleen: Unremarkable. Adrenals/Urinary Tract: Adrenals are not enlarged. There is no hydronephrosis. Right kidney is much smaller than left with foci of cortical thinning suggesting scarring from chronic pyelonephritis. There are no renal or ureteral stones. Urinary bladder is unremarkable. Stomach/Bowel: Small hiatal hernia is seen. There is mild dilation of jejunum. Distal ileum is not dilated. Appendix is not distinctly seen. There is possible right hemicolectomy with ileocolic anastomosis in the right mid abdomen. Numerous diverticula are seen in the colon. There is wall thickening along with pericolic stranding at the junction of descending and sigmoid colon in the left iliac fossa. There is no loculated pericolic fluid collection. Vascular/Lymphatic: Atherosclerotic plaques and calcifications are seen in the aorta and its major branches. Reproductive: Ring pessary is seen in the vaginal vault. Other: There is no ascites or pneumoperitoneum. Musculoskeletal: Unremarkable. IMPRESSION: Diverticulosis of colon. There is wall thickening along with pericolic stranding at the junction of descending and sigmoid colon in the left iliac fossa consistent with acute diverticulitis. There is no loculated pericolic abscess. There is gallbladder wall thickening along with gallbladder stones. Gallbladder is not distended. Findings may suggest chronic cholecystitis. If there is clinical suspicion for acute cholecystitis, gallbladder sonogram may be considered. There is no hydronephrosis. Right kidney is small in size with multiple foci of cortical thinning suggesting chronic pyelonephritis. Small hiatal hernia. Other findings as described in the body of the report. Electronically Signed   By: Elmer Picker M.D.   On: 12/13/2021 20:39    EKG: None    Assessment/Plan Principal Problem:   Acute diverticulitis Active Problems:   Dementia (HCC)   S/P Cholecystostomy    Assessment and Plan: * Acute diverticulitis- (present on admission) Presents with abdominal pain and nausea.  Showing acute diverticulitis, no loculated pericolic abscess.  Other findings of chronic cholecystitis for which patient has required percutaneous tubes in the recent past she was not a candidate for surgery.  UA not suggestive of UTI.  Leukocytosis of 17.4, Tmax of 100, rules out for  sepsis. -Bowel rest with clear liquid diet -IV morphine 2 mg as needed -Continue IV Zosyn - d5 N/s 75cc/hr x 1 day  HTN (hypertension)- (present on admission) Stable not on antihypertensives.  Dementia ([place])- (present on admission) Awake alert and oriented to person, she has severe vascular dementia.  Per provider documentation from nursing home, patient is at her baseline mentally today. -Resume donepezil   DVT prophylaxis: Lovenox Code Status: DNR, universal DNR form at bedside. Family Communication: None at bedside.  Called patient's daughter Tea Collums, listed on demographics, I got voicemail but did not leave message. Disposition Plan: ~ 2 days Consults called: None Admission status: Inpt, tele I certify that at the point of admission it is my clinical judgment that the patient will require inpatient hospital care spanning beyond 2 midnights from the point of admission due to high intensity of service, high risk for further deterioration and high frequency of surveillance required.    Bethena Roys MD [facility]  12/13/2021, 10:42 PM

## 2021-12-13 NOTE — Progress Notes (Signed)
Pharmacy Antibiotic Note  Cynthia [name] is a 88 y.o. female admitted on 12/13/2021 with  intra-abdominal infection .  Pharmacy has been consulted for Zosyn dosing.  Plan: Zosyn 3.375g IV q8h (4 hour infusion).  Height: 5\' 3"  (160 cm) Weight: 61.7 kg (136 lb) IBW/kg (Calculated) : 52.4  Temp (24hrs), Avg:99.2 F (37.3 C), Min:98.5 F (36.9 C), Max:100 F (37.8 C)  Recent Labs  Lab 12/13/21 1843  WBC 17.4*  CREATININE 0.99    Estimated Creatinine Clearance: 28.7 mL/min (by C-G formula based on SCr of 0.99 mg/dL).    No Known Allergies   Thank you for allowing pharmacy to be a part of this patients care.  Wynona Neat, PharmD, BCPS  12/13/2021 11:10 PM

## 2021-12-13 NOTE — Assessment & Plan Note (Addendum)
Awake alert and oriented to person, she has severe vascular dementia.  Per provider documentation from nursing home, patient is at her baseline mentally today. Mental status remained stable through the hospitalization -Resumed donepezil

## 2021-12-13 NOTE — Assessment & Plan Note (Signed)
Stable not on antihypertensives 

## 2021-12-13 NOTE — ED Provider Notes (Signed)
[facility] EMERGENCY DEPARTMENT Provider Note   CSN: [id number] Arrival date & time: 12/13/21  1614     History  Chief Complaint  Patient presents with   Abdominal Pain    Patient went to have a biliary drain replace?, patient became no complaint during exam and was sent back to facility, staff stated patient now has temp and was sent in for evaluation     Cynthia [name] is a 88 y.o. female.  HPI  HPI will be deferred due to level 5 caveat dementia  Patient with medical history including breast cancer, hypertension, thyroid disease presents via EMS due to stomach pain.  Nursing staff informs me that the patient has been having some stomach tenderness, mainly her right upper quadrant, has a history of chronic cholecystitis, not a candidate for surgery and will get multiple biliary drains.  He states that he is not had a drain in a month's time but is concerned that she is having stomach pain there with pain infection.  No recent falls, not on anticoag's, states that he ate she had a subjective fever earlier today.  He was sent here for further evaluation.  Reviewed patient's chart patient's been having chronic cystitis, with multiple biliary drains, she has not had a drain in over a month's time.  Home Medications Prior to Admission medications   Medication Sig Start Date End Date Taking? Authorizing Provider  calcium carbonate (TUMS - DOSED IN MG ELEMENTAL CALCIUM) 500 MG chewable tablet Chew 1 tablet by mouth daily.    [provider]  Cholecalciferol 1000 units tablet Take 1,000 Units by mouth daily. hypocalcemia 01/30/17   [provider]  Cyanocobalamin (VITAMIN B-12) 1000 MCG SUBL Place 1 tablet under the tongue at bedtime.    [provider]  donepezil (ARICEPT) 10 MG tablet Take 1 tablet by mouth at bedtime. 01/24/17   [provider]  LORazepam (ATIVAN) 1 MG tablet Take 1 mg by mouth daily as needed for anxiety.    [provider]   NON FORMULARY Regular diet    [provider]  Nutritional Supplements (ENSURE ENLIVE PO) Take 1.5 kcal by mouth daily.    [provider]  Nutritional Supplements (NUTRITIONAL SUPPLEMENT PO) Take 1 each by mouth at bedtime. Magic Cup    [provider]  polyethylene glycol (MIRALAX / GLYCOLAX) packet Take 17 g by mouth daily as needed for mild constipation or moderate constipation.     [provider]  valACYclovir (VALTREX) 500 MG tablet Take 500 mg by mouth daily. Special Instructions: HSV suppression 09/15/20   [provider]      Allergies    Patient has no known allergies.    Review of Systems   Review of Systems  Unable to perform ROS: Mental status change   Physical Exam Updated Vital Signs BP 140/64 (BP Location: Left Arm)    Pulse 93    Temp 98.5 F (36.9 C) (Oral)    Resp 18    Ht 5\' 3"  (1.6 m)    Wt 61.7 kg    SpO2 93%    BMI 24.09 kg/m  Physical Exam Vitals and nursing note reviewed.  Constitutional:      General: She is not in acute distress.    Appearance: She is not ill-appearing.     Comments: Chronically ill-appearing  HENT:     Head: Normocephalic and atraumatic.     Comments: No deformity the head present, no raccoon eyes or  battle sign noted.    Nose: No congestion.     Mouth/Throat:     Mouth: Mucous membranes are moist.     Pharynx: Oropharynx is clear. No oropharyngeal exudate or posterior oropharyngeal erythema.  Eyes:     Conjunctiva/sclera: Conjunctivae normal.  Cardiovascular:     Rate and Rhythm: Normal rate and regular rhythm.     Pulses: Normal pulses.     Heart sounds: No murmur heard.   No friction rub. No gallop.  Pulmonary:     Effort: No respiratory distress.     Breath sounds: No wheezing, rhonchi or rales.  Abdominal:     Palpations: Abdomen is soft.     Tenderness: There is abdominal tenderness. There is no right CVA tenderness or left CVA tenderness.     Comments: Nondistended,  normoactive bowel sounds, dull to percussion, has generalized tenderness, does not focalized, there is no guarding, rebound tenderness, peritoneal sign.  Skin:    General: Skin is warm and dry.  Neurological:     Mental Status: She is alert.     Comments: No facial asymmetry, no difficult word finding, a follow two-step commands, no unilateral weakness present.  Psychiatric:        Mood and Affect: Mood normal.    ED Results / Procedures / Treatments   Labs (all labs ordered are listed, but only abnormal results are displayed) Labs Reviewed  COMPREHENSIVE METABOLIC PANEL - Abnormal; Notable for the following components:      Result Value   Glucose, Bld 132 (*)    Calcium 8.7 (*)    Albumin 3.1 (*)    AST 12 (*)    GFR, Estimated 53 (*)    All other components within normal limits  CBC WITH DIFFERENTIAL/PLATELET - Abnormal; Notable for the following components:   WBC 17.4 (*)    RBC 3.72 (*)    Hemoglobin 11.8 (*)    RDW 16.3 (*)    Neutro Abs 14.9 (*)    Monocytes Absolute 1.2 (*)    Abs Immature Granulocytes 0.12 (*)    All other components within normal limits  URINALYSIS, ROUTINE W REFLEX MICROSCOPIC - Abnormal; Notable for the following components:   Specific Gravity, Urine >1.046 (*)    Hgb urine dipstick SMALL (*)    Ketones, ur 5 (*)    All other components within normal limits  RESP PANEL BY RT-PCR (FLU A&B, COVID) [id number]  CULTURE, BLOOD (ROUTINE X 2)  CULTURE, BLOOD (ROUTINE X 2) W REFLEX TO ID PANEL  LIPASE, BLOOD    EKG None  Radiology CT Abdomen Pelvis W Contrast  Result Date: 12/13/2021 CLINICAL DATA:  Lower abdominal pain EXAM: CT ABDOMEN AND PELVIS WITH CONTRAST TECHNIQUE: Multidetector CT imaging of the abdomen and pelvis was performed using the standard protocol following bolus administration of intravenous contrast. RADIATION DOSE REDUCTION: This exam was performed according to the departmental dose-optimization program which includes automated  exposure control, adjustment of the mA and/or kV according to patient size and/or use of iterative reconstruction technique. CONTRAST:  50mL OMNIPAQUE IOHEXOL 300 MG/ML  SOLN COMPARISON:  11/13/2019 FINDINGS: Lower chest: There are small linear densities in the lower lung fields. There is no pleural effusion. Dense calcification is seen in the mitral annulus. Hepatobiliary: Liver measures 17.5 cm. No focal abnormality is seen. Gallbladder is contracted. There is mild diffuse wall thickening in gallbladder. Calcific density is seen in the neck of the gallbladder. Pancreas: No focal abnormality is seen. Small diverticulum  is noted along the inner margin of duodenum. Spleen: Unremarkable. Adrenals/Urinary Tract: Adrenals are not enlarged. There is no hydronephrosis. Right kidney is much smaller than left with foci of cortical thinning suggesting scarring from chronic pyelonephritis. There are no renal or ureteral stones. Urinary bladder is unremarkable. Stomach/Bowel: Small hiatal hernia is seen. There is mild dilation of jejunum. Distal ileum is not dilated. Appendix is not distinctly seen. There is possible right hemicolectomy with ileocolic anastomosis in the right mid abdomen. Numerous diverticula are seen in the colon. There is wall thickening along with pericolic stranding at the junction of descending and sigmoid colon in the left iliac fossa. There is no loculated pericolic fluid collection. Vascular/Lymphatic: Atherosclerotic plaques and calcifications are seen in the aorta and its major branches. Reproductive: Ring pessary is seen in the vaginal vault. Other: There is no ascites or pneumoperitoneum. Musculoskeletal: Unremarkable. IMPRESSION: Diverticulosis of colon. There is wall thickening along with pericolic stranding at the junction of descending and sigmoid colon in the left iliac fossa consistent with acute diverticulitis. There is no loculated pericolic abscess. There is gallbladder wall thickening  along with gallbladder stones. Gallbladder is not distended. Findings may suggest chronic cholecystitis. If there is clinical suspicion for acute cholecystitis, gallbladder sonogram may be considered. There is no hydronephrosis. Right kidney is small in size with multiple foci of cortical thinning suggesting chronic pyelonephritis. Small hiatal hernia. Other findings as described in the body of the report. Electronically Signed   By: Elmer Picker M.D.   On: 12/13/2021 20:39    Procedures Procedures    Medications Ordered in ED Medications  LORazepam (ATIVAN) injection 0.5 mg (has no administration in time range)  morphine (PF) 2 MG/ML injection 2 mg (has no administration in time range)  piperacillin-tazobactam (ZOSYN) IVPB 3.375 g (has no administration in time range)  iohexol (OMNIPAQUE) 300 MG/ML solution 100 mL (75 mLs Intravenous Contrast Given 12/13/21 2011)  piperacillin-tazobactam (ZOSYN) IVPB 3.375 g (3.375 g Intravenous New Bag/Given 12/13/21 2211)    ED Course/ Medical Decision Making/ A&P                           Medical Decision Making Amount and/or Complexity of Data Reviewed Labs: ordered. Radiology: ordered.  Risk Prescription drug management. Decision regarding hospitalization.   This patient presents to the ED for concern of stomach pain, this involves an extensive number of treatment options, and is a complaint that carries with it a high risk of complications and morbidity.  The differential diagnosis includes bowel obstruction, diverticulitis, intra-abdominal infection cholecystitis    Additional history obtained:  Additional history obtained from nursing facility External records from outside source obtained and reviewed including please see HPI   Co morbidities that complicate the patient evaluation  Chronic cholecystitis, dementia,  Social Determinants of Health:  Dementia    Lab Tests:  I Ordered, and personally interpreted labs.  The  pertinent results include: CBC shows leukocytosis 14.7, normocytic anemia hemoglobin 11.8 at baseline, CMP shows glucose of 132 calcium 8.7 albumin 3.1 AST 12 GFR 53, lipase 36, respiratory panel negative   Imaging Studies ordered:  I ordered imaging studies including CT and pelvis I independently visualized and interpreted imaging which showed reveals acute cholecystitis without perforation or abscess I agree with the radiologist interpretation   Cardiac Monitoring:  The patient was maintained on a cardiac monitor.  I personally viewed and interpreted the cardiac monitored which showed an underlying rhythm of: N/A  Medicines ordered and prescription drug management:  I ordered medication including antibiotics for diverticulitis I have reviewed the patients home medicines and have made adjustments as needed   Reevaluation:  Patient presents with abdominal tenderness, due to her medical history will obtain CT abdomen for further evaluation.  CT reveals acute diverticulitis, noncomplicated but due to her age will recommend admission will consult hospitalist team.  Consultations Obtained:  I requested consultation with the hospitalist Dr. Denton Brick okay,  and discussed lab and imaging findings as well as pertinent plan - they recommend: She will admit the patient    Rule out Low suspicion for systemic infection patient is nontoxic-appearing, she does not meet sepsis criteria as she is afebrile, nontachycardic.  I have low suspicion for lower lobe pneumonia as lung sounds are clear bilaterally will defer imaging at this time.  low suspicion for UTI [place] kidney stone UA is negative for signs infection or hematuria.  Low suspicion of bowel obstruction, volvulus, intra-abdominal abscess, CT imaging negative for these findings.  Low suspicion for CVA, intracranial head bleed patient has had no recent head trauma, not on anticoag, no focal deficit present my exam.  Low suspicion for abdominal  dissection as presentation atypical etiology more consistent with acute diverticulitis.    Dispostion and problem list  After consideration of the diagnostic results and the patients response to treatment, I feel that the patent would benefit from admission.  Diverticulitis-started on IV antibiotics, patient need further evaluation monitoring.            Final Clinical Impression(s) / ED Diagnoses Final diagnoses:  Diverticulitis    Rx / DC Orders ED Discharge Orders     None         Marcello Fennel, PA-C 12/14/21 0012    Godfrey Pick, MD 12/14/21 [phone]

## 2021-12-14 ENCOUNTER — Encounter (HOSPITAL_COMMUNITY): Payer: Self-pay | Admitting: Internal Medicine

## 2021-12-14 ENCOUNTER — Other Ambulatory Visit: Payer: Self-pay

## 2021-12-14 LAB — CBC
HCT: 33.6 % — ABNORMAL LOW (ref 36.0–46.0)
Hemoglobin: 10.5 g/dL — ABNORMAL LOW (ref 12.0–15.0)
MCH: 30.6 pg (ref 26.0–34.0)
MCHC: 31.3 g/dL (ref 30.0–36.0)
MCV: 98 fL (ref 80.0–100.0)
Platelets: 329 10*3/uL (ref 150–400)
RBC: 3.43 MIL/uL — ABNORMAL LOW (ref 3.87–5.11)
RDW: 16.4 % — ABNORMAL HIGH (ref 11.5–15.5)
WBC: 17.1 10*3/uL — ABNORMAL HIGH (ref 4.0–10.5)
nRBC: 0 % (ref 0.0–0.2)

## 2021-12-14 LAB — BASIC METABOLIC PANEL
Anion gap: 7 (ref 5–15)
BUN: 15 mg/dL (ref 8–23)
CO2: 27 mmol/L (ref 22–32)
Calcium: 8.6 mg/dL — ABNORMAL LOW (ref 8.9–10.3)
Chloride: 104 mmol/L (ref 98–111)
Creatinine, Ser: 0.94 mg/dL (ref 0.44–1.00)
GFR, Estimated: 56 mL/min — ABNORMAL LOW (ref >60)
Glucose, Bld: 113 mg/dL — ABNORMAL HIGH (ref 70–99)
Potassium: 4.1 mmol/L (ref 3.5–5.1)
Sodium: 138 mmol/L (ref 135–145)

## 2021-12-14 MED ORDER — POLYETHYLENE GLYCOL 3350 17 G PO PACK: 17.0000 g | PACK | Freq: Every day | ORAL | Status: DC | PRN

## 2021-12-14 MED ORDER — ACETAMINOPHEN 650 MG RE SUPP: 650.0000 mg | Freq: Four times a day (QID) | RECTAL | Status: DC | PRN

## 2021-12-14 MED ORDER — ENOXAPARIN SODIUM 30 MG/0.3ML IJ SOSY: 30.0000 mg | PREFILLED_SYRINGE | INTRAMUSCULAR | Status: DC

## 2021-12-14 MED ORDER — DEXTROSE-NACL 5-0.9 % IV SOLN: INTRAVENOUS | Status: DC

## 2021-12-14 MED ORDER — ONDANSETRON HCL 4 MG/2ML IJ SOLN
4.0000 mg | Freq: Four times a day (QID) | INTRAMUSCULAR | Status: DC | PRN
Start: 2021-12-14 — End: 2021-12-16

## 2021-12-14 MED ORDER — LORAZEPAM 1 MG PO TABS: 1.0000 mg | ORAL_TABLET | Freq: Every day | ORAL | Status: DC | PRN

## 2021-12-14 MED ORDER — ONDANSETRON HCL 4 MG PO TABS: 4.0000 mg | ORAL_TABLET | Freq: Four times a day (QID) | ORAL | Status: DC | PRN

## 2021-12-14 MED ORDER — ACETAMINOPHEN 325 MG PO TABS: 650.0000 mg | ORAL_TABLET | Freq: Four times a day (QID) | ORAL | Status: DC | PRN

## 2021-12-14 MED ORDER — DONEPEZIL HCL 5 MG PO TABS
10.0000 mg | ORAL_TABLET | Freq: Every day | ORAL | Status: DC
  Administered 2021-12-14 – 2021-12-15 (×2): 10 mg via ORAL
  Filled 2021-12-14 (×2): qty 2

## 2021-12-14 NOTE — Progress Notes (Addendum)
PROGRESS NOTE  Cynthia [name] MEQ:[id number] DOB: 04/26/1925 DOA: 12/13/2021 PCP: [name], NP  HPI/Recap of past 24 hours:  Cynthia [name] is a 88 y.o. female with medical history significant for dementia, hypertension, chronic cholecystitis.  Patient was brought to the ED from [facility] with reports of abdominal pain, guarding, nausea.  Work-up revealed acute diverticulitis for which she was started on Zosyn, IV morphine as needed for pain, and IV fluid hydration.  12/14/21: Patient was seen and examined at her bedside.  She denies any abdominal pain or nausea, likely unreliable.  She is pleasantly demented.  Assessment/Plan: Principal Problem:   Acute diverticulitis Active Problems:   Dementia ([place])   HTN (hypertension)   S/P Cholecystostomy  Acute diverticulitis- (present on admission) Presents with abdominal pain and nausea.  Showing acute diverticulitis, no loculated pericolic abscess.  Other findings of chronic cholecystitis for which patient has required percutaneous tubes in the recent past she was not a candidate for surgery.  UA not suggestive of UTI.  Leukocytosis of 17.4, Tmax of 100, rules out for sepsis. Continue bowel rest with clear liquid diet Continue-IV morphine 2 mg as needed Continue IV Zosyn Continue d5 N/s 75cc/hr x 1 day WBC elevated 17,000, monitor fever curve and WBC. Blood cultures negative to date, continue to follow cultures.   HTN (hypertension)- (present on admission) BP is at goal. Not on antihypertensives.   Dementia ([place])- (present on admission) Awake alert and oriented to person, she has severe vascular dementia.  Per provider documentation from nursing home, patient is at her baseline mentally today. Continue home donepezil     DVT prophylaxis: Lovenox  SQ daily. Code Status: DNR, universal DNR form at bedside. Family Communication: None at bedside.  Called patient's daughter Linday Rhodes, listed on demographics, I got  voicemail but did not leave message. Disposition Plan: ~ 2 days Consults called: None Admission status: Inpt, tele   Status is: Inpatient  Patient requires at least 2 midnights for further evaluation and treatment of present condition.      Objective: Vitals:   12/14/21 0155 12/14/21 0520 12/14/21 0941 12/14/21 1330  BP:  (!) 122/51 (!) 127/52 122/67  Pulse:  82 82 80  Resp:  19 17 16   Temp:  (!) 97.1 F (36.2 C) 98.6 F (37 C) 98 F (36.7 C)  TempSrc:    Oral  SpO2:  97% 98% 99%  Weight: 61.2 kg     Height:        Intake/Output Summary (Last 24 hours) at 12/14/2021 1400 Last data filed at 12/14/2021 1300 Gross per 24 hour  Intake 640 ml  Output --  Net 640 ml   Filed Weights   12/13/21 2300 12/14/21 0155  Weight: 61.7 kg 61.2 kg    Exam:  General: 88 y.o. year-old female frail-appearing in no acute distress.  Alert and pleasantly demented. Cardiovascular: Regular rate and rhythm with no rubs or gallops.  No thyromegaly or JVD noted.   Respiratory: Clear to auscultation with no wheezes or rales. Good inspiratory effort. Abdomen: Soft nontender nondistended with normal bowel sounds x4 quadrants. Musculoskeletal: No lower extremity edema bilaterally. Skin: No ulcerative lesions noted or rashes Psychiatry: Mood is appropriate for condition and setting   Data Reviewed: CBC: Recent Labs  Lab 12/13/21 1843 12/14/21 0509  WBC 17.4* 17.1*  NEUTROABS 14.9*  --   HGB 11.8* 10.5*  HCT 36.6 33.6*  MCV 98.4 98.0  PLT 361 297   Basic Metabolic  Panel: Recent Labs  Lab 12/13/21 1843 12/14/21 0509  NA 137 138  K 4.2 4.1  CL 102 104  CO2 27 27  GLUCOSE 132* 113*  BUN 17 15  CREATININE 0.99 0.94  CALCIUM 8.7* 8.6*   GFR: Estimated Creatinine Clearance: 30.3 mL/min (by C-G formula based on SCr of 0.94 mg/dL). Liver Function Tests: Recent Labs  Lab 12/13/21 1843  AST 12*  ALT 14  ALKPHOS 72  BILITOT 0.5  PROT 7.8  ALBUMIN 3.1*   Recent Labs  Lab  12/13/21 1843  LIPASE 36   No results for input(s): AMMONIA in the last 168 hours. Coagulation Profile: No results for input(s): INR, PROTIME in the last 168 hours. Cardiac Enzymes: No results for input(s): CKTOTAL, CKMB, CKMBINDEX, TROPONINI in the last 168 hours. BNP (last 3 results) No results for input(s): PROBNP in the last 8760 hours. HbA1C: No results for input(s): HGBA1C in the last 72 hours. CBG: No results for input(s): GLUCAP in the last 168 hours. Lipid Profile: No results for input(s): CHOL, HDL, LDLCALC, TRIG, CHOLHDL, LDLDIRECT in the last 72 hours. Thyroid Function Tests: No results for input(s): TSH, T4TOTAL, FREET4, T3FREE, THYROIDAB in the last 72 hours. Anemia Panel: No results for input(s): VITAMINB12, FOLATE, FERRITIN, TIBC, IRON, RETICCTPCT in the last 72 hours. Urine analysis:    Component Value Date/Time   COLORURINE YELLOW 12/13/2021 2143   APPEARANCEUR CLEAR 12/13/2021 2143   LABSPEC >1.046 (H) 12/13/2021 2143   PHURINE 5.0 12/13/2021 2143   GLUCOSEU NEGATIVE 12/13/2021 2143   HGBUR SMALL (A) 12/13/2021 2143   BILIRUBINUR NEGATIVE 12/13/2021 2143   KETONESUR 5 (A) 12/13/2021 2143   PROTEINUR NEGATIVE 12/13/2021 2143   UROBILINOGEN 0.2 12/08/2013 1848   NITRITE NEGATIVE 12/13/2021 2143   LEUKOCYTESUR NEGATIVE 12/13/2021 2143   Sepsis Labs: @LABRCNTIP (procalcitonin:4,lacticidven:4)  ) Recent Results (from the past 240 hour(s))  Resp Panel by RT-PCR (Flu A&B, Covid) Nasopharyngeal Swab     Status: None   Collection Time: 12/13/21  6:30 PM   Specimen: Nasopharyngeal Swab; Nasopharyngeal(NP) swabs in vial transport medium  Result Value Ref Range Status   SARS Coronavirus 2 by RT PCR NEGATIVE NEGATIVE Final    Comment: (NOTE) SARS-CoV-2 target nucleic acids are NOT DETECTED.  The SARS-CoV-2 RNA is generally detectable in upper respiratory specimens during the acute phase of infection. The lowest concentration of SARS-CoV-2 viral copies this  assay can detect is 138 copies/mL. A negative result does not preclude SARS-Cov-2 infection and should not be used as the sole basis for treatment or other patient management decisions. A negative result may occur with  improper specimen collection/handling, submission of specimen other than nasopharyngeal swab, presence of viral mutation(s) within the areas targeted by this assay, and inadequate number of viral copies(<138 copies/mL). A negative result must be combined with clinical observations, patient history, and epidemiological information. The expected result is Negative.  Fact Sheet for Patients:  EntrepreneurPulse.com.au  Fact Sheet for Healthcare Providers:  IncredibleEmployment.be  This test is no t yet approved or cleared by the [place] FDA and  has been authorized for detection and/or diagnosis of SARS-CoV-2 by FDA under an Emergency Use Authorization (EUA). This EUA will remain  in effect (meaning this test can be used) for the duration of the COVID-19 declaration under Section 564(b)(1) of the Act, 21 U.S.C.section 360bbb-3(b)(1), unless the authorization is terminated  or revoked sooner.       Influenza A by PCR NEGATIVE NEGATIVE Final   Influenza B by  PCR NEGATIVE NEGATIVE Final    Comment: (NOTE) The Xpert Xpress SARS-CoV-2/FLU/RSV plus assay is intended as an aid in the diagnosis of influenza from Nasopharyngeal swab specimens and should not be used as a sole basis for treatment. Nasal washings and aspirates are unacceptable for Xpert Xpress SARS-CoV-2/FLU/RSV testing.  Fact Sheet for Patients: EntrepreneurPulse.com.au  Fact Sheet for Healthcare Providers: IncredibleEmployment.be  This test is not yet approved or cleared by the [place] FDA and has been authorized for detection and/or diagnosis of SARS-CoV-2 by FDA under an Emergency Use Authorization (EUA). This EUA will  remain in effect (meaning this test can be used) for the duration of the COVID-19 declaration under Section 564(b)(1) of the Act, 21 U.S.C. section 360bbb-3(b)(1), unless the authorization is terminated or revoked.  Performed at [facility], [address], [place], [place] [address]   Blood culture (routine x 2)     Status: None (Preliminary result)   Collection Time: 12/13/21  9:42 PM   Specimen: BLOOD  Result Value Ref Range Status   Specimen Description BLOOD BLOOD LEFT HAND  Final   Special Requests   Final    BOTTLES DRAWN AEROBIC AND ANAEROBIC Blood Culture adequate volume   Culture   Final    NO GROWTH < 12 HOURS Performed at [facility], [address], [place],  [address]    Report Status PENDING  Incomplete      Studies: CT Abdomen Pelvis W Contrast  Result Date: 12/13/2021 CLINICAL DATA:  Lower abdominal pain EXAM: CT ABDOMEN AND PELVIS WITH CONTRAST TECHNIQUE: Multidetector CT imaging of the abdomen and pelvis was performed using the standard protocol following bolus administration of intravenous contrast. RADIATION DOSE REDUCTION: This exam was performed according to the departmental dose-optimization program which includes automated exposure control, adjustment of the mA and/or kV according to patient size and/or use of iterative reconstruction technique. CONTRAST:  39mL OMNIPAQUE IOHEXOL 300 MG/ML  SOLN COMPARISON:  11/13/2019 FINDINGS: Lower chest: There are small linear densities in the lower lung fields. There is no pleural effusion. Dense calcification is seen in the mitral annulus. Hepatobiliary: Liver measures 17.5 cm. No focal abnormality is seen. Gallbladder is contracted. There is mild diffuse wall thickening in gallbladder. Calcific density is seen in the neck of the gallbladder. Pancreas: No focal abnormality is seen. Small diverticulum is noted along the inner margin of duodenum. Spleen: Unremarkable. Adrenals/Urinary Tract: Adrenals are not enlarged.  There is no hydronephrosis. Right kidney is much smaller than left with foci of cortical thinning suggesting scarring from chronic pyelonephritis. There are no renal or ureteral stones. Urinary bladder is unremarkable. Stomach/Bowel: Small hiatal hernia is seen. There is mild dilation of jejunum. Distal ileum is not dilated. Appendix is not distinctly seen. There is possible right hemicolectomy with ileocolic anastomosis in the right mid abdomen. Numerous diverticula are seen in the colon. There is wall thickening along with pericolic stranding at the junction of descending and sigmoid colon in the left iliac fossa. There is no loculated pericolic fluid collection. Vascular/Lymphatic: Atherosclerotic plaques and calcifications are seen in the aorta and its major branches. Reproductive: Ring pessary is seen in the vaginal vault. Other: There is no ascites or pneumoperitoneum. Musculoskeletal: Unremarkable. IMPRESSION: Diverticulosis of colon. There is wall thickening along with pericolic stranding at the junction of descending and sigmoid colon in the left iliac fossa consistent with acute diverticulitis. There is no loculated pericolic abscess. There is gallbladder wall thickening along with gallbladder stones. Gallbladder is not distended. Findings  may suggest chronic cholecystitis. If there is clinical suspicion for acute cholecystitis, gallbladder sonogram may be considered. There is no hydronephrosis. Right kidney is small in size with multiple foci of cortical thinning suggesting chronic pyelonephritis. Small hiatal hernia. Other findings as described in the body of the report. Electronically Signed   By: Elmer Picker M.D.   On: 12/13/2021 20:39    Scheduled Meds:  donepezil  10 mg Oral QHS   enoxaparin (LOVENOX) injection  30 mg Subcutaneous Q24H    Continuous Infusions:  dextrose 5 % and 0.9% NaCl 75 mL/hr at 12/14/21 0547   piperacillin-tazobactam (ZOSYN)  IV 3.375 g (12/14/21 1142)      LOS: 1 day     Kayleen Memos, MD [facility] Pager [phone]  If 7PM-7AM, please contact night-coverage [email or web address] Password [email or web address] 12/14/2021, 2:00 PM

## 2021-12-14 NOTE — Progress Notes (Signed)
Patient refused to go to bed at this time. She wanted to continue sitting in the chair, after being in the chair all day. Patient did agree to get on the bsc. She voided, foam applied to bottom d/t redness, and adjusted back In the chair.

## 2021-12-14 NOTE — Plan of Care (Signed)
°  Problem: Education: °Goal: Knowledge of General Education information will improve °Description: Including pain rating scale, medication(s)/side effects and non-pharmacologic comfort measures °Outcome: Not Progressing °  °Problem: Health Behavior/Discharge Planning: °Goal: Ability to manage health-related needs will improve °Outcome: Not Progressing °  °Problem: Clinical Measurements: °Goal: Ability to maintain clinical measurements within normal limits will improve °Outcome: Progressing °  °

## 2021-12-14 NOTE — Progress Notes (Signed)
Patient has been stable.  Only c/o pain when patient being touched.  Patient ambulated to bathroom with assistance.  Vitals stable.

## 2021-12-15 LAB — CBC
HCT: 31.7 % — ABNORMAL LOW (ref 36.0–46.0)
Hemoglobin: 10.2 g/dL — ABNORMAL LOW (ref 12.0–15.0)
MCH: 31.9 pg (ref 26.0–34.0)
MCHC: 32.2 g/dL (ref 30.0–36.0)
MCV: 99.1 fL (ref 80.0–100.0)
Platelets: 314 10*3/uL (ref 150–400)
RBC: 3.2 MIL/uL — ABNORMAL LOW (ref 3.87–5.11)
RDW: 16.2 % — ABNORMAL HIGH (ref 11.5–15.5)
WBC: 11.4 10*3/uL — ABNORMAL HIGH (ref 4.0–10.5)
nRBC: 0 % (ref 0.0–0.2)

## 2021-12-15 LAB — PHOSPHORUS: Phosphorus: 3.2 mg/dL (ref 2.5–4.6)

## 2021-12-15 LAB — BASIC METABOLIC PANEL
Anion gap: 8 (ref 5–15)
BUN: 13 mg/dL (ref 8–23)
CO2: 26 mmol/L (ref 22–32)
Calcium: 8.4 mg/dL — ABNORMAL LOW (ref 8.9–10.3)
Chloride: 105 mmol/L (ref 98–111)
Creatinine, Ser: 1 mg/dL (ref 0.44–1.00)
GFR, Estimated: 52 mL/min — ABNORMAL LOW (ref >60)
Glucose, Bld: 103 mg/dL — ABNORMAL HIGH (ref 70–99)
Potassium: 3.5 mmol/L (ref 3.5–5.1)
Sodium: 139 mmol/L (ref 135–145)

## 2021-12-15 LAB — MAGNESIUM: Magnesium: 2.3 mg/dL (ref 1.7–2.4)

## 2021-12-15 MED ORDER — AMOXICILLIN-POT CLAVULANATE 875-125 MG PO TABS: 1.0000 | ORAL_TABLET | Freq: Two times a day (BID) | ORAL | Status: DC

## 2021-12-15 MED ORDER — AMOXICILLIN-POT CLAVULANATE 500-125 MG PO TABS
1.0000 | ORAL_TABLET | Freq: Two times a day (BID) | ORAL | Status: DC
  Administered 2021-12-15 – 2021-12-16 (×2): 500 mg via ORAL
  Filled 2021-12-15 (×2): qty 1

## 2021-12-15 NOTE — Progress Notes (Signed)
Patient tolerated soft diet well. Notified DO.

## 2021-12-15 NOTE — Progress Notes (Signed)
Noted that patients iv was bleeding and leaking. Tried to get another IV started with patient and she refused to be stuck again. Notified DO. N.O for PO antibiotics and fluids d/c'd.

## 2021-12-15 NOTE — Progress Notes (Signed)
PROGRESS NOTE  Cynthia [name] MLY:[id number] DOB: 1924/11/24 DOA: 12/13/2021 PCP: [name], NP  HPI/Recap of past 24 hours:  Cynthia [name] is a 88 y.o. female with medical history significant for dementia, hypertension, chronic cholecystitis.  Patient was brought to the ED from [facility] with reports of abdominal pain, guarding, nausea.  Work-up revealed acute diverticulitis for which she was started on Zosyn, IV morphine as needed for pain, and IV fluid hydration.  12/15/21: Saw this patient this morning but refused to be examined.  She denies having any pain.  Leukocytosis is downtrending and is afebrile.  Blood cultures negative to date.  We will switch antibiotics to oral for DC planning.  Assessment/Plan: Principal Problem:   Acute diverticulitis Active Problems:   Dementia ([place])   HTN (hypertension)   S/P Cholecystostomy  Acute diverticulitis- (present on admission) Presents with abdominal pain and nausea.  Showing acute diverticulitis, no loculated pericolic abscess.  Other findings of chronic cholecystitis for which patient has required percutaneous tubes in the recent past she was not a candidate for surgery.  UA not suggestive of UTI.  Leukocytosis of 17.4, Tmax of 100, rules out for sepsis. Continue bowel rest with clear liquid diet Continue-IV morphine 2 mg as needed Continue IV Zosyn, plan to switch to oral antibiotics for DC planning. Leukocytosis is resolving Continue to monitor WBC and fever curve. Blood cultures negative to date   HTN (hypertension)- (present on admission) BP is at goal. Not on antihypertensives.   Dementia ([place])- (present on admission) Awake alert and oriented to person, she has severe vascular dementia.  Per provider documentation from nursing home, patient is at her baseline mentally today. Continue home donepezil     DVT prophylaxis: Lovenox  SQ daily. Code Status: DNR, universal DNR form at bedside. Family Communication: None  at bedside.  Called patient's daughter Pebble Botkin, listed on demographics, I got voicemail but did not leave message. Disposition Plan: ~ 2 days Consults called: None Admission status: Inpt, tele   Status is: Inpatient  Patient requires at least 2 midnights for further evaluation and treatment of present condition.      Objective: Vitals:   12/14/21 0941 12/14/21 1330 12/15/21 0447 12/15/21 1201  BP: (!) 127/52 122/67 (!) 116/44 (!) 117/53  Pulse: 82 80 66 71  Resp: 17 16 16 16   Temp: 98.6 F (37 C) 98 F (36.7 C) 97.9 F (36.6 C)   TempSrc:  Oral    SpO2: 98% 99% 94% 95%  Weight:      Height:        Intake/Output Summary (Last 24 hours) at 12/15/2021 1429 Last data filed at 12/14/2021 2104 Gross per 24 hour  Intake 640 ml  Output --  Net 640 ml   Filed Weights   12/13/21 2300 12/14/21 0155  Weight: 61.7 kg 61.2 kg    Exam: Declines physical examination.  General: 88 y.o. year-old female frail-appearing in no acute distress.  Alert and pleasantly demented. Cardiovascular: Regular rate and rhythm with no rubs or gallops.  No thyromegaly or JVD noted.   Respiratory: Clear to auscultation with no wheezes or rales. Good inspiratory effort. Abdomen: Soft nontender nondistended with normal bowel sounds x4 quadrants. Musculoskeletal: No lower extremity edema bilaterally. Skin: No ulcerative lesions noted or rashes Psychiatry: Mood is appropriate for condition and setting   Data Reviewed: CBC: Recent Labs  Lab 12/13/21 1843 12/14/21 0509 12/15/21 0540  WBC 17.4* 17.1* 11.4*  NEUTROABS 14.9*  --   --  HGB 11.8* 10.5* 10.2*  HCT 36.6 33.6* 31.7*  MCV 98.4 98.0 99.1  PLT 361 329 494   Basic Metabolic Panel: Recent Labs  Lab 12/13/21 1843 12/14/21 0509 12/15/21 0540  NA 137 138 139  K 4.2 4.1 3.5  CL 102 104 105  CO2 27 27 26   GLUCOSE 132* 113* 103*  BUN 17 15 13   CREATININE 0.99 0.94 1.00  CALCIUM 8.7* 8.6* 8.4*  MG  --   --  2.3  PHOS  --    --  3.2   GFR: Estimated Creatinine Clearance: 28.5 mL/min (by C-G formula based on SCr of 1 mg/dL). Liver Function Tests: Recent Labs  Lab 12/13/21 1843  AST 12*  ALT 14  ALKPHOS 72  BILITOT 0.5  PROT 7.8  ALBUMIN 3.1*   Recent Labs  Lab 12/13/21 1843  LIPASE 36   No results for input(s): AMMONIA in the last 168 hours. Coagulation Profile: No results for input(s): INR, PROTIME in the last 168 hours. Cardiac Enzymes: No results for input(s): CKTOTAL, CKMB, CKMBINDEX, TROPONINI in the last 168 hours. BNP (last 3 results) No results for input(s): PROBNP in the last 8760 hours. HbA1C: No results for input(s): HGBA1C in the last 72 hours. CBG: No results for input(s): GLUCAP in the last 168 hours. Lipid Profile: No results for input(s): CHOL, HDL, LDLCALC, TRIG, CHOLHDL, LDLDIRECT in the last 72 hours. Thyroid Function Tests: No results for input(s): TSH, T4TOTAL, FREET4, T3FREE, THYROIDAB in the last 72 hours. Anemia Panel: No results for input(s): VITAMINB12, FOLATE, FERRITIN, TIBC, IRON, RETICCTPCT in the last 72 hours. Urine analysis:    Component Value Date/Time   COLORURINE YELLOW 12/13/2021 2143   APPEARANCEUR CLEAR 12/13/2021 2143   LABSPEC >1.046 (H) 12/13/2021 2143   PHURINE 5.0 12/13/2021 2143   GLUCOSEU NEGATIVE 12/13/2021 2143   HGBUR SMALL (A) 12/13/2021 2143   BILIRUBINUR NEGATIVE 12/13/2021 2143   KETONESUR 5 (A) 12/13/2021 2143   PROTEINUR NEGATIVE 12/13/2021 2143   UROBILINOGEN 0.2 12/08/2013 1848   NITRITE NEGATIVE 12/13/2021 2143   LEUKOCYTESUR NEGATIVE 12/13/2021 2143   Sepsis Labs: @LABRCNTIP (procalcitonin:4,lacticidven:4)  ) Recent Results (from the past 240 hour(s))  Resp Panel by RT-PCR (Flu A&B, Covid) Nasopharyngeal Swab     Status: None   Collection Time: 12/13/21  6:30 PM   Specimen: Nasopharyngeal Swab; Nasopharyngeal(NP) swabs in vial transport medium  Result Value Ref Range Status   SARS Coronavirus 2 by RT PCR NEGATIVE  NEGATIVE Final    Comment: (NOTE) SARS-CoV-2 target nucleic acids are NOT DETECTED.  The SARS-CoV-2 RNA is generally detectable in upper respiratory specimens during the acute phase of infection. The lowest concentration of SARS-CoV-2 viral copies this assay can detect is 138 copies/mL. A negative result does not preclude SARS-Cov-2 infection and should not be used as the sole basis for treatment or other patient management decisions. A negative result may occur with  improper specimen collection/handling, submission of specimen other than nasopharyngeal swab, presence of viral mutation(s) within the areas targeted by this assay, and inadequate number of viral copies(<138 copies/mL). A negative result must be combined with clinical observations, patient history, and epidemiological information. The expected result is Negative.  Fact Sheet for Patients:  EntrepreneurPulse.com.au  Fact Sheet for Healthcare Providers:  IncredibleEmployment.be  This test is no t yet approved or cleared by the [place] FDA and  has been authorized for detection and/or diagnosis of SARS-CoV-2 by FDA under an Emergency Use Authorization (EUA). This EUA will remain  in effect (meaning this test can be used) for the duration of the COVID-19 declaration under Section 564(b)(1) of the Act, 21 U.S.C.section 360bbb-3(b)(1), unless the authorization is terminated  or revoked sooner.       Influenza A by PCR NEGATIVE NEGATIVE Final   Influenza B by PCR NEGATIVE NEGATIVE Final    Comment: (NOTE) The Xpert Xpress SARS-CoV-2/FLU/RSV plus assay is intended as an aid in the diagnosis of influenza from Nasopharyngeal swab specimens and should not be used as a sole basis for treatment. Nasal washings and aspirates are unacceptable for Xpert Xpress SARS-CoV-2/FLU/RSV testing.  Fact Sheet for Patients: EntrepreneurPulse.com.au  Fact Sheet for Healthcare  Providers: IncredibleEmployment.be  This test is not yet approved or cleared by the [place] FDA and has been authorized for detection and/or diagnosis of SARS-CoV-2 by FDA under an Emergency Use Authorization (EUA). This EUA will remain in effect (meaning this test can be used) for the duration of the COVID-19 declaration under Section 564(b)(1) of the Act, 21 U.S.C. section 360bbb-3(b)(1), unless the authorization is terminated or revoked.  Performed at [facility], [address], [place], [place] [address]   Blood culture (routine x 2)     Status: None (Preliminary result)   Collection Time: 12/13/21  9:42 PM   Specimen: BLOOD  Result Value Ref Range Status   Specimen Description BLOOD BLOOD LEFT HAND  Final   Special Requests   Final    BOTTLES DRAWN AEROBIC AND ANAEROBIC Blood Culture adequate volume   Culture   Final    NO GROWTH 2 DAYS Performed at [facility], [address], [place], [place] [address]    Report Status PENDING  Incomplete      Studies: No results found.  Scheduled Meds:  donepezil  10 mg Oral QHS   enoxaparin (LOVENOX) injection  30 mg Subcutaneous Q24H    Continuous Infusions:  dextrose 5 % and 0.9% NaCl 50 mL/hr at 12/14/21 1637   piperacillin-tazobactam (ZOSYN)  IV 3.375 g (12/15/21 1230)     LOS: 2 days     Kayleen Memos, MD [facility] Pager [phone]  If 7PM-7AM, please contact night-coverage [email or web address] Password [facility] 12/15/2021, 2:29 PM

## 2021-12-15 NOTE — Progress Notes (Signed)
Patient refused to be washed up this morning by staff.

## 2021-12-15 NOTE — TOC Initial Note (Signed)
Transition of Care [facility]) - Initial/Assessment Note    Patient Details  Name: Cynthia [name] MRN: [id number] Date of Birth: 04-20-1925  [facility] CM/SW Contact:    Boneta Lucks, RN Phone Number: 12/15/2021, 1:55 PM  Clinical Narrative:    Patient admitted with acute diverticulitis. Patient is LTC at [facility]. May be ready to discharge back to [facility] Saturday. Confirm with Tami at [facility], patient can come back over the weekend, No PT or INS Auth needed.            Expected Discharge Plan: Long Term Acute Care (LTAC) Barriers to Discharge: Continued Medical Work up  Patient Goals and CMS Choice Patient states their goals for this hospitalization and ongoing recovery are:: to return to [facility]. CMS Medicare.gov Compare Post Acute Care list provided to:: Patient Choice offered to / list presented to : Patient  Expected Discharge Plan and Services Expected Discharge Plan: [place] (LTAC)                  Activities of Daily Living Home Assistive Devices/Equipment: None ADL Screening (condition at time of admission) Patient's cognitive ability adequate to safely complete daily activities?: Yes Is the patient deaf or have difficulty hearing?: No Does the patient have difficulty seeing, even when wearing glasses/contacts?: No Does the patient have difficulty concentrating, remembering, or making decisions?: Yes Patient able to express need for assistance with ADLs?: No Does the patient have difficulty dressing or bathing?: Yes Independently performs ADLs?: No Communication: Independent Dressing (OT): Dependent Is this a change from baseline?: Pre-admission baseline Grooming: Dependent Is this a change from baseline?: Pre-admission baseline Feeding: Independent Bathing: Needs assistance Is this a change from baseline?: Pre-admission baseline Toileting: Needs assistance Is this a change from baseline?: Pre-admission baseline In/Out Bed: Needs  assistance Is this a change from baseline?: Pre-admission baseline Walks in Home: Needs assistance Is this a change from baseline?: Pre-admission baseline Does the patient have difficulty walking or climbing stairs?: No Weakness of Legs: None Weakness of Arms/Hands: None  Permission Sought/Granted    Admission diagnosis:  Diverticulitis [K57.92] Acute diverticulitis [K57.92] Patient Active Problem List   Diagnosis Date Noted   Acute diverticulitis 12/13/2021   Calculus of gall bladder and bile duct with nonacute cholecystitis with obstruction 04/25/2021   Herpes 02/20/2021   History of breast cancer 01/20/2021   Aortic atherosclerosis ([place]) 11/15/2020   Medicare annual wellness visit, subsequent 06/06/2020   Vitamin B12 deficiency 11/28/2019   Vitamin D deficiency 11/28/2019   Chronic constipation 12/20/2018   S/P Cholecystostomy 01/31/2017   HTN (hypertension) 08/26/2016   Dementia ([place]) 12/11/2013   PCP:  [name], NP Pharmacy:   [facility] - [place], [place] - [address] [place] [address] Phone: [phone] Fax: [phone]   Readmission Risk Interventions No flowsheet data found.

## 2021-12-16 DIAGNOSIS — F0390 Unspecified dementia without behavioral disturbance: Secondary | ICD-10-CM

## 2021-12-16 LAB — BASIC METABOLIC PANEL
Anion gap: 7 (ref 5–15)
BUN: 10 mg/dL (ref 8–23)
CO2: 27 mmol/L (ref 22–32)
Calcium: 8.8 mg/dL — ABNORMAL LOW (ref 8.9–10.3)
Chloride: 105 mmol/L (ref 98–111)
Creatinine, Ser: 0.94 mg/dL (ref 0.44–1.00)
GFR, Estimated: 56 mL/min — ABNORMAL LOW (ref >60)
Glucose, Bld: 93 mg/dL (ref 70–99)
Potassium: 3.5 mmol/L (ref 3.5–5.1)
Sodium: 139 mmol/L (ref 135–145)

## 2021-12-16 LAB — CBC
HCT: 35.6 % — ABNORMAL LOW (ref 36.0–46.0)
Hemoglobin: 11 g/dL — ABNORMAL LOW (ref 12.0–15.0)
MCH: 30.4 pg (ref 26.0–34.0)
MCHC: 30.9 g/dL (ref 30.0–36.0)
MCV: 98.3 fL (ref 80.0–100.0)
Platelets: 358 10*3/uL (ref 150–400)
RBC: 3.62 MIL/uL — ABNORMAL LOW (ref 3.87–5.11)
RDW: 15.9 % — ABNORMAL HIGH (ref 11.5–15.5)
WBC: 7.4 10*3/uL (ref 4.0–10.5)
nRBC: 0 % (ref 0.0–0.2)

## 2021-12-16 MED ORDER — AMOXICILLIN-POT CLAVULANATE 500-125 MG PO TABS: 1.0000 | ORAL_TABLET | Freq: Two times a day (BID) | ORAL | 0 refills | Status: DC

## 2021-12-16 NOTE — Discharge Summary (Signed)
Physician Discharge Summary   Patient: Cynthia [name] MRN: [id number] DOB: 1925/08/29  Admit date:     12/13/2021  Discharge date: 12/16/21  Discharge Physician: [name] [name]   PCP: Cynthia Fee, NP   Recommendations at discharge:   Please follow up with primary care provider within 1-2 weeks  Please repeat BMP and CBC in one week    Discharge Diagnoses: Principal Problem:   Acute diverticulitis Active Problems:   Major neurocognitive disorder (Cynthia [name])   HTN (hypertension)     Hospital Course: SHERRE WOOTON is a 88 y.o. female with medical history significant for dementia, hypertension, chronic cholecystitis.  Patient was brought to the ED from [facility] with reports of abdominal pain, guarding, nausea.  Work-up revealed acute diverticulitis for which she was started on Zosyn, IV morphine as needed for pain, and IV fluid hydration.   12/15/21: Saw this patient this morning but refused to be examined.  She denies having any pain.  Leukocytosis is downtrending and is afebrile.  Blood cultures negative to date.  We will switch antibiotics to oral for DC planning.   12/16/21:  Patient allowed examination.  Tolerating soft diet.  Denies any cp, abd pain, sob.  Remainder ROS not possible due to dementia.  She asks, "am I supposed to be sick."  "I'm feeling fine".  WBC improved to 7.4.  Assessment and Plan: * Acute diverticulitis- (present on admission) Presents with abdominal pain and nausea.  Showing acute diverticulitis, no loculated pericolic abscess.  Other findings of chronic cholecystitis for which patient has required percutaneous tubes in the recent past she was not a candidate for surgery.  UA not suggestive of UTI.  Leukocytosis of 17.4, Tmax of 100, rules out for sepsis. -Bowel rest with clear liquid diet>>advanced to soft diet which patient tolerated -IV morphine 2 mg as needed>>has not required any -Continue IV Zosyn>>d/c with amox/clav x 7 more days - d5 N/s 75cc/hr x  1 day>>saline locked  HTN (hypertension)- (present on admission) Stable not on antihypertensives.  Major neurocognitive disorder (Cynthia [name]) Awake alert and oriented to person, she has severe vascular dementia.  Per provider documentation from nursing home, patient is at her baseline mentally today. Mental status remained stable through the hospitalization -Resumed donepezil           Consultants: none Procedures performed: none   Disposition: Skilled nursing facility Diet recommendation:  Dysphagia type 3 thin  Liquid  DISCHARGE MEDICATION: Allergies as of 12/16/2021   No Known Allergies      Medication List     TAKE these medications    amoxicillin-clavulanate 500-125 MG tablet Commonly known as: AUGMENTIN Take 1 tablet (500 mg total) by mouth 2 (two) times daily. X 7 days   calcium carbonate 500 MG chewable tablet Commonly known as: TUMS - dosed in mg elemental calcium Chew 1 tablet by mouth daily.   donepezil 10 MG tablet Commonly known as: ARICEPT Take 1 tablet by mouth at bedtime.   ENSURE ENLIVE PO Take 1.5 kcal by mouth daily.   LORazepam 1 MG tablet Commonly known as: ATIVAN Take 1 mg by mouth daily as needed for anxiety.   polyethylene glycol 17 g packet Commonly known as: MIRALAX / GLYCOLAX Take 17 g by mouth daily as needed for mild constipation or moderate constipation.   valACYclovir 500 MG tablet Commonly known as: VALTREX Take 500 mg by mouth daily. Special Instructions: HSV suppression   Vitamin B-12 1000 MCG Subl Place 1 tablet under the  tongue at bedtime.         Discharge Exam: Filed Weights   12/13/21 2300 12/14/21 0155  Weight: 61.7 kg 61.2 kg   HEENT:  [place]/AT, No thrush, no icterus CV:  RRR, no rub, no S3, no S4 Lung:  CTA, no wheeze, no rhonchi Abd:  soft/+BS, NT Ext:  No edema, no lymphangitis, no synovitis, no rash   Condition at discharge: stable  The results of significant diagnostics from this hospitalization  (including imaging, microbiology, ancillary and laboratory) are listed below for reference.   Imaging Studies: CT Abdomen Pelvis W Contrast  Result Date: 12/13/2021 CLINICAL DATA:  Lower abdominal pain EXAM: CT ABDOMEN AND PELVIS WITH CONTRAST TECHNIQUE: Multidetector CT imaging of the abdomen and pelvis was performed using the standard protocol following bolus administration of intravenous contrast. RADIATION DOSE REDUCTION: This exam was performed according to the departmental dose-optimization program which includes automated exposure control, adjustment of the mA and/or kV according to patient size and/or use of iterative reconstruction technique. CONTRAST:  37mL OMNIPAQUE IOHEXOL 300 MG/ML  SOLN COMPARISON:  11/13/2019 FINDINGS: Lower chest: There are small linear densities in the lower lung fields. There is no pleural effusion. Dense calcification is seen in the mitral annulus. Hepatobiliary: Liver measures 17.5 cm. No focal abnormality is seen. Gallbladder is contracted. There is mild diffuse wall thickening in gallbladder. Calcific density is seen in the neck of the gallbladder. Pancreas: No focal abnormality is seen. Small diverticulum is noted along the inner margin of duodenum. Spleen: Unremarkable. Adrenals/Urinary Tract: Adrenals are not enlarged. There is no hydronephrosis. Right kidney is much smaller than left with foci of cortical thinning suggesting scarring from chronic pyelonephritis. There are no renal or ureteral stones. Urinary bladder is unremarkable. Stomach/Bowel: Small hiatal hernia is seen. There is mild dilation of jejunum. Distal ileum is not dilated. Appendix is not distinctly seen. There is possible right hemicolectomy with ileocolic anastomosis in the right mid abdomen. Numerous diverticula are seen in the colon. There is wall thickening along with pericolic stranding at the junction of descending and sigmoid colon in the left iliac fossa. There is no loculated pericolic fluid  collection. Vascular/Lymphatic: Atherosclerotic plaques and calcifications are seen in the aorta and its major branches. Reproductive: Ring pessary is seen in the vaginal vault. Other: There is no ascites or pneumoperitoneum. Musculoskeletal: Unremarkable. IMPRESSION: Diverticulosis of colon. There is wall thickening along with pericolic stranding at the junction of descending and sigmoid colon in the left iliac fossa consistent with acute diverticulitis. There is no loculated pericolic abscess. There is gallbladder wall thickening along with gallbladder stones. Gallbladder is not distended. Findings may suggest chronic cholecystitis. If there is clinical suspicion for acute cholecystitis, gallbladder sonogram may be considered. There is no hydronephrosis. Right kidney is small in size with multiple foci of cortical thinning suggesting chronic pyelonephritis. Small hiatal hernia. Other findings as described in the body of the report. Electronically Signed   By: Elmer Picker M.D.   On: 12/13/2021 20:39    Microbiology: Results for orders placed or performed during the hospital encounter of 12/13/21  Resp Panel by RT-PCR (Flu A&B, Covid) Nasopharyngeal Swab     Status: None   Collection Time: 12/13/21  6:30 PM   Specimen: Nasopharyngeal Swab; Nasopharyngeal(NP) swabs in vial transport medium  Result Value Ref Range Status   SARS Coronavirus 2 by RT PCR NEGATIVE NEGATIVE Final    Comment: (NOTE) SARS-CoV-2 target nucleic acids are NOT DETECTED.  The SARS-CoV-2 RNA is generally  detectable in upper respiratory specimens during the acute phase of infection. The lowest concentration of SARS-CoV-2 viral copies this assay can detect is 138 copies/mL. A negative result does not preclude SARS-Cov-2 infection and should not be used as the sole basis for treatment or other patient management decisions. A negative result may occur with  improper specimen collection/handling, submission of specimen  other than nasopharyngeal swab, presence of viral mutation(s) within the areas targeted by this assay, and inadequate number of viral copies(<138 copies/mL). A negative result must be combined with clinical observations, patient history, and epidemiological information. The expected result is Negative.  Fact Sheet for Patients:  EntrepreneurPulse.com.au  Fact Sheet for Healthcare Providers:  IncredibleEmployment.be  This test is no t yet approved or cleared by the [place] FDA and  has been authorized for detection and/or diagnosis of SARS-CoV-2 by FDA under an Emergency Use Authorization (EUA). This EUA will remain  in effect (meaning this test can be used) for the duration of the COVID-19 declaration under Section 564(b)(1) of the Act, 21 U.S.C.section 360bbb-3(b)(1), unless the authorization is terminated  or revoked sooner.       Influenza A by PCR NEGATIVE NEGATIVE Final   Influenza B by PCR NEGATIVE NEGATIVE Final    Comment: (NOTE) The Xpert Xpress SARS-CoV-2/FLU/RSV plus assay is intended as an aid in the diagnosis of influenza from Nasopharyngeal swab specimens and should not be used as a sole basis for treatment. Nasal washings and aspirates are unacceptable for Xpert Xpress SARS-CoV-2/FLU/RSV testing.  Fact Sheet for Patients: EntrepreneurPulse.com.au  Fact Sheet for Healthcare Providers: IncredibleEmployment.be  This test is not yet approved or cleared by the [place] FDA and has been authorized for detection and/or diagnosis of SARS-CoV-2 by FDA under an Emergency Use Authorization (EUA). This EUA will remain in effect (meaning this test can be used) for the duration of the COVID-19 declaration under Section 564(b)(1) of the Act, 21 U.S.C. section 360bbb-3(b)(1), unless the authorization is terminated or revoked.  Performed at [facility], [address] [place], [place]  [address]   Blood culture (routine x 2)     Status: None (Preliminary result)   Collection Time: 12/13/21  9:42 PM   Specimen: BLOOD  Result Value Ref Range Status   Specimen Description BLOOD BLOOD LEFT HAND  Final   Special Requests   Final    BOTTLES DRAWN AEROBIC AND ANAEROBIC Blood Culture adequate volume   Culture   Final    NO GROWTH 3 DAYS Performed at [facility], [address], [place], [place] [address]    Report Status PENDING  Incomplete    Labs: CBC: Recent Labs  Lab 12/13/21 1843 12/14/21 0509 12/15/21 0540 12/16/21 0744  WBC 17.4* 17.1* 11.4* 7.4  NEUTROABS 14.9*  --   --   --   HGB 11.8* 10.5* 10.2* 11.0*  HCT 36.6 33.6* 31.7* 35.6*  MCV 98.4 98.0 99.1 98.3  PLT 361 329 314 322   Basic Metabolic Panel: Recent Labs  Lab 12/13/21 1843 12/14/21 0509 12/15/21 0540 12/16/21 0744  NA 137 138 139 139  K 4.2 4.1 3.5 3.5  CL 102 104 105 105  CO2 27 27 26 27   GLUCOSE 132* 113* 103* 93  BUN 17 15 13 10   CREATININE 0.99 0.94 1.00 0.94  CALCIUM 8.7* 8.6* 8.4* 8.8*  MG  --   --  2.3  --   PHOS  --   --  3.2  --    Liver Function  Tests: Recent Labs  Lab 12/13/21 1843  AST 12*  ALT 14  ALKPHOS 72  BILITOT 0.5  PROT 7.8  ALBUMIN 3.1*   CBG: No results for input(s): GLUCAP in the last 168 hours.  Discharge time spent: greater than 30 minutes.  Signed: Orson Eva, MD [facility] 12/16/2021

## 2021-12-16 NOTE — Hospital Course (Signed)
Cynthia [name] is a 88 y.o. female with medical history significant for dementia, hypertension, chronic cholecystitis.  Patient was brought to the ED from [facility] with reports of abdominal pain, guarding, nausea.  Work-up revealed acute diverticulitis for which she was started on Zosyn, IV morphine as needed for pain, and IV fluid hydration.   12/15/21: Saw this patient this morning but refused to be examined.  She denies having any pain.  Leukocytosis is downtrending and is afebrile.  Blood cultures negative to date.  We will switch antibiotics to oral for DC planning.   12/16/21:  Patient allowed examination.  Tolerating soft diet.  Denies any cp, abd pain, sob.  Remainder ROS not possible due to dementia.  She asks, "am I supposed to be sick."  "I'm feeling fine".  WBC improved to 7.4.

## 2021-12-18 ENCOUNTER — Telehealth: Payer: Self-pay | Admitting: *Deleted

## 2021-12-18 ENCOUNTER — Non-Acute Institutional Stay (SKILLED_NURSING_FACILITY): Payer: Medicare Other | Admitting: Adult Health

## 2021-12-18 ENCOUNTER — Encounter: Payer: Self-pay | Admitting: Adult Health

## 2021-12-18 DIAGNOSIS — K8061 Calculus of gallbladder and bile duct with cholecystitis, unspecified, with obstruction: Secondary | ICD-10-CM | POA: Diagnosis not present

## 2021-12-18 DIAGNOSIS — I10 Essential (primary) hypertension: Secondary | ICD-10-CM

## 2021-12-18 DIAGNOSIS — F0150 Vascular dementia without behavioral disturbance: Secondary | ICD-10-CM

## 2021-12-18 DIAGNOSIS — K5909 Other constipation: Secondary | ICD-10-CM

## 2021-12-18 DIAGNOSIS — B009 Herpesviral infection, unspecified: Secondary | ICD-10-CM

## 2021-12-18 DIAGNOSIS — K5792 Diverticulitis of intestine, part unspecified, without perforation or abscess without bleeding: Secondary | ICD-10-CM

## 2021-12-18 DIAGNOSIS — D649 Anemia, unspecified: Secondary | ICD-10-CM | POA: Diagnosis not present

## 2021-12-18 DIAGNOSIS — F0390 Unspecified dementia without behavioral disturbance: Secondary | ICD-10-CM

## 2021-12-18 DIAGNOSIS — I700 Atherosclerosis of aorta: Secondary | ICD-10-CM

## 2021-12-18 DIAGNOSIS — E441 Mild protein-calorie malnutrition: Secondary | ICD-10-CM | POA: Diagnosis not present

## 2021-12-18 LAB — CULTURE, BLOOD (ROUTINE X 2)
Culture: NO GROWTH
Special Requests: ADEQUATE

## 2021-12-18 NOTE — Telephone Encounter (Signed)
Transition Care Management Unsuccessful Follow-up Telephone Call  Date of discharge and from where:  12/16/2021 [facility]  Attempts:  1st Attempt  Reason for unsuccessful TCM follow-up call:  Unable to reach patient  [facility]

## 2021-12-18 NOTE — Progress Notes (Signed)
Location:  [place] Room Number: 141 Place of Service:  SNF (31) Provider;  Ok Edwards, NP   CODE STATUS: DNR  No Known Allergies  Chief Complaint  Patient presents with   Hospitalization [facility] follow up    HPI:  She is a 88 year old long term resident of this facility who has been hospitalized from 12-13-21 through 12-16-21. Her medical history includes: dementia hypertension; chronic cholecystitis. She presented to the ED for abdominal pain and fever. Her work up revealed acute diverticulitis; no loculated pericolic abscess . She was started on IV zosyn and IV morphine for pain. She has required chole drain in the past; not a surgical consult.she has been switched to augmentin for another 7 days. She is tolerating po diet. She denies any pain; no nausea; no fevers reported. She continues to be followed for her chronic illnesses including: Aortic atherosclerosis:  Major neurocognitive  disorder / vascular dementia without behavioral disturbance:  Primary hypertension:  Calculus of gall bladder without acute cholecystitis and obstruction:       Past Medical History:  Diagnosis Date   Aortic atherosclerosis ([place]) 11/15/2020   Breast cancer ([place])    Cognitive communication deficit    Dementia ([place])    Difficulty in walking(719.7)    Fall    HTN (hypertension) 08/26/2016   Muscle weakness (generalized)    Rash and nonspecific skin eruption    Thyroid disease    hypo   Urinary tract infection, site not specified    Vitamin B deficiency     Past Surgical History:  Procedure Laterality Date   COLON SURGERY     IR CATHETER TUBE CHANGE  05/07/2017   IR EXCHANGE BILIARY DRAIN  07/02/2017   IR EXCHANGE BILIARY DRAIN  07/10/2017   IR EXCHANGE BILIARY DRAIN  08/28/2017   IR EXCHANGE BILIARY DRAIN  10/11/2017   IR EXCHANGE BILIARY DRAIN  10/28/2017   IR EXCHANGE BILIARY DRAIN  12/23/2017   IR EXCHANGE BILIARY DRAIN  01/23/2018   IR EXCHANGE BILIARY  DRAIN  02/17/2018   IR EXCHANGE BILIARY DRAIN  03/20/2018   IR EXCHANGE BILIARY DRAIN  04/17/2018   IR EXCHANGE BILIARY DRAIN  05/29/2018   IR EXCHANGE BILIARY DRAIN  07/24/2018   IR EXCHANGE BILIARY DRAIN  09/22/2018   IR EXCHANGE BILIARY DRAIN  11/20/2018   IR EXCHANGE BILIARY DRAIN  02/11/2019   IR EXCHANGE BILIARY DRAIN  04/14/2019   IR EXCHANGE BILIARY DRAIN  06/09/2019   IR EXCHANGE BILIARY DRAIN  11/26/2019   IR EXCHANGE BILIARY DRAIN  12/23/2019   IR EXCHANGE BILIARY DRAIN  02/22/2020   IR EXCHANGE BILIARY DRAIN  05/25/2020   IR EXCHANGE BILIARY DRAIN  07/29/2020   IR EXCHANGE BILIARY DRAIN  08/30/2020   IR EXCHANGE BILIARY DRAIN  10/06/2020   IR EXCHANGE BILIARY DRAIN  11/18/2020   IR EXCHANGE BILIARY DRAIN  12/21/2020   IR EXCHANGE BILIARY DRAIN  02/03/2021   IR EXCHANGE BILIARY DRAIN  03/07/2021   IR EXCHANGE BILIARY DRAIN  05/04/2021   IR EXCHANGE BILIARY DRAIN  06/22/2021   IR EXCHANGE BILIARY DRAIN  07/04/2021   IR EXCHANGE BILIARY DRAIN  08/10/2021   IR PERC CHOLECYSTOSTOMY  01/27/2017   IR PERC CHOLECYSTOSTOMY  11/18/2019   IR RADIOLOGIST EVAL & MGMT  03/05/2017   IR SINUS/FIST TUBE CHK-NON GI  10/06/2020   MASTECTOMY, PARTIAL Right     Social History   Socioeconomic History  Marital status: Widowed    Spouse name: Not on file   Number of children: Not on file   Years of education: Not on file   Highest education level: Not on file  Occupational History   Not on file  Tobacco Use   Smoking status: Never   Smokeless tobacco: Never  Vaping Use   Vaping Use: Never used  Substance and Sexual Activity   Alcohol use: No   Drug use: No   Sexual activity: Not on file  Other Topics Concern   Not on file  Social History Narrative   Not on file   Social Determinants of Health   Financial Resource Strain: Not on file  Food Insecurity: Not on file  Transportation Needs: Not on file  Physical Activity: Not on file  Stress: Not on file  Social Connections: Not on file  Intimate  Partner Violence: Not on file   Family History  Problem Relation Age of Onset   Cancer Father        ? primary   Diabetes Neg Hx    Heart disease Neg Hx    Stroke Neg Hx       VITAL SIGNS BP 119/64    Pulse 73    Temp 97.8 F (36.6 C)    Ht 5\' 3"  (1.6 m)    Wt 140 lb 4.8 oz (63.6 kg)    BMI 24.85 kg/m   Outpatient Encounter Medications as of 12/18/2021  Medication Sig   amoxicillin-clavulanate (AUGMENTIN) 500-125 MG tablet Take 1 tablet (500 mg total) by mouth 2 (two) times daily. X 7 days   Balsam [place]-Castor Oil (VENELEX) OINT Apply topically. Apply to bilateral buttocks qshift for erythema.   calcium carbonate (TUMS - DOSED IN MG ELEMENTAL CALCIUM) 500 MG chewable tablet Chew 1 tablet by mouth daily.   cholecalciferol (VITAMIN D) 25 MCG (1000 UNIT) tablet Take 1,000 Units by mouth daily.   Cyanocobalamin (VITAMIN B-12) 1000 MCG SUBL Place 1 tablet under the tongue at bedtime.   donepezil (ARICEPT) 10 MG tablet Take 1 tablet by mouth at bedtime.   LORazepam (ATIVAN) 1 MG tablet Take 1 mg by mouth daily as needed for anxiety.   Nutritional Supplements (ENSURE ENLIVE PO) Take 1.5 kcal by mouth daily.   polyethylene glycol (MIRALAX / GLYCOLAX) packet Take 17 g by mouth daily as needed for mild constipation or moderate constipation.    valACYclovir (VALTREX) 500 MG tablet Take 500 mg by mouth daily. Special Instructions: HSV suppression   No facility-administered encounter medications on file as of 12/18/2021.     SIGNIFICANT DIAGNOSTIC EXAMS   TODAY  12-13-21: ct of abdomen and pelvis  Diverticulosis of colon. There is wall thickening along with pericolic stranding at the junction of descending and sigmoid colon in the left iliac fossa consistent with acute diverticulitis. There is no loculated pericolic abscess. There is gallbladder wall thickening along with gallbladder stones. Gallbladder is not distended. Findings may suggest chronic cholecystitis. If there is clinical  suspicion for acute cholecystitis, gallbladder sonogram may be considered. There is no hydronephrosis. Right kidney is small in size with multiple foci of cortical thinning suggesting chronic pyelonephritis. Small hiatal hernia.   LABS REVIEWED: PREVIOUS:   01-23-21: wbc 5.0; hgb 12.3; hct 39.3; mcv 100.0 plt 236; glucose 89; bun 16; creat 0.86; k+ 4.6; na++ 141; ca 9.0 GFR>60; liver normal albumin 3.2 mag 2.0; tsh 3.260 07-27-21: wbc 5.1; hgb 12.0; hct 36.7; mcv 101.1 plt 281; glucose 86; bun  16; creat 0.74; k+ 3.9; na++ 136; ca 8.4 GFR>60; liver normal albumin 3.0   TODAY  12-13-21: wbc 17.4;hgb 11.8; hct 36.8; mcv98.4 plt 361; glucose 132; bun 17; creat 0.77; k+ 4.2; na++ 137; ca 8.7; GFR 53 protein 7.8; albumin 3.1 blood culture: no growth 12-16-21: wbc 7.4; hgb 11.0; hct 35.6;mcv 98.3 plt 358; glucose 93; bun 10; creat 0.94; k+ 3.5; na++ 139; ca 8.8; GFR 56   Review of Systems  Constitutional:  Negative for malaise/fatigue.  Respiratory:  Negative for cough and shortness of breath.   Cardiovascular:  Negative for chest pain, palpitations and leg swelling.  Gastrointestinal:  Negative for abdominal pain, constipation and heartburn.  Musculoskeletal:  Negative for back pain, joint pain and myalgias.  Skin: Negative.   Neurological:  Negative for dizziness.  Psychiatric/Behavioral:  The patient is not nervous/anxious.     Physical Exam Constitutional:      General: She is not in acute distress.    Appearance: She is well-developed. She is not diaphoretic.  Neck:     Thyroid: No thyromegaly.  Cardiovascular:     Rate and Rhythm: Normal rate and regular rhythm.     Pulses: Normal pulses.     Heart sounds: Normal heart sounds.  Pulmonary:     Effort: Pulmonary effort is normal. No respiratory distress.     Breath sounds: Normal breath sounds.  Chest:     Comments: Right partial mastectomy  Abdominal:     General: Bowel sounds are normal.     Palpations: Abdomen is soft.      Tenderness: There is no abdominal tenderness.     Comments: Right upper quad is firm and distended  Chole drain out   Musculoskeletal:        General: Normal range of motion.     Cervical back: Neck supple.     Right lower leg: No edema.     Left lower leg: No edema.  Lymphadenopathy:     Cervical: No cervical adenopathy.  Skin:    General: Skin is warm and dry.  Neurological:     Mental Status: She is alert. Mental status is at baseline.  Psychiatric:        Mood and Affect: Mood normal.     ASSESSMENT/ PLAN:  TODAY  Acute diverticulitis: is stable will complete augmentin 500 mg twice daily for 7 days. She is tolerating po intake without difficulty.   2.  Aortic atherosclerosis: (ct 08-26-19) will monitor not on statin due to advanced age  34. Major neurocognitive  disorder / vascular dementia without behavioral disturbance: weight is 140 pounds will continue aricept 10 mg daily   4. Primary hypertension: stable b/p 119/64: will monitor   5. Calculus of gall bladder without acute cholecystitis and obstruction: chole drain is presently out will monitor   6. Chronic constipation: is stable will continue miralax daily as needed  7. Herpes: is without breakout: will continue valtrex 500 mg daily   8.  Protein calorie malnutrition: albumin 3.1 protein 7.8 will continue supplements as directed  9. Chronic anemia: hgb 11.0 will monitor       Ok Edwards NP [facility]   call [phone]

## 2021-12-19 ENCOUNTER — Non-Acute Institutional Stay (SKILLED_NURSING_FACILITY): Payer: Medicare Other | Admitting: Internal Medicine

## 2021-12-19 ENCOUNTER — Encounter: Payer: Self-pay | Admitting: Internal Medicine

## 2021-12-19 DIAGNOSIS — K5792 Diverticulitis of intestine, part unspecified, without perforation or abscess without bleeding: Secondary | ICD-10-CM

## 2021-12-19 DIAGNOSIS — F0390 Unspecified dementia without behavioral disturbance: Secondary | ICD-10-CM

## 2021-12-19 NOTE — Assessment & Plan Note (Addendum)
Today she denies any active problem other than "the pain in the neck leaning over me and drilling me with questions".  She denied having been in the hospital stating "that is news to me"

## 2021-12-19 NOTE — Patient Instructions (Signed)
See assessment and plan under each diagnosis in the problem list and acutely for this visit 

## 2021-12-19 NOTE — Assessment & Plan Note (Addendum)
Clinically asymptomatic with stable vital signs. She will complete the course of oral Augmentin here at the SNF.

## 2021-12-19 NOTE — Progress Notes (Signed)
° °  NURSING HOME LOCATION:  [facility] ROOM NUMBER:  [id number]  CODE STATUS:  DNR  PCP:  Ok Edwards NP,PSC  This is a nursing facility follow up visit for [place] readmission within 30 days   Interim medical record and care since last SNF visit was updated with review of diagnostic studies and change in clinical status since last visit were documented.  HPI: She was admitted to the hospital 2/22 - 12/16/2021, presenting from [facility] with abdominal pain, guarding, nausea and temp to 100 .White count was 17,400. Imaging revealed acute diverticulitis for which Zosyn was initiated.  She received IV morphine as needed for pain and was rehydrated with IV fluids. The acute diverticulitis is in the context of chronic cholecystitis and advanced dementia.   Bowel rest was initiated and diet gradually transitioned to clear liquids and subsequently soft diet which was tolerated. Subsequently Zosyn was de-escalated to oral Augmentin. The dementia is associated with behavioral issues which include refusal to be examined intermittently.  Review of systems: Dementia invalidated responses. Upon awakening she denied any active problems other than "the pain in the neck leaning over me drilling me with questions."  When I asked her why she had been in the hospital she states "that is news to me".  She does deny abdominal pain or any other symptoms.  Physical exam:  Pertinent or positive findings: She appears her stated age. Initially she was asleep with mouth agape.  Hypopnea was present without snoring or apnea.  Eyes are puffy.  The left nasolabial fold is slightly decreased compared to the right.  Heart sounds are distant.  Breath sounds are decreased.  Abdomen is protuberant.  Bowel sounds are decreased as well without clinical ileus.  There is yellowish discharge on her gown in the right upper quadrant .  She has trace edema at the sock line.  Interosseous wasting of the hands is  present.  There is resolving faint ecchymosis over the dorsum of the left hand.  General appearance: no acute distress, increased work of breathing is present.   Lymphatic: No lymphadenopathy about the head, neck, axilla. Eyes: No conjunctival inflammation or lid edema is present. There is no scleral icterus. Ears:  External ear exam shows no significant lesions or deformities.   Nose:  External nasal examination shows no deformity or inflammation. Nasal mucosa are pink and moist without lesions, exudates Oral exam:  There is no oropharyngeal erythema or exudate. Neck:  No thyromegaly, masses, tenderness noted.    Heart:  No gallop, murmur, click, rub .  Lungs:  without wheezes, rhonchi, rales, rubs. Abdomen: Abdomen is soft and nontender with no organomegaly, hernias, masses. GU: Deferred  Extremities:  No cyanosis, clubbing  Neurologic exam :Balance, Rhomberg, finger to nose testing could not be completed due to clinical state Skin: Warm & dry w/o tenting. No significant rash.  See summary under each active problem in the Problem List with associated updated therapeutic plan

## 2021-12-20 DIAGNOSIS — F01C0 Vascular dementia, severe, without behavioral disturbance, psychotic disturbance, mood disturbance, and anxiety: Secondary | ICD-10-CM | POA: Insufficient documentation

## 2021-12-20 DIAGNOSIS — F0150 Vascular dementia without behavioral disturbance: Secondary | ICD-10-CM | POA: Insufficient documentation

## 2021-12-20 DIAGNOSIS — E441 Mild protein-calorie malnutrition: Secondary | ICD-10-CM | POA: Insufficient documentation

## 2021-12-20 DIAGNOSIS — D649 Anemia, unspecified: Secondary | ICD-10-CM | POA: Insufficient documentation

## 2021-12-21 DIAGNOSIS — Z1159 Encounter for screening for other viral diseases: Secondary | ICD-10-CM | POA: Diagnosis not present

## 2021-12-26 ENCOUNTER — Other Ambulatory Visit (HOSPITAL_COMMUNITY)
Admission: RE | Admit: 2021-12-26 | Discharge: 2021-12-26 | Disposition: A | Discharge status: Home / Self Care | Payer: Medicare Other | Source: Skilled Nursing Facility | Attending: Adult Health | Admitting: Adult Health

## 2021-12-26 DIAGNOSIS — I10 Essential (primary) hypertension: Secondary | ICD-10-CM | POA: Diagnosis not present

## 2021-12-26 LAB — BASIC METABOLIC PANEL WITH GFR
Anion gap: 9 (ref 5–15)
BUN: 15 mg/dL (ref 8–23)
CO2: 28 mmol/L (ref 22–32)
Calcium: 9.2 mg/dL (ref 8.9–10.3)
Chloride: 102 mmol/L (ref 98–111)
Creatinine, Ser: 0.88 mg/dL (ref 0.44–1.00)
GFR, Estimated: >60 mL/min
Glucose, Bld: 80 mg/dL (ref 70–99)
Potassium: 4 mmol/L (ref 3.5–5.1)
Sodium: 139 mmol/L (ref 135–145)

## 2021-12-26 LAB — CBC
HCT: 39.1 % (ref 36.0–46.0)
Hemoglobin: 12.6 g/dL (ref 12.0–15.0)
MCH: 32.1 pg (ref 26.0–34.0)
MCHC: 32.2 g/dL (ref 30.0–36.0)
MCV: 99.5 fL (ref 80.0–100.0)
Platelets: 397 10*3/uL (ref 150–400)
RBC: 3.93 MIL/uL (ref 3.87–5.11)
RDW: 16.7 % — ABNORMAL HIGH (ref 11.5–15.5)
WBC: 6.4 10*3/uL (ref 4.0–10.5)
nRBC: 0 % (ref 0.0–0.2)

## 2022-01-17 ENCOUNTER — Encounter: Payer: Self-pay | Admitting: Adult Health

## 2022-01-17 ENCOUNTER — Non-Acute Institutional Stay (SKILLED_NURSING_FACILITY): Payer: Medicare Other | Admitting: Adult Health

## 2022-01-17 DIAGNOSIS — F0390 Unspecified dementia without behavioral disturbance: Secondary | ICD-10-CM

## 2022-01-17 DIAGNOSIS — E441 Mild protein-calorie malnutrition: Secondary | ICD-10-CM

## 2022-01-17 DIAGNOSIS — D649 Anemia, unspecified: Secondary | ICD-10-CM

## 2022-01-17 DIAGNOSIS — I10 Essential (primary) hypertension: Secondary | ICD-10-CM | POA: Diagnosis not present

## 2022-01-17 DIAGNOSIS — K5909 Other constipation: Secondary | ICD-10-CM | POA: Diagnosis not present

## 2022-01-17 DIAGNOSIS — F0150 Vascular dementia without behavioral disturbance: Secondary | ICD-10-CM

## 2022-01-17 DIAGNOSIS — K8061 Calculus of gallbladder and bile duct with cholecystitis, unspecified, with obstruction: Secondary | ICD-10-CM

## 2022-01-17 DIAGNOSIS — I700 Atherosclerosis of aorta: Secondary | ICD-10-CM | POA: Diagnosis not present

## 2022-01-17 DIAGNOSIS — Z853 Personal history of malignant neoplasm of breast: Secondary | ICD-10-CM

## 2022-01-19 NOTE — Progress Notes (Signed)
? ?Provider:Noheli Melder np  ?[facility]  ? ?PCP: [name], NP ? ?Extended Emergency Contact Information ?Primary Emergency Contact: Cynthia [name],Cynthia [name] ?Mobile Phone: [phone] ?Relation: Daughter ?Secondary Emergency Contact: FACILITY,FACILITY ? [place] ?Home Phone: [phone] ?Relation: Other ? ?Codes status: dnr  ?Goals of care: advanced directive information ? ?  01/17/2022  ?  9:36 AM  ?Advanced Directives  ?Does Patient Have a Medical Advance Directive? Yes  ?Type of Advance Directive Out of facility DNR (pink MOST or yellow form)  ?Does patient want to make changes to medical advance directive? No - Patient declined  ?Pre-existing out of facility DNR order (yellow form or pink MOST form) Yellow form placed in chart (order not valid for inpatient use)  ? ? ? ?No Known Allergies ? ?Chief Complaint  ?Patient presents with  ? Annual Exam  ? ? ?HPI ? ?She is a 88 year old long term resident of this facility being seen for her annual exam. She has had one hospitalization she has been hospitalized for acute diverticulitis last month; has not had any further complications. No recent falls. Her weight is stable. There are no reports of uncontrolled pain. She continues to be followed for her chronic illnesses including: Aortic atherosclerosis  Major neurocognitive disorder/vascular dementia without behavioral disturbance: Primary hypertension: Calculus of gall bladder without acute cholecystitis and obstruction; ? ? ?Past Medical History:  ?Diagnosis Date  ? Aortic atherosclerosis ([place]) 11/15/2020  ? Breast cancer ([place])   ? Cognitive communication deficit   ? Dementia ([place])   ? Difficulty in walking(719.7)   ? Fall   ? HTN (hypertension) 08/26/2016  ? Muscle weakness (generalized)   ? Rash and nonspecific skin eruption   ? Thyroid disease   ? hypo  ? Urinary tract infection, site not specified   ? Vitamin B deficiency   ? ?Past Surgical History:  ?Procedure Laterality Date   ? COLON SURGERY    ? IR CATHETER TUBE CHANGE  05/07/2017  ? IR EXCHANGE BILIARY DRAIN  07/02/2017  ? IR EXCHANGE BILIARY DRAIN  07/10/2017  ? IR EXCHANGE BILIARY DRAIN  08/28/2017  ? IR EXCHANGE BILIARY DRAIN  10/11/2017  ? IR EXCHANGE BILIARY DRAIN  10/28/2017  ? IR EXCHANGE BILIARY DRAIN  12/23/2017  ? IR EXCHANGE BILIARY DRAIN  01/23/2018  ? IR EXCHANGE BILIARY DRAIN  02/17/2018  ? IR EXCHANGE BILIARY DRAIN  03/20/2018  ? IR EXCHANGE BILIARY DRAIN  04/17/2018  ? IR EXCHANGE BILIARY DRAIN  05/29/2018  ? IR EXCHANGE BILIARY DRAIN  07/24/2018  ? IR EXCHANGE BILIARY DRAIN  09/22/2018  ? IR EXCHANGE BILIARY DRAIN  11/20/2018  ? IR EXCHANGE BILIARY DRAIN  02/11/2019  ? IR EXCHANGE BILIARY DRAIN  04/14/2019  ? IR EXCHANGE BILIARY DRAIN  06/09/2019  ? IR EXCHANGE BILIARY DRAIN  11/26/2019  ? IR EXCHANGE BILIARY DRAIN  12/23/2019  ? IR EXCHANGE BILIARY DRAIN  02/22/2020  ? IR EXCHANGE BILIARY DRAIN  05/25/2020  ? IR EXCHANGE BILIARY DRAIN  07/29/2020  ? IR EXCHANGE BILIARY DRAIN  08/30/2020  ? IR EXCHANGE BILIARY DRAIN  10/06/2020  ? IR EXCHANGE BILIARY DRAIN  11/18/2020  ? IR EXCHANGE BILIARY DRAIN  12/21/2020  ? IR EXCHANGE BILIARY DRAIN  02/03/2021  ? IR EXCHANGE BILIARY DRAIN  03/07/2021  ? IR EXCHANGE BILIARY DRAIN  05/04/2021  ? IR EXCHANGE BILIARY DRAIN  06/22/2021  ? IR EXCHANGE BILIARY DRAIN  07/04/2021  ? IR EXCHANGE BILIARY DRAIN  08/10/2021  ?  IR PERC CHOLECYSTOSTOMY  01/27/2017  ? IR PERC CHOLECYSTOSTOMY  11/18/2019  ? IR RADIOLOGIST EVAL & MGMT  03/05/2017  ? IR SINUS/FIST TUBE CHK-NON GI  10/06/2020  ? MASTECTOMY, PARTIAL Right   ? ? reports that she has never smoked. She has never used smokeless tobacco. She reports that she does not drink alcohol and does not use drugs. ?Social History  ? ?Tobacco Use  ? Smoking status: Never  ? Smokeless tobacco: Never  ?Vaping Use  ? Vaping Use: Never used  ?Substance Use Topics  ? Alcohol use: No  ? Drug use: No  ? ?Family History  ?Problem Relation Age of Onset  ? Cancer Father   ?     ? primary  ? Diabetes  Neg Hx   ? Heart disease Neg Hx   ? Stroke Neg Hx   ? ? ?Pertinent  Health Maintenance Due  ?Topic Date Due  ? INFLUENZA VACCINE  Completed  ? DEXA SCAN  Completed  ? ? ?  12/14/2021  ?  7:56 AM 12/14/2021  ? 11:00 PM 12/15/2021  ?  7:39 AM 12/15/2021  ?  7:44 PM 12/16/2021  ?  7:48 AM  ?Fall Risk  ?Patient Fall Risk Level High fall risk High fall risk High fall risk High fall risk High fall risk  ? ? ?  11/10/2021  ?  2:05 PM 10/30/2021  ?  3:30 PM 06/12/2021  ?  3:07 PM 06/06/2020  ? 11:20 AM 09/29/2019  ?  1:26 PM  ?Depression screen PHQ 2/9  ?Decreased Interest 0 0 0 0 0  ?Down, Depressed, Hopeless 0 0 0 0 0  ?PHQ - 2 Score 0 0 0 0 0  ?Altered sleeping 0      ?Tired, decreased energy 0      ?Change in appetite 0      ?Feeling bad or failure about yourself  0      ?Trouble concentrating 0      ?Moving slowly or fidgety/restless 0      ?Suicidal thoughts 0      ?PHQ-9 Score 0      ? ? ?Functional Status Survey: ?  ? ?Outpatient Encounter Medications as of 01/17/2022  ?Medication Sig  ? Balsam [place]-Castor Oil (VENELEX) OINT Apply topically. Apply to bilateral buttocks qshift for erythema.  ? calcium carbonate (TUMS - DOSED IN MG ELEMENTAL CALCIUM) 500 MG chewable tablet Chew 1 tablet by mouth daily.  ? cholecalciferol (VITAMIN D) 25 MCG (1000 UNIT) tablet Take 1,000 Units by mouth daily.  ? Cyanocobalamin (VITAMIN B-12) 1000 MCG SUBL Place 1 tablet under the tongue at bedtime.  ? donepezil (ARICEPT) 10 MG tablet Take 1 tablet by mouth at bedtime.  ? LORazepam (ATIVAN) 1 MG tablet Take 1 mg by mouth daily as needed for anxiety.  ? Nutritional Supplements (ENSURE ENLIVE PO) Take 1.5 kcal by mouth daily.  ? polyethylene glycol (MIRALAX / GLYCOLAX) packet Take 17 g by mouth daily as needed for mild constipation or moderate constipation.   ? valACYclovir (VALTREX) 500 MG tablet Take 500 mg by mouth daily. Special Instructions: HSV suppression  ? [DISCONTINUED] amoxicillin-clavulanate (AUGMENTIN) 500-125 MG tablet Take 1 tablet (500  mg total) by mouth 2 (two) times daily. X 7 days  ? ?No facility-administered encounter medications on file as of 01/17/2022.  ? ? ? ?Vitals:  ? 01/17/22 0931  ?BP: (!) 152/49  ?Pulse: 76  ?Resp: 20  ?Temp: (!) 97.2 ?F (36.2 ?C)  ?SpO2:  93%  ?Weight: 136 lb 9.6 oz (62 kg)  ?Height: '5\' 3"'$  (1.6 m)  ? ?Body mass index is 24.2 kg/m?. ? ?DIAGNOSTIC EXAMS ? ?PREVIOUS  ? ?12-13-21: ct of abdomen and pelvis  ?Diverticulosis of colon. There is wall thickening along with pericolic stranding at the junction of descending and sigmoid colon in the left iliac fossa consistent with acute diverticulitis. There is no loculated pericolic abscess. ?There is gallbladder wall thickening along with gallbladder stones. Gallbladder is not distended. Findings may suggest chronic cholecystitis. If there is clinical suspicion for acute cholecystitis, gallbladder sonogram may be considered. ?There is no hydronephrosis. Right kidney is small in size with multiple foci of cortical thinning suggesting chronic pyelonephritis. Small hiatal hernia. ? ?NO NEW EXAMS  ? ? ?LABS REVIEWED: PREVIOUS:  ? ?01-23-21: wbc 5.0; hgb 12.3; hct 39.3; mcv 100.0 plt 236; glucose 89; bun 16; creat 0.86; k+ 4.6; na++ 141; ca 9.0 GFR>60; liver normal albumin 3.2 mag 2.0; tsh 3.260 ?07-27-21: wbc 5.1; hgb 12.0; hct 36.7; mcv 101.1 plt 281; glucose 86; bun 16; creat 0.74; k+ 3.9; na++ 136; ca 8.4 GFR>60; liver normal albumin 3.0  ?12-13-21: wbc 17.4;hgb 11.8; hct 36.8; mcv98.4 plt 361; glucose 132; bun 17; creat 0.77; k+ 4.2; na++ 137; ca 8.7; GFR 53 protein 7.8; albumin 3.1 blood culture: no growth ?12-16-21: wbc 7.4; hgb 11.0; hct 35.6;mcv 98.3 plt 358; glucose 93; bun 10; creat 0.94; k+ 3.5; na++ 139; ca 8.8; GFR 56  ? ?TODAY ? ?12-26-21: wbc 6.4; hgb 12.4; hct 39.1; mcv 99.5 plt 397; glucose 80; bun 15; creat 0.88; k+ 4.0; na++ 139; ca 9.2; GFR>60   ? ? ?Review of Systems  ?Constitutional:  Negative for malaise/fatigue.  ?Respiratory:  Negative for cough and shortness of  breath.   ?Cardiovascular:  Negative for chest pain, palpitations and leg swelling.  ?Gastrointestinal:  Negative for abdominal pain, constipation and heartburn.  ?Musculoskeletal:  Negative for back pai

## 2022-01-28 DIAGNOSIS — Z1159 Encounter for screening for other viral diseases: Secondary | ICD-10-CM | POA: Diagnosis not present

## 2022-02-08 ENCOUNTER — Non-Acute Institutional Stay (SKILLED_NURSING_FACILITY): Payer: Medicare Other | Admitting: Adult Health

## 2022-02-08 ENCOUNTER — Encounter: Payer: Self-pay | Admitting: Adult Health

## 2022-02-08 DIAGNOSIS — K8061 Calculus of gallbladder and bile duct with cholecystitis, unspecified, with obstruction: Secondary | ICD-10-CM | POA: Diagnosis not present

## 2022-02-08 DIAGNOSIS — F0390 Unspecified dementia without behavioral disturbance: Secondary | ICD-10-CM | POA: Diagnosis not present

## 2022-02-08 DIAGNOSIS — F0150 Vascular dementia without behavioral disturbance: Secondary | ICD-10-CM

## 2022-02-08 DIAGNOSIS — I10 Essential (primary) hypertension: Secondary | ICD-10-CM

## 2022-02-08 DIAGNOSIS — I700 Atherosclerosis of aorta: Secondary | ICD-10-CM | POA: Diagnosis not present

## 2022-02-08 NOTE — Progress Notes (Signed)
?Location:  [place] ?Nursing Home Room Number: 141-D ?Place of Service:  SNF (31) ? ? ?CODE STATUS: DNR ? ?No Known Allergies ? ?Chief Complaint  ?Patient presents with  ? Medical Management of Chronic Issues ? ?                  Aortic atherosclerosis  Major neurocognitive disorder/vascular dementia without behavioral disturbance: .Primary hypertension:  Calculus of gall bladder without acute cholecystitis and obstruction  ? ? ?HPI: ? ?She is a 88 year old long term resident of this facility being seen for the management of her chronic illnesses:  Aortic atherosclerosis  Major neurocognitive disorder/vascular dementia without behavioral disturbance: .Primary hypertension:  Calculus of gall bladder without acute cholecystitis and obstruction. There are no reports of uncontrolled pain. Her weight is stable. No reports of anxiety or agitation.  ? ?Past Medical History:  ?Diagnosis Date  ? Aortic atherosclerosis ([place]) 11/15/2020  ? Breast cancer ([place])   ? Cognitive communication deficit   ? Dementia ([place])   ? Difficulty in walking(719.7)   ? Fall   ? HTN (hypertension) 08/26/2016  ? Muscle weakness (generalized)   ? Rash and nonspecific skin eruption   ? Thyroid disease   ? hypo  ? Urinary tract infection, site not specified   ? Vitamin B deficiency   ? ? ?Past Surgical History:  ?Procedure Laterality Date  ? COLON SURGERY    ? IR CATHETER TUBE CHANGE  05/07/2017  ? IR EXCHANGE BILIARY DRAIN  07/02/2017  ? IR EXCHANGE BILIARY DRAIN  07/10/2017  ? IR EXCHANGE BILIARY DRAIN  08/28/2017  ? IR EXCHANGE BILIARY DRAIN  10/11/2017  ? IR EXCHANGE BILIARY DRAIN  10/28/2017  ? IR EXCHANGE BILIARY DRAIN  12/23/2017  ? IR EXCHANGE BILIARY DRAIN  01/23/2018  ? IR EXCHANGE BILIARY DRAIN  02/17/2018  ? IR EXCHANGE BILIARY DRAIN  03/20/2018  ? IR EXCHANGE BILIARY DRAIN  04/17/2018  ? IR EXCHANGE BILIARY DRAIN  05/29/2018  ? IR EXCHANGE BILIARY DRAIN  07/24/2018  ? IR EXCHANGE BILIARY DRAIN  09/22/2018  ? IR EXCHANGE BILIARY DRAIN  11/20/2018   ? IR EXCHANGE BILIARY DRAIN  02/11/2019  ? IR EXCHANGE BILIARY DRAIN  04/14/2019  ? IR EXCHANGE BILIARY DRAIN  06/09/2019  ? IR EXCHANGE BILIARY DRAIN  11/26/2019  ? IR EXCHANGE BILIARY DRAIN  12/23/2019  ? IR EXCHANGE BILIARY DRAIN  02/22/2020  ? IR EXCHANGE BILIARY DRAIN  05/25/2020  ? IR EXCHANGE BILIARY DRAIN  07/29/2020  ? IR EXCHANGE BILIARY DRAIN  08/30/2020  ? IR EXCHANGE BILIARY DRAIN  10/06/2020  ? IR EXCHANGE BILIARY DRAIN  11/18/2020  ? IR EXCHANGE BILIARY DRAIN  12/21/2020  ? IR EXCHANGE BILIARY DRAIN  02/03/2021  ? IR EXCHANGE BILIARY DRAIN  03/07/2021  ? IR EXCHANGE BILIARY DRAIN  05/04/2021  ? IR EXCHANGE BILIARY DRAIN  06/22/2021  ? IR EXCHANGE BILIARY DRAIN  07/04/2021  ? IR EXCHANGE BILIARY DRAIN  08/10/2021  ? IR PERC CHOLECYSTOSTOMY  01/27/2017  ? IR PERC CHOLECYSTOSTOMY  11/18/2019  ? IR RADIOLOGIST EVAL & MGMT  03/05/2017  ? IR SINUS/FIST TUBE CHK-NON GI  10/06/2020  ? MASTECTOMY, PARTIAL Right   ? ? ?Social History  ? ?Socioeconomic History  ? Marital status: Widowed  ?  Spouse name: Not on file  ? Number of children: Not on file  ? Years of education: Not on file  ? Highest education level: Not on file  ?Occupational History  ? Not  on file  ?Tobacco Use  ? Smoking status: Never  ? Smokeless tobacco: Never  ?Vaping Use  ? Vaping Use: Never used  ?Substance and Sexual Activity  ? Alcohol use: No  ? Drug use: No  ? Sexual activity: Not on file  ?Other Topics Concern  ? Not on file  ?Social History Narrative  ? Not on file  ? ?Social Determinants of Health  ? ?Financial Resource Strain: Not on file  ?Food Insecurity: Not on file  ?Transportation Needs: Not on file  ?Physical Activity: Not on file  ?Stress: Not on file  ?Social Connections: Not on file  ?Intimate Partner Violence: Not on file  ? ?Family History  ?Problem Relation Age of Onset  ? Cancer Father   ?     ? primary  ? Diabetes Neg Hx   ? Heart disease Neg Hx   ? Stroke Neg Hx   ? ? ? ? ?VITAL SIGNS ?BP 128/76   Pulse 78   Temp (!) 97.2 ?F (36.2 ?C)   Resp  20   Ht '5\' 3"'$  (1.6 m)   Wt 136 lb 9.6 oz (62 kg)   BMI 24.20 kg/m?  ? ?Outpatient Encounter Medications as of 02/08/2022  ?Medication Sig  ? Balsam [place]-Castor Oil (VENELEX) OINT Apply topically. Apply to bilateral buttocks qshift for erythema.  ? calcium carbonate (TUMS - DOSED IN MG ELEMENTAL CALCIUM) 500 MG chewable tablet Chew 1 tablet by mouth daily.  ? cholecalciferol (VITAMIN D) 25 MCG (1000 UNIT) tablet Take 1,000 Units by mouth daily.  ? Cyanocobalamin (VITAMIN B-12) 1000 MCG SUBL Place 1 tablet under the tongue at bedtime.  ? donepezil (ARICEPT) 10 MG tablet Take 1 tablet by mouth at bedtime.  ? LORazepam (ATIVAN) 1 MG tablet Take 1 mg by mouth daily as needed for anxiety.  ? NON FORMULARY Diet: Dysphagia 3 thin liquids  ? Nutritional Supplements (ENSURE ENLIVE PO) Take 1.5 kcal by mouth daily.  ? polyethylene glycol (MIRALAX / GLYCOLAX) packet Take 17 g by mouth daily as needed for mild constipation or moderate constipation.   ? valACYclovir (VALTREX) 500 MG tablet Take 500 mg by mouth daily. Special Instructions: HSV suppression  ? ?No facility-administered encounter medications on file as of 02/08/2022.  ? ? ? ?SIGNIFICANT DIAGNOSTIC EXAMS ? ? ?DIAGNOSTIC EXAMS ? ?PREVIOUS  ? ?12-13-21: ct of abdomen and pelvis  ?Diverticulosis of colon. There is wall thickening along with pericolic stranding at the junction of descending and sigmoid colon in the left iliac fossa consistent with acute diverticulitis. There is no loculated pericolic abscess. ?There is gallbladder wall thickening along with gallbladder stones. Gallbladder is not distended. Findings may suggest chronic cholecystitis. If there is clinical suspicion for acute cholecystitis, gallbladder sonogram may be considered. ?There is no hydronephrosis. Right kidney is small in size with multiple foci of cortical thinning suggesting chronic pyelonephritis. Small hiatal hernia. ? ?NO NEW EXAMS  ? ? ?LABS REVIEWED: PREVIOUS:  ? ?01-23-21: wbc 5.0; hgb 12.3;  hct 39.3; mcv 100.0 plt 236; glucose 89; bun 16; creat 0.86; k+ 4.6; na++ 141; ca 9.0 GFR>60; liver normal albumin 3.2 mag 2.0; tsh 3.260 ?07-27-21: wbc 5.1; hgb 12.0; hct 36.7; mcv 101.1 plt 281; glucose 86; bun 16; creat 0.74; k+ 3.9; na++ 136; ca 8.4 GFR>60; liver normal albumin 3.0  ?12-13-21: wbc 17.4;hgb 11.8; hct 36.8; mcv98.4 plt 361; glucose 132; bun 17; creat 0.77; k+ 4.2; na++ 137; ca 8.7; GFR 53 protein 7.8; albumin 3.1 blood culture: no  growth ?12-16-21: wbc 7.4; hgb 11.0; hct 35.6;mcv 98.3 plt 358; glucose 93; bun 10; creat 0.94; k+ 3.5; na++ 139; ca 8.8; GFR 56  ?12-26-21: wbc 6.4; hgb 12.4; hct 39.1; mcv 99.5 plt 397; glucose 80; bun 15; creat 0.88; k+ 4.0; na++ 139; ca 9.2; GFR>60  ? ?NO NEW LABS.   ? ? ?Review of Systems  ?Constitutional:  Negative for malaise/fatigue.  ?Respiratory:  Negative for cough and shortness of breath.   ?Cardiovascular:  Negative for chest pain, palpitations and leg swelling.  ?Gastrointestinal:  Negative for abdominal pain, constipation and heartburn.  ?Musculoskeletal:  Negative for back pain, joint pain and myalgias.  ?Skin: Negative.   ?Neurological:  Negative for dizziness.  ?Psychiatric/Behavioral:  The patient is not nervous/anxious.   ? ?Physical Exam ?Constitutional:   ?   General: She is not in acute distress. ?   Appearance: She is well-developed. She is not diaphoretic.  ?Neck:  ?   Thyroid: No thyromegaly.  ?Cardiovascular:  ?   Rate and Rhythm: Normal rate and regular rhythm.  ?   Pulses: Normal pulses.  ?   Heart sounds: Normal heart sounds.  ?Pulmonary:  ?   Effort: Pulmonary effort is normal. No respiratory distress.  ?   Breath sounds: Normal breath sounds.  ?Chest:  ?   Comments: Right partial mastectomy ?Abdominal:  ?   General: Bowel sounds are normal. There is no distension.  ?   Palpations: Abdomen is soft.  ?   Tenderness: There is no abdominal tenderness.  ?   Comments: Chole drain out    ?Musculoskeletal:     ?   General: Normal range of motion.  ?    Cervical back: Neck supple.  ?   Right lower leg: No edema.  ?   Left lower leg: No edema.  ?Lymphadenopathy:  ?   Cervical: No cervical adenopathy.  ?Skin: ?   General: Skin is warm and dry.  ?Neurologic

## 2022-02-09 ENCOUNTER — Encounter: Payer: Self-pay | Admitting: Adult Health

## 2022-02-09 ENCOUNTER — Non-Acute Institutional Stay (SKILLED_NURSING_FACILITY): Payer: Medicare Other | Admitting: Adult Health

## 2022-02-09 DIAGNOSIS — F0150 Vascular dementia without behavioral disturbance: Secondary | ICD-10-CM

## 2022-02-09 DIAGNOSIS — F0390 Unspecified dementia without behavioral disturbance: Secondary | ICD-10-CM | POA: Diagnosis not present

## 2022-02-09 DIAGNOSIS — I700 Atherosclerosis of aorta: Secondary | ICD-10-CM | POA: Diagnosis not present

## 2022-02-09 NOTE — Progress Notes (Signed)
?Location:  [facility] ?Nursing Home Room Number: 141-D ?Place of Service:  SNF (31) ? ? ?CODE STATUS: DNR ? ?No Known Allergies ? ?Chief Complaint  ?Patient presents with  ? Acute Visit  ?  Care plan meeting  ? ? ?HPI: ? ?We have come together for her care plan meeting. BIMS 5/15 mood 0/30.  She is independent to limited assist with her adls. She is frequently incontinent of bladder and bowel. No falls. Dietary: regular diet; appetite is fair; feeds self; weight is 136.6 pounds. Less participation in activities Therapy: none at this time. She continues to be followed for her chronic illnesses including: Aortic atherosclerosis Vascular dementia without behavioral disturbance  Major neurocognitive disorder ? ?Past Medical History:  ?Diagnosis Date  ? Aortic atherosclerosis ([place]) 11/15/2020  ? Breast cancer ([place])   ? Cognitive communication deficit   ? Dementia ([address])   ? Difficulty in walking(719.7)   ? Fall   ? HTN (hypertension) 08/26/2016  ? Muscle weakness (generalized)   ? Rash and nonspecific skin eruption   ? Thyroid disease   ? hypo  ? Urinary tract infection, site not specified   ? Vitamin B deficiency   ? ? ?Past Surgical History:  ?Procedure Laterality Date  ? COLON SURGERY    ? IR CATHETER TUBE CHANGE  05/07/2017  ? IR EXCHANGE BILIARY DRAIN  07/02/2017  ? IR EXCHANGE BILIARY DRAIN  07/10/2017  ? IR EXCHANGE BILIARY DRAIN  08/28/2017  ? IR EXCHANGE BILIARY DRAIN  10/11/2017  ? IR EXCHANGE BILIARY DRAIN  10/28/2017  ? IR EXCHANGE BILIARY DRAIN  12/23/2017  ? IR EXCHANGE BILIARY DRAIN  01/23/2018  ? IR EXCHANGE BILIARY DRAIN  02/17/2018  ? IR EXCHANGE BILIARY DRAIN  03/20/2018  ? IR EXCHANGE BILIARY DRAIN  04/17/2018  ? IR EXCHANGE BILIARY DRAIN  05/29/2018  ? IR EXCHANGE BILIARY DRAIN  07/24/2018  ? IR EXCHANGE BILIARY DRAIN  09/22/2018  ? IR EXCHANGE BILIARY DRAIN  11/20/2018  ? IR EXCHANGE BILIARY DRAIN  02/11/2019  ? IR EXCHANGE BILIARY DRAIN  04/14/2019  ? IR EXCHANGE BILIARY DRAIN  06/09/2019  ? IR EXCHANGE BILIARY  DRAIN  11/26/2019  ? IR EXCHANGE BILIARY DRAIN  12/23/2019  ? IR EXCHANGE BILIARY DRAIN  02/22/2020  ? IR EXCHANGE BILIARY DRAIN  05/25/2020  ? IR EXCHANGE BILIARY DRAIN  07/29/2020  ? IR EXCHANGE BILIARY DRAIN  08/30/2020  ? IR EXCHANGE BILIARY DRAIN  10/06/2020  ? IR EXCHANGE BILIARY DRAIN  11/18/2020  ? IR EXCHANGE BILIARY DRAIN  12/21/2020  ? IR EXCHANGE BILIARY DRAIN  02/03/2021  ? IR EXCHANGE BILIARY DRAIN  03/07/2021  ? IR EXCHANGE BILIARY DRAIN  05/04/2021  ? IR EXCHANGE BILIARY DRAIN  06/22/2021  ? IR EXCHANGE BILIARY DRAIN  07/04/2021  ? IR EXCHANGE BILIARY DRAIN  08/10/2021  ? IR PERC CHOLECYSTOSTOMY  01/27/2017  ? IR PERC CHOLECYSTOSTOMY  11/18/2019  ? IR RADIOLOGIST EVAL & MGMT  03/05/2017  ? IR SINUS/FIST TUBE CHK-NON GI  10/06/2020  ? MASTECTOMY, PARTIAL Right   ? ? ?Social History  ? ?Socioeconomic History  ? Marital status: Widowed  ?  Spouse name: Not on file  ? Number of children: Not on file  ? Years of education: Not on file  ? Highest education level: Not on file  ?Occupational History  ? Not on file  ?Tobacco Use  ? Smoking status: Never  ? Smokeless tobacco: Never  ?Vaping Use  ? Vaping Use: Never used  ?  Substance and Sexual Activity  ? Alcohol use: No  ? Drug use: No  ? Sexual activity: Not on file  ?Other Topics Concern  ? Not on file  ?Social History Narrative  ? Not on file  ? ?Social Determinants of Health  ? ?Financial Resource Strain: Not on file  ?Food Insecurity: Not on file  ?Transportation Needs: Not on file  ?Physical Activity: Not on file  ?Stress: Not on file  ?Social Connections: Not on file  ?Intimate Partner Violence: Not on file  ? ?Family History  ?Problem Relation Age of Onset  ? Cancer Father   ?     ? primary  ? Diabetes Neg Hx   ? Heart disease Neg Hx   ? Stroke Neg Hx   ? ? ? ? ?VITAL SIGNS ?BP 128/76   Pulse 78   Temp (!) 97.2 ?F (36.2 ?C)   Resp 20   Ht '5\' 3"'$  (1.6 m)   Wt 134 lb 12.8 oz (61.1 kg)   BMI 23.88 kg/m?  ? ?Outpatient Encounter Medications as of 02/09/2022  ?Medication Sig   ? Balsam [place]-Castor Oil (VENELEX) OINT Apply topically. Apply to bilateral buttocks qshift for erythema.  ? calcium carbonate (TUMS - DOSED IN MG ELEMENTAL CALCIUM) 500 MG chewable tablet Chew 1 tablet by mouth daily.  ? cholecalciferol (VITAMIN D) 25 MCG (1000 UNIT) tablet Take 1,000 Units by mouth daily.  ? Cyanocobalamin (VITAMIN B-12) 1000 MCG SUBL Place 1 tablet under the tongue at bedtime.  ? donepezil (ARICEPT) 10 MG tablet Take 1 tablet by mouth at bedtime.  ? LORazepam (ATIVAN) 1 MG tablet Take 1 mg by mouth daily as needed for anxiety.  ? NON FORMULARY Diet: Dysphagia 3 thin liquids  ? Nutritional Supplements (ENSURE ENLIVE PO) Take 1.5 kcal by mouth daily.  ? polyethylene glycol (MIRALAX / GLYCOLAX) packet Take 17 g by mouth daily as needed for mild constipation or moderate constipation.   ? valACYclovir (VALTREX) 500 MG tablet Take 500 mg by mouth daily. Special Instructions: HSV suppression  ? ?No facility-administered encounter medications on file as of 02/09/2022.  ? ? ? ?SIGNIFICANT DIAGNOSTIC EXAMS ? ? ?PREVIOUS  ? ?12-13-21: ct of abdomen and pelvis  ?Diverticulosis of colon. There is wall thickening along with pericolic stranding at the junction of descending and sigmoid colon in the left iliac fossa consistent with acute diverticulitis. There is no loculated pericolic abscess. ?There is gallbladder wall thickening along with gallbladder stones. Gallbladder is not distended. Findings may suggest chronic cholecystitis. If there is clinical suspicion for acute cholecystitis, gallbladder sonogram may be considered. ?There is no hydronephrosis. Right kidney is small in size with multiple foci of cortical thinning suggesting chronic pyelonephritis. Small hiatal hernia. ? ?NO NEW EXAMS  ? ? ?LABS REVIEWED: PREVIOUS:  ? ?01-23-21: wbc 5.0; hgb 12.3; hct 39.3; mcv 100.0 plt 236; glucose 89; bun 16; creat 0.86; k+ 4.6; na++ 141; ca 9.0 GFR>60; liver normal albumin 3.2 mag 2.0; tsh 3.260 ?07-27-21: wbc 5.1;  hgb 12.0; hct 36.7; mcv 101.1 plt 281; glucose 86; bun 16; creat 0.74; k+ 3.9; na++ 136; ca 8.4 GFR>60; liver normal albumin 3.0  ?12-13-21: wbc 17.4;hgb 11.8; hct 36.8; mcv98.4 plt 361; glucose 132; bun 17; creat 0.77; k+ 4.2; na++ 137; ca 8.7; GFR 53 protein 7.8; albumin 3.1 blood culture: no growth ?12-16-21: wbc 7.4; hgb 11.0; hct 35.6;mcv 98.3 plt 358; glucose 93; bun 10; creat 0.94; k+ 3.5; na++ 139; ca 8.8; GFR 56  ?12-26-21: wbc  6.4; hgb 12.4; hct 39.1; mcv 99.5 plt 397; glucose 80; bun 15; creat 0.88; k+ 4.0; na++ 139; ca 9.2; GFR>60  ? ?NO NEW LABS.   ? ?Review of Systems  ?Constitutional:  Negative for malaise/fatigue.  ?Respiratory:  Negative for cough and shortness of breath.   ?Cardiovascular:  Negative for chest pain, palpitations and leg swelling.  ?Gastrointestinal:  Negative for abdominal pain, constipation and heartburn.  ?Musculoskeletal:  Negative for back pain, joint pain and myalgias.  ?Skin: Negative.   ?Neurological:  Negative for dizziness.  ?Psychiatric/Behavioral:  The patient is not nervous/anxious.   ? ? ?Physical Exam ?Constitutional:   ?   General: She is not in acute distress. ?   Appearance: She is well-developed. She is not diaphoretic.  ?Neck:  ?   Thyroid: No thyromegaly.  ?Cardiovascular:  ?   Rate and Rhythm: Normal rate and regular rhythm.  ?   Heart sounds: Normal heart sounds.  ?Pulmonary:  ?   Effort: Pulmonary effort is normal. No respiratory distress.  ?   Breath sounds: Normal breath sounds.  ?Chest:  ?   Comments: Right partial mastectomy  ?Abdominal:  ?   General: Bowel sounds are normal. There is no distension.  ?   Palpations: Abdomen is soft.  ?   Tenderness: There is no abdominal tenderness.  ?   Comments: Chole drain out  ?Musculoskeletal:     ?   General: Normal range of motion.  ?   Cervical back: Neck supple.  ?   Right lower leg: No edema.  ?   Left lower leg: No edema.  ?Lymphadenopathy:  ?   Cervical: No cervical adenopathy.  ?Skin: ?   General: Skin is warm and  dry.  ?Neurological:  ?   Mental Status: She is alert. Mental status is at baseline.  ?Psychiatric:     ?   Mood and Affect: Mood normal.  ? ? ? ?ASSESSMENT/ PLAN: ? ?TODAY ? ?Aortic atherosclerosis ?Vascul

## 2022-03-13 ENCOUNTER — Non-Acute Institutional Stay (SKILLED_NURSING_FACILITY): Payer: Medicare Other | Admitting: Internal Medicine

## 2022-03-13 ENCOUNTER — Encounter: Payer: Self-pay | Admitting: Internal Medicine

## 2022-03-13 DIAGNOSIS — D649 Anemia, unspecified: Secondary | ICD-10-CM | POA: Diagnosis not present

## 2022-03-13 DIAGNOSIS — I10 Essential (primary) hypertension: Secondary | ICD-10-CM

## 2022-03-13 DIAGNOSIS — N182 Chronic kidney disease, stage 2 (mild): Secondary | ICD-10-CM | POA: Diagnosis not present

## 2022-03-13 DIAGNOSIS — F0150 Vascular dementia without behavioral disturbance: Secondary | ICD-10-CM | POA: Diagnosis not present

## 2022-03-13 DIAGNOSIS — I700 Atherosclerosis of aorta: Secondary | ICD-10-CM

## 2022-03-13 DIAGNOSIS — N189 Chronic kidney disease, unspecified: Secondary | ICD-10-CM | POA: Insufficient documentation

## 2022-03-13 NOTE — Assessment & Plan Note (Signed)
BP controlled w/o antihypertensive medications.  No change indicated.

## 2022-03-13 NOTE — Progress Notes (Unsigned)
NURSING HOME LOCATION: [facility] ROOM NUMBER:  141 D  CODE STATUS:  DNR  PCP:  Ok Edwards NP,PSC  This is a nursing facility follow up visit of chronic medical diagnoses & to document compliance with Regulation 483.30 (c) in The [place] Manual Phase 2 which mandates caregiver visit ( visits can alternate among physician, PA or NP as per statutes) within 10 days of 30 days / 60 days/ 90 days post admission to SNF date    Interim medical record and care since last SNF visit was updated with review of diagnostic studies and change in clinical status since last visit were documented.  HPI: She is a permanent resident of this facility with medical diagnoses of aortic atherosclerosis, history of breast cancer, dementia, essential hypertension, B12 deficiency, and history of hypothyroidism.  Most recent labs were completed 12/26/2021 and revealed a creatinine of 0.88 with a GFR greater than 60 indicating CKD stage II; this represents a progressive improvement from stage IIIa.  CBC was normal. Last TSH was 3.260 on 01/23/21.  She has not on thyroid supplement.  Review of systems: Dementia invalidated responses.  She denies any active symptoms stating she is "fine".  She went on to elaborate "I hate to disappoint you, I am all right".  I was checking her pulse when she is mildly explained "quick pinch in my wrist".  As I tried to auscultate her abdomen she cried out as if in pain.  When I asked her if it were tender she said that I had "poked her".  Constitutional: No fever, significant weight change, fatigue  Eyes: No redness, discharge, pain, vision change ENT/mouth: No nasal congestion,  purulent discharge, earache, change in hearing, sore throat  Cardiovascular: No chest pain, palpitations, paroxysmal nocturnal dyspnea, claudication, edema  Respiratory: No cough, sputum production, hemoptysis, DOE, significant snoring, apnea   Gastrointestinal: No heartburn,  dysphagia, abdominal pain, nausea /vomiting, rectal bleeding, melena, change in bowels Genitourinary: No dysuria, hematuria, pyuria, incontinence, nocturia Musculoskeletal: No joint stiffness, joint swelling, weakness, pain Dermatologic: No rash, pruritus, change in appearance of skin Neurologic: No dizziness, headache, syncope, seizures, numbness, tingling Psychiatric: No significant anxiety, depression, insomnia, anorexia Endocrine: No change in hair/skin/nails, excessive thirst, excessive hunger, excessive urination  Hematologic/lymphatic: No significant bruising, lymphadenopathy, abnormal bleeding Allergy/immunology: No itchy/watery eyes, significant sneezing, urticaria, angioedema  Physical exam:  Pertinent or positive findings: She appears her age.  Initially when I entered the room she was asleep with her mouth agape.  She did not exhibit snoring, hypopnea, or frank apnea.  Lower lids are puffy.  Teeth are coated.  She has minor clinically insignificant low-grade rales anteriorly.  Heart sounds are somewhat distant.  Pedal pulses are decreased.  She has 1/2+ edema at the sock line.  Interosseous wasting of the hands is present. General appearance: Adequately nourished; no acute distress, increased work of breathing is present.   Lymphatic: No lymphadenopathy about the head, neck, axilla. Eyes: No conjunctival inflammation or lid edema is present. There is no scleral icterus. Ears:  External ear exam shows no significant lesions or deformities.   Nose:  External nasal examination shows no deformity or inflammation. Nasal mucosa are pink and moist without lesions, exudates Oral exam:  Lips and gums are healthy appearing. There is no oropharyngeal erythema or exudate. Neck:  No thyromegaly, masses, tenderness noted.    Heart:  Normal rate and regular rhythm. S1 and S2 normal without gallop, murmur, click, rub .  Lungs: Chest clear to auscultation without wheezes, rhonchi, rales,  rubs. Abdomen: Bowel sounds are normal. Abdomen is soft and nontender with no organomegaly, hernias, masses. GU: Deferred  Extremities:  No cyanosis, clubbing, edema  Neurologic exam : Cn 2-7 intact Strength equal  in upper & lower extremities Balance, Rhomberg, finger to nose testing could not be completed due to clinical state Deep tendon reflexes are equal Skin: Warm & dry w/o tenting. No significant lesions or rash.  See summary under each active problem in the Problem List with associated updated therapeutic plan

## 2022-03-13 NOTE — Patient Instructions (Signed)
See assessment and plan under each diagnosis in the problem list and acutely for this visit 

## 2022-03-13 NOTE — Assessment & Plan Note (Signed)
Previously CKD stage IIIa present; on 12/26/2021 creatinine 0.88 with a GFR greater than 60 indicating CKD stage II.  Medication list reviewed; no indication for change in medications or dosages.

## 2022-03-13 NOTE — Assessment & Plan Note (Signed)
She denies angina or anginal equivalents.  She is not on nitrates.

## 2022-03-13 NOTE — Assessment & Plan Note (Signed)
She does exhibit some mild sarcasm as she confabulates.  She was more interactive and less suspicious today than normally so when I interviewed and examined her.

## 2022-03-13 NOTE — Assessment & Plan Note (Signed)
12/26/2021 CBC was normal.  No bleeding dyscrasias reported at the SNF.

## 2022-04-18 ENCOUNTER — Encounter: Payer: Self-pay | Admitting: Adult Health

## 2022-04-18 NOTE — Progress Notes (Signed)
This encounter was created in error - please disregard.

## 2022-04-19 ENCOUNTER — Encounter: Payer: Self-pay | Admitting: Adult Health

## 2022-04-19 ENCOUNTER — Non-Acute Institutional Stay (SKILLED_NURSING_FACILITY): Payer: Medicare Other | Admitting: Adult Health

## 2022-04-19 DIAGNOSIS — K5909 Other constipation: Secondary | ICD-10-CM | POA: Diagnosis not present

## 2022-04-19 DIAGNOSIS — B009 Herpesviral infection, unspecified: Secondary | ICD-10-CM

## 2022-04-19 DIAGNOSIS — E441 Mild protein-calorie malnutrition: Secondary | ICD-10-CM

## 2022-04-19 NOTE — Progress Notes (Signed)
Location:  [facility] Room Number: 141/D Place of Service:  SNF (31)   CODE STATUS: DNR  No Known Allergies  Chief Complaint  Patient presents with   Medical Management of Chronic Issues                                               Chronic constipation: Herpes:  Protein calorie malnutrition    HPI:  She is a 88 year old long term resident of this facility being seen for the management of her chronic illnesses: Chronic constipation: Herpes:  Protein calorie malnutrition. Her chole drain remains out. There are no reports of uncontrolled pain. She does get out of bed daily.   Past Medical History:  Diagnosis Date   Aortic atherosclerosis ([place]) 11/15/2020   Breast cancer ([place])    Cognitive communication deficit    Dementia ([place])    Difficulty in walking(719.7)    Fall    HTN (hypertension) 08/26/2016   Muscle weakness (generalized)    Rash and nonspecific skin eruption    Thyroid disease    hypo   Urinary tract infection, site not specified    Vitamin B deficiency     Past Surgical History:  Procedure Laterality Date   COLON SURGERY     IR CATHETER TUBE CHANGE  05/07/2017   IR EXCHANGE BILIARY DRAIN  07/02/2017   IR EXCHANGE BILIARY DRAIN  07/10/2017   IR EXCHANGE BILIARY DRAIN  08/28/2017   IR EXCHANGE BILIARY DRAIN  10/11/2017   IR EXCHANGE BILIARY DRAIN  10/28/2017   IR EXCHANGE BILIARY DRAIN  12/23/2017   IR EXCHANGE BILIARY DRAIN  01/23/2018   IR EXCHANGE BILIARY DRAIN  02/17/2018   IR EXCHANGE BILIARY DRAIN  03/20/2018   IR EXCHANGE BILIARY DRAIN  04/17/2018   IR EXCHANGE BILIARY DRAIN  05/29/2018   IR EXCHANGE BILIARY DRAIN  07/24/2018   IR EXCHANGE BILIARY DRAIN  09/22/2018   IR EXCHANGE BILIARY DRAIN  11/20/2018   IR EXCHANGE BILIARY DRAIN  02/11/2019   IR EXCHANGE BILIARY DRAIN  04/14/2019   IR EXCHANGE BILIARY DRAIN  06/09/2019   IR EXCHANGE BILIARY DRAIN  11/26/2019   IR EXCHANGE BILIARY DRAIN  12/23/2019   IR EXCHANGE BILIARY DRAIN  02/22/2020   IR  EXCHANGE BILIARY DRAIN  05/25/2020   IR EXCHANGE BILIARY DRAIN  07/29/2020   IR EXCHANGE BILIARY DRAIN  08/30/2020   IR EXCHANGE BILIARY DRAIN  10/06/2020   IR EXCHANGE BILIARY DRAIN  11/18/2020   IR EXCHANGE BILIARY DRAIN  12/21/2020   IR EXCHANGE BILIARY DRAIN  02/03/2021   IR EXCHANGE BILIARY DRAIN  03/07/2021   IR EXCHANGE BILIARY DRAIN  05/04/2021   IR EXCHANGE BILIARY DRAIN  06/22/2021   IR EXCHANGE BILIARY DRAIN  07/04/2021   IR EXCHANGE BILIARY DRAIN  08/10/2021   IR PERC CHOLECYSTOSTOMY  01/27/2017   IR PERC CHOLECYSTOSTOMY  11/18/2019   IR RADIOLOGIST EVAL & MGMT  03/05/2017   IR SINUS/FIST TUBE CHK-NON GI  10/06/2020   MASTECTOMY, PARTIAL Right     Social History   Socioeconomic History   Marital status: Widowed    Spouse name: Not on file   Number of children: Not on file   Years of education: Not on file   Highest education level: Not on file  Occupational History   Not on file  Tobacco Use   Smoking status: Never   Smokeless tobacco: Never  Vaping Use   Vaping Use: Never used  Substance and Sexual Activity   Alcohol use: No   Drug use: No   Sexual activity: Not on file  Other Topics Concern   Not on file  Social History Narrative   Not on file   Social Determinants of Health   Financial Resource Strain: Not on file  Food Insecurity: Not on file  Transportation Needs: Not on file  Physical Activity: Inactive (07/21/2018)   Exercise Vital Sign    Days of Exercise per Week: 0 days    Minutes of Exercise per Session: 0 min  Stress: Not on file  Social Connections: Socially Isolated (07/21/2018)   Social Connection and Isolation Panel [NHANES]    Frequency of Communication with Friends and Family: Never    Frequency of Social Gatherings with Friends and Family: Never    Attends Religious Services: Never    Marine scientist or Organizations: No    Attends Archivist Meetings: Never    Marital Status: Widowed  Human resources officer Violence: Not on file    Family History  Problem Relation Age of Onset   Cancer Father        ? primary   Diabetes Neg Hx    Heart disease Neg Hx    Stroke Neg Hx       VITAL SIGNS BP (!) 115/54   Pulse 61   Temp 98 F (36.7 C)   Resp 18   Ht '5\' 3"'$  (1.6 m)   Wt 134 lb 9.6 oz (61.1 kg)   SpO2 98%   BMI 23.84 kg/m   Outpatient Encounter Medications as of 04/19/2022  Medication Sig   Balsam [place]-Castor Oil (VENELEX) OINT Apply topically. Apply to bilateral buttocks qshift for erythema.   calcium carbonate (TUMS - DOSED IN MG ELEMENTAL CALCIUM) 500 MG chewable tablet Chew 1 tablet by mouth daily.   cholecalciferol (VITAMIN D) 25 MCG (1000 UNIT) tablet Take 1,000 Units by mouth daily.   Cyanocobalamin (VITAMIN B-12) 1000 MCG SUBL Place 1 tablet under the tongue at bedtime.   donepezil (ARICEPT) 10 MG tablet Take 1 tablet by mouth at bedtime.   LORazepam (ATIVAN) 1 MG tablet Take 1 mg by mouth daily as needed for anxiety.   NON FORMULARY Diet: Dysphagia 3 thin liquids   Nutritional Supplements (ENSURE ENLIVE PO) Take 1.5 kcal by mouth daily.   polyethylene glycol (MIRALAX / GLYCOLAX) packet Take 17 g by mouth daily as needed for mild constipation or moderate constipation.    valACYclovir (VALTREX) 500 MG tablet Take 500 mg by mouth daily. Special Instructions: HSV suppression   No facility-administered encounter medications on file as of 04/19/2022.     SIGNIFICANT DIAGNOSTIC EXAMS   DIAGNOSTIC EXAMS  PREVIOUS   12-13-21: ct of abdomen and pelvis  Diverticulosis of colon. There is wall thickening along with pericolic stranding at the junction of descending and sigmoid colon in the left iliac fossa consistent with acute diverticulitis. There is no loculated pericolic abscess. There is gallbladder wall thickening along with gallbladder stones. Gallbladder is not distended. Findings may suggest chronic cholecystitis. If there is clinical suspicion for acute cholecystitis, gallbladder sonogram may be  considered. There is no hydronephrosis. Right kidney is small in size with multiple foci of cortical thinning suggesting chronic pyelonephritis. Small hiatal hernia.  NO NEW EXAMS    LABS REVIEWED: PREVIOUS:   07-27-21: wbc 5.1;  hgb 12.0; hct 36.7; mcv 101.1 plt 281; glucose 86; bun 16; creat 0.74; k+ 3.9; na++ 136; ca 8.4 GFR>60; liver normal albumin 3.0  12-13-21: wbc 17.4;hgb 11.8; hct 36.8; mcv98.4 plt 361; glucose 132; bun 17; creat 0.77; k+ 4.2; na++ 137; ca 8.7; GFR 53 protein 7.8; albumin 3.1 blood culture: no growth 12-16-21: wbc 7.4; hgb 11.0; hct 35.6;mcv 98.3 plt 358; glucose 93; bun 10; creat 0.94; k+ 3.5; na++ 139; ca 8.8; GFR 56  12-26-21: wbc 6.4; hgb 12.4; hct 39.1; mcv 99.5 plt 397; glucose 80; bun 15; creat 0.88; k+ 4.0; na++ 139; ca 9.2; GFR>60   NO NEW LABS.     Review of Systems  Constitutional:  Negative for malaise/fatigue.  Respiratory:  Negative for cough and shortness of breath.   Cardiovascular:  Negative for chest pain, palpitations and leg swelling.  Gastrointestinal:  Negative for abdominal pain, constipation and heartburn.  Musculoskeletal:  Negative for back pain, joint pain and myalgias.  Skin: Negative.   Neurological:  Negative for dizziness.  Psychiatric/Behavioral:  The patient is not nervous/anxious.    Physical Exam Constitutional:      General: She is not in acute distress.    Appearance: She is well-developed. She is not diaphoretic.  Neck:     Thyroid: No thyromegaly.  Cardiovascular:     Rate and Rhythm: Normal rate and regular rhythm.     Pulses: Normal pulses.     Heart sounds: Normal heart sounds.  Pulmonary:     Effort: Pulmonary effort is normal. No respiratory distress.     Breath sounds: Normal breath sounds.  Chest:     Comments: Right partial mastectomy  Abdominal:     General: Bowel sounds are normal. There is no distension.     Palpations: Abdomen is soft.     Tenderness: There is no abdominal tenderness.     Comments:  Chole drain out   Musculoskeletal:        General: Normal range of motion.     Cervical back: Neck supple.     Right lower leg: No edema.     Left lower leg: No edema.  Lymphadenopathy:     Cervical: No cervical adenopathy.  Skin:    General: Skin is warm and dry.  Neurological:     Mental Status: She is alert. Mental status is at baseline.  Psychiatric:        Mood and Affect: Mood normal.       ASSESSMENT/ PLAN:  TODAY  Chronic constipation: will continue miralax daily as needed  2. Herpes: without breakout: will continue valtrex 500 mg daily   3. Protein calorie malnutrition: albumin 3.1 protein 7.8; will continue supplements as directed.     PREVIOUS   4. Chronic anemia: hgb 12.4 will monitor    5. Aortic atherosclerosis (ct 08-26-19) will monitor due to advanced age not on statin  6. Major neurocognitive disorder/vascular dementia without behavioral disturbance: weight is 134 pounds; will continue aricept 10 mg daily   7. Primary hypertension: b/p 115/54   8. Calculus of gall bladder without acute cholecystitis and obstruction: chole drain out at this time.    Ok Edwards NP [facility]  call [phone]

## 2022-04-30 DIAGNOSIS — B351 Tinea unguium: Secondary | ICD-10-CM | POA: Diagnosis not present

## 2022-04-30 DIAGNOSIS — M2041 Other hammer toe(s) (acquired), right foot: Secondary | ICD-10-CM | POA: Diagnosis not present

## 2022-04-30 DIAGNOSIS — I739 Peripheral vascular disease, unspecified: Secondary | ICD-10-CM | POA: Diagnosis not present

## 2022-04-30 DIAGNOSIS — M2042 Other hammer toe(s) (acquired), left foot: Secondary | ICD-10-CM | POA: Diagnosis not present

## 2022-05-17 ENCOUNTER — Encounter: Payer: Self-pay | Admitting: Adult Health

## 2022-05-17 ENCOUNTER — Non-Acute Institutional Stay (SKILLED_NURSING_FACILITY): Payer: Medicare Other | Admitting: Adult Health

## 2022-05-17 DIAGNOSIS — F0390 Unspecified dementia without behavioral disturbance: Secondary | ICD-10-CM

## 2022-05-17 DIAGNOSIS — F0150 Vascular dementia without behavioral disturbance: Secondary | ICD-10-CM | POA: Diagnosis not present

## 2022-05-17 DIAGNOSIS — D649 Anemia, unspecified: Secondary | ICD-10-CM | POA: Diagnosis not present

## 2022-05-17 DIAGNOSIS — Z66 Do not resuscitate: Secondary | ICD-10-CM | POA: Diagnosis not present

## 2022-05-17 DIAGNOSIS — I700 Atherosclerosis of aorta: Secondary | ICD-10-CM | POA: Diagnosis not present

## 2022-05-17 NOTE — Progress Notes (Signed)
Location:  [place] Room Number: 141-D Place of Service:  SNF (31)   CODE STATUS: DNR  No Known Allergies  Chief Complaint  Patient presents with   Medical Management of Chronic Issues                                           Chronic anemia:  Aortic atherosclerosis  Major neurocognitive disorder/vascular dementia without behavioral disturbance;    HPI:  She is a 88 year old long term resident of this facility being seen for the management of her chronic illnesses:   Chronic anemia:  Aortic atherosclerosis  Major neurocognitive disorder/vascular dementia without behavioral disturbance. There are no reports of uncontrolled pain. She is having drainage at her chole drain site; no redness present; no pain present.   Past Medical History:  Diagnosis Date   Aortic atherosclerosis ([place]) 11/15/2020   Breast cancer ([place])    Cognitive communication deficit    Dementia ([place])    Difficulty in walking(719.7)    Fall    HTN (hypertension) 08/26/2016   Muscle weakness (generalized)    Rash and nonspecific skin eruption    Thyroid disease    hypo   Urinary tract infection, site not specified    Vitamin B deficiency     Past Surgical History:  Procedure Laterality Date   COLON SURGERY     IR CATHETER TUBE CHANGE  05/07/2017   IR EXCHANGE BILIARY DRAIN  07/02/2017   IR EXCHANGE BILIARY DRAIN  07/10/2017   IR EXCHANGE BILIARY DRAIN  08/28/2017   IR EXCHANGE BILIARY DRAIN  10/11/2017   IR EXCHANGE BILIARY DRAIN  10/28/2017   IR EXCHANGE BILIARY DRAIN  12/23/2017   IR EXCHANGE BILIARY DRAIN  01/23/2018   IR EXCHANGE BILIARY DRAIN  02/17/2018   IR EXCHANGE BILIARY DRAIN  03/20/2018   IR EXCHANGE BILIARY DRAIN  04/17/2018   IR EXCHANGE BILIARY DRAIN  05/29/2018   IR EXCHANGE BILIARY DRAIN  07/24/2018   IR EXCHANGE BILIARY DRAIN  09/22/2018   IR EXCHANGE BILIARY DRAIN  11/20/2018   IR EXCHANGE BILIARY DRAIN  02/11/2019   IR EXCHANGE BILIARY DRAIN  04/14/2019   IR EXCHANGE  BILIARY DRAIN  06/09/2019   IR EXCHANGE BILIARY DRAIN  11/26/2019   IR EXCHANGE BILIARY DRAIN  12/23/2019   IR EXCHANGE BILIARY DRAIN  02/22/2020   IR EXCHANGE BILIARY DRAIN  05/25/2020   IR EXCHANGE BILIARY DRAIN  07/29/2020   IR EXCHANGE BILIARY DRAIN  08/30/2020   IR EXCHANGE BILIARY DRAIN  10/06/2020   IR EXCHANGE BILIARY DRAIN  11/18/2020   IR EXCHANGE BILIARY DRAIN  12/21/2020   IR EXCHANGE BILIARY DRAIN  02/03/2021   IR EXCHANGE BILIARY DRAIN  03/07/2021   IR EXCHANGE BILIARY DRAIN  05/04/2021   IR EXCHANGE BILIARY DRAIN  06/22/2021   IR EXCHANGE BILIARY DRAIN  07/04/2021   IR EXCHANGE BILIARY DRAIN  08/10/2021   IR PERC CHOLECYSTOSTOMY  01/27/2017   IR PERC CHOLECYSTOSTOMY  11/18/2019   IR RADIOLOGIST EVAL & MGMT  03/05/2017   IR SINUS/FIST TUBE CHK-NON GI  10/06/2020   MASTECTOMY, PARTIAL Right     Social History   Socioeconomic History   Marital status: Widowed    Spouse name: Not on file   Number of children: Not on file   Years of education: Not on file   Highest  education level: Not on file  Occupational History   Not on file  Tobacco Use   Smoking status: Never   Smokeless tobacco: Never  Vaping Use   Vaping Use: Never used  Substance and Sexual Activity   Alcohol use: No   Drug use: No   Sexual activity: Not on file  Other Topics Concern   Not on file  Social History Narrative   Not on file   Social Determinants of Health   Financial Resource Strain: Low Risk  (07/21/2018)   Overall Financial Resource Strain (CARDIA)    Difficulty of Paying Living Expenses: Not hard at all  Food Insecurity: No Food Insecurity (07/21/2018)   Hunger Vital Sign    Worried About Running Out of Food in the Last Year: Never true    Ran Out of Food in the Last Year: Never true  Transportation Needs: No Transportation Needs (07/21/2018)   PRAPARE - Hydrologist (Medical): No    Lack of Transportation (Non-Medical): No  Physical Activity: Inactive (07/21/2018)    Exercise Vital Sign    Days of Exercise per Week: 0 days    Minutes of Exercise per Session: 0 min  Stress: No Stress Concern Present (07/21/2018)   [place]    Feeling of Stress : Not at all  Social Connections: Socially Isolated (07/21/2018)   Social Connection and Isolation Panel [NHANES]    Frequency of Communication with Friends and Family: Never    Frequency of Social Gatherings with Friends and Family: Never    Attends Religious Services: Never    Marine scientist or Organizations: No    Attends Archivist Meetings: Never    Marital Status: Widowed  Intimate Partner Violence: Not At Risk (07/21/2018)   Humiliation, Afraid, Rape, and Kick questionnaire    Fear of Current or Ex-Partner: No    Emotionally Abused: No    Physically Abused: No    Sexually Abused: No   Family History  Problem Relation Age of Onset   Cancer Father        ? primary   Diabetes Neg Hx    Heart disease Neg Hx    Stroke Neg Hx       VITAL SIGNS BP 124/76   Pulse 75   Temp (!) 97.3 F (36.3 C)   Resp 20   Ht '5\' 3"'$  (1.6 m)   Wt 135 lb 9.6 oz (61.5 kg)   SpO2 99%   BMI 24.02 kg/m   Outpatient Encounter Medications as of 05/17/2022  Medication Sig   Balsam [place]-Castor Oil (VENELEX) OINT Apply topically. Apply to bilateral buttocks qshift for erythema.   calcium carbonate (TUMS - DOSED IN MG ELEMENTAL CALCIUM) 500 MG chewable tablet Chew 1 tablet by mouth daily.   cholecalciferol (VITAMIN D) 25 MCG (1000 UNIT) tablet Take 1,000 Units by mouth daily.   Cyanocobalamin (VITAMIN B-12) 1000 MCG SUBL Place 1 tablet under the tongue at bedtime.   donepezil (ARICEPT) 10 MG tablet Take 1 tablet by mouth at bedtime.   LORazepam (ATIVAN) 1 MG tablet Take 1 mg by mouth daily as needed for anxiety.   NON FORMULARY Diet: Dysphagia 3 thin liquids   Nutritional Supplements (ENSURE ENLIVE PO) Take 1.5 kcal by mouth daily.    polyethylene glycol (MIRALAX / GLYCOLAX) packet Take 17 g by mouth daily as needed for mild constipation or moderate constipation.    [DISCONTINUED]  valACYclovir (VALTREX) 500 MG tablet Take 500 mg by mouth daily. Special Instructions: HSV suppression   No facility-administered encounter medications on file as of 05/17/2022.     SIGNIFICANT DIAGNOSTIC EXAMS   DIAGNOSTIC EXAMS  PREVIOUS   12-13-21: ct of abdomen and pelvis  Diverticulosis of colon. There is wall thickening along with pericolic stranding at the junction of descending and sigmoid colon in the left iliac fossa consistent with acute diverticulitis. There is no loculated pericolic abscess. There is gallbladder wall thickening along with gallbladder stones. Gallbladder is not distended. Findings may suggest chronic cholecystitis. If there is clinical suspicion for acute cholecystitis, gallbladder sonogram may be considered. There is no hydronephrosis. Right kidney is small in size with multiple foci of cortical thinning suggesting chronic pyelonephritis. Small hiatal hernia.  NO NEW EXAMS    LABS REVIEWED: PREVIOUS:   07-27-21: wbc 5.1; hgb 12.0; hct 36.7; mcv 101.1 plt 281; glucose 86; bun 16; creat 0.74; k+ 3.9; na++ 136; ca 8.4 GFR>60; liver normal albumin 3.0  12-13-21: wbc 17.4;hgb 11.8; hct 36.8; mcv98.4 plt 361; glucose 132; bun 17; creat 0.77; k+ 4.2; na++ 137; ca 8.7; GFR 53 protein 7.8; albumin 3.1 blood culture: no growth 12-16-21: wbc 7.4; hgb 11.0; hct 35.6;mcv 98.3 plt 358; glucose 93; bun 10; creat 0.94; k+ 3.5; na++ 139; ca 8.8; GFR 56  12-26-21: wbc 6.4; hgb 12.4; hct 39.1; mcv 99.5 plt 397; glucose 80; bun 15; creat 0.88; k+ 4.0; na++ 139; ca 9.2; GFR>60   NO NEW LABS.    Review of Systems  Constitutional:  Negative for malaise/fatigue.  Respiratory:  Negative for cough and shortness of breath.   Cardiovascular:  Negative for chest pain, palpitations and leg swelling.  Gastrointestinal:  Negative for abdominal  pain, constipation and heartburn.  Musculoskeletal:  Negative for back pain, joint pain and myalgias.  Skin: Negative.   Neurological:  Negative for dizziness.  Psychiatric/Behavioral:  The patient is not nervous/anxious.    Physical Exam Constitutional:      General: She is not in acute distress.    Appearance: She is well-developed. She is not diaphoretic.  Neck:     Thyroid: No thyromegaly.  Cardiovascular:     Rate and Rhythm: Normal rate and regular rhythm.     Heart sounds: Normal heart sounds.  Pulmonary:     Effort: Pulmonary effort is normal. No respiratory distress.     Breath sounds: Normal breath sounds.  Chest:     Comments: Right partial mastectomy  Abdominal:     General: Bowel sounds are normal. There is no distension.     Palpations: Abdomen is soft.     Tenderness: There is no abdominal tenderness.     Comments: Chole drain out; has had some drainage present without signs of infection present; no pain   Musculoskeletal:        General: Normal range of motion.     Cervical back: Neck supple.     Right lower leg: No edema.     Left lower leg: No edema.  Lymphadenopathy:     Cervical: No cervical adenopathy.  Skin:    General: Skin is warm and dry.  Neurological:     Mental Status: She is alert and oriented to person, place, and time.          ASSESSMENT/ PLAN:  TODAY  Chronic anemia: hgb 12.4 will monitor   2. Aortic atherosclerosis (ct 08-26-19) due to advanced age is not on statin.   3.  Major neurocognitive disorder/vascular dementia without behavioral disturbance; weight is 135 pounds; weight is stable will continue aricept 10 mg daily   PREVIOUS   4. Primary hypertension: b/p 124/76   5. Calculus of gall bladder without acute cholecystitis and obstruction: chole drain out at this time; has had some drainage present; if she develops; pain; redness; or fever will need tube reinserted.   6. Chronic constipation: will continue miralax daily as  needed  7. Herpes: without breakout: her valtrex has been stopped will monitor   8. Protein calorie malnutrition: albumin 3.1 protein 7.8; will continue supplements as directed.     Ok Edwards NP [facility]   call [phone]

## 2022-05-18 ENCOUNTER — Encounter: Payer: Self-pay | Admitting: Adult Health

## 2022-05-18 ENCOUNTER — Non-Acute Institutional Stay (SKILLED_NURSING_FACILITY): Payer: Medicare Other | Admitting: Adult Health

## 2022-05-18 DIAGNOSIS — E441 Mild protein-calorie malnutrition: Secondary | ICD-10-CM | POA: Diagnosis not present

## 2022-05-18 DIAGNOSIS — F0390 Unspecified dementia without behavioral disturbance: Secondary | ICD-10-CM

## 2022-05-18 DIAGNOSIS — I700 Atherosclerosis of aorta: Secondary | ICD-10-CM

## 2022-05-18 DIAGNOSIS — F0150 Vascular dementia without behavioral disturbance: Secondary | ICD-10-CM | POA: Diagnosis not present

## 2022-05-18 NOTE — Progress Notes (Signed)
Location:  [place] Room Number: 141 D Place of Service:  SNF (31) Provider:  Ok Edwards, NP  CODE STATUS: DNR  No Known Allergies  Chief Complaint  Patient presents with   Acute Visit    Care plan meeting    HPI:  We have come together for her care plan meeting. BIMS 5/15 mood 0/30. She is ambulatory with no falls. She requires independent to limited assistance with her adls. She is frequently incontinent of bladder and bowel. Dietary: 135.6 pounds; diet: D3 25-100% of meals. Therapy: none at this time. She has been having new drainage right upper quad. There are no reports of pain present. She does not participate in activities. She continues to be followed for her chronic illnesses including:  Aortic atherosclerosis  Major neurocognitive disorder  Vascular dementia without behavioral disturbance   Protein calorie malnutrition mild  Past Medical History:  Diagnosis Date   Aortic atherosclerosis ([place]) 11/15/2020   Breast cancer ([place])    Cognitive communication deficit    Dementia ([place])    Difficulty in walking(719.7)    Fall    HTN (hypertension) 08/26/2016   Muscle weakness (generalized)    Rash and nonspecific skin eruption    Thyroid disease    hypo   Urinary tract infection, site not specified    Vitamin B deficiency     Past Surgical History:  Procedure Laterality Date   COLON SURGERY     IR CATHETER TUBE CHANGE  05/07/2017   IR EXCHANGE BILIARY DRAIN  07/02/2017   IR EXCHANGE BILIARY DRAIN  07/10/2017   IR EXCHANGE BILIARY DRAIN  08/28/2017   IR EXCHANGE BILIARY DRAIN  10/11/2017   IR EXCHANGE BILIARY DRAIN  10/28/2017   IR EXCHANGE BILIARY DRAIN  12/23/2017   IR EXCHANGE BILIARY DRAIN  01/23/2018   IR EXCHANGE BILIARY DRAIN  02/17/2018   IR EXCHANGE BILIARY DRAIN  03/20/2018   IR EXCHANGE BILIARY DRAIN  04/17/2018   IR EXCHANGE BILIARY DRAIN  05/29/2018   IR EXCHANGE BILIARY DRAIN  07/24/2018   IR EXCHANGE BILIARY DRAIN  09/22/2018   IR EXCHANGE  BILIARY DRAIN  11/20/2018   IR EXCHANGE BILIARY DRAIN  02/11/2019   IR EXCHANGE BILIARY DRAIN  04/14/2019   IR EXCHANGE BILIARY DRAIN  06/09/2019   IR EXCHANGE BILIARY DRAIN  11/26/2019   IR EXCHANGE BILIARY DRAIN  12/23/2019   IR EXCHANGE BILIARY DRAIN  02/22/2020   IR EXCHANGE BILIARY DRAIN  05/25/2020   IR EXCHANGE BILIARY DRAIN  07/29/2020   IR EXCHANGE BILIARY DRAIN  08/30/2020   IR EXCHANGE BILIARY DRAIN  10/06/2020   IR EXCHANGE BILIARY DRAIN  11/18/2020   IR EXCHANGE BILIARY DRAIN  12/21/2020   IR EXCHANGE BILIARY DRAIN  02/03/2021   IR EXCHANGE BILIARY DRAIN  03/07/2021   IR EXCHANGE BILIARY DRAIN  05/04/2021   IR EXCHANGE BILIARY DRAIN  06/22/2021   IR EXCHANGE BILIARY DRAIN  07/04/2021   IR EXCHANGE BILIARY DRAIN  08/10/2021   IR PERC CHOLECYSTOSTOMY  01/27/2017   IR PERC CHOLECYSTOSTOMY  11/18/2019   IR RADIOLOGIST EVAL & MGMT  03/05/2017   IR SINUS/FIST TUBE CHK-NON GI  10/06/2020   MASTECTOMY, PARTIAL Right     Social History   Socioeconomic History   Marital status: Widowed    Spouse name: Not on file   Number of children: Not on file   Years of education: Not on file   Highest education level: Not on file  Occupational History   Not on file  Tobacco Use   Smoking status: Never   Smokeless tobacco: Never  Vaping Use   Vaping Use: Never used  Substance and Sexual Activity   Alcohol use: No   Drug use: No   Sexual activity: Not on file  Other Topics Concern   Not on file  Social History Narrative   Not on file   Social Determinants of Health   Financial Resource Strain: Low Risk  (07/21/2018)   Overall Financial Resource Strain (CARDIA)    Difficulty of Paying Living Expenses: Not hard at all  Food Insecurity: No Food Insecurity (07/21/2018)   Hunger Vital Sign    Worried About Running Out of Food in the Last Year: Never true    Ran Out of Food in the Last Year: Never true  Transportation Needs: No Transportation Needs (07/21/2018)   PRAPARE - Radiographer, therapeutic (Medical): No    Lack of Transportation (Non-Medical): No  Physical Activity: Inactive (07/21/2018)   Exercise Vital Sign    Days of Exercise per Week: 0 days    Minutes of Exercise per Session: 0 min  Stress: No Stress Concern Present (07/21/2018)   [place]    Feeling of Stress : Not at all  Social Connections: Socially Isolated (07/21/2018)   Social Connection and Isolation Panel [NHANES]    Frequency of Communication with Friends and Family: Never    Frequency of Social Gatherings with Friends and Family: Never    Attends Religious Services: Never    Marine scientist or Organizations: No    Attends Archivist Meetings: Never    Marital Status: Widowed  Intimate Partner Violence: Not At Risk (07/21/2018)   Humiliation, Afraid, Rape, and Kick questionnaire    Fear of Current or Ex-Partner: No    Emotionally Abused: No    Physically Abused: No    Sexually Abused: No   Family History  Problem Relation Age of Onset   Cancer Father        ? primary   Diabetes Neg Hx    Heart disease Neg Hx    Stroke Neg Hx       VITAL SIGNS BP 124/76   Pulse 75   Temp (!) 97.3 F (36.3 C)   Resp 20   Ht '5\' 3"'$  (1.6 m)   Wt 135 lb 9.6 oz (61.5 kg)   SpO2 99%   BMI 24.02 kg/m   Outpatient Encounter Medications as of 05/18/2022  Medication Sig   Balsam [place]-Castor Oil (VENELEX) OINT Apply topically. Apply to bilateral buttocks qshift for erythema.   calcium carbonate (TUMS - DOSED IN MG ELEMENTAL CALCIUM) 500 MG chewable tablet Chew 1 tablet by mouth daily.   cholecalciferol (VITAMIN D) 25 MCG (1000 UNIT) tablet Take 1,000 Units by mouth daily.   Cyanocobalamin (VITAMIN B-12) 1000 MCG SUBL Place 1 tablet under the tongue at bedtime.   donepezil (ARICEPT) 10 MG tablet Take 1 tablet by mouth at bedtime.   LORazepam (ATIVAN) 0.5 MG tablet Take 0.5 mg by mouth daily as needed for anxiety.   NON  FORMULARY Diet: Dysphagia 3 thin liquids   Nutritional Supplements (ENSURE ENLIVE PO) Take 1.5 kcal by mouth daily.   polyethylene glycol (MIRALAX / GLYCOLAX) packet Take 17 g by mouth daily as needed for mild constipation or moderate constipation.    No facility-administered encounter medications on file as  of 05/18/2022.     SIGNIFICANT DIAGNOSTIC EXAMS   DIAGNOSTIC EXAMS  PREVIOUS   12-13-21: ct of abdomen and pelvis  Diverticulosis of colon. There is wall thickening along with pericolic stranding at the junction of descending and sigmoid colon in the left iliac fossa consistent with acute diverticulitis. There is no loculated pericolic abscess. There is gallbladder wall thickening along with gallbladder stones. Gallbladder is not distended. Findings may suggest chronic cholecystitis. If there is clinical suspicion for acute cholecystitis, gallbladder sonogram may be considered. There is no hydronephrosis. Right kidney is small in size with multiple foci of cortical thinning suggesting chronic pyelonephritis. Small hiatal hernia.  NO NEW EXAMS    LABS REVIEWED: PREVIOUS:   07-27-21: wbc 5.1; hgb 12.0; hct 36.7; mcv 101.1 plt 281; glucose 86; bun 16; creat 0.74; k+ 3.9; na++ 136; ca 8.4 GFR>60; liver normal albumin 3.0  12-13-21: wbc 17.4;hgb 11.8; hct 36.8; mcv98.4 plt 361; glucose 132; bun 17; creat 0.77; k+ 4.2; na++ 137; ca 8.7; GFR 53 protein 7.8; albumin 3.1 blood culture: no growth 12-16-21: wbc 7.4; hgb 11.0; hct 35.6;mcv 98.3 plt 358; glucose 93; bun 10; creat 0.94; k+ 3.5; na++ 139; ca 8.8; GFR 56  12-26-21: wbc 6.4; hgb 12.4; hct 39.1; mcv 99.5 plt 397; glucose 80; bun 15; creat 0.88; k+ 4.0; na++ 139; ca 9.2; GFR>60   NO NEW LABS.    Review of Systems  Constitutional:  Negative for malaise/fatigue.  Respiratory:  Negative for cough and shortness of breath.   Cardiovascular:  Negative for chest pain, palpitations and leg swelling.  Gastrointestinal:  Negative for abdominal  pain, constipation and heartburn.  Musculoskeletal:  Negative for back pain, joint pain and myalgias.  Skin: Negative.   Neurological:  Negative for dizziness.  Psychiatric/Behavioral:  The patient is not nervous/anxious.    Physical Exam Constitutional:      General: She is not in acute distress.    Appearance: She is well-developed. She is not diaphoretic.  Neck:     Thyroid: No thyromegaly.  Cardiovascular:     Rate and Rhythm: Normal rate and regular rhythm.     Heart sounds: Normal heart sounds.  Pulmonary:     Effort: Pulmonary effort is normal. No respiratory distress.     Breath sounds: Normal breath sounds.  Chest:     Comments: Right partial mastectomy Abdominal:     General: Bowel sounds are normal. There is no distension.     Palpations: Abdomen is soft.     Tenderness: There is no abdominal tenderness.     Comments: Chole drain out    Musculoskeletal:        General: Normal range of motion.     Cervical back: Neck supple.     Right lower leg: No edema.     Left lower leg: No edema.  Lymphadenopathy:     Cervical: No cervical adenopathy.  Skin:    General: Skin is warm and dry.  Neurological:     Mental Status: She is alert. Mental status is at baseline.  Psychiatric:        Mood and Affect: Mood normal.        ASSESSMENT/ PLAN:  TODAY  Aortic atherosclerosis Major neurocognitive disorder Vascular dementia without behavioral disturbance Protein calorie malnutrition mild   Will continue current medications Will continue current plan of care Will continue to monitor her status.    Time spent with patient: 40 minutes: plan of care; medications; dietary    Neoma Laming  Blakley Michna NP [facility]   call [phone]

## 2022-06-12 ENCOUNTER — Non-Acute Institutional Stay (SKILLED_NURSING_FACILITY): Payer: Medicare Other | Admitting: Adult Health

## 2022-06-12 ENCOUNTER — Encounter: Payer: Self-pay | Admitting: Adult Health

## 2022-06-12 DIAGNOSIS — Z0000 Encounter for general adult medical examination without abnormal findings: Secondary | ICD-10-CM

## 2022-06-12 NOTE — Progress Notes (Unsigned)
Location:  [place] Room Number: 141 D Place of Service:  SNF (31) Provider:  Ok Edwards, NP  CODE STATUS: DNR  No Known Allergies  Chief Complaint  Patient presents with   Acute Visit    Osteoporosis    HPI:    Past Medical History:  Diagnosis Date   Aortic atherosclerosis ([place]) 11/15/2020   Breast cancer ([place])    Cognitive communication deficit    Dementia ([place])    Difficulty in walking(719.7)    Fall    HTN (hypertension) 08/26/2016   Muscle weakness (generalized)    Rash and nonspecific skin eruption    Thyroid disease    hypo   Urinary tract infection, site not specified    Vitamin B deficiency     Past Surgical History:  Procedure Laterality Date   COLON SURGERY     IR CATHETER TUBE CHANGE  05/07/2017   IR EXCHANGE BILIARY DRAIN  07/02/2017   IR EXCHANGE BILIARY DRAIN  07/10/2017   IR EXCHANGE BILIARY DRAIN  08/28/2017   IR EXCHANGE BILIARY DRAIN  10/11/2017   IR EXCHANGE BILIARY DRAIN  10/28/2017   IR EXCHANGE BILIARY DRAIN  12/23/2017   IR EXCHANGE BILIARY DRAIN  01/23/2018   IR EXCHANGE BILIARY DRAIN  02/17/2018   IR EXCHANGE BILIARY DRAIN  03/20/2018   IR EXCHANGE BILIARY DRAIN  04/17/2018   IR EXCHANGE BILIARY DRAIN  05/29/2018   IR EXCHANGE BILIARY DRAIN  07/24/2018   IR EXCHANGE BILIARY DRAIN  09/22/2018   IR EXCHANGE BILIARY DRAIN  11/20/2018   IR EXCHANGE BILIARY DRAIN  02/11/2019   IR EXCHANGE BILIARY DRAIN  04/14/2019   IR EXCHANGE BILIARY DRAIN  06/09/2019   IR EXCHANGE BILIARY DRAIN  11/26/2019   IR EXCHANGE BILIARY DRAIN  12/23/2019   IR EXCHANGE BILIARY DRAIN  02/22/2020   IR EXCHANGE BILIARY DRAIN  05/25/2020   IR EXCHANGE BILIARY DRAIN  07/29/2020   IR EXCHANGE BILIARY DRAIN  08/30/2020   IR EXCHANGE BILIARY DRAIN  10/06/2020   IR EXCHANGE BILIARY DRAIN  11/18/2020   IR EXCHANGE BILIARY DRAIN  12/21/2020   IR EXCHANGE BILIARY DRAIN  02/03/2021   IR EXCHANGE BILIARY DRAIN  03/07/2021   IR EXCHANGE BILIARY DRAIN  05/04/2021   IR  EXCHANGE BILIARY DRAIN  06/22/2021   IR EXCHANGE BILIARY DRAIN  07/04/2021   IR EXCHANGE BILIARY DRAIN  08/10/2021   IR PERC CHOLECYSTOSTOMY  01/27/2017   IR PERC CHOLECYSTOSTOMY  11/18/2019   IR RADIOLOGIST EVAL & MGMT  03/05/2017   IR SINUS/FIST TUBE CHK-NON GI  10/06/2020   MASTECTOMY, PARTIAL Right     Social History   Socioeconomic History   Marital status: Widowed    Spouse name: Not on file   Number of children: Not on file   Years of education: Not on file   Highest education level: Not on file  Occupational History   Not on file  Tobacco Use   Smoking status: Never   Smokeless tobacco: Never  Vaping Use   Vaping Use: Never used  Substance and Sexual Activity   Alcohol use: No   Drug use: No   Sexual activity: Not on file  Other Topics Concern   Not on file  Social History Narrative   Not on file   Social Determinants of Health   Financial Resource Strain: Low Risk  (07/21/2018)   Overall Financial Resource Strain (CARDIA)    Difficulty of Paying Living Expenses: Not hard  at all  Food Insecurity: No Food Insecurity (07/21/2018)   Hunger Vital Sign    Worried About Running Out of Food in the Last Year: Never true    Ran Out of Food in the Last Year: Never true  Transportation Needs: No Transportation Needs (07/21/2018)   PRAPARE - Hydrologist (Medical): No    Lack of Transportation (Non-Medical): No  Physical Activity: Inactive (07/21/2018)   Exercise Vital Sign    Days of Exercise per Week: 0 days    Minutes of Exercise per Session: 0 min  Stress: No Stress Concern Present (07/21/2018)   [place]    Feeling of Stress : Not at all  Social Connections: Socially Isolated (07/21/2018)   Social Connection and Isolation Panel [NHANES]    Frequency of Communication with Friends and Family: Never    Frequency of Social Gatherings with Friends and Family: Never    Attends  Religious Services: Never    Marine scientist or Organizations: No    Attends Archivist Meetings: Never    Marital Status: Widowed  Intimate Partner Violence: Not At Risk (07/21/2018)   Humiliation, Afraid, Rape, and Kick questionnaire    Fear of Current or Ex-Partner: No    Emotionally Abused: No    Physically Abused: No    Sexually Abused: No   Family History  Problem Relation Age of Onset   Cancer Father        ? primary   Diabetes Neg Hx    Heart disease Neg Hx    Stroke Neg Hx       VITAL SIGNS BP 123/70   Pulse 70   Temp 97.9 F (36.6 C)   Resp 20   Ht '5\' 3"'$  (1.6 m)   Wt 136 lb 12.8 oz (62.1 kg)   SpO2 99%   BMI 24.23 kg/m   Outpatient Encounter Medications as of 06/12/2022  Medication Sig   Balsam [place]-Castor Oil (VENELEX) OINT Apply topically. Apply to bilateral buttocks qshift for erythema.   calcium carbonate (TUMS - DOSED IN MG ELEMENTAL CALCIUM) 500 MG chewable tablet Chew 1 tablet by mouth daily.   cholecalciferol (VITAMIN D) 25 MCG (1000 UNIT) tablet Take 1,000 Units by mouth daily.   Cyanocobalamin (VITAMIN B-12) 1000 MCG SUBL Place 1 tablet under the tongue at bedtime.   donepezil (ARICEPT) 10 MG tablet Take 1 tablet by mouth at bedtime.   LORazepam (ATIVAN) 0.5 MG tablet Take 0.5 mg by mouth daily as needed for anxiety.   NON FORMULARY Diet: Dysphagia 3 thin liquids   Nutritional Supplements (ENSURE ENLIVE PO) Take 1.5 kcal by mouth daily.   polyethylene glycol (MIRALAX / GLYCOLAX) packet Take 17 g by mouth daily as needed for mild constipation or moderate constipation.    No facility-administered encounter medications on file as of 06/12/2022.     SIGNIFICANT DIAGNOSTIC EXAMS       ASSESSMENT/ PLAN:     Ok Edwards NP [facility]  Contact [phone] Monday through Friday 8am- 5pm  After hours call [phone]

## 2022-06-13 ENCOUNTER — Encounter: Payer: Self-pay | Admitting: Adult Health

## 2022-06-13 NOTE — Patient Instructions (Signed)
  Cynthia [name] , Thank you for taking time to come for your Medicare Wellness Visit. I appreciate your ongoing commitment to your health goals. Please review the following plan we discussed and let me know if I can assist you in the future.   These are the goals we discussed:  Goals      DIET - INCREASE WATER INTAKE     Follow up with Provider as scheduled     General - Client will not be readmitted within 30 days ([facility])        This is a list of the screening recommended for you and due dates:  Health Maintenance  Topic Date Due   COVID-19 Vaccine (5 - Moderna risk series) 10/10/2021   Flu Shot  05/22/2022   Tetanus Vaccine  07/12/2027   Pneumonia Vaccine  Completed   DEXA scan (bone density measurement)  Completed   Zoster (Shingles) Vaccine  Completed   HPV Vaccine  Aged Out

## 2022-06-13 NOTE — Progress Notes (Unsigned)
Location:  [place] Room Number: 141-D Place of Service:  SNF (31)   CODE STATUS: DNR  No Known Allergies  Chief Complaint  Patient presents with  . Acute Visit    Osteoporosis     HPI:    Past Medical History:  Diagnosis Date  . Aortic atherosclerosis ([place]) 11/15/2020  . Breast cancer ([place])   . Cognitive communication deficit   . Dementia ([place])   . Difficulty in walking(719.7)   . Fall   . HTN (hypertension) 08/26/2016  . Muscle weakness (generalized)   . Rash and nonspecific skin eruption   . Thyroid disease    hypo  . Urinary tract infection, site not specified   . Vitamin B deficiency     Past Surgical History:  Procedure Laterality Date  . COLON SURGERY    . IR CATHETER TUBE CHANGE  05/07/2017  . IR EXCHANGE BILIARY DRAIN  07/02/2017  . IR EXCHANGE BILIARY DRAIN  07/10/2017  . IR EXCHANGE BILIARY DRAIN  08/28/2017  . IR EXCHANGE BILIARY DRAIN  10/11/2017  . IR EXCHANGE BILIARY DRAIN  10/28/2017  . IR EXCHANGE BILIARY DRAIN  12/23/2017  . IR EXCHANGE BILIARY DRAIN  01/23/2018  . IR EXCHANGE BILIARY DRAIN  02/17/2018  . IR EXCHANGE BILIARY DRAIN  03/20/2018  . IR EXCHANGE BILIARY DRAIN  04/17/2018  . IR EXCHANGE BILIARY DRAIN  05/29/2018  . IR EXCHANGE BILIARY DRAIN  07/24/2018  . IR EXCHANGE BILIARY DRAIN  09/22/2018  . IR EXCHANGE BILIARY DRAIN  11/20/2018  . IR EXCHANGE BILIARY DRAIN  02/11/2019  . IR EXCHANGE BILIARY DRAIN  04/14/2019  . IR EXCHANGE BILIARY DRAIN  06/09/2019  . IR EXCHANGE BILIARY DRAIN  11/26/2019  . IR EXCHANGE BILIARY DRAIN  12/23/2019  . IR EXCHANGE BILIARY DRAIN  02/22/2020  . IR EXCHANGE BILIARY DRAIN  05/25/2020  . IR EXCHANGE BILIARY DRAIN  07/29/2020  . IR EXCHANGE BILIARY DRAIN  08/30/2020  . IR EXCHANGE BILIARY DRAIN  10/06/2020  . IR EXCHANGE BILIARY DRAIN  11/18/2020  . IR EXCHANGE BILIARY DRAIN  12/21/2020  . IR EXCHANGE BILIARY DRAIN  02/03/2021  . IR EXCHANGE BILIARY DRAIN  03/07/2021  . IR EXCHANGE BILIARY DRAIN   05/04/2021  . IR EXCHANGE BILIARY DRAIN  06/22/2021  . IR EXCHANGE BILIARY DRAIN  07/04/2021  . IR EXCHANGE BILIARY DRAIN  08/10/2021  . IR PERC CHOLECYSTOSTOMY  01/27/2017  . IR PERC CHOLECYSTOSTOMY  11/18/2019  . IR RADIOLOGIST EVAL & MGMT  03/05/2017  . IR SINUS/FIST TUBE CHK-NON GI  10/06/2020  . MASTECTOMY, PARTIAL Right     Social History   Socioeconomic History  . Marital status: Widowed    Spouse name: Not on file  . Number of children: Not on file  . Years of education: Not on file  . Highest education level: Not on file  Occupational History  . Occupation: retired  Tobacco Use  . Smoking status: Never  . Smokeless tobacco: Never  Vaping Use  . Vaping Use: Never used  Substance and Sexual Activity  . Alcohol use: No  . Drug use: No  . Sexual activity: Not Currently  Other Topics Concern  . Not on file  Social History Narrative   Long term resident of [facility]    Social Determinants of Health   Financial Resource Strain: Low Risk  (07/21/2018)   Overall Financial Resource Strain (CARDIA)   . Difficulty of Paying Living Expenses: Not hard at all  Food Insecurity: No Food Insecurity (07/21/2018)   Hunger Vital Sign   . Worried About Charity fundraiser in the Last Year: Never true   . Ran Out of Food in the Last Year: Never true  Transportation Needs: No Transportation Needs (07/21/2018)   PRAPARE - Transportation   . Lack of Transportation (Medical): No   . Lack of Transportation (Non-Medical): No  Physical Activity: Inactive (07/21/2018)   Exercise Vital Sign   . Days of Exercise per Week: 0 days   . Minutes of Exercise per Session: 0 min  Stress: No Stress Concern Present (07/21/2018)   [place]   . Feeling of Stress : Not at all  Social Connections: Socially Isolated (07/21/2018)   Social Connection and Isolation Panel [NHANES]   . Frequency of Communication with Friends and Family: Never   .  Frequency of Social Gatherings with Friends and Family: Never   . Attends Religious Services: Never   . Active Member of Clubs or Organizations: No   . Attends Archivist Meetings: Never   . Marital Status: Widowed  Intimate Partner Violence: Not At Risk (07/21/2018)   Humiliation, Afraid, Rape, and Kick questionnaire   . Fear of Current or Ex-Partner: No   . Emotionally Abused: No   . Physically Abused: No   . Sexually Abused: No   Family History  Problem Relation Age of Onset  . Cancer Father        ? primary  . Diabetes Neg Hx   . Heart disease Neg Hx   . Stroke Neg Hx       VITAL SIGNS BP 123/70   Pulse 70   Temp 97.9 F (36.6 C)   Resp 20   Ht '5\' 3"'$  (1.6 m)   Wt 136 lb 12.8 oz (62.1 kg)   SpO2 99%   BMI 24.23 kg/m   Outpatient Encounter Medications as of 06/13/2022  Medication Sig  . Balsam [place]-Castor Oil (VENELEX) OINT Apply topically. Apply to bilateral buttocks qshift for erythema.  . calcium carbonate (TUMS - DOSED IN MG ELEMENTAL CALCIUM) 500 MG chewable tablet Chew 1 tablet by mouth daily.  . cholecalciferol (VITAMIN D) 25 MCG (1000 UNIT) tablet Take 1,000 Units by mouth daily.  . Cyanocobalamin (VITAMIN B-12) 1000 MCG SUBL Place 1 tablet under the tongue at bedtime.  . donepezil (ARICEPT) 10 MG tablet Take 1 tablet by mouth at bedtime.  Marland Kitchen LORazepam (ATIVAN) 0.5 MG tablet Take 0.5 mg by mouth daily as needed for anxiety.  . NON FORMULARY Diet: Dysphagia 3 thin liquids  . Nutritional Supplements (ENSURE ENLIVE PO) Take 1.5 kcal by mouth daily.  . polyethylene glycol (MIRALAX / GLYCOLAX) packet Take 17 g by mouth daily as needed for mild constipation or moderate constipation.    No facility-administered encounter medications on file as of 06/13/2022.     SIGNIFICANT DIAGNOSTIC EXAMS       ASSESSMENT/ PLAN:     Ok Edwards NP [facility] Adult Medicine  Contact [phone] Monday through Friday 8am- 5pm  After hours call [phone]    This encounter was created in error - please disregard.

## 2022-06-14 ENCOUNTER — Encounter: Payer: Self-pay | Admitting: Internal Medicine

## 2022-06-14 ENCOUNTER — Encounter: Payer: Self-pay | Admitting: Adult Health

## 2022-06-14 ENCOUNTER — Non-Acute Institutional Stay (SKILLED_NURSING_FACILITY): Payer: Medicare Other | Admitting: Internal Medicine

## 2022-06-14 ENCOUNTER — Non-Acute Institutional Stay: Payer: Self-pay | Admitting: Adult Health

## 2022-06-14 DIAGNOSIS — E034 Atrophy of thyroid (acquired): Secondary | ICD-10-CM | POA: Diagnosis not present

## 2022-06-14 DIAGNOSIS — I700 Atherosclerosis of aorta: Secondary | ICD-10-CM

## 2022-06-14 DIAGNOSIS — D649 Anemia, unspecified: Secondary | ICD-10-CM | POA: Diagnosis not present

## 2022-06-14 DIAGNOSIS — N182 Chronic kidney disease, stage 2 (mild): Secondary | ICD-10-CM | POA: Diagnosis not present

## 2022-06-14 DIAGNOSIS — F0150 Vascular dementia without behavioral disturbance: Secondary | ICD-10-CM

## 2022-06-14 DIAGNOSIS — E039 Hypothyroidism, unspecified: Secondary | ICD-10-CM | POA: Insufficient documentation

## 2022-06-14 DIAGNOSIS — E441 Mild protein-calorie malnutrition: Secondary | ICD-10-CM

## 2022-06-14 DIAGNOSIS — M810 Age-related osteoporosis without current pathological fracture: Secondary | ICD-10-CM

## 2022-06-14 NOTE — Assessment & Plan Note (Addendum)
Exam suggests protein/caloric malnutrition based on interosseous wasting and limb atrophy.  In February of this year albumin was 3.1 up from 3.0.  Total protein was normal at 7.8 up from prior values of 6.7.  She is on a protein supplement and Nutrition continues to follow her.

## 2022-06-14 NOTE — Addendum Note (Signed)
Addended by: [name] on: 06/14/2022 10:50 AM   Modules accepted: Orders, Level of Service

## 2022-06-14 NOTE — Progress Notes (Signed)
NURSING HOME LOCATION:  [facility] ROOM NUMBER:  141 D  CODE STATUS:  DNR  PCP:  Ok Edwards NP  This is a nursing facility follow up visit of chronic medical diagnoses & to document compliance with Regulation 483.30 (c) in The [place] Manual Phase 2 which mandates caregiver visit ( visits can alternate among physician, PA or NP as per statutes) within 10 days of 30 days / 60 days/ 90 days post admission to SNF date    Interim medical record and care since last SNF visit was updated with review of diagnostic studies and change in clinical status since last visit were documented.  HPI: She is a permanent resident of this facility with medical diagnoses of aortic atherosclerosis, history of breast cancer, essential hypertension, hypothyroidism, vitamin B deficiency, and advanced dementia. Significant surgeries and procedures include myriad interventions related to biliary drain placement post cholecystotomy and right partial mastectomy.  Most recent labs were performed almost 6 months ago.  Renal function revealed CKD stage II having improved from CKD stage IIIa.  The current creatinine is 0.88 and GFR greater than 60.  CBC and platelet count were normal.  Remotely she had exhibited mild hyperglycemia but this resolved.  There is no A1c on record.  Remotely she had mild hypothyroidism with a TSH as high as 5.568 in 2019 but TSH was therapeutic at 3.26 in April 2022.  Review of systems: Dementia invalidated responses.  Complete review of systems was negative.  Her only complaint was "I want out of here."  She went on to state that her memory is "not that good."  She then began to confabulate about my mustache being off setting.  When asked about her prior work experience she confabulated "I am not really retired."  She did work for a Company secretary in the office.  She then went on to say that her father had worked there for decades and in fact "still  works there."  She then began to confabulate about a Restaurant manager, fast food on the wall of [employer] as [employer].  She focused on the mountains in the background and could not tell me what the pitcher or represented or he was in the picture.  She suggested "don't trip on the mountain as you leave."  Constitutional: No fever, significant weight change, fatigue  Eyes: No redness, discharge, pain, vision change ENT/mouth: No nasal congestion,  purulent discharge, earache, change in hearing, sore throat  Cardiovascular: No chest pain, palpitations, paroxysmal nocturnal dyspnea, claudication, edema  Respiratory: No cough, sputum production, hemoptysis, DOE, significant snoring, apnea   Gastrointestinal: No heartburn, dysphagia, abdominal pain, nausea /vomiting, rectal bleeding, melena, change in bowels Genitourinary: No dysuria, hematuria, pyuria, incontinence, nocturia Musculoskeletal: No joint stiffness, joint swelling, weakness, pain Dermatologic: No rash, pruritus, change in appearance of skin Neurologic: No dizziness, headache, syncope, seizures, numbness, tingling Psychiatric: No significant anxiety, depression, insomnia, anorexia Endocrine: No change in hair/skin/nails, excessive thirst, excessive hunger, excessive urination  Hematologic/lymphatic: No significant bruising, lymphadenopathy, abnormal bleeding Allergy/immunology: No itchy/watery eyes, significant sneezing, urticaria, angioedema  Physical exam:  Pertinent or positive findings: She appears her age.  She was initially asleep and was exhibiting hypopnea without frank apnea or snoring.  Upon awakening she was startled.  Hair is disheveled.  Lower lids are puffy.  Dentition is intact.  She has a very faint slight basilar murmur.  Minor low-grade rales are present posteriorly.  Abdomen is protuberant.  She has trace  edema at the right sock line.  Dorsalis pedis pulses are stronger than posterior tibial pulses.  There is marked interosseous  wasting.  General appearance: no acute distress, increased work of breathing is present.   Lymphatic: No lymphadenopathy about the head, neck, axilla. Eyes: No conjunctival inflammation or lid edema is present. There is no scleral icterus. Ears:  External ear exam shows no significant lesions or deformities.   Nose:  External nasal examination shows no deformity or inflammation. Nasal mucosa are pink and moist without lesions, exudates Oral exam:   There is no oropharyngeal erythema or exudate. Neck:  No thyromegaly, masses, tenderness noted.    Heart:  Normal rate and regular rhythm. S1 and S2 normal without gallop, click, rub .  Lungs:  without wheezes, rhonchi, rubs. Abdomen: Bowel sounds are normal. Abdomen is soft and nontender with no organomegaly, hernias, masses. GU: Deferred  Extremities:  No cyanosis, clubbing, edema  Neurologic exam :Balance, Rhomberg, finger to nose testing could not be completed due to clinical state Skin: Warm & dry w/o tenting. No significant lesions or rash.  See summary under each active problem in the Problem List with associated updated therapeutic plan

## 2022-06-14 NOTE — Patient Instructions (Signed)
See assessment and plan under each diagnosis in the problem list and acutely for this visit 

## 2022-06-14 NOTE — Assessment & Plan Note (Signed)
No palpable abnormalities on thyroid exam.  Clinically she is euthyroid.  TSH can be updated with next labs.

## 2022-06-14 NOTE — Assessment & Plan Note (Signed)
She continues to deny any anginal type symptoms.  No change indicated in medication regimen.

## 2022-06-14 NOTE — Assessment & Plan Note (Signed)
CBC performed 6 months ago was normal.  No bleeding dyscrasias reported by her or staff.

## 2022-06-14 NOTE — Progress Notes (Signed)
Location:  [facility] Room Number: 141 Place of Service:  SNF (31)   CODE STATUS: dnr   No Known Allergies  Chief Complaint  Patient presents with   Osteoporosis    HPI:  Her t score is -2.464 demonstrating osteoporosis. There are no reports of recent falls. She has a history of C2 fracture. . She is wheelchair bound. She is presently taking calcium and vitamin D supplements.   Past Medical History:  Diagnosis Date   Aortic atherosclerosis ([place]) 11/15/2020   Breast cancer ([place])    Cognitive communication deficit    Dementia ([place])    Difficulty in walking(719.7)    Fall    HTN (hypertension) 08/26/2016   Muscle weakness (generalized)    Rash and nonspecific skin eruption    Thyroid disease    hypo   Urinary tract infection, site not specified    Vitamin B deficiency     Past Surgical History:  Procedure Laterality Date   COLON SURGERY     IR CATHETER TUBE CHANGE  05/07/2017   IR EXCHANGE BILIARY DRAIN  07/02/2017   IR EXCHANGE BILIARY DRAIN  07/10/2017   IR EXCHANGE BILIARY DRAIN  08/28/2017   IR EXCHANGE BILIARY DRAIN  10/11/2017   IR EXCHANGE BILIARY DRAIN  10/28/2017   IR EXCHANGE BILIARY DRAIN  12/23/2017   IR EXCHANGE BILIARY DRAIN  01/23/2018   IR EXCHANGE BILIARY DRAIN  02/17/2018   IR EXCHANGE BILIARY DRAIN  03/20/2018   IR EXCHANGE BILIARY DRAIN  04/17/2018   IR EXCHANGE BILIARY DRAIN  05/29/2018   IR EXCHANGE BILIARY DRAIN  07/24/2018   IR EXCHANGE BILIARY DRAIN  09/22/2018   IR EXCHANGE BILIARY DRAIN  11/20/2018   IR EXCHANGE BILIARY DRAIN  02/11/2019   IR EXCHANGE BILIARY DRAIN  04/14/2019   IR EXCHANGE BILIARY DRAIN  06/09/2019   IR EXCHANGE BILIARY DRAIN  11/26/2019   IR EXCHANGE BILIARY DRAIN  12/23/2019   IR EXCHANGE BILIARY DRAIN  02/22/2020   IR EXCHANGE BILIARY DRAIN  05/25/2020   IR EXCHANGE BILIARY DRAIN  07/29/2020   IR EXCHANGE BILIARY DRAIN  08/30/2020   IR EXCHANGE BILIARY DRAIN  10/06/2020   IR EXCHANGE BILIARY DRAIN  11/18/2020   IR  EXCHANGE BILIARY DRAIN  12/21/2020   IR EXCHANGE BILIARY DRAIN  02/03/2021   IR EXCHANGE BILIARY DRAIN  03/07/2021   IR EXCHANGE BILIARY DRAIN  05/04/2021   IR EXCHANGE BILIARY DRAIN  06/22/2021   IR EXCHANGE BILIARY DRAIN  07/04/2021   IR EXCHANGE BILIARY DRAIN  08/10/2021   IR PERC CHOLECYSTOSTOMY  01/27/2017   IR PERC CHOLECYSTOSTOMY  11/18/2019   IR RADIOLOGIST EVAL & MGMT  03/05/2017   IR SINUS/FIST TUBE CHK-NON GI  10/06/2020   MASTECTOMY, PARTIAL Right     Social History   Socioeconomic History   Marital status: Widowed    Spouse name: Not on file   Number of children: Not on file   Years of education: Not on file   Highest education level: Not on file  Occupational History   Occupation: retired  Tobacco Use   Smoking status: Never   Smokeless tobacco: Never  Vaping Use   Vaping Use: Never used  Substance and Sexual Activity   Alcohol use: No   Drug use: No   Sexual activity: Not Currently  Other Topics Concern   Not on file  Social History Narrative   [facility] resident of [facility]    Social Determinants  of Health   Financial Resource Strain: Low Risk  (07/21/2018)   Overall Financial Resource Strain (CARDIA)    Difficulty of Paying Living Expenses: Not hard at all  Food Insecurity: No Food Insecurity (07/21/2018)   Hunger Vital Sign    Worried About Running Out of Food in the Last Year: Never true    Ran Out of Food in the Last Year: Never true  Transportation Needs: No Transportation Needs (07/21/2018)   PRAPARE - Hydrologist (Medical): No    Lack of Transportation (Non-Medical): No  Physical Activity: Inactive (07/21/2018)   Exercise Vital Sign    Days of Exercise per Week: 0 days    Minutes of Exercise per Session: 0 min  Stress: No Stress Concern Present (07/21/2018)   [place]    Feeling of Stress : Not at all  Social Connections: Socially Isolated (07/21/2018)   Social  Connection and Isolation Panel [NHANES]    Frequency of Communication with Friends and Family: Never    Frequency of Social Gatherings with Friends and Family: Never    Attends Religious Services: Never    Marine scientist or Organizations: No    Attends Archivist Meetings: Never    Marital Status: Widowed  Intimate Partner Violence: Not At Risk (07/21/2018)   Humiliation, Afraid, Rape, and Kick questionnaire    Fear of Current or Ex-Partner: No    Emotionally Abused: No    Physically Abused: No    Sexually Abused: No   Family History  Problem Relation Age of Onset   Cancer Father        ? primary   Diabetes Neg Hx    Heart disease Neg Hx    Stroke Neg Hx       VITAL SIGNS BP 138/75   Pulse 82   Temp 97.8 F (36.6 C)   Resp 18   Ht '5\' 3"'$  (1.6 m)   Wt 136 lb 12.8 oz (62.1 kg)   SpO2 98%   BMI 24.23 kg/m   Outpatient Encounter Medications as of 06/14/2022  Medication Sig   Balsam [place]-Castor Oil (VENELEX) OINT Apply topically. Apply to bilateral buttocks qshift for erythema.   calcium carbonate (TUMS - DOSED IN MG ELEMENTAL CALCIUM) 500 MG chewable tablet Chew 1 tablet by mouth daily.   cholecalciferol (VITAMIN D) 25 MCG (1000 UNIT) tablet Take 1,000 Units by mouth daily.   Cyanocobalamin (VITAMIN B-12) 1000 MCG SUBL Place 1 tablet under the tongue at bedtime.   donepezil (ARICEPT) 10 MG tablet Take 1 tablet by mouth at bedtime.   LORazepam (ATIVAN) 0.5 MG tablet Take 0.5 mg by mouth daily as needed for anxiety.   NON FORMULARY Diet: Dysphagia 3 thin liquids   Nutritional Supplements (ENSURE ENLIVE PO) Take 1.5 kcal by mouth daily.   polyethylene glycol (MIRALAX / GLYCOLAX) packet Take 17 g by mouth daily as needed for mild constipation or moderate constipation.    No facility-administered encounter medications on file as of 06/14/2022.     SIGNIFICANT DIAGNOSTIC EXAMS   DIAGNOSTIC EXAMS  PREVIOUS   12-13-21: ct of abdomen and pelvis   Diverticulosis of colon. There is wall thickening along with pericolic stranding at the junction of descending and sigmoid colon in the left iliac fossa consistent with acute diverticulitis. There is no loculated pericolic abscess. There is gallbladder wall thickening along with gallbladder stones. Gallbladder is not distended. Findings may suggest  chronic cholecystitis. If there is clinical suspicion for acute cholecystitis, gallbladder sonogram may be considered. There is no hydronephrosis. Right kidney is small in size with multiple foci of cortical thinning suggesting chronic pyelonephritis. Small hiatal hernia.  TODAY  06-06-22: dexa t-score: -2.464   LABS REVIEWED: PREVIOUS:   07-27-21: wbc 5.1; hgb 12.0; hct 36.7; mcv 101.1 plt 281; glucose 86; bun 16; creat 0.74; k+ 3.9; na++ 136; ca 8.4 GFR>60; liver normal albumin 3.0  12-13-21: wbc 17.4;hgb 11.8; hct 36.8; mcv98.4 plt 361; glucose 132; bun 17; creat 0.77; k+ 4.2; na++ 137; ca 8.7; GFR 53 protein 7.8; albumin 3.1 blood culture: no growth 12-16-21: wbc 7.4; hgb 11.0; hct 35.6;mcv 98.3 plt 358; glucose 93; bun 10; creat 0.94; k+ 3.5; na++ 139; ca 8.8; GFR 56  12-26-21: wbc 6.4; hgb 12.4; hct 39.1; mcv 99.5 plt 397; glucose 80; bun 15; creat 0.88; k+ 4.0; na++ 139; ca 9.2; GFR>60   NO NEW LABS.    Review of Systems  Constitutional:  Negative for malaise/fatigue.  Respiratory:  Negative for cough and shortness of breath.   Cardiovascular:  Negative for chest pain, palpitations and leg swelling.  Gastrointestinal:  Negative for abdominal pain, constipation and heartburn.  Musculoskeletal:  Negative for back pain, joint pain and myalgias.  Skin: Negative.   Neurological:  Negative for dizziness.  Psychiatric/Behavioral:  The patient is not nervous/anxious.    Physical Exam Constitutional:      General: She is not in acute distress.    Appearance: She is well-developed. She is not diaphoretic.  Neck:     Thyroid: No thyromegaly.   Cardiovascular:     Rate and Rhythm: Normal rate and regular rhythm.     Pulses: Normal pulses.     Heart sounds: Normal heart sounds.  Pulmonary:     Effort: Pulmonary effort is normal. No respiratory distress.     Breath sounds: Normal breath sounds.  Chest:     Comments: Right partial mastectomy Abdominal:     General: Bowel sounds are normal. There is no distension.     Palpations: Abdomen is soft.     Tenderness: There is no abdominal tenderness.     Comments: Chole drain out   Musculoskeletal:        General: Normal range of motion.     Cervical back: Neck supple.     Right lower leg: No edema.     Left lower leg: No edema.  Lymphadenopathy:     Cervical: No cervical adenopathy.  Skin:    General: Skin is warm and dry.  Neurological:     Mental Status: She is alert. Mental status is at baseline.  Psychiatric:        Mood and Affect: Mood normal.       ASSESSMENT/ PLAN:   TODAY  Post menopausal osteoporosis: t score -2.464 will continue vitamin supplements; will begin fosamax 70 mg weekly   Ok Edwards NP [facility]   call [phone]

## 2022-06-14 NOTE — Assessment & Plan Note (Signed)
Please see the entry of 06/14/2022 demonstrating severe dementia manifested as confabulation and slight paranoia.  Behavior does not warrant any change in present regimen.

## 2022-06-14 NOTE — Assessment & Plan Note (Signed)
CKD is now stage II with creatinine 0.88 and GFR greater than 60; previously it was stage IIIa.  No indication for medication change.

## 2022-06-14 NOTE — Progress Notes (Signed)
This encounter was created in error - please disregard.

## 2022-07-24 DIAGNOSIS — Z23 Encounter for immunization: Secondary | ICD-10-CM | POA: Diagnosis not present

## 2022-07-26 ENCOUNTER — Non-Acute Institutional Stay (SKILLED_NURSING_FACILITY): Payer: Medicare Other | Admitting: Adult Health

## 2022-07-26 ENCOUNTER — Encounter: Payer: Self-pay | Admitting: Adult Health

## 2022-07-26 DIAGNOSIS — K8061 Calculus of gallbladder and bile duct with cholecystitis, unspecified, with obstruction: Secondary | ICD-10-CM | POA: Diagnosis not present

## 2022-07-26 DIAGNOSIS — I10 Essential (primary) hypertension: Secondary | ICD-10-CM

## 2022-07-26 DIAGNOSIS — K5909 Other constipation: Secondary | ICD-10-CM | POA: Diagnosis not present

## 2022-07-26 NOTE — Progress Notes (Signed)
Location:  [facility] Room Number: 141/D Place of Service:  SNF (31)   CODE STATUS: dnr   No Known Allergies  Chief Complaint  Patient presents with   Medical Management of Chronic Issues                           Primary hypertension: Calculus of gall bladder without acute cholecystitis and obstruction:  Chronic constipation    HPI:  Cynthia [name] is a 88 year old long term resident of this facility being seen for the management of her chronic illnesses:  Primary hypertension: Calculus of gall bladder without acute cholecystitis and obstruction:  Chronic constipation. There are no reports of uncontrolled pain. Cynthia [name] is tolerating having her chole drain out. There are no reports of anxiety or agitation.   Past Medical History:  Diagnosis Date   Aortic atherosclerosis ([place]) 11/15/2020   Breast cancer ([place])    Cognitive communication deficit    Dementia ([place])    Difficulty in walking(719.7)    Fall    HTN (hypertension) 08/26/2016   Muscle weakness (generalized)    Rash and nonspecific skin eruption    Thyroid disease    hypo   Urinary tract infection, site not specified    Vitamin B deficiency     Past Surgical History:  Procedure Laterality Date   COLON SURGERY     IR CATHETER TUBE CHANGE  05/07/2017   IR EXCHANGE BILIARY DRAIN  07/02/2017   IR EXCHANGE BILIARY DRAIN  07/10/2017   IR EXCHANGE BILIARY DRAIN  08/28/2017   IR EXCHANGE BILIARY DRAIN  10/11/2017   IR EXCHANGE BILIARY DRAIN  10/28/2017   IR EXCHANGE BILIARY DRAIN  12/23/2017   IR EXCHANGE BILIARY DRAIN  01/23/2018   IR EXCHANGE BILIARY DRAIN  02/17/2018   IR EXCHANGE BILIARY DRAIN  03/20/2018   IR EXCHANGE BILIARY DRAIN  04/17/2018   IR EXCHANGE BILIARY DRAIN  05/29/2018   IR EXCHANGE BILIARY DRAIN  07/24/2018   IR EXCHANGE BILIARY DRAIN  09/22/2018   IR EXCHANGE BILIARY DRAIN  11/20/2018   IR EXCHANGE BILIARY DRAIN  02/11/2019   IR EXCHANGE BILIARY DRAIN  04/14/2019   IR EXCHANGE BILIARY DRAIN  06/09/2019    IR EXCHANGE BILIARY DRAIN  11/26/2019   IR EXCHANGE BILIARY DRAIN  12/23/2019   IR EXCHANGE BILIARY DRAIN  02/22/2020   IR EXCHANGE BILIARY DRAIN  05/25/2020   IR EXCHANGE BILIARY DRAIN  07/29/2020   IR EXCHANGE BILIARY DRAIN  08/30/2020   IR EXCHANGE BILIARY DRAIN  10/06/2020   IR EXCHANGE BILIARY DRAIN  11/18/2020   IR EXCHANGE BILIARY DRAIN  12/21/2020   IR EXCHANGE BILIARY DRAIN  02/03/2021   IR EXCHANGE BILIARY DRAIN  03/07/2021   IR EXCHANGE BILIARY DRAIN  05/04/2021   IR EXCHANGE BILIARY DRAIN  06/22/2021   IR EXCHANGE BILIARY DRAIN  07/04/2021   IR EXCHANGE BILIARY DRAIN  08/10/2021   IR PERC CHOLECYSTOSTOMY  01/27/2017   IR PERC CHOLECYSTOSTOMY  11/18/2019   IR RADIOLOGIST EVAL & MGMT  03/05/2017   IR SINUS/FIST TUBE CHK-NON GI  10/06/2020   MASTECTOMY, PARTIAL Right     Social History   Socioeconomic History   Marital status: Widowed    Spouse name: Not on file   Number of children: Not on file   Years of education: Not on file   Highest education level: Not on file  Occupational History   Occupation: retired  Tobacco Use   Smoking status: Never   Smokeless tobacco: Never  Vaping Use   Vaping Use: Never used  Substance and Sexual Activity   Alcohol use: No   Drug use: No   Sexual activity: Not Currently  Other Topics Concern   Not on file  Social History Narrative   Long term resident of [facility]    Social Determinants of Health   Financial Resource Strain: Low Risk  (07/21/2018)   Overall Financial Resource Strain (CARDIA)    Difficulty of Paying Living Expenses: Not hard at all  Food Insecurity: No Food Insecurity (07/21/2018)   Hunger Vital Sign    Worried About Running Out of Food in the Last Year: Never true    Ran Out of Food in the Last Year: Never true  Transportation Needs: No Transportation Needs (07/21/2018)   PRAPARE - Hydrologist (Medical): No    Lack of Transportation (Non-Medical): No  Physical Activity: Inactive (07/21/2018)    Exercise Vital Sign    Days of Exercise per Week: 0 days    Minutes of Exercise per Session: 0 min  Stress: No Stress Concern Present (07/21/2018)   [place]    Feeling of Stress : Not at all  Social Connections: Socially Isolated (07/21/2018)   Social Connection and Isolation Panel [NHANES]    Frequency of Communication with Friends and Family: Never    Frequency of Social Gatherings with Friends and Family: Never    Attends Religious Services: Never    Marine scientist or Organizations: No    Attends Archivist Meetings: Never    Marital Status: Widowed  Intimate Partner Violence: Not At Risk (07/21/2018)   Humiliation, Afraid, Rape, and Kick questionnaire    Fear of Current or Ex-Partner: No    Emotionally Abused: No    Physically Abused: No    Sexually Abused: No   Family History  Problem Relation Age of Onset   Cancer Father        ? primary   Diabetes Neg Hx    Heart disease Neg Hx    Stroke Neg Hx       VITAL SIGNS BP 125/76   Pulse 83   Temp 97.6 F (36.4 C)   Resp 20   Ht '5\' 3"'$  (1.6 m)   Wt 136 lb 6.4 oz (61.9 kg)   SpO2 96%   BMI 24.16 kg/m   Outpatient Encounter Medications as of 07/26/2022  Medication Sig   alendronate (FOSAMAX) 70 MG tablet Take 70 mg by mouth once a week. Take with a full glass of water on an empty stomach.   Balsam [place]-Castor Oil (VENELEX) OINT Apply topically. Apply to bilateral buttocks qshift for erythema.   calcium carbonate (TUMS - DOSED IN MG ELEMENTAL CALCIUM) 500 MG chewable tablet Chew 1 tablet by mouth daily.   cholecalciferol (VITAMIN D) 25 MCG (1000 UNIT) tablet Take 1,000 Units by mouth daily.   Cyanocobalamin (VITAMIN B-12) 1000 MCG SUBL Place 1 tablet under the tongue at bedtime.   donepezil (ARICEPT) 10 MG tablet Take 1 tablet by mouth at bedtime.   LORazepam (ATIVAN) 0.5 MG tablet Take 0.5 mg by mouth daily as needed for anxiety.   NON  FORMULARY Diet: Dysphagia 3 thin liquids   Nutritional Supplements (ENSURE ENLIVE PO) Take 1.5 kcal by mouth daily.   polyethylene glycol (MIRALAX / GLYCOLAX) packet Take 17 g by mouth  daily as needed for mild constipation or moderate constipation.    No facility-administered encounter medications on file as of 07/26/2022.     SIGNIFICANT DIAGNOSTIC EXAMS  DIAGNOSTIC EXAMS  PREVIOUS   12-13-21: ct of abdomen and pelvis  Diverticulosis of colon. There is wall thickening along with pericolic stranding at the junction of descending and sigmoid colon in the left iliac fossa consistent with acute diverticulitis. There is no loculated pericolic abscess. There is gallbladder wall thickening along with gallbladder stones. Gallbladder is not distended. Findings may suggest chronic cholecystitis. If there is clinical suspicion for acute cholecystitis, gallbladder sonogram may be considered. There is no hydronephrosis. Right kidney is small in size with multiple foci of cortical thinning suggesting chronic pyelonephritis. Small hiatal hernia.  NO NEW EXAMS    LABS REVIEWED: PREVIOUS:   07-27-21: wbc 5.1; hgb 12.0; hct 36.7; mcv 101.1 plt 281; glucose 86; bun 16; creat 0.74; k+ 3.9; na++ 136; ca 8.4 GFR>60; liver normal albumin 3.0  12-13-21: wbc 17.4;hgb 11.8; hct 36.8; mcv98.4 plt 361; glucose 132; bun 17; creat 0.77; k+ 4.2; na++ 137; ca 8.7; GFR 53 protein 7.8; albumin 3.1 blood culture: no growth 12-16-21: wbc 7.4; hgb 11.0; hct 35.6;mcv 98.3 plt 358; glucose 93; bun 10; creat 0.94; k+ 3.5; na++ 139; ca 8.8; GFR 56  12-26-21: wbc 6.4; hgb 12.4; hct 39.1; mcv 99.5 plt 397; glucose 80; bun 15; creat 0.88; k+ 4.0; na++ 139; ca 9.2; GFR>60   NO NEW LABS.    Review of Systems  Constitutional:  Negative for malaise/fatigue.  Respiratory:  Negative for cough and shortness of breath.   Cardiovascular:  Negative for chest pain, palpitations and leg swelling.  Gastrointestinal:  Negative for abdominal  pain, constipation and heartburn.  Musculoskeletal:  Negative for back pain, joint pain and myalgias.  Skin: Negative.   Neurological:  Negative for dizziness.  Psychiatric/Behavioral:  The patient is not nervous/anxious.    Physical Exam Constitutional:      General: Cynthia [name] is not in acute distress.    Appearance: Cynthia [name] is well-developed. Cynthia [name] is not diaphoretic.  Neck:     Thyroid: No thyromegaly.  Cardiovascular:     Rate and Rhythm: Normal rate and regular rhythm.     Pulses: Normal pulses.     Heart sounds: Normal heart sounds.  Pulmonary:     Effort: Pulmonary effort is normal. No respiratory distress.     Breath sounds: Normal breath sounds.  Chest:     Comments: Right partial mastectomy  Abdominal:     General: Bowel sounds are normal. There is no distension.     Palpations: Abdomen is soft.     Tenderness: There is no abdominal tenderness.     Comments: Chole drain out   Musculoskeletal:        General: Normal range of motion.     Cervical back: Neck supple.     Right lower leg: No edema.     Left lower leg: No edema.  Lymphadenopathy:     Cervical: No cervical adenopathy.  Skin:    General: Skin is warm and dry.  Neurological:     Mental Status: Cynthia [name] is alert. Mental status is at baseline.  Psychiatric:        Mood and Affect: Mood normal.           ASSESSMENT/ PLAN:  TODAY  Primary hypertension: b/p 125/76  2. Calculus of gall bladder without acute cholecystitis and obstruction: chole drain is out at this time;  will monitor   3. Chronic constipation: will continue miralax daily as needed   PREVIOUS   4. Herpes: without breakout: her valtrex has been stopped will monitor   5. Protein calorie malnutrition: albumin 3.1 protein 7.8; will continue supplements as directed.   6. Chronic anemia: hgb 12.4 will monitor   7. Aortic atherosclerosis (ct 08-26-19) due to advanced age is not on statin.   8. Major neurocognitive disorder/vascular dementia without  behavioral disturbance; weight is 136 pounds; weight is stable will continue aricept 10 mg daily     Ok Edwards NP [facility]   call [phone]

## 2022-07-30 ENCOUNTER — Other Ambulatory Visit (HOSPITAL_COMMUNITY)
Admission: RE | Admit: 2022-07-30 | Discharge: 2022-07-30 | Disposition: A | Discharge status: Home / Self Care | Payer: Medicare Other | Source: Skilled Nursing Facility | Attending: Adult Health | Admitting: Adult Health

## 2022-07-30 DIAGNOSIS — F0390 Unspecified dementia without behavioral disturbance: Secondary | ICD-10-CM | POA: Diagnosis present

## 2022-07-30 DIAGNOSIS — E559 Vitamin D deficiency, unspecified: Secondary | ICD-10-CM | POA: Diagnosis not present

## 2022-07-30 DIAGNOSIS — Z1159 Encounter for screening for other viral diseases: Secondary | ICD-10-CM | POA: Diagnosis not present

## 2022-07-30 LAB — COMPREHENSIVE METABOLIC PANEL
ALT: 9 U/L (ref 0–44)
AST: 15 U/L (ref 15–41)
Albumin: 3 g/dL — ABNORMAL LOW (ref 3.5–5.0)
Alkaline Phosphatase: 61 U/L (ref 38–126)
Anion gap: 7 (ref 5–15)
BUN: 16 mg/dL (ref 8–23)
CO2: 27 mmol/L (ref 22–32)
Calcium: 8.5 mg/dL — ABNORMAL LOW (ref 8.9–10.3)
Chloride: 108 mmol/L (ref 98–111)
Creatinine, Ser: 0.81 mg/dL (ref 0.44–1.00)
GFR, Estimated: >60 mL/min (ref >60)
Glucose, Bld: 77 mg/dL (ref 70–99)
Potassium: 4.1 mmol/L (ref 3.5–5.1)
Sodium: 142 mmol/L (ref 135–145)
Total Bilirubin: 0.6 mg/dL (ref 0.3–1.2)
Total Protein: 6.5 g/dL (ref 6.5–8.1)

## 2022-07-30 LAB — VITAMIN D 25 HYDROXY (VIT D DEFICIENCY, FRACTURES): Vit D, 25-Hydroxy: 51.61 ng/mL (ref 30–100)

## 2022-07-30 LAB — CBC
HCT: 37.4 % (ref 36.0–46.0)
Hemoglobin: 11.9 g/dL — ABNORMAL LOW (ref 12.0–15.0)
MCH: 31.2 pg (ref 26.0–34.0)
MCHC: 31.8 g/dL (ref 30.0–36.0)
MCV: 97.9 fL (ref 80.0–100.0)
Platelets: 262 10*3/uL (ref 150–400)
RBC: 3.82 MIL/uL — ABNORMAL LOW (ref 3.87–5.11)
RDW: 15.9 % — ABNORMAL HIGH (ref 11.5–15.5)
WBC: 4.9 10*3/uL (ref 4.0–10.5)
nRBC: 0 % (ref 0.0–0.2)

## 2022-07-30 LAB — VITAMIN B12: Vitamin B-12: 636 pg/mL (ref 180–914)

## 2022-08-01 DIAGNOSIS — Z1159 Encounter for screening for other viral diseases: Secondary | ICD-10-CM | POA: Diagnosis not present

## 2022-08-09 ENCOUNTER — Encounter: Payer: Self-pay | Admitting: Adult Health

## 2022-08-09 ENCOUNTER — Non-Acute Institutional Stay (SKILLED_NURSING_FACILITY): Payer: Medicare Other | Admitting: Adult Health

## 2022-08-09 DIAGNOSIS — U071 COVID-19: Secondary | ICD-10-CM | POA: Diagnosis not present

## 2022-08-09 DIAGNOSIS — R059 Cough, unspecified: Secondary | ICD-10-CM | POA: Diagnosis not present

## 2022-08-09 DIAGNOSIS — R768 Other specified abnormal immunological findings in serum: Secondary | ICD-10-CM

## 2022-08-09 NOTE — Progress Notes (Signed)
Location:  [facility] Room Number: 141 Place of Service:  SNF (31) Provider:  Ok Edwards, NP  CODE STATUS: DNR  No Known Allergies  Chief Complaint  Patient presents with   Acute Visit    Positive COVID    HPI:  She has tested positive for COVID. She does have a cough and reports worsening fatigue. She denies any boy aches or pain. There are no reports of fevers present.   Past Medical History:  Diagnosis Date   Aortic atherosclerosis ([place]) 11/15/2020   Breast cancer ([place])    Cognitive communication deficit    Dementia ([place])    Difficulty in walking(719.7)    Fall    HTN (hypertension) 08/26/2016   Muscle weakness (generalized)    Rash and nonspecific skin eruption    Thyroid disease    hypo   Urinary tract infection, site not specified    Vitamin B deficiency     Past Surgical History:  Procedure Laterality Date   COLON SURGERY     IR CATHETER TUBE CHANGE  05/07/2017   IR EXCHANGE BILIARY DRAIN  07/02/2017   IR EXCHANGE BILIARY DRAIN  07/10/2017   IR EXCHANGE BILIARY DRAIN  08/28/2017   IR EXCHANGE BILIARY DRAIN  10/11/2017   IR EXCHANGE BILIARY DRAIN  10/28/2017   IR EXCHANGE BILIARY DRAIN  12/23/2017   IR EXCHANGE BILIARY DRAIN  01/23/2018   IR EXCHANGE BILIARY DRAIN  02/17/2018   IR EXCHANGE BILIARY DRAIN  03/20/2018   IR EXCHANGE BILIARY DRAIN  04/17/2018   IR EXCHANGE BILIARY DRAIN  05/29/2018   IR EXCHANGE BILIARY DRAIN  07/24/2018   IR EXCHANGE BILIARY DRAIN  09/22/2018   IR EXCHANGE BILIARY DRAIN  11/20/2018   IR EXCHANGE BILIARY DRAIN  02/11/2019   IR EXCHANGE BILIARY DRAIN  04/14/2019   IR EXCHANGE BILIARY DRAIN  06/09/2019   IR EXCHANGE BILIARY DRAIN  11/26/2019   IR EXCHANGE BILIARY DRAIN  12/23/2019   IR EXCHANGE BILIARY DRAIN  02/22/2020   IR EXCHANGE BILIARY DRAIN  05/25/2020   IR EXCHANGE BILIARY DRAIN  07/29/2020   IR EXCHANGE BILIARY DRAIN  08/30/2020   IR EXCHANGE BILIARY DRAIN  10/06/2020   IR EXCHANGE BILIARY DRAIN  11/18/2020   IR  EXCHANGE BILIARY DRAIN  12/21/2020   IR EXCHANGE BILIARY DRAIN  02/03/2021   IR EXCHANGE BILIARY DRAIN  03/07/2021   IR EXCHANGE BILIARY DRAIN  05/04/2021   IR EXCHANGE BILIARY DRAIN  06/22/2021   IR EXCHANGE BILIARY DRAIN  07/04/2021   IR EXCHANGE BILIARY DRAIN  08/10/2021   IR PERC CHOLECYSTOSTOMY  01/27/2017   IR PERC CHOLECYSTOSTOMY  11/18/2019   IR RADIOLOGIST EVAL & MGMT  03/05/2017   IR SINUS/FIST TUBE CHK-NON GI  10/06/2020   MASTECTOMY, PARTIAL Right     Social History   Socioeconomic History   Marital status: Widowed    Spouse name: Not on file   Number of children: Not on file   Years of education: Not on file   Highest education level: Not on file  Occupational History   Occupation: retired  Tobacco Use   Smoking status: Never   Smokeless tobacco: Never  Vaping Use   Vaping Use: Never used  Substance and Sexual Activity   Alcohol use: No   Drug use: No   Sexual activity: Not Currently  Other Topics Concern   Not on file  Social History Narrative   Long term resident of [facility]  Social Determinants of Health   Financial Resource Strain: Low Risk  (07/21/2018)   Overall Financial Resource Strain (CARDIA)    Difficulty of Paying Living Expenses: Not hard at all  Food Insecurity: No Food Insecurity (07/21/2018)   Hunger Vital Sign    Worried About Running Out of Food in the Last Year: Never true    Ran Out of Food in the Last Year: Never true  Transportation Needs: No Transportation Needs (07/21/2018)   PRAPARE - Hydrologist (Medical): No    Lack of Transportation (Non-Medical): No  Physical Activity: Inactive (07/21/2018)   Exercise Vital Sign    Days of Exercise per Week: 0 days    Minutes of Exercise per Session: 0 min  Stress: No Stress Concern Present (07/21/2018)   [place]    Feeling of Stress : Not at all  Social Connections: Socially Isolated (07/21/2018)   Social  Connection and Isolation Panel [NHANES]    Frequency of Communication with Friends and Family: Never    Frequency of Social Gatherings with Friends and Family: Never    Attends Religious Services: Never    Marine scientist or Organizations: No    Attends Archivist Meetings: Never    Marital Status: Widowed  Intimate Partner Violence: Not At Risk (07/21/2018)   Humiliation, Afraid, Rape, and Kick questionnaire    Fear of Current or Ex-Partner: No    Emotionally Abused: No    Physically Abused: No    Sexually Abused: No   Family History  Problem Relation Age of Onset   Cancer Father        ? primary   Diabetes Neg Hx    Heart disease Neg Hx    Stroke Neg Hx       VITAL SIGNS BP (!) 167/69   Pulse 95   Temp 99.3 F (37.4 C)   Resp (!) 22   Ht '5\' 3"'$  (1.6 m)   Wt 136 lb 6.4 oz (61.9 kg)   SpO2 96%   BMI 24.16 kg/m   Outpatient Encounter Medications as of 08/09/2022  Medication Sig   acetaminophen (TYLENOL) 325 MG tablet Take 650 mg by mouth every 6 (six) hours as needed.   alendronate (FOSAMAX) 70 MG tablet Take 70 mg by mouth once a week. Take with a full glass of water on an empty stomach.   ascorbic acid (VITAMIN C) 500 MG tablet Take 500 mg by mouth 2 (two) times daily. For two weeks   Balsam [place]-Castor Oil (VENELEX) OINT Apply topically. Apply to bilateral buttocks qshift for erythema.   calcium carbonate (TUMS - DOSED IN MG ELEMENTAL CALCIUM) 500 MG chewable tablet Chew 1 tablet by mouth daily.   cholecalciferol (VITAMIN D) 25 MCG (1000 UNIT) tablet Take 1,000 Units by mouth daily.   Cyanocobalamin (VITAMIN B-12) 1000 MCG SUBL Place 1 tablet under the tongue at bedtime.   donepezil (ARICEPT) 10 MG tablet Take 1 tablet by mouth at bedtime.   LORazepam (ATIVAN) 0.5 MG tablet Take 0.5 mg by mouth daily as needed for anxiety.   NON FORMULARY Diet: Dysphagia 3 thin liquids   Nutritional Supplements (ENSURE ENLIVE PO) Take 1.5 kcal by mouth daily.    Throat Lozenges (ZINC W/A&C) LOZG Use as directed in the mouth or throat. 5 times per day   [START ON 08/10/2022] Vitamin D, Ergocalciferol, (DRISDOL) 1.25 MG (50000 UNIT) CAPS capsule Take 50,000 Units  by mouth every 7 (seven) days.   [DISCONTINUED] polyethylene glycol (MIRALAX / GLYCOLAX) packet Take 17 g by mouth daily as needed for mild constipation or moderate constipation.    No facility-administered encounter medications on file as of 08/09/2022.     SIGNIFICANT DIAGNOSTIC EXAMS  PREVIOUS   12-13-21: ct of abdomen and pelvis  Diverticulosis of colon. There is wall thickening along with pericolic stranding at the junction of descending and sigmoid colon in the left iliac fossa consistent with acute diverticulitis. There is no loculated pericolic abscess. There is gallbladder wall thickening along with gallbladder stones. Gallbladder is not distended. Findings may suggest chronic cholecystitis. If there is clinical suspicion for acute cholecystitis, gallbladder sonogram may be considered. There is no hydronephrosis. Right kidney is small in size with multiple foci of cortical thinning suggesting chronic pyelonephritis. Small hiatal hernia.  NO NEW EXAMS    LABS REVIEWED: PREVIOUS:   07-27-21: wbc 5.1; hgb 12.0; hct 36.7; mcv 101.1 plt 281; glucose 86; bun 16; creat 0.74; k+ 3.9; na++ 136; ca 8.4 GFR>60; liver normal albumin 3.0  12-13-21: wbc 17.4;hgb 11.8; hct 36.8; mcv98.4 plt 361; glucose 132; bun 17; creat 0.77; k+ 4.2; na++ 137; ca 8.7; GFR 53 protein 7.8; albumin 3.1 blood culture: no growth 12-16-21: wbc 7.4; hgb 11.0; hct 35.6;mcv 98.3 plt 358; glucose 93; bun 10; creat 0.94; k+ 3.5; na++ 139; ca 8.8; GFR 56  12-26-21: wbc 6.4; hgb 12.4; hct 39.1; mcv 99.5 plt 397; glucose 80; bun 15; creat 0.88; k+ 4.0; na++ 139; ca 9.2; GFR>60   NO NEW LABS.    Review of Systems  Constitutional:  Positive for malaise/fatigue.  Respiratory:  Positive for cough. Negative for shortness of breath.    Cardiovascular:  Negative for chest pain, palpitations and leg swelling.  Gastrointestinal:  Negative for abdominal pain, constipation and heartburn.  Musculoskeletal:  Negative for back pain, joint pain and myalgias.  Skin: Negative.   Neurological:  Negative for dizziness.  Psychiatric/Behavioral:  The patient is not nervous/anxious.    Physical Exam Constitutional:      General: She is not in acute distress.    Appearance: She is well-developed. She is not diaphoretic.  Neck:     Thyroid: No thyromegaly.  Cardiovascular:     Rate and Rhythm: Normal rate and regular rhythm.     Heart sounds: Normal heart sounds.  Pulmonary:     Effort: Pulmonary effort is normal. No respiratory distress.     Breath sounds: Normal breath sounds.  Chest:     Comments: Right partial mastectomy Abdominal:     General: Bowel sounds are normal. There is no distension.     Palpations: Abdomen is soft.     Tenderness: There is no abdominal tenderness.     Comments: Chole drain out   Musculoskeletal:        General: Normal range of motion.     Cervical back: Neck supple.     Right lower leg: No edema.     Left lower leg: No edema.  Lymphadenopathy:     Cervical: No cervical adenopathy.  Skin:    General: Skin is warm and dry.  Neurological:     Mental Status: She is alert. Mental status is at baseline.  Psychiatric:        Mood and Affect: Mood normal.       ASSESSMENT/ PLAN:  TODAY  SARS CoV-2 positive: will begin vitamin c, d, zinc for 2 weeks will begin molnupiravir 800  mg twice daily for 5 days; will get chest x-ray; cbc; bmp; d-dimer and crp.    Ok Edwards NP [facility]  call [phone]

## 2022-08-10 ENCOUNTER — Encounter: Payer: Self-pay | Admitting: Adult Health

## 2022-08-10 ENCOUNTER — Non-Acute Institutional Stay (SKILLED_NURSING_FACILITY): Payer: Medicare Other | Admitting: Adult Health

## 2022-08-10 DIAGNOSIS — I700 Atherosclerosis of aorta: Secondary | ICD-10-CM | POA: Diagnosis not present

## 2022-08-10 DIAGNOSIS — F0390 Unspecified dementia without behavioral disturbance: Secondary | ICD-10-CM

## 2022-08-10 DIAGNOSIS — F0150 Vascular dementia without behavioral disturbance: Secondary | ICD-10-CM

## 2022-08-10 NOTE — Progress Notes (Unsigned)
Location:  [facility] Room Number: NO/140/D Place of Service:  SNF (31)   CODE STATUS: DNR  No Known Allergies  Chief Complaint  Patient presents with   Acute Visit    Patient is  covid positive    HPI:    Past Medical History:  Diagnosis Date   Aortic atherosclerosis ([place]) 11/15/2020   Breast cancer ([place])    Cognitive communication deficit    Dementia ([place])    Difficulty in walking(719.7)    Fall    HTN (hypertension) 08/26/2016   Muscle weakness (generalized)    Rash and nonspecific skin eruption    Thyroid disease    hypo   Urinary tract infection, site not specified    Vitamin B deficiency     Past Surgical History:  Procedure Laterality Date   COLON SURGERY     IR CATHETER TUBE CHANGE  05/07/2017   IR EXCHANGE BILIARY DRAIN  07/02/2017   IR EXCHANGE BILIARY DRAIN  07/10/2017   IR EXCHANGE BILIARY DRAIN  08/28/2017   IR EXCHANGE BILIARY DRAIN  10/11/2017   IR EXCHANGE BILIARY DRAIN  10/28/2017   IR EXCHANGE BILIARY DRAIN  12/23/2017   IR EXCHANGE BILIARY DRAIN  01/23/2018   IR EXCHANGE BILIARY DRAIN  02/17/2018   IR EXCHANGE BILIARY DRAIN  03/20/2018   IR EXCHANGE BILIARY DRAIN  04/17/2018   IR EXCHANGE BILIARY DRAIN  05/29/2018   IR EXCHANGE BILIARY DRAIN  07/24/2018   IR EXCHANGE BILIARY DRAIN  09/22/2018   IR EXCHANGE BILIARY DRAIN  11/20/2018   IR EXCHANGE BILIARY DRAIN  02/11/2019   IR EXCHANGE BILIARY DRAIN  04/14/2019   IR EXCHANGE BILIARY DRAIN  06/09/2019   IR EXCHANGE BILIARY DRAIN  11/26/2019   IR EXCHANGE BILIARY DRAIN  12/23/2019   IR EXCHANGE BILIARY DRAIN  02/22/2020   IR EXCHANGE BILIARY DRAIN  05/25/2020   IR EXCHANGE BILIARY DRAIN  07/29/2020   IR EXCHANGE BILIARY DRAIN  08/30/2020   IR EXCHANGE BILIARY DRAIN  10/06/2020   IR EXCHANGE BILIARY DRAIN  11/18/2020   IR EXCHANGE BILIARY DRAIN  12/21/2020   IR EXCHANGE BILIARY DRAIN  02/03/2021   IR EXCHANGE BILIARY DRAIN  03/07/2021   IR EXCHANGE BILIARY DRAIN  05/04/2021   IR EXCHANGE BILIARY  DRAIN  06/22/2021   IR EXCHANGE BILIARY DRAIN  07/04/2021   IR EXCHANGE BILIARY DRAIN  08/10/2021   IR PERC CHOLECYSTOSTOMY  01/27/2017   IR PERC CHOLECYSTOSTOMY  11/18/2019   IR RADIOLOGIST EVAL & MGMT  03/05/2017   IR SINUS/FIST TUBE CHK-NON GI  10/06/2020   MASTECTOMY, PARTIAL Right     Social History   Socioeconomic History   Marital status: Widowed    Spouse name: Not on file   Number of children: Not on file   Years of education: Not on file   Highest education level: Not on file  Occupational History   Occupation: retired  Tobacco Use   Smoking status: Never   Smokeless tobacco: Never  Vaping Use   Vaping Use: Never used  Substance and Sexual Activity   Alcohol use: No   Drug use: No   Sexual activity: Not Currently  Other Topics Concern   Not on file  Social History Narrative   Long term resident of [facility]    Social Determinants of Health   Financial Resource Strain: [place]  (07/21/2018)   Overall Financial Resource Strain (CARDIA)    Difficulty of Paying Living Expenses: Not hard  at all  Food Insecurity: No Food Insecurity (07/21/2018)   Hunger Vital Sign    Worried About Running Out of Food in the Last Year: Never true    Ran Out of Food in the Last Year: Never true  Transportation Needs: No Transportation Needs (07/21/2018)   PRAPARE - Hydrologist (Medical): No    Lack of Transportation (Non-Medical): No  Physical Activity: Inactive (07/21/2018)   Exercise Vital Sign    Days of Exercise per Week: 0 days    Minutes of Exercise per Session: 0 min  Stress: No Stress Concern Present (07/21/2018)   [place]    Feeling of Stress : Not at all  Social Connections: Socially Isolated (07/21/2018)   Social Connection and Isolation Panel [NHANES]    Frequency of Communication with Friends and Family: Never    Frequency of Social Gatherings with Friends and Family: Never     Attends Religious Services: Never    Marine scientist or Organizations: No    Attends Archivist Meetings: Never    Marital Status: Widowed  Intimate Partner Violence: Not At Risk (07/21/2018)   Humiliation, Afraid, Rape, and Kick questionnaire    Fear of Current or Ex-Partner: No    Emotionally Abused: No    Physically Abused: No    Sexually Abused: No   Family History  Problem Relation Age of Onset   Cancer Father        ? primary   Diabetes Neg Hx    Heart disease Neg Hx    Stroke Neg Hx       VITAL SIGNS BP 123/72   Pulse 78   Temp 98 F (36.7 C)   Resp 20   Ht '5\' 3"'$  (1.6 m)   Wt 136 lb 6.4 oz (61.9 kg)   SpO2 97%   BMI 24.16 kg/m   Outpatient Encounter Medications as of 08/10/2022  Medication Sig   acetaminophen (TYLENOL) 325 MG tablet Take 650 mg by mouth every 6 (six) hours as needed.   alendronate (FOSAMAX) 70 MG tablet Take 70 mg by mouth once a week. Take with a full glass of water on an empty stomach.   ascorbic acid (VITAMIN C) 500 MG tablet Take 500 mg by mouth 2 (two) times daily. For two weeks   Balsam [place]-Castor Oil (VENELEX) OINT Apply topically. Apply to bilateral buttocks qshift for erythema.   calcium carbonate (TUMS - DOSED IN MG ELEMENTAL CALCIUM) 500 MG chewable tablet Chew 1 tablet by mouth daily.   cholecalciferol (VITAMIN D) 25 MCG (1000 UNIT) tablet Take 1,000 Units by mouth daily.   Cyanocobalamin (VITAMIN B-12) 1000 MCG SUBL Place 1 tablet under the tongue at bedtime.   donepezil (ARICEPT) 10 MG tablet Take 1 tablet by mouth at bedtime.   LORazepam (ATIVAN) 0.5 MG tablet Take 0.5 mg by mouth daily as needed for anxiety.   NON FORMULARY Diet: Dysphagia 3 thin liquids   Nutritional Supplements (ENSURE ENLIVE PO) Take 1.5 kcal by mouth daily.   Throat Lozenges (ZINC W/A&C) LOZG Use as directed in the mouth or throat. 5 times per day   Vitamin D, Ergocalciferol, (DRISDOL) 1.25 MG (50000 UNIT) CAPS capsule Take 50,000 Units by  mouth every 7 (seven) days.   No facility-administered encounter medications on file as of 08/10/2022.     SIGNIFICANT DIAGNOSTIC EXAMS       ASSESSMENT/ PLAN:  Ok Edwards NP [facility]  Contact [phone] Monday through Friday 8am- 5pm  After hours call [phone]

## 2022-08-13 DIAGNOSIS — R768 Other specified abnormal immunological findings in serum: Secondary | ICD-10-CM | POA: Insufficient documentation

## 2022-08-15 ENCOUNTER — Encounter: Payer: Self-pay | Admitting: Adult Health

## 2022-08-15 ENCOUNTER — Non-Acute Institutional Stay (SKILLED_NURSING_FACILITY): Payer: Medicare Other | Admitting: Adult Health

## 2022-08-15 DIAGNOSIS — D649 Anemia, unspecified: Secondary | ICD-10-CM | POA: Diagnosis not present

## 2022-08-15 DIAGNOSIS — B009 Herpesviral infection, unspecified: Secondary | ICD-10-CM

## 2022-08-15 DIAGNOSIS — E441 Mild protein-calorie malnutrition: Secondary | ICD-10-CM | POA: Diagnosis not present

## 2022-08-15 LAB — HSV DNA BY PCR (REFERENCE LAB)
HSV 1 DNA: NEGATIVE
HSV 2 DNA: NEGATIVE

## 2022-08-15 NOTE — Progress Notes (Signed)
Location:  [address] Room Number: 140 Place of Service:  SNF (31)   CODE STATUS: dnr   No Known Allergies  Chief Complaint  Patient presents with   Medical Management of Chronic Issues                    Herpes:  Protein calorie malnutrition: Chronic anemia    HPI:  She is a 88 year old long term resident of this facility being seen for the management of her chronic illnesses: Herpes:  Protein calorie malnutrition: Chronic anemia. There are no reports of uncontrolled pain. Her weight is stable at 136 pounds. There are no reports of anxiety present.   Past Medical History:  Diagnosis Date   Aortic atherosclerosis ([place]) 11/15/2020   Breast cancer ([place])    Cognitive communication deficit    Dementia ([place])    Difficulty in walking(719.7)    Fall    HTN (hypertension) 08/26/2016   Muscle weakness (generalized)    Rash and nonspecific skin eruption    Thyroid disease    hypo   Urinary tract infection, site not specified    Vitamin B deficiency     Past Surgical History:  Procedure Laterality Date   COLON SURGERY     IR CATHETER TUBE CHANGE  05/07/2017   IR EXCHANGE BILIARY DRAIN  07/02/2017   IR EXCHANGE BILIARY DRAIN  07/10/2017   IR EXCHANGE BILIARY DRAIN  08/28/2017   IR EXCHANGE BILIARY DRAIN  10/11/2017   IR EXCHANGE BILIARY DRAIN  10/28/2017   IR EXCHANGE BILIARY DRAIN  12/23/2017   IR EXCHANGE BILIARY DRAIN  01/23/2018   IR EXCHANGE BILIARY DRAIN  02/17/2018   IR EXCHANGE BILIARY DRAIN  03/20/2018   IR EXCHANGE BILIARY DRAIN  04/17/2018   IR EXCHANGE BILIARY DRAIN  05/29/2018   IR EXCHANGE BILIARY DRAIN  07/24/2018   IR EXCHANGE BILIARY DRAIN  09/22/2018   IR EXCHANGE BILIARY DRAIN  11/20/2018   IR EXCHANGE BILIARY DRAIN  02/11/2019   IR EXCHANGE BILIARY DRAIN  04/14/2019   IR EXCHANGE BILIARY DRAIN  06/09/2019   IR EXCHANGE BILIARY DRAIN  11/26/2019   IR EXCHANGE BILIARY DRAIN  12/23/2019   IR EXCHANGE BILIARY DRAIN  02/22/2020   IR EXCHANGE BILIARY DRAIN   05/25/2020   IR EXCHANGE BILIARY DRAIN  07/29/2020   IR EXCHANGE BILIARY DRAIN  08/30/2020   IR EXCHANGE BILIARY DRAIN  10/06/2020   IR EXCHANGE BILIARY DRAIN  11/18/2020   IR EXCHANGE BILIARY DRAIN  12/21/2020   IR EXCHANGE BILIARY DRAIN  02/03/2021   IR EXCHANGE BILIARY DRAIN  03/07/2021   IR EXCHANGE BILIARY DRAIN  05/04/2021   IR EXCHANGE BILIARY DRAIN  06/22/2021   IR EXCHANGE BILIARY DRAIN  07/04/2021   IR EXCHANGE BILIARY DRAIN  08/10/2021   IR PERC CHOLECYSTOSTOMY  01/27/2017   IR PERC CHOLECYSTOSTOMY  11/18/2019   IR RADIOLOGIST EVAL & MGMT  03/05/2017   IR SINUS/FIST TUBE CHK-NON GI  10/06/2020   MASTECTOMY, PARTIAL Right     Social History   Socioeconomic History   Marital status: Widowed    Spouse name: Not on file   Number of children: Not on file   Years of education: Not on file   Highest education level: Not on file  Occupational History   Occupation: retired  Tobacco Use   Smoking status: Never   Smokeless tobacco: Never  Vaping Use   Vaping Use: Never used  Substance  and Sexual Activity   Alcohol use: No   Drug use: No   Sexual activity: Not Currently  Other Topics Concern   Not on file  Social History Narrative   Long [facility] resident of [facility]    Social Determinants of Health   Financial Resource Strain: Low Risk  (07/21/2018)   Overall Financial Resource Strain (CARDIA)    Difficulty of Paying Living Expenses: Not hard at all  Food Insecurity: No Food Insecurity (07/21/2018)   Hunger Vital Sign    Worried About Running Out of Food in the Last Year: Never true    Ran Out of Food in the Last Year: Never true  Transportation Needs: No Transportation Needs (07/21/2018)   PRAPARE - Hydrologist (Medical): No    Lack of Transportation (Non-Medical): No  Physical Activity: Inactive (07/21/2018)   Exercise Vital Sign    Days of Exercise per Week: 0 days    Minutes of Exercise per Session: 0 min  Stress: No Stress Concern Present (07/21/2018)    [place]    Feeling of Stress : Not at all  Social Connections: Socially Isolated (07/21/2018)   Social Connection and Isolation Panel [NHANES]    Frequency of Communication with Friends and Family: Never    Frequency of Social Gatherings with Friends and Family: Never    Attends Religious Services: Never    Marine scientist or Organizations: No    Attends Archivist Meetings: Never    Marital Status: Widowed  Intimate Partner Violence: Not At Risk (07/21/2018)   Humiliation, Afraid, Rape, and Kick questionnaire    Fear of Current or Ex-Partner: No    Emotionally Abused: No    Physically Abused: No    Sexually Abused: No   Family History  Problem Relation Age of Onset   Cancer Father        ? primary   Diabetes Neg Hx    Heart disease Neg Hx    Stroke Neg Hx       VITAL SIGNS BP 128/66   Pulse 66   Temp (!) 97.2 F (36.2 C)   Resp 20   Ht '5\' 3"'$  (1.6 m)   Wt 136 lb 6.4 oz (61.9 kg)   SpO2 96%   BMI 24.16 kg/m   Outpatient Encounter Medications as of 08/15/2022  Medication Sig   acetaminophen (TYLENOL) 325 MG tablet Take 650 mg by mouth every 6 (six) hours as needed.   alendronate (FOSAMAX) 70 MG tablet Take 70 mg by mouth once a week. Take with a full glass of water on an empty stomach.   ascorbic acid (VITAMIN C) 500 MG tablet Take 500 mg by mouth 2 (two) times daily. For two weeks   Balsam [place]-Castor Oil (VENELEX) OINT Apply topically. Apply to bilateral buttocks qshift for erythema.   calcium carbonate (TUMS - DOSED IN MG ELEMENTAL CALCIUM) 500 MG chewable tablet Chew 1 tablet by mouth daily.   cholecalciferol (VITAMIN D) 25 MCG (1000 UNIT) tablet Take 1,000 Units by mouth daily.   Cyanocobalamin (VITAMIN B-12) 1000 MCG SUBL Place 1 tablet under the tongue at bedtime.   donepezil (ARICEPT) 10 MG tablet Take 1 tablet by mouth at bedtime.   LORazepam (ATIVAN) 0.5 MG tablet Take 0.5 mg  by mouth daily as needed for anxiety.   NON FORMULARY Diet: Dysphagia 3 thin liquids   Nutritional Supplements (ENSURE ENLIVE PO) Take 1.5  kcal by mouth daily.   Throat Lozenges (ZINC W/A&C) LOZG Use as directed in the mouth or throat. 5 times per day   Vitamin D, Ergocalciferol, (DRISDOL) 1.25 MG (50000 UNIT) CAPS capsule Take 50,000 Units by mouth every 7 (seven) days.   No facility-administered encounter medications on file as of 08/15/2022.     SIGNIFICANT DIAGNOSTIC EXAMS   DIAGNOSTIC EXAMS  PREVIOUS   12-13-21: ct of abdomen and pelvis  Diverticulosis of colon. There is wall thickening along with pericolic stranding at the junction of descending and sigmoid colon in the left iliac fossa consistent with acute diverticulitis. There is no loculated pericolic abscess. There is gallbladder wall thickening along with gallbladder stones. Gallbladder is not distended. Findings may suggest chronic cholecystitis. If there is clinical suspicion for acute cholecystitis, gallbladder sonogram may be considered. There is no hydronephrosis. Right kidney is small in size with multiple foci of cortical thinning suggesting chronic pyelonephritis. Small hiatal hernia.  06-06-22: dexa scan: t score -2.464    LABS REVIEWED: PREVIOUS:   07-27-21: wbc 5.1; hgb 12.0; hct 36.7; mcv 101.1 plt 281; glucose 86; bun 16; creat 0.74; k+ 3.9; na++ 136; ca 8.4 GFR>60; liver normal albumin 3.0  12-13-21: wbc 17.4;hgb 11.8; hct 36.8; mcv98.4 plt 361; glucose 132; bun 17; creat 0.77; k+ 4.2; na++ 137; ca 8.7; GFR 53 protein 7.8; albumin 3.1 blood culture: no growth 12-16-21: wbc 7.4; hgb 11.0; hct 35.6;mcv 98.3 plt 358; glucose 93; bun 10; creat 0.94; k+ 3.5; na++ 139; ca 8.8; GFR 56  12-26-21: wbc 6.4; hgb 12.4; hct 39.1; mcv 99.5 plt 397; glucose 80; bun 15; creat 0.88; k+ 4.0; na++ 139; ca 9.2; GFR>60   TODAY  07-30-22: wbc 4.9; hgb 11.9; hct 37.4; mcv 97.9 plt 262; glucose 77; bun 16; creat 0.81; k+ 4.1; na++ 142; ca  8.5 gfr >60; protein 6.5 albumin 3.0; vitamin B 12: 636; vitamin D 51.61   Review of Systems  Constitutional:  Negative for malaise/fatigue.  Respiratory:  Negative for cough and shortness of breath.   Cardiovascular:  Negative for chest pain, palpitations and leg swelling.  Gastrointestinal:  Negative for abdominal pain, constipation and heartburn.  Musculoskeletal:  Negative for back pain, joint pain and myalgias.  Skin: Negative.   Neurological:  Negative for dizziness.  Psychiatric/Behavioral:  The patient is not nervous/anxious.     Physical Exam Constitutional:      General: She is not in acute distress.    Appearance: She is well-developed. She is not diaphoretic.  Neck:     Thyroid: No thyromegaly.  Cardiovascular:     Rate and Rhythm: Normal rate and regular rhythm.     Heart sounds: Normal heart sounds.  Pulmonary:     Effort: Pulmonary effort is normal. No respiratory distress.     Breath sounds: Normal breath sounds.  Chest:     Comments: Right partial mastectomy Abdominal:     General: Bowel sounds are normal. There is no distension.     Palpations: Abdomen is soft.     Tenderness: There is no abdominal tenderness.     Comments: Chole drain out   Musculoskeletal:        General: Normal range of motion.     Cervical back: Neck supple.     Right lower leg: No edema.     Left lower leg: No edema.  Lymphadenopathy:     Cervical: No cervical adenopathy.  Skin:    General: Skin is warm and dry.  Neurological:  Mental Status: She is alert. Mental status is at baseline.  Psychiatric:        Mood and Affect: Mood normal.         ASSESSMENT/ PLAN:  TODAY  Herpes: without outbreak is off valtrex  2. Protein calorie malnutrition: albumin 3.0 will continue supplements as directed  3. Chronic anemia hgb 11.9   PREVIOUS   4. Aortic atherosclerosis (ct 08-26-19) due to advanced age is not on statin.   5. Major neurocognitive disorder/vascular dementia  without behavioral disturbance; weight is 136 pounds; weight is stable will continue aricept 10 mg daily   6. Primary hypertension: b/p 128/66  7. Calculus of gall bladder without acute cholecystitis and obstruction: chole drain is out at this time; will monitor   8. Chronic constipation: will continue miralax daily as needed   9. Post menopausal osteoporosis: t score -2.464 will continue supplements and fosamax 70 mg weekly     Ok Edwards NP [facility]   call [phone]

## 2022-09-18 ENCOUNTER — Non-Acute Institutional Stay (SKILLED_NURSING_FACILITY): Payer: Medicare Other | Admitting: Internal Medicine

## 2022-09-18 ENCOUNTER — Encounter: Payer: Self-pay | Admitting: Internal Medicine

## 2022-09-18 DIAGNOSIS — D649 Anemia, unspecified: Secondary | ICD-10-CM | POA: Diagnosis not present

## 2022-09-18 DIAGNOSIS — M810 Age-related osteoporosis without current pathological fracture: Secondary | ICD-10-CM | POA: Diagnosis not present

## 2022-09-18 DIAGNOSIS — E441 Mild protein-calorie malnutrition: Secondary | ICD-10-CM

## 2022-09-18 DIAGNOSIS — I700 Atherosclerosis of aorta: Secondary | ICD-10-CM | POA: Diagnosis not present

## 2022-09-18 DIAGNOSIS — F0150 Vascular dementia without behavioral disturbance: Secondary | ICD-10-CM

## 2022-09-18 NOTE — Assessment & Plan Note (Signed)
She denies any anginal equivalent at this time.  She is not on any suppressive anginal therapy.

## 2022-09-18 NOTE — Assessment & Plan Note (Signed)
Serially her anemia has been fairly stable with a range of 10.2/31.7 up to high of 12.6/39.1.  Current values are 11.9/34 with normochromic, normocytic indices.  Staff does not report any bleeding dyscrasias here at the SNF.

## 2022-09-18 NOTE — Progress Notes (Signed)
NURSING HOME LOCATION:  [facility] ROOM NUMBER:  [id number]  CODE STATUS:  DNR  PCP:  Ok Edwards NP  This is a nursing facility follow up visit of chronic medical diagnoses & to document compliance with Regulation 483.30 (c) in The [place] Manual Phase 2 which mandates caregiver visit ( visits can alternate among physician, PA or NP as per statutes) within 10 days of 30 days / 60 days/ 90 days post admission to SNF date    Interim medical record and care since last SNF visit was updated with review of diagnostic studies and change in clinical status since last visit were documented.  HPI: She is a permanent resident of this facility with medical diagnoses of aortic atherosclerosis, history of breast cancer, essential hypertension, & dementia. She has had multiple biliary drain exchanges by IR.  Additionally she has had a partial mastectomy.  Labs are current and reveal progression of hypocalcemia with a current value of 8.5.  Prior values were 9.2.  Albumin was 3 but total protein was low normal at 6.5.  Serially her H/H varied from a low of 10.2/31.7 up to high of 12.6/39.1.  The most recent values were 11.9/34 with normochromic, normocytic indices.  Review of systems: Dementia invalidated responses.  She confabulated about the picture of Malvin Johns who played Theodis Shove on her wall.  She states that [place] was not [place]  She also confabulated about going to school in [place] at [employer].  She indicated she may have taken some music courses.  She denies any active symptoms in reference to review of systems, especially any relating to the GI tract or biliary system.Marland Kitchen  She went on to say "I am afraid I am really healthy."  Constitutional: No fever, significant weight change, fatigue  Eyes: No redness, discharge, pain, vision change ENT/mouth: No nasal congestion,  purulent discharge, earache, change in  hearing, sore throat  Cardiovascular: No chest pain, palpitations, paroxysmal nocturnal dyspnea, claudication, edema  Respiratory: No cough, sputum production, hemoptysis, DOE, significant snoring, apnea   Gastrointestinal: No heartburn, dysphagia, abdominal pain, nausea /vomiting, rectal bleeding, melena, change in bowels Genitourinary: No dysuria, hematuria, pyuria, incontinence, nocturia Musculoskeletal: No joint stiffness, joint swelling, weakness, pain Dermatologic: No rash, pruritus, change in appearance of skin Neurologic: No dizziness, headache, syncope, seizures, numbness, tingling Psychiatric: No significant anxiety, depression, insomnia, anorexia Endocrine: No change in hair/skin/nails, excessive thirst, excessive hunger, excessive urination  Hematologic/lymphatic: No significant bruising, lymphadenopathy, abnormal bleeding Allergy/immunology: No itchy/watery eyes, significant sneezing, urticaria, angioedema  Physical exam:  Pertinent or positive findings: She appears her age.  Teeth are coated.  Breath sounds are decreased and heart sounds are distant.  Pedal pulses are decreased.  She has interosseous wasting of the hands.  General appearance: Adequately nourished; no acute distress, increased work of breathing is present.   Lymphatic: No lymphadenopathy about the head, neck, axilla. Eyes: No conjunctival inflammation or lid edema is present. There is no scleral icterus. Ears:  External ear exam shows no significant lesions or deformities.   Nose:  External nasal examination shows no deformity or inflammation. Nasal mucosa are pink and moist without lesions, exudates Oral exam:  Lips and gums are healthy appearing. There is no oropharyngeal erythema or exudate. Neck:  No thyromegaly, masses, tenderness noted.    Heart:  Normal rate and regular rhythm. S1 and S2 normal without gallop, murmur, click, rub .  Lungs: Chest clear to auscultation without wheezes, rhonchi, rales,  rubs. Abdomen: Bowel sounds are normal. Abdomen is soft and nontender with no organomegaly, hernias, masses. GU: Deferred  Extremities:  No cyanosis, clubbing, edema  Neurologic exam :Balance, Rhomberg, finger to nose testing could not be completed due to clinical state Skin: Warm & dry w/o tenting. No significant lesions or rash.  See summary under each active problem in the Problem List with associated updated therapeutic plan

## 2022-09-18 NOTE — Patient Instructions (Signed)
See assessment and plan under each diagnosis in the problem list and acutely for this visit 

## 2022-09-18 NOTE — Assessment & Plan Note (Addendum)
Albumin is decreased to 3.1 with a low normal total protein of 6.5. Interosseous wasting present. Nutritionist continues to follow at the SNF.

## 2022-09-18 NOTE — Assessment & Plan Note (Addendum)
She denies any symptoms on complete review . She confabulated about her educational experiences and the poster on the wall.  There are no significant behavioral issues reported by staff.

## 2022-09-18 NOTE — Assessment & Plan Note (Signed)
Bone Turnover Markers (BTM) which assess bone formation include: bone specific alkaline phosphatase (BSAP); total procollagen type 1N-terminal propeptide (P1NP); & osteocalcin.These may help assess response to osteoporosis therapies.

## 2022-10-11 ENCOUNTER — Non-Acute Institutional Stay (SKILLED_NURSING_FACILITY): Payer: Medicare Other | Admitting: Adult Health

## 2022-10-11 ENCOUNTER — Encounter: Payer: Self-pay | Admitting: Adult Health

## 2022-10-11 DIAGNOSIS — I10 Essential (primary) hypertension: Secondary | ICD-10-CM

## 2022-10-11 DIAGNOSIS — F0390 Unspecified dementia without behavioral disturbance: Secondary | ICD-10-CM | POA: Diagnosis not present

## 2022-10-11 DIAGNOSIS — F0150 Vascular dementia without behavioral disturbance: Secondary | ICD-10-CM

## 2022-10-11 DIAGNOSIS — I700 Atherosclerosis of aorta: Secondary | ICD-10-CM

## 2022-10-11 NOTE — Progress Notes (Signed)
Location:  [facility] Room Number: 141-D Place of Service:  SNF (31)   CODE STATUS: DNR  No Known Allergies  Chief Complaint  Patient presents with   Medical Management of Chronic Issues                     Aortic atherosclerosis Major neurocognitive disorder/vascular dementia without behavioral disturbance:  Primary hypertension    HPI:  She is a 88 year old long term resident of this facility being seen for the management of her chronic illnesses: Aortic atherosclerosis Major neurocognitive disorder/vascular dementia without behavioral disturbance:  Primary hypertension. There are no reports of uncontrolled pain. There are no reports of anxiety or depressive thoughts. Her weight remains stable.   Past Medical History:  Diagnosis Date   Aortic atherosclerosis ([place]) 11/15/2020   Breast cancer ([place])    Cognitive communication deficit    Dementia ([place])    Difficulty in walking(719.7)    Fall    HTN (hypertension) 08/26/2016   Muscle weakness (generalized)    Rash and nonspecific skin eruption    Thyroid disease    hypo   Urinary tract infection, site not specified    Vitamin B deficiency     Past Surgical History:  Procedure Laterality Date   COLON SURGERY     IR CATHETER TUBE CHANGE  05/07/2017   IR EXCHANGE BILIARY DRAIN  07/02/2017   IR EXCHANGE BILIARY DRAIN  07/10/2017   IR EXCHANGE BILIARY DRAIN  08/28/2017   IR EXCHANGE BILIARY DRAIN  10/11/2017   IR EXCHANGE BILIARY DRAIN  10/28/2017   IR EXCHANGE BILIARY DRAIN  12/23/2017   IR EXCHANGE BILIARY DRAIN  01/23/2018   IR EXCHANGE BILIARY DRAIN  02/17/2018   IR EXCHANGE BILIARY DRAIN  03/20/2018   IR EXCHANGE BILIARY DRAIN  04/17/2018   IR EXCHANGE BILIARY DRAIN  05/29/2018   IR EXCHANGE BILIARY DRAIN  07/24/2018   IR EXCHANGE BILIARY DRAIN  09/22/2018   IR EXCHANGE BILIARY DRAIN  11/20/2018   IR EXCHANGE BILIARY DRAIN  02/11/2019   IR EXCHANGE BILIARY DRAIN  04/14/2019   IR EXCHANGE BILIARY DRAIN   06/09/2019   IR EXCHANGE BILIARY DRAIN  11/26/2019   IR EXCHANGE BILIARY DRAIN  12/23/2019   IR EXCHANGE BILIARY DRAIN  02/22/2020   IR EXCHANGE BILIARY DRAIN  05/25/2020   IR EXCHANGE BILIARY DRAIN  07/29/2020   IR EXCHANGE BILIARY DRAIN  08/30/2020   IR EXCHANGE BILIARY DRAIN  10/06/2020   IR EXCHANGE BILIARY DRAIN  11/18/2020   IR EXCHANGE BILIARY DRAIN  12/21/2020   IR EXCHANGE BILIARY DRAIN  02/03/2021   IR EXCHANGE BILIARY DRAIN  03/07/2021   IR EXCHANGE BILIARY DRAIN  05/04/2021   IR EXCHANGE BILIARY DRAIN  06/22/2021   IR EXCHANGE BILIARY DRAIN  07/04/2021   IR EXCHANGE BILIARY DRAIN  08/10/2021   IR PERC CHOLECYSTOSTOMY  01/27/2017   IR PERC CHOLECYSTOSTOMY  11/18/2019   IR RADIOLOGIST EVAL & MGMT  03/05/2017   IR SINUS/FIST TUBE CHK-NON GI  10/06/2020   MASTECTOMY, PARTIAL Right     Social History   Socioeconomic History   Marital status: Widowed    Spouse name: Not on file   Number of children: Not on file   Years of education: Not on file   Highest education level: Not on file  Occupational History   Occupation: retired  Tobacco Use   Smoking status: Never   Smokeless tobacco: Never  Vaping Use  Vaping Use: Never used  Substance and Sexual Activity   Alcohol use: No   Drug use: No   Sexual activity: Not Currently  Other Topics Concern   Not on file  Social History Narrative   Long term resident of [facility]    Social Determinants of Health   Financial Resource Strain: Low Risk  (07/21/2018)   Overall Financial Resource Strain (CARDIA)    Difficulty of Paying Living Expenses: Not hard at all  Food Insecurity: No Food Insecurity (07/21/2018)   Hunger Vital Sign    Worried About Running Out of Food in the Last Year: Never true    Ran Out of Food in the Last Year: Never true  Transportation Needs: No Transportation Needs (07/21/2018)   PRAPARE - Hydrologist (Medical): No    Lack of Transportation (Non-Medical): No  Physical Activity: Inactive  (07/21/2018)   Exercise Vital Sign    Days of Exercise per Week: 0 days    Minutes of Exercise per Session: 0 min  Stress: No Stress Concern Present (07/21/2018)   [place]    Feeling of Stress : Not at all  Social Connections: Socially Isolated (07/21/2018)   Social Connection and Isolation Panel [NHANES]    Frequency of Communication with Friends and Family: Never    Frequency of Social Gatherings with Friends and Family: Never    Attends Religious Services: Never    Marine scientist or Organizations: No    Attends Archivist Meetings: Never    Marital Status: Widowed  Intimate Partner Violence: Not At Risk (07/21/2018)   Humiliation, Afraid, Rape, and Kick questionnaire    Fear of Current or Ex-Partner: No    Emotionally Abused: No    Physically Abused: No    Sexually Abused: No   Family History  Problem Relation Age of Onset   Cancer Father        ? primary   Diabetes Neg Hx    Heart disease Neg Hx    Stroke Neg Hx       VITAL SIGNS BP 120/76   Pulse 75   Temp (!) 97.4 F (36.3 C)   Resp 20   Ht '5\' 3"'$  (1.6 m)   Wt 134 lb 3.2 oz (60.9 kg)   SpO2 99%   BMI 23.77 kg/m   Outpatient Encounter Medications as of 10/11/2022  Medication Sig   acetaminophen (TYLENOL) 325 MG tablet Take 650 mg by mouth every 6 (six) hours as needed.   alendronate (FOSAMAX) 70 MG tablet Take 70 mg by mouth once a week. Take with a full glass of water on an empty stomach.   Balsam [place]-Castor Oil (VENELEX) OINT Apply topically. Apply to bilateral buttocks qshift for erythema.   calcium carbonate (TUMS - DOSED IN MG ELEMENTAL CALCIUM) 500 MG chewable tablet Chew 1 tablet by mouth daily.   cholecalciferol (VITAMIN D) 25 MCG (1000 UNIT) tablet Take 1,000 Units by mouth daily.   Cyanocobalamin (VITAMIN B-12) 1000 MCG SUBL Place 1 tablet under the tongue at bedtime.   donepezil (ARICEPT) 10 MG tablet Take 1 tablet  by mouth at bedtime.   LORazepam (ATIVAN) 0.5 MG tablet Take 0.5 mg by mouth daily as needed for anxiety.   NON FORMULARY Diet: Dysphagia 3 thin liquids   Nutritional Supplements (ENSURE ENLIVE PO) Take 1.5 kcal by mouth daily.   [DISCONTINUED] ascorbic acid (VITAMIN C) 500 MG tablet Take  500 mg by mouth 2 (two) times daily. For two weeks   [DISCONTINUED] Throat Lozenges (ZINC W/A&C) LOZG Use as directed in the mouth or throat. 5 times per day   [DISCONTINUED] Vitamin D, Ergocalciferol, (DRISDOL) 1.25 MG (50000 UNIT) CAPS capsule Take 50,000 Units by mouth every 7 (seven) days.   No facility-administered encounter medications on file as of 10/11/2022.     SIGNIFICANT DIAGNOSTIC EXAMS  DIAGNOSTIC EXAMS  PREVIOUS   12-13-21: ct of abdomen and pelvis  Diverticulosis of colon. There is wall thickening along with pericolic stranding at the junction of descending and sigmoid colon in the left iliac fossa consistent with acute diverticulitis. There is no loculated pericolic abscess. There is gallbladder wall thickening along with gallbladder stones. Gallbladder is not distended. Findings may suggest chronic cholecystitis. If there is clinical suspicion for acute cholecystitis, gallbladder sonogram may be considered. There is no hydronephrosis. Right kidney is small in size with multiple foci of cortical thinning suggesting chronic pyelonephritis. Small hiatal hernia.  06-06-22: dexa scan: t score -2.464    LABS REVIEWED: PREVIOUS:   12-13-21: wbc 17.4;hgb 11.8; hct 36.8; mcv98.4 plt 361; glucose 132; bun 17; creat 0.77; k+ 4.2; na++ 137; ca 8.7; GFR 53 protein 7.8; albumin 3.1 blood culture: no growth 12-16-21: wbc 7.4; hgb 11.0; hct 35.6;mcv 98.3 plt 358; glucose 93; bun 10; creat 0.94; k+ 3.5; na++ 139; ca 8.8; GFR 56  12-26-21: wbc 6.4; hgb 12.4; hct 39.1; mcv 99.5 plt 397; glucose 80; bun 15; creat 0.88; k+ 4.0; na++ 139; ca 9.2; GFR>60  07-30-22: wbc 4.9; hgb 11.9; hct 37.4; mcv 97.9 plt 262;  glucose 77; bun 16; creat 0.81; k+ 4.1; na++ 142; ca 8.5 gfr >60; protein 6.5 albumin 3.0; vitamin B 12: 636; vitamin D 51.61  NO NEW LABS.    Review of Systems  Constitutional:  Negative for malaise/fatigue.  Respiratory:  Negative for cough and shortness of breath.   Cardiovascular:  Negative for chest pain, palpitations and leg swelling.  Gastrointestinal:  Negative for abdominal pain, constipation and heartburn.  Musculoskeletal:  Negative for back pain, joint pain and myalgias.  Skin: Negative.   Neurological:  Negative for dizziness.  Psychiatric/Behavioral:  The patient is not nervous/anxious.    Physical Exam Constitutional:      General: She is not in acute distress.    Appearance: She is well-developed. She is not diaphoretic.  Neck:     Thyroid: No thyromegaly.  Cardiovascular:     Rate and Rhythm: Normal rate and regular rhythm.     Pulses: Normal pulses.     Heart sounds: Normal heart sounds.  Pulmonary:     Effort: Pulmonary effort is normal. No respiratory distress.     Breath sounds: Normal breath sounds.  Chest:     Comments: Right partial mastectomy Abdominal:     General: Bowel sounds are normal. There is no distension.     Palpations: Abdomen is soft.     Tenderness: There is no abdominal tenderness.     Comments: Chole drain out    Musculoskeletal:        General: Normal range of motion.     Cervical back: Neck supple.     Right lower leg: No edema.     Left lower leg: No edema.  Lymphadenopathy:     Cervical: No cervical adenopathy.  Skin:    General: Skin is warm and dry.  Neurological:     Mental Status: She is alert. Mental status is  at baseline.  Psychiatric:        Mood and Affect: Mood normal.         ASSESSMENT/ PLAN:  TODAY  Aortic atherosclerosis (ct 08-26-19); is not on statin due to advanced age  19. Major neurocognitive disorder/vascular dementia without behavioral disturbance: weight is 134 pounds; weight remains stable; will  continue aricept 10 mg daily   3. Primary hypertension: b/p 120/76    PREVIOUS   4. Calculus of gall bladder without acute cholecystitis and obstruction: chole drain is out at this time; will monitor   5. Chronic constipation: will continue miralax daily as needed   6. Post menopausal osteoporosis: t score -2.464 will continue supplements and fosamax 70 mg weekly   7. Herpes: without outbreak is off valtrex  8. Protein calorie malnutrition: albumin 3.0 will continue supplements as directed  9. Chronic anemia hgb 11.9    Ok Edwards NP [facility]   call [phone]

## 2022-11-09 ENCOUNTER — Encounter: Payer: Self-pay | Admitting: Adult Health

## 2022-11-09 ENCOUNTER — Non-Acute Institutional Stay (SKILLED_NURSING_FACILITY): Payer: Medicare Other | Admitting: Adult Health

## 2022-11-09 DIAGNOSIS — F0150 Vascular dementia without behavioral disturbance: Secondary | ICD-10-CM

## 2022-11-09 DIAGNOSIS — I700 Atherosclerosis of aorta: Secondary | ICD-10-CM

## 2022-11-09 DIAGNOSIS — F0390 Unspecified dementia without behavioral disturbance: Secondary | ICD-10-CM

## 2022-11-09 NOTE — Progress Notes (Unsigned)
Location:  [facility] Room Number: NO/141/D Place of Service:  SNF (31) Ok Edwards S.,NP  CODE STATUS: DNR  No Known Allergies  Chief Complaint  Patient presents with   Acute Visit    Patient is being seem for care plan meeting    HPI:  We have come together for her care plan meeting. BIMS none; mood 0/30. She does ambulate with a walker with no falls. She requires supervision to independent with her adls. She is frequently incontinent of bladder and bowel. Dietary: weight is 133.4 pounds; D3 on thin liquids appetite 1-50%; feeds self. Therapy: none at this time. Activities: attends at times. She continues to be followed for her chronic illnesses including:  Aortic  atherosclerosis   Major neurocognitive disorder  Vascular dementia without behavioral disturbance  Past Medical History:  Diagnosis Date   Aortic atherosclerosis ([place]) 11/15/2020   Breast cancer ([place])    Cognitive communication deficit    Dementia ([place])    Difficulty in walking(719.7)    Fall    HTN (hypertension) 08/26/2016   Muscle weakness (generalized)    Rash and nonspecific skin eruption    Thyroid disease    hypo   Urinary tract infection, site not specified    Vitamin B deficiency     Past Surgical History:  Procedure Laterality Date   COLON SURGERY     IR CATHETER TUBE CHANGE  05/07/2017   IR EXCHANGE BILIARY DRAIN  07/02/2017   IR EXCHANGE BILIARY DRAIN  07/10/2017   IR EXCHANGE BILIARY DRAIN  08/28/2017   IR EXCHANGE BILIARY DRAIN  10/11/2017   IR EXCHANGE BILIARY DRAIN  10/28/2017   IR EXCHANGE BILIARY DRAIN  12/23/2017   IR EXCHANGE BILIARY DRAIN  01/23/2018   IR EXCHANGE BILIARY DRAIN  02/17/2018   IR EXCHANGE BILIARY DRAIN  03/20/2018   IR EXCHANGE BILIARY DRAIN  04/17/2018   IR EXCHANGE BILIARY DRAIN  05/29/2018   IR EXCHANGE BILIARY DRAIN  07/24/2018   IR EXCHANGE BILIARY DRAIN  09/22/2018   IR EXCHANGE BILIARY DRAIN  11/20/2018   IR EXCHANGE BILIARY DRAIN  02/11/2019   IR  EXCHANGE BILIARY DRAIN  04/14/2019   IR EXCHANGE BILIARY DRAIN  06/09/2019   IR EXCHANGE BILIARY DRAIN  11/26/2019   IR EXCHANGE BILIARY DRAIN  12/23/2019   IR EXCHANGE BILIARY DRAIN  02/22/2020   IR EXCHANGE BILIARY DRAIN  05/25/2020   IR EXCHANGE BILIARY DRAIN  07/29/2020   IR EXCHANGE BILIARY DRAIN  08/30/2020   IR EXCHANGE BILIARY DRAIN  10/06/2020   IR EXCHANGE BILIARY DRAIN  11/18/2020   IR EXCHANGE BILIARY DRAIN  12/21/2020   IR EXCHANGE BILIARY DRAIN  02/03/2021   IR EXCHANGE BILIARY DRAIN  03/07/2021   IR EXCHANGE BILIARY DRAIN  05/04/2021   IR EXCHANGE BILIARY DRAIN  06/22/2021   IR EXCHANGE BILIARY DRAIN  07/04/2021   IR EXCHANGE BILIARY DRAIN  08/10/2021   IR PERC CHOLECYSTOSTOMY  01/27/2017   IR PERC CHOLECYSTOSTOMY  11/18/2019   IR RADIOLOGIST EVAL & MGMT  03/05/2017   IR SINUS/FIST TUBE CHK-NON GI  10/06/2020   MASTECTOMY, PARTIAL Right     Social History   Socioeconomic History   Marital status: Widowed    Spouse name: Not on file   Number of children: Not on file   Years of education: Not on file   Highest education level: Not on file  Occupational History   Occupation: retired  Tobacco Use   Smoking status: Never  Smokeless tobacco: Never  Vaping Use   Vaping Use: Never used  Substance and Sexual Activity   Alcohol use: No   Drug use: No   Sexual activity: Not Currently  Other Topics Concern   Not on file  Social History Narrative   Long term resident of [facility]    Social Determinants of Health   Financial Resource Strain: Low Risk  (07/21/2018)   Overall Financial Resource Strain (CARDIA)    Difficulty of Paying Living Expenses: Not hard at all  Food Insecurity: No Food Insecurity (07/21/2018)   Hunger Vital Sign    Worried About Running Out of Food in the Last Year: Never true    Ran Out of Food in the Last Year: Never true  Transportation Needs: No Transportation Needs (07/21/2018)   PRAPARE - Hydrologist (Medical): No    Lack of  Transportation (Non-Medical): No  Physical Activity: Inactive (07/21/2018)   Exercise Vital Sign    Days of Exercise per Week: 0 days    Minutes of Exercise per Session: 0 min  Stress: No Stress Concern Present (07/21/2018)   [place]    Feeling of Stress : Not at all  Social Connections: Socially Isolated (07/21/2018)   Social Connection and Isolation Panel [NHANES]    Frequency of Communication with Friends and Family: Never    Frequency of Social Gatherings with Friends and Family: Never    Attends Religious Services: Never    Marine scientist or Organizations: No    Attends Archivist Meetings: Never    Marital Status: Widowed  Intimate Partner Violence: Not At Risk (07/21/2018)   Humiliation, Afraid, Rape, and Kick questionnaire    Fear of Current or Ex-Partner: No    Emotionally Abused: No    Physically Abused: No    Sexually Abused: No   Family History  Problem Relation Age of Onset   Cancer Father        ? primary   Diabetes Neg Hx    Heart disease Neg Hx    Stroke Neg Hx       VITAL SIGNS BP 134/76   Pulse 73   Temp (!) 97.4 F (36.3 C)   Resp 20   Ht '5\' 3"'$  (1.6 m)   Wt 133 lb 6.4 oz (60.5 kg)   SpO2 99%   BMI 23.63 kg/m   Outpatient Encounter Medications as of 11/09/2022  Medication Sig   acetaminophen (TYLENOL) 325 MG tablet Take 650 mg by mouth every 6 (six) hours as needed.   alendronate (FOSAMAX) 70 MG tablet Take 70 mg by mouth once a week. Take with a full glass of water on an empty stomach.   Balsam [place]-Castor Oil (VENELEX) OINT Apply topically. Apply to bilateral buttocks qshift for erythema.   calcium carbonate (TUMS - DOSED IN MG ELEMENTAL CALCIUM) 500 MG chewable tablet Chew 1 tablet by mouth daily.   cholecalciferol (VITAMIN D) 25 MCG (1000 UNIT) tablet Take 1,000 Units by mouth daily.   Cyanocobalamin (VITAMIN B-12) 1000 MCG SUBL Place 1 tablet under the tongue  at bedtime.   donepezil (ARICEPT) 10 MG tablet Take 1 tablet by mouth at bedtime.   LORazepam (ATIVAN) 0.5 MG tablet Take 0.5 mg by mouth daily as needed for anxiety.   NON FORMULARY Diet: Dysphagia 3 thin liquids   Nutritional Supplements (ENSURE ENLIVE PO) Take 1.5 kcal by mouth daily.   No  facility-administered encounter medications on file as of 11/09/2022.     SIGNIFICANT DIAGNOSTIC EXAMS   DIAGNOSTIC EXAMS  PREVIOUS   12-13-21: ct of abdomen and pelvis  Diverticulosis of colon. There is wall thickening along with pericolic stranding at the junction of descending and sigmoid colon in the left iliac fossa consistent with acute diverticulitis. There is no loculated pericolic abscess. There is gallbladder wall thickening along with gallbladder stones. Gallbladder is not distended. Findings may suggest chronic cholecystitis. If there is clinical suspicion for acute cholecystitis, gallbladder sonogram may be considered. There is no hydronephrosis. Right kidney is small in size with multiple foci of cortical thinning suggesting chronic pyelonephritis. Small hiatal hernia.  06-06-22: dexa scan: t score -2.464    LABS REVIEWED: PREVIOUS:   12-13-21: wbc 17.4;hgb 11.8; hct 36.8; mcv98.4 plt 361; glucose 132; bun 17; creat 0.77; k+ 4.2; na++ 137; ca 8.7; GFR 53 protein 7.8; albumin 3.1 blood culture: no growth 12-16-21: wbc 7.4; hgb 11.0; hct 35.6;mcv 98.3 plt 358; glucose 93; bun 10; creat 0.94; k+ 3.5; na++ 139; ca 8.8; GFR 56  12-26-21: wbc 6.4; hgb 12.4; hct 39.1; mcv 99.5 plt 397; glucose 80; bun 15; creat 0.88; k+ 4.0; na++ 139; ca 9.2; GFR>60  07-30-22: wbc 4.9; hgb 11.9; hct 37.4; mcv 97.9 plt 262; glucose 77; bun 16; creat 0.81; k+ 4.1; na++ 142; ca 8.5 gfr >60; protein 6.5 albumin 3.0; vitamin B 12: 636; vitamin D 51.61  NO NEW LABS.    Review of Systems  Constitutional:  Negative for malaise/fatigue.  Respiratory:  Negative for cough and shortness of breath.   Cardiovascular:   Negative for chest pain, palpitations and leg swelling.  Gastrointestinal:  Negative for abdominal pain, constipation and heartburn.  Musculoskeletal:  Negative for back pain, joint pain and myalgias.  Skin: Negative.   Neurological:  Negative for dizziness.  Psychiatric/Behavioral:  The patient is not nervous/anxious.    Physical Exam Constitutional:      General: She is not in acute distress.    Appearance: She is well-developed. She is not diaphoretic.  Neck:     Thyroid: No thyromegaly.  Cardiovascular:     Rate and Rhythm: Normal rate and regular rhythm.     Heart sounds: Normal heart sounds.  Pulmonary:     Effort: Pulmonary effort is normal. No respiratory distress.     Breath sounds: Normal breath sounds.  Chest:     Comments: Right partial mastectomy Abdominal:     General: Bowel sounds are normal. There is no distension.     Palpations: Abdomen is soft.     Tenderness: There is no abdominal tenderness.  Musculoskeletal:        General: Normal range of motion.     Cervical back: Neck supple.     Right lower leg: No edema.     Left lower leg: No edema.  Lymphadenopathy:     Cervical: No cervical adenopathy.  Skin:    General: Skin is warm and dry.  Neurological:     Mental Status: She is alert. Mental status is at baseline.  Psychiatric:        Mood and Affect: Mood normal.       ASSESSMENT/ PLAN:  TODAY  Aortic  atherosclerosis  Major neurocognitive disorder Vascular dementia without behavioral disturbance  Will continue current medications Will continue current plan of care Will continue to monitor her status.   Time spent with patient: 40 minutes: plan of care; medications; dietary.  Ok Edwards NP [facility]  call [phone]

## 2022-11-19 ENCOUNTER — Encounter: Payer: Self-pay | Admitting: Adult Health

## 2022-11-19 ENCOUNTER — Non-Acute Institutional Stay (SKILLED_NURSING_FACILITY): Payer: Medicare Other | Admitting: Adult Health

## 2022-11-19 DIAGNOSIS — K5909 Other constipation: Secondary | ICD-10-CM | POA: Diagnosis not present

## 2022-11-19 DIAGNOSIS — K8061 Calculus of gallbladder and bile duct with cholecystitis, unspecified, with obstruction: Secondary | ICD-10-CM

## 2022-11-19 DIAGNOSIS — M810 Age-related osteoporosis without current pathological fracture: Secondary | ICD-10-CM

## 2022-11-19 NOTE — Progress Notes (Unsigned)
Location:  [place] Room Number: 141D Place of Service:  SNF (31)   CODE STATUS: DNR  No Known Allergies  Chief Complaint  Patient presents with   Medical Management of Chronic Issues    Routine follow up   Immunizations    COVID booster due    HPI:    Past Medical History:  Diagnosis Date   Aortic atherosclerosis ([place]) 11/15/2020   Breast cancer ([place])    Cognitive communication deficit    Dementia ([place])    Difficulty in walking(719.7)    Fall    HTN (hypertension) 08/26/2016   Muscle weakness (generalized)    Rash and nonspecific skin eruption    Thyroid disease    hypo   Urinary tract infection, site not specified    Vitamin B deficiency     Past Surgical History:  Procedure Laterality Date   COLON SURGERY     IR CATHETER TUBE CHANGE  05/07/2017   IR EXCHANGE BILIARY DRAIN  07/02/2017   IR EXCHANGE BILIARY DRAIN  07/10/2017   IR EXCHANGE BILIARY DRAIN  08/28/2017   IR EXCHANGE BILIARY DRAIN  10/11/2017   IR EXCHANGE BILIARY DRAIN  10/28/2017   IR EXCHANGE BILIARY DRAIN  12/23/2017   IR EXCHANGE BILIARY DRAIN  01/23/2018   IR EXCHANGE BILIARY DRAIN  02/17/2018   IR EXCHANGE BILIARY DRAIN  03/20/2018   IR EXCHANGE BILIARY DRAIN  04/17/2018   IR EXCHANGE BILIARY DRAIN  05/29/2018   IR EXCHANGE BILIARY DRAIN  07/24/2018   IR EXCHANGE BILIARY DRAIN  09/22/2018   IR EXCHANGE BILIARY DRAIN  11/20/2018   IR EXCHANGE BILIARY DRAIN  02/11/2019   IR EXCHANGE BILIARY DRAIN  04/14/2019   IR EXCHANGE BILIARY DRAIN  06/09/2019   IR EXCHANGE BILIARY DRAIN  11/26/2019   IR EXCHANGE BILIARY DRAIN  12/23/2019   IR EXCHANGE BILIARY DRAIN  02/22/2020   IR EXCHANGE BILIARY DRAIN  05/25/2020   IR EXCHANGE BILIARY DRAIN  07/29/2020   IR EXCHANGE BILIARY DRAIN  08/30/2020   IR EXCHANGE BILIARY DRAIN  10/06/2020   IR EXCHANGE BILIARY DRAIN  11/18/2020   IR EXCHANGE BILIARY DRAIN  12/21/2020   IR EXCHANGE BILIARY DRAIN  02/03/2021   IR EXCHANGE BILIARY DRAIN  03/07/2021   IR EXCHANGE  BILIARY DRAIN  05/04/2021   IR EXCHANGE BILIARY DRAIN  06/22/2021   IR EXCHANGE BILIARY DRAIN  07/04/2021   IR EXCHANGE BILIARY DRAIN  08/10/2021   IR PERC CHOLECYSTOSTOMY  01/27/2017   IR PERC CHOLECYSTOSTOMY  11/18/2019   IR RADIOLOGIST EVAL & MGMT  03/05/2017   IR SINUS/FIST TUBE CHK-NON GI  10/06/2020   MASTECTOMY, PARTIAL Right     Social History   Socioeconomic History   Marital status: Widowed    Spouse name: Not on file   Number of children: Not on file   Years of education: Not on file   Highest education level: Not on file  Occupational History   Occupation: retired  Tobacco Use   Smoking status: Never   Smokeless tobacco: Never  Vaping Use   Vaping Use: Never used  Substance and Sexual Activity   Alcohol use: No   Drug use: No   Sexual activity: Not Currently  Other Topics Concern   Not on file  Social History Narrative   Long [facility] resident of [facility]    Social Determinants of Health   Financial Resource Strain: Low Risk  (07/21/2018)   Overall Financial Resource Strain (CARDIA)  Difficulty of Paying Living Expenses: Not hard at all  Food Insecurity: No Food Insecurity (07/21/2018)   Hunger Vital Sign    Worried About Running Out of Food in the Last Year: Never true    Ran Out of Food in the Last Year: Never true  Transportation Needs: No Transportation Needs (07/21/2018)   PRAPARE - Hydrologist (Medical): No    Lack of Transportation (Non-Medical): No  Physical Activity: Inactive (07/21/2018)   Exercise Vital Sign    Days of Exercise per Week: 0 days    Minutes of Exercise per Session: 0 min  Stress: No Stress Concern Present (07/21/2018)   [place]    Feeling of Stress : Not at all  Social Connections: Socially Isolated (07/21/2018)   Social Connection and Isolation Panel [NHANES]    Frequency of Communication with Friends and Family: Never    Frequency of Social  Gatherings with Friends and Family: Never    Attends Religious Services: Never    Marine scientist or Organizations: No    Attends Archivist Meetings: Never    Marital Status: Widowed  Intimate Partner Violence: Not At Risk (07/21/2018)   Humiliation, Afraid, Rape, and Kick questionnaire    Fear of Current or Ex-Partner: No    Emotionally Abused: No    Physically Abused: No    Sexually Abused: No   Family History  Problem Relation Age of Onset   Cancer Father        ? primary   Diabetes Neg Hx    Heart disease Neg Hx    Stroke Neg Hx       VITAL SIGNS BP (!) 150/69   Pulse 85   Temp (!) 96.3 F (35.7 C)   Resp 18   Ht '5\' 3"'$  (1.6 m)   Wt 133 lb 6.4 oz (60.5 kg)   SpO2 98%   BMI 23.63 kg/m   Outpatient Encounter Medications as of 11/19/2022  Medication Sig   acetaminophen (TYLENOL) 325 MG tablet Take 650 mg by mouth every 6 (six) hours as needed.   alendronate (FOSAMAX) 70 MG tablet Take 70 mg by mouth once a week. Take with a full glass of water on an empty stomach.   Balsam [place]-Castor Oil (VENELEX) OINT Apply topically. Apply to bilateral buttocks qshift for erythema.   calcium carbonate (TUMS - DOSED IN MG ELEMENTAL CALCIUM) 500 MG chewable tablet Chew 1 tablet by mouth daily.   cholecalciferol (VITAMIN D) 25 MCG (1000 UNIT) tablet Take 1,000 Units by mouth daily.   Cyanocobalamin (VITAMIN B-12) 1000 MCG SUBL Place 1 tablet under the tongue at bedtime.   donepezil (ARICEPT) 10 MG tablet Take 1 tablet by mouth at bedtime.   LORazepam (ATIVAN) 0.5 MG tablet Take 0.5 mg by mouth daily as needed for anxiety.   NON FORMULARY Diet: Dysphagia 3 thin liquids   [DISCONTINUED] Nutritional Supplements (ENSURE ENLIVE PO) Take 1.5 kcal by mouth daily.   No facility-administered encounter medications on file as of 11/19/2022.     SIGNIFICANT DIAGNOSTIC EXAMS       ASSESSMENT/ PLAN:     Ok Edwards NP [facility]  Contact [phone]  Monday through Friday 8am- 5pm  After hours call [phone]

## 2022-12-10 ENCOUNTER — Non-Acute Institutional Stay (SKILLED_NURSING_FACILITY): Payer: Medicare Other | Admitting: Internal Medicine

## 2022-12-10 ENCOUNTER — Encounter: Payer: Self-pay | Admitting: Internal Medicine

## 2022-12-10 DIAGNOSIS — F0390 Unspecified dementia without behavioral disturbance: Secondary | ICD-10-CM

## 2022-12-10 DIAGNOSIS — E538 Deficiency of other specified B group vitamins: Secondary | ICD-10-CM

## 2022-12-10 DIAGNOSIS — E034 Atrophy of thyroid (acquired): Secondary | ICD-10-CM

## 2022-12-10 DIAGNOSIS — E441 Mild protein-calorie malnutrition: Secondary | ICD-10-CM

## 2022-12-10 DIAGNOSIS — N182 Chronic kidney disease, stage 2 (mild): Secondary | ICD-10-CM

## 2022-12-10 NOTE — Patient Instructions (Signed)
See assessment and plan under each diagnosis in the problem list and acutely for this visit 

## 2022-12-10 NOTE — Progress Notes (Unsigned)
NURSING HOME LOCATION:  [facility] ROOM NUMBER:  141 D  CODE STATUS:  DNR  PCP:  Ok Edwards NP  This is a nursing facility follow up visit of chronic medical diagnoses & to document compliance with Regulation 483.30 (c) in The [place] Manual Phase 2 which mandates caregiver visit ( visits can alternate among physician, PA or NP as per statutes) within 10 days of 30 days / 60 days/ 90 days post admission to SNF date    Interim medical record and care since last SNF visit was updated with review of diagnostic studies and change in clinical status since last visit were documented.  HPI: She is a permanent resident of this facility with medical diagnoses of aortic atherosclerosis, history of breast cancer essential hypertension, hypothyroidism, dementia, and B12 deficiency. She has had a phenomenal number of biliary drain exchanges; she is also had a partial mastectomy.  Most recent labs on record were 07/30/2022 revealing mild hypocalcemia with a value of 8.5.  Albumin was 3 and total protein low normal at 6.5.  There has been slight progression of anemia with H/H of 11.9/37.4, down from prior values of 12.6/39.1.  She is on B12 supplement; her most recent B12 level was therapeutic at 636. She has a history of hypothyroidism but is on no thyroid supplement.The most recent TSH on record was 3.26 on 01/23/2021.  Peak TSH had been 5.568 in January 2019.  Review of systems: Dementia invalidated responses.  She had no idea who I was despite the fact that we have met multiple occasions.  She was shocked to learn that I had been here 7 years.  She could not provide the day in 09-22-1925 she was born.  She confabulated nonsensical bili.  When asked how she were doing her response was "still here."  She stated that she is happy as all she has do is eat when the prepare meals arrive.  She states that if she gets cool she will get up and exercise is walking.  She states  that when walking 1 month saying for to be worthwhile.  Review of systems was otherwise negative.   Physical exam:  Pertinent or positive findings: She appears her stated age.  Facies exhibit puzzle meant.  The lower lids are slightly puffy.  Teeth are malaligned.  S4 is present.  Abdomen is protuberant.  Pedal pulses are decreased.  Feet are cool without cyanosis.  General appearance: Adequately nourished; no acute distress, increased work of breathing is present.   Lymphatic: No lymphadenopathy about the head, neck, axilla. Eyes: No conjunctival inflammation or lid edema is present. There is no scleral icterus. Ears:  External ear exam shows no significant lesions or deformities.   Nose:  External nasal examination shows no deformity or inflammation. Nasal mucosa are pink and moist without lesions, exudates Oral exam:  Lips and gums are healthy appearing. There is no oropharyngeal erythema or exudate. Neck:  No thyromegaly, masses, tenderness noted.    Heart:  Normal rate and regular rhythm. S1 and S2 normal without gallop, murmur, click, rub .  Lungs: Chest clear to auscultation without wheezes, rhonchi, rales, rubs. Abdomen: Bowel sounds are normal. Abdomen is soft and nontender with no organomegaly, hernias, masses. GU: Deferred  Extremities:  No cyanosis, clubbing, edema  Neurologic exam : Cn 2-7 intact Strength equal  in upper & lower extremities Balance, Rhomberg, finger to nose testing could not be completed due to clinical state Deep  tendon reflexes are equal Skin: Warm & dry w/o tenting. No significant lesions or rash.  See summary under each active problem in the Problem List with associated updated therapeutic plan

## 2022-12-10 NOTE — Assessment & Plan Note (Signed)
She is not on thyroid supplement; the most recent TSH on record was 3.26 on 01/23/2021.  TSH update can be completed with the next blood draw.

## 2022-12-10 NOTE — Assessment & Plan Note (Signed)
No nephrotoxic agents documented in med list.  Renal function is stable stage II.

## 2022-12-10 NOTE — Assessment & Plan Note (Signed)
Albumin is reduced at 3 and total protein is low normal at 6.5.  Some limb atrophy is present.  Nutritionist to continue to follow at the SNF.

## 2022-12-10 NOTE — Assessment & Plan Note (Addendum)
Most recent B12 level was 636 on 1000 mcg of B12 supplementation.  This can be updated annually as serial B12 levels have been within normal limits. Most recent H/H was 11.9/37.4 with normochromic normocytic indices.  This was down from prior values of 12.6/39.1.  No bleeding dyscrasias reported at the SNF.  CBC will be monitored.

## 2022-12-11 NOTE — Assessment & Plan Note (Signed)
As is consistent finding , she confabulates w/o focus. No behavioral issues reported by staff.

## 2022-12-24 DIAGNOSIS — M2041 Other hammer toe(s) (acquired), right foot: Secondary | ICD-10-CM | POA: Diagnosis not present

## 2022-12-24 DIAGNOSIS — I739 Peripheral vascular disease, unspecified: Secondary | ICD-10-CM | POA: Diagnosis not present

## 2022-12-24 DIAGNOSIS — M2042 Other hammer toe(s) (acquired), left foot: Secondary | ICD-10-CM | POA: Diagnosis not present

## 2022-12-24 DIAGNOSIS — B351 Tinea unguium: Secondary | ICD-10-CM | POA: Diagnosis not present

## 2023-01-09 ENCOUNTER — Other Ambulatory Visit: Payer: Self-pay | Admitting: Interventional Radiology

## 2023-01-09 DIAGNOSIS — K811 Chronic cholecystitis: Secondary | ICD-10-CM

## 2023-01-10 ENCOUNTER — Encounter: Payer: Self-pay | Admitting: Adult Health

## 2023-01-10 ENCOUNTER — Other Ambulatory Visit: Payer: Self-pay | Admitting: Interventional Radiology

## 2023-01-10 ENCOUNTER — Non-Acute Institutional Stay (SKILLED_NURSING_FACILITY): Payer: Medicare Other | Admitting: Adult Health

## 2023-01-10 DIAGNOSIS — E441 Mild protein-calorie malnutrition: Secondary | ICD-10-CM | POA: Diagnosis not present

## 2023-01-10 DIAGNOSIS — D649 Anemia, unspecified: Secondary | ICD-10-CM

## 2023-01-10 DIAGNOSIS — B009 Herpesviral infection, unspecified: Secondary | ICD-10-CM | POA: Diagnosis not present

## 2023-01-10 DIAGNOSIS — K811 Chronic cholecystitis: Secondary | ICD-10-CM

## 2023-01-10 NOTE — Progress Notes (Signed)
Location:  [place] Room Number: 141 Place of Service:  SNF (31)   CODE STATUS: dnr  No Known Allergies  Chief Complaint  Patient presents with   Medical Management of Chronic Issues                  Herpes:  Protein calorie malnutrition:  Chronic anemia     HPI:  She is a 89 year old long term resident of this facility being seen for the management of her chronic illnesses: Herpes:  Protein calorie malnutrition:  Chronic anemia. There are no reports of uncontrolled pain; no changes in appetite; weight is stable.   Past Medical History:  Diagnosis Date   Aortic atherosclerosis ([place]) 11/15/2020   Breast cancer ([place])    Cognitive communication deficit    Dementia ([place])    Difficulty in walking(719.7)    Fall    HTN (hypertension) 08/26/2016   Muscle weakness (generalized)    Rash and nonspecific skin eruption    Thyroid disease    hypo   Urinary tract infection, site not specified    Vitamin B deficiency     Past Surgical History:  Procedure Laterality Date   COLON SURGERY     IR CATHETER TUBE CHANGE  05/07/2017   IR EXCHANGE BILIARY DRAIN  07/02/2017   IR EXCHANGE BILIARY DRAIN  07/10/2017   IR EXCHANGE BILIARY DRAIN  08/28/2017   IR EXCHANGE BILIARY DRAIN  10/11/2017   IR EXCHANGE BILIARY DRAIN  10/28/2017   IR EXCHANGE BILIARY DRAIN  12/23/2017   IR EXCHANGE BILIARY DRAIN  01/23/2018   IR EXCHANGE BILIARY DRAIN  02/17/2018   IR EXCHANGE BILIARY DRAIN  03/20/2018   IR EXCHANGE BILIARY DRAIN  04/17/2018   IR EXCHANGE BILIARY DRAIN  05/29/2018   IR EXCHANGE BILIARY DRAIN  07/24/2018   IR EXCHANGE BILIARY DRAIN  09/22/2018   IR EXCHANGE BILIARY DRAIN  11/20/2018   IR EXCHANGE BILIARY DRAIN  02/11/2019   IR EXCHANGE BILIARY DRAIN  04/14/2019   IR EXCHANGE BILIARY DRAIN  06/09/2019   IR EXCHANGE BILIARY DRAIN  11/26/2019   IR EXCHANGE BILIARY DRAIN  12/23/2019   IR EXCHANGE BILIARY DRAIN  02/22/2020   IR EXCHANGE BILIARY DRAIN  05/25/2020   IR EXCHANGE BILIARY  DRAIN  07/29/2020   IR EXCHANGE BILIARY DRAIN  08/30/2020   IR EXCHANGE BILIARY DRAIN  10/06/2020   IR EXCHANGE BILIARY DRAIN  11/18/2020   IR EXCHANGE BILIARY DRAIN  12/21/2020   IR EXCHANGE BILIARY DRAIN  02/03/2021   IR EXCHANGE BILIARY DRAIN  03/07/2021   IR EXCHANGE BILIARY DRAIN  05/04/2021   IR EXCHANGE BILIARY DRAIN  06/22/2021   IR EXCHANGE BILIARY DRAIN  07/04/2021   IR EXCHANGE BILIARY DRAIN  08/10/2021   IR PERC CHOLECYSTOSTOMY  01/27/2017   IR PERC CHOLECYSTOSTOMY  11/18/2019   IR RADIOLOGIST EVAL & MGMT  03/05/2017   IR SINUS/FIST TUBE CHK-NON GI  10/06/2020   MASTECTOMY, PARTIAL Right     Social History   Socioeconomic History   Marital status: Widowed    Spouse name: Not on file   Number of children: Not on file   Years of education: Not on file   Highest education level: Not on file  Occupational History   Occupation: retired  Tobacco Use   Smoking status: Never   Smokeless tobacco: Never  Vaping Use   Vaping Use: Never used  Substance and Sexual Activity   Alcohol use:  No   Drug use: No   Sexual activity: Not Currently  Other Topics Concern   Not on file  Social History Narrative   Long term resident of [facility]    Social Determinants of Health   Financial Resource Strain: Low Risk  (07/21/2018)   Overall Financial Resource Strain (CARDIA)    Difficulty of Paying Living Expenses: Not hard at all  Food Insecurity: No Food Insecurity (07/21/2018)   Hunger Vital Sign    Worried About Running Out of Food in the Last Year: Never true    Ran Out of Food in the Last Year: Never true  Transportation Needs: No Transportation Needs (07/21/2018)   PRAPARE - Hydrologist (Medical): No    Lack of Transportation (Non-Medical): No  Physical Activity: Inactive (07/21/2018)   Exercise Vital Sign    Days of Exercise per Week: 0 days    Minutes of Exercise per Session: 0 min  Stress: No Stress Concern Present (07/21/2018)   [place]    Feeling of Stress : Not at all  Social Connections: Socially Isolated (07/21/2018)   Social Connection and Isolation Panel [NHANES]    Frequency of Communication with Friends and Family: Never    Frequency of Social Gatherings with Friends and Family: Never    Attends Religious Services: Never    Marine scientist or Organizations: No    Attends Archivist Meetings: Never    Marital Status: Widowed  Intimate Partner Violence: Not At Risk (07/21/2018)   Humiliation, Afraid, Rape, and Kick questionnaire    Fear of Current or Ex-Partner: No    Emotionally Abused: No    Physically Abused: No    Sexually Abused: No   Family History  Problem Relation Age of Onset   Cancer Father        ? primary   Diabetes Neg Hx    Heart disease Neg Hx    Stroke Neg Hx       VITAL SIGNS BP 124/72   Pulse 80   Temp 97.6 F (36.4 C)   Resp 20   Ht 5\' 3"  (1.6 m)   Wt 127 lb 6.4 oz (57.8 kg)   SpO2 99%   BMI 22.57 kg/m   Outpatient Encounter Medications as of 01/10/2023  Medication Sig   acetaminophen (TYLENOL) 325 MG tablet Take 650 mg by mouth every 6 (six) hours as needed.   alendronate (FOSAMAX) 70 MG tablet Take 70 mg by mouth once a week. Take with a full glass of water on an empty stomach.   Balsam [place]-Castor Oil (VENELEX) OINT Apply topically. Apply to bilateral buttocks qshift for erythema.   calcium carbonate (TUMS - DOSED IN MG ELEMENTAL CALCIUM) 500 MG chewable tablet Chew 1 tablet by mouth daily.   cholecalciferol (VITAMIN D) 25 MCG (1000 UNIT) tablet Take 1,000 Units by mouth daily.   Cyanocobalamin (VITAMIN B-12) 1000 MCG SUBL Place 1 tablet under the tongue at bedtime.   donepezil (ARICEPT) 10 MG tablet Take 1 tablet by mouth at bedtime.   LORazepam (ATIVAN) 0.5 MG tablet Take 0.5 mg by mouth daily as needed for anxiety.   NON FORMULARY Diet: Dysphagia 3 thin liquids   No facility-administered encounter  medications on file as of 01/10/2023.     SIGNIFICANT DIAGNOSTIC EXAMS  DIAGNOSTIC EXAMS  PREVIOUS   06-06-22: dexa scan: t score -2.464    LABS REVIEWED: PREVIOUS:  12-26-21: wbc 6.4; hgb 12.4; hct 39.1; mcv 99.5 plt 397; glucose 80; bun 15; creat 0.88; k+ 4.0; na++ 139; ca 9.2; GFR>60  07-30-22: wbc 4.9; hgb 11.9; hct 37.4; mcv 97.9 plt 262; glucose 77; bun 16; creat 0.81; k+ 4.1; na++ 142; ca 8.5 gfr >60; protein 6.5 albumin 3.0; vitamin B 12: 636; vitamin D 51.61  NO NEW LABS.    Review of Systems  Constitutional:  Negative for malaise/fatigue.  Respiratory:  Negative for cough and shortness of breath.   Cardiovascular:  Negative for chest pain, palpitations and leg swelling.  Gastrointestinal:  Negative for abdominal pain, constipation and heartburn.  Musculoskeletal:  Negative for back pain, joint pain and myalgias.  Skin: Negative.   Neurological:  Negative for dizziness.  Psychiatric/Behavioral:  The patient is not nervous/anxious.     Physical Exam Constitutional:      General: She is not in acute distress.    Appearance: She is well-developed. She is not diaphoretic.  Neck:     Thyroid: No thyromegaly.  Cardiovascular:     Rate and Rhythm: Normal rate and regular rhythm.     Pulses: Normal pulses.     Heart sounds: Normal heart sounds.  Pulmonary:     Effort: Pulmonary effort is normal. No respiratory distress.     Breath sounds: Normal breath sounds.  Chest:     Comments: Right partial mastectomy Abdominal:     General: Bowel sounds are normal. There is no distension.     Palpations: Abdomen is soft.     Tenderness: There is no abdominal tenderness.  Musculoskeletal:        General: Normal range of motion.     Cervical back: Neck supple.     Right lower leg: No edema.     Left lower leg: No edema.  Lymphadenopathy:     Cervical: No cervical adenopathy.  Skin:    General: Skin is warm and dry.  Neurological:     Mental Status: She is alert. Mental  status is at baseline.  Psychiatric:        Mood and Affect: Mood normal.         ASSESSMENT/ PLAN:  TODAY  Herpes: without out break is off valtrex  2. Protein calorie malnutrition: albumin 3.0 will continue supplements as directed  3. Chronic anemia: hgb 11.9   PREVIOUS    4. Aortic atherosclerosis (ct 08-26-19); is not on statin due to advanced age  50. Major neurocognitive disorder/vascular dementia without behavioral disturbance: weight is 133 pounds; weight remains stable; will continue aricept 10 mg daily   6. Primary hypertension: b/p 124/72    7. Calculus of gall bladder without acuate cholecystitis and obstruction: her chole drain is out at this time  8. Chronic constipation: will continue miralax daily as needed  9. Post menopausal osteoporosis: t score -2.464 will monitor     Ok Edwards NP [facility]   call [phone]

## 2023-01-22 ENCOUNTER — Other Ambulatory Visit: Payer: Self-pay | Admitting: Adult Health

## 2023-01-22 MED ORDER — ALPRAZOLAM 0.5 MG PO TABS: 0.5000 mg | ORAL_TABLET | Freq: Every day | ORAL | 0 refills | Status: DC | PRN

## 2023-01-23 ENCOUNTER — Non-Acute Institutional Stay (SKILLED_NURSING_FACILITY): Payer: Medicare Other | Admitting: Adult Health

## 2023-01-23 ENCOUNTER — Ambulatory Visit
Admission: RE | Admit: 2023-01-23 | Discharge: 2023-01-23 | Disposition: A | Discharge status: Home / Self Care | Payer: Medicare Other | Source: Ambulatory Visit | Attending: Interventional Radiology | Admitting: Interventional Radiology

## 2023-01-23 DIAGNOSIS — K5730 Diverticulosis of large intestine without perforation or abscess without bleeding: Secondary | ICD-10-CM | POA: Diagnosis not present

## 2023-01-23 DIAGNOSIS — N261 Atrophy of kidney (terminal): Secondary | ICD-10-CM | POA: Diagnosis not present

## 2023-01-23 DIAGNOSIS — I700 Atherosclerosis of aorta: Secondary | ICD-10-CM | POA: Diagnosis not present

## 2023-01-23 DIAGNOSIS — R634 Abnormal weight loss: Secondary | ICD-10-CM | POA: Diagnosis not present

## 2023-01-23 DIAGNOSIS — F0390 Unspecified dementia without behavioral disturbance: Secondary | ICD-10-CM

## 2023-01-23 DIAGNOSIS — Z9049 Acquired absence of other specified parts of digestive tract: Secondary | ICD-10-CM | POA: Diagnosis not present

## 2023-01-23 DIAGNOSIS — F0150 Vascular dementia without behavioral disturbance: Secondary | ICD-10-CM | POA: Diagnosis not present

## 2023-01-23 DIAGNOSIS — K811 Chronic cholecystitis: Secondary | ICD-10-CM

## 2023-01-23 DIAGNOSIS — K8020 Calculus of gallbladder without cholecystitis without obstruction: Secondary | ICD-10-CM | POA: Diagnosis not present

## 2023-01-23 DIAGNOSIS — Z4803 Encounter for change or removal of drains: Secondary | ICD-10-CM | POA: Diagnosis not present

## 2023-01-23 HISTORY — PX: IR RADIOLOGIST EVAL & MGMT: IMG5224

## 2023-01-23 MED ORDER — IOPAMIDOL (ISOVUE-300) INJECTION 61%
100.0000 mL | Freq: Once | INTRAVENOUS | Status: AC | PRN
  Administered 2023-01-23: 100 mL via INTRAVENOUS

## 2023-01-23 NOTE — Progress Notes (Signed)
Location:  [facility] Room Number: 141-D Place of Service:  SNF (31)   CODE STATUS: DNR    Chief Complaint  Patient presents with   Acute Visit    Weight loss.    HPI:  She has been losing weight. Six months ago she weighed 137 pounds with her weight in April of 127.2 pounds representing a 10 pound loss in 6 months. Her appetite is variable with a majority at <50%. She does spend a great deal of her time in her room per her choice. She does not eat in the dining room.   Past Medical History:  Diagnosis Date   Aortic atherosclerosis 11/15/2020   Breast cancer    Cognitive communication deficit    Dementia    Difficulty in walking(719.7)    Fall    HTN (hypertension) 08/26/2016   Muscle weakness (generalized)    Rash and nonspecific skin eruption    Thyroid disease    hypo   Urinary tract infection, site not specified    Vitamin B deficiency     Past Surgical History:  Procedure Laterality Date   COLON SURGERY     IR CATHETER TUBE CHANGE  05/07/2017   IR EXCHANGE BILIARY DRAIN  07/02/2017   IR EXCHANGE BILIARY DRAIN  07/10/2017   IR EXCHANGE BILIARY DRAIN  08/28/2017   IR EXCHANGE BILIARY DRAIN  10/11/2017   IR EXCHANGE BILIARY DRAIN  10/28/2017   IR EXCHANGE BILIARY DRAIN  12/23/2017   IR EXCHANGE BILIARY DRAIN  01/23/2018   IR EXCHANGE BILIARY DRAIN  02/17/2018   IR EXCHANGE BILIARY DRAIN  03/20/2018   IR EXCHANGE BILIARY DRAIN  04/17/2018   IR EXCHANGE BILIARY DRAIN  05/29/2018   IR EXCHANGE BILIARY DRAIN  07/24/2018   IR EXCHANGE BILIARY DRAIN  09/22/2018   IR EXCHANGE BILIARY DRAIN  11/20/2018   IR EXCHANGE BILIARY DRAIN  02/11/2019   IR EXCHANGE BILIARY DRAIN  04/14/2019   IR EXCHANGE BILIARY DRAIN  06/09/2019   IR EXCHANGE BILIARY DRAIN  11/26/2019   IR EXCHANGE BILIARY DRAIN  12/23/2019   IR EXCHANGE BILIARY DRAIN  02/22/2020   IR EXCHANGE BILIARY DRAIN  05/25/2020   IR EXCHANGE BILIARY DRAIN  07/29/2020   IR EXCHANGE BILIARY DRAIN  08/30/2020   IR EXCHANGE  BILIARY DRAIN  10/06/2020   IR EXCHANGE BILIARY DRAIN  11/18/2020   IR EXCHANGE BILIARY DRAIN  12/21/2020   IR EXCHANGE BILIARY DRAIN  02/03/2021   IR EXCHANGE BILIARY DRAIN  03/07/2021   IR EXCHANGE BILIARY DRAIN  05/04/2021   IR EXCHANGE BILIARY DRAIN  06/22/2021   IR EXCHANGE BILIARY DRAIN  07/04/2021   IR EXCHANGE BILIARY DRAIN  08/10/2021   IR PERC CHOLECYSTOSTOMY  01/27/2017   IR PERC CHOLECYSTOSTOMY  11/18/2019   IR RADIOLOGIST EVAL & MGMT  03/05/2017   IR RADIOLOGIST EVAL & MGMT  01/23/2023   IR SINUS/FIST TUBE CHK-NON GI  10/06/2020   MASTECTOMY, PARTIAL Right     Social History   Socioeconomic History   Marital status: Widowed    Spouse name: Not on file   Number of children: Not on file   Years of education: Not on file   Highest education level: Not on file  Occupational History   Occupation: retired  Tobacco Use   Smoking status: Never   Smokeless tobacco: Never  Vaping Use   Vaping Use: Never used  Substance and Sexual Activity   Alcohol use: No   Drug  use: No   Sexual activity: Not Currently  Other Topics Concern   Not on file  Social History Narrative   Long term resident of [facility]    Social Determinants of Health   Financial Resource Strain: Low Risk  (07/21/2018)   Overall Financial Resource Strain (CARDIA)    Difficulty of Paying Living Expenses: Not hard at all  Food Insecurity: No Food Insecurity (07/21/2018)   Hunger Vital Sign    Worried About Running Out of Food in the Last Year: Never true    Ran Out of Food in the Last Year: Never true  Transportation Needs: No Transportation Needs (07/21/2018)   PRAPARE - Administrator, Civil Service (Medical): No    Lack of Transportation (Non-Medical): No  Physical Activity: Inactive (07/21/2018)   Exercise Vital Sign    Days of Exercise per Week: 0 days    Minutes of Exercise per Session: 0 min  Stress: No Stress Concern Present (07/21/2018)   [employer] of Occupational Health - Occupational Stress  Questionnaire    Feeling of Stress : Not at all  Social Connections: Socially Isolated (07/21/2018)   Social Connection and Isolation Panel [NHANES]    Frequency of Communication with Friends and Family: Never    Frequency of Social Gatherings with Friends and Family: Never    Attends Religious Services: Never    Database administrator or Organizations: No    Attends Banker Meetings: Never    Marital Status: Widowed  Intimate Partner Violence: Not At Risk (07/21/2018)   Humiliation, Afraid, Rape, and Kick questionnaire    Fear of Current or Ex-Partner: No    Emotionally Abused: No    Physically Abused: No    Sexually Abused: No   Family History  Problem Relation Age of Onset   Cancer Father        ? primary   Diabetes Neg Hx    Heart disease Neg Hx    Stroke Neg Hx       VITAL SIGNS BP 128/74   Pulse 75   Temp 97.6 F (36.4 C)   Resp 20   Ht 5\' 3"  (1.6 m)   Wt 127 lb 3.2 oz (57.7 kg)   SpO2 100%   BMI 22.53 kg/m   Outpatient Encounter Medications as of 01/23/2023  Medication Sig   Balsam [place]-Castor Oil (VENELEX) OINT Apply topically. Apply to bilateral buttocks qshift for erythema.   donepezil (ARICEPT) 10 MG tablet Take 1 tablet by mouth at bedtime.   [DISCONTINUED] acetaminophen (TYLENOL) 325 MG tablet Take 650 mg by mouth every 6 (six) hours as needed.   [DISCONTINUED] alendronate (FOSAMAX) 70 MG tablet Take 70 mg by mouth once a week. Take with a full glass of water on an empty stomach.   [DISCONTINUED] ALPRAZolam (XANAX) 0.5 MG tablet Take 1 tablet (0.5 mg total) by mouth daily as needed for anxiety.   [DISCONTINUED] calcium carbonate (TUMS - DOSED IN MG ELEMENTAL CALCIUM) 500 MG chewable tablet Chew 1 tablet by mouth daily.   [DISCONTINUED] cholecalciferol (VITAMIN D) 25 MCG (1000 UNIT) tablet Take 1,000 Units by mouth daily.   [DISCONTINUED] Cyanocobalamin (VITAMIN B-12) 1000 MCG SUBL Place 1 tablet under the tongue at bedtime.   [DISCONTINUED]  LORazepam (ATIVAN) 0.5 MG tablet Take 0.5 mg by mouth daily as needed for anxiety.   [DISCONTINUED] NON FORMULARY Diet: Dysphagia 3 thin liquids   No facility-administered encounter medications on file as of 01/23/2023.  SIGNIFICANT DIAGNOSTIC EXAMS  DIAGNOSTIC EXAMS  PREVIOUS   06-06-22: dexa scan: t score -2.464    LABS REVIEWED: PREVIOUS:    07-30-22: wbc 4.9; hgb 11.9; hct 37.4; mcv 97.9 plt 262; glucose 77; bun 16; creat 0.81; k+ 4.1; na++ 142; ca 8.5 gfr >60; protein 6.5 albumin 3.0; vitamin B 12: 636; vitamin D 51.61  NO NEW LABS.    Review of Systems  Constitutional:  Negative for malaise/fatigue.  Respiratory:  Negative for cough and shortness of breath.   Cardiovascular:  Negative for chest pain, palpitations and leg swelling.  Gastrointestinal:  Negative for abdominal pain, constipation and heartburn.  Musculoskeletal:  Negative for back pain, joint pain and myalgias.  Skin: Negative.   Neurological:  Negative for dizziness.  Psychiatric/Behavioral:  The patient is not nervous/anxious.     Physical Exam Constitutional:      General: She is not in acute distress.    Appearance: She is well-developed. She is not diaphoretic.  Neck:     Thyroid: No thyromegaly.  Cardiovascular:     Rate and Rhythm: Normal rate and regular rhythm.     Pulses: Normal pulses.     Heart sounds: Normal heart sounds.  Pulmonary:     Effort: Pulmonary effort is normal. No respiratory distress.     Breath sounds: Normal breath sounds.  Chest:     Comments: Right partial mastectomy Abdominal:     General: Bowel sounds are normal. There is no distension.     Palpations: Abdomen is soft.     Tenderness: There is no abdominal tenderness.  Musculoskeletal:        General: Normal range of motion.     Cervical back: Neck supple.     Right lower leg: No edema.     Left lower leg: No edema.  Lymphadenopathy:     Cervical: No cervical adenopathy.  Skin:    General: Skin is warm and  dry.  Neurological:     Mental Status: She is alert. Mental status is at baseline.  Psychiatric:        Mood and Affect: Mood normal.       ASSESSMENT/ PLAN:  TODAY  Weight loss unintentional  Major neurocognitive disorder Vascular dementia without behavioral disturbance  Will begin her on magic cup and protein cookies to build up her weight Will monitor    Synthia Innocent NP [facility]  call [phone]

## 2023-01-23 NOTE — Consult Note (Signed)
Chief Complaint: Patient was seen in consultation today for history of cholecystostomy drain  History of Present Illness: Cynthia [name] is a 89 y.o. female with history of calculous cholecystitis status post percutaneous cholecystostomy drains. She presents today at request of her assisted living facility due to leakage at prior drain site.  CT abdomen pelvis was obtained in clinic today.  Due to the patients dementia, she is a poor historian.  She states she has no problems with the prior drain site, it doesn't leak, and is closed.    Past Medical History:  Diagnosis Date   Aortic atherosclerosis ([place]) 11/15/2020   Breast cancer ([place])    Cognitive communication deficit    Dementia ([place])    Difficulty in walking(719.7)    Fall    HTN (hypertension) 08/26/2016   Muscle weakness (generalized)    Rash and nonspecific skin eruption    Thyroid disease    hypo   Urinary tract infection, site not specified    Vitamin B deficiency     Past Surgical History:  Procedure Laterality Date   COLON SURGERY     IR CATHETER TUBE CHANGE  05/07/2017   IR EXCHANGE BILIARY DRAIN  07/02/2017   IR EXCHANGE BILIARY DRAIN  07/10/2017   IR EXCHANGE BILIARY DRAIN  08/28/2017   IR EXCHANGE BILIARY DRAIN  10/11/2017   IR EXCHANGE BILIARY DRAIN  10/28/2017   IR EXCHANGE BILIARY DRAIN  12/23/2017   IR EXCHANGE BILIARY DRAIN  01/23/2018   IR EXCHANGE BILIARY DRAIN  02/17/2018   IR EXCHANGE BILIARY DRAIN  03/20/2018   IR EXCHANGE BILIARY DRAIN  04/17/2018   IR EXCHANGE BILIARY DRAIN  05/29/2018   IR EXCHANGE BILIARY DRAIN  07/24/2018   IR EXCHANGE BILIARY DRAIN  09/22/2018   IR EXCHANGE BILIARY DRAIN  11/20/2018   IR EXCHANGE BILIARY DRAIN  02/11/2019   IR EXCHANGE BILIARY DRAIN  04/14/2019   IR EXCHANGE BILIARY DRAIN  06/09/2019   IR EXCHANGE BILIARY DRAIN  11/26/2019   IR EXCHANGE BILIARY DRAIN  12/23/2019   IR EXCHANGE BILIARY DRAIN  02/22/2020   IR EXCHANGE BILIARY DRAIN  05/25/2020   IR EXCHANGE BILIARY DRAIN   07/29/2020   IR EXCHANGE BILIARY DRAIN  08/30/2020   IR EXCHANGE BILIARY DRAIN  10/06/2020   IR EXCHANGE BILIARY DRAIN  11/18/2020   IR EXCHANGE BILIARY DRAIN  12/21/2020   IR EXCHANGE BILIARY DRAIN  02/03/2021   IR EXCHANGE BILIARY DRAIN  03/07/2021   IR EXCHANGE BILIARY DRAIN  05/04/2021   IR EXCHANGE BILIARY DRAIN  06/22/2021   IR EXCHANGE BILIARY DRAIN  07/04/2021   IR EXCHANGE BILIARY DRAIN  08/10/2021   IR PERC CHOLECYSTOSTOMY  01/27/2017   IR PERC CHOLECYSTOSTOMY  11/18/2019   IR RADIOLOGIST EVAL & MGMT  03/05/2017   IR SINUS/FIST TUBE CHK-NON GI  10/06/2020   MASTECTOMY, PARTIAL Right     Allergies: Patient has no known allergies.  Medications: Prior to Admission medications   Medication Sig Start Date End Date Taking? Authorizing Provider  acetaminophen (TYLENOL) 325 MG tablet Take 650 mg by mouth every 6 (six) hours as needed.    [provider]  alendronate (FOSAMAX) 70 MG tablet Take 70 mg by mouth once a week. Take with a full glass of water on an empty stomach.    [provider]  ALPRAZolam Duanne Moron) 0.5 MG tablet Take 1 tablet (0.5 mg total) by mouth daily as needed for anxiety. 01/22/23   [name],  NP  Balsam [place]-Castor Oil (VENELEX) OINT Apply topically. Apply to bilateral buttocks qshift for erythema.    [provider]  calcium carbonate (TUMS - DOSED IN MG ELEMENTAL CALCIUM) 500 MG chewable tablet Chew 1 tablet by mouth daily.    [provider]  cholecalciferol (VITAMIN D) 25 MCG (1000 UNIT) tablet Take 1,000 Units by mouth daily.    [provider]  Cyanocobalamin (VITAMIN B-12) 1000 MCG SUBL Place 1 tablet under the tongue at bedtime.    [provider]  donepezil (ARICEPT) 10 MG tablet Take 1 tablet by mouth at bedtime. 01/24/17   [provider]  LORazepam (ATIVAN) 0.5 MG tablet Take 0.5 mg by mouth daily as needed for anxiety.    [provider]  NON FORMULARY Diet: Dysphagia 3 thin liquids     [provider]     Family History  Problem Relation Age of Onset   Cancer Father        ? primary   Diabetes Neg Hx    Heart disease Neg Hx    Stroke Neg Hx     Social History   Socioeconomic History   Marital status: Widowed    Spouse name: Not on file   Number of children: Not on file   Years of education: Not on file   Highest education level: Not on file  Occupational History   Occupation: retired  Tobacco Use   Smoking status: Never   Smokeless tobacco: Never  Vaping Use   Vaping Use: Never used  Substance and Sexual Activity   Alcohol use: No   Drug use: No   Sexual activity: Not Currently  Other Topics Concern   Not on file  Social History Narrative   [facility] term resident of [facility]    Social Determinants of Health   Financial Resource Strain: [place]  (07/21/2018)   Overall Financial Resource Strain (CARDIA)    Difficulty of Paying Living Expenses: Not hard at all  Food Insecurity: No Food Insecurity (07/21/2018)   Hunger Vital Sign    Worried About Running Out of Food in the Last Year: Never true    [place] in the Last Year: Never true  Transportation Needs: No Transportation Needs (07/21/2018)   PRAPARE - Hydrologist (Medical): No    Lack of Transportation (Non-Medical): No  Physical Activity: Inactive (07/21/2018)   Exercise Vital Sign    Days of Exercise per Week: 0 days    Minutes of Exercise per Session: 0 min  Stress: No Stress Concern Present (07/21/2018)   [place]    Feeling of Stress : Not at all  Social Connections: Socially Isolated (07/21/2018)   Social Connection and Isolation Panel [NHANES]    Frequency of Communication with Friends and Family: Never    Frequency of Social Gatherings with Friends and Family: Never    Attends Religious Services: Never    Marine scientist or Organizations: No    Attends Archivist  Meetings: Never    Marital Status: Widowed    Review of Systems: A 12 point ROS discussed and pertinent positives are indicated in the HPI above.  All other systems are negative.  Vital Signs: BP (!) 129/50 Comment: taken over a sweater. pt refused to take it off  Pulse 64    Physical Exam Constitutional:      General: She is not in acute  distress. HENT:     Head: Normocephalic.     Mouth/Throat:     Mouth: Mucous membranes are moist.  Eyes:     General: No scleral icterus. Cardiovascular:     Rate and Rhythm: Normal rate and regular rhythm.  Pulmonary:     Breath sounds: Normal breath sounds.  Abdominal:     General: There is no distension.     Comments: Small, <1 cm site of moist granulation at prior cholecystostomy skin entry site, no overt evidence of leakage or staining of clothing.  Skin:    General: Skin is warm and dry.  Neurological:     Mental Status: She is alert. Mental status is at baseline.     Imaging: CT AP 01/23/23  Thickening about prior drain track, no organized fluid collections.  Labs:  CBC: Recent Labs    07/30/22 0815  WBC 4.9  HGB 11.9*  HCT 37.4  PLT 262    COAGS: No results for input(s): "INR", "APTT" in the last 8760 hours.  BMP: Recent Labs    07/30/22 0815  NA 142  K 4.1  CL 108  CO2 27  GLUCOSE 77  BUN 16  CALCIUM 8.5*  CREATININE 0.81  GFRNONAA >60    LIVER FUNCTION TESTS: Recent Labs    07/30/22 0815  BILITOT 0.6  AST 15  ALT 9  ALKPHOS 61  PROT 6.5  ALBUMIN 3.0*    TUMOR MARKERS: No results for input(s): "AFPTM", "CEA", "CA199", "CHROMGRNA" in the last 8760 hours.  Assessment and Plan: 89 year old female with history of calculous cholecystitis status post multiple prior cholecystostomy drains, the most recent removed in 2022, presenting for evaluation of skin entry site with reported leakage from assisted living facility. CT today shows possible cholecysto-cutaneous fistula without evidence of acute  cholecystitis or underlying abscess formation.  Physical examination demonstrates moist granulation tissue about the skin entry site without evidence of overt leakage.    I recommend continued conservative management in the absence of acute cholecystitis.  If cholecystitis were to recur, I would recommend additional cholecystostomy entering at a separate skin site to allow decompression and healing.  If leaking is copious or becomes problematic, would recommend Surgical or Wound Care consultation.    Follow up with IR as needed.   Electronically Signed: Suzette Battiest, MD 01/23/2023, 10:31 AM   I spent a total of  30 Minutes  in face to face in clinical consultation, greater than 50% of which was counseling/coordinating care for history of calculous cholecystitis.

## 2023-01-29 DIAGNOSIS — R634 Abnormal weight loss: Secondary | ICD-10-CM | POA: Insufficient documentation

## 2023-02-12 ENCOUNTER — Non-Acute Institutional Stay (SKILLED_NURSING_FACILITY): Payer: Medicare Other | Admitting: Adult Health

## 2023-02-12 ENCOUNTER — Encounter: Payer: Self-pay | Admitting: Adult Health

## 2023-02-12 DIAGNOSIS — D649 Anemia, unspecified: Secondary | ICD-10-CM | POA: Diagnosis not present

## 2023-02-12 DIAGNOSIS — B009 Herpesviral infection, unspecified: Secondary | ICD-10-CM

## 2023-02-12 DIAGNOSIS — E441 Mild protein-calorie malnutrition: Secondary | ICD-10-CM

## 2023-02-12 NOTE — Progress Notes (Signed)
Location:  [facility] Room Number: 141-D Place of Service:  SNF (31) Provider: Synthia Innocent, NP  CODE STATUS: DNR  No Known Allergies  Chief Complaint  Patient presents with   Medical Management of Chronic Issues        Herpes:  Protein calorie malnutrition: Chronic anemia     HPI:  She is a 89 year old long term resident of this facility being seen for the management of her chronic illnesses: Herpes:  Protein calorie malnutrition: Chronic anemia. No reports of uncontrolled pain. She has lost some weight and is now 127 pounds. Her chole drain remains out.   Past Medical History:  Diagnosis Date   Aortic atherosclerosis 11/15/2020   Breast cancer    Cognitive communication deficit    Dementia    Difficulty in walking(719.7)    Fall    HTN (hypertension) 08/26/2016   Muscle weakness (generalized)    Rash and nonspecific skin eruption    Thyroid disease    hypo   Urinary tract infection, site not specified    Vitamin B deficiency     Past Surgical History:  Procedure Laterality Date   COLON SURGERY     IR CATHETER TUBE CHANGE  05/07/2017   IR EXCHANGE BILIARY DRAIN  07/02/2017   IR EXCHANGE BILIARY DRAIN  07/10/2017   IR EXCHANGE BILIARY DRAIN  08/28/2017   IR EXCHANGE BILIARY DRAIN  10/11/2017   IR EXCHANGE BILIARY DRAIN  10/28/2017   IR EXCHANGE BILIARY DRAIN  12/23/2017   IR EXCHANGE BILIARY DRAIN  01/23/2018   IR EXCHANGE BILIARY DRAIN  02/17/2018   IR EXCHANGE BILIARY DRAIN  03/20/2018   IR EXCHANGE BILIARY DRAIN  04/17/2018   IR EXCHANGE BILIARY DRAIN  05/29/2018   IR EXCHANGE BILIARY DRAIN  07/24/2018   IR EXCHANGE BILIARY DRAIN  09/22/2018   IR EXCHANGE BILIARY DRAIN  11/20/2018   IR EXCHANGE BILIARY DRAIN  02/11/2019   IR EXCHANGE BILIARY DRAIN  04/14/2019   IR EXCHANGE BILIARY DRAIN  06/09/2019   IR EXCHANGE BILIARY DRAIN  11/26/2019   IR EXCHANGE BILIARY DRAIN  12/23/2019   IR EXCHANGE BILIARY DRAIN  02/22/2020   IR EXCHANGE BILIARY DRAIN  05/25/2020    IR EXCHANGE BILIARY DRAIN  07/29/2020   IR EXCHANGE BILIARY DRAIN  08/30/2020   IR EXCHANGE BILIARY DRAIN  10/06/2020   IR EXCHANGE BILIARY DRAIN  11/18/2020   IR EXCHANGE BILIARY DRAIN  12/21/2020   IR EXCHANGE BILIARY DRAIN  02/03/2021   IR EXCHANGE BILIARY DRAIN  03/07/2021   IR EXCHANGE BILIARY DRAIN  05/04/2021   IR EXCHANGE BILIARY DRAIN  06/22/2021   IR EXCHANGE BILIARY DRAIN  07/04/2021   IR EXCHANGE BILIARY DRAIN  08/10/2021   IR PERC CHOLECYSTOSTOMY  01/27/2017   IR PERC CHOLECYSTOSTOMY  11/18/2019   IR RADIOLOGIST EVAL & MGMT  03/05/2017   IR RADIOLOGIST EVAL & MGMT  01/23/2023   IR SINUS/FIST TUBE CHK-NON GI  10/06/2020   MASTECTOMY, PARTIAL Right     Social History   Socioeconomic History   Marital status: Widowed    Spouse name: Not on file   Number of children: Not on file   Years of education: Not on file   Highest education level: Not on file  Occupational History   Occupation: retired  Tobacco Use   Smoking status: Never   Smokeless tobacco: Never  Vaping Use   Vaping Use: Never used  Substance and Sexual Activity  Alcohol use: No   Drug use: No   Sexual activity: Not Currently  Other Topics Concern   Not on file  Social History Narrative   Long term resident of [facility]    Social Determinants of Health   Financial Resource Strain: Low Risk  (07/21/2018)   Overall Financial Resource Strain (CARDIA)    Difficulty of Paying Living Expenses: Not hard at all  Food Insecurity: No Food Insecurity (07/21/2018)   Hunger Vital Sign    Worried About Running Out of Food in the Last Year: Never true    Ran Out of Food in the Last Year: Never true  Transportation Needs: No Transportation Needs (07/21/2018)   PRAPARE - Administrator, Civil Service (Medical): No    Lack of Transportation (Non-Medical): No  Physical Activity: Inactive (07/21/2018)   Exercise Vital Sign    Days of Exercise per Week: 0 days    Minutes of Exercise per Session: 0 min  Stress: No Stress  Concern Present (07/21/2018)   [employer] of Occupational Health - Occupational Stress Questionnaire    Feeling of Stress : Not at all  Social Connections: Socially Isolated (07/21/2018)   Social Connection and Isolation Panel [NHANES]    Frequency of Communication with Friends and Family: Never    Frequency of Social Gatherings with Friends and Family: Never    Attends Religious Services: Never    Database administrator or Organizations: No    Attends Banker Meetings: Never    Marital Status: Widowed  Intimate Partner Violence: Not At Risk (07/21/2018)   Humiliation, Afraid, Rape, and Kick questionnaire    Fear of Current or Ex-Partner: No    Emotionally Abused: No    Physically Abused: No    Sexually Abused: No   Family History  Problem Relation Age of Onset   Cancer Father        ? primary   Diabetes Neg Hx    Heart disease Neg Hx    Stroke Neg Hx       VITAL SIGNS BP (!) 122/57   Pulse 63   Temp (!) 97 F (36.1 C)   Resp 18   Ht 5\' 3"  (1.6 m)   Wt 127 lb 3.2 oz (57.7 kg)   SpO2 99%   BMI 22.53 kg/m   Outpatient Encounter Medications as of 02/12/2023  Medication Sig   Balsam [place]-Castor Oil (VENELEX) OINT Apply topically. Apply to bilateral buttocks qshift for erythema.   donepezil (ARICEPT) 10 MG tablet Take 1 tablet by mouth at bedtime.   No facility-administered encounter medications on file as of 02/12/2023.     SIGNIFICANT DIAGNOSTIC EXAMS  DIAGNOSTIC EXAMS  PREVIOUS   06-06-22: dexa scan: t score -2.464    LABS REVIEWED: PREVIOUS:    07-30-22: wbc 4.9; hgb 11.9; hct 37.4; mcv 97.9 plt 262; glucose 77; bun 16; creat 0.81; k+ 4.1; na++ 142; ca 8.5 gfr >60; protein 6.5 albumin 3.0; vitamin B 12: 636; vitamin D 51.61  NO NEW LABS.    Review of Systems  Constitutional:  Negative for malaise/fatigue.  Respiratory:  Negative for cough and shortness of breath.   Cardiovascular:  Negative for chest pain, palpitations and leg swelling.   Gastrointestinal:  Negative for abdominal pain, constipation and heartburn.  Musculoskeletal:  Negative for back pain, joint pain and myalgias.  Skin: Negative.   Neurological:  Negative for dizziness.  Psychiatric/Behavioral:  The patient is not nervous/anxious.    Physical  Exam Constitutional:      General: She is not in acute distress.    Appearance: She is well-developed. She is not diaphoretic.  Neck:     Thyroid: No thyromegaly.  Cardiovascular:     Rate and Rhythm: Normal rate and regular rhythm.     Pulses: Normal pulses.     Heart sounds: Normal heart sounds.  Pulmonary:     Effort: Pulmonary effort is normal. No respiratory distress.     Breath sounds: Normal breath sounds.  Chest:     Comments: Right partial mastectomy Abdominal:     General: Bowel sounds are normal. There is no distension.     Palpations: Abdomen is soft.     Tenderness: There is no abdominal tenderness.  Musculoskeletal:        General: Normal range of motion.     Cervical back: Neck supple.     Right lower leg: No edema.     Left lower leg: No edema.  Lymphadenopathy:     Cervical: No cervical adenopathy.  Skin:    General: Skin is warm and dry.  Neurological:     Mental Status: She is alert. Mental status is at baseline.  Psychiatric:        Mood and Affect: Mood normal.        ASSESSMENT/ PLAN:  TODAY  Herpes: without outbreak  2. Protein calorie malnutrition: albumin 3.0 will continue supplements as directed  3. Chronic anemia: hgb 11.9    PREVIOUS   4. Aortic atherosclerosis (ct 08-26-19); is not on statin due to advanced age  46. Major neurocognitive disorder/vascular dementia without behavioral disturbance: weight is 127 pounds; weight remains stable; will continue aricept 10 mg daily   6. Primary hypertension: b/p 122/57    7. Calculus of gall bladder without acuate cholecystitis and obstruction: her chole drain is out at this time  8. Chronic constipation: will  continue miralax daily as needed  9. Post menopausal osteoporosis: t score -2.464 will monitor   Will check cbc; cmp   Synthia Innocent NP [facility]   call [phone]

## 2023-02-18 ENCOUNTER — Other Ambulatory Visit (HOSPITAL_COMMUNITY)
Admission: RE | Admit: 2023-02-18 | Discharge: 2023-02-18 | Disposition: A | Discharge status: Home / Self Care | Payer: Medicare Other | Source: Skilled Nursing Facility | Attending: Adult Health | Admitting: Adult Health

## 2023-02-18 DIAGNOSIS — I10 Essential (primary) hypertension: Secondary | ICD-10-CM | POA: Diagnosis not present

## 2023-02-18 LAB — CBC
HCT: 34.8 % — ABNORMAL LOW (ref 36.0–46.0)
Hemoglobin: 11.2 g/dL — ABNORMAL LOW (ref 12.0–15.0)
MCH: 31 pg (ref 26.0–34.0)
MCHC: 32.2 g/dL (ref 30.0–36.0)
MCV: 96.4 fL (ref 80.0–100.0)
Platelets: 234 10*3/uL (ref 150–400)
RBC: 3.61 MIL/uL — ABNORMAL LOW (ref 3.87–5.11)
RDW: 16.6 % — ABNORMAL HIGH (ref 11.5–15.5)
WBC: 6.5 10*3/uL (ref 4.0–10.5)
nRBC: 0 % (ref 0.0–0.2)

## 2023-02-18 LAB — COMPREHENSIVE METABOLIC PANEL
ALT: 10 U/L (ref 0–44)
AST: 15 U/L (ref 15–41)
Albumin: 2.9 g/dL — ABNORMAL LOW (ref 3.5–5.0)
Alkaline Phosphatase: 56 U/L (ref 38–126)
Anion gap: 8 (ref 5–15)
BUN: 19 mg/dL (ref 8–23)
CO2: 25 mmol/L (ref 22–32)
Calcium: 8.4 mg/dL — ABNORMAL LOW (ref 8.9–10.3)
Chloride: 107 mmol/L (ref 98–111)
Creatinine, Ser: 0.81 mg/dL (ref 0.44–1.00)
GFR, Estimated: >60 mL/min (ref >60)
Glucose, Bld: 90 mg/dL (ref 70–99)
Potassium: 3.8 mmol/L (ref 3.5–5.1)
Sodium: 140 mmol/L (ref 135–145)
Total Bilirubin: 0.6 mg/dL (ref 0.3–1.2)
Total Protein: 6.8 g/dL (ref 6.5–8.1)

## 2023-03-07 ENCOUNTER — Encounter: Payer: Self-pay | Admitting: Internal Medicine

## 2023-03-07 ENCOUNTER — Non-Acute Institutional Stay (SKILLED_NURSING_FACILITY): Payer: Medicare Other | Admitting: Internal Medicine

## 2023-03-07 DIAGNOSIS — E441 Mild protein-calorie malnutrition: Secondary | ICD-10-CM | POA: Diagnosis not present

## 2023-03-07 DIAGNOSIS — D649 Anemia, unspecified: Secondary | ICD-10-CM | POA: Diagnosis not present

## 2023-03-07 DIAGNOSIS — I700 Atherosclerosis of aorta: Secondary | ICD-10-CM | POA: Diagnosis not present

## 2023-03-07 DIAGNOSIS — K8061 Calculus of gallbladder and bile duct with cholecystitis, unspecified, with obstruction: Secondary | ICD-10-CM | POA: Diagnosis not present

## 2023-03-07 NOTE — Assessment & Plan Note (Signed)
She is asymptomatic.  LFTs are within normal limits.

## 2023-03-07 NOTE — Patient Instructions (Signed)
See assessment and plan under each diagnosis in the problem list and acutely for this visit 

## 2023-03-07 NOTE — Assessment & Plan Note (Signed)
02/18/2023 slight progression of anemia with H/H of 11.2/34.8, down from prior values 11.9/37.4.  No bleeding dyscrasias reported at the SNF.  Continue to monitor.

## 2023-03-07 NOTE — Progress Notes (Signed)
NURSING HOME LOCATION:  [facility] ROOM NUMBER:  [id number] D  CODE STATUS:  DNR  PCP:  Synthia Innocent NP  This is a nursing facility follow up visit of chronic medical diagnoses & to document compliance with Regulation 483.30 (c) in The Long Term Care Survey Manual Phase 2 which mandates caregiver visit ( visits can alternate among physician, PA or NP as per statutes) within 10 days of 30 days / 60 days/ 90 days post admission to SNF date    Interim medical record and care since last SNF visit was updated with review of diagnostic studies and change in clinical status since last visit were documented.  HPI: She is a permanent resident of this facility with medical diagnoses of aortic atherosclerosis, history of breast cancer, essential hypertension, history of thyroid disease, history of calculus cholecystitis, and dementia. Surgeries and procedures include multiple biliary drainage procedures as well as percutaneous cholecystostomy tube placement. The tube was removed in 2022.  She has also had a partial mastectomy. Labs performed 02/18/2023 revealed calcium of 8.4 and albumin of 2.9, basically stable to minimally lower.  Total protein was 6.8.  There has been slight progression of anemia with H/H of 11.2/34.8; prior values were 11.9/37.4. 01/23/2023 CT of the abdomen and pelvis revealed persistent cholelithiasis without cholecystitis.  Right renal atrophy was also present.  There was evidence of previous right hemicolectomy and diverticulosis without diverticulitis. LFTs performed 4/29 were normal.  Review of systems: Dementia invalidated responses.  She denies any active symptoms with special reference on any symptoms related to her gallbladder history.  She confabulated and repeated the same questions..  Even though she has met me on multiple occasions she asked my name and repeatedly asked me "where are you from?"  She had no memory of having had a CT scan of the abdomen and  pelvis and stated that she did not know the results because "I do not hold it."  She repeatedly asked if I wanted to sing as a response to various queries.  Constitutional: No fever, significant weight change, fatigue  Eyes: No redness, discharge, pain, vision change ENT/mouth: No nasal congestion,  purulent discharge, earache, change in hearing, sore throat  Cardiovascular: No chest pain, palpitations, paroxysmal nocturnal dyspnea, claudication, edema  Respiratory: No cough, sputum production, hemoptysis, DOE, significant snoring, apnea   Gastrointestinal: No heartburn, dysphagia, abdominal pain, nausea /vomiting, rectal bleeding, melena, change in bowels Genitourinary: No dysuria, hematuria, pyuria, incontinence, nocturia Musculoskeletal: No joint stiffness, joint swelling, weakness, pain Dermatologic: No rash, pruritus, change in appearance of skin Neurologic: No dizziness, headache, syncope, seizures, numbness, tingling Psychiatric: No significant anxiety, depression, insomnia, anorexia Endocrine: No change in hair/skin/nails, excessive thirst, excessive hunger, excessive urination  Hematologic/lymphatic: No significant bruising, lymphadenopathy, abnormal bleeding Allergy/immunology: No itchy/watery eyes, significant sneezing, urticaria, angioedema  Physical exam:  Pertinent or positive findings: She is thin and but appears adequately nourished.  Lower lids are puffy.  Her teeth are malaligned and coated.  Heart rate is slow but essentially regular.  Pedal pulses are decreased.  Limbs are thin.  She has interosseous wasting.  General appearance: no acute distress, increased work of breathing is present.   Lymphatic: No lymphadenopathy about the head, neck, axilla. Eyes: No conjunctival inflammation or lid edema is present. There is no scleral icterus. Ears:  External ear exam shows no significant lesions or deformities.   Nose:  External nasal examination shows no deformity or  inflammation. Nasal mucosa are pink and moist  without lesions, exudates Neck:  No thyromegaly, masses, tenderness noted.    Heart:  No gallop, murmur, click, rub .  Lungs: Chest clear to auscultation without wheezes, rhonchi, rales, rubs. Abdomen: Bowel sounds are normal. Abdomen is soft and nontender with no organomegaly, hernias, masses. GU: Deferred  Extremities:  No cyanosis, clubbing, edema  Neurologic exam :Balance, Rhomberg, finger to nose testing could not be completed due to clinical state Skin: Warm & dry w/o tenting. No significant lesions or rash.  See summary under each active problem in the Problem List with associated updated therapeutic plan

## 2023-03-07 NOTE — Assessment & Plan Note (Signed)
No anginal equivalent described.

## 2023-03-07 NOTE — Assessment & Plan Note (Addendum)
02/18/2023 current albumin 2.9 down from prior value of 3.0.  Total protein is normal at 6.8. Limb atrophy present. Nutritionist to monitor.

## 2023-04-22 ENCOUNTER — Non-Acute Institutional Stay (SKILLED_NURSING_FACILITY): Payer: Medicare Other | Admitting: Adult Health

## 2023-04-22 ENCOUNTER — Encounter: Payer: Self-pay | Admitting: Adult Health

## 2023-04-22 DIAGNOSIS — F0150 Vascular dementia without behavioral disturbance: Secondary | ICD-10-CM

## 2023-04-22 DIAGNOSIS — F0390 Unspecified dementia without behavioral disturbance: Secondary | ICD-10-CM

## 2023-04-22 DIAGNOSIS — I10 Essential (primary) hypertension: Secondary | ICD-10-CM | POA: Diagnosis not present

## 2023-04-22 DIAGNOSIS — I700 Atherosclerosis of aorta: Secondary | ICD-10-CM

## 2023-04-22 NOTE — Progress Notes (Signed)
Location:  [facility] Room Number: 141 Place of Service:  SNF (31)   CODE STATUS: dnr  No Known Allergies  Chief Complaint  Patient presents with   Medical Management of Chronic Issues           Aortic atherosclerosis   Major neurocognitive disorder/vascular dementia without behavioral disturbance:  Primary hypertension     HPI:  She is a 89 year old long term resident of this facility being seen for the management of her chronic illnesses: Aortic atherosclerosis   Major neurocognitive disorder/vascular dementia without behavioral disturbance:  Primary hypertension. There are no reports of uncontrolled pain. There are no reports of agitation of reports of anxiety.   Past Medical History:  Diagnosis Date   Aortic atherosclerosis (HCC) 11/15/2020   Breast cancer (HCC)    Cognitive communication deficit    Dementia (HCC)    Difficulty in walking(719.7)    Fall    HTN (hypertension) 08/26/2016   Muscle weakness (generalized)    Rash and nonspecific skin eruption    Thyroid disease    hypo   Urinary tract infection, site not specified    Vitamin B deficiency     Past Surgical History:  Procedure Laterality Date   COLON SURGERY     IR CATHETER TUBE CHANGE  05/07/2017   IR EXCHANGE BILIARY DRAIN  07/02/2017   IR EXCHANGE BILIARY DRAIN  07/10/2017   IR EXCHANGE BILIARY DRAIN  08/28/2017   IR EXCHANGE BILIARY DRAIN  10/11/2017   IR EXCHANGE BILIARY DRAIN  10/28/2017   IR EXCHANGE BILIARY DRAIN  12/23/2017   IR EXCHANGE BILIARY DRAIN  01/23/2018   IR EXCHANGE BILIARY DRAIN  02/17/2018   IR EXCHANGE BILIARY DRAIN  03/20/2018   IR EXCHANGE BILIARY DRAIN  04/17/2018   IR EXCHANGE BILIARY DRAIN  05/29/2018   IR EXCHANGE BILIARY DRAIN  07/24/2018   IR EXCHANGE BILIARY DRAIN  09/22/2018   IR EXCHANGE BILIARY DRAIN  11/20/2018   IR EXCHANGE BILIARY DRAIN  02/11/2019   IR EXCHANGE BILIARY DRAIN  04/14/2019   IR EXCHANGE BILIARY DRAIN  06/09/2019   IR EXCHANGE BILIARY DRAIN   11/26/2019   IR EXCHANGE BILIARY DRAIN  12/23/2019   IR EXCHANGE BILIARY DRAIN  02/22/2020   IR EXCHANGE BILIARY DRAIN  05/25/2020   IR EXCHANGE BILIARY DRAIN  07/29/2020   IR EXCHANGE BILIARY DRAIN  08/30/2020   IR EXCHANGE BILIARY DRAIN  10/06/2020   IR EXCHANGE BILIARY DRAIN  11/18/2020   IR EXCHANGE BILIARY DRAIN  12/21/2020   IR EXCHANGE BILIARY DRAIN  02/03/2021   IR EXCHANGE BILIARY DRAIN  03/07/2021   IR EXCHANGE BILIARY DRAIN  05/04/2021   IR EXCHANGE BILIARY DRAIN  06/22/2021   IR EXCHANGE BILIARY DRAIN  07/04/2021   IR EXCHANGE BILIARY DRAIN  08/10/2021   IR PERC CHOLECYSTOSTOMY  01/27/2017   IR PERC CHOLECYSTOSTOMY  11/18/2019   IR RADIOLOGIST EVAL & MGMT  03/05/2017   IR RADIOLOGIST EVAL & MGMT  01/23/2023   IR SINUS/FIST TUBE CHK-NON GI  10/06/2020   MASTECTOMY, PARTIAL Right     Social History   Socioeconomic History   Marital status: Widowed    Spouse name: Not on file   Number of children: Not on file   Years of education: Not on file   Highest education level: Not on file  Occupational History   Occupation: retired  Tobacco Use   Smoking status: Never   Smokeless tobacco: Never  Vaping Use   Vaping Use: Never used  Substance and Sexual Activity   Alcohol use: No   Drug use: No   Sexual activity: Not Currently  Other Topics Concern   Not on file  Social History Narrative   Long term resident of [facility]    Social Determinants of Health   Financial Resource Strain: Low Risk  (07/21/2018)   Overall Financial Resource Strain (CARDIA)    Difficulty of Paying Living Expenses: Not hard at all  Food Insecurity: No Food Insecurity (07/21/2018)   Hunger Vital Sign    Worried About Running Out of Food in the Last Year: Never true    Ran Out of Food in the Last Year: Never true  Transportation Needs: No Transportation Needs (07/21/2018)   PRAPARE - Administrator, Civil Service (Medical): No    Lack of Transportation (Non-Medical): No  Physical Activity: Inactive  (07/21/2018)   Exercise Vital Sign    Days of Exercise per Week: 0 days    Minutes of Exercise per Session: 0 min  Stress: No Stress Concern Present (07/21/2018)   [employer] of Occupational Health - Occupational Stress Questionnaire    Feeling of Stress : Not at all  Social Connections: Socially Isolated (07/21/2018)   Social Connection and Isolation Panel [NHANES]    Frequency of Communication with Friends and Family: Never    Frequency of Social Gatherings with Friends and Family: Never    Attends Religious Services: Never    Database administrator or Organizations: No    Attends Banker Meetings: Never    Marital Status: Widowed  Intimate Partner Violence: Not At Risk (07/21/2018)   Humiliation, Afraid, Rape, and Kick questionnaire    Fear of Current or Ex-Partner: No    Emotionally Abused: No    Physically Abused: No    Sexually Abused: No   Family History  Problem Relation Age of Onset   Cancer Father        ? primary   Diabetes Neg Hx    Heart disease Neg Hx    Stroke Neg Hx       VITAL SIGNS BP 129/81   Pulse 69   Temp 97.9 F (36.6 C)   Resp 20   Ht 5\' 3"  (1.6 m)   Wt 128 lb 9.6 oz (58.3 kg)   SpO2 98%   BMI 22.78 kg/m   Outpatient Encounter Medications as of 04/22/2023  Medication Sig   Balsam [place]-Castor Oil (VENELEX) OINT Apply topically. Apply to bilateral buttocks qshift for erythema.   donepezil (ARICEPT) 10 MG tablet Take 1 tablet by mouth at bedtime.   No facility-administered encounter medications on file as of 04/22/2023.     SIGNIFICANT DIAGNOSTIC EXAMS  DIAGNOSTIC EXAMS  PREVIOUS   06-06-22: dexa scan: t score -2.464    LABS REVIEWED: PREVIOUS:    07-30-22: wbc 4.9; hgb 11.9; hct 37.4; mcv 97.9 plt 262; glucose 77; bun 16; creat 0.81; k+ 4.1; na++ 142; ca 8.5 gfr >60; protein 6.5 albumin 3.0; vitamin B 12: 636; vitamin D 51.61 02-18-23: wbc 6.5; hgb 11.2; hct 34.8; mcv 96.4 plt 234; glucose 90; bun 19; creat 0.81; k+ 3.8;  na++ 140; ca 8.4 gfr >60; protein 6.8 albumin 2.9     NO NEW LABS.   Review of Systems  Constitutional:  Negative for malaise/fatigue.  Respiratory:  Negative for cough and shortness of breath.   Cardiovascular:  Negative for chest pain, palpitations and leg  swelling.  Gastrointestinal:  Negative for abdominal pain, constipation and heartburn.  Musculoskeletal:  Negative for back pain, joint pain and myalgias.  Skin: Negative.   Neurological:  Negative for dizziness.  Psychiatric/Behavioral:  The patient is not nervous/anxious.    Physical Exam Constitutional:      General: She is not in acute distress.    Appearance: She is well-developed. She is not diaphoretic.  Neck:     Thyroid: No thyromegaly.  Cardiovascular:     Rate and Rhythm: Normal rate and regular rhythm.     Pulses: Normal pulses.     Heart sounds: Normal heart sounds.  Pulmonary:     Effort: Pulmonary effort is normal. No respiratory distress.     Breath sounds: Normal breath sounds.  Chest:     Comments:  Right partial mastectomy Abdominal:     General: Bowel sounds are normal. There is no distension.     Palpations: Abdomen is soft.     Tenderness: There is no abdominal tenderness.  Musculoskeletal:        General: Normal range of motion.     Cervical back: Neck supple.     Right lower leg: No edema.     Left lower leg: No edema.  Lymphadenopathy:     Cervical: No cervical adenopathy.  Skin:    General: Skin is warm and dry.  Neurological:     Mental Status: She is alert. Mental status is at baseline.  Psychiatric:        Mood and Affect: Mood normal.     ASSESSMENT/ PLAN:  TODAY  Aortic atherosclerosis (ct 08-26-19) not on statin due to advanced age  54. Major neurocognitive disorder/vascular dementia without behavioral disturbance: weight is 128 pounds; will continue aricept 10 mg daily   3. Primary hypertension: b/p: 129/81   PREVIOUS   4. Calculus of gall bladder without acuate  cholecystitis and obstruction: her chole drain is out at this time  5. Chronic constipation: will continue miralax daily as needed  6. Post menopausal osteoporosis: t score -2.464 will monitor   7. Herpes: without outbreak  8. Protein calorie malnutrition: albumin 3.0 will continue supplements as directed  9. Chronic anemia: hgb 11.9     Synthia Innocent NP [facility] Adult [facility]  call [phone]

## 2023-05-10 ENCOUNTER — Non-Acute Institutional Stay (SKILLED_NURSING_FACILITY): Payer: Medicare Other | Admitting: Adult Health

## 2023-05-10 ENCOUNTER — Encounter: Payer: Self-pay | Admitting: Adult Health

## 2023-05-10 DIAGNOSIS — I700 Atherosclerosis of aorta: Secondary | ICD-10-CM

## 2023-05-10 DIAGNOSIS — F0390 Unspecified dementia without behavioral disturbance: Secondary | ICD-10-CM

## 2023-05-10 DIAGNOSIS — I10 Essential (primary) hypertension: Secondary | ICD-10-CM | POA: Diagnosis not present

## 2023-05-10 NOTE — Progress Notes (Signed)
Location:  [facility] Room Number: 141 Place of Service:  SNF (31)   CODE STATUS: dnr   No Known Allergies  Chief Complaint  Patient presents with   Acute Visit    Care plan meeting.     HPI:  We have come together for her care plan meeting. Family present. BIMS none; mood: 0/30. Oriented to person only. Does ambulate with a walker with no falls. She requires independent assist with her adls. She is frequently incontinent of bladder and bowel. Dietary: D3 thin liquid diet set up for meals; appetite 1-100%. Therapy: none at this time. Activities: socializing pets; food, parties. She continues to be followed for her chronic illnesses including:  Aortic atherosclerosis   Essential hypertension   Major neurocognitive disorder  Past Medical History:  Diagnosis Date   Aortic atherosclerosis (HCC) 11/15/2020   Breast cancer (HCC)    Cognitive communication deficit    Dementia (HCC)    Difficulty in walking(719.7)    Fall    HTN (hypertension) 08/26/2016   Muscle weakness (generalized)    Rash and nonspecific skin eruption    Thyroid disease    hypo   Urinary tract infection, site not specified    Vitamin B deficiency     Past Surgical History:  Procedure Laterality Date   COLON SURGERY     IR CATHETER TUBE CHANGE  05/07/2017   IR EXCHANGE BILIARY DRAIN  07/02/2017   IR EXCHANGE BILIARY DRAIN  07/10/2017   IR EXCHANGE BILIARY DRAIN  08/28/2017   IR EXCHANGE BILIARY DRAIN  10/11/2017   IR EXCHANGE BILIARY DRAIN  10/28/2017   IR EXCHANGE BILIARY DRAIN  12/23/2017   IR EXCHANGE BILIARY DRAIN  01/23/2018   IR EXCHANGE BILIARY DRAIN  02/17/2018   IR EXCHANGE BILIARY DRAIN  03/20/2018   IR EXCHANGE BILIARY DRAIN  04/17/2018   IR EXCHANGE BILIARY DRAIN  05/29/2018   IR EXCHANGE BILIARY DRAIN  07/24/2018   IR EXCHANGE BILIARY DRAIN  09/22/2018   IR EXCHANGE BILIARY DRAIN  11/20/2018   IR EXCHANGE BILIARY DRAIN  02/11/2019   IR EXCHANGE BILIARY DRAIN  04/14/2019   IR  EXCHANGE BILIARY DRAIN  06/09/2019   IR EXCHANGE BILIARY DRAIN  11/26/2019   IR EXCHANGE BILIARY DRAIN  12/23/2019   IR EXCHANGE BILIARY DRAIN  02/22/2020   IR EXCHANGE BILIARY DRAIN  05/25/2020   IR EXCHANGE BILIARY DRAIN  07/29/2020   IR EXCHANGE BILIARY DRAIN  08/30/2020   IR EXCHANGE BILIARY DRAIN  10/06/2020   IR EXCHANGE BILIARY DRAIN  11/18/2020   IR EXCHANGE BILIARY DRAIN  12/21/2020   IR EXCHANGE BILIARY DRAIN  02/03/2021   IR EXCHANGE BILIARY DRAIN  03/07/2021   IR EXCHANGE BILIARY DRAIN  05/04/2021   IR EXCHANGE BILIARY DRAIN  06/22/2021   IR EXCHANGE BILIARY DRAIN  07/04/2021   IR EXCHANGE BILIARY DRAIN  08/10/2021   IR PERC CHOLECYSTOSTOMY  01/27/2017   IR PERC CHOLECYSTOSTOMY  11/18/2019   IR RADIOLOGIST EVAL & MGMT  03/05/2017   IR RADIOLOGIST EVAL & MGMT  01/23/2023   IR SINUS/FIST TUBE CHK-NON GI  10/06/2020   MASTECTOMY, PARTIAL Right     Social History   Socioeconomic History   Marital status: Widowed    Spouse name: Not on file   Number of children: Not on file   Years of education: Not on file   Highest education level: Not on file  Occupational History   Occupation: retired  Tobacco  Use   Smoking status: Never   Smokeless tobacco: Never  Vaping Use   Vaping status: Never Used  Substance and Sexual Activity   Alcohol use: No   Drug use: No   Sexual activity: Not Currently  Other Topics Concern   Not on file  Social History Narrative   Long term resident of [facility]    Social Determinants of Health   Financial Resource Strain: Low Risk  (07/21/2018)   Overall Financial Resource Strain (CARDIA)    Difficulty of Paying Living Expenses: Not hard at all  Food Insecurity: No Food Insecurity (07/21/2018)   Hunger Vital Sign    Worried About Running Out of Food in the Last Year: Never true    Ran Out of Food in the Last Year: Never true  Transportation Needs: No Transportation Needs (07/21/2018)   PRAPARE - Administrator, Civil Service (Medical): No    Lack of  Transportation (Non-Medical): No  Physical Activity: Inactive (07/21/2018)   Exercise Vital Sign    Days of Exercise per Week: 0 days    Minutes of Exercise per Session: 0 min  Stress: No Stress Concern Present (07/21/2018)   [employer] of Occupational Health - Occupational Stress Questionnaire    Feeling of Stress : Not at all  Social Connections: Socially Isolated (07/21/2018)   Social Connection and Isolation Panel [NHANES]    Frequency of Communication with Friends and Family: Never    Frequency of Social Gatherings with Friends and Family: Never    Attends Religious Services: Never    Database administrator or Organizations: No    Attends Banker Meetings: Never    Marital Status: Widowed  Intimate Partner Violence: Not At Risk (07/21/2018)   Humiliation, Afraid, Rape, and Kick questionnaire    Fear of Current or Ex-Partner: No    Emotionally Abused: No    Physically Abused: No    Sexually Abused: No   Family History  Problem Relation Age of Onset   Cancer Father        ? primary   Diabetes Neg Hx    Heart disease Neg Hx    Stroke Neg Hx       VITAL SIGNS BP (!) 137/59   Pulse 70   Temp (!) 97.4 F (36.3 C)   Resp 20   SpO2 99%   Outpatient Encounter Medications as of 05/10/2023  Medication Sig   Balsam Peru-Castor Oil (VENELEX) OINT Apply topically. Apply to bilateral buttocks qshift for erythema.   donepezil (ARICEPT) 10 MG tablet Take 1 tablet by mouth at bedtime.   No facility-administered encounter medications on file as of 05/10/2023.     SIGNIFICANT DIAGNOSTIC EXAMS  DIAGNOSTIC EXAMS  PREVIOUS   06-06-22: dexa scan: t score -2.464    LABS REVIEWED: PREVIOUS:    07-30-22: wbc 4.9; hgb 11.9; hct 37.4; mcv 97.9 plt 262; glucose 77; bun 16; creat 0.81; k+ 4.1; na++ 142; ca 8.5 gfr >60; protein 6.5 albumin 3.0; vitamin B 12: 636; vitamin D 51.61 02-18-23: wbc 6.5; hgb 11.2; hct 34.8; mcv 96.4 plt 234; glucose 90; bun 19; creat 0.81; k+  3.8; na++ 140; ca 8.4 gfr >60; protein 6.8 albumin 2.9     NO NEW LABS.   Review of Systems  Constitutional:  Negative for malaise/fatigue.  Respiratory:  Negative for cough and shortness of breath.   Cardiovascular:  Negative for chest pain, palpitations and leg swelling.  Gastrointestinal:  Negative for abdominal  pain, constipation and heartburn.  Musculoskeletal:  Negative for back pain, joint pain and myalgias.  Skin: Negative.   Neurological:  Negative for dizziness.  Psychiatric/Behavioral:  The patient is not nervous/anxious.     Physical Exam Constitutional:      General: She is not in acute distress.    Appearance: She is well-developed. She is not diaphoretic.  Neck:     Thyroid: No thyromegaly.  Cardiovascular:     Rate and Rhythm: Normal rate and regular rhythm.     Pulses: Normal pulses.     Heart sounds: Normal heart sounds.  Pulmonary:     Effort: Pulmonary effort is normal. No respiratory distress.     Breath sounds: Normal breath sounds.  Abdominal:     General: Bowel sounds are normal. There is no distension.     Palpations: Abdomen is soft.     Tenderness: There is no abdominal tenderness.  Musculoskeletal:        General: Normal range of motion.     Cervical back: Neck supple.     Right lower leg: No edema.     Left lower leg: No edema.  Lymphadenopathy:     Cervical: No cervical adenopathy.  Skin:    General: Skin is warm and dry.  Neurological:     Mental Status: She is alert. Mental status is at baseline.  Psychiatric:        Mood and Affect: Mood normal.       ASSESSMENT/ PLAN:  TODAY  Aortic atherosclerosis Essential hypertension Major neurocognitive disorder  Will continue current medications Will continue current plan of care Will continue to monitor her status.   Time spent with patient: 35 minutes: medications; dietary; activities.    Synthia Innocent NP [facility]   call [phone]

## 2023-06-17 ENCOUNTER — Encounter: Payer: Self-pay | Admitting: Adult Health

## 2023-06-17 ENCOUNTER — Non-Acute Institutional Stay (INDEPENDENT_AMBULATORY_CARE_PROVIDER_SITE_OTHER): Payer: Medicare Other | Admitting: Adult Health

## 2023-06-17 DIAGNOSIS — Z0000 Encounter for general adult medical examination without abnormal findings: Secondary | ICD-10-CM | POA: Diagnosis not present

## 2023-06-17 NOTE — Progress Notes (Signed)
Subjective:   Cynthia [name] is a 89 y.o. female who presents for Medicare Annual (Subsequent) preventive examination.  Visit Complete: In person  Patient Medicare AWV questionnaire was completed by the patient on 06-17-23 ; I have confirmed that all information answered by patient is correct and no changes since this date.  Review of Systems    Review of Systems  Constitutional:  Negative for malaise/fatigue.  Respiratory:  Negative for cough and shortness of breath.   Cardiovascular:  Negative for chest pain, palpitations and leg swelling.  Gastrointestinal:  Negative for abdominal pain, constipation and heartburn.  Musculoskeletal:  Negative for back pain, joint pain and myalgias.  Skin: Negative.   Neurological:  Negative for dizziness.  Psychiatric/Behavioral:  The patient is not nervous/anxious.     Cardiac Risk Factors include: advanced age (>58men, >24 women);sedentary lifestyle     Objective:    Today's Vitals   06/17/23 1406  BP: 117/69  Pulse: (!) 58  Resp: 20  Temp: 98.6 F (37 C)  SpO2: 99%  Weight: 129 lb (58.5 kg)  Height: 5\' 3"  (1.6 m)   Body mass index is 22.85 kg/m.     02/12/2023    9:01 AM 01/23/2023    4:31 PM 11/19/2022    9:08 AM 10/11/2022   11:13 AM 08/10/2022    2:01 PM 08/09/2022   11:33 AM 07/26/2022   10:30 AM  Advanced Directives  Does Patient Have a Medical Advance Directive? Yes Yes Yes Yes Yes Yes Yes  Type of Advance Directive Out of facility DNR (pink MOST or yellow form) Out of facility DNR (pink MOST or yellow form) Out of facility DNR (pink MOST or yellow form) Out of facility DNR (pink MOST or yellow form)   Out of facility DNR (pink MOST or yellow form)  Does patient want to make changes to medical advance directive? No - Patient declined No - Patient declined No - Patient declined No - Patient declined No - Patient declined No - Patient declined   Pre-existing out of facility DNR order (yellow form or pink MOST form)   Pink  MOST form placed in chart (order not valid for inpatient use);Yellow form placed in chart (order not valid for inpatient use) Pink MOST/Yellow Form most recent copy in chart - Physician notified to receive inpatient order Pink MOST form placed in chart (order not valid for inpatient use);Yellow form placed in chart (order not valid for inpatient use) Pink MOST form placed in chart (order not valid for inpatient use);Yellow form placed in chart (order not valid for inpatient use) Yellow form placed in chart (order not valid for inpatient use);Pink Most/Yellow Form available - Physician notified to receive inpatient order    Current Medications (verified) Outpatient Encounter Medications as of 06/17/2023  Medication Sig   Balsam [place]-Castor Oil (VENELEX) OINT Apply topically. Apply to bilateral buttocks qshift for erythema.   donepezil (ARICEPT) 10 MG tablet Take 1 tablet by mouth at bedtime.   No facility-administered encounter medications on file as of 06/17/2023.    Allergies (verified) Patient has no known allergies.   History: Past Medical History:  Diagnosis Date   Aortic atherosclerosis (HCC) 11/15/2020   Breast cancer (HCC)    Cognitive communication deficit    Dementia (HCC)    Difficulty in walking(719.7)    Fall    HTN (hypertension) 08/26/2016   Muscle weakness (generalized)    Rash and nonspecific skin eruption    Thyroid disease  hypo   Urinary tract infection, site not specified    Vitamin B deficiency    Past Surgical History:  Procedure Laterality Date   COLON SURGERY     IR CATHETER TUBE CHANGE  05/07/2017   IR EXCHANGE BILIARY DRAIN  07/02/2017   IR EXCHANGE BILIARY DRAIN  07/10/2017   IR EXCHANGE BILIARY DRAIN  08/28/2017   IR EXCHANGE BILIARY DRAIN  10/11/2017   IR EXCHANGE BILIARY DRAIN  10/28/2017   IR EXCHANGE BILIARY DRAIN  12/23/2017   IR EXCHANGE BILIARY DRAIN  01/23/2018   IR EXCHANGE BILIARY DRAIN  02/17/2018   IR EXCHANGE BILIARY DRAIN  03/20/2018   IR  EXCHANGE BILIARY DRAIN  04/17/2018   IR EXCHANGE BILIARY DRAIN  05/29/2018   IR EXCHANGE BILIARY DRAIN  07/24/2018   IR EXCHANGE BILIARY DRAIN  09/22/2018   IR EXCHANGE BILIARY DRAIN  11/20/2018   IR EXCHANGE BILIARY DRAIN  02/11/2019   IR EXCHANGE BILIARY DRAIN  04/14/2019   IR EXCHANGE BILIARY DRAIN  06/09/2019   IR EXCHANGE BILIARY DRAIN  11/26/2019   IR EXCHANGE BILIARY DRAIN  12/23/2019   IR EXCHANGE BILIARY DRAIN  02/22/2020   IR EXCHANGE BILIARY DRAIN  05/25/2020   IR EXCHANGE BILIARY DRAIN  07/29/2020   IR EXCHANGE BILIARY DRAIN  08/30/2020   IR EXCHANGE BILIARY DRAIN  10/06/2020   IR EXCHANGE BILIARY DRAIN  11/18/2020   IR EXCHANGE BILIARY DRAIN  12/21/2020   IR EXCHANGE BILIARY DRAIN  02/03/2021   IR EXCHANGE BILIARY DRAIN  03/07/2021   IR EXCHANGE BILIARY DRAIN  05/04/2021   IR EXCHANGE BILIARY DRAIN  06/22/2021   IR EXCHANGE BILIARY DRAIN  07/04/2021   IR EXCHANGE BILIARY DRAIN  08/10/2021   IR PERC CHOLECYSTOSTOMY  01/27/2017   IR PERC CHOLECYSTOSTOMY  11/18/2019   IR RADIOLOGIST EVAL & MGMT  03/05/2017   IR RADIOLOGIST EVAL & MGMT  01/23/2023   IR SINUS/FIST TUBE CHK-NON GI  10/06/2020   MASTECTOMY, PARTIAL Right    Family History  Problem Relation Age of Onset   Cancer Father        ? primary   Diabetes Neg Hx    Heart disease Neg Hx    Stroke Neg Hx    Social History   Socioeconomic History   Marital status: Widowed    Spouse name: Not on file   Number of children: Not on file   Years of education: Not on file   Highest education level: Not on file  Occupational History   Occupation: retired  Tobacco Use   Smoking status: Never   Smokeless tobacco: Never  Vaping Use   Vaping status: Never Used  Substance and Sexual Activity   Alcohol use: No   Drug use: No   Sexual activity: Not Currently  Other Topics Concern   Not on file  Social History Narrative   Long term resident of [facility]    Social Determinants of Health   Financial Resource Strain: Low Risk  (07/21/2018)   Overall  Financial Resource Strain (CARDIA)    Difficulty of Paying Living Expenses: Not hard at all  Food Insecurity: No Food Insecurity (07/21/2018)   Hunger Vital Sign    Worried About Running Out of Food in the Last Year: Never true    Ran Out of Food in the Last Year: Never true  Transportation Needs: No Transportation Needs (07/21/2018)   PRAPARE - Administrator, Civil Service (Medical): No    Lack  of Transportation (Non-Medical): No  Physical Activity: Inactive (07/21/2018)   Exercise Vital Sign    Days of Exercise per Week: 0 days    Minutes of Exercise per Session: 0 min  Stress: No Stress Concern Present (07/21/2018)   [employer] of Occupational Health - Occupational Stress Questionnaire    Feeling of Stress : Not at all  Social Connections: Socially Isolated (07/21/2018)   Social Connection and Isolation Panel [NHANES]    Frequency of Communication with Friends and Family: Never    Frequency of Social Gatherings with Friends and Family: Never    Attends Religious Services: Never    Database administrator or Organizations: No    Attends Banker Meetings: Never    Marital Status: Widowed    Tobacco Counseling Counseling given: Not Answered   Clinical Intake:  Pre-visit preparation completed: Yes  Pain : No/denies pain     BMI - recorded: 22.85 Nutritional Status: BMI of 19-24  Normal Nutritional Risks: Unintentional weight loss Diabetes: No  How often do you need to have someone help you when you read instructions, pamphlets, or other written materials from your doctor or pharmacy?: 5 - Always  Interpreter Needed?: No      Activities of Daily Living    06/17/2023    2:33 PM  In your present state of health, do you have any difficulty performing the following activities:  Hearing? 0  Vision? 0  Difficulty concentrating or making decisions? 1  Walking or climbing stairs? 1  Dressing or bathing? 1  Doing errands, shopping? 1   Preparing Food and eating ? Y  Using the Toilet? Y  In the past six months, have you accidently leaked urine? Y  Do you have problems with loss of bowel control? Y  Managing your Medications? Y  Managing your Finances? Y  Housekeeping or managing your Housekeeping? Y    Patient Care Team: Sharee Holster, NP as PCP - General (Geriatric Medicine) [facility]  Indicate any recent Medical Services you may have received from other than [facility] providers in the past year (date may be approximate).     Assessment:   This is a routine wellness examination for Dustin.  Hearing/Vision screen No results found.  Dietary issues and exercise activities discussed:     Goals Addressed             This Visit's Progress    DIET - INCREASE WATER INTAKE   On track    Follow up with Provider as scheduled   On track    General - Client will not be readmitted within 30 days (C-SNP)   On track      Depression Screen    06/17/2023    2:34 PM 02/15/2023    3:31 PM 02/15/2023    3:30 PM 11/21/2022   11:23 AM 06/13/2022   12:41 PM 11/10/2021    2:05 PM 10/30/2021    3:30 PM  PHQ 2/9 Scores  PHQ - 2 Score 0 0 0 0 0 0 0  PHQ- 9 Score  0  0  0     Fall Risk    06/17/2023    2:34 PM 02/15/2023    3:30 PM 11/21/2022   11:23 AM 08/10/2022    2:01 PM 06/13/2022   12:41 PM  Fall Risk   Falls in the past year? 0 0 0 0 0  Number falls in past yr: 0 0 0 0 1  Injury with Fall? 0 0 0 0 0  Risk for fall due to : Impaired balance/gait;Impaired mobility Impaired balance/gait;Impaired mobility Impaired balance/gait;Impaired mobility History of fall(s);Impaired balance/gait Impaired balance/gait;Impaired mobility  Follow up  Falls evaluation completed  Falls evaluation completed     MEDICARE RISK AT HOME: Medicare Risk at Home Any stairs in or around the home?: Yes If so, are there any without handrails?: No Home free of loose throw rugs in walkways, pet beds,  electrical cords, etc?: Yes Adequate lighting in your home to reduce risk of falls?: Yes Life alert?: No Use of a cane, walker or w/c?: Yes Grab bars in the bathroom?: Yes Shower chair or bench in shower?: Yes Elevated toilet seat or a handicapped toilet?: Yes  TIMED UP AND GO:  Was the test performed?  No    Cognitive Function:    06/17/2023    2:34 PM 06/13/2022   12:42 PM 06/12/2021    3:08 PM  MMSE - Mini Mental State Exam  Not completed: Unable to complete Unable to complete   Orientation to time   3  Orientation to Place   3  Registration   3  Attention/ Calculation   3  Recall   2  Language- name 2 objects   2  Language- repeat   1  Language- follow 3 step command   3  Language- read & follow direction   1  Write a sentence   0  Copy design   0  Total score   21        06/12/2021    3:09 PM 06/06/2020   11:21 AM 07/21/2018   12:35 PM 07/11/2017   12:25 PM  6CIT Screen  What Year? 0 points 0 points 4 points 4 points  What month? 3 points 3 points 0 points 3 points  What time? 3 points 3 points 0 points 0 points  Count back from 20 4 points 4 points 0 points 0 points  Months in reverse 4 points 4 points 4 points 0 points  Repeat phrase 8 points 8 points 6 points 10 points  Total Score 22 points 22 points 14 points 17 points    Immunizations Immunization History  Administered Date(s) Administered   Fluad Quad(high Dose 65+) 07/24/2022   Influenza,inj,Quad PF,6+ Mos 07/26/2021   Influenza-Unspecified 07/28/2014, 07/26/2017, 07/24/2018, 07/27/2019, 07/29/2020   Moderna Covid-19 Vaccine Bivalent Booster 69yrs & up 08/15/2021   Moderna SARS-COV2 Booster Vaccination 01/22/2021, 03/13/2022, 08/28/2022   Moderna Sars-Covid-2 Vaccination 10/28/2019, 02/17/2020, 08/25/2020   PNEUMOCOCCAL CONJUGATE-20 07/28/2021   Pneumococcal Conjugate-13 06/21/2017   Pneumococcal-Unspecified 08/01/2016   Rsv, Bivalent, Protein Subunit Rsvpref,pf Verdis Frederickson) 09/24/2022   Tdap  07/11/2017   Zoster Recombinant(Shingrix) 11/15/2021, 02/16/2022    TDAP status: Up to date  Flu Vaccine status: Up to date  Pneumococcal vaccine status: Up to date  Covid-19 vaccine status: Completed vaccines  Qualifies for Shingles Vaccine? No   Zostavax completed Yes   Shingrix Completed?: No.    Education has been provided regarding the importance of this vaccine. Patient has been advised to call insurance company to determine out of pocket expense if they have not yet received this vaccine. Advised may also receive vaccine at local pharmacy or Health Dept. Verbalized acceptance and understanding.  Screening Tests Health Maintenance  Topic Date Due   COVID-19 Vaccine (5 - 2023-24 season) 10/23/2022   Medicare Annual Wellness (AWV)  06/13/2023   INFLUENZA VACCINE  05/23/2023   DTaP/Tdap/Td (2 - Td or  Tdap) 07/12/2027   Pneumonia Vaccine 28+ Years old  Completed   DEXA SCAN  Completed   Zoster Vaccines- Shingrix  Completed   HPV VACCINES  Aged Out    Health Maintenance  Health Maintenance Due  Topic Date Due   COVID-19 Vaccine (5 - 2023-24 season) 10/23/2022   Medicare Annual Wellness (AWV)  06/13/2023   INFLUENZA VACCINE  05/23/2023    Colorectal cancer screening: No longer required.   Mammogram status: No longer required due to age.  Bone Density status: Completed 06-06-22. Results reflect: Bone density results: OSTEOPOROSIS. Repeat every 2 years.  Lung Cancer Screening: (Low Dose CT Chest recommended if Age 30-80 years, 20 pack-year currently smoking OR have quit w/in 15years.) does not qualify.   Lung Cancer Screening Referral:   Additional Screening:  Hepatitis C Screening: does not qualify; Completed   Vision Screening: Recommended annual ophthalmology exams for early detection of glaucoma and other disorders of the eye. Is the patient up to date with their annual eye exam?  No  Who is the provider or what is the name of the office in which the patient  attends annual eye exams?  If pt is not established with a provider, would they like to be referred to a provider to establish care? No .   Dental Screening: Recommended annual dental exams for proper oral hygiene  Diabetic Foot Exam: Diabetic Foot Exam: Completed    Community Resource Referral / Chronic Care Management: CRR required this visit?  No   CCM required this visit?  No     Plan:     I have personally reviewed and noted the following in the patient's chart:   Medical and social history Use of alcohol, tobacco or illicit drugs  Current medications and supplements including opioid prescriptions. Patient is not currently taking opioid prescriptions. Functional ability and status Nutritional status Physical activity Advanced directives List of other physicians Hospitalizations, surgeries, and ER visits in previous 12 months Vitals Screenings to include cognitive, depression, and falls Referrals and appointments  In addition, I have reviewed and discussed with patient certain preventive protocols, quality metrics, and best practice recommendations. A written personalized care plan for preventive services as well as general preventive health recommendations were provided to patient.     Sharee Holster, NP   06/17/2023   After Visit Summary: (MyChart) Due to this being a telephonic visit, the after visit summary with patients personalized plan was offered to patient via MyChart   Nurse Notes: this exam was performed at this facility by myself

## 2023-06-17 NOTE — Patient Instructions (Signed)
  Cynthia [name] , Thank you for taking time to come for your Medicare Wellness Visit. I appreciate your ongoing commitment to your health goals. Please review the following plan we discussed and let me know if I can assist you in the future.   These are the goals we discussed:  Goals      DIET - INCREASE WATER INTAKE     Follow up with Provider as scheduled     General - Client will not be readmitted within 30 days (C-SNP)        This is a list of the screening recommended for you and due dates:  Health Maintenance  Topic Date Due   COVID-19 Vaccine (5 - 2023-24 season) 10/23/2022   Medicare Annual Wellness Visit  06/13/2023   Flu Shot  05/23/2023   DTaP/Tdap/Td vaccine (2 - Td or Tdap) 07/12/2027   Pneumonia Vaccine  Completed   DEXA scan (bone density measurement)  Completed   Zoster (Shingles) Vaccine  Completed   HPV Vaccine  Aged Out

## 2023-06-21 ENCOUNTER — Encounter: Payer: Self-pay | Admitting: Internal Medicine

## 2023-06-21 ENCOUNTER — Non-Acute Institutional Stay: Payer: Self-pay | Admitting: Internal Medicine

## 2023-06-21 DIAGNOSIS — E441 Mild protein-calorie malnutrition: Secondary | ICD-10-CM

## 2023-06-21 DIAGNOSIS — F03918 Unspecified dementia, unspecified severity, with other behavioral disturbance: Secondary | ICD-10-CM | POA: Diagnosis not present

## 2023-06-21 NOTE — Assessment & Plan Note (Signed)
Initially she began to answer questions.  There was suggestion that she might be having some dysphagia but at that point she refused to elaborate and refused to answer further questions or to have any exam performed.  Staff reports this is common behavior as she refuses any interventions with special reference to intermittent drainage from the common bile duct tube insertion site.  She is on generic Aricept which is of questionable value.  I will discuss possible change in therapy with the NP.

## 2023-06-21 NOTE — Assessment & Plan Note (Signed)
Albumin is decreased to 2.9 but total protein was 6.8.  Interosseous wasting and some limb atrophy validates protein/caloric malnutrition.  Nutritionist will assess but typically the patient declines any evaluations or interventions.

## 2023-06-21 NOTE — Progress Notes (Signed)
   NURSING HOME LOCATION:  [facility] ROOM NUMBER:  [id number]  CODE STATUS:  DNR  PCP:  Synthia Innocent NP  This is a nursing facility follow up visit of chronic medical diagnoses & to document compliance with Regulation 483.30 (c) in The Long Term Care Survey Manual Phase 2 which mandates caregiver visit ( visits can alternate among physician, PA or NP as per statutes) within 10 days of 30 days / 60 days/ 90 days post admission to SNF date    Interim medical record and care since last SNF visit was updated with review of diagnostic studies and change in clinical status since last visit were documented.  HPI: She is a permanent resident of this facility with medical diagnoses of aortic atherosclerosis, vitamin D deficiency, history of thyroid disease, essential hypertension, history of breast cancer, and dementia. Most recent labs were performed 02/18/2023 and revealed slight progression in her hypocalcemia with a value of 8.4.  Although albumin was decreased to 2.9; total protein was normal at 6.8.  There has been slight progression in her normochromic, normocytic anemia with most recent H/H of 11.2/34.8.  Review of systems: Mood disorder associated with her dementia precluded completion of review of systems. Staff reports that she declines any interventions or treatments as a rule.  This specifically refers to reevaluating intermittent green drainage from the site of prior common bile duct tube placement.  Staff did not report any signs of infection at this site.  Physical exam:  Pertinent or positive findings: Initially she was asleep with her neck and head hyperextended and mouth agape.  She was exhibiting hypopnea without frank apnea or snoring.  There was visible malalignment of the teeth with plaque and coating.  Lower lids are somewhat puffy and lacrimal gland slightly prominent.  Her hands were crossed across the upper abdomen and revealed interosseous wasting.  No  significant edema was visible of the lower extremities. She awoke wit a start and declared "you scared me !"  I explained that this was a routine visit required by Medicare to assess her clinical status.  Initially she began to answer my questions and possibly indicated she was having intermittent dysphagia.  When I asked which foods her response was "depends if too big."  At that point she became very defensive and refused to answer any more questions and also refused any further physical exam.  See summary under each active problem in the Problem List with associated updated therapeutic plan

## 2023-06-21 NOTE — Patient Instructions (Signed)
See assessment and plan under each diagnosis in the problem list and acutely for this visit 

## 2023-07-18 ENCOUNTER — Other Ambulatory Visit: Payer: Self-pay | Admitting: Adult Health

## 2023-07-18 MED ORDER — ALPRAZOLAM 0.5 MG PO TABS: 0.5000 mg | ORAL_TABLET | Freq: Every day | ORAL | 0 refills | Status: DC | PRN

## 2023-07-22 ENCOUNTER — Non-Acute Institutional Stay (SKILLED_NURSING_FACILITY): Payer: Medicare Other | Admitting: Adult Health

## 2023-07-22 ENCOUNTER — Encounter: Payer: Self-pay | Admitting: Adult Health

## 2023-07-22 DIAGNOSIS — K5909 Other constipation: Secondary | ICD-10-CM | POA: Diagnosis not present

## 2023-07-22 DIAGNOSIS — M810 Age-related osteoporosis without current pathological fracture: Secondary | ICD-10-CM

## 2023-07-22 DIAGNOSIS — K8061 Calculus of gallbladder and bile duct with cholecystitis, unspecified, with obstruction: Secondary | ICD-10-CM | POA: Diagnosis not present

## 2023-07-22 NOTE — Progress Notes (Signed)
Location:  [facility] Room Number: 141 Place of Service:  SNF (31)   CODE STATUS: dnr   No Known Allergies  Chief Complaint  Patient presents with   Medical Management of Chronic Issues         Calculus of gall bladder without acute cholecystitis and obstruction:  Chronic constipation:  Post menopausal osteoporosis      HPI:  She is a 89 year old long term resident of this facility being seen for the management of her chronic illnesses: Calculus of gall bladder without acute cholecystitis and obstruction:  Chronic constipation:  Post menopausal osteoporosis .  There are no reports of uncontrolled pain. Her weight is stable. There are no reports of anxiety or depressive thoughts.   Past Medical History:  Diagnosis Date   Aortic atherosclerosis (HCC) 11/15/2020   Breast cancer (HCC)    Cognitive communication deficit    Dementia (HCC)    Difficulty in walking(719.7)    Fall    HTN (hypertension) 08/26/2016   Muscle weakness (generalized)    Rash and nonspecific skin eruption    Thyroid disease    hypo   Urinary tract infection, site not specified    Vitamin B deficiency     Past Surgical History:  Procedure Laterality Date   COLON SURGERY     IR CATHETER TUBE CHANGE  05/07/2017   IR EXCHANGE BILIARY DRAIN  07/02/2017   IR EXCHANGE BILIARY DRAIN  07/10/2017   IR EXCHANGE BILIARY DRAIN  08/28/2017   IR EXCHANGE BILIARY DRAIN  10/11/2017   IR EXCHANGE BILIARY DRAIN  10/28/2017   IR EXCHANGE BILIARY DRAIN  12/23/2017   IR EXCHANGE BILIARY DRAIN  01/23/2018   IR EXCHANGE BILIARY DRAIN  02/17/2018   IR EXCHANGE BILIARY DRAIN  03/20/2018   IR EXCHANGE BILIARY DRAIN  04/17/2018   IR EXCHANGE BILIARY DRAIN  05/29/2018   IR EXCHANGE BILIARY DRAIN  07/24/2018   IR EXCHANGE BILIARY DRAIN  09/22/2018   IR EXCHANGE BILIARY DRAIN  11/20/2018   IR EXCHANGE BILIARY DRAIN  02/11/2019   IR EXCHANGE BILIARY DRAIN  04/14/2019   IR EXCHANGE BILIARY DRAIN  06/09/2019   IR  EXCHANGE BILIARY DRAIN  11/26/2019   IR EXCHANGE BILIARY DRAIN  12/23/2019   IR EXCHANGE BILIARY DRAIN  02/22/2020   IR EXCHANGE BILIARY DRAIN  05/25/2020   IR EXCHANGE BILIARY DRAIN  07/29/2020   IR EXCHANGE BILIARY DRAIN  08/30/2020   IR EXCHANGE BILIARY DRAIN  10/06/2020   IR EXCHANGE BILIARY DRAIN  11/18/2020   IR EXCHANGE BILIARY DRAIN  12/21/2020   IR EXCHANGE BILIARY DRAIN  02/03/2021   IR EXCHANGE BILIARY DRAIN  03/07/2021   IR EXCHANGE BILIARY DRAIN  05/04/2021   IR EXCHANGE BILIARY DRAIN  06/22/2021   IR EXCHANGE BILIARY DRAIN  07/04/2021   IR EXCHANGE BILIARY DRAIN  08/10/2021   IR PERC CHOLECYSTOSTOMY  01/27/2017   IR PERC CHOLECYSTOSTOMY  11/18/2019   IR RADIOLOGIST EVAL & MGMT  03/05/2017   IR RADIOLOGIST EVAL & MGMT  01/23/2023   IR SINUS/FIST TUBE CHK-NON GI  10/06/2020   MASTECTOMY, PARTIAL Right     Social History   Socioeconomic History   Marital status: Widowed    Spouse name: Not on file   Number of children: Not on file   Years of education: Not on file   Highest education level: Not on file  Occupational History   Occupation: retired  Tobacco Use  Smoking status: Never   Smokeless tobacco: Never  Vaping Use   Vaping status: Never Used  Substance and Sexual Activity   Alcohol use: No   Drug use: No   Sexual activity: Not Currently  Other Topics Concern   Not on file  Social History Narrative   Long term resident of [facility]    Social Determinants of Health   Financial Resource Strain: Low Risk  (07/21/2018)   Overall Financial Resource Strain (CARDIA)    Difficulty of Paying Living Expenses: Not hard at all  Food Insecurity: No Food Insecurity (07/21/2018)   Hunger Vital Sign    Worried About Running Out of Food in the Last Year: Never true    Ran Out of Food in the Last Year: Never true  Transportation Needs: No Transportation Needs (07/21/2018)   PRAPARE - Administrator, Civil Service (Medical): No    Lack of Transportation (Non-Medical): No  Physical  Activity: Inactive (07/21/2018)   Exercise Vital Sign    Days of Exercise per Week: 0 days    Minutes of Exercise per Session: 0 min  Stress: No Stress Concern Present (07/21/2018)   [employer] of Occupational Health - Occupational Stress Questionnaire    Feeling of Stress : Not at all  Social Connections: Socially Isolated (07/21/2018)   Social Connection and Isolation Panel [NHANES]    Frequency of Communication with Friends and Family: Never    Frequency of Social Gatherings with Friends and Family: Never    Attends Religious Services: Never    Database administrator or Organizations: No    Attends Banker Meetings: Never    Marital Status: Widowed  Intimate Partner Violence: Not At Risk (07/21/2018)   Humiliation, Afraid, Rape, and Kick questionnaire    Fear of Current or Ex-Partner: No    Emotionally Abused: No    Physically Abused: No    Sexually Abused: No   Family History  Problem Relation Age of Onset   Cancer Father        ? primary   Diabetes Neg Hx    Heart disease Neg Hx    Stroke Neg Hx       VITAL SIGNS BP 128/76   Pulse 72   Temp 98.1 F (36.7 C)   Resp 20   Ht 5\' 3"  (1.6 m)   Wt 129 lb 9.6 oz (58.8 kg)   SpO2 99%   BMI 22.96 kg/m   Outpatient Encounter Medications as of 07/22/2023  Medication Sig   ALPRAZolam (XANAX) 0.5 MG tablet Take 1 tablet (0.5 mg total) by mouth daily as needed for anxiety.   Balsam [place]-Castor Oil (VENELEX) OINT Apply topically. Apply to bilateral buttocks qshift for erythema.   donepezil (ARICEPT) 10 MG tablet Take 1 tablet by mouth at bedtime.   No facility-administered encounter medications on file as of 07/22/2023.     SIGNIFICANT DIAGNOSTIC EXAMS  DIAGNOSTIC EXAMS  PREVIOUS   06-06-22: dexa scan: t score -2.464    LABS REVIEWED: PREVIOUS:    07-30-22: wbc 4.9; hgb 11.9; hct 37.4; mcv 97.9 plt 262; glucose 77; bun 16; creat 0.81; k+ 4.1; na++ 142; ca 8.5 gfr >60; protein 6.5 albumin 3.0; vitamin  B 12: 636; vitamin D 51.61 02-18-23: wbc 6.5; hgb 11.2; hct 34.8; mcv 96.4 plt 234; glucose 90; bun 19; creat 0.81; k+ 3.8; na++ 140; ca 8.4 gfr >60; protein 6.8 albumin 2.9     NO NEW LABS.   Review  of Systems  Constitutional:  Negative for malaise/fatigue.  Respiratory:  Negative for cough and shortness of breath.   Cardiovascular:  Negative for chest pain, palpitations and leg swelling.  Gastrointestinal:  Negative for abdominal pain, constipation and heartburn.  Musculoskeletal:  Negative for back pain, joint pain and myalgias.  Skin: Negative.   Neurological:  Negative for dizziness.  Psychiatric/Behavioral:  The patient is not nervous/anxious.     Physical Exam Constitutional:      General: She is not in acute distress.    Appearance: She is well-developed. She is not diaphoretic.  Neck:     Thyroid: No thyromegaly.  Cardiovascular:     Rate and Rhythm: Normal rate and regular rhythm.     Pulses: Normal pulses.     Heart sounds: Murmur heard.  Pulmonary:     Effort: Pulmonary effort is normal. No respiratory distress.     Breath sounds: Normal breath sounds.  Chest:  Breasts:    Right: Absent.     Comments: Partial right mastectomy  Abdominal:     General: Bowel sounds are normal. There is no distension.     Palpations: Abdomen is soft.     Tenderness: There is no abdominal tenderness.  Musculoskeletal:        General: Normal range of motion.     Cervical back: Neck supple.     Right lower leg: No edema.     Left lower leg: No edema.  Lymphadenopathy:     Cervical: No cervical adenopathy.  Skin:    General: Skin is warm and dry.  Neurological:     Mental Status: She is alert. Mental status is at baseline.  Psychiatric:        Mood and Affect: Mood normal.      ASSESSMENT/ PLAN:  TODAY  Calculus of gall bladder without acute cholecystitis and obstruction: her chole drain remains out  2. Chronic constipation: will continue miralax daily as needed  3.  Post menopausal osteoporosis t score -2.464 will monitor    PREVIOUS   4. Herpes: without outbreak  5. Protein calorie malnutrition: albumin 3.0 will continue supplements as directed  6. Chronic anemia: hgb 11.9   7. Aortic atherosclerosis (ct 08-26-19) not on statin due to advanced age  70. Major neurocognitive disorder/vascular dementia without behavioral disturbance: weight is 129 pounds; will continue aricept 10 mg daily   9. Primary hypertension: b/p: 128/76    Synthia Innocent NP [facility]  call [phone]

## 2023-07-30 DIAGNOSIS — Z23 Encounter for immunization: Secondary | ICD-10-CM | POA: Diagnosis not present

## 2023-08-02 ENCOUNTER — Non-Acute Institutional Stay (SKILLED_NURSING_FACILITY): Payer: Medicare Other | Admitting: Adult Health

## 2023-08-02 ENCOUNTER — Encounter: Payer: Self-pay | Admitting: Adult Health

## 2023-08-02 DIAGNOSIS — F0150 Vascular dementia without behavioral disturbance: Secondary | ICD-10-CM | POA: Diagnosis not present

## 2023-08-02 DIAGNOSIS — N182 Chronic kidney disease, stage 2 (mild): Secondary | ICD-10-CM | POA: Diagnosis not present

## 2023-08-02 DIAGNOSIS — I700 Atherosclerosis of aorta: Secondary | ICD-10-CM

## 2023-08-02 NOTE — Progress Notes (Signed)
Location:  [facility] Room Number: 141D Place of Service:  SNF (31)   CODE STATUS: DNR  No Known Allergies  Chief Complaint  Patient presents with   Acute Visit    Patient is being seen for Acute care plan meeting    HPI:  We have come together for her care plan meeting. NO BIMS or mood. Oriented to self only. Is out of bed as desired without falls. She is independent with her adl care. She is frequently incontinent of bladder and bowel. Dietary: setup for meals; weight is 128.6 pounds; D3 diet appetite 26-76%. Therapy: none at this time. Activities: is sociable. She continues to be followed for her chronic illnesses including:   Aortic atherosclerosis    Vascular dementia without behavioral disturbance   Stage 2 chronic kidney disease  Past Medical History:  Diagnosis Date   Aortic atherosclerosis (HCC) 11/15/2020   Breast cancer (HCC)    Cognitive communication deficit    Dementia (HCC)    Difficulty in walking(719.7)    Fall    HTN (hypertension) 08/26/2016   Muscle weakness (generalized)    Rash and nonspecific skin eruption    Thyroid disease    hypo   Urinary tract infection, site not specified    Vitamin B deficiency     Past Surgical History:  Procedure Laterality Date   COLON SURGERY     IR CATHETER TUBE CHANGE  05/07/2017   IR EXCHANGE BILIARY DRAIN  07/02/2017   IR EXCHANGE BILIARY DRAIN  07/10/2017   IR EXCHANGE BILIARY DRAIN  08/28/2017   IR EXCHANGE BILIARY DRAIN  10/11/2017   IR EXCHANGE BILIARY DRAIN  10/28/2017   IR EXCHANGE BILIARY DRAIN  12/23/2017   IR EXCHANGE BILIARY DRAIN  01/23/2018   IR EXCHANGE BILIARY DRAIN  02/17/2018   IR EXCHANGE BILIARY DRAIN  03/20/2018   IR EXCHANGE BILIARY DRAIN  04/17/2018   IR EXCHANGE BILIARY DRAIN  05/29/2018   IR EXCHANGE BILIARY DRAIN  07/24/2018   IR EXCHANGE BILIARY DRAIN  09/22/2018   IR EXCHANGE BILIARY DRAIN  11/20/2018   IR EXCHANGE BILIARY DRAIN  02/11/2019   IR EXCHANGE BILIARY DRAIN  04/14/2019    IR EXCHANGE BILIARY DRAIN  06/09/2019   IR EXCHANGE BILIARY DRAIN  11/26/2019   IR EXCHANGE BILIARY DRAIN  12/23/2019   IR EXCHANGE BILIARY DRAIN  02/22/2020   IR EXCHANGE BILIARY DRAIN  05/25/2020   IR EXCHANGE BILIARY DRAIN  07/29/2020   IR EXCHANGE BILIARY DRAIN  08/30/2020   IR EXCHANGE BILIARY DRAIN  10/06/2020   IR EXCHANGE BILIARY DRAIN  11/18/2020   IR EXCHANGE BILIARY DRAIN  12/21/2020   IR EXCHANGE BILIARY DRAIN  02/03/2021   IR EXCHANGE BILIARY DRAIN  03/07/2021   IR EXCHANGE BILIARY DRAIN  05/04/2021   IR EXCHANGE BILIARY DRAIN  06/22/2021   IR EXCHANGE BILIARY DRAIN  07/04/2021   IR EXCHANGE BILIARY DRAIN  08/10/2021   IR PERC CHOLECYSTOSTOMY  01/27/2017   IR PERC CHOLECYSTOSTOMY  11/18/2019   IR RADIOLOGIST EVAL & MGMT  03/05/2017   IR RADIOLOGIST EVAL & MGMT  01/23/2023   IR SINUS/FIST TUBE CHK-NON GI  10/06/2020   MASTECTOMY, PARTIAL Right     Social History   Socioeconomic History   Marital status: Widowed    Spouse name: Not on file   Number of children: Not on file   Years of education: Not on file   Highest education level: Not on file  Occupational  History   Occupation: retired  Tobacco Use   Smoking status: Never   Smokeless tobacco: Never  Vaping Use   Vaping status: Never Used  Substance and Sexual Activity   Alcohol use: No   Drug use: No   Sexual activity: Not Currently  Other Topics Concern   Not on file  Social History Narrative   Long term resident of [facility]    Social Determinants of Health   Financial Resource Strain: Low Risk  (07/21/2018)   Overall Financial Resource Strain (CARDIA)    Difficulty of Paying Living Expenses: Not hard at all  Food Insecurity: No Food Insecurity (07/21/2018)   Hunger Vital Sign    Worried About Running Out of Food in the Last Year: Never true    Ran Out of Food in the Last Year: Never true  Transportation Needs: No Transportation Needs (07/21/2018)   PRAPARE - Administrator, Civil Service (Medical): No    Lack of  Transportation (Non-Medical): No  Physical Activity: Inactive (07/21/2018)   Exercise Vital Sign    Days of Exercise per Week: 0 days    Minutes of Exercise per Session: 0 min  Stress: No Stress Concern Present (07/21/2018)   [employer] of Occupational Health - Occupational Stress Questionnaire    Feeling of Stress : Not at all  Social Connections: Socially Isolated (07/21/2018)   Social Connection and Isolation Panel [NHANES]    Frequency of Communication with Friends and Family: Never    Frequency of Social Gatherings with Friends and Family: Never    Attends Religious Services: Never    Database administrator or Organizations: No    Attends Banker Meetings: Never    Marital Status: Widowed  Intimate Partner Violence: Not At Risk (07/21/2018)   Humiliation, Afraid, Rape, and Kick questionnaire    Fear of Current or Ex-Partner: No    Emotionally Abused: No    Physically Abused: No    Sexually Abused: No   Family History  Problem Relation Age of Onset   Cancer Father        ? primary   Diabetes Neg Hx    Heart disease Neg Hx    Stroke Neg Hx       VITAL SIGNS BP 116/63   Pulse 80   Temp (!) 97.5 F (36.4 C) (Temporal)   Resp 20   Ht 5\' 3"  (1.6 m)   Wt 128 lb 9.6 oz (58.3 kg)   SpO2 98%   BMI 22.78 kg/m   Outpatient Encounter Medications as of 08/02/2023  Medication Sig   acetaminophen (TYLENOL) 325 MG tablet Take 650 mg by mouth every 6 (six) hours as needed for mild pain.   ALPRAZolam (XANAX) 0.5 MG tablet Take 1 tablet (0.5 mg total) by mouth daily as needed for anxiety.   donepezil (ARICEPT) 10 MG tablet Take 1 tablet by mouth at bedtime.   Balsam [place]-Castor Oil (VENELEX) OINT Apply topically. Apply to bilateral buttocks qshift for erythema. (Patient not taking: Reported on 08/02/2023)   No facility-administered encounter medications on file as of 08/02/2023.     SIGNIFICANT DIAGNOSTIC EXAMS  DIAGNOSTIC EXAMS  PREVIOUS   06-06-22:  dexa scan: t score -2.464    LABS REVIEWED: PREVIOUS:    07-30-22: wbc 4.9; hgb 11.9; hct 37.4; mcv 97.9 plt 262; glucose 77; bun 16; creat 0.81; k+ 4.1; na++ 142; ca 8.5 gfr >60; protein 6.5 albumin 3.0; vitamin B 12: 636; vitamin D 51.61  02-18-23: wbc 6.5; hgb 11.2; hct 34.8; mcv 96.4 plt 234; glucose 90; bun 19; creat 0.81; k+ 3.8; na++ 140; ca 8.4 gfr >60; protein 6.8 albumin 2.9     NO NEW LABS.   Review of Systems  Constitutional:  Negative for malaise/fatigue.  Respiratory:  Negative for cough and shortness of breath.   Cardiovascular:  Negative for chest pain, palpitations and leg swelling.  Gastrointestinal:  Negative for abdominal pain, constipation and heartburn.  Musculoskeletal:  Negative for back pain, joint pain and myalgias.  Skin: Negative.   Neurological:  Negative for dizziness.  Psychiatric/Behavioral:  The patient is not nervous/anxious.     Physical Exam Constitutional:      General: She is not in acute distress.    Appearance: She is well-developed. She is not diaphoretic.  Neck:     Thyroid: No thyromegaly.  Cardiovascular:     Rate and Rhythm: Normal rate and regular rhythm.     Pulses: Normal pulses.     Heart sounds: Murmur heard.  Pulmonary:     Effort: Pulmonary effort is normal. No respiratory distress.     Breath sounds: Normal breath sounds.  Chest:  Breasts:    Right: Absent.     Comments: Partial right mastectomy  Abdominal:     General: Bowel sounds are normal. There is no distension.     Palpations: Abdomen is soft.     Tenderness: There is no abdominal tenderness.  Musculoskeletal:        General: Normal range of motion.     Cervical back: Neck supple.     Right lower leg: No edema.     Left lower leg: No edema.  Lymphadenopathy:     Cervical: No cervical adenopathy.  Skin:    General: Skin is warm and dry.  Neurological:     Mental Status: She is alert. Mental status is at baseline.  Psychiatric:        Mood and Affect: Mood  normal.       ASSESSMENT/ PLAN:  TODAY  Aortic atherosclerosis Vascular dementia without behavioral disturbance Stage 2 chronic kidney disease  Will continue current medications Will continue current plan of care Will continue to monitor her status.   Time spent with patient: 40 minutes: medications; plan of care; dietary   Synthia Innocent NP [facility]   call [phone]

## 2023-08-20 DIAGNOSIS — Z23 Encounter for immunization: Secondary | ICD-10-CM | POA: Diagnosis not present

## 2023-08-22 ENCOUNTER — Encounter: Payer: Self-pay | Admitting: Adult Health

## 2023-08-22 ENCOUNTER — Non-Acute Institutional Stay (SKILLED_NURSING_FACILITY): Payer: Medicare Other | Admitting: Adult Health

## 2023-08-22 DIAGNOSIS — E441 Mild protein-calorie malnutrition: Secondary | ICD-10-CM | POA: Diagnosis not present

## 2023-08-22 DIAGNOSIS — B009 Herpesviral infection, unspecified: Secondary | ICD-10-CM | POA: Diagnosis not present

## 2023-08-22 DIAGNOSIS — D649 Anemia, unspecified: Secondary | ICD-10-CM | POA: Diagnosis not present

## 2023-08-22 NOTE — Progress Notes (Signed)
Location:  [facility] Room Number: 141 Place of Service:  SNF (31)   CODE STATUS: dnr   No Known Allergies  Chief Complaint  Patient presents with   Medical Management of Chronic Issues           Herpes  Protein calorie malnutrition:  Chronic anemia:      HPI:  She is a 89 year old long term resident of this facility being seen for the management of her chronic illnesses: Herpes  Protein calorie malnutrition:  Chronic anemia. There are no reports of pain; her chole drain remains out. Her weight is stable.   Past Medical History:  Diagnosis Date   Aortic atherosclerosis (HCC) 11/15/2020   Breast cancer (HCC)    Cognitive communication deficit    Dementia (HCC)    Difficulty in walking(719.7)    Fall    HTN (hypertension) 08/26/2016   Muscle weakness (generalized)    Rash and nonspecific skin eruption    Thyroid disease    hypo   Urinary tract infection, site not specified    Vitamin B deficiency     Past Surgical History:  Procedure Laterality Date   COLON SURGERY     IR CATHETER TUBE CHANGE  05/07/2017   IR EXCHANGE BILIARY DRAIN  07/02/2017   IR EXCHANGE BILIARY DRAIN  07/10/2017   IR EXCHANGE BILIARY DRAIN  08/28/2017   IR EXCHANGE BILIARY DRAIN  10/11/2017   IR EXCHANGE BILIARY DRAIN  10/28/2017   IR EXCHANGE BILIARY DRAIN  12/23/2017   IR EXCHANGE BILIARY DRAIN  01/23/2018   IR EXCHANGE BILIARY DRAIN  02/17/2018   IR EXCHANGE BILIARY DRAIN  03/20/2018   IR EXCHANGE BILIARY DRAIN  04/17/2018   IR EXCHANGE BILIARY DRAIN  05/29/2018   IR EXCHANGE BILIARY DRAIN  07/24/2018   IR EXCHANGE BILIARY DRAIN  09/22/2018   IR EXCHANGE BILIARY DRAIN  11/20/2018   IR EXCHANGE BILIARY DRAIN  02/11/2019   IR EXCHANGE BILIARY DRAIN  04/14/2019   IR EXCHANGE BILIARY DRAIN  06/09/2019   IR EXCHANGE BILIARY DRAIN  11/26/2019   IR EXCHANGE BILIARY DRAIN  12/23/2019   IR EXCHANGE BILIARY DRAIN  02/22/2020   IR EXCHANGE BILIARY DRAIN  05/25/2020   IR EXCHANGE BILIARY DRAIN   07/29/2020   IR EXCHANGE BILIARY DRAIN  08/30/2020   IR EXCHANGE BILIARY DRAIN  10/06/2020   IR EXCHANGE BILIARY DRAIN  11/18/2020   IR EXCHANGE BILIARY DRAIN  12/21/2020   IR EXCHANGE BILIARY DRAIN  02/03/2021   IR EXCHANGE BILIARY DRAIN  03/07/2021   IR EXCHANGE BILIARY DRAIN  05/04/2021   IR EXCHANGE BILIARY DRAIN  06/22/2021   IR EXCHANGE BILIARY DRAIN  07/04/2021   IR EXCHANGE BILIARY DRAIN  08/10/2021   IR PERC CHOLECYSTOSTOMY  01/27/2017   IR PERC CHOLECYSTOSTOMY  11/18/2019   IR RADIOLOGIST EVAL & MGMT  03/05/2017   IR RADIOLOGIST EVAL & MGMT  01/23/2023   IR SINUS/FIST TUBE CHK-NON GI  10/06/2020   MASTECTOMY, PARTIAL Right     Social History   Socioeconomic History   Marital status: Widowed    Spouse name: Not on file   Number of children: Not on file   Years of education: Not on file   Highest education level: Not on file  Occupational History   Occupation: retired  Tobacco Use   Smoking status: Never   Smokeless tobacco: Never  Vaping Use   Vaping status: Never Used  Substance and Sexual  Activity   Alcohol use: No   Drug use: No   Sexual activity: Not Currently  Other Topics Concern   Not on file  Social History Narrative   Long term resident of [facility]    Social Determinants of Health   Financial Resource Strain: Low Risk  (07/21/2018)   Overall Financial Resource Strain (CARDIA)    Difficulty of Paying Living Expenses: Not hard at all  Food Insecurity: No Food Insecurity (07/21/2018)   Hunger Vital Sign    Worried About Running Out of Food in the Last Year: Never true    Ran Out of Food in the Last Year: Never true  Transportation Needs: No Transportation Needs (07/21/2018)   PRAPARE - Administrator, Civil Service (Medical): No    Lack of Transportation (Non-Medical): No  Physical Activity: Inactive (07/21/2018)   Exercise Vital Sign    Days of Exercise per Week: 0 days    Minutes of Exercise per Session: 0 min  Stress: No Stress Concern Present  (07/21/2018)   [employer] of Occupational Health - Occupational Stress Questionnaire    Feeling of Stress : Not at all  Social Connections: Socially Isolated (07/21/2018)   Social Connection and Isolation Panel [NHANES]    Frequency of Communication with Friends and Family: Never    Frequency of Social Gatherings with Friends and Family: Never    Attends Religious Services: Never    Database administrator or Organizations: No    Attends Banker Meetings: Never    Marital Status: Widowed  Intimate Partner Violence: Not At Risk (07/21/2018)   Humiliation, Afraid, Rape, and Kick questionnaire    Fear of Current or Ex-Partner: No    Emotionally Abused: No    Physically Abused: No    Sexually Abused: No   Family History  Problem Relation Age of Onset   Cancer Father        ? primary   Diabetes Neg Hx    Heart disease Neg Hx    Stroke Neg Hx       VITAL SIGNS BP 133/79   Pulse 75   Temp (!) 97.4 F (36.3 C)   Resp 20   Ht 5\' 3"  (1.6 m)   Wt 128 lb 9.6 oz (58.3 kg)   SpO2 100%   BMI 22.78 kg/m   Outpatient Encounter Medications as of 08/22/2023  Medication Sig   acetaminophen (TYLENOL) 325 MG tablet Take 650 mg by mouth every 6 (six) hours as needed for mild pain.   ALPRAZolam (XANAX) 0.5 MG tablet Take 1 tablet (0.5 mg total) by mouth daily as needed for anxiety.   Balsam [place]-Castor Oil (VENELEX) OINT Apply topically. Apply to bilateral buttocks qshift for erythema. (Patient not taking: Reported on 08/02/2023)   donepezil (ARICEPT) 10 MG tablet Take 1 tablet by mouth at bedtime.   No facility-administered encounter medications on file as of 08/22/2023.     SIGNIFICANT DIAGNOSTIC EXAMS  DIAGNOSTIC EXAMS  PREVIOUS   06-06-22: dexa scan: t score -2.464    LABS REVIEWED: PREVIOUS:    07-30-22: wbc 4.9; hgb 11.9; hct 37.4; mcv 97.9 plt 262; glucose 77; bun 16; creat 0.81; k+ 4.1; na++ 142; ca 8.5 gfr >60; protein 6.5 albumin 3.0; vitamin B 12: 636;  vitamin D 51.61 02-18-23: wbc 6.5; hgb 11.2; hct 34.8; mcv 96.4 plt 234; glucose 90; bun 19; creat 0.81; k+ 3.8; na++ 140; ca 8.4 gfr >60; protein 6.8 albumin 2.9  NO NEW LABS.   Review of Systems  Constitutional:  Negative for malaise/fatigue.  Respiratory:  Negative for cough and shortness of breath.   Cardiovascular:  Negative for chest pain, palpitations and leg swelling.  Gastrointestinal:  Negative for abdominal pain, constipation and heartburn.  Musculoskeletal:  Negative for back pain, joint pain and myalgias.  Skin: Negative.   Neurological:  Negative for dizziness.  Psychiatric/Behavioral:  The patient is not nervous/anxious.    Physical Exam Constitutional:      General: She is not in acute distress.    Appearance: She is well-developed. She is not diaphoretic.  Neck:     Thyroid: No thyromegaly.  Cardiovascular:     Rate and Rhythm: Normal rate and regular rhythm.     Pulses: Normal pulses.     Heart sounds: Murmur heard.  Pulmonary:     Effort: Pulmonary effort is normal. No respiratory distress.     Breath sounds: Normal breath sounds.  Chest:  Breasts:    Right: Absent.     Comments:  Partial right mastectomy Abdominal:     General: Bowel sounds are normal. There is no distension.     Palpations: Abdomen is soft.     Tenderness: There is no abdominal tenderness.  Musculoskeletal:        General: Normal range of motion.     Cervical back: Neck supple.     Right lower leg: No edema.     Left lower leg: No edema.  Lymphadenopathy:     Cervical: No cervical adenopathy.  Skin:    General: Skin is warm and dry.  Neurological:     Mental Status: She is alert. Mental status is at baseline.  Psychiatric:        Mood and Affect: Mood normal.      ASSESSMENT/ PLAN:  TODAY  Herpes without outbreak  2. Protein calorie malnutrition: albumin 3.0 will continue supplements as directed  3. Chronic anemia: hgb 11.9   PREVIOUS   4. Aortic atherosclerosis  (ct 08-26-19) not on statin due to advanced age  5. Major neurocognitive disorder/vascular dementia without behavioral disturbance: weight is 128 pounds; will continue aricept 10 mg daily   6. Primary hypertension: b/p: 133/79   7. Calculus of gall bladder without acute cholecystitis and obstruction: her chole drain remains out  8. Chronic constipation: will continue miralax daily as needed  9. Post menopausal osteoporosis t score -2.464 will monitor   Will check cbc cmp tsh   Synthia Innocent NP [facility]  call [phone]

## 2023-08-26 ENCOUNTER — Other Ambulatory Visit (HOSPITAL_COMMUNITY)
Admission: RE | Admit: 2023-08-26 | Discharge: 2023-08-26 | Disposition: A | Discharge status: Home / Self Care | Payer: Medicare Other | Source: Skilled Nursing Facility | Attending: Adult Health | Admitting: Adult Health

## 2023-08-26 DIAGNOSIS — F0390 Unspecified dementia without behavioral disturbance: Secondary | ICD-10-CM | POA: Diagnosis present

## 2023-08-26 LAB — COMPREHENSIVE METABOLIC PANEL
ALT: 10 U/L (ref 0–44)
AST: 14 U/L — ABNORMAL LOW (ref 15–41)
Albumin: 2.7 g/dL — ABNORMAL LOW (ref 3.5–5.0)
Alkaline Phosphatase: 56 U/L (ref 38–126)
Anion gap: 8 (ref 5–15)
BUN: 13 mg/dL (ref 8–23)
CO2: 25 mmol/L (ref 22–32)
Calcium: 8.4 mg/dL — ABNORMAL LOW (ref 8.9–10.3)
Chloride: 105 mmol/L (ref 98–111)
Creatinine, Ser: 0.85 mg/dL (ref 0.44–1.00)
GFR, Estimated: >60 mL/min (ref >60)
Glucose, Bld: 79 mg/dL (ref 70–99)
Potassium: 3.9 mmol/L (ref 3.5–5.1)
Sodium: 138 mmol/L (ref 135–145)
Total Bilirubin: 0.3 mg/dL (ref <1.2)
Total Protein: 6.5 g/dL (ref 6.5–8.1)

## 2023-08-26 LAB — CBC
HCT: 34 % — ABNORMAL LOW (ref 36.0–46.0)
Hemoglobin: 10.9 g/dL — ABNORMAL LOW (ref 12.0–15.0)
MCH: 30.9 pg (ref 26.0–34.0)
MCHC: 32.1 g/dL (ref 30.0–36.0)
MCV: 96.3 fL (ref 80.0–100.0)
Platelets: 288 10*3/uL (ref 150–400)
RBC: 3.53 MIL/uL — ABNORMAL LOW (ref 3.87–5.11)
RDW: 16.4 % — ABNORMAL HIGH (ref 11.5–15.5)
WBC: 5.3 10*3/uL (ref 4.0–10.5)
nRBC: 0 % (ref 0.0–0.2)

## 2023-08-26 LAB — TSH: TSH: 3.333 u[IU]/mL (ref 0.350–4.500)

## 2023-09-03 ENCOUNTER — Encounter: Payer: Self-pay | Admitting: Adult Health

## 2023-09-03 ENCOUNTER — Non-Acute Institutional Stay (SKILLED_NURSING_FACILITY): Payer: Medicare Other | Admitting: Adult Health

## 2023-09-03 DIAGNOSIS — K8061 Calculus of gallbladder and bile duct with cholecystitis, unspecified, with obstruction: Secondary | ICD-10-CM

## 2023-09-09 NOTE — Progress Notes (Signed)
Location:  [facility] Room Number: 110 Place of Service:  SNF (31)   CODE STATUS: dnr   No Known Allergies  Chief Complaint  Patient presents with   Acute Visit    Drain site     HPI:  She has a long history of having a chole drain. The last one she had was pulled out. She has since declined to have it replaced. The area today is red; warm; tender to touch with drainage present. There are no reports of fevers present.   Past Medical History:  Diagnosis Date   Aortic atherosclerosis (HCC) 11/15/2020   Breast cancer (HCC)    Cognitive communication deficit    Dementia (HCC)    Difficulty in walking(719.7)    Fall    HTN (hypertension) 08/26/2016   Muscle weakness (generalized)    Rash and nonspecific skin eruption    Thyroid disease    hypo   Urinary tract infection, site not specified    Vitamin B deficiency     Past Surgical History:  Procedure Laterality Date   COLON SURGERY     IR CATHETER TUBE CHANGE  05/07/2017   IR EXCHANGE BILIARY DRAIN  07/02/2017   IR EXCHANGE BILIARY DRAIN  07/10/2017   IR EXCHANGE BILIARY DRAIN  08/28/2017   IR EXCHANGE BILIARY DRAIN  10/11/2017   IR EXCHANGE BILIARY DRAIN  10/28/2017   IR EXCHANGE BILIARY DRAIN  12/23/2017   IR EXCHANGE BILIARY DRAIN  01/23/2018   IR EXCHANGE BILIARY DRAIN  02/17/2018   IR EXCHANGE BILIARY DRAIN  03/20/2018   IR EXCHANGE BILIARY DRAIN  04/17/2018   IR EXCHANGE BILIARY DRAIN  05/29/2018   IR EXCHANGE BILIARY DRAIN  07/24/2018   IR EXCHANGE BILIARY DRAIN  09/22/2018   IR EXCHANGE BILIARY DRAIN  11/20/2018   IR EXCHANGE BILIARY DRAIN  02/11/2019   IR EXCHANGE BILIARY DRAIN  04/14/2019   IR EXCHANGE BILIARY DRAIN  06/09/2019   IR EXCHANGE BILIARY DRAIN  11/26/2019   IR EXCHANGE BILIARY DRAIN  12/23/2019   IR EXCHANGE BILIARY DRAIN  02/22/2020   IR EXCHANGE BILIARY DRAIN  05/25/2020   IR EXCHANGE BILIARY DRAIN  07/29/2020   IR EXCHANGE BILIARY DRAIN  08/30/2020   IR EXCHANGE BILIARY DRAIN  10/06/2020    IR EXCHANGE BILIARY DRAIN  11/18/2020   IR EXCHANGE BILIARY DRAIN  12/21/2020   IR EXCHANGE BILIARY DRAIN  02/03/2021   IR EXCHANGE BILIARY DRAIN  03/07/2021   IR EXCHANGE BILIARY DRAIN  05/04/2021   IR EXCHANGE BILIARY DRAIN  06/22/2021   IR EXCHANGE BILIARY DRAIN  07/04/2021   IR EXCHANGE BILIARY DRAIN  08/10/2021   IR PERC CHOLECYSTOSTOMY  01/27/2017   IR PERC CHOLECYSTOSTOMY  11/18/2019   IR RADIOLOGIST EVAL & MGMT  03/05/2017   IR RADIOLOGIST EVAL & MGMT  01/23/2023   IR SINUS/FIST TUBE CHK-NON GI  10/06/2020   MASTECTOMY, PARTIAL Right     Social History   Socioeconomic History   Marital status: Widowed    Spouse name: Not on file   Number of children: Not on file   Years of education: Not on file   Highest education level: Not on file  Occupational History   Occupation: retired  Tobacco Use   Smoking status: Never   Smokeless tobacco: Never  Vaping Use   Vaping status: Never Used  Substance and Sexual Activity   Alcohol use: No   Drug use: No   Sexual activity: Not  Currently  Other Topics Concern   Not on file  Social History Narrative   Long term resident of [facility]    Social Determinants of Health   Financial Resource Strain: Low Risk  (07/21/2018)   Overall Financial Resource Strain (CARDIA)    Difficulty of Paying Living Expenses: Not hard at all  Food Insecurity: No Food Insecurity (07/21/2018)   Hunger Vital Sign    Worried About Running Out of Food in the Last Year: Never true    Ran Out of Food in the Last Year: Never true  Transportation Needs: No Transportation Needs (07/21/2018)   PRAPARE - Administrator, Civil Service (Medical): No    Lack of Transportation (Non-Medical): No  Physical Activity: Inactive (07/21/2018)   Exercise Vital Sign    Days of Exercise per Week: 0 days    Minutes of Exercise per Session: 0 min  Stress: No Stress Concern Present (07/21/2018)   [employer] of Occupational Health - Occupational Stress Questionnaire    Feeling  of Stress : Not at all  Social Connections: Socially Isolated (07/21/2018)   Social Connection and Isolation Panel [NHANES]    Frequency of Communication with Friends and Family: Never    Frequency of Social Gatherings with Friends and Family: Never    Attends Religious Services: Never    Database administrator or Organizations: No    Attends Banker Meetings: Never    Marital Status: Widowed  Intimate Partner Violence: Not At Risk (07/21/2018)   Humiliation, Afraid, Rape, and Kick questionnaire    Fear of Current or Ex-Partner: No    Emotionally Abused: No    Physically Abused: No    Sexually Abused: No   Family History  Problem Relation Age of Onset   Cancer Father        ? primary   Diabetes Neg Hx    Heart disease Neg Hx    Stroke Neg Hx       VITAL SIGNS BP 132/76   Pulse 80   Temp 97.8 F (36.6 C)   Resp 20   Ht 5\' 3"  (1.6 m)   Wt 128 lb 6.4 oz (58.2 kg)   SpO2 96%   BMI 22.75 kg/m   Outpatient Encounter Medications as of 09/03/2023  Medication Sig   amoxicillin-clavulanate (AUGMENTIN) 875-125 MG tablet Take 1 tablet by mouth 2 (two) times daily.   acetaminophen (TYLENOL) 325 MG tablet Take 650 mg by mouth every 6 (six) hours as needed for mild pain.   ALPRAZolam (XANAX) 0.5 MG tablet Take 1 tablet (0.5 mg total) by mouth daily as needed for anxiety.   Balsam [place]-Castor Oil (VENELEX) OINT Apply topically. Apply to bilateral buttocks qshift for erythema. (Patient not taking: Reported on 08/02/2023)   donepezil (ARICEPT) 10 MG tablet Take 1 tablet by mouth at bedtime.   No facility-administered encounter medications on file as of 09/03/2023.     SIGNIFICANT DIAGNOSTIC EXAMS  PREVIOUS   06-06-22: dexa scan: t score -2.464    LABS REVIEWED: PREVIOUS:    07-30-22: wbc 4.9; hgb 11.9; hct 37.4; mcv 97.9 plt 262; glucose 77; bun 16; creat 0.81; k+ 4.1; na++ 142; ca 8.5 gfr >60; protein 6.5 albumin 3.0; vitamin B 12: 636; vitamin D 51.61 02-18-23:  wbc 6.5; hgb 11.2; hct 34.8; mcv 96.4 plt 234; glucose 90; bun 19; creat 0.81; k+ 3.8; na++ 140; ca 8.4 gfr >60; protein 6.8 albumin 2.9     NO NEW LABS.  Review of Systems  Constitutional:  Negative for malaise/fatigue.  Respiratory:  Negative for cough and shortness of breath.   Cardiovascular:  Negative for chest pain, palpitations and leg swelling.  Gastrointestinal:  Positive for abdominal pain. Negative for constipation and heartburn.  Musculoskeletal:  Negative for back pain, joint pain and myalgias.  Skin: Negative.   Neurological:  Negative for dizziness.  Psychiatric/Behavioral:  The patient is not nervous/anxious.    Physical Exam Constitutional:      General: She is not in acute distress.    Appearance: She is well-developed. She is not diaphoretic.  Neck:     Thyroid: No thyromegaly.  Cardiovascular:     Rate and Rhythm: Normal rate and regular rhythm.     Pulses: Normal pulses.     Heart sounds: Murmur heard.  Pulmonary:     Effort: Pulmonary effort is normal. No respiratory distress.     Breath sounds: Normal breath sounds.  Chest:     Comments: Partial right mastectomy Abdominal:     General: Bowel sounds are normal. There is no distension.     Palpations: Abdomen is soft.     Tenderness: There is abdominal tenderness. There is guarding.     Comments: Right upper quad is tight; painful to palpation; red; warm with drainage present   Musculoskeletal:        General: Normal range of motion.     Cervical back: Neck supple.     Right lower leg: No edema.     Left lower leg: No edema.  Lymphadenopathy:     Cervical: No cervical adenopathy.  Skin:    General: Skin is warm and dry.  Neurological:     Mental Status: She is alert. Mental status is at baseline.  Psychiatric:        Mood and Affect: Mood normal.       ASSESSMENT/ PLAN:  TODAY  Calculus of gall bladder and bile duct with non acute cholecystitis with obstruction: she has declined  replacing tube. Will begin augmentin 875 mg twice daily through 09-11-23.    Synthia Innocent NP [facility]  call [phone]

## 2023-09-11 ENCOUNTER — Encounter: Payer: Self-pay | Admitting: Internal Medicine

## 2023-09-11 ENCOUNTER — Non-Acute Institutional Stay (SKILLED_NURSING_FACILITY): Payer: Medicare Other | Admitting: Internal Medicine

## 2023-09-11 DIAGNOSIS — F0150 Vascular dementia without behavioral disturbance: Secondary | ICD-10-CM

## 2023-09-11 DIAGNOSIS — E441 Mild protein-calorie malnutrition: Secondary | ICD-10-CM | POA: Diagnosis not present

## 2023-09-11 DIAGNOSIS — L03311 Cellulitis of abdominal wall: Secondary | ICD-10-CM

## 2023-09-11 DIAGNOSIS — D649 Anemia, unspecified: Secondary | ICD-10-CM | POA: Diagnosis not present

## 2023-09-11 NOTE — Assessment & Plan Note (Signed)
H/H decreased from 11.2/34.8 to 10.9/34. Indices normochromic /normocytic despite PMH of B12 deficiency. Last B12 on record therapeutic @ 636 on 07/30/22. Update indicated. Bleeding dyscrasias denied.

## 2023-09-11 NOTE — Assessment & Plan Note (Signed)
Staff reports she intermittently wanders & intermittently is non adherent with PO meds. Today she pleasantly demented & confabulating.

## 2023-09-11 NOTE — Progress Notes (Unsigned)
NURSING HOME LOCATION:  [facility] ROOM NUMBER:  110 D  CODE STATUS:  DNR  PCP:  Synthia Innocent NP  This is a nursing facility follow up visit of chronic medical diagnoses  & to document compliance with Regulation 483.30 (c) in The Long Term Care Survey Manual Phase 2 which mandates caregiver visit ( visits can alternate among physician, PA or NP as per statutes) within 10 days of 30 days / 60 days/ 90 days post admission to SNF date    Interim medical record and care since last SNF visit was updated with review of diagnostic studies and change in clinical status since last visit were documented.  HPI: She is a permanent resident of this facility with medical diagnoses of aortic atherosclerosis, history of B12 deficiency, history of hypothyroidism, essential hypertension, vascular dementia, and history of breast cancer. She is completing oral antibiotics for cellulitis @ the abdominal drain site. Most recent lab studies were performed 08/26/2023 and revealed stable hypocalcemia with a value of 8.4 and therapeutic TSH at 3.33.  There has been slight decrease in albumin from 2.9 down to 2.7 and in total protein from 6.8-6.5.  There has also been slight progression of normochromic, normocytic anemia with H/H dropping from 11.2/34.8 down to 10.9/34.0. WBC is normal despite cellulitis.  Review of systems: Dementia invalidated responses.  She denied any active symptoms and confabulated nonsensically.  She is essentially oblivious to the recent cellulitis at the abdominal drain site.  Staff states that she wanders and is intermittently nonadherent to oral medications.  When I asked if she had epistaxis to evaluate the anemia her response was "no, but I like to blow my nose."  She went on to confabulate about "I am all me."  She asked how long I have been here at this facility and told me that "I want out."  She then went on to recite a limerick about Uncle Ben shooting a rooster but  finding out it was a hen.  She asked me to sing @ that point.  Constitutional: No fever, significant weight change, fatigue  Eyes: No redness, discharge, pain, vision change ENT/mouth: No nasal congestion,  purulent discharge, earache, change in hearing, sore throat  Cardiovascular: No chest pain, palpitations, paroxysmal nocturnal dyspnea, claudication, edema  Respiratory: No cough, sputum production, hemoptysis, DOE, significant snoring, apnea   Gastrointestinal: No heartburn, dysphagia, abdominal pain, nausea /vomiting, rectal bleeding, melena, change in bowels Genitourinary: No dysuria, hematuria, pyuria, incontinence, nocturia Musculoskeletal: No joint stiffness, joint swelling, weakness, pain Dermatologic: No rash, pruritus, change in appearance of skin Neurologic: No dizziness, headache, syncope, seizures, numbness, tingling Psychiatric: No significant anxiety, depression, insomnia, anorexia Endocrine: No change in hair/skin/nails, excessive thirst, excessive hunger, excessive urination  Hematologic/lymphatic: No significant bruising, lymphadenopathy, abnormal bleeding Allergy/immunology: No itchy/watery eyes, significant sneezing, urticaria, angioedema  Physical exam:  Pertinent or positive findings: She appears her age and suboptimally nourished.  Initially she was asleep  with mouth agape.  She exhibited hypopnea without frank apnea or snoring.  Upon awakening , she began to confabulate. Oral exam revealed some staining of her teeth.  The left lacrimal gland is somewhat prominent.  Breath sounds are decreased.  Pedal pulses are decreased to palpation.  She has trace edema at the sock line.  There is faint bruising over the right hand.  She has interosseous wasting . General appearance: no acute distress, increased work of breathing is present.   Lymphatic: No lymphadenopathy about the head, neck, axilla.  Eyes: No conjunctival inflammation or lid edema is present. There is no scleral  icterus. Ears:  External ear exam shows no significant lesions or deformities.   Nose:  External nasal examination shows no deformity or inflammation. Nasal mucosa are pink and moist without lesions, exudates Neck:  No thyromegaly, masses, tenderness noted.    Heart:  Normal rate and regular rhythm. S1 and S2 normal without gallop, murmur, click, rub .  Lungs:  without wheezes, rhonchi, rales, rubs. Abdomen: Bowel sounds are normal. Abdomen is soft and nontender with no organomegaly, hernias, masses. GU: Deferred  Extremities:  No cyanosis, clubbing  Neurologic exam :Balance, Rhomberg, finger to nose testing could not be completed due to clinical state Skin: Warm & dry w/o tenting. No significant rash.  See summary under each active problem in the Problem List with associated updated therapeutic plan

## 2023-09-11 NOTE — Patient Instructions (Signed)
See assessment and plan under each diagnosis in the problem list and acutely for this visit 

## 2023-09-11 NOTE — Assessment & Plan Note (Signed)
Both albumin & total protein lower (2.7/2.9 & 6.5/6.8). IOW present . Nutritionist to monitor.

## 2023-09-17 ENCOUNTER — Encounter: Payer: Self-pay | Admitting: Adult Health

## 2023-09-17 NOTE — Progress Notes (Unsigned)
Location:  [facility] Room Number: 110 Place of Service:  SNF (31)   CODE STATUS: dnr   No Known Allergies  Chief Complaint  Patient presents with   Acute Visit    Care plan meeting     HPI:  We have come together for her care plan meeting. BIMS 5/15 mood 0/30. She is ambulatory with a walker; no falls. She requires independent assist with her adl care. She is frequently incontinent of bladder and bowel. Dietary: D3 with thin liquids appetite 26-100% weight is 128.4 pounds; is able to feed self. Therapy: none at this time. She will continue to be followed for her chronic illnesses including: Aortic atherosclerosis   Vascular dementia without behavioral disturbance   Chronic anemia:   Past Medical History:  Diagnosis Date   Aortic atherosclerosis (HCC) 11/15/2020   Breast cancer (HCC)    Cognitive communication deficit    Dementia (HCC)    Difficulty in walking(719.7)    Fall    HTN (hypertension) 08/26/2016   Muscle weakness (generalized)    Rash and nonspecific skin eruption    Thyroid disease    hypo   Urinary tract infection, site not specified    Vitamin B deficiency     Past Surgical History:  Procedure Laterality Date   COLON SURGERY     IR CATHETER TUBE CHANGE  05/07/2017   IR EXCHANGE BILIARY DRAIN  07/02/2017   IR EXCHANGE BILIARY DRAIN  07/10/2017   IR EXCHANGE BILIARY DRAIN  08/28/2017   IR EXCHANGE BILIARY DRAIN  10/11/2017   IR EXCHANGE BILIARY DRAIN  10/28/2017   IR EXCHANGE BILIARY DRAIN  12/23/2017   IR EXCHANGE BILIARY DRAIN  01/23/2018   IR EXCHANGE BILIARY DRAIN  02/17/2018   IR EXCHANGE BILIARY DRAIN  03/20/2018   IR EXCHANGE BILIARY DRAIN  04/17/2018   IR EXCHANGE BILIARY DRAIN  05/29/2018   IR EXCHANGE BILIARY DRAIN  07/24/2018   IR EXCHANGE BILIARY DRAIN  09/22/2018   IR EXCHANGE BILIARY DRAIN  11/20/2018   IR EXCHANGE BILIARY DRAIN  02/11/2019   IR EXCHANGE BILIARY DRAIN  04/14/2019   IR EXCHANGE BILIARY DRAIN  06/09/2019   IR  EXCHANGE BILIARY DRAIN  11/26/2019   IR EXCHANGE BILIARY DRAIN  12/23/2019   IR EXCHANGE BILIARY DRAIN  02/22/2020   IR EXCHANGE BILIARY DRAIN  05/25/2020   IR EXCHANGE BILIARY DRAIN  07/29/2020   IR EXCHANGE BILIARY DRAIN  08/30/2020   IR EXCHANGE BILIARY DRAIN  10/06/2020   IR EXCHANGE BILIARY DRAIN  11/18/2020   IR EXCHANGE BILIARY DRAIN  12/21/2020   IR EXCHANGE BILIARY DRAIN  02/03/2021   IR EXCHANGE BILIARY DRAIN  03/07/2021   IR EXCHANGE BILIARY DRAIN  05/04/2021   IR EXCHANGE BILIARY DRAIN  06/22/2021   IR EXCHANGE BILIARY DRAIN  07/04/2021   IR EXCHANGE BILIARY DRAIN  08/10/2021   IR PERC CHOLECYSTOSTOMY  01/27/2017   IR PERC CHOLECYSTOSTOMY  11/18/2019   IR RADIOLOGIST EVAL & MGMT  03/05/2017   IR RADIOLOGIST EVAL & MGMT  01/23/2023   IR SINUS/FIST TUBE CHK-NON GI  10/06/2020   MASTECTOMY, PARTIAL Right     Social History   Socioeconomic History   Marital status: Widowed    Spouse name: Not on file   Number of children: Not on file   Years of education: Not on file   Highest education level: Not on file  Occupational History   Occupation: retired  Tobacco Use  Smoking status: Never   Smokeless tobacco: Never  Vaping Use   Vaping status: Never Used  Substance and Sexual Activity   Alcohol use: No   Drug use: No   Sexual activity: Not Currently  Other Topics Concern   Not on file  Social History Narrative   Long term resident of [facility]    Social Determinants of Health   Financial Resource Strain: Low Risk  (07/21/2018)   Overall Financial Resource Strain (CARDIA)    Difficulty of Paying Living Expenses: Not hard at all  Food Insecurity: No Food Insecurity (07/21/2018)   Hunger Vital Sign    Worried About Running Out of Food in the Last Year: Never true    Ran Out of Food in the Last Year: Never true  Transportation Needs: No Transportation Needs (07/21/2018)   PRAPARE - Administrator, Civil Service (Medical): No    Lack of Transportation (Non-Medical): No  Physical  Activity: Inactive (07/21/2018)   Exercise Vital Sign    Days of Exercise per Week: 0 days    Minutes of Exercise per Session: 0 min  Stress: No Stress Concern Present (07/21/2018)   [employer] of Occupational Health - Occupational Stress Questionnaire    Feeling of Stress : Not at all  Social Connections: Socially Isolated (07/21/2018)   Social Connection and Isolation Panel [NHANES]    Frequency of Communication with Friends and Family: Never    Frequency of Social Gatherings with Friends and Family: Never    Attends Religious Services: Never    Database administrator or Organizations: No    Attends Banker Meetings: Never    Marital Status: Widowed  Intimate Partner Violence: Not At Risk (07/21/2018)   Humiliation, Afraid, Rape, and Kick questionnaire    Fear of Current or Ex-Partner: No    Emotionally Abused: No    Physically Abused: No    Sexually Abused: No   Family History  Problem Relation Age of Onset   Cancer Father        ? primary   Diabetes Neg Hx    Heart disease Neg Hx    Stroke Neg Hx       VITAL SIGNS BP (!) 146/64   Pulse 68   Temp (!) 97.1 F (36.2 C)   Resp 20   Ht 5\' 3"  (1.6 m)   Wt 128 lb 6.4 oz (58.2 kg)   SpO2 96%   BMI 22.75 kg/m   Outpatient Encounter Medications as of 09/18/2023  Medication Sig   acetaminophen (TYLENOL) 325 MG tablet Take 650 mg by mouth every 6 (six) hours as needed for mild pain.   ALPRAZolam (XANAX) 0.5 MG tablet Take 1 tablet (0.5 mg total) by mouth daily as needed for anxiety.   donepezil (ARICEPT) 10 MG tablet Take 1 tablet by mouth at bedtime.   No facility-administered encounter medications on file as of 09/18/2023.     SIGNIFICANT DIAGNOSTIC EXAMS  PREVIOUS   06-06-22: dexa scan: t score -2.464    LABS REVIEWED: PREVIOUS:    02-18-23: wbc 6.5; hgb 11.2; hct 34.8; mcv 96.4 plt 234; glucose 90; bun 19; creat 0.81; k+ 3.8; na++ 140; ca 8.4 gfr >60; protein 6.8 albumin 2.9     NO NEW LABS.    Review of Systems  Constitutional:  Negative for malaise/fatigue.  Respiratory:  Negative for cough and shortness of breath.   Cardiovascular:  Negative for chest pain, palpitations and leg swelling.  Gastrointestinal:  Negative for abdominal pain, constipation and heartburn.  Musculoskeletal:  Negative for back pain, joint pain and myalgias.  Skin: Negative.   Neurological:  Negative for dizziness.  Psychiatric/Behavioral:  The patient is not nervous/anxious.    Physical Exam Constitutional:      General: She is not in acute distress.    Appearance: She is well-developed. She is not diaphoretic.  Neck:     Thyroid: No thyromegaly.  Cardiovascular:     Rate and Rhythm: Normal rate and regular rhythm.     Pulses: Normal pulses.     Heart sounds: Murmur heard.  Pulmonary:     Effort: Pulmonary effort is normal. No respiratory distress.     Breath sounds: Normal breath sounds.  Abdominal:     General: Bowel sounds are normal. There is no distension.     Palpations: Abdomen is soft.     Tenderness: There is no abdominal tenderness.     Comments: Drain site present; without drain present   Musculoskeletal:        General: Normal range of motion.     Cervical back: Neck supple.     Right lower leg: No edema.     Left lower leg: No edema.  Lymphadenopathy:     Cervical: No cervical adenopathy.  Skin:    General: Skin is warm and dry.  Neurological:     Mental Status: She is alert. Mental status is at baseline.  Psychiatric:        Mood and Affect: Mood normal.      ASSESSMENT/ PLAN:  TODAY  Aortic atherosclerosis Vascular dementia without behavioral disturbance Chronic anemia:   Will continue current medications Will continue current plan of care  Will continue to monitor her status.   Time spent with patient: 40 minutes: medications; adjustment to room change; dietary.    Synthia Innocent NP [facility]   call [phone]

## 2023-09-18 ENCOUNTER — Non-Acute Institutional Stay (SKILLED_NURSING_FACILITY): Payer: Medicare Other | Admitting: Adult Health

## 2023-09-18 DIAGNOSIS — D649 Anemia, unspecified: Secondary | ICD-10-CM | POA: Diagnosis not present

## 2023-09-18 DIAGNOSIS — F0150 Vascular dementia without behavioral disturbance: Secondary | ICD-10-CM

## 2023-09-18 DIAGNOSIS — I700 Atherosclerosis of aorta: Secondary | ICD-10-CM

## 2023-10-07 ENCOUNTER — Non-Acute Institutional Stay (SKILLED_NURSING_FACILITY): Payer: Medicare Other | Admitting: Adult Health

## 2023-10-07 ENCOUNTER — Encounter: Payer: Self-pay | Admitting: Adult Health

## 2023-10-07 DIAGNOSIS — F0150 Vascular dementia without behavioral disturbance: Secondary | ICD-10-CM | POA: Diagnosis not present

## 2023-10-07 DIAGNOSIS — F0390 Unspecified dementia without behavioral disturbance: Secondary | ICD-10-CM

## 2023-10-07 DIAGNOSIS — I700 Atherosclerosis of aorta: Secondary | ICD-10-CM | POA: Diagnosis not present

## 2023-10-07 DIAGNOSIS — I10 Essential (primary) hypertension: Secondary | ICD-10-CM | POA: Diagnosis not present

## 2023-10-07 NOTE — Progress Notes (Signed)
Location:  [facility] Room Number: 114 Place of Service:  SNF (31)   CODE STATUS: dnr   No Known Allergies  Chief Complaint  Patient presents with   Medical Management of Chronic Issues         Aortic atherosclerosis     Major neurocognitive disorder/vascular dementia without behavioral disturbance;     Primary hypertension     HPI:  She is a 89 year old long term resident of this facility being seen for the management of her chronic illnesses: Aortic atherosclerosis     Major neurocognitive disorder/vascular dementia without behavioral disturbance;     Primary hypertension.  There are no reports of uncontrolled pain. Her weight is stable she is doing well after her room change.   Past Medical History:  Diagnosis Date   Aortic atherosclerosis (HCC) 11/15/2020   Breast cancer (HCC)    Cognitive communication deficit    Dementia (HCC)    Difficulty in walking(719.7)    Fall    HTN (hypertension) 08/26/2016   Muscle weakness (generalized)    Rash and nonspecific skin eruption    Thyroid disease    hypo   Urinary tract infection, site not specified    Vitamin B deficiency     Past Surgical History:  Procedure Laterality Date   COLON SURGERY     IR CATHETER TUBE CHANGE  05/07/2017   IR EXCHANGE BILIARY DRAIN  07/02/2017   IR EXCHANGE BILIARY DRAIN  07/10/2017   IR EXCHANGE BILIARY DRAIN  08/28/2017   IR EXCHANGE BILIARY DRAIN  10/11/2017   IR EXCHANGE BILIARY DRAIN  10/28/2017   IR EXCHANGE BILIARY DRAIN  12/23/2017   IR EXCHANGE BILIARY DRAIN  01/23/2018   IR EXCHANGE BILIARY DRAIN  02/17/2018   IR EXCHANGE BILIARY DRAIN  03/20/2018   IR EXCHANGE BILIARY DRAIN  04/17/2018   IR EXCHANGE BILIARY DRAIN  05/29/2018   IR EXCHANGE BILIARY DRAIN  07/24/2018   IR EXCHANGE BILIARY DRAIN  09/22/2018   IR EXCHANGE BILIARY DRAIN  11/20/2018   IR EXCHANGE BILIARY DRAIN  02/11/2019   IR EXCHANGE BILIARY DRAIN  04/14/2019   IR EXCHANGE BILIARY DRAIN  06/09/2019   IR  EXCHANGE BILIARY DRAIN  11/26/2019   IR EXCHANGE BILIARY DRAIN  12/23/2019   IR EXCHANGE BILIARY DRAIN  02/22/2020   IR EXCHANGE BILIARY DRAIN  05/25/2020   IR EXCHANGE BILIARY DRAIN  07/29/2020   IR EXCHANGE BILIARY DRAIN  08/30/2020   IR EXCHANGE BILIARY DRAIN  10/06/2020   IR EXCHANGE BILIARY DRAIN  11/18/2020   IR EXCHANGE BILIARY DRAIN  12/21/2020   IR EXCHANGE BILIARY DRAIN  02/03/2021   IR EXCHANGE BILIARY DRAIN  03/07/2021   IR EXCHANGE BILIARY DRAIN  05/04/2021   IR EXCHANGE BILIARY DRAIN  06/22/2021   IR EXCHANGE BILIARY DRAIN  07/04/2021   IR EXCHANGE BILIARY DRAIN  08/10/2021   IR PERC CHOLECYSTOSTOMY  01/27/2017   IR PERC CHOLECYSTOSTOMY  11/18/2019   IR RADIOLOGIST EVAL & MGMT  03/05/2017   IR RADIOLOGIST EVAL & MGMT  01/23/2023   IR SINUS/FIST TUBE CHK-NON GI  10/06/2020   MASTECTOMY, PARTIAL Right     Social History   Socioeconomic History   Marital status: Widowed    Spouse name: Not on file   Number of children: Not on file   Years of education: Not on file   Highest education level: Not on file  Occupational History   Occupation: retired  Tobacco  Use   Smoking status: Never   Smokeless tobacco: Never  Vaping Use   Vaping status: Never Used  Substance and Sexual Activity   Alcohol use: No   Drug use: No   Sexual activity: Not Currently  Other Topics Concern   Not on file  Social History Narrative   Long term resident of [facility]    Social Drivers of Health   Financial Resource Strain: Low Risk  (07/21/2018)   Overall Financial Resource Strain (CARDIA)    Difficulty of Paying Living Expenses: Not hard at all  Food Insecurity: No Food Insecurity (07/21/2018)   Hunger Vital Sign    Worried About Running Out of Food in the Last Year: Never true    Ran Out of Food in the Last Year: Never true  Transportation Needs: No Transportation Needs (07/21/2018)   PRAPARE - Administrator, Civil Service (Medical): No    Lack of Transportation (Non-Medical): No  Physical  Activity: Inactive (07/21/2018)   Exercise Vital Sign    Days of Exercise per Week: 0 days    Minutes of Exercise per Session: 0 min  Stress: No Stress Concern Present (07/21/2018)   [employer] of Occupational Health - Occupational Stress Questionnaire    Feeling of Stress : Not at all  Social Connections: Socially Isolated (07/21/2018)   Social Connection and Isolation Panel [NHANES]    Frequency of Communication with Friends and Family: Never    Frequency of Social Gatherings with Friends and Family: Never    Attends Religious Services: Never    Database administrator or Organizations: No    Attends Banker Meetings: Never    Marital Status: Widowed  Intimate Partner Violence: Not At Risk (07/21/2018)   Humiliation, Afraid, Rape, and Kick questionnaire    Fear of Current or Ex-Partner: No    Emotionally Abused: No    Physically Abused: No    Sexually Abused: No   Family History  Problem Relation Age of Onset   Cancer Father        ? primary   Diabetes Neg Hx    Heart disease Neg Hx    Stroke Neg Hx       VITAL SIGNS BP (!) 128/55   Pulse 68   Temp 97.6 F (36.4 C)   Resp 20   Ht 5\' 6"  (1.676 m)   Wt 131 lb 3.2 oz (59.5 kg)   SpO2 98%   BMI 21.18 kg/m   Outpatient Encounter Medications as of 10/07/2023  Medication Sig   acetaminophen (TYLENOL) 325 MG tablet Take 650 mg by mouth every 6 (six) hours as needed for mild pain.   ALPRAZolam (XANAX) 0.5 MG tablet Take 1 tablet (0.5 mg total) by mouth daily as needed for anxiety.   donepezil (ARICEPT) 10 MG tablet Take 1 tablet by mouth at bedtime.   No facility-administered encounter medications on file as of 10/07/2023.     SIGNIFICANT DIAGNOSTIC EXAMS  DIAGNOSTIC EXAMS  PREVIOUS   06-06-22: dexa scan: t score -2.464    LABS REVIEWED: PREVIOUS:    02-18-23: wbc 6.5; hgb 11.2; hct 34.8; mcv 96.4 plt 234; glucose 90; bun 19; creat 0.81; k+ 3.8; na++ 140; ca 8.4 gfr >60; protein 6.8 albumin 2.9      TODAY  08-26-23: wbc 5.3; hgb 10.9; hct 34.0; mcv 96.3 plt 288; glucose 79; bun 13; creat 0.85; k+ 3.9; na++ 138; ca 8.4; gfr>60 protein 6.5 albumin 2.7   Review  of Systems  Constitutional:  Negative for malaise/fatigue.  Respiratory:  Negative for cough and shortness of breath.   Cardiovascular:  Negative for chest pain, palpitations and leg swelling.  Gastrointestinal:  Negative for abdominal pain, constipation and heartburn.  Musculoskeletal:  Negative for back pain, joint pain and myalgias.  Skin: Negative.   Neurological:  Negative for dizziness.  Psychiatric/Behavioral:  The patient is not nervous/anxious.    Physical Exam Constitutional:      General: She is not in acute distress.    Appearance: She is well-developed. She is not diaphoretic.  Neck:     Thyroid: No thyromegaly.  Cardiovascular:     Rate and Rhythm: Normal rate and regular rhythm.     Pulses: Normal pulses.     Heart sounds: Murmur heard.  Pulmonary:     Effort: Pulmonary effort is normal. No respiratory distress.     Breath sounds: Normal breath sounds.  Chest:  Breasts:    Right: Absent.     Comments: Partial right mastectomy Abdominal:     General: Bowel sounds are normal. There is no distension.     Palpations: Abdomen is soft.     Tenderness: There is no abdominal tenderness.  Musculoskeletal:        General: Normal range of motion.     Cervical back: Neck supple.     Right lower leg: No edema.     Left lower leg: No edema.  Lymphadenopathy:     Cervical: No cervical adenopathy.  Skin:    General: Skin is warm and dry.  Neurological:     Mental Status: She is alert. Mental status is at baseline.  Psychiatric:        Mood and Affect: Mood normal.     ASSESSMENT/ PLAN:  TODAY  Aortic atherosclerosis (ct 10-25-18) not on statin due to advanced age.   2. Major neurocognitive disorder/vascular dementia without behavioral disturbance; weight is 131 pounds; on aricept 10 mg daily   3.  Primary hypertension: b/p 128/55  PREVIOUS   4. Calculus of gall bladder without acute cholecystitis and obstruction: her chole drain remains out  5. Chronic constipation: will continue miralax daily as needed  6. Post menopausal osteoporosis t score -2.464 will monitor   7. Herpes without outbreak  8. Protein calorie malnutrition: albumin 3.0 will continue supplements as directed  9. Chronic anemia: hgb 11.9      Synthia Innocent NP [facility]   call [phone]

## 2023-10-25 ENCOUNTER — Other Ambulatory Visit: Payer: Self-pay | Admitting: Adult Health

## 2023-11-07 ENCOUNTER — Other Ambulatory Visit: Payer: Self-pay | Admitting: Adult Health

## 2023-11-13 ENCOUNTER — Non-Acute Institutional Stay (SKILLED_NURSING_FACILITY): Payer: Medicare Other | Admitting: Adult Health

## 2023-11-13 ENCOUNTER — Encounter: Payer: Self-pay | Admitting: Adult Health

## 2023-11-13 DIAGNOSIS — K8061 Calculus of gallbladder and bile duct with cholecystitis, unspecified, with obstruction: Secondary | ICD-10-CM

## 2023-11-13 DIAGNOSIS — K5909 Other constipation: Secondary | ICD-10-CM | POA: Diagnosis not present

## 2023-11-13 DIAGNOSIS — M810 Age-related osteoporosis without current pathological fracture: Secondary | ICD-10-CM

## 2023-11-13 NOTE — Progress Notes (Signed)
Location:  [facility] Room Number: 109 Place of Service:  SNF (31)   CODE STATUS: dnr   No Known Allergies  Chief Complaint  Patient presents with   Medical Management of Chronic Issues            Calculus of gall bladder without acute cholecystitis and obstruction:   Chronic constipation:    Post menopausal osteoporosis     HPI:  She is a 89 year old long term resident of this facility being seen for the management of her chronic illnesses: Calculus of gall bladder without acute cholecystitis and obstruction:   Chronic constipation:    Post menopausal osteoporosis. There are no reports of uncontrolled pain. There are no reports of agitation or anxiety present.   Past Medical History:  Diagnosis Date   Aortic atherosclerosis (HCC) 11/15/2020   Breast cancer (HCC)    Cognitive communication deficit    Dementia (HCC)    Difficulty in walking(719.7)    Fall    HTN (hypertension) 08/26/2016   Muscle weakness (generalized)    Rash and nonspecific skin eruption    Thyroid disease    hypo   Urinary tract infection, site not specified    Vitamin B deficiency     Past Surgical History:  Procedure Laterality Date   COLON SURGERY     IR CATHETER TUBE CHANGE  05/07/2017   IR EXCHANGE BILIARY DRAIN  07/02/2017   IR EXCHANGE BILIARY DRAIN  07/10/2017   IR EXCHANGE BILIARY DRAIN  08/28/2017   IR EXCHANGE BILIARY DRAIN  10/11/2017   IR EXCHANGE BILIARY DRAIN  10/28/2017   IR EXCHANGE BILIARY DRAIN  12/23/2017   IR EXCHANGE BILIARY DRAIN  01/23/2018   IR EXCHANGE BILIARY DRAIN  02/17/2018   IR EXCHANGE BILIARY DRAIN  03/20/2018   IR EXCHANGE BILIARY DRAIN  04/17/2018   IR EXCHANGE BILIARY DRAIN  05/29/2018   IR EXCHANGE BILIARY DRAIN  07/24/2018   IR EXCHANGE BILIARY DRAIN  09/22/2018   IR EXCHANGE BILIARY DRAIN  11/20/2018   IR EXCHANGE BILIARY DRAIN  02/11/2019   IR EXCHANGE BILIARY DRAIN  04/14/2019   IR EXCHANGE BILIARY DRAIN  06/09/2019   IR EXCHANGE BILIARY DRAIN   11/26/2019   IR EXCHANGE BILIARY DRAIN  12/23/2019   IR EXCHANGE BILIARY DRAIN  02/22/2020   IR EXCHANGE BILIARY DRAIN  05/25/2020   IR EXCHANGE BILIARY DRAIN  07/29/2020   IR EXCHANGE BILIARY DRAIN  08/30/2020   IR EXCHANGE BILIARY DRAIN  10/06/2020   IR EXCHANGE BILIARY DRAIN  11/18/2020   IR EXCHANGE BILIARY DRAIN  12/21/2020   IR EXCHANGE BILIARY DRAIN  02/03/2021   IR EXCHANGE BILIARY DRAIN  03/07/2021   IR EXCHANGE BILIARY DRAIN  05/04/2021   IR EXCHANGE BILIARY DRAIN  06/22/2021   IR EXCHANGE BILIARY DRAIN  07/04/2021   IR EXCHANGE BILIARY DRAIN  08/10/2021   IR PERC CHOLECYSTOSTOMY  01/27/2017   IR PERC CHOLECYSTOSTOMY  11/18/2019   IR RADIOLOGIST EVAL & MGMT  03/05/2017   IR RADIOLOGIST EVAL & MGMT  01/23/2023   IR SINUS/FIST TUBE CHK-NON GI  10/06/2020   MASTECTOMY, PARTIAL Right     Social History   Socioeconomic History   Marital status: Widowed    Spouse name: Not on file   Number of children: Not on file   Years of education: Not on file   Highest education level: Not on file  Occupational History   Occupation: retired  Tobacco Use  Smoking status: Never   Smokeless tobacco: Never  Vaping Use   Vaping status: Never Used  Substance and Sexual Activity   Alcohol use: No   Drug use: No   Sexual activity: Not Currently  Other Topics Concern   Not on file  Social History Narrative   Long term resident of [facility]    Social Drivers of Health   Financial Resource Strain: Low Risk  (07/21/2018)   Overall Financial Resource Strain (CARDIA)    Difficulty of Paying Living Expenses: Not hard at all  Food Insecurity: No Food Insecurity (07/21/2018)   Hunger Vital Sign    Worried About Running Out of Food in the Last Year: Never true    Ran Out of Food in the Last Year: Never true  Transportation Needs: No Transportation Needs (07/21/2018)   PRAPARE - Administrator, Civil Service (Medical): No    Lack of Transportation (Non-Medical): No  Physical Activity: Inactive (07/21/2018)    Exercise Vital Sign    Days of Exercise per Week: 0 days    Minutes of Exercise per Session: 0 min  Stress: No Stress Concern Present (07/21/2018)   [employer] of Occupational Health - Occupational Stress Questionnaire    Feeling of Stress : Not at all  Social Connections: Socially Isolated (07/21/2018)   Social Connection and Isolation Panel [NHANES]    Frequency of Communication with Friends and Family: Never    Frequency of Social Gatherings with Friends and Family: Never    Attends Religious Services: Never    Database administrator or Organizations: No    Attends Banker Meetings: Never    Marital Status: Widowed  Intimate Partner Violence: Not At Risk (07/21/2018)   Humiliation, Afraid, Rape, and Kick questionnaire    Fear of Current or Ex-Partner: No    Emotionally Abused: No    Physically Abused: No    Sexually Abused: No   Family History  Problem Relation Age of Onset   Cancer Father        ? primary   Diabetes Neg Hx    Heart disease Neg Hx    Stroke Neg Hx       VITAL SIGNS BP 136/72   Pulse 74   Temp 97.7 F (36.5 C)   Resp (!) 22   Ht 5\' 6"  (1.676 m)   Wt 132 lb (59.9 kg)   SpO2 98%   BMI 21.31 kg/m   Outpatient Encounter Medications as of 11/13/2023  Medication Sig   acetaminophen (TYLENOL) 325 MG tablet Take 650 mg by mouth every 6 (six) hours as needed for mild pain.   ALPRAZolam (XANAX) 0.5 MG tablet Take 1 tablet (0.5 mg total) by mouth daily as needed for anxiety.   donepezil (ARICEPT) 10 MG tablet Take 1 tablet by mouth at bedtime.   No facility-administered encounter medications on file as of 11/13/2023.     SIGNIFICANT DIAGNOSTIC EXAMS  DIAGNOSTIC EXAMS  PREVIOUS   06-06-22: dexa scan: t score -2.464   LABS REVIEWED: PREVIOUS:    02-18-23: wbc 6.5; hgb 11.2; hct 34.8; mcv 96.4 plt 234; glucose 90; bun 19; creat 0.81; k+ 3.8; na++ 140; ca 8.4 gfr >60; protein 6.8 albumin 2.9    08-26-23: wbc 5.3; hgb 10.9; hct 34.0;  mcv 96.3 plt 288; glucose 79; bun 13; creat 0.85; k+ 3.9; na++ 138; ca 8.4; gfr>60 protein 6.5 albumin 2.7   NO NEW LABS.   Review of Systems  Constitutional:  Negative for malaise/fatigue.  Respiratory:  Negative for cough and shortness of breath.   Cardiovascular:  Negative for chest pain, palpitations and leg swelling.  Gastrointestinal:  Negative for abdominal pain, constipation and heartburn.  Musculoskeletal:  Negative for back pain, joint pain and myalgias.  Skin: Negative.   Neurological:  Negative for dizziness.  Psychiatric/Behavioral:  The patient is not nervous/anxious.    Physical Exam Constitutional:      General: She is not in acute distress.    Appearance: She is well-developed. She is not diaphoretic.  Neck:     Thyroid: No thyromegaly.  Cardiovascular:     Rate and Rhythm: Normal rate and regular rhythm.     Pulses: Normal pulses.     Heart sounds: Murmur heard.  Pulmonary:     Effort: Pulmonary effort is normal. No respiratory distress.     Breath sounds: Normal breath sounds.  Chest:  Breasts:    Right: Absent.     Comments: Partial right mastectomy  Abdominal:     General: Bowel sounds are normal. There is no distension.     Palpations: Abdomen is soft.     Tenderness: There is no abdominal tenderness.  Musculoskeletal:        General: Normal range of motion.     Cervical back: Neck supple.     Right lower leg: No edema.     Left lower leg: No edema.  Lymphadenopathy:     Cervical: No cervical adenopathy.  Skin:    General: Skin is warm and dry.  Neurological:     Mental Status: She is alert. Mental status is at baseline.  Psychiatric:        Mood and Affect: Mood normal.      ASSESSMENT/ PLAN:  TODAY  Calculus of gall bladder without acute cholecystitis and obstruction: her chole drain remains out. Is voicing no complaints.   2. Chronic constipation: will continue miralax daily as needed  3. Post menopausal osteoporosis: t score -2.646    PREVIOUS   4. Herpes without outbreak  5. Protein calorie malnutrition: albumin 3.0 will continue supplements as directed  6. Chronic anemia: hgb 11.9  7. Aortic atherosclerosis (ct 10-25-18) not on statin due to advanced age.   8. Major neurocognitive disorder/vascular dementia without behavioral disturbance; weight is 1312 pounds; on aricept 10 mg daily   9. Primary hypertension: b/p 136/72   Synthia Innocent NP [facility]  call [phone]

## 2023-11-25 ENCOUNTER — Encounter: Payer: Self-pay | Admitting: Internal Medicine

## 2023-11-25 ENCOUNTER — Non-Acute Institutional Stay (SKILLED_NURSING_FACILITY): Payer: Medicare Other | Admitting: Internal Medicine

## 2023-11-25 DIAGNOSIS — E034 Atrophy of thyroid (acquired): Secondary | ICD-10-CM | POA: Diagnosis not present

## 2023-11-25 DIAGNOSIS — E441 Mild protein-calorie malnutrition: Secondary | ICD-10-CM

## 2023-11-25 DIAGNOSIS — I10 Essential (primary) hypertension: Secondary | ICD-10-CM

## 2023-11-25 DIAGNOSIS — D649 Anemia, unspecified: Secondary | ICD-10-CM

## 2023-11-25 DIAGNOSIS — F01C0 Vascular dementia, severe, without behavioral disturbance, psychotic disturbance, mood disturbance, and anxiety: Secondary | ICD-10-CM | POA: Diagnosis not present

## 2023-11-25 NOTE — Progress Notes (Unsigned)
NURSING HOME LOCATION:  [facility] ROOM NUMBER:  110 D  CODE STATUS:  DNR  PCP:  Synthia Innocent NP  This is a nursing facility follow up visit of chronic medical issues & to document compliance with Regulation 483.30 (c) in The Long Term Care Survey Manual Phase 2 which mandates caregiver visit ( visits can alternate among physician, PA or NP as per statutes) within 10 days of 30 days / 60 days/ 90 days post admission to SNF date    Interim medical record and care since last SNF visit was updated with review of diagnostic studies and change in clinical status since last visit were documented.  HPI: She is a permanent resident this facility with medical diagnoses of history of vitamin D deficiency, thyroid dysfunction, essential hypertension, history of breast cancer, dementia, and aortic atherosclerosis.  Her medical history has been dominated by gallbladder disease for which biliary drainage has been necessary on a chronic basis.  She is also had a partial mastectomy on the right.  Labs are sufficiently current as of 08/26/2023.  Stable hypocalcemia was present with a value of 8.4.  There has been some progression of protein/caloric malnutrition with albumin dropping from 2.9-2.7.  Total protein dropped from low normal of 6.8 down to 6.5.  Despite her history of gallbladder issues; AST was low normal at 14 and ALT low normal at 10.  Alk phos was stable at 56. TSH was therapeutic at 3.333. There also was  progression of anemia with H/H dropping from 11.2/34.8 down to 10.9/34.0.  Indices were normochromic normocytic despite history of B12 deficiency.  The last recorded B12 level was 636 on 07/30/2022.  B12 level has been therapeutic dating back to 2018.   Review of systems: Dementia invalidated responses.  As is always the case; she was reluctant to be interviewed or examined.  I did remove my mask as she has exhibited paranoia in the past. I informed her about the decreased  albumin and protein.  She expressed no concerns.  She states that she eats "only what I like." I also asked her about any bleeding dyscrasias in view of the progressive anemia.  Her responses to all my queries were multiple "no, no, no."  Despite her history of gallbladder issues; she denies any GI issues.  Physical exam:  Pertinent or positive findings: It was almost 3:00 and she was in her bed asleep.  She was lying in the right lateral decubitus position.  She was exhibiting hypopnea without snoring or apnea.  During the interview she essentially did not change her position or interact more than to look at me.  The left lacrimal gland is prominent.  Lower lids are puffy.  Intraoral visualization was limited; but there is dental malalignment.  Initially when I attempted to auscultate the chest she jumped as if in pain, crying out"ouch!" Heart  & breath sounds are distant.  Abdomen slightly protuberant.  Pedal pulses cannot be palpated.  Limb atrophy is present.  Interosseous wasting is noted.  General appearance: no acute distress, increased work of breathing is present.   Lymphatic: No lymphadenopathy about the head, neck, axilla. Eyes: No conjunctival inflammation or lid edema is present. There is no scleral icterus. Ears:  External ear exam shows no significant lesions or deformities.   Nose:  External nasal examination shows no deformity or inflammation. Nasal mucosa are pink and moist without lesions, exudates Neck:  No thyromegaly, masses, tenderness noted.    Heart:  No gallop, murmur, click, rub .  Lungs: without wheezes, rhonchi, rales, rubs. Abdomen: Bowel sounds are normal. Abdomen is soft and nontender with no organomegaly, hernias, masses. GU: Deferred  Extremities:  No cyanosis, clubbing, edema  Neurologic exam :Balance, Rhomberg, finger to nose testing could not be completed due to clinical state Skin: Warm & dry w/o tenting. No significant lesions or rash.  See summary under  each active problem in the Problem List with associated updated therapeutic plan

## 2023-11-25 NOTE — Patient Instructions (Signed)
 See assessment and plan under each diagnosis in the problem list and acutely for this visit

## 2023-11-25 NOTE — Assessment & Plan Note (Signed)
As noted there is been a decrease in both albumin and total protein.  Limb atrophy and interosseous wasting are present.  Nutritionist continues to monitor.

## 2023-11-25 NOTE — Assessment & Plan Note (Signed)
BP controlled; without antihypertensive medications.  Continue to monitor.

## 2023-11-25 NOTE — Assessment & Plan Note (Signed)
08/26/2023 TSH without L-thyroxine supplementation.  Continue annual monitor.

## 2023-11-25 NOTE — Assessment & Plan Note (Signed)
Usually she will truncate the interview and exam as she gets very agitated.  Today I removed my mask and began by discussing the decrease in albumin and total protein explaining what this might mean.  I also discussed the anemia is related to inquire about bleeding dyscrasias.  This did facilitate a more complete interview and exam is typically the case. She seemed to indicate that she might take a protein supplement.

## 2023-11-25 NOTE — Assessment & Plan Note (Signed)
Staff is not reporting bleeding dyscrasias.  CBC can be updated and FOB and B12 level checked if anemia progresses.

## 2023-12-13 ENCOUNTER — Non-Acute Institutional Stay (SKILLED_NURSING_FACILITY): Payer: Medicare Other | Admitting: Adult Health

## 2023-12-13 ENCOUNTER — Encounter: Payer: Self-pay | Admitting: Adult Health

## 2023-12-13 DIAGNOSIS — N182 Chronic kidney disease, stage 2 (mild): Secondary | ICD-10-CM

## 2023-12-13 DIAGNOSIS — I700 Atherosclerosis of aorta: Secondary | ICD-10-CM | POA: Diagnosis not present

## 2023-12-13 DIAGNOSIS — F0390 Unspecified dementia without behavioral disturbance: Secondary | ICD-10-CM | POA: Diagnosis not present

## 2023-12-13 NOTE — Progress Notes (Signed)
Location:  [facility] Room Number: 110 D Place of Service:  SNF (31)  Cynthia [name] S,NP CODE STATUS: DNR  No Known Allergies  Chief Complaint  Patient presents with   Acute Visit    Care plan meeting    HPI:  We have come together for her care plan meeting. BIMS 5/15 mood 0/30. Uses wheelchair without falls. She requires independent assist with her adl care. She is occasionally incontinent of bladder and bowel. Dietary: feeds self; regular diet weight is 132 pounds. No therapy at this time. She continues to be followed for her chronic illnesses including: Aortic atherosclerosis   Major neurocognitive disorder   Stage 2 chronic kidney disease  Past Medical History:  Diagnosis Date   Aortic atherosclerosis (HCC) 11/15/2020   Breast cancer (HCC)    Cognitive communication deficit    Dementia (HCC)    Difficulty in walking(719.7)    Fall    HTN (hypertension) 08/26/2016   Muscle weakness (generalized)    Rash and nonspecific skin eruption    Thyroid disease    hypo   Urinary tract infection, site not specified    Vitamin B deficiency     Past Surgical History:  Procedure Laterality Date   COLON SURGERY     IR CATHETER TUBE CHANGE  05/07/2017   IR EXCHANGE BILIARY DRAIN  07/02/2017   IR EXCHANGE BILIARY DRAIN  07/10/2017   IR EXCHANGE BILIARY DRAIN  08/28/2017   IR EXCHANGE BILIARY DRAIN  10/11/2017   IR EXCHANGE BILIARY DRAIN  10/28/2017   IR EXCHANGE BILIARY DRAIN  12/23/2017   IR EXCHANGE BILIARY DRAIN  01/23/2018   IR EXCHANGE BILIARY DRAIN  02/17/2018   IR EXCHANGE BILIARY DRAIN  03/20/2018   IR EXCHANGE BILIARY DRAIN  04/17/2018   IR EXCHANGE BILIARY DRAIN  05/29/2018   IR EXCHANGE BILIARY DRAIN  07/24/2018   IR EXCHANGE BILIARY DRAIN  09/22/2018   IR EXCHANGE BILIARY DRAIN  11/20/2018   IR EXCHANGE BILIARY DRAIN  02/11/2019   IR EXCHANGE BILIARY DRAIN  04/14/2019   IR EXCHANGE BILIARY DRAIN  06/09/2019   IR EXCHANGE BILIARY DRAIN  11/26/2019   IR EXCHANGE  BILIARY DRAIN  12/23/2019   IR EXCHANGE BILIARY DRAIN  02/22/2020   IR EXCHANGE BILIARY DRAIN  05/25/2020   IR EXCHANGE BILIARY DRAIN  07/29/2020   IR EXCHANGE BILIARY DRAIN  08/30/2020   IR EXCHANGE BILIARY DRAIN  10/06/2020   IR EXCHANGE BILIARY DRAIN  11/18/2020   IR EXCHANGE BILIARY DRAIN  12/21/2020   IR EXCHANGE BILIARY DRAIN  02/03/2021   IR EXCHANGE BILIARY DRAIN  03/07/2021   IR EXCHANGE BILIARY DRAIN  05/04/2021   IR EXCHANGE BILIARY DRAIN  06/22/2021   IR EXCHANGE BILIARY DRAIN  07/04/2021   IR EXCHANGE BILIARY DRAIN  08/10/2021   IR PERC CHOLECYSTOSTOMY  01/27/2017   IR PERC CHOLECYSTOSTOMY  11/18/2019   IR RADIOLOGIST EVAL & MGMT  03/05/2017   IR RADIOLOGIST EVAL & MGMT  01/23/2023   IR SINUS/FIST TUBE CHK-NON GI  10/06/2020   MASTECTOMY, PARTIAL Right     Social History   Socioeconomic History   Marital status: Widowed    Spouse name: Not on file   Number of children: Not on file   Years of education: Not on file   Highest education level: Not on file  Occupational History   Occupation: retired  Tobacco Use   Smoking status: Never   Smokeless tobacco: Never  Vaping Use  Vaping status: Never Used  Substance and Sexual Activity   Alcohol use: No   Drug use: No   Sexual activity: Not Currently  Other Topics Concern   Not on file  Social History Narrative   Long term resident of [facility]    Social Drivers of Health   Financial Resource Strain: Low Risk  (07/21/2018)   Overall Financial Resource Strain (CARDIA)    Difficulty of Paying Living Expenses: Not hard at all  Food Insecurity: No Food Insecurity (07/21/2018)   Hunger Vital Sign    Worried About Running Out of Food in the Last Year: Never true    Ran Out of Food in the Last Year: Never true  Transportation Needs: No Transportation Needs (07/21/2018)   PRAPARE - Administrator, Civil Service (Medical): No    Lack of Transportation (Non-Medical): No  Physical Activity: Inactive (07/21/2018)   Exercise Vital Sign     Days of Exercise per Week: 0 days    Minutes of Exercise per Session: 0 min  Stress: No Stress Concern Present (07/21/2018)   [employer] of Occupational Health - Occupational Stress Questionnaire    Feeling of Stress : Not at all  Social Connections: Socially Isolated (07/21/2018)   Social Connection and Isolation Panel [NHANES]    Frequency of Communication with Friends and Family: Never    Frequency of Social Gatherings with Friends and Family: Never    Attends Religious Services: Never    Database administrator or Organizations: No    Attends Banker Meetings: Never    Marital Status: Widowed  Intimate Partner Violence: Not At Risk (07/21/2018)   Humiliation, Afraid, Rape, and Kick questionnaire    Fear of Current or Ex-Partner: No    Emotionally Abused: No    Physically Abused: No    Sexually Abused: No   Family History  Problem Relation Age of Onset   Cancer Father        ? primary   Diabetes Neg Hx    Heart disease Neg Hx    Stroke Neg Hx       VITAL SIGNS BP (!) 146/70   Pulse 74   Temp 98.1 F (36.7 C)   Resp (!) 22   Ht 5\' 6"  (1.676 m)   Wt 132 lb 6.4 oz (60.1 kg)   SpO2 97%   BMI 21.37 kg/m   Outpatient Encounter Medications as of 12/13/2023  Medication Sig   acetaminophen (TYLENOL) 325 MG tablet Take 650 mg by mouth every 6 (six) hours as needed for mild pain.   ALPRAZolam (XANAX) 0.5 MG tablet Take 1 tablet (0.5 mg total) by mouth daily as needed for anxiety.   donepezil (ARICEPT) 10 MG tablet Take 1 tablet by mouth at bedtime.   No facility-administered encounter medications on file as of 12/13/2023.     SIGNIFICANT DIAGNOSTIC EXAMS  DIAGNOSTIC EXAMS  PREVIOUS   06-06-22: dexa scan: t score -2.464   LABS REVIEWED: PREVIOUS:    02-18-23: wbc 6.5; hgb 11.2; hct 34.8; mcv 96.4 plt 234; glucose 90; bun 19; creat 0.81; k+ 3.8; na++ 140; ca 8.4 gfr >60; protein 6.8 albumin 2.9    08-26-23: wbc 5.3; hgb 10.9; hct 34.0; mcv 96.3 plt  288; glucose 79; bun 13; creat 0.85; k+ 3.9; na++ 138; ca 8.4; gfr>60 protein 6.5 albumin 2.7   NO NEW LABS.   Review of Systems  Constitutional:  Negative for malaise/fatigue.  Respiratory:  Negative for cough  and shortness of breath.   Cardiovascular:  Negative for chest pain, palpitations and leg swelling.  Gastrointestinal:  Negative for abdominal pain, constipation and heartburn.  Musculoskeletal:  Negative for back pain, joint pain and myalgias.  Skin: Negative.   Neurological:  Negative for dizziness.  Psychiatric/Behavioral:  The patient is not nervous/anxious.    Physical Exam Constitutional:      General: She is not in acute distress.    Appearance: She is well-developed. She is not diaphoretic.  Neck:     Thyroid: No thyromegaly.  Cardiovascular:     Rate and Rhythm: Normal rate and regular rhythm.     Heart sounds: Normal heart sounds.  Pulmonary:     Effort: Pulmonary effort is normal. No respiratory distress.     Breath sounds: Normal breath sounds.  Abdominal:     General: Bowel sounds are normal. There is no distension.     Palpations: Abdomen is soft.     Tenderness: There is no abdominal tenderness.  Musculoskeletal:        General: Normal range of motion.     Cervical back: Neck supple.     Right lower leg: No edema.     Left lower leg: No edema.  Lymphadenopathy:     Cervical: No cervical adenopathy.  Skin:    General: Skin is warm and dry.  Neurological:     Mental Status: She is alert. Mental status is at baseline.  Psychiatric:        Mood and Affect: Mood normal.      ASSESSMENT/ PLAN:  TODAY  Aortic atherosclerosis Major neurocognitive disorder Stage 2 chronic kidney disease  Will continue current medications Will continue current plan of care Will continue to monitor her status  Time spent with patient: 40 minutes: medications dietary; plan of care.    Synthia Innocent NP [facility]   call [phone]

## 2023-12-30 ENCOUNTER — Non-Acute Institutional Stay (SKILLED_NURSING_FACILITY): Admitting: Adult Health

## 2023-12-30 DIAGNOSIS — D649 Anemia, unspecified: Secondary | ICD-10-CM | POA: Diagnosis not present

## 2023-12-30 DIAGNOSIS — E441 Mild protein-calorie malnutrition: Secondary | ICD-10-CM | POA: Diagnosis not present

## 2023-12-30 DIAGNOSIS — B009 Herpesviral infection, unspecified: Secondary | ICD-10-CM

## 2023-12-31 ENCOUNTER — Encounter: Payer: Self-pay | Admitting: Adult Health

## 2023-12-31 NOTE — Progress Notes (Unsigned)
 Location:  [facility] Room Number: 110 Place of Service:  SNF (31)   CODE STATUS: dnr   No Known Allergies  Chief Complaint  Patient presents with   Medical Management of Chronic Issues      Herpes: Protein calorie malnutrition;  Chronic anemia:     HPI:  She is a 89 year old long term resident of this facility being seen for the management of her chronic illnesses: Herpes: Protein calorie malnutrition;  Chronic anemia. There are no reports of uncontrolled pain. Her weight is stable. No reports of anxiety present.   Past Medical History:  Diagnosis Date   Aortic atherosclerosis (HCC) 11/15/2020   Breast cancer (HCC)    Cognitive communication deficit    Dementia (HCC)    Difficulty in walking(719.7)    Fall    HTN (hypertension) 08/26/2016   Muscle weakness (generalized)    Rash and nonspecific skin eruption    Thyroid disease    hypo   Urinary tract infection, site not specified    Vitamin B deficiency     Past Surgical History:  Procedure Laterality Date   COLON SURGERY     IR CATHETER TUBE CHANGE  05/07/2017   IR EXCHANGE BILIARY DRAIN  07/02/2017   IR EXCHANGE BILIARY DRAIN  07/10/2017   IR EXCHANGE BILIARY DRAIN  08/28/2017   IR EXCHANGE BILIARY DRAIN  10/11/2017   IR EXCHANGE BILIARY DRAIN  10/28/2017   IR EXCHANGE BILIARY DRAIN  12/23/2017   IR EXCHANGE BILIARY DRAIN  01/23/2018   IR EXCHANGE BILIARY DRAIN  02/17/2018   IR EXCHANGE BILIARY DRAIN  03/20/2018   IR EXCHANGE BILIARY DRAIN  04/17/2018   IR EXCHANGE BILIARY DRAIN  05/29/2018   IR EXCHANGE BILIARY DRAIN  07/24/2018   IR EXCHANGE BILIARY DRAIN  09/22/2018   IR EXCHANGE BILIARY DRAIN  11/20/2018   IR EXCHANGE BILIARY DRAIN  02/11/2019   IR EXCHANGE BILIARY DRAIN  04/14/2019   IR EXCHANGE BILIARY DRAIN  06/09/2019   IR EXCHANGE BILIARY DRAIN  11/26/2019   IR EXCHANGE BILIARY DRAIN  12/23/2019   IR EXCHANGE BILIARY DRAIN  02/22/2020   IR EXCHANGE BILIARY DRAIN  05/25/2020   IR EXCHANGE BILIARY  DRAIN  07/29/2020   IR EXCHANGE BILIARY DRAIN  08/30/2020   IR EXCHANGE BILIARY DRAIN  10/06/2020   IR EXCHANGE BILIARY DRAIN  11/18/2020   IR EXCHANGE BILIARY DRAIN  12/21/2020   IR EXCHANGE BILIARY DRAIN  02/03/2021   IR EXCHANGE BILIARY DRAIN  03/07/2021   IR EXCHANGE BILIARY DRAIN  05/04/2021   IR EXCHANGE BILIARY DRAIN  06/22/2021   IR EXCHANGE BILIARY DRAIN  07/04/2021   IR EXCHANGE BILIARY DRAIN  08/10/2021   IR PERC CHOLECYSTOSTOMY  01/27/2017   IR PERC CHOLECYSTOSTOMY  11/18/2019   IR RADIOLOGIST EVAL & MGMT  03/05/2017   IR RADIOLOGIST EVAL & MGMT  01/23/2023   IR SINUS/FIST TUBE CHK-NON GI  10/06/2020   MASTECTOMY, PARTIAL Right     Social History   Socioeconomic History   Marital status: Widowed    Spouse name: Not on file   Number of children: Not on file   Years of education: Not on file   Highest education level: Not on file  Occupational History   Occupation: retired  Tobacco Use   Smoking status: Never   Smokeless tobacco: Never  Vaping Use   Vaping status: Never Used  Substance and Sexual Activity   Alcohol use: No  Drug use: No   Sexual activity: Not Currently  Other Topics Concern   Not on file  Social History Narrative   Long term resident of [facility]    Social Drivers of Health   Financial Resource Strain: Low Risk  (07/21/2018)   Overall Financial Resource Strain (CARDIA)    Difficulty of Paying Living Expenses: Not hard at all  Food Insecurity: No Food Insecurity (07/21/2018)   Hunger Vital Sign    Worried About Running Out of Food in the Last Year: Never true    Ran Out of Food in the Last Year: Never true  Transportation Needs: No Transportation Needs (07/21/2018)   PRAPARE - Administrator, Civil Service (Medical): No    Lack of Transportation (Non-Medical): No  Physical Activity: Inactive (07/21/2018)   Exercise Vital Sign    Days of Exercise per Week: 0 days    Minutes of Exercise per Session: 0 min  Stress: No Stress Concern Present  (07/21/2018)   [employer] of Occupational Health - Occupational Stress Questionnaire    Feeling of Stress : Not at all  Social Connections: Socially Isolated (07/21/2018)   Social Connection and Isolation Panel [NHANES]    Frequency of Communication with Friends and Family: Never    Frequency of Social Gatherings with Friends and Family: Never    Attends Religious Services: Never    Database administrator or Organizations: No    Attends Banker Meetings: Never    Marital Status: Widowed  Intimate Partner Violence: Not At Risk (07/21/2018)   Humiliation, Afraid, Rape, and Kick questionnaire    Fear of Current or Ex-Partner: No    Emotionally Abused: No    Physically Abused: No    Sexually Abused: No   Family History  Problem Relation Age of Onset   Cancer Father        ? primary   Diabetes Neg Hx    Heart disease Neg Hx    Stroke Neg Hx       VITAL SIGNS BP (!) 140/70   Pulse 74   Temp 98.2 F (36.8 C)   Resp 20   Ht 5\' 6"  (1.676 m)   Wt 131 lb 12.8 oz (59.8 kg)   SpO2 98%   BMI 21.27 kg/m   Outpatient Encounter Medications as of 12/30/2023  Medication Sig   acetaminophen (TYLENOL) 325 MG tablet Take 650 mg by mouth every 6 (six) hours as needed for mild pain.   ALPRAZolam (XANAX) 0.5 MG tablet Take 1 tablet (0.5 mg total) by mouth daily as needed for anxiety.   donepezil (ARICEPT) 10 MG tablet Take 1 tablet by mouth at bedtime.   No facility-administered encounter medications on file as of 12/30/2023.     SIGNIFICANT DIAGNOSTIC EXAMS  LABS REVIEWED: PREVIOUS:    02-18-23: wbc 6.5; hgb 11.2; hct 34.8; mcv 96.4 plt 234; glucose 90; bun 19; creat 0.81; k+ 3.8; na++ 140; ca 8.4 gfr >60; protein 6.8 albumin 2.9    08-26-23: wbc 5.3; hgb 10.9; hct 34.0; mcv 96.3 plt 288; glucose 79; bun 13; creat 0.85; k+ 3.9; na++ 138; ca 8.4; gfr>60 protein 6.5 albumin 2.7   NO NEW LABS.   Review of Systems  Constitutional:  Negative for malaise/fatigue.   Respiratory:  Negative for cough and shortness of breath.   Cardiovascular:  Negative for chest pain, palpitations and leg swelling.  Gastrointestinal:  Negative for abdominal pain, constipation and heartburn.  Musculoskeletal:  Negative for  back pain, joint pain and myalgias.  Skin: Negative.   Neurological:  Negative for dizziness.  Psychiatric/Behavioral:  The patient is not nervous/anxious.    Physical Exam Constitutional:      General: She is not in acute distress.    Appearance: She is well-developed. She is not diaphoretic.  Neck:     Thyroid: No thyromegaly.  Cardiovascular:     Rate and Rhythm: Normal rate and regular rhythm.     Pulses: Normal pulses.     Heart sounds: Murmur heard.  Pulmonary:     Effort: Pulmonary effort is normal. No respiratory distress.     Breath sounds: Normal breath sounds.  Chest:  Breasts:    Right: Absent.     Comments: Partial right mastectomy Abdominal:     General: Bowel sounds are normal. There is no distension.     Palpations: Abdomen is soft.     Tenderness: There is no abdominal tenderness.     Comments: Drain site without signs of infection present   Musculoskeletal:        General: Normal range of motion.     Cervical back: Neck supple.     Right lower leg: No edema.     Left lower leg: No edema.  Lymphadenopathy:     Cervical: No cervical adenopathy.  Skin:    General: Skin is warm and dry.  Neurological:     Mental Status: She is alert. Mental status is at baseline.  Psychiatric:        Mood and Affect: Mood normal.      ASSESSMENT/ PLAN:  TODAY  Herpes: without outbreak  2. Protein calorie malnutrition; albumin 3.0; will continue supplements as directed  3. Chronic anemia: hgb 11.9   PREVIOUS   4. Aortic atherosclerosis (ct 10-25-18) not on statin due to advanced age.   5. Major neurocognitive disorder/vascular dementia without behavioral disturbance; weight is 131 pounds; on aricept 10 mg daily   6.  Primary hypertension: b/p /140/70  7. Calculus of gall bladder without acute cholecystitis and obstruction: her chole drain remains out. Is voicing no complaints.   8. Chronic constipation: will continue miralax daily as needed  9. Post menopausal osteoporosis: t score -2.646    Synthia Innocent NP [facility]  call [phone]

## 2024-01-12 ENCOUNTER — Encounter (HOSPITAL_COMMUNITY)
Admission: RE | Admit: 2024-01-12 | Discharge: 2024-01-12 | Disposition: A | Discharge status: Home / Self Care | Source: Skilled Nursing Facility | Attending: Adult Health | Admitting: Adult Health

## 2024-01-12 DIAGNOSIS — B009 Herpesviral infection, unspecified: Secondary | ICD-10-CM | POA: Diagnosis present

## 2024-01-12 DIAGNOSIS — J849 Interstitial pulmonary disease, unspecified: Secondary | ICD-10-CM | POA: Diagnosis not present

## 2024-01-12 DIAGNOSIS — J111 Influenza due to unidentified influenza virus with other respiratory manifestations: Secondary | ICD-10-CM | POA: Insufficient documentation

## 2024-01-12 LAB — RESP PANEL BY RT-PCR (RSV, FLU A&B, COVID)  RVPGX2
Influenza A by PCR: POSITIVE — AB
Influenza B by PCR: NEGATIVE
Resp Syncytial Virus by PCR: NEGATIVE
SARS Coronavirus 2 by RT PCR: NEGATIVE

## 2024-01-13 ENCOUNTER — Non-Acute Institutional Stay (SKILLED_NURSING_FACILITY): Admitting: Internal Medicine

## 2024-01-13 ENCOUNTER — Encounter: Payer: Self-pay | Admitting: Internal Medicine

## 2024-01-13 DIAGNOSIS — J101 Influenza due to other identified influenza virus with other respiratory manifestations: Secondary | ICD-10-CM

## 2024-01-13 DIAGNOSIS — F01C0 Vascular dementia, severe, without behavioral disturbance, psychotic disturbance, mood disturbance, and anxiety: Secondary | ICD-10-CM

## 2024-01-13 NOTE — Patient Instructions (Signed)
 See assessment and plan under each diagnosis in the problem list and acutely for this visit

## 2024-01-13 NOTE — Progress Notes (Unsigned)
   NURSING HOME LOCATION:  [facility] ROOM NUMBER:  [id number]  CODE STATUS:  DNR  PCP: Sharee Holster, NP   This is a nursing facility follow up visit for specific acute issue of influenza A positivity on screening 3/23.  Interim medical record and care since last SNF visit was updated with review of diagnostic studies and change in clinical status since last visit were documented.  HPI: On 3/23 the patient was noted to be congested with wheezing and cough.  Respiratory screening panel was negative except for influenza A.  In the context of her dementia; she has refused to have any blood work drawn.  She did have a chest x-ray 3/23 which I personally reviewed.  It revealed good inspiration.  There was a rectangular thoracic configuration suggesting COPD.  There is diffuse increase in interstitial markings especially at the bases medially.  There was no definite infiltrate. As noted she declined to have any blood work done.  The most recent labs were performed 11//2024.  This revealed minimal progression of normochromic, normocytic anemia with H/H of 10.9/34.0.  White count was normal at 5300.  Creatinine was 0.85 with a GFR greater than 60.  Review of systems: Dementia invalidated responses.  She denies any upper or lower respiratory tract infection symptoms despite repeatedly clearing her throat.  Constitutional: No fever, significant weight change, fatigue  Eyes: No redness, discharge, pain, vision change ENT/mouth: No nasal congestion,  purulent discharge, earache, change in hearing, sore throat  Cardiovascular: No chest pain, palpitations, paroxysmal nocturnal dyspnea, claudication, edema  Respiratory: No cough, sputum production, hemoptysis, DOE   Gastrointestinal: No heartburn, dysphagia, abdominal pain, nausea /vomiting, rectal bleeding, melena, change in bowels Genitourinary: No dysuria, hematuria, pyuria, incontinence, nocturia Musculoskeletal: No joint stiffness,  joint swelling, weakness, pain Dermatologic: No rash, pruritus, change in appearance of skin Hematologic/lymphatic: No significant bruising, lymphadenopathy, abnormal bleeding Allergy/immunology: No itchy/watery eyes, significant sneezing, urticaria, angioedema  Physical exam:  Pertinent or positive findings: Hair is disheveled.  She did cooperate with the exam which is unusual.  There is no oropharyngeal erythema or exudate.  She has no cervical or axillary lymphadenopathy.  She has minor rales at the bases.  Heart sounds are distant.  Interosseous wasting is present.  General appearance: no acute distress, increased work of breathing is present.   Lymphatic: No lymphadenopathy about the head, neck, axilla. Eyes: No conjunctival inflammation or lid edema is present. There is no scleral icterus. Ears:  External ear exam shows no significant lesions or deformities.   Nose:  External nasal examination shows no deformity or inflammation. Nasal mucosa are pink and moist without lesions, exudates Neck:  No thyromegaly, masses, tenderness noted.    Heart:  No gallop, murmur, click, rub .  Lungs:  without wheezes, rhonchi, rubs. Abdomen: Bowel sounds are normal. Abdomen is soft and nontender with no organomegaly, hernias, masses. GU: Deferred  Extremities:  No cyanosis, clubbing, edema  Skin: Warm & dry w/o tenting. No significant lesions or rash.  See summary under each active problem in the Problem List with associated updated therapeutic plan

## 2024-01-13 NOTE — Assessment & Plan Note (Signed)
 Today she was unusually compliant with my request to perform the interview and exam; but she continued to exhibit sarcasm in her replies which is for typical pattern.

## 2024-02-03 ENCOUNTER — Non-Acute Institutional Stay (SKILLED_NURSING_FACILITY): Admitting: Adult Health

## 2024-02-03 ENCOUNTER — Encounter: Payer: Self-pay | Admitting: Adult Health

## 2024-02-03 DIAGNOSIS — F01C0 Vascular dementia, severe, without behavioral disturbance, psychotic disturbance, mood disturbance, and anxiety: Secondary | ICD-10-CM | POA: Diagnosis not present

## 2024-02-03 DIAGNOSIS — I700 Atherosclerosis of aorta: Secondary | ICD-10-CM

## 2024-02-03 DIAGNOSIS — F0390 Unspecified dementia without behavioral disturbance: Secondary | ICD-10-CM

## 2024-02-03 NOTE — Progress Notes (Unsigned)
 Location:  [facility] Room Number: [id number] Place of Service:  SNF (31)   CODE STATUS: dnr   No Known Allergies  Chief Complaint  Patient presents with   Medical Management of Chronic Issues       Aortic atherosclerosis Major neurocognitive disorder  Vascular dementia, severe, without behavioral disturbance, psychotic disturbance, mood disturbance, and anxiety       HPI:  She is a 89 y.o. long term resident of this facility being seen for the management of her chronic illnesses: Aortic atherosclerosis Major neurocognitive disorder  Vascular dementia, severe, without behavioral disturbance, psychotic disturbance, mood disturbance, and anxiety. There are no reports of uncontrolled pain. No reports of anxiety or depressive thoughts. She is tolerating the lower dose of aricept without difficulty.   Past Medical History:  Diagnosis Date   Aortic atherosclerosis (HCC) 11/15/2020   Breast cancer (HCC)    Cognitive communication deficit    Dementia (HCC)    Difficulty in walking(719.7)    Fall    HTN (hypertension) 08/26/2016   Muscle weakness (generalized)    Rash and nonspecific skin eruption    Thyroid disease    hypo   Urinary tract infection, site not specified    Vitamin B deficiency     Past Surgical History:  Procedure Laterality Date   COLON SURGERY     IR CATHETER TUBE CHANGE  05/07/2017   IR EXCHANGE BILIARY DRAIN  07/02/2017   IR EXCHANGE BILIARY DRAIN  07/10/2017   IR EXCHANGE BILIARY DRAIN  08/28/2017   IR EXCHANGE BILIARY DRAIN  10/11/2017   IR EXCHANGE BILIARY DRAIN  10/28/2017   IR EXCHANGE BILIARY DRAIN  12/23/2017   IR EXCHANGE BILIARY DRAIN  01/23/2018   IR EXCHANGE BILIARY DRAIN  02/17/2018   IR EXCHANGE BILIARY DRAIN  03/20/2018   IR EXCHANGE BILIARY DRAIN  04/17/2018   IR EXCHANGE BILIARY DRAIN  05/29/2018   IR EXCHANGE BILIARY DRAIN  07/24/2018   IR EXCHANGE BILIARY DRAIN  09/22/2018   IR EXCHANGE BILIARY DRAIN  11/20/2018   IR EXCHANGE BILIARY  DRAIN  02/11/2019   IR EXCHANGE BILIARY DRAIN  04/14/2019   IR EXCHANGE BILIARY DRAIN  06/09/2019   IR EXCHANGE BILIARY DRAIN  11/26/2019   IR EXCHANGE BILIARY DRAIN  12/23/2019   IR EXCHANGE BILIARY DRAIN  02/22/2020   IR EXCHANGE BILIARY DRAIN  05/25/2020   IR EXCHANGE BILIARY DRAIN  07/29/2020   IR EXCHANGE BILIARY DRAIN  08/30/2020   IR EXCHANGE BILIARY DRAIN  10/06/2020   IR EXCHANGE BILIARY DRAIN  11/18/2020   IR EXCHANGE BILIARY DRAIN  12/21/2020   IR EXCHANGE BILIARY DRAIN  02/03/2021   IR EXCHANGE BILIARY DRAIN  03/07/2021   IR EXCHANGE BILIARY DRAIN  05/04/2021   IR EXCHANGE BILIARY DRAIN  06/22/2021   IR EXCHANGE BILIARY DRAIN  07/04/2021   IR EXCHANGE BILIARY DRAIN  08/10/2021   IR PERC CHOLECYSTOSTOMY  01/27/2017   IR PERC CHOLECYSTOSTOMY  11/18/2019   IR RADIOLOGIST EVAL & MGMT  03/05/2017   IR RADIOLOGIST EVAL & MGMT  01/23/2023   IR SINUS/FIST TUBE CHK-NON GI  10/06/2020   MASTECTOMY, PARTIAL Right     Social History   Socioeconomic History   Marital status: Widowed    Spouse name: Not on file   Number of children: Not on file   Years of education: Not on file   Highest education level: Not on file  Occupational History   Occupation: retired  Tobacco  Use   Smoking status: Never   Smokeless tobacco: Never  Vaping Use   Vaping status: Never Used  Substance and Sexual Activity   Alcohol use: No   Drug use: No   Sexual activity: Not Currently  Other Topics Concern   Not on file  Social History Narrative   Long term resident of [facility]    Social Drivers of Health   Financial Resource Strain: Low Risk  (07/21/2018)   Overall Financial Resource Strain (CARDIA)    Difficulty of Paying Living Expenses: Not hard at all  Food Insecurity: No Food Insecurity (07/21/2018)   Hunger Vital Sign    Worried About Running Out of Food in the Last Year: Never true    Ran Out of Food in the Last Year: Never true  Transportation Needs: No Transportation Needs (07/21/2018)   PRAPARE - Therapist, art (Medical): No    Lack of Transportation (Non-Medical): No  Physical Activity: Inactive (07/21/2018)   Exercise Vital Sign    Days of Exercise per Week: 0 days    Minutes of Exercise per Session: 0 min  Stress: No Stress Concern Present (07/21/2018)   [employer] of Occupational Health - Occupational Stress Questionnaire    Feeling of Stress : Not at all  Social Connections: Socially Isolated (07/21/2018)   Social Connection and Isolation Panel [NHANES]    Frequency of Communication with Friends and Family: Never    Frequency of Social Gatherings with Friends and Family: Never    Attends Religious Services: Never    Database administrator or Organizations: No    Attends Banker Meetings: Never    Marital Status: Widowed  Intimate Partner Violence: Not At Risk (07/21/2018)   Humiliation, Afraid, Rape, and Kick questionnaire    Fear of Current or Ex-Partner: No    Emotionally Abused: No    Physically Abused: No    Sexually Abused: No   Family History  Problem Relation Age of Onset   Cancer Father        ? primary   Diabetes Neg Hx    Heart disease Neg Hx    Stroke Neg Hx       VITAL SIGNS BP 116/72   Pulse 73   Temp (!) 97.3 F (36.3 C)   Resp 18   Ht 5\' 6"  (1.676 m)   Wt 129 lb 9.6 oz (58.8 kg)   SpO2 96%   BMI 20.92 kg/m   Outpatient Encounter Medications as of 02/03/2024  Medication Sig   donepezil (ARICEPT) 5 MG tablet Take 5 mg by mouth at bedtime.   acetaminophen (TYLENOL) 325 MG tablet Take 650 mg by mouth every 6 (six) hours as needed for mild pain.   ALPRAZolam (XANAX) 0.5 MG tablet Take 1 tablet (0.5 mg total) by mouth daily as needed for anxiety.   [DISCONTINUED] donepezil (ARICEPT) 10 MG tablet Take 1 tablet by mouth at bedtime.   No facility-administered encounter medications on file as of 02/03/2024.     SIGNIFICANT DIAGNOSTIC EXAMS  LABS REVIEWED: PREVIOUS:    02-18-23: wbc 6.5; hgb 11.2; hct 34.8; mcv  96.4 plt 234; glucose 90; bun 19; creat 0.81; k+ 3.8; na++ 140; ca 8.4 gfr >60; protein 6.8 albumin 2.9    08-26-23: wbc 5.3; hgb 10.9; hct 34.0; mcv 96.3 plt 288; glucose 79; bun 13; creat 0.85; k+ 3.9; na++ 138; ca 8.4; gfr>60 protein 6.5 albumin 2.7   NO NEW LABS.  Review of Systems  Constitutional:  Negative for malaise/fatigue.  Respiratory:  Negative for cough and shortness of breath.   Cardiovascular:  Negative for chest pain, palpitations and leg swelling.  Gastrointestinal:  Negative for abdominal pain, constipation and heartburn.  Musculoskeletal:  Negative for back pain, joint pain and myalgias.  Skin: Negative.   Neurological:  Negative for dizziness.  Psychiatric/Behavioral:  The patient is not nervous/anxious.    Physical Exam Constitutional:      General: She is not in acute distress.    Appearance: She is well-developed. She is not diaphoretic.  Neck:     Thyroid: No thyromegaly.  Cardiovascular:     Rate and Rhythm: Normal rate and regular rhythm.     Pulses: Normal pulses.     Heart sounds: Murmur heard.  Pulmonary:     Effort: Pulmonary effort is normal. No respiratory distress.     Breath sounds: Normal breath sounds.  Chest:  Breasts:    Right: Absent.     Comments: Partial right mastectomy Abdominal:     General: Bowel sounds are normal. There is no distension.     Palpations: Abdomen is soft.     Tenderness: There is no abdominal tenderness.  Musculoskeletal:        General: Normal range of motion.     Cervical back: Neck supple.     Right lower leg: No edema.     Left lower leg: No edema.  Lymphadenopathy:     Cervical: No cervical adenopathy.  Skin:    General: Skin is warm and dry.     Comments:  Drain site without signs of infection present  Neurological:     Mental Status: She is alert. Mental status is at baseline.  Psychiatric:        Mood and Affect: Mood normal.   ASSESSMENT/ PLAN:  TODAY  Aortic atherosclerosis (HCC)  (ct  10-25-18) not on statin due to advanced age.  Major neurocognitive disorder (HCC) weight is 129 pounds; on aricept 5 mg daily   Vascular dementia, severe, without behavioral disturbance, psychotic disturbance, mood disturbance, and anxiety (HCC) weight is 129 pounds; on aricept 5 mg daily   PREVIOUS   4. Primary hypertension: b/p /116/72  5. Calculus of gall bladder without acute cholecystitis and obstruction: her chole drain remains out. Is voicing no complaints.   6. Chronic constipation: will continue miralax daily as needed  7. Post menopausal osteoporosis: t score -2.646   8. Herpes: without outbreak  9. Protein calorie malnutrition; albumin 3.0; will continue supplements as directed  10. Chronic anemia: hgb 11.9    Britt Candle NP [facility]   call [phone]

## 2024-02-06 NOTE — Assessment & Plan Note (Signed)
 weight is 129 pounds; on aricept 5 mg daily

## 2024-02-06 NOTE — Assessment & Plan Note (Signed)
(  ct 10-25-18) not on statin due to advanced age.

## 2024-03-11 ENCOUNTER — Encounter: Payer: Self-pay | Admitting: Adult Health

## 2024-03-11 NOTE — Progress Notes (Signed)
 Location:  [facility] Room Number: 114 Place of Service:  SNF (31)   CODE STATUS: dnr   No Known Allergies  Chief Complaint  Patient presents with   Acute Visit    Care plan meeting.     HPI:  We have come together for her care plan meeting.  BIMS 5/15 mood 0/30; can be agitated at times. She ambulates with a walker without falls. She requires independent with adl care except for bathing. She is frequently incontinent of bladder and bowel. Dietary: D3 setup for meals; appetite 76-100%; weight 133.6 pounds. Therapy: none at this. She continues to be followed for her chronic illnesses including:   Aortic atherosclerosis  Major neurocognitive disorder  Protein calorie malnutrition mild  Past Medical History:  Diagnosis Date   Aortic atherosclerosis (HCC) 11/15/2020   Breast cancer (HCC)    Cognitive communication deficit    Dementia (HCC)    Difficulty in walking(719.7)    Fall    HTN (hypertension) 08/26/2016   Muscle weakness (generalized)    Rash and nonspecific skin eruption    Thyroid  disease    hypo   Urinary tract infection, site not specified    Vitamin B deficiency     Past Surgical History:  Procedure Laterality Date   COLON SURGERY     IR CATHETER TUBE CHANGE  05/07/2017   IR EXCHANGE BILIARY DRAIN  07/02/2017   IR EXCHANGE BILIARY DRAIN  07/10/2017   IR EXCHANGE BILIARY DRAIN  08/28/2017   IR EXCHANGE BILIARY DRAIN  10/11/2017   IR EXCHANGE BILIARY DRAIN  10/28/2017   IR EXCHANGE BILIARY DRAIN  12/23/2017   IR EXCHANGE BILIARY DRAIN  01/23/2018   IR EXCHANGE BILIARY DRAIN  02/17/2018   IR EXCHANGE BILIARY DRAIN  03/20/2018   IR EXCHANGE BILIARY DRAIN  04/17/2018   IR EXCHANGE BILIARY DRAIN  05/29/2018   IR EXCHANGE BILIARY DRAIN  07/24/2018   IR EXCHANGE BILIARY DRAIN  09/22/2018   IR EXCHANGE BILIARY DRAIN  11/20/2018   IR EXCHANGE BILIARY DRAIN  02/11/2019   IR EXCHANGE BILIARY DRAIN  04/14/2019   IR EXCHANGE BILIARY DRAIN  06/09/2019   IR  EXCHANGE BILIARY DRAIN  11/26/2019   IR EXCHANGE BILIARY DRAIN  12/23/2019   IR EXCHANGE BILIARY DRAIN  02/22/2020   IR EXCHANGE BILIARY DRAIN  05/25/2020   IR EXCHANGE BILIARY DRAIN  07/29/2020   IR EXCHANGE BILIARY DRAIN  08/30/2020   IR EXCHANGE BILIARY DRAIN  10/06/2020   IR EXCHANGE BILIARY DRAIN  11/18/2020   IR EXCHANGE BILIARY DRAIN  12/21/2020   IR EXCHANGE BILIARY DRAIN  02/03/2021   IR EXCHANGE BILIARY DRAIN  03/07/2021   IR EXCHANGE BILIARY DRAIN  05/04/2021   IR EXCHANGE BILIARY DRAIN  06/22/2021   IR EXCHANGE BILIARY DRAIN  07/04/2021   IR EXCHANGE BILIARY DRAIN  08/10/2021   IR PERC CHOLECYSTOSTOMY  01/27/2017   IR PERC CHOLECYSTOSTOMY  11/18/2019   IR RADIOLOGIST EVAL & MGMT  03/05/2017   IR RADIOLOGIST EVAL & MGMT  01/23/2023   IR SINUS/FIST TUBE CHK-NON GI  10/06/2020   MASTECTOMY, PARTIAL Right     Social History   Socioeconomic History   Marital status: Widowed    Spouse name: Not on file   Number of children: Not on file   Years of education: Not on file   Highest education level: Not on file  Occupational History   Occupation: retired  Tobacco Use   Smoking status:  Never   Smokeless tobacco: Never  Vaping Use   Vaping status: Never Used  Substance and Sexual Activity   Alcohol use: No   Drug use: No   Sexual activity: Not Currently  Other Topics Concern   Not on file  Social History Narrative   Long term resident of [facility]    Social Drivers of Health   Financial Resource Strain: Low Risk  (07/21/2018)   Overall Financial Resource Strain (CARDIA)    Difficulty of Paying Living Expenses: Not hard at all  Food Insecurity: No Food Insecurity (07/21/2018)   Hunger Vital Sign    Worried About Running Out of Food in the Last Year: Never true    Ran Out of Food in the Last Year: Never true  Transportation Needs: No Transportation Needs (07/21/2018)   PRAPARE - Administrator, Civil Service (Medical): No    Lack of Transportation (Non-Medical): No  Physical  Activity: Inactive (07/21/2018)   Exercise Vital Sign    Days of Exercise per Week: 0 days    Minutes of Exercise per Session: 0 min  Stress: No Stress Concern Present (07/21/2018)   [employer] of Occupational Health - Occupational Stress Questionnaire    Feeling of Stress : Not at all  Social Connections: Socially Isolated (07/21/2018)   Social Connection and Isolation Panel [NHANES]    Frequency of Communication with Friends and Family: Never    Frequency of Social Gatherings with Friends and Family: Never    Attends Religious Services: Never    Database administrator or Organizations: No    Attends Banker Meetings: Never    Marital Status: Widowed  Intimate Partner Violence: Not At Risk (07/21/2018)   Humiliation, Afraid, Rape, and Kick questionnaire    Fear of Current or Ex-Partner: No    Emotionally Abused: No    Physically Abused: No    Sexually Abused: No   Family History  Problem Relation Age of Onset   Cancer Father        ? primary   Diabetes Neg Hx    Heart disease Neg Hx    Stroke Neg Hx       VITAL SIGNS BP (!) 100/53   Pulse 66   Temp (!) 97.2 F (36.2 C)   Resp 20   Ht 5\' 6"  (1.676 m)   Wt 133 lb 9.6 oz (60.6 kg)   SpO2 94%   BMI 21.56 kg/m   Outpatient Encounter Medications as of 03/12/2024  Medication Sig   acetaminophen  (TYLENOL ) 325 MG tablet Take 650 mg by mouth every 6 (six) hours as needed for mild pain.   ALPRAZolam  (XANAX ) 0.5 MG tablet Take 1 tablet (0.5 mg total) by mouth daily as needed for anxiety.   donepezil  (ARICEPT ) 5 MG tablet Take 5 mg by mouth at bedtime.   No facility-administered encounter medications on file as of 03/12/2024.     SIGNIFICANT DIAGNOSTIC EXAMS  LABS REVIEWED: PREVIOUS:    02-18-23: wbc 6.5; hgb 11.2; hct 34.8; mcv 96.4 plt 234; glucose 90; bun 19; creat 0.81; k+ 3.8; na++ 140; ca 8.4 gfr >60; protein 6.8 albumin 2.9    08-26-23: wbc 5.3; hgb 10.9; hct 34.0; mcv 96.3 plt 288; glucose 79; bun  13; creat 0.85; k+ 3.9; na++ 138; ca 8.4; gfr>60 protein 6.5 albumin 2.7   NO NEW LABS.   Review of Systems  Constitutional:  Negative for malaise/fatigue.  Respiratory:  Negative for cough and shortness of  breath.   Cardiovascular:  Negative for chest pain, palpitations and leg swelling.  Gastrointestinal:  Negative for abdominal pain, constipation and heartburn.  Musculoskeletal:  Negative for back pain, joint pain and myalgias.  Skin: Negative.   Neurological:  Negative for dizziness.  Psychiatric/Behavioral:  The patient is not nervous/anxious.    Physical Exam Constitutional:      General: She is not in acute distress.    Appearance: She is well-developed. She is not diaphoretic.  Neck:     Thyroid : No thyromegaly.  Cardiovascular:     Rate and Rhythm: Normal rate and regular rhythm.     Pulses: Normal pulses.     Heart sounds: Normal heart sounds.  Pulmonary:     Effort: Pulmonary effort is normal. No respiratory distress.     Breath sounds: Normal breath sounds.  Abdominal:     General: Bowel sounds are normal. There is no distension.     Palpations: Abdomen is soft.     Tenderness: There is no abdominal tenderness.  Musculoskeletal:        General: Normal range of motion.     Cervical back: Neck supple.     Right lower leg: No edema.     Left lower leg: No edema.  Lymphadenopathy:     Cervical: No cervical adenopathy.  Skin:    General: Skin is warm and dry.  Neurological:     Mental Status: She is alert. Mental status is at baseline.  Psychiatric:        Mood and Affect: Mood normal.      ASSESSMENT/ PLAN:  TODAY  Aortic atherosclerosis Major neurocognitive disorder Protein calorie malnutrition mild  Will continue current medications Will continue current plan of care Will continue to monitor her status  Time spent with patient: 40 minutes; dietary; medications; plan of care.    Britt Candle NP [facility]   call [phone]

## 2024-03-12 ENCOUNTER — Non-Acute Institutional Stay (SKILLED_NURSING_FACILITY): Admitting: Adult Health

## 2024-03-12 DIAGNOSIS — I700 Atherosclerosis of aorta: Secondary | ICD-10-CM

## 2024-03-12 DIAGNOSIS — F0390 Unspecified dementia without behavioral disturbance: Secondary | ICD-10-CM | POA: Diagnosis not present

## 2024-03-12 DIAGNOSIS — E441 Mild protein-calorie malnutrition: Secondary | ICD-10-CM | POA: Diagnosis not present

## 2024-03-18 ENCOUNTER — Non-Acute Institutional Stay (SKILLED_NURSING_FACILITY): Payer: Self-pay | Admitting: Internal Medicine

## 2024-03-18 ENCOUNTER — Encounter: Payer: Self-pay | Admitting: Internal Medicine

## 2024-03-18 DIAGNOSIS — D649 Anemia, unspecified: Secondary | ICD-10-CM

## 2024-03-18 DIAGNOSIS — I10 Essential (primary) hypertension: Secondary | ICD-10-CM | POA: Diagnosis not present

## 2024-03-18 DIAGNOSIS — E034 Atrophy of thyroid (acquired): Secondary | ICD-10-CM

## 2024-03-18 DIAGNOSIS — I700 Atherosclerosis of aorta: Secondary | ICD-10-CM

## 2024-03-18 DIAGNOSIS — F01C0 Vascular dementia, severe, without behavioral disturbance, psychotic disturbance, mood disturbance, and anxiety: Secondary | ICD-10-CM

## 2024-03-18 NOTE — Patient Instructions (Signed)
 See assessment and plan under each diagnosis in the problem list and acutely for this visit

## 2024-03-18 NOTE — Progress Notes (Unsigned)
 NURSING HOME LOCATION:  [facility] ROOM NUMBER:  [id number] D  CODE STATUS:  DNR  PCP: Marilyne Shu, NP   This is a nursing facility follow up visit for of chronic medical diagnoses to document compliance with Regulation 483.30 (c) in The Long Term Care Survey Manual Phase 2 which mandates caregiver visit ( visits can alternate among physician, PA or NP as per statutes) within 10 days of 30 days / 60 days/ 90 days post admission to SNF date  .  Interim medical record and care since last SNF visit was updated with review of diagnostic studies and change in clinical status since last visit were documented.  HPI: She is a permanent resident of this facility with medical diagnoses of vitamin D  deficiency, history of thyroid  disease, essential hypertension, history of breast cancer, aortic atherosclerosis, and dementia.  Labs are not current because of her advanced age, dementia, and DNR status.  Review of systems: Dementia invalidated responses.  Staff reports that she is becoming more confused.  She denied any active symptoms, stating that she is "okie,dokie." When I inquired about her appetite her response was "I eat what I want."  She then went on to say "but I am not used to this kind of this thing."  She did not elaborate as to what she meant.  Then she stated "yummy, yummy in the tummy."  Constitutional: No fever, significant weight change, fatigue  Eyes: No redness, discharge, pain, vision change ENT/mouth: No nasal congestion,  purulent discharge, earache, change in hearing, sore throat  Cardiovascular: No chest pain, palpitations, paroxysmal nocturnal dyspnea, edema  Respiratory: No cough, sputum production, hemoptysis, DOE, significant snoring, apnea   Gastrointestinal: No heartburn, dysphagia, abdominal pain, nausea /vomiting, rectal bleeding, melena, change in bowels Genitourinary: No dysuria, hematuria, pyuria, incontinence, nocturia Musculoskeletal: No joint  stiffness, joint swelling, weakness, pain Dermatologic: No rash, pruritus, change in appearance of skin Neurologic: No dizziness, headache, syncope, seizures, numbness, tingling Psychiatric: No significant anxiety, depression, insomnia, anorexia Endocrine: No change in hair/skin/nails, excessive thirst, excessive hunger, excessive urination  Hematologic/lymphatic: No significant bruising, lymphadenopathy, abnormal bleeding Allergy/immunology: No itchy/watery eyes, significant sneezing, urticaria, angioedema  Physical exam:  Pertinent or positive findings: She was initially asleep exhibiting hypopnea with mouth agape.  She was easily aroused.  She is hard of hearing.  As noted she exhibited confabulation.  Teeth are coated and somewhat malaligned.  There is suggestion of slight systolic murmur at the base.  Auscultation of the chest was limited as she would not follow commands to take a deep breath.  Abdomen slightly protuberant.  There is trace edema at the sock line.  Pedal pulses are not palpable.  General appearance: no acute distress, increased work of breathing is present.   Lymphatic: No lymphadenopathy about the head, neck, axilla. Eyes: No conjunctival inflammation or lid edema is present. There is no scleral icterus. Ears:  External ear exam shows no significant lesions or deformities.   Nose:  External nasal examination shows no deformity or inflammation. Nasal mucosa are pink and moist without lesions, exudates Neck:  No thyromegaly, masses, tenderness noted.    Heart:  Normal rate and regular rhythm. S1 and S2 normal without gallop, click, rub .  Lungs:  without wheezes, rhonchi, rales, rubs. Abdomen: Bowel sounds are normal. Abdomen is soft and nontender with no organomegaly, hernias, masses. GU: Deferred  Extremities:  No cyanosis, clubbing Neurologic exam :Balance, Rhomberg, finger to nose testing could not be completed due  to clinical state Skin: Warm & dry w/o tenting. No  significant lesions or rash.  See summary under each active problem in the Problem List with associated updated therapeutic plan

## 2024-03-19 NOTE — Assessment & Plan Note (Signed)
 TSH was therapeutic @ 3.333 in Nov 24. Clinically she remains euthyroid. Recheck TSH in 11/25 adequate.

## 2024-03-19 NOTE — Assessment & Plan Note (Signed)
She denies any anginal equivalent. 

## 2024-03-19 NOTE — Assessment & Plan Note (Addendum)
 H/H not current , but no bleeding dyscrasias reported. Recheck CBC with iron panel & B12 if symptoms or signs present.

## 2024-03-19 NOTE — Assessment & Plan Note (Signed)
 BP controlled w/o meds; continue to monitor.

## 2024-03-19 NOTE — Assessment & Plan Note (Signed)
 See 03/18/24 she confabulates nonsensically and has difficulty following commands.  Today she did not exhibit her usual distrust of interviewer and actually was interactive.

## 2024-03-26 DIAGNOSIS — Z9181 History of falling: Secondary | ICD-10-CM | POA: Diagnosis not present

## 2024-03-26 DIAGNOSIS — R488 Other symbolic dysfunctions: Secondary | ICD-10-CM | POA: Diagnosis not present

## 2024-03-27 DIAGNOSIS — Z9181 History of falling: Secondary | ICD-10-CM | POA: Diagnosis not present

## 2024-03-27 DIAGNOSIS — R488 Other symbolic dysfunctions: Secondary | ICD-10-CM | POA: Diagnosis not present

## 2024-03-30 DIAGNOSIS — R488 Other symbolic dysfunctions: Secondary | ICD-10-CM | POA: Diagnosis not present

## 2024-03-30 DIAGNOSIS — Z9181 History of falling: Secondary | ICD-10-CM | POA: Diagnosis not present

## 2024-03-31 DIAGNOSIS — R488 Other symbolic dysfunctions: Secondary | ICD-10-CM | POA: Diagnosis not present

## 2024-03-31 DIAGNOSIS — Z9181 History of falling: Secondary | ICD-10-CM | POA: Diagnosis not present

## 2024-04-01 DIAGNOSIS — Z9181 History of falling: Secondary | ICD-10-CM | POA: Diagnosis not present

## 2024-04-01 DIAGNOSIS — R488 Other symbolic dysfunctions: Secondary | ICD-10-CM | POA: Diagnosis not present

## 2024-04-02 DIAGNOSIS — R488 Other symbolic dysfunctions: Secondary | ICD-10-CM | POA: Diagnosis not present

## 2024-04-02 DIAGNOSIS — Z9181 History of falling: Secondary | ICD-10-CM | POA: Diagnosis not present

## 2024-04-03 DIAGNOSIS — R488 Other symbolic dysfunctions: Secondary | ICD-10-CM | POA: Diagnosis not present

## 2024-04-03 DIAGNOSIS — Z9181 History of falling: Secondary | ICD-10-CM | POA: Diagnosis not present

## 2024-04-06 DIAGNOSIS — R488 Other symbolic dysfunctions: Secondary | ICD-10-CM | POA: Diagnosis not present

## 2024-04-06 DIAGNOSIS — Z9181 History of falling: Secondary | ICD-10-CM | POA: Diagnosis not present

## 2024-04-07 DIAGNOSIS — Z9181 History of falling: Secondary | ICD-10-CM | POA: Diagnosis not present

## 2024-04-07 DIAGNOSIS — R488 Other symbolic dysfunctions: Secondary | ICD-10-CM | POA: Diagnosis not present

## 2024-04-08 DIAGNOSIS — Z9181 History of falling: Secondary | ICD-10-CM | POA: Diagnosis not present

## 2024-04-08 DIAGNOSIS — R488 Other symbolic dysfunctions: Secondary | ICD-10-CM | POA: Diagnosis not present

## 2024-04-09 DIAGNOSIS — R488 Other symbolic dysfunctions: Secondary | ICD-10-CM | POA: Diagnosis not present

## 2024-04-09 DIAGNOSIS — Z9181 History of falling: Secondary | ICD-10-CM | POA: Diagnosis not present

## 2024-04-14 ENCOUNTER — Non-Acute Institutional Stay (SKILLED_NURSING_FACILITY): Payer: Self-pay | Admitting: Adult Health

## 2024-04-14 ENCOUNTER — Encounter: Payer: Self-pay | Admitting: Adult Health

## 2024-04-14 DIAGNOSIS — K8061 Calculus of gallbladder and bile duct with cholecystitis, unspecified, with obstruction: Secondary | ICD-10-CM | POA: Diagnosis not present

## 2024-04-14 DIAGNOSIS — I10 Essential (primary) hypertension: Secondary | ICD-10-CM

## 2024-04-14 DIAGNOSIS — K5909 Other constipation: Secondary | ICD-10-CM

## 2024-04-14 NOTE — Progress Notes (Signed)
 Location:  [facility] Room Number: 114 Place of Service:  SNF (31)   CODE STATUS: dnr   No Known Allergies  Chief Complaint  Patient presents with   Medical Management of Chronic Issues             Primary hypertension: Calculus of gall bladder without acute cholecystitis and obstruction:  Chronic constipation    HPI:  She is a 89 y.o. long term resident of this facility being seen for the management of her chronic illnesses: Primary hypertension: Calculus of gall bladder without acute cholecystitis and obstruction:  Chronic constipation.there are no reports of uncontrolled pain. Her weight is stable; she remains ambulatory.    Past Medical History:  Diagnosis Date   Aortic atherosclerosis (HCC) 11/15/2020   Breast cancer (HCC)    Cognitive communication deficit    Dementia (HCC)    Difficulty in walking(719.7)    Fall    HTN (hypertension) 08/26/2016   Muscle weakness (generalized)    Rash and nonspecific skin eruption    Thyroid  disease    hypo   Urinary tract infection, site not specified    Vitamin B deficiency     Past Surgical History:  Procedure Laterality Date   COLON SURGERY     IR CATHETER TUBE CHANGE  05/07/2017   IR EXCHANGE BILIARY DRAIN  07/02/2017   IR EXCHANGE BILIARY DRAIN  07/10/2017   IR EXCHANGE BILIARY DRAIN  08/28/2017   IR EXCHANGE BILIARY DRAIN  10/11/2017   IR EXCHANGE BILIARY DRAIN  10/28/2017   IR EXCHANGE BILIARY DRAIN  12/23/2017   IR EXCHANGE BILIARY DRAIN  01/23/2018   IR EXCHANGE BILIARY DRAIN  02/17/2018   IR EXCHANGE BILIARY DRAIN  03/20/2018   IR EXCHANGE BILIARY DRAIN  04/17/2018   IR EXCHANGE BILIARY DRAIN  05/29/2018   IR EXCHANGE BILIARY DRAIN  07/24/2018   IR EXCHANGE BILIARY DRAIN  09/22/2018   IR EXCHANGE BILIARY DRAIN  11/20/2018   IR EXCHANGE BILIARY DRAIN  02/11/2019   IR EXCHANGE BILIARY DRAIN  04/14/2019   IR EXCHANGE BILIARY DRAIN  06/09/2019   IR EXCHANGE BILIARY DRAIN  11/26/2019   IR EXCHANGE BILIARY DRAIN   12/23/2019   IR EXCHANGE BILIARY DRAIN  02/22/2020   IR EXCHANGE BILIARY DRAIN  05/25/2020   IR EXCHANGE BILIARY DRAIN  07/29/2020   IR EXCHANGE BILIARY DRAIN  08/30/2020   IR EXCHANGE BILIARY DRAIN  10/06/2020   IR EXCHANGE BILIARY DRAIN  11/18/2020   IR EXCHANGE BILIARY DRAIN  12/21/2020   IR EXCHANGE BILIARY DRAIN  02/03/2021   IR EXCHANGE BILIARY DRAIN  03/07/2021   IR EXCHANGE BILIARY DRAIN  05/04/2021   IR EXCHANGE BILIARY DRAIN  06/22/2021   IR EXCHANGE BILIARY DRAIN  07/04/2021   IR EXCHANGE BILIARY DRAIN  08/10/2021   IR PERC CHOLECYSTOSTOMY  01/27/2017   IR PERC CHOLECYSTOSTOMY  11/18/2019   IR RADIOLOGIST EVAL & MGMT  03/05/2017   IR RADIOLOGIST EVAL & MGMT  01/23/2023   IR SINUS/FIST TUBE CHK-NON GI  10/06/2020   MASTECTOMY, PARTIAL Right     Social History   Socioeconomic History   Marital status: Widowed    Spouse name: Not on file   Number of children: Not on file   Years of education: Not on file   Highest education level: Not on file  Occupational History   Occupation: retired  Tobacco Use   Smoking status: Never   Smokeless tobacco: Never  Vaping Use  Vaping status: Never Used  Substance and Sexual Activity   Alcohol use: No   Drug use: No   Sexual activity: Not Currently  Other Topics Concern   Not on file  Social History Narrative   Long term resident of [facility]    Social Drivers of Health   Financial Resource Strain: Low Risk  (07/21/2018)   Overall Financial Resource Strain (CARDIA)    Difficulty of Paying Living Expenses: Not hard at all  Food Insecurity: No Food Insecurity (07/21/2018)   Hunger Vital Sign    Worried About Running Out of Food in the Last Year: Never true    Ran Out of Food in the Last Year: Never true  Transportation Needs: No Transportation Needs (07/21/2018)   PRAPARE - Administrator, Civil Service (Medical): No    Lack of Transportation (Non-Medical): No  Physical Activity: Inactive (07/21/2018)   Exercise Vital Sign    Days of  Exercise per Week: 0 days    Minutes of Exercise per Session: 0 min  Stress: No Stress Concern Present (07/21/2018)   [employer] of Occupational Health - Occupational Stress Questionnaire    Feeling of Stress : Not at all  Social Connections: Socially Isolated (07/21/2018)   Social Connection and Isolation Panel    Frequency of Communication with Friends and Family: Never    Frequency of Social Gatherings with Friends and Family: Never    Attends Religious Services: Never    Database administrator or Organizations: No    Attends Banker Meetings: Never    Marital Status: Widowed  Intimate Partner Violence: Not At Risk (07/21/2018)   Humiliation, Afraid, Rape, and Kick questionnaire    Fear of Current or Ex-Partner: No    Emotionally Abused: No    Physically Abused: No    Sexually Abused: No   Family History  Problem Relation Age of Onset   Cancer Father        ? primary   Diabetes Neg Hx    Heart disease Neg Hx    Stroke Neg Hx       VITAL SIGNS BP (!) 142/70   Pulse 74   Temp 98.1 F (36.7 C)   Resp 20   Ht 5' 6 (1.676 m)   Wt 134 lb 9.6 oz (61.1 kg)   SpO2 97%   BMI 21.73 kg/m   Outpatient Encounter Medications as of 04/14/2024  Medication Sig   acetaminophen  (TYLENOL ) 325 MG tablet Take 650 mg by mouth every 6 (six) hours as needed for mild pain.   ALPRAZolam  (XANAX ) 0.5 MG tablet Take 1 tablet (0.5 mg total) by mouth daily as needed for anxiety.   donepezil  (ARICEPT ) 5 MG tablet Take 5 mg by mouth at bedtime.   No facility-administered encounter medications on file as of 04/14/2024.     SIGNIFICANT DIAGNOSTIC EXAMS  LABS REVIEWED: PREVIOUS:       08-26-23: wbc 5.3; hgb 10.9; hct 34.0; mcv 96.3 plt 288; glucose 79; bun 13; creat 0.85; k+ 3.9; na++ 138; ca 8.4; gfr>60 protein 6.5 albumin 2.7   NO NEW LABS.   Review of Systems  Constitutional:  Negative for malaise/fatigue.  Respiratory:  Negative for cough and shortness of breath.    Cardiovascular:  Negative for chest pain, palpitations and leg swelling.  Gastrointestinal:  Negative for abdominal pain, constipation and heartburn.  Musculoskeletal:  Negative for back pain, joint pain and myalgias.  Skin: Negative.   Neurological:  Negative  for dizziness.  Psychiatric/Behavioral:  The patient is not nervous/anxious.    Physical Exam Constitutional:      General: She is not in acute distress.    Appearance: She is well-developed. She is not diaphoretic.  Neck:     Thyroid : No thyromegaly.   Cardiovascular:     Rate and Rhythm: Normal rate and regular rhythm.     Pulses: Normal pulses.     Heart sounds: Murmur heard.  Pulmonary:     Effort: Pulmonary effort is normal. No respiratory distress.     Breath sounds: Normal breath sounds.  Chest:     Comments: Partial right mastectomy  Abdominal:     General: Bowel sounds are normal. There is no distension.     Palpations: Abdomen is soft.     Tenderness: There is no abdominal tenderness.   Musculoskeletal:        General: Normal range of motion.     Cervical back: Neck supple.     Right lower leg: No edema.     Left lower leg: No edema.  Lymphadenopathy:     Cervical: No cervical adenopathy.   Skin:    General: Skin is warm and dry.   Neurological:     Mental Status: She is alert. Mental status is at baseline.   Psychiatric:        Mood and Affect: Mood normal.     ASSESSMENT/ PLAN:  TODAY  Primary hypertension: b/p 142/70  2. Calculus of gall bladder without acute cholecystitis and obstruction: her drain remains out; no pain present.   3. Chronic constipation: will monitor   PREVIOUS   4. Post menopausal osteoporosis: t score -2.646   5. Herpes: without outbreak  6. Protein calorie malnutrition; albumin 3.0; will continue supplements as directed  7. Chronic anemia: hgb 11.9   8. Aortic atherosclerosis (HCC) (ct 10-25-18) not on statin due to advanced age.  9. Major neurocognitive  disorder (HCC)  weight is 134 pounds; on aricept  5 mg daily   10. Vascular dementia, severe, without behavioral disturbance, psychotic disturbance, mood disturbance, and anxiety (HCC)  weight is 134 pounds; on aricept  5 mg daily       Barnie Seip NP [facility]   call [phone]

## 2024-04-17 ENCOUNTER — Encounter: Payer: Self-pay | Admitting: Adult Health

## 2024-04-17 ENCOUNTER — Non-Acute Institutional Stay (SKILLED_NURSING_FACILITY): Admitting: Adult Health

## 2024-04-17 DIAGNOSIS — E441 Mild protein-calorie malnutrition: Secondary | ICD-10-CM

## 2024-04-17 DIAGNOSIS — I700 Atherosclerosis of aorta: Secondary | ICD-10-CM

## 2024-04-17 DIAGNOSIS — F0390 Unspecified dementia without behavioral disturbance: Secondary | ICD-10-CM

## 2024-04-17 NOTE — Progress Notes (Signed)
 Location:  [facility] Room Number: 110-D Place of Service:  SNF (31)   CODE STATUS: DNR  No Known Allergies  Chief Complaint  Patient presents with   Acute Visit    Care plan meeting.    HPI:  We have come together for her care plan meeting. BIMS 3/15 mood 0/30 BCAT 9/50: has disorganized thoughts; rejects care and has delusions which do not cause her distress. She is ambulatory with a walker with one fall: bruise on forehead and skin tear. She requires: independent assist with her adls. She is occasionally incontinent of bladder and frequently incontinent of bowel. Dietary: D3 diet feeds self; 76100% of meals weight is 134.6 pounds. Therapy: none at this time. Activities: socializing pets food activities. She will continue to be followed for her chronic illnesses including:   Aortic atherosclerosis  Major neurocognitive disorder  Mild protein calorie malnutrition  Past Medical History:  Diagnosis Date   Aortic atherosclerosis (HCC) 11/15/2020   Breast cancer (HCC)    Cognitive communication deficit    Dementia (HCC)    Difficulty in walking(719.7)    Fall    HTN (hypertension) 08/26/2016   Muscle weakness (generalized)    Rash and nonspecific skin eruption    Thyroid  disease    hypo   Urinary tract infection, site not specified    Vitamin B deficiency     Past Surgical History:  Procedure Laterality Date   COLON SURGERY     IR CATHETER TUBE CHANGE  05/07/2017   IR EXCHANGE BILIARY DRAIN  07/02/2017   IR EXCHANGE BILIARY DRAIN  07/10/2017   IR EXCHANGE BILIARY DRAIN  08/28/2017   IR EXCHANGE BILIARY DRAIN  10/11/2017   IR EXCHANGE BILIARY DRAIN  10/28/2017   IR EXCHANGE BILIARY DRAIN  12/23/2017   IR EXCHANGE BILIARY DRAIN  01/23/2018   IR EXCHANGE BILIARY DRAIN  02/17/2018   IR EXCHANGE BILIARY DRAIN  03/20/2018   IR EXCHANGE BILIARY DRAIN  04/17/2018   IR EXCHANGE BILIARY DRAIN  05/29/2018   IR EXCHANGE BILIARY DRAIN  07/24/2018   IR EXCHANGE BILIARY DRAIN   09/22/2018   IR EXCHANGE BILIARY DRAIN  11/20/2018   IR EXCHANGE BILIARY DRAIN  02/11/2019   IR EXCHANGE BILIARY DRAIN  04/14/2019   IR EXCHANGE BILIARY DRAIN  06/09/2019   IR EXCHANGE BILIARY DRAIN  11/26/2019   IR EXCHANGE BILIARY DRAIN  12/23/2019   IR EXCHANGE BILIARY DRAIN  02/22/2020   IR EXCHANGE BILIARY DRAIN  05/25/2020   IR EXCHANGE BILIARY DRAIN  07/29/2020   IR EXCHANGE BILIARY DRAIN  08/30/2020   IR EXCHANGE BILIARY DRAIN  10/06/2020   IR EXCHANGE BILIARY DRAIN  11/18/2020   IR EXCHANGE BILIARY DRAIN  12/21/2020   IR EXCHANGE BILIARY DRAIN  02/03/2021   IR EXCHANGE BILIARY DRAIN  03/07/2021   IR EXCHANGE BILIARY DRAIN  05/04/2021   IR EXCHANGE BILIARY DRAIN  06/22/2021   IR EXCHANGE BILIARY DRAIN  07/04/2021   IR EXCHANGE BILIARY DRAIN  08/10/2021   IR PERC CHOLECYSTOSTOMY  01/27/2017   IR PERC CHOLECYSTOSTOMY  11/18/2019   IR RADIOLOGIST EVAL & MGMT  03/05/2017   IR RADIOLOGIST EVAL & MGMT  01/23/2023   IR SINUS/FIST TUBE CHK-NON GI  10/06/2020   MASTECTOMY, PARTIAL Right     Social History   Socioeconomic History   Marital status: Widowed    Spouse name: Not on file   Number of children: Not on file   Years of education:  Not on file   Highest education level: Not on file  Occupational History   Occupation: retired  Tobacco Use   Smoking status: Never   Smokeless tobacco: Never  Vaping Use   Vaping status: Never Used  Substance and Sexual Activity   Alcohol use: No   Drug use: No   Sexual activity: Not Currently  Other Topics Concern   Not on file  Social History Narrative   Long term resident of [facility]    Social Drivers of Health   Financial Resource Strain: Low Risk  (07/21/2018)   Overall Financial Resource Strain (CARDIA)    Difficulty of Paying Living Expenses: Not hard at all  Food Insecurity: No Food Insecurity (07/21/2018)   Hunger Vital Sign    Worried About Running Out of Food in the Last Year: Never true    Ran Out of Food in the Last Year: Never true   Transportation Needs: No Transportation Needs (07/21/2018)   PRAPARE - Administrator, Civil Service (Medical): No    Lack of Transportation (Non-Medical): No  Physical Activity: Inactive (07/21/2018)   Exercise Vital Sign    Days of Exercise per Week: 0 days    Minutes of Exercise per Session: 0 min  Stress: No Stress Concern Present (07/21/2018)   [employer] of Occupational Health - Occupational Stress Questionnaire    Feeling of Stress : Not at all  Social Connections: Socially Isolated (07/21/2018)   Social Connection and Isolation Panel    Frequency of Communication with Friends and Family: Never    Frequency of Social Gatherings with Friends and Family: Never    Attends Religious Services: Never    Database administrator or Organizations: No    Attends Banker Meetings: Never    Marital Status: Widowed  Intimate Partner Violence: Not At Risk (07/21/2018)   Humiliation, Afraid, Rape, and Kick questionnaire    Fear of Current or Ex-Partner: No    Emotionally Abused: No    Physically Abused: No    Sexually Abused: No   Family History  Problem Relation Age of Onset   Cancer Father        ? primary   Diabetes Neg Hx    Heart disease Neg Hx    Stroke Neg Hx       VITAL SIGNS BP (!) 142/70   Pulse 74   Temp 98.1 F (36.7 C)   Resp 20   Ht 5' 6 (1.676 m)   Wt 134 lb 9.6 oz (61.1 kg)   SpO2 97%   BMI 21.73 kg/m   Outpatient Encounter Medications as of 04/17/2024  Medication Sig   ALPRAZolam  (XANAX ) 0.5 MG tablet Take 1 tablet (0.5 mg total) by mouth daily as needed for anxiety.   donepezil  (ARICEPT ) 5 MG tablet Take 5 mg by mouth at bedtime.   acetaminophen  (TYLENOL ) 325 MG tablet Take 650 mg by mouth every 6 (six) hours as needed for mild pain. (Patient not taking: Reported on 04/17/2024)   No facility-administered encounter medications on file as of 04/17/2024.     SIGNIFICANT DIAGNOSTIC EXAMS  LABS REVIEWED: PREVIOUS:        08-26-23: wbc 5.3; hgb 10.9; hct 34.0; mcv 96.3 plt 288; glucose 79; bun 13; creat 0.85; k+ 3.9; na++ 138; ca 8.4; gfr>60 protein 6.5 albumin 2.7   NO NEW LABS.   Review of Systems  Constitutional:  Negative for malaise/fatigue.  Respiratory:  Negative for cough and shortness of  breath.   Cardiovascular:  Negative for chest pain, palpitations and leg swelling.  Gastrointestinal:  Negative for abdominal pain, constipation and heartburn.  Musculoskeletal:  Negative for back pain, joint pain and myalgias.  Skin: Negative.   Neurological:  Negative for dizziness.  Psychiatric/Behavioral:  The patient is not nervous/anxious.    Physical Exam Constitutional:      General: She is not in acute distress.    Appearance: She is well-developed. She is not diaphoretic.  Neck:     Thyroid : No thyromegaly.   Cardiovascular:     Rate and Rhythm: Normal rate and regular rhythm.     Pulses: Normal pulses.     Heart sounds: Murmur heard.  Pulmonary:     Effort: Pulmonary effort is normal. No respiratory distress.     Breath sounds: Normal breath sounds.  Chest:     Comments:  Partial right mastectomy Abdominal:     General: Bowel sounds are normal. There is no distension.     Palpations: Abdomen is soft.     Tenderness: There is no abdominal tenderness.   Musculoskeletal:        General: Normal range of motion.     Cervical back: Neck supple.     Right lower leg: No edema.     Left lower leg: No edema.  Lymphadenopathy:     Cervical: No cervical adenopathy.   Skin:    General: Skin is warm and dry.   Neurological:     Mental Status: She is alert. Mental status is at baseline.   Psychiatric:        Mood and Affect: Mood normal.      ASSESSMENT/ PLAN:  TODAY  Aortic atherosclerosis Major neurocognitive disorder Mild protein calorie malnutrition  Will continue current medications Will continue current plan of care Will continue to monitor her status   Time spent with  patient: 40 minutes: medications; dietary; plan of care.    Barnie Seip NP [facility]   call [phone]

## 2024-05-18 ENCOUNTER — Encounter: Payer: Self-pay | Admitting: Adult Health

## 2024-05-18 ENCOUNTER — Non-Acute Institutional Stay (SKILLED_NURSING_FACILITY): Payer: Self-pay | Admitting: Adult Health

## 2024-05-18 DIAGNOSIS — M810 Age-related osteoporosis without current pathological fracture: Secondary | ICD-10-CM

## 2024-05-18 DIAGNOSIS — E441 Mild protein-calorie malnutrition: Secondary | ICD-10-CM

## 2024-05-18 DIAGNOSIS — B009 Herpesviral infection, unspecified: Secondary | ICD-10-CM

## 2024-05-18 NOTE — Progress Notes (Unsigned)
 Location:  [facility] Room Number: 110 Place of Service:  SNF (31)   CODE STATUS: dnr   No Known Allergies  Chief Complaint  Patient presents with   Medical Management of Chronic Issues              Post menopausal osteoporosis: Herpes:  Protein calorie malnutrition    HPI:  She is a 89 y.o. long term resident of this facility being seen for the management of her chronic illnesses:Post menopausal osteoporosis: Herpes:  Protein calorie malnutrition. She continues to be ambulatory with a walker. There are no reports of uncontrolled pain. Her weight remains stable; there have been no reports of anxiety present.    Past Medical History:  Diagnosis Date   Aortic atherosclerosis (HCC) 11/15/2020   Breast cancer (HCC)    Cognitive communication deficit    Dementia (HCC)    Difficulty in walking(719.7)    Fall    HTN (hypertension) 08/26/2016   Muscle weakness (generalized)    Rash and nonspecific skin eruption    Thyroid  disease    hypo   Urinary tract infection, site not specified    Vitamin B deficiency     Past Surgical History:  Procedure Laterality Date   COLON SURGERY     IR CATHETER TUBE CHANGE  05/07/2017   IR EXCHANGE BILIARY DRAIN  07/02/2017   IR EXCHANGE BILIARY DRAIN  07/10/2017   IR EXCHANGE BILIARY DRAIN  08/28/2017   IR EXCHANGE BILIARY DRAIN  10/11/2017   IR EXCHANGE BILIARY DRAIN  10/28/2017   IR EXCHANGE BILIARY DRAIN  12/23/2017   IR EXCHANGE BILIARY DRAIN  01/23/2018   IR EXCHANGE BILIARY DRAIN  02/17/2018   IR EXCHANGE BILIARY DRAIN  03/20/2018   IR EXCHANGE BILIARY DRAIN  04/17/2018   IR EXCHANGE BILIARY DRAIN  05/29/2018   IR EXCHANGE BILIARY DRAIN  07/24/2018   IR EXCHANGE BILIARY DRAIN  09/22/2018   IR EXCHANGE BILIARY DRAIN  11/20/2018   IR EXCHANGE BILIARY DRAIN  02/11/2019   IR EXCHANGE BILIARY DRAIN  04/14/2019   IR EXCHANGE BILIARY DRAIN  06/09/2019   IR EXCHANGE BILIARY DRAIN  11/26/2019   IR EXCHANGE BILIARY DRAIN  12/23/2019   IR  EXCHANGE BILIARY DRAIN  02/22/2020   IR EXCHANGE BILIARY DRAIN  05/25/2020   IR EXCHANGE BILIARY DRAIN  07/29/2020   IR EXCHANGE BILIARY DRAIN  08/30/2020   IR EXCHANGE BILIARY DRAIN  10/06/2020   IR EXCHANGE BILIARY DRAIN  11/18/2020   IR EXCHANGE BILIARY DRAIN  12/21/2020   IR EXCHANGE BILIARY DRAIN  02/03/2021   IR EXCHANGE BILIARY DRAIN  03/07/2021   IR EXCHANGE BILIARY DRAIN  05/04/2021   IR EXCHANGE BILIARY DRAIN  06/22/2021   IR EXCHANGE BILIARY DRAIN  07/04/2021   IR EXCHANGE BILIARY DRAIN  08/10/2021   IR PERC CHOLECYSTOSTOMY  01/27/2017   IR PERC CHOLECYSTOSTOMY  11/18/2019   IR RADIOLOGIST EVAL & MGMT  03/05/2017   IR RADIOLOGIST EVAL & MGMT  01/23/2023   IR SINUS/FIST TUBE CHK-NON GI  10/06/2020   MASTECTOMY, PARTIAL Right     Social History   Socioeconomic History   Marital status: Widowed    Spouse name: Not on file   Number of children: Not on file   Years of education: Not on file   Highest education level: Not on file  Occupational History   Occupation: retired  Tobacco Use   Smoking status: Never   Smokeless tobacco: Never  Vaping Use   Vaping status: Never Used  Substance and Sexual Activity   Alcohol use: No   Drug use: No   Sexual activity: Not Currently  Other Topics Concern   Not on file  Social History Narrative   Long term resident of [facility]    Social Drivers of Health   Financial Resource Strain: Low Risk  (07/21/2018)   Overall Financial Resource Strain (CARDIA)    Difficulty of Paying Living Expenses: Not hard at all  Food Insecurity: No Food Insecurity (07/21/2018)   Hunger Vital Sign    Worried About Running Out of Food in the Last Year: Never true    Ran Out of Food in the Last Year: Never true  Transportation Needs: No Transportation Needs (07/21/2018)   PRAPARE - Administrator, Civil Service (Medical): No    Lack of Transportation (Non-Medical): No  Physical Activity: Inactive (07/21/2018)   Exercise Vital Sign    Days of Exercise per Week:  0 days    Minutes of Exercise per Session: 0 min  Stress: No Stress Concern Present (07/21/2018)   [employer] of Occupational Health - Occupational Stress Questionnaire    Feeling of Stress : Not at all  Social Connections: Socially Isolated (07/21/2018)   Social Connection and Isolation Panel    Frequency of Communication with Friends and Family: Never    Frequency of Social Gatherings with Friends and Family: Never    Attends Religious Services: Never    Database administrator or Organizations: No    Attends Banker Meetings: Never    Marital Status: Widowed  Intimate Partner Violence: Not At Risk (07/21/2018)   Humiliation, Afraid, Rape, and Kick questionnaire    Fear of Current or Ex-Partner: No    Emotionally Abused: No    Physically Abused: No    Sexually Abused: No   Family History  Problem Relation Age of Onset   Cancer Father        ? primary   Diabetes Neg Hx    Heart disease Neg Hx    Stroke Neg Hx       VITAL SIGNS BP 117/74   Pulse 76   Temp 98.5 F (36.9 C)   Resp 20   Ht 5' 6 (1.676 m)   Wt 137 lb 12.8 oz (62.5 kg)   SpO2 96%   BMI 22.24 kg/m   Outpatient Encounter Medications as of 05/18/2024  Medication Sig   acetaminophen  (TYLENOL ) 325 MG tablet Take 650 mg by mouth every 6 (six) hours as needed for mild pain. (Patient not taking: Reported on 04/17/2024)   ALPRAZolam  (XANAX ) 0.5 MG tablet Take 1 tablet (0.5 mg total) by mouth daily as needed for anxiety.   donepezil  (ARICEPT ) 5 MG tablet Take 5 mg by mouth at bedtime.   No facility-administered encounter medications on file as of 05/18/2024.     SIGNIFICANT DIAGNOSTIC EXAMS  LABS REVIEWED: PREVIOUS:       08-26-23: wbc 5.3; hgb 10.9; hct 34.0; mcv 96.3 plt 288; glucose 79; bun 13; creat 0.85; k+ 3.9; na++ 138; ca 8.4; gfr>60 protein 6.5 albumin 2.7   NO NEW LABS.   Review of Systems  Constitutional:  Negative for malaise/fatigue.  Respiratory:  Negative for cough and  shortness of breath.   Cardiovascular:  Negative for chest pain, palpitations and leg swelling.  Gastrointestinal:  Negative for abdominal pain, constipation and heartburn.  Musculoskeletal:  Negative for back pain, joint pain and  myalgias.  Skin: Negative.   Neurological:  Negative for dizziness.  Psychiatric/Behavioral:  The patient is not nervous/anxious.    Physical Exam Constitutional:      General: She is not in acute distress.    Appearance: She is well-developed. She is not diaphoretic.  Neck:     Thyroid : No thyromegaly.  Cardiovascular:     Rate and Rhythm: Normal rate and regular rhythm.     Pulses: Normal pulses.     Heart sounds: Murmur heard.  Pulmonary:     Effort: Pulmonary effort is normal. No respiratory distress.     Breath sounds: Normal breath sounds.  Abdominal:     General: Bowel sounds are normal. There is no distension.     Palpations: Abdomen is soft.     Tenderness: There is no abdominal tenderness.  Musculoskeletal:        General: Normal range of motion.     Cervical back: Neck supple.     Right lower leg: No edema.     Left lower leg: No edema.  Lymphadenopathy:     Cervical: No cervical adenopathy.  Skin:    General: Skin is warm and dry.  Neurological:     Mental Status: She is alert. Mental status is at baseline.  Psychiatric:        Mood and Affect: Mood normal.      ASSESSMENT/ PLAN:  TODAY  Post menopausal osteoporosis: t score -2.646  2. Herpes: without outbreak  3. Protein calorie malnutrition: albumin 3.0 will continue supplements as directed.   PREVIOUS   4. Chronic anemia: hgb 11.9   5. Aortic atherosclerosis (HCC) (ct 10-25-18) not on statin due to advanced age.  6. Major neurocognitive disorder (HCC)  weight is 137 pounds; on aricept  5 mg daily   7. Vascular dementia, severe, without behavioral disturbance, psychotic disturbance, mood disturbance, and anxiety (HCC)  weight is 137 pounds; on aricept  5 mg daily   8.  Primary hypertension: b/p 117/74  9. Calculus of gall bladder without acute cholecystitis and obstruction: her drain remains out; no pain present.   10. Chronic constipation: will monitor     Barnie Seip NP [facility]  call [phone]

## 2024-06-01 ENCOUNTER — Encounter: Payer: Self-pay | Admitting: Internal Medicine

## 2024-06-01 ENCOUNTER — Non-Acute Institutional Stay (SKILLED_NURSING_FACILITY): Admitting: Internal Medicine

## 2024-06-01 DIAGNOSIS — D649 Anemia, unspecified: Secondary | ICD-10-CM

## 2024-06-01 DIAGNOSIS — E441 Mild protein-calorie malnutrition: Secondary | ICD-10-CM | POA: Diagnosis not present

## 2024-06-01 DIAGNOSIS — E034 Atrophy of thyroid (acquired): Secondary | ICD-10-CM

## 2024-06-01 DIAGNOSIS — F03918 Unspecified dementia, unspecified severity, with other behavioral disturbance: Secondary | ICD-10-CM | POA: Diagnosis not present

## 2024-06-01 NOTE — Assessment & Plan Note (Signed)
 TSH was therapeutic at 3.333 on 08/26/2023.  She is not on thyroid  supplement at this time.

## 2024-06-01 NOTE — Patient Instructions (Signed)
 See assessment and plan under each diagnosis in the problem list and acutely for this visit

## 2024-06-01 NOTE — Assessment & Plan Note (Signed)
 Mild normochromic, normocytic anemia was present on 08/26/2023 with H/H of 10.9/34.  No bleeding dyscrasias reported by staff.  Continue to monitor.

## 2024-06-01 NOTE — Progress Notes (Signed)
   NURSING HOME LOCATION:  [facility] ROOM NUMBER:  [id number]  CODE STATUS: DNR    PCP: Landy Barnie RAMAN, NP   This is a nursing facility follow up visit  of chronic medical diagnoses to document compliance with Regulation 483.30 (c) in The Long Term Care Survey Manual Phase 2 which mandates caregiver visit ( visits can alternate among physician, PA or NP as per statutes) within 10 days of 30 days / 60 days/ 90 days post admission to SNF date  .  Interim medical record and care since last SNF visit was updated with review of diagnostic studies and change in clinical status since last visit were documented.  HPI: She is a permanent resident of this facility with medical diagnoses of aortic atherosclerosis; history of breast cancer; essential hypertension; hypothyroidism; history of vitamin D  deficiency; and dementia with behavioral disorder. Significant surgeries and procedures include partial right mastectomy & IR PRC cholecystotomy for biliary drainage.  Labs are not current; these were last performed 08/26/2023.  Documented were hypocalcemia with a value of 8.4; albumin 2.7 and total protein low normal at 6.5.  Normochromic, normocytic anemia was present with H/H of 10.9/34.  TSH was therapeutic at 3.333.  Review of systems: Dementia invalidated responses.  She denied any active health problems confabulating no, too soon.  She then confabulated [place]  She could not give me the name of the town in which she grew up; but then began to sing [place] coming around the [place].  Cardiovascular: No chest pain, palpitations, paroxysmal nocturnal dyspnea, claudication, edema  Respiratory: No cough, sputum production, hemoptysis, DOE Gastrointestinal: No  abdominal pain, change in bowels Genitourinary: No dysuria, hematuria, pyuria, incontinence, nocturia Musculoskeletal: No joint stiffness, joint swelling, weakness,  pain Neurologic: No headache,  numbness, tingling  Physical exam:  Pertinent or positive findings: She appears her age.  Exam could not be completed other than visualization of her face and extremities.  She is markedly hard of hearing.  Lower lids are puffy.  There is fine hirsutism over the chin.  She repeatedly refused to let me auscultate her heart or lungs.  Her response was listen to your own.  Interosseous wasting in the hands is noted.   At that point she began to confabulate about who might be in room 107, reading the room number from a distance of about 30 feet without lenses.  At that point I ceased to try to complete an exam.   See summary under each active problem in the Problem List with associated updated therapeutic plan

## 2024-06-01 NOTE — Assessment & Plan Note (Addendum)
 Labs are not current but albumin was 2.7 and total protein low normal at 6.5 on 08/26/2023.  She does have interosseous wasting of the hands.

## 2024-06-01 NOTE — Assessment & Plan Note (Addendum)
 Today she denied any active medical issues and exhibited confabulation.  She repeatedly refused any exam.  The second time I asked if I could listen to her heart her response was listen to your own.  At that point I terminated any attempt at physical exam.

## 2024-06-15 ENCOUNTER — Other Ambulatory Visit (HOSPITAL_COMMUNITY)
Admission: RE | Admit: 2024-06-15 | Discharge: 2024-06-15 | Disposition: A | Discharge status: Home / Self Care | Source: Skilled Nursing Facility | Attending: Adult Health | Admitting: Adult Health

## 2024-06-15 ENCOUNTER — Encounter: Payer: Self-pay | Admitting: Adult Health

## 2024-06-15 ENCOUNTER — Non-Acute Institutional Stay (SKILLED_NURSING_FACILITY): Admitting: Adult Health

## 2024-06-15 DIAGNOSIS — E875 Hyperkalemia: Secondary | ICD-10-CM | POA: Diagnosis not present

## 2024-06-15 DIAGNOSIS — R197 Diarrhea, unspecified: Secondary | ICD-10-CM | POA: Diagnosis not present

## 2024-06-15 DIAGNOSIS — E559 Vitamin D deficiency, unspecified: Secondary | ICD-10-CM | POA: Diagnosis not present

## 2024-06-15 LAB — CBC WITH DIFFERENTIAL/PLATELET
Abs Immature Granulocytes: 0.03 K/uL (ref 0.00–0.07)
Basophils Absolute: 0.1 K/uL (ref 0.0–0.1)
Basophils Relative: 1 %
Eosinophils Absolute: 0.3 K/uL (ref 0.0–0.5)
Eosinophils Relative: 4 %
HCT: 34.2 % — ABNORMAL LOW (ref 36.0–46.0)
Hemoglobin: 10.8 g/dL — ABNORMAL LOW (ref 12.0–15.0)
Immature Granulocytes: 1 %
Lymphocytes Relative: 18 %
Lymphs Abs: 1.1 K/uL (ref 0.7–4.0)
MCH: 29.9 pg (ref 26.0–34.0)
MCHC: 31.6 g/dL (ref 30.0–36.0)
MCV: 94.7 fL (ref 80.0–100.0)
Monocytes Absolute: 0.8 K/uL (ref 0.1–1.0)
Monocytes Relative: 12 %
Neutro Abs: 3.9 K/uL (ref 1.7–7.7)
Neutrophils Relative %: 64 %
Platelets: 271 K/uL (ref 150–400)
RBC: 3.61 MIL/uL — ABNORMAL LOW (ref 3.87–5.11)
RDW: 15.9 % — ABNORMAL HIGH (ref 11.5–15.5)
WBC: 6.1 K/uL (ref 4.0–10.5)
nRBC: 0 % (ref 0.0–0.2)

## 2024-06-15 LAB — BASIC METABOLIC PANEL WITH GFR
Anion gap: 9 (ref 5–15)
BUN: 20 mg/dL (ref 8–23)
CO2: 25 mmol/L (ref 22–32)
Calcium: 8.7 mg/dL — ABNORMAL LOW (ref 8.9–10.3)
Chloride: 104 mmol/L (ref 98–111)
Creatinine, Ser: 1 mg/dL (ref 0.44–1.00)
GFR, Estimated: 52 mL/min — ABNORMAL LOW (ref >60)
Glucose, Bld: 96 mg/dL (ref 70–99)
Potassium: 5.2 mmol/L — ABNORMAL HIGH (ref 3.5–5.1)
Sodium: 138 mmol/L (ref 135–145)

## 2024-06-15 NOTE — Progress Notes (Unsigned)
 Location:  [facility] Room Number: 109 Place of Service:  SNF (31)   CODE STATUS: dnr   No Known Allergies  Chief Complaint  Patient presents with   Acute Visit    Diarrhea     HPI:  She tells me that she has had diarrhea for a long time. The staff report that the diarrhea has been for the past 4 days and has had 4 stools today. She will decline medications. She declined imodium earlier today. She denies any abdominal pain. No nausea or vomiting.  Her appetite is without change. There has been no blood in her stool.   Past Medical History:  Diagnosis Date   Aortic atherosclerosis (HCC) 11/15/2020   Breast cancer (HCC)    Cognitive communication deficit    Dementia (HCC)    Difficulty in walking(719.7)    Fall    HTN (hypertension) 08/26/2016   Muscle weakness (generalized)    Rash and nonspecific skin eruption    Thyroid  disease    hypo   Urinary tract infection, site not specified    Vitamin B deficiency     Past Surgical History:  Procedure Laterality Date   COLON SURGERY     IR CATHETER TUBE CHANGE  05/07/2017   IR EXCHANGE BILIARY DRAIN  07/02/2017   IR EXCHANGE BILIARY DRAIN  07/10/2017   IR EXCHANGE BILIARY DRAIN  08/28/2017   IR EXCHANGE BILIARY DRAIN  10/11/2017   IR EXCHANGE BILIARY DRAIN  10/28/2017   IR EXCHANGE BILIARY DRAIN  12/23/2017   IR EXCHANGE BILIARY DRAIN  01/23/2018   IR EXCHANGE BILIARY DRAIN  02/17/2018   IR EXCHANGE BILIARY DRAIN  03/20/2018   IR EXCHANGE BILIARY DRAIN  04/17/2018   IR EXCHANGE BILIARY DRAIN  05/29/2018   IR EXCHANGE BILIARY DRAIN  07/24/2018   IR EXCHANGE BILIARY DRAIN  09/22/2018   IR EXCHANGE BILIARY DRAIN  11/20/2018   IR EXCHANGE BILIARY DRAIN  02/11/2019   IR EXCHANGE BILIARY DRAIN  04/14/2019   IR EXCHANGE BILIARY DRAIN  06/09/2019   IR EXCHANGE BILIARY DRAIN  11/26/2019   IR EXCHANGE BILIARY DRAIN  12/23/2019   IR EXCHANGE BILIARY DRAIN  02/22/2020   IR EXCHANGE BILIARY DRAIN  05/25/2020   IR EXCHANGE BILIARY  DRAIN  07/29/2020   IR EXCHANGE BILIARY DRAIN  08/30/2020   IR EXCHANGE BILIARY DRAIN  10/06/2020   IR EXCHANGE BILIARY DRAIN  11/18/2020   IR EXCHANGE BILIARY DRAIN  12/21/2020   IR EXCHANGE BILIARY DRAIN  02/03/2021   IR EXCHANGE BILIARY DRAIN  03/07/2021   IR EXCHANGE BILIARY DRAIN  05/04/2021   IR EXCHANGE BILIARY DRAIN  06/22/2021   IR EXCHANGE BILIARY DRAIN  07/04/2021   IR EXCHANGE BILIARY DRAIN  08/10/2021   IR PERC CHOLECYSTOSTOMY  01/27/2017   IR PERC CHOLECYSTOSTOMY  11/18/2019   IR RADIOLOGIST EVAL & MGMT  03/05/2017   IR RADIOLOGIST EVAL & MGMT  01/23/2023   IR SINUS/FIST TUBE CHK-NON GI  10/06/2020   MASTECTOMY, PARTIAL Right     Social History   Socioeconomic History   Marital status: Widowed    Spouse name: Not on file   Number of children: Not on file   Years of education: Not on file   Highest education level: Not on file  Occupational History   Occupation: retired  Tobacco Use   Smoking status: Never   Smokeless tobacco: Never  Vaping Use   Vaping status: Never Used  Substance and Sexual  Activity   Alcohol use: No   Drug use: No   Sexual activity: Not Currently  Other Topics Concern   Not on file  Social History Narrative   Long term resident of [facility]    Social Drivers of Health   Financial Resource Strain: Low Risk  (07/21/2018)   Overall Financial Resource Strain (CARDIA)    Difficulty of Paying Living Expenses: Not hard at all  Food Insecurity: No Food Insecurity (07/21/2018)   Hunger Vital Sign    Worried About Running Out of Food in the Last Year: Never true    Ran Out of Food in the Last Year: Never true  Transportation Needs: No Transportation Needs (07/21/2018)   PRAPARE - Administrator, Civil Service (Medical): No    Lack of Transportation (Non-Medical): No  Physical Activity: Inactive (07/21/2018)   Exercise Vital Sign    Days of Exercise per Week: 0 days    Minutes of Exercise per Session: 0 min  Stress: No Stress Concern Present  (07/21/2018)   [employer] of Occupational Health - Occupational Stress Questionnaire    Feeling of Stress : Not at all  Social Connections: Socially Isolated (07/21/2018)   Social Connection and Isolation Panel    Frequency of Communication with Friends and Family: Never    Frequency of Social Gatherings with Friends and Family: Never    Attends Religious Services: Never    Database administrator or Organizations: No    Attends Banker Meetings: Never    Marital Status: Widowed  Intimate Partner Violence: Not At Risk (07/21/2018)   Humiliation, Afraid, Rape, and Kick questionnaire    Fear of Current or Ex-Partner: No    Emotionally Abused: No    Physically Abused: No    Sexually Abused: No   Family History  Problem Relation Age of Onset   Cancer Father        ? primary   Diabetes Neg Hx    Heart disease Neg Hx    Stroke Neg Hx       VITAL SIGNS BP 132/68   Pulse 66   Temp 98.2 F (36.8 C)   Resp 18   Ht 5' 6 (1.676 m)   Wt 134 lb 6.4 oz (61 kg)   SpO2 95%   BMI 21.69 kg/m   Outpatient Encounter Medications as of 06/15/2024  Medication Sig   acetaminophen  (TYLENOL ) 325 MG tablet Take 650 mg by mouth every 6 (six) hours as needed for mild pain. (Patient not taking: Reported on 04/17/2024)   ALPRAZolam  (XANAX ) 0.5 MG tablet Take 1 tablet (0.5 mg total) by mouth daily as needed for anxiety.   donepezil  (ARICEPT ) 5 MG tablet Take 5 mg by mouth at bedtime.   No facility-administered encounter medications on file as of 06/15/2024.     SIGNIFICANT DIAGNOSTIC EXAMS  LABS REVIEWED: PREVIOUS:       08-26-23: wbc 5.3; hgb 10.9; hct 34.0; mcv 96.3 plt 288; glucose 79; bun 13; creat 0.85; k+ 3.9; na++ 138; ca 8.4; gfr>60 protein 6.5 albumin 2.7   TODAY  06-15-24: wbc 6.1; hgb 10.8; hct 34.2; mcv 94.7 plt 271; glucose 96; bun 20; creat 1.00; k+ 5.2; na++ 138; ca 8.7 gfr 52   Review of Systems  Constitutional:  Negative for malaise/fatigue.  Respiratory:   Negative for cough and shortness of breath.   Cardiovascular:  Negative for chest pain, palpitations and leg swelling.  Gastrointestinal:  Positive for diarrhea. Negative for  abdominal pain, blood in stool, heartburn, melena, nausea and vomiting.  Musculoskeletal:  Negative for back pain, joint pain and myalgias.  Skin: Negative.   Neurological:  Negative for dizziness.  Psychiatric/Behavioral:  The patient is not nervous/anxious.    Physical Exam Constitutional:      General: She is not in acute distress.    Appearance: She is well-developed. She is not diaphoretic.  Neck:     Thyroid : No thyromegaly.  Cardiovascular:     Rate and Rhythm: Normal rate and regular rhythm.     Heart sounds: Murmur heard.  Pulmonary:     Effort: Pulmonary effort is normal. No respiratory distress.     Breath sounds: Normal breath sounds.  Abdominal:     General: Bowel sounds are normal. There is no distension.     Palpations: Abdomen is soft.     Tenderness: There is no abdominal tenderness.  Musculoskeletal:        General: Normal range of motion.     Cervical back: Neck supple.     Right lower leg: No edema.     Left lower leg: No edema.  Lymphadenopathy:     Cervical: No cervical adenopathy.  Skin:    General: Skin is warm and dry.  Neurological:     Mental Status: She is alert. Mental status is at baseline.  Psychiatric:        Mood and Affect: Mood normal.      ASSESSMENT/ PLAN:  TODAY  Unspecified diarrhea Hyperkalemia  She has taken one dose of imodium Will give lokelma 10 gm today  Will continue to monitor her status.    Barnie Seip NP [facility]   call [phone]

## 2024-06-16 DIAGNOSIS — R197 Diarrhea, unspecified: Secondary | ICD-10-CM | POA: Insufficient documentation

## 2024-06-16 DIAGNOSIS — E875 Hyperkalemia: Secondary | ICD-10-CM | POA: Insufficient documentation

## 2024-07-07 ENCOUNTER — Non-Acute Institutional Stay (SKILLED_NURSING_FACILITY): Payer: Self-pay | Admitting: Adult Health

## 2024-07-07 ENCOUNTER — Encounter: Payer: Self-pay | Admitting: Adult Health

## 2024-07-07 DIAGNOSIS — F0390 Unspecified dementia without behavioral disturbance: Secondary | ICD-10-CM | POA: Diagnosis not present

## 2024-07-07 DIAGNOSIS — D649 Anemia, unspecified: Secondary | ICD-10-CM | POA: Diagnosis not present

## 2024-07-07 DIAGNOSIS — I700 Atherosclerosis of aorta: Secondary | ICD-10-CM

## 2024-07-07 NOTE — Progress Notes (Signed)
 Location:  [facility] Room Number: 110/D Place of Service:  SNF (31)   CODE STATUS: DNR  No Known Allergies  Chief Complaint  Patient presents with   Medical Management of Chronic Issues             Chronic anemia:   Aortic atherosclerosis    Major neurocognitive disorder    HPI:  She is a 89 y.o. long term resident of this facility being seen for the management of her chronic illnesses:  Chronic anemia:   Aortic atherosclerosis    Major neurocognitive disorder.  Her weight is stable; she has not had any further diarrhea. No reports of uncontrolled pain.     Past Medical History:  Diagnosis Date   Aortic atherosclerosis (HCC) 11/15/2020   Breast cancer (HCC)    Cognitive communication deficit    Dementia (HCC)    Difficulty in walking(719.7)    Fall    HTN (hypertension) 08/26/2016   Muscle weakness (generalized)    Rash and nonspecific skin eruption    Thyroid  disease    hypo   Urinary tract infection, site not specified    Vitamin B deficiency     Past Surgical History:  Procedure Laterality Date   COLON SURGERY     IR CATHETER TUBE CHANGE  05/07/2017   IR EXCHANGE BILIARY DRAIN  07/02/2017   IR EXCHANGE BILIARY DRAIN  07/10/2017   IR EXCHANGE BILIARY DRAIN  08/28/2017   IR EXCHANGE BILIARY DRAIN  10/11/2017   IR EXCHANGE BILIARY DRAIN  10/28/2017   IR EXCHANGE BILIARY DRAIN  12/23/2017   IR EXCHANGE BILIARY DRAIN  01/23/2018   IR EXCHANGE BILIARY DRAIN  02/17/2018   IR EXCHANGE BILIARY DRAIN  03/20/2018   IR EXCHANGE BILIARY DRAIN  04/17/2018   IR EXCHANGE BILIARY DRAIN  05/29/2018   IR EXCHANGE BILIARY DRAIN  07/24/2018   IR EXCHANGE BILIARY DRAIN  09/22/2018   IR EXCHANGE BILIARY DRAIN  11/20/2018   IR EXCHANGE BILIARY DRAIN  02/11/2019   IR EXCHANGE BILIARY DRAIN  04/14/2019   IR EXCHANGE BILIARY DRAIN  06/09/2019   IR EXCHANGE BILIARY DRAIN  11/26/2019   IR EXCHANGE BILIARY DRAIN  12/23/2019   IR EXCHANGE BILIARY DRAIN  02/22/2020   IR EXCHANGE BILIARY  DRAIN  05/25/2020   IR EXCHANGE BILIARY DRAIN  07/29/2020   IR EXCHANGE BILIARY DRAIN  08/30/2020   IR EXCHANGE BILIARY DRAIN  10/06/2020   IR EXCHANGE BILIARY DRAIN  11/18/2020   IR EXCHANGE BILIARY DRAIN  12/21/2020   IR EXCHANGE BILIARY DRAIN  02/03/2021   IR EXCHANGE BILIARY DRAIN  03/07/2021   IR EXCHANGE BILIARY DRAIN  05/04/2021   IR EXCHANGE BILIARY DRAIN  06/22/2021   IR EXCHANGE BILIARY DRAIN  07/04/2021   IR EXCHANGE BILIARY DRAIN  08/10/2021   IR PERC CHOLECYSTOSTOMY  01/27/2017   IR PERC CHOLECYSTOSTOMY  11/18/2019   IR RADIOLOGIST EVAL & MGMT  03/05/2017   IR RADIOLOGIST EVAL & MGMT  01/23/2023   IR SINUS/FIST TUBE CHK-NON GI  10/06/2020   MASTECTOMY, PARTIAL Right     Social History   Socioeconomic History   Marital status: Widowed    Spouse name: Not on file   Number of children: Not on file   Years of education: Not on file   Highest education level: Not on file  Occupational History   Occupation: retired  Tobacco Use   Smoking status: Never   Smokeless tobacco: Never  Vaping Use  Vaping status: Never Used  Substance and Sexual Activity   Alcohol use: No   Drug use: No   Sexual activity: Not Currently  Other Topics Concern   Not on file  Social History Narrative   Long term resident of [facility]    Social Drivers of Health   Financial Resource Strain: Low Risk  (07/21/2018)   Overall Financial Resource Strain (CARDIA)    Difficulty of Paying Living Expenses: Not hard at all  Food Insecurity: No Food Insecurity (07/21/2018)   Hunger Vital Sign    Worried About Running Out of Food in the Last Year: Never true    Ran Out of Food in the Last Year: Never true  Transportation Needs: No Transportation Needs (07/21/2018)   PRAPARE - Administrator, Civil Service (Medical): No    Lack of Transportation (Non-Medical): No  Physical Activity: Inactive (07/21/2018)   Exercise Vital Sign    Days of Exercise per Week: 0 days    Minutes of Exercise per Session: 0 min   Stress: No Stress Concern Present (07/21/2018)   [employer] of Occupational Health - Occupational Stress Questionnaire    Feeling of Stress : Not at all  Social Connections: Socially Isolated (07/21/2018)   Social Connection and Isolation Panel    Frequency of Communication with Friends and Family: Never    Frequency of Social Gatherings with Friends and Family: Never    Attends Religious Services: Never    Database administrator or Organizations: No    Attends Banker Meetings: Never    Marital Status: Widowed  Intimate Partner Violence: Not At Risk (07/21/2018)   Humiliation, Afraid, Rape, and Kick questionnaire    Fear of Current or Ex-Partner: No    Emotionally Abused: No    Physically Abused: No    Sexually Abused: No   Family History  Problem Relation Age of Onset   Cancer Father        ? primary   Diabetes Neg Hx    Heart disease Neg Hx    Stroke Neg Hx       VITAL SIGNS BP 128/64   Pulse 72   Temp 98.1 F (36.7 C)   Resp 20   Ht 5' 6 (1.676 m)   Wt 134 lb 6.4 oz (61 kg)   SpO2 98%   BMI 21.69 kg/m   Outpatient Encounter Medications as of 07/07/2024  Medication Sig   ALPRAZolam  (XANAX ) 0.5 MG tablet Take 1 tablet (0.5 mg total) by mouth daily as needed for anxiety.   donepezil  (ARICEPT ) 5 MG tablet Take 5 mg by mouth at bedtime.   loperamide (IMODIUM A-D) 2 MG tablet Take 2 mg by mouth every 6 (six) hours as needed for diarrhea or loose stools.   acetaminophen  (TYLENOL ) 325 MG tablet Take 650 mg by mouth every 6 (six) hours as needed for mild pain. (Patient not taking: Reported on 07/07/2024)   No facility-administered encounter medications on file as of 07/07/2024.     SIGNIFICANT DIAGNOSTIC EXAMS  LABS REVIEWED: PREVIOUS:       08-26-23: wbc 5.3; hgb 10.9; hct 34.0; mcv 96.3 plt 288; glucose 79; bun 13; creat 0.85; k+ 3.9; na++ 138; ca 8.4; gfr>60 protein 6.5 albumin 2.7   TODAY  06-15-24: wbc 6.1; hgb 10.8; hct 34.2; mcv 94.7 plt  271; glucose 96; bun 20; creat 1.00; k+ 5.2; na++ 138; ca 8.7; gfr 52     Review of Systems  Constitutional:  Negative for malaise/fatigue.  Respiratory:  Negative for cough and shortness of breath.   Cardiovascular:  Negative for chest pain, palpitations and leg swelling.  Gastrointestinal:  Negative for abdominal pain, constipation and heartburn.  Musculoskeletal:  Negative for back pain, joint pain and myalgias.  Skin: Negative.   Neurological:  Negative for dizziness.  Psychiatric/Behavioral:  The patient is not nervous/anxious.    Physical Exam Constitutional:      General: She is not in acute distress.    Appearance: She is well-developed. She is not diaphoretic.  Neck:     Thyroid : No thyromegaly.  Cardiovascular:     Rate and Rhythm: Normal rate and regular rhythm.     Pulses: Normal pulses.     Heart sounds: Murmur heard.  Pulmonary:     Effort: Pulmonary effort is normal. No respiratory distress.     Breath sounds: Normal breath sounds.  Abdominal:     General: Bowel sounds are normal. There is no distension.     Palpations: Abdomen is soft.     Tenderness: There is no abdominal tenderness.  Musculoskeletal:        General: Normal range of motion.     Cervical back: Neck supple.     Right lower leg: No edema.     Left lower leg: No edema.  Lymphadenopathy:     Cervical: No cervical adenopathy.  Skin:    General: Skin is warm and dry.  Neurological:     Mental Status: She is alert. Mental status is at baseline.  Psychiatric:        Mood and Affect: Mood normal.        ASSESSMENT/ PLAN:  TODAY  Chronic anemia: hgb 10.8  2. Aortic atherosclerosis (ct 10-25-18) not on statin due to advanced age  21. Major neurocognitive disorder: weight is 134 pounds; is on aricept  5 mg nightly   PREVIOUS   4. Vascular dementia, severe, without behavioral disturbance, psychotic disturbance, mood disturbance, and anxiety (HCC)  weight is 134 pounds; on aricept  5 mg daily    5. Primary hypertension: b/p 128/64  6. Calculus of gall bladder without acute cholecystitis and obstruction: her drain remains out; no pain present.   7. Chronic constipation: will monitor   8. Post menopausal osteoporosis: t score -2.646  9. Herpes: without outbreak  10. Protein calorie malnutrition: albumin 3.0 will continue supplements as directed.     Barnie Seip NP [facility]   call [phone]

## 2024-07-17 ENCOUNTER — Non-Acute Institutional Stay (SKILLED_NURSING_FACILITY): Payer: Self-pay | Admitting: Adult Health

## 2024-07-17 ENCOUNTER — Encounter: Payer: Self-pay | Admitting: Adult Health

## 2024-07-17 DIAGNOSIS — F0390 Unspecified dementia without behavioral disturbance: Secondary | ICD-10-CM

## 2024-07-17 DIAGNOSIS — I700 Atherosclerosis of aorta: Secondary | ICD-10-CM

## 2024-07-17 DIAGNOSIS — D649 Anemia, unspecified: Secondary | ICD-10-CM | POA: Diagnosis not present

## 2024-07-17 NOTE — Progress Notes (Signed)
 Location:  [facility] Room Number: 110-D Place of Service:  SNF (31)   CODE STATUS: DNR  No Known Allergies  Chief Complaint  Patient presents with   Care Plan Meeting     HPI:  We have come together for her care plan meeting. BIMS 3/15 mood 0/30. She does ambulate with a walker without falls. She requires max assist with bathing but is independent with her other adl care. She is frequently incontinent of bladder and bowel. Dietary: mech soft D3 feeds self; appetite 76-100% weight is 134.4 pounds. Therapy: none at this time. Activities: does participate. She will continue to be followed for her chronic illnesses including: Normochromic normocytic anemia   Aortic atherosclerosis   Major neurocognitive disorder  Past Medical History:  Diagnosis Date   Aortic atherosclerosis 11/15/2020   Breast cancer (HCC)    Cognitive communication deficit    Dementia (HCC)    Difficulty in walking(719.7)    Fall    HTN (hypertension) 08/26/2016   Muscle weakness (generalized)    Rash and nonspecific skin eruption    Thyroid  disease    hypo   Urinary tract infection, site not specified    Vitamin B deficiency     Past Surgical History:  Procedure Laterality Date   COLON SURGERY     IR CATHETER TUBE CHANGE  05/07/2017   IR EXCHANGE BILIARY DRAIN  07/02/2017   IR EXCHANGE BILIARY DRAIN  07/10/2017   IR EXCHANGE BILIARY DRAIN  08/28/2017   IR EXCHANGE BILIARY DRAIN  10/11/2017   IR EXCHANGE BILIARY DRAIN  10/28/2017   IR EXCHANGE BILIARY DRAIN  12/23/2017   IR EXCHANGE BILIARY DRAIN  01/23/2018   IR EXCHANGE BILIARY DRAIN  02/17/2018   IR EXCHANGE BILIARY DRAIN  03/20/2018   IR EXCHANGE BILIARY DRAIN  04/17/2018   IR EXCHANGE BILIARY DRAIN  05/29/2018   IR EXCHANGE BILIARY DRAIN  07/24/2018   IR EXCHANGE BILIARY DRAIN  09/22/2018   IR EXCHANGE BILIARY DRAIN  11/20/2018   IR EXCHANGE BILIARY DRAIN  02/11/2019   IR EXCHANGE BILIARY DRAIN  04/14/2019   IR EXCHANGE BILIARY DRAIN   06/09/2019   IR EXCHANGE BILIARY DRAIN  11/26/2019   IR EXCHANGE BILIARY DRAIN  12/23/2019   IR EXCHANGE BILIARY DRAIN  02/22/2020   IR EXCHANGE BILIARY DRAIN  05/25/2020   IR EXCHANGE BILIARY DRAIN  07/29/2020   IR EXCHANGE BILIARY DRAIN  08/30/2020   IR EXCHANGE BILIARY DRAIN  10/06/2020   IR EXCHANGE BILIARY DRAIN  11/18/2020   IR EXCHANGE BILIARY DRAIN  12/21/2020   IR EXCHANGE BILIARY DRAIN  02/03/2021   IR EXCHANGE BILIARY DRAIN  03/07/2021   IR EXCHANGE BILIARY DRAIN  05/04/2021   IR EXCHANGE BILIARY DRAIN  06/22/2021   IR EXCHANGE BILIARY DRAIN  07/04/2021   IR EXCHANGE BILIARY DRAIN  08/10/2021   IR PERC CHOLECYSTOSTOMY  01/27/2017   IR PERC CHOLECYSTOSTOMY  11/18/2019   IR RADIOLOGIST EVAL & MGMT  03/05/2017   IR RADIOLOGIST EVAL & MGMT  01/23/2023   IR SINUS/FIST TUBE CHK-NON GI  10/06/2020   MASTECTOMY, PARTIAL Right     Social History   Socioeconomic History   Marital status: Widowed    Spouse name: Not on file   Number of children: Not on file   Years of education: Not on file   Highest education level: Not on file  Occupational History   Occupation: retired  Tobacco Use   Smoking status: Never  Smokeless tobacco: Never  Vaping Use   Vaping status: Never Used  Substance and Sexual Activity   Alcohol use: No   Drug use: No   Sexual activity: Not Currently  Other Topics Concern   Not on file  Social History Narrative   Long term resident of [facility]    Social Drivers of Health   Financial Resource Strain: Low Risk  (07/21/2018)   Overall Financial Resource Strain (CARDIA)    Difficulty of Paying Living Expenses: Not hard at all  Food Insecurity: No Food Insecurity (07/21/2018)   Hunger Vital Sign    Worried About Running Out of Food in the Last Year: Never true    Ran Out of Food in the Last Year: Never true  Transportation Needs: No Transportation Needs (07/21/2018)   PRAPARE - Administrator, Civil Service (Medical): No    Lack of Transportation (Non-Medical): No   Physical Activity: Inactive (07/21/2018)   Exercise Vital Sign    Days of Exercise per Week: 0 days    Minutes of Exercise per Session: 0 min  Stress: No Stress Concern Present (07/21/2018)   [employer] of Occupational Health - Occupational Stress Questionnaire    Feeling of Stress : Not at all  Social Connections: Socially Isolated (07/21/2018)   Social Connection and Isolation Panel    Frequency of Communication with Friends and Family: Never    Frequency of Social Gatherings with Friends and Family: Never    Attends Religious Services: Never    Database administrator or Organizations: No    Attends Banker Meetings: Never    Marital Status: Widowed  Intimate Partner Violence: Not At Risk (07/21/2018)   Humiliation, Afraid, Rape, and Kick questionnaire    Fear of Current or Ex-Partner: No    Emotionally Abused: No    Physically Abused: No    Sexually Abused: No   Family History  Problem Relation Age of Onset   Cancer Father        ? primary   Diabetes Neg Hx    Heart disease Neg Hx    Stroke Neg Hx       VITAL SIGNS BP 128/64   Pulse 72   Ht 5' 6 (1.676 m)   Wt 134 lb 6.4 oz (61 kg)   BMI 21.69 kg/m   Outpatient Encounter Medications as of 07/17/2024  Medication Sig   ALPRAZolam  (XANAX ) 0.5 MG tablet Take 1 tablet (0.5 mg total) by mouth daily as needed for anxiety.   donepezil  (ARICEPT ) 5 MG tablet Take 5 mg by mouth at bedtime.   loperamide (IMODIUM A-D) 2 MG tablet Take 2 mg by mouth every 6 (six) hours as needed for diarrhea or loose stools.   acetaminophen  (TYLENOL ) 325 MG tablet Take 650 mg by mouth every 6 (six) hours as needed for mild pain. (Patient not taking: Reported on 07/17/2024)   No facility-administered encounter medications on file as of 07/17/2024.     SIGNIFICANT DIAGNOSTIC EXAMS  LABS REVIEWED: PREVIOUS:       08-26-23: wbc 5.3; hgb 10.9; hct 34.0; mcv 96.3 plt 288; glucose 79; bun 13; creat 0.85; k+ 3.9; na++ 138; ca 8.4;  gfr>60 protein 6.5 albumin 2.7   TODAY  06-15-24: wbc 6.1; hgb 10.8; hct 34.2; mcv 94.7 plt 271; glucose 96; bun 20; creat 1.00; k+ 5.2; na++ 138; ca 8.7; gfr 52    Review of Systems  Constitutional:  Negative for malaise/fatigue.  Respiratory:  Negative  for cough and shortness of breath.   Cardiovascular:  Negative for chest pain, palpitations and leg swelling.  Gastrointestinal:  Negative for abdominal pain, constipation and heartburn.  Musculoskeletal:  Negative for back pain, joint pain and myalgias.  Skin: Negative.   Neurological:  Negative for dizziness.  Psychiatric/Behavioral:  The patient is not nervous/anxious.     Physical Exam Constitutional:      General: She is not in acute distress.    Appearance: She is well-developed. She is not diaphoretic.  Neck:     Thyroid : No thyromegaly.  Cardiovascular:     Rate and Rhythm: Normal rate and regular rhythm.     Heart sounds: Murmur heard.  Pulmonary:     Effort: Pulmonary effort is normal. No respiratory distress.     Breath sounds: Normal breath sounds.  Abdominal:     General: Bowel sounds are normal. There is no distension.     Palpations: Abdomen is soft.     Tenderness: There is no abdominal tenderness.  Musculoskeletal:        General: Normal range of motion.     Cervical back: Neck supple.     Right lower leg: No edema.     Left lower leg: No edema.  Lymphadenopathy:     Cervical: No cervical adenopathy.  Skin:    General: Skin is warm and dry.  Neurological:     Mental Status: She is alert. Mental status is at baseline.  Psychiatric:        Mood and Affect: Mood normal.       ASSESSMENT/ PLAN:  TODAY  Normochromic normocytic anemia Aortic atherosclerosis Major neurocognitive disorder   Will continue current medications Will continue current plan of care Will continue to monitor her status  Time spend with patient: 40 minutes: medications; activities; dietary.    Barnie Seip  NP [facility]   call [phone]

## 2024-07-22 ENCOUNTER — Encounter: Payer: Self-pay | Admitting: Adult Health

## 2024-07-22 ENCOUNTER — Non-Acute Institutional Stay (SKILLED_NURSING_FACILITY): Admitting: Adult Health

## 2024-07-22 DIAGNOSIS — Z0000 Encounter for general adult medical examination without abnormal findings: Secondary | ICD-10-CM

## 2024-07-22 NOTE — Progress Notes (Signed)
 Subjective:   Cynthia [name] is a 89 y.o. female who presents for Medicare Annual (Subsequent) preventive examination.  Visit Complete: In person  Patient Medicare AWV questionnaire was completed by the patient on 07-22-2024; I have confirmed that all information answered by patient is correct and no changes since this date.  Cardiac Risk Factors include: advanced age (>84men, >26 women);sedentary lifestyle     Objective:    Today's Vitals   07/22/24 1132  BP: (!) 108/54  Pulse: 86  Resp: 20  Temp: (!) 97.4 F (36.3 C)  SpO2: 97%  Weight: 134 lb 6.4 oz (61 kg)  Height: 5' 6 (1.676 m)   Body mass index is 21.69 kg/m.     07/17/2024   10:04 AM 07/07/2024   10:48 AM 04/17/2024    1:49 PM 12/13/2023   11:58 AM 08/02/2023    2:57 PM 02/12/2023    9:01 AM 01/23/2023    4:31 PM  Advanced Directives  Does Patient Have a Medical Advance Directive? Yes Yes Yes Yes Yes Yes Yes  Type of Advance Directive Out of facility DNR (pink MOST or yellow form) Out of facility DNR (pink MOST or yellow form) Out of facility DNR (pink MOST or yellow form) Out of facility DNR (pink MOST or yellow form) Out of facility DNR (pink MOST or yellow form) Out of facility DNR (pink MOST or yellow form) Out of facility DNR (pink MOST or yellow form)  Does patient want to make changes to medical advance directive? No - Patient declined No - Patient declined No - Patient declined No - Patient declined No - Patient declined No - Patient declined No - Patient declined  Pre-existing out of facility DNR order (yellow form or pink MOST form) Pink MOST form placed in chart (order not valid for inpatient use);Yellow form placed in chart (order not valid for inpatient use) Pink MOST form placed in chart (order not valid for inpatient use);Yellow form placed in chart (order not valid for inpatient use)  Pink MOST/Yellow Form most recent copy in chart - Physician notified to receive inpatient order Yellow form placed in  chart (order not valid for inpatient use)      Current Medications (verified) Outpatient Encounter Medications as of 07/22/2024  Medication Sig   acetaminophen  (TYLENOL ) 325 MG tablet Take 650 mg by mouth every 6 (six) hours as needed for mild pain. (Patient not taking: Reported on 07/17/2024)   ALPRAZolam  (XANAX ) 0.5 MG tablet Take 1 tablet (0.5 mg total) by mouth daily as needed for anxiety.   donepezil  (ARICEPT ) 5 MG tablet Take 5 mg by mouth at bedtime.   loperamide (IMODIUM A-D) 2 MG tablet Take 2 mg by mouth every 6 (six) hours as needed for diarrhea or loose stools.   No facility-administered encounter medications on file as of 07/22/2024.    Allergies (verified) Patient has no known allergies.   History: Past Medical History:  Diagnosis Date   Aortic atherosclerosis 11/15/2020   Breast cancer (HCC)    Cognitive communication deficit    Dementia (HCC)    Difficulty in walking(719.7)    Fall    HTN (hypertension) 08/26/2016   Muscle weakness (generalized)    Rash and nonspecific skin eruption    Thyroid  disease    hypo   Urinary tract infection, site not specified    Vitamin B deficiency    Past Surgical History:  Procedure Laterality Date   COLON SURGERY     IR CATHETER TUBE  CHANGE  05/07/2017   IR EXCHANGE BILIARY DRAIN  07/02/2017   IR EXCHANGE BILIARY DRAIN  07/10/2017   IR EXCHANGE BILIARY DRAIN  08/28/2017   IR EXCHANGE BILIARY DRAIN  10/11/2017   IR EXCHANGE BILIARY DRAIN  10/28/2017   IR EXCHANGE BILIARY DRAIN  12/23/2017   IR EXCHANGE BILIARY DRAIN  01/23/2018   IR EXCHANGE BILIARY DRAIN  02/17/2018   IR EXCHANGE BILIARY DRAIN  03/20/2018   IR EXCHANGE BILIARY DRAIN  04/17/2018   IR EXCHANGE BILIARY DRAIN  05/29/2018   IR EXCHANGE BILIARY DRAIN  07/24/2018   IR EXCHANGE BILIARY DRAIN  09/22/2018   IR EXCHANGE BILIARY DRAIN  11/20/2018   IR EXCHANGE BILIARY DRAIN  02/11/2019   IR EXCHANGE BILIARY DRAIN  04/14/2019   IR EXCHANGE BILIARY DRAIN  06/09/2019   IR EXCHANGE  BILIARY DRAIN  11/26/2019   IR EXCHANGE BILIARY DRAIN  12/23/2019   IR EXCHANGE BILIARY DRAIN  02/22/2020   IR EXCHANGE BILIARY DRAIN  05/25/2020   IR EXCHANGE BILIARY DRAIN  07/29/2020   IR EXCHANGE BILIARY DRAIN  08/30/2020   IR EXCHANGE BILIARY DRAIN  10/06/2020   IR EXCHANGE BILIARY DRAIN  11/18/2020   IR EXCHANGE BILIARY DRAIN  12/21/2020   IR EXCHANGE BILIARY DRAIN  02/03/2021   IR EXCHANGE BILIARY DRAIN  03/07/2021   IR EXCHANGE BILIARY DRAIN  05/04/2021   IR EXCHANGE BILIARY DRAIN  06/22/2021   IR EXCHANGE BILIARY DRAIN  07/04/2021   IR EXCHANGE BILIARY DRAIN  08/10/2021   IR PERC CHOLECYSTOSTOMY  01/27/2017   IR PERC CHOLECYSTOSTOMY  11/18/2019   IR RADIOLOGIST EVAL & MGMT  03/05/2017   IR RADIOLOGIST EVAL & MGMT  01/23/2023   IR SINUS/FIST TUBE CHK-NON GI  10/06/2020   MASTECTOMY, PARTIAL Right    Family History  Problem Relation Age of Onset   Cancer Father        ? primary   Diabetes Neg Hx    Heart disease Neg Hx    Stroke Neg Hx    Social History   Socioeconomic History   Marital status: Widowed    Spouse name: Not on file   Number of children: Not on file   Years of education: Not on file   Highest education level: Not on file  Occupational History   Occupation: retired  Tobacco Use   Smoking status: Never   Smokeless tobacco: Never  Vaping Use   Vaping status: Never Used  Substance and Sexual Activity   Alcohol use: No   Drug use: No   Sexual activity: Not Currently  Other Topics Concern   Not on file  Social History Narrative   Long term resident of [facility]    Social Drivers of Health   Financial Resource Strain: Low Risk  (07/21/2018)   Overall Financial Resource Strain (CARDIA)    Difficulty of Paying Living Expenses: Not hard at all  Food Insecurity: No Food Insecurity (07/21/2018)   Hunger Vital Sign    Worried About Running Out of Food in the Last Year: Never true    Ran Out of Food in the Last Year: Never true  Transportation Needs: No Transportation Needs  (07/21/2018)   PRAPARE - Administrator, Civil Service (Medical): No    Lack of Transportation (Non-Medical): No  Physical Activity: Inactive (07/21/2018)   Exercise Vital Sign    Days of Exercise per Week: 0 days    Minutes of Exercise per Session: 0 min  Stress:  No Stress Concern Present (07/21/2018)   [employer] of Occupational Health - Occupational Stress Questionnaire    Feeling of Stress : Not at all  Social Connections: Socially Isolated (07/21/2018)   Social Connection and Isolation Panel    Frequency of Communication with Friends and Family: Never    Frequency of Social Gatherings with Friends and Family: Never    Attends Religious Services: Never    Database administrator or Organizations: No    Attends Banker Meetings: Never    Marital Status: Widowed    Tobacco Counseling Counseling given: Not Answered   Clinical Intake:  Pre-visit preparation completed: Yes  Pain : No/denies pain     BMI - recorded: 21.69 Nutritional Status: BMI of 19-24  Normal Nutritional Risks: Unintentional weight loss Diabetes: No  How often do you need to have someone help you when you read instructions, pamphlets, or other written materials from your doctor or pharmacy?: 5 - Always  Interpreter Needed?: No  Comments: long term resident of [facility]   Activities of Daily Living    07/22/2024   11:36 AM  In your present state of health, do you have any difficulty performing the following activities:  Hearing? 0  Vision? 0  Difficulty concentrating or making decisions? 1  Walking or climbing stairs? 1  Dressing or bathing? 1  Doing errands, shopping? 1  Preparing Food and eating ? Y  Using the Toilet? N  In the past six months, have you accidently leaked urine? Y  Do you have problems with loss of bowel control? Y  Managing your Medications? Y  Managing your Finances? Y  Housekeeping or managing your Housekeeping? Y    Patient Care Team: Landy Barnie RAMAN, NP as PCP - [facility] (Geriatric [facility]  Indicate any recent Medical Services you may have received from other than [facility] providers in the past year (date may be approximate).     Assessment:   This is a routine wellness examination for Cynthia [name].  Hearing/Vision screen No results found.   Goals Addressed             This Visit's Progress    DIET - INCREASE WATER INTAKE   On track    Follow up with Provider as scheduled   On track    General - Client will not be readmitted within 30 days ([facility])   On track      Depression Screen    07/22/2024   11:37 AM 11/13/2023    1:30 PM 11/13/2023    1:29 PM 06/17/2023    2:34 PM 02/15/2023    3:31 PM 02/15/2023    3:30 PM 11/21/2022   11:23 AM  PHQ 2/9 Scores  PHQ - 2 Score 0 0 0 0 0 0 0  PHQ- 9 Score  0   0  0    Fall Risk    07/22/2024   11:37 AM 11/13/2023    1:29 PM 06/17/2023    2:34 PM 02/15/2023    3:30 PM 11/21/2022   11:23 AM  Fall Risk   Falls in the past year? 0 0 0 0 0  Number falls in past yr: 0 0 0 0 0  Injury with Fall? 0 0 0 0 0  Risk for fall due to : Impaired balance/gait;Impaired mobility Impaired balance/gait;Impaired mobility Impaired balance/gait;Impaired mobility Impaired balance/gait;Impaired mobility Impaired balance/gait;Impaired mobility  Follow up    Falls evaluation completed  MEDICARE RISK AT HOME: Medicare Risk at Home Any stairs in or around the home?: Yes If so, are there any without handrails?: No Home free of loose throw rugs in walkways, pet beds, electrical cords, etc?: Yes Adequate lighting in your home to reduce risk of falls?: Yes Life alert?: No Use of a cane, walker or w/c?: Yes Grab bars in the bathroom?: Yes Shower chair or bench in shower?: Yes Elevated toilet seat or a handicapped toilet?: No  TIMED UP AND GO:  Was the test performed?  Yes  Length of time to ambulate 10 feet: 15 sec Gait slow and steady with  assistive device    Cognitive Function:    06/17/2023    2:34 PM 06/13/2022   12:42 PM 06/12/2021    3:08 PM  MMSE - Mini Mental State Exam  Not completed: Unable to complete Unable to complete   Orientation to time   3  Orientation to Place   3  Registration   3  Attention/ Calculation   3  Recall   2  Language- name 2 objects   2  Language- repeat   1  Language- follow 3 step command   3  Language- read & follow direction   1  Write a sentence   0  Copy design   0  Total score   21        06/12/2021    3:09 PM 06/06/2020   11:21 AM 07/21/2018   12:35 PM 07/11/2017   12:25 PM  6CIT Screen  What Year? 0 points 0 points 4 points 4 points  What month? 3 points 3 points 0 points 3 points  What time? 3 points 3 points 0 points 0 points  Count back from 20 4 points 4 points 0 points 0 points  Months in reverse 4 points 4 points 4 points 0 points  Repeat phrase 8 points 8 points 6 points 10 points  Total Score 22 points 22 points 14 points 17 points    Immunizations Immunization History  Administered Date(s) Administered    sv, Bivalent, Protein Subunit Rsvpref,pf (Abrysvo) 09/24/2022   Fluad Quad(high Dose 65+) 07/24/2022, 08/19/2023   Fluad Trivalent(High Dose 65+) 08/20/2023   Influenza,inj,Quad PF,6+ Mos 07/26/2021   Influenza-Unspecified 07/28/2014, 07/26/2017, 07/24/2018, 07/27/2019, 07/29/2020   Moderna Covid-19 Vaccine Bivalent Booster 41yrs & up 08/15/2021, 07/30/2023   Moderna SARS-COV2 Booster Vaccination 01/22/2021, 03/13/2022, 08/28/2022   Moderna Sars-Covid-2 Vaccination 10/28/2019, 02/17/2020, 08/25/2020, 01/21/2024   PNEUMOCOCCAL CONJUGATE-20 07/28/2021   Pneumococcal Conjugate-13 06/21/2017   Pneumococcal-Unspecified 08/01/2016   Tdap 07/11/2017   Zoster Recombinant(Shingrix) 11/15/2021, 02/16/2022    TDAP status: Up to date  Flu Vaccine status: Up to date  Pneumococcal vaccine status: Up to date  Covid-19 vaccine status: Completed  vaccines  Qualifies for Shingles Vaccine? No   Zostavax completed No     Screening Tests Health Maintenance  Topic Date Due   Influenza Vaccine  05/22/2024   Medicare Annual Wellness (AWV)  06/16/2024   COVID-19 Vaccine (10 - 2025-26 season) 06/22/2024   DTaP/Tdap/Td (2 - Td or Tdap) 07/12/2027   Pneumococcal Vaccine: 50+ Years  Completed   DEXA SCAN  Completed   Zoster Vaccines- Shingrix  Completed   HPV VACCINES  Aged Out   Meningococcal B Vaccine  Aged Out    Health Maintenance  Health Maintenance Due  Topic Date Due   Influenza Vaccine  05/22/2024   Medicare Annual Wellness (AWV)  06/16/2024   COVID-19 Vaccine (10 -  2025-26 season) 06/22/2024    Colorectal cancer screening: No longer required.   Mammogram status: No longer required due to age.  Bone Density status: Completed 2023. Results reflect: Bone density results: OSTEOPENIA. Repeat every   years.  Lung Cancer Screening: (Low Dose CT Chest recommended if Age 60-80 years, 20 pack-year currently smoking OR have quit w/in 15years.) does not qualify.   Lung Cancer Screening Referral: n/a  Additional Screening:  Hepatitis C Screening: does not qualify; Completed   Vision Screening: Recommended annual ophthalmology exams for early detection of glaucoma and other disorders of the eye. Is the patient up to date with their annual eye exam?  No  Who is the provider or what is the name of the office in which the patient attends annual eye exams?  If pt is not established with a provider, would they like to be referred to a provider to establish care? No .   Dental Screening: Recommended annual dental exams for proper oral hygiene  Diabetic Foot Exam: Diabetic Foot Exam: Completed    Community Resource Referral / Chronic Care Management: CRR required this visit?  No   CCM required this visit?  No     Plan:     I have personally reviewed and noted the following in the patient's chart:   Medical and social  history Use of alcohol, tobacco or illicit drugs  Current medications and supplements including opioid prescriptions. Patient is not currently taking opioid prescriptions. Functional ability and status Nutritional status Physical activity Advanced directives List of other physicians Hospitalizations, surgeries, and ER visits in previous 12 months Vitals Screenings to include cognitive, depression, and falls Referrals and appointments  In addition, I have reviewed and discussed with patient certain preventive protocols, quality metrics, and best practice recommendations. A written personalized care plan for preventive services as well as general preventive health recommendations were provided to patient.     Barnie GORMAN Seip, NP   07/22/2024   After Visit Summary: (In Person-Declined) Patient declined AVS at this time.  Nurse Notes: this exam was performed by myself at this facility. She does have prn ativan  for anxiety which she uses on rare occasion.

## 2024-08-04 ENCOUNTER — Encounter: Payer: Self-pay | Admitting: Adult Health

## 2024-08-04 ENCOUNTER — Non-Acute Institutional Stay (SKILLED_NURSING_FACILITY): Admitting: Adult Health

## 2024-08-04 DIAGNOSIS — I10 Essential (primary) hypertension: Secondary | ICD-10-CM | POA: Diagnosis not present

## 2024-08-04 DIAGNOSIS — K8061 Calculus of gallbladder and bile duct with cholecystitis, unspecified, with obstruction: Secondary | ICD-10-CM

## 2024-08-04 DIAGNOSIS — F01C0 Vascular dementia, severe, without behavioral disturbance, psychotic disturbance, mood disturbance, and anxiety: Secondary | ICD-10-CM | POA: Diagnosis not present

## 2024-08-04 DIAGNOSIS — F0390 Unspecified dementia without behavioral disturbance: Secondary | ICD-10-CM

## 2024-08-04 NOTE — Progress Notes (Signed)
 Location:  [facility] Room Number: 110 Place of Service:  SNF ([id number])   CODE STATUS: dnr  No Known Allergies  Chief Complaint  Patient presents with   Medical Management of Chronic Issues               Vascular dementia, severe, without behavioral disturbance, psychotic disturbance, mood disturbance, and anxiety/major neurocognitive disorder:  Primary hypertension:  Calculus of gall bladder without acute cholecystitis and obstruction:    HPI:  She is a 89 y.o. long term resident of this facility being seen for the management of her chronic illnesses:Vascular dementia, severe, without behavioral disturbance, psychotic disturbance, mood disturbance, and anxiety/major neurocognitive disorder:  Primary hypertension:  Calculus of gall bladder without acute cholecystitis and obstruction. She remains ambulatory with a walker. She continues to decline blood work most times. There are no reports of uncontrolled pain.    Past Medical History:  Diagnosis Date   Aortic atherosclerosis 11/15/2020   Breast cancer (HCC)    Cognitive communication deficit    Dementia (HCC)    Difficulty in walking(719.7)    Fall    HTN (hypertension) 08/26/2016   Muscle weakness (generalized)    Rash and nonspecific skin eruption    Thyroid  disease    hypo   Urinary tract infection, site not specified    Vitamin B deficiency     Past Surgical History:  Procedure Laterality Date   COLON SURGERY     IR CATHETER TUBE CHANGE  05/07/2017   IR EXCHANGE BILIARY DRAIN  07/02/2017   IR EXCHANGE BILIARY DRAIN  07/10/2017   IR EXCHANGE BILIARY DRAIN  08/28/2017   IR EXCHANGE BILIARY DRAIN  10/11/2017   IR EXCHANGE BILIARY DRAIN  10/28/2017   IR EXCHANGE BILIARY DRAIN  12/23/2017   IR EXCHANGE BILIARY DRAIN  01/23/2018   IR EXCHANGE BILIARY DRAIN  02/17/2018   IR EXCHANGE BILIARY DRAIN  03/20/2018   IR EXCHANGE BILIARY DRAIN  04/17/2018   IR EXCHANGE BILIARY DRAIN  05/29/2018   IR EXCHANGE BILIARY  DRAIN  07/24/2018   IR EXCHANGE BILIARY DRAIN  09/22/2018   IR EXCHANGE BILIARY DRAIN  11/20/2018   IR EXCHANGE BILIARY DRAIN  02/11/2019   IR EXCHANGE BILIARY DRAIN  04/14/2019   IR EXCHANGE BILIARY DRAIN  06/09/2019   IR EXCHANGE BILIARY DRAIN  11/26/2019   IR EXCHANGE BILIARY DRAIN  12/23/2019   IR EXCHANGE BILIARY DRAIN  02/22/2020   IR EXCHANGE BILIARY DRAIN  05/25/2020   IR EXCHANGE BILIARY DRAIN  07/29/2020   IR EXCHANGE BILIARY DRAIN  08/30/2020   IR EXCHANGE BILIARY DRAIN  10/06/2020   IR EXCHANGE BILIARY DRAIN  11/18/2020   IR EXCHANGE BILIARY DRAIN  12/21/2020   IR EXCHANGE BILIARY DRAIN  02/03/2021   IR EXCHANGE BILIARY DRAIN  03/07/2021   IR EXCHANGE BILIARY DRAIN  05/04/2021   IR EXCHANGE BILIARY DRAIN  06/22/2021   IR EXCHANGE BILIARY DRAIN  07/04/2021   IR EXCHANGE BILIARY DRAIN  08/10/2021   IR PERC CHOLECYSTOSTOMY  01/27/2017   IR PERC CHOLECYSTOSTOMY  11/18/2019   IR RADIOLOGIST EVAL & MGMT  03/05/2017   IR RADIOLOGIST EVAL & MGMT  01/23/2023   IR SINUS/FIST TUBE CHK-NON GI  10/06/2020   MASTECTOMY, PARTIAL Right     Social History   Socioeconomic History   Marital status: Widowed    Spouse name: Not on file   Number of children: Not on file   Years of education: Not  on file   Highest education level: Not on file  Occupational History   Occupation: retired  Tobacco Use   Smoking status: Never   Smokeless tobacco: Never  Vaping Use   Vaping status: Never Used  Substance and Sexual Activity   Alcohol use: No   Drug use: No   Sexual activity: Not Currently  Other Topics Concern   Not on file  Social History Narrative   Long term resident of [facility]    Social Drivers of Health   Financial Resource Strain: Low Risk  (07/21/2018)   Overall Financial Resource Strain (CARDIA)    Difficulty of Paying Living Expenses: Not hard at all  Food Insecurity: No Food Insecurity (07/21/2018)   Hunger Vital Sign    Worried About Running Out of Food in the Last Year: Never true    Ran Out of  Food in the Last Year: Never true  Transportation Needs: No Transportation Needs (07/21/2018)   PRAPARE - Administrator, Civil Service (Medical): No    Lack of Transportation (Non-Medical): No  Physical Activity: Inactive (07/21/2018)   Exercise Vital Sign    Days of Exercise per Week: 0 days    Minutes of Exercise per Session: 0 min  Stress: No Stress Concern Present (07/21/2018)   [employer] of Occupational Health - Occupational Stress Questionnaire    Feeling of Stress : Not at all  Social Connections: Socially Isolated (07/21/2018)   Social Connection and Isolation Panel    Frequency of Communication with Friends and Family: Never    Frequency of Social Gatherings with Friends and Family: Never    Attends Religious Services: Never    Database administrator or Organizations: No    Attends Banker Meetings: Never    Marital Status: Widowed  Intimate Partner Violence: Not At Risk (07/21/2018)   Humiliation, Afraid, Rape, and Kick questionnaire    Fear of Current or Ex-Partner: No    Emotionally Abused: No    Physically Abused: No    Sexually Abused: No   Family History  Problem Relation Age of Onset   Cancer Father        ? primary   Diabetes Neg Hx    Heart disease Neg Hx    Stroke Neg Hx       VITAL SIGNS BP 136/74   Pulse 74   Temp (!) 97.4 F (36.3 C)   Resp 20   Ht 5' 6 (1.676 m)   Wt 135 lb 3.2 oz (61.3 kg)   SpO2 98%   BMI 21.82 kg/m   Outpatient Encounter Medications as of 08/04/2024  Medication Sig   acetaminophen  (TYLENOL ) 325 MG tablet Take 650 mg by mouth every 6 (six) hours as needed for mild pain. (Patient not taking: Reported on 07/17/2024)   ALPRAZolam  (XANAX ) 0.5 MG tablet Take 1 tablet (0.5 mg total) by mouth daily as needed for anxiety.   donepezil  (ARICEPT ) 5 MG tablet Take 5 mg by mouth at bedtime.   loperamide (IMODIUM A-D) 2 MG tablet Take 2 mg by mouth every 6 (six) hours as needed for diarrhea or loose stools.    No facility-administered encounter medications on file as of 08/04/2024.     SIGNIFICANT DIAGNOSTIC EXAMS  LABS REVIEWED: PREVIOUS:       08-26-23: wbc 5.3; hgb 10.9; hct 34.0; mcv 96.3 plt 288; glucose 79; bun 13; creat 0.85; k+ 3.9; na++ 138; ca 8.4; gfr>60 protein 6.5 albumin 2.7  06-15-24:  wbc 6.1; hgb 10.8; hct 34.2; mcv 94.7 plt 271; glucose 96; bun 20; creat 1.00; k+ 5.2; na++ 138; ca 8.7; gfr 52   NO NEW LABS.     Review of Systems  Constitutional:  Negative for malaise/fatigue.  Respiratory:  Negative for cough and shortness of breath.   Cardiovascular:  Negative for chest pain, palpitations and leg swelling.  Gastrointestinal:  Negative for abdominal pain, constipation and heartburn.  Musculoskeletal:  Negative for back pain, joint pain and myalgias.  Skin: Negative.   Neurological:  Negative for dizziness.  Psychiatric/Behavioral:  The patient is not nervous/anxious.     Physical Exam Constitutional:      General: She is not in acute distress.    Appearance: She is well-developed. She is not diaphoretic.  Neck:     Thyroid : No thyromegaly.  Cardiovascular:     Rate and Rhythm: Normal rate and regular rhythm.     Pulses: Normal pulses.     Heart sounds: Murmur heard.  Pulmonary:     Effort: Pulmonary effort is normal. No respiratory distress.     Breath sounds: Normal breath sounds.  Abdominal:     General: Bowel sounds are normal. There is no distension.     Palpations: Abdomen is soft.     Tenderness: There is no abdominal tenderness.  Musculoskeletal:        General: Normal range of motion.     Cervical back: Neck supple.     Right lower leg: No edema.     Left lower leg: No edema.  Lymphadenopathy:     Cervical: No cervical adenopathy.  Skin:    General: Skin is warm and dry.  Neurological:     Mental Status: She is alert. Mental status is at baseline.  Psychiatric:        Mood and Affect: Mood normal.      ASSESSMENT/  PLAN:  TODAY  Vascular dementia, severe, without behavioral disturbance, psychotic disturbance, mood disturbance, and anxiety/major neurocognitive disorder: weight is 135 pounds is on aricept  5 mg daily  2. Primary hypertension: b/p 136/74  3. Calculus of gall bladder without acute cholecystitis and obstruction: is without pain; her drain remains out.    PREVIOUS   4. Chronic constipation: will monitor   5. Post menopausal osteoporosis: t score -2.646  6. Herpes: without outbreak  7. Protein calorie malnutrition: albumin 3.0 will continue supplements as directed.   8. Chronic anemia: hgb 10.8  9. Aortic atherosclerosis (ct 10-25-18) not on statin due to advanced age    Barnie Seip NP [facility]  call [phone]

## 2024-08-26 ENCOUNTER — Non-Acute Institutional Stay: Payer: Self-pay | Admitting: Internal Medicine

## 2024-08-26 ENCOUNTER — Encounter: Payer: Self-pay | Admitting: Internal Medicine

## 2024-08-26 DIAGNOSIS — F03918 Unspecified dementia, unspecified severity, with other behavioral disturbance: Secondary | ICD-10-CM

## 2024-08-26 DIAGNOSIS — I5031 Acute diastolic (congestive) heart failure: Secondary | ICD-10-CM

## 2024-08-26 DIAGNOSIS — I5030 Unspecified diastolic (congestive) heart failure: Secondary | ICD-10-CM | POA: Insufficient documentation

## 2024-08-26 DIAGNOSIS — D649 Anemia, unspecified: Secondary | ICD-10-CM

## 2024-08-26 NOTE — Progress Notes (Unsigned)
 NURSING HOME LOCATION:  [facility] ROOM NUMBER:  [id number]  CODE STATUS:  DNR  PCP: Landy Barnie RAMAN, NP   This is a nursing facility follow up visit for specific acute issue of progressive edema associated with dyspnea..  Interim medical record and care since last SNF visit was updated with review of diagnostic studies and change in clinical status since last visit were documented.  HPI: The patient denies any significant symptoms at this time despite edema on exam.  Staff says that she will typically refuse interventions or labs.  She did agree to be weighed in August and a weight of 134 pounds was recorded.  In October it was 135.  There was no weight for September.  Current weight as of yesterday was 143 pounds, indicating 8 pound gain over the past month.  She did have labs completed in August.  Potassium was 5.2, calcium 8.7, and GFR 52 with a creatinine 1.00.  Also at that time normochromic, normocytic anemia was present with H/H of 10.8/34.2. The most recent echocardiogram on record was in 2015.  This revealed grade 1 diastolic dysfunction with EF greater than 70%.  It did reveal mild valvular calcifications and also an atrial septal aneurysm without shunt. Last EKG on record was in 2021; this revealed variable right axis deviation and sinus rhythm.  Review of systems: Dementia invalidated responses.  As noted she denied any cardiopulmonary symptoms or even edema even though such as visible.  She had a birthday yesterday but could not tell us  her age and did not realize there had been a celebration.  Constitutional: No fever, significant weight change, fatigue  Eyes: No redness, discharge, pain, vision change ENT/mouth: No nasal congestion,  purulent discharge, earache, change in hearing, sore throat  Cardiovascular: No chest pain, palpitations, paroxysmal nocturnal dyspnea, claudication, edema  Respiratory: No cough, sputum production, hemoptysis, DOE, significant  snoring, apnea   Gastrointestinal: No heartburn, dysphagia, abdominal pain, nausea /vomiting, rectal bleeding, melena, change in bowels Genitourinary: No dysuria, hematuria, pyuria, incontinence, nocturia Musculoskeletal: No joint stiffness, joint swelling, weakness, pain Dermatologic: No rash, pruritus, change in appearance of skin Neurologic: No dizziness, headache, syncope, seizures, numbness, tingling Psychiatric: No significant anxiety, depression, insomnia, anorexia Endocrine: No change in hair/skin/nails, excessive thirst, excessive hunger, excessive urination  Hematologic/lymphatic: No significant bruising, lymphadenopathy, abnormal bleeding Allergy/immunology: No itchy/watery eyes, significant sneezing, urticaria, angioedema  Physical exam:  Pertinent or positive findings: She initially resisted any exam but finally cooperated to some extent with the help of Lyla Mohr ,LPN.  She continued to exhibit sarcasm.  When asked to take a deep breath; she stated that she would blow it on you.  She made a fist and made veiled threats that she would use it on you.   Lids are puffy.  There is malalignment of the mandibular teeth.  She would not take a deep breath but rales were suggested in the right lower lobe.  Heart sounds were distant.  Rate 1 systolic murmur was suggested.  She has 1+ edema of the lower extremities.  Pedal pulses cannot be palpated. General appearance: Adequately nourished; no acute distress, increased work of breathing is present.   Lymphatic: No lymphadenopathy about the head, neck, axilla. Eyes: No conjunctival inflammation or lid edema is present. There is no scleral icterus. Ears:  External ear exam shows no significant lesions or deformities.   Nose:  External nasal examination shows no deformity or inflammation. Nasal mucosa are pink and moist without lesions,  exudates Oral exam:  Lips and gums are healthy appearing. There is no oropharyngeal erythema or  exudate. Neck:  No thyromegaly, masses, tenderness noted.    Heart:  Normal rate and regular rhythm. S1 and S2 normal without gallop, murmur, click, rub .  Lungs: Chest clear to auscultation without wheezes, rhonchi, rales, rubs. Abdomen: Bowel sounds are normal. Abdomen is soft and nontender with no organomegaly, hernias, masses. GU: Deferred  Extremities:  No cyanosis, clubbing, edema  Neurologic exam : Cn 2-7 intact Strength equal  in upper & lower extremities Balance, Rhomberg, finger to nose testing could not be completed due to clinical state Deep tendon reflexes are equal Skin: Warm & dry w/o tenting. No significant lesions or rash.  See summary under each active problem in the Problem List with associated updated therapeutic plan

## 2024-08-26 NOTE — Assessment & Plan Note (Signed)
 She resisted interview on the exam and made veiled threats of personal injury if persistent and the evaluation.  She would not follow commands to take deep breaths.  Despite visible peripheral edema she denied such.

## 2024-08-26 NOTE — Patient Instructions (Signed)
 See assessment and plan under each diagnosis in the problem list and acutely for this visit

## 2024-08-26 NOTE — Assessment & Plan Note (Signed)
 In August normochromic, normocytic anemia was present with H/H of 10.8/34.2.  No bleeding dyscrasias reported.  CBC will be checked to make sure that dyspnea is not related to progressive anemia.

## 2024-08-26 NOTE — Assessment & Plan Note (Addendum)
 She has significant peripheral edema and there is a suggestion of rales in the right lower lobe.  There is been an 8 to 9 pound weight gain since August.  Portable chest x-ray and BNP will be ordered.

## 2024-09-29 ENCOUNTER — Non-Acute Institutional Stay (SKILLED_NURSING_FACILITY): Payer: Self-pay | Admitting: Adult Health

## 2024-09-29 ENCOUNTER — Encounter: Payer: Self-pay | Admitting: Adult Health

## 2024-09-29 NOTE — Progress Notes (Unsigned)
 Location:  [facility] Room Number: 110 Place of Service:  SNF (31)   CODE STATUS: dnr   No Known Allergies  Chief Complaint  Patient presents with   Medical Management of Chronic Issues           ***    HPI:    Past Medical History:  Diagnosis Date   Aortic atherosclerosis 11/15/2020   Breast cancer (HCC)    Cognitive communication deficit    Dementia (HCC)    Difficulty in walking(719.7)    Fall    HTN (hypertension) 08/26/2016   Muscle weakness (generalized)    Rash and nonspecific skin eruption    Thyroid  disease    hypo   Urinary tract infection, site not specified    Vitamin B deficiency     Past Surgical History:  Procedure Laterality Date   COLON SURGERY     IR CATHETER TUBE CHANGE  05/07/2017   IR EXCHANGE BILIARY DRAIN  07/02/2017   IR EXCHANGE BILIARY DRAIN  07/10/2017   IR EXCHANGE BILIARY DRAIN  08/28/2017   IR EXCHANGE BILIARY DRAIN  10/11/2017   IR EXCHANGE BILIARY DRAIN  10/28/2017   IR EXCHANGE BILIARY DRAIN  12/23/2017   IR EXCHANGE BILIARY DRAIN  01/23/2018   IR EXCHANGE BILIARY DRAIN  02/17/2018   IR EXCHANGE BILIARY DRAIN  03/20/2018   IR EXCHANGE BILIARY DRAIN  04/17/2018   IR EXCHANGE BILIARY DRAIN  05/29/2018   IR EXCHANGE BILIARY DRAIN  07/24/2018   IR EXCHANGE BILIARY DRAIN  09/22/2018   IR EXCHANGE BILIARY DRAIN  11/20/2018   IR EXCHANGE BILIARY DRAIN  02/11/2019   IR EXCHANGE BILIARY DRAIN  04/14/2019   IR EXCHANGE BILIARY DRAIN  06/09/2019   IR EXCHANGE BILIARY DRAIN  11/26/2019   IR EXCHANGE BILIARY DRAIN  12/23/2019   IR EXCHANGE BILIARY DRAIN  02/22/2020   IR EXCHANGE BILIARY DRAIN  05/25/2020   IR EXCHANGE BILIARY DRAIN  07/29/2020   IR EXCHANGE BILIARY DRAIN  08/30/2020   IR EXCHANGE BILIARY DRAIN  10/06/2020   IR EXCHANGE BILIARY DRAIN  11/18/2020   IR EXCHANGE BILIARY DRAIN  12/21/2020   IR EXCHANGE BILIARY DRAIN  02/03/2021   IR EXCHANGE BILIARY DRAIN  03/07/2021   IR EXCHANGE BILIARY DRAIN  05/04/2021   IR EXCHANGE BILIARY  DRAIN  06/22/2021   IR EXCHANGE BILIARY DRAIN  07/04/2021   IR EXCHANGE BILIARY DRAIN  08/10/2021   IR PERC CHOLECYSTOSTOMY  01/27/2017   IR PERC CHOLECYSTOSTOMY  11/18/2019   IR RADIOLOGIST EVAL & MGMT  03/05/2017   IR RADIOLOGIST EVAL & MGMT  01/23/2023   IR SINUS/FIST TUBE CHK-NON GI  10/06/2020   MASTECTOMY, PARTIAL Right     Social History   Socioeconomic History   Marital status: Widowed    Spouse name: Not on file   Number of children: Not on file   Years of education: Not on file   Highest education level: Not on file  Occupational History   Occupation: retired  Tobacco Use   Smoking status: Never   Smokeless tobacco: Never  Vaping Use   Vaping status: Never Used  Substance and Sexual Activity   Alcohol use: No   Drug use: No   Sexual activity: Not Currently  Other Topics Concern   Not on file  Social History Narrative   Long term resident of [facility]    Social Drivers of Health   Financial Resource Strain: Low Risk  (07/21/2018)  Overall Financial Resource Strain (CARDIA)    Difficulty of Paying Living Expenses: Not hard at all  Food Insecurity: No Food Insecurity (07/21/2018)   Hunger Vital Sign    Worried About Running Out of Food in the Last Year: Never true    Ran Out of Food in the Last Year: Never true  Transportation Needs: No Transportation Needs (07/21/2018)   PRAPARE - Administrator, Civil Service (Medical): No    Lack of Transportation (Non-Medical): No  Physical Activity: Inactive (07/21/2018)   Exercise Vital Sign    Days of Exercise per Week: 0 days    Minutes of Exercise per Session: 0 min  Stress: No Stress Concern Present (07/21/2018)   [employer] of Occupational Health - Occupational Stress Questionnaire    Feeling of Stress : Not at all  Social Connections: Socially Isolated (07/21/2018)   Social Connection and Isolation Panel    Frequency of Communication with Friends and Family: Never    Frequency of Social Gatherings with  Friends and Family: Never    Attends Religious Services: Never    Database Administrator or Organizations: No    Attends Banker Meetings: Never    Marital Status: Widowed  Intimate Partner Violence: Not At Risk (07/21/2018)   Humiliation, Afraid, Rape, and Kick questionnaire    Fear of Current or Ex-Partner: No    Emotionally Abused: No    Physically Abused: No    Sexually Abused: No   Family History  Problem Relation Age of Onset   Cancer Father        ? primary   Diabetes Neg Hx    Heart disease Neg Hx    Stroke Neg Hx       VITAL SIGNS BP 134/60   Pulse 81   Temp 97.8 F (36.6 C)   Resp 20   Ht 5' 6 (1.676 m)   Wt 128 lb (58.1 kg)   SpO2 98%   BMI 20.66 kg/m   Outpatient Encounter Medications as of 09/29/2024  Medication Sig   acetaminophen  (TYLENOL ) 325 MG tablet Take 650 mg by mouth every 6 (six) hours as needed for mild pain. (Patient not taking: Reported on 07/17/2024)   ALPRAZolam  (XANAX ) 0.5 MG tablet Take 1 tablet (0.5 mg total) by mouth daily as needed for anxiety.   donepezil  (ARICEPT ) 5 MG tablet Take 5 mg by mouth at bedtime.   loperamide (IMODIUM A-D) 2 MG tablet Take 2 mg by mouth every 6 (six) hours as needed for diarrhea or loose stools.   No facility-administered encounter medications on file as of 09/29/2024.     SIGNIFICANT DIAGNOSTIC EXAMS       ASSESSMENT/ PLAN:     Barnie Seip NP [facility]  call [phone]

## 2024-10-09 ENCOUNTER — Non-Acute Institutional Stay (SKILLED_NURSING_FACILITY): Payer: Self-pay | Admitting: Adult Health

## 2024-10-09 ENCOUNTER — Encounter: Payer: Self-pay | Admitting: Adult Health

## 2024-10-09 DIAGNOSIS — I5032 Chronic diastolic (congestive) heart failure: Secondary | ICD-10-CM

## 2024-10-09 DIAGNOSIS — F0390 Unspecified dementia without behavioral disturbance: Secondary | ICD-10-CM | POA: Diagnosis not present

## 2024-10-09 DIAGNOSIS — F03918 Unspecified dementia, unspecified severity, with other behavioral disturbance: Secondary | ICD-10-CM

## 2024-10-09 NOTE — Progress Notes (Signed)
 " Location:  [facility] Room Number: 110 D Place of Service:  SNF (31)   CODE STATUS: DNR  Allergies[1]  Chief Complaint  Patient presents with   Care Plan Meeting    HPI:  We have come together for her care plan meeting. BIMS (declined) mood (declined) she will decline care at times. She has delusions of people in her house trying to make her do things. She has had a decline in her adl care. She requires moderate to dependent assist with her adl care. She does ambulate has had 2 falls without injury. She is frequently incontinent of bladder and bowel. Dietary: mech soft diet appetite 76-100% weight is 128 pounds with a loss of 15 pounds in the past 30 days without significant weight loss in the past 180 days. Therapy: none at this time. Activities: does require assist to participate. Will continue to be followed for her chronic illnesses including:  Chronic diastolic heart failure   Major neurocognitive disorder   Dementia with behavioral disturbance  Past Medical History:  Diagnosis Date   Aortic atherosclerosis 11/15/2020   Breast cancer (HCC)    Cognitive communication deficit    Dementia (HCC)    Difficulty in walking(719.7)    Fall    HTN (hypertension) 08/26/2016   Muscle weakness (generalized)    Rash and nonspecific skin eruption    Thyroid  disease    hypo   Urinary tract infection, site not specified    Vitamin B deficiency     Past Surgical History:  Procedure Laterality Date   COLON SURGERY     IR CATHETER TUBE CHANGE  05/07/2017   IR EXCHANGE BILIARY DRAIN  07/02/2017   IR EXCHANGE BILIARY DRAIN  07/10/2017   IR EXCHANGE BILIARY DRAIN  08/28/2017   IR EXCHANGE BILIARY DRAIN  10/11/2017   IR EXCHANGE BILIARY DRAIN  10/28/2017   IR EXCHANGE BILIARY DRAIN  12/23/2017   IR EXCHANGE BILIARY DRAIN  01/23/2018   IR EXCHANGE BILIARY DRAIN  02/17/2018   IR EXCHANGE BILIARY DRAIN  03/20/2018   IR EXCHANGE BILIARY DRAIN  04/17/2018   IR EXCHANGE BILIARY  DRAIN  05/29/2018   IR EXCHANGE BILIARY DRAIN  07/24/2018   IR EXCHANGE BILIARY DRAIN  09/22/2018   IR EXCHANGE BILIARY DRAIN  11/20/2018   IR EXCHANGE BILIARY DRAIN  02/11/2019   IR EXCHANGE BILIARY DRAIN  04/14/2019   IR EXCHANGE BILIARY DRAIN  06/09/2019   IR EXCHANGE BILIARY DRAIN  11/26/2019   IR EXCHANGE BILIARY DRAIN  12/23/2019   IR EXCHANGE BILIARY DRAIN  02/22/2020   IR EXCHANGE BILIARY DRAIN  05/25/2020   IR EXCHANGE BILIARY DRAIN  07/29/2020   IR EXCHANGE BILIARY DRAIN  08/30/2020   IR EXCHANGE BILIARY DRAIN  10/06/2020   IR EXCHANGE BILIARY DRAIN  11/18/2020   IR EXCHANGE BILIARY DRAIN  12/21/2020   IR EXCHANGE BILIARY DRAIN  02/03/2021   IR EXCHANGE BILIARY DRAIN  03/07/2021   IR EXCHANGE BILIARY DRAIN  05/04/2021   IR EXCHANGE BILIARY DRAIN  06/22/2021   IR EXCHANGE BILIARY DRAIN  07/04/2021   IR EXCHANGE BILIARY DRAIN  08/10/2021   IR PERC CHOLECYSTOSTOMY  01/27/2017   IR PERC CHOLECYSTOSTOMY  11/18/2019   IR RADIOLOGIST EVAL & MGMT  03/05/2017   IR RADIOLOGIST EVAL & MGMT  01/23/2023   IR SINUS/FIST TUBE CHK-NON GI  10/06/2020   MASTECTOMY, PARTIAL Right     Social History   Socioeconomic History   Marital status:  Widowed    Spouse name: Not on file   Number of children: Not on file   Years of education: Not on file   Highest education level: Not on file  Occupational History   Occupation: retired  Tobacco Use   Smoking status: Never   Smokeless tobacco: Never  Vaping Use   Vaping status: Never Used  Substance and Sexual Activity   Alcohol use: No   Drug use: No   Sexual activity: Not Currently  Other Topics Concern   Not on file  Social History Narrative   Long term resident of [facility]    Social Drivers of Health   Tobacco Use: Low Risk (10/09/2024)   Patient History    Smoking Tobacco Use: Never    Smokeless Tobacco Use: Never    Passive Exposure: Not on file  Financial Resource Strain: Not on file  Food Insecurity: Not on file  Transportation Needs: Not on file   Physical Activity: Not on file  Stress: Not on file  Social Connections: Not on file  Intimate Partner Violence: Not on file  Depression (PHQ2-9): Low Risk (07/22/2024)   Depression (PHQ2-9)    PHQ-2 Score: 0  Alcohol Screen: Not on file  Housing: Not on file  Utilities: Not on file  Health Literacy: Not on file   Family History  Problem Relation Age of Onset   Cancer Father        ? primary   Diabetes Neg Hx    Heart disease Neg Hx    Stroke Neg Hx       VITAL SIGNS BP 118/70   Pulse 68   Temp 98.6 F (37 C)   Resp 20   Ht 5' 6 (1.676 m)   Wt 128 lb (58.1 kg)   SpO2 96%   BMI 20.66 kg/m   Outpatient Encounter Medications as of 10/09/2024  Medication Sig   donepezil  (ARICEPT ) 5 MG tablet Take 5 mg by mouth at bedtime.   loperamide (IMODIUM A-D) 2 MG tablet Take 2 mg by mouth every 6 (six) hours as needed for diarrhea or loose stools.   acetaminophen  (TYLENOL ) 325 MG tablet Take 650 mg by mouth every 6 (six) hours as needed for mild pain. (Patient not taking: Reported on 10/09/2024)   ALPRAZolam  (XANAX ) 0.5 MG tablet Take 1 tablet (0.5 mg total) by mouth daily as needed for anxiety. (Patient not taking: Reported on 10/09/2024)   No facility-administered encounter medications on file as of 10/09/2024.     SIGNIFICANT DIAGNOSTIC EXAMS  LABS REVIEWED: PREVIOUS:       06-15-24: wbc 6.1; hgb 10.8; hct 34.2; mcv 94.7 plt 271; glucose 96; bun 20; creat 1.00; k+ 5.2; na++ 138; ca 8.7; gfr 52   NO NEW LABS.    Review of Systems  Constitutional:  Negative for malaise/fatigue.  Respiratory:  Negative for cough and shortness of breath.   Cardiovascular:  Negative for chest pain, palpitations and leg swelling.  Gastrointestinal:  Negative for abdominal pain, constipation and heartburn.  Musculoskeletal:  Negative for back pain, joint pain and myalgias.  Skin: Negative.   Neurological:  Negative for dizziness.  Psychiatric/Behavioral:  The patient is not  nervous/anxious.    Physical Exam Constitutional:      General: She is not in acute distress.    Appearance: She is well-developed. She is not diaphoretic.  Neck:     Thyroid : No thyromegaly.  Cardiovascular:     Rate and Rhythm: Normal rate and regular rhythm.  Heart sounds: Murmur heard.  Pulmonary:     Effort: Pulmonary effort is normal. No respiratory distress.     Breath sounds: Normal breath sounds.  Abdominal:     General: Bowel sounds are normal. There is no distension.     Palpations: Abdomen is soft.     Tenderness: There is no abdominal tenderness.  Musculoskeletal:        General: Normal range of motion.     Cervical back: Neck supple.     Right lower leg: No edema.     Left lower leg: No edema.  Lymphadenopathy:     Cervical: No cervical adenopathy.  Skin:    General: Skin is warm and dry.  Neurological:     Mental Status: She is alert. Mental status is at baseline.  Psychiatric:        Mood and Affect: Mood normal.      ASSESSMENT/ PLAN:  TODAY  Chronic diastolic heart failure Major neurocognitive disorder Dementia with behavioral disturbance   Will stop her aricept  due to her weight loss Will continue current plan of care Will continue to monitor her status  Time spent with patient: 40 minutes: medications; weight loss; plan of care.    Barnie Seip NP [facility]   call [phone]      [1] No Known Allergies  "

## 2024-10-27 ENCOUNTER — Non-Acute Institutional Stay (SKILLED_NURSING_FACILITY): Admitting: Adult Health

## 2024-10-27 ENCOUNTER — Encounter: Payer: Self-pay | Admitting: Adult Health

## 2024-10-27 DIAGNOSIS — D649 Anemia, unspecified: Secondary | ICD-10-CM

## 2024-10-27 DIAGNOSIS — E441 Mild protein-calorie malnutrition: Secondary | ICD-10-CM

## 2024-10-27 DIAGNOSIS — I700 Atherosclerosis of aorta: Secondary | ICD-10-CM

## 2024-10-27 NOTE — Progress Notes (Signed)
 " Location:  [facility] Room Number: 110 Place of Service:  SNF (31)   CODE STATUS: dnr   Allergies[1]  Chief Complaint  Patient presents with   Medical Management of Chronic Issues           Protein calorie malnutrition: Chronic anemia: Aortic atherosclerosis    HPI:  She is a 89 y.o. long term resident of this facility being seen for the management of her chronic illnesses:Protein calorie malnutrition: Chronic anemia: Aortic atherosclerosis. There are no reports of uncontrolled pain. She does not take medications on a regular basis. She remains ambulatory    Past Medical History:  Diagnosis Date   Aortic atherosclerosis 11/15/2020   Breast cancer (HCC)    Cognitive communication deficit    Dementia (HCC)    Difficulty in walking(719.7)    Fall    HTN (hypertension) 08/26/2016   Muscle weakness (generalized)    Rash and nonspecific skin eruption    Thyroid  disease    hypo   Urinary tract infection, site not specified    Vitamin B deficiency     Past Surgical History:  Procedure Laterality Date   COLON SURGERY     IR CATHETER TUBE CHANGE  05/07/2017   IR EXCHANGE BILIARY DRAIN  07/02/2017   IR EXCHANGE BILIARY DRAIN  07/10/2017   IR EXCHANGE BILIARY DRAIN  08/28/2017   IR EXCHANGE BILIARY DRAIN  10/11/2017   IR EXCHANGE BILIARY DRAIN  10/28/2017   IR EXCHANGE BILIARY DRAIN  12/23/2017   IR EXCHANGE BILIARY DRAIN  01/23/2018   IR EXCHANGE BILIARY DRAIN  02/17/2018   IR EXCHANGE BILIARY DRAIN  03/20/2018   IR EXCHANGE BILIARY DRAIN  04/17/2018   IR EXCHANGE BILIARY DRAIN  05/29/2018   IR EXCHANGE BILIARY DRAIN  07/24/2018   IR EXCHANGE BILIARY DRAIN  09/22/2018   IR EXCHANGE BILIARY DRAIN  11/20/2018   IR EXCHANGE BILIARY DRAIN  02/11/2019   IR EXCHANGE BILIARY DRAIN  04/14/2019   IR EXCHANGE BILIARY DRAIN  06/09/2019   IR EXCHANGE BILIARY DRAIN  11/26/2019   IR EXCHANGE BILIARY DRAIN  12/23/2019   IR EXCHANGE BILIARY DRAIN  02/22/2020   IR EXCHANGE BILIARY DRAIN   05/25/2020   IR EXCHANGE BILIARY DRAIN  07/29/2020   IR EXCHANGE BILIARY DRAIN  08/30/2020   IR EXCHANGE BILIARY DRAIN  10/06/2020   IR EXCHANGE BILIARY DRAIN  11/18/2020   IR EXCHANGE BILIARY DRAIN  12/21/2020   IR EXCHANGE BILIARY DRAIN  02/03/2021   IR EXCHANGE BILIARY DRAIN  03/07/2021   IR EXCHANGE BILIARY DRAIN  05/04/2021   IR EXCHANGE BILIARY DRAIN  06/22/2021   IR EXCHANGE BILIARY DRAIN  07/04/2021   IR EXCHANGE BILIARY DRAIN  08/10/2021   IR PERC CHOLECYSTOSTOMY  01/27/2017   IR PERC CHOLECYSTOSTOMY  11/18/2019   IR RADIOLOGIST EVAL & MGMT  03/05/2017   IR RADIOLOGIST EVAL & MGMT  01/23/2023   IR SINUS/FIST TUBE CHK-NON GI  10/06/2020   MASTECTOMY, PARTIAL Right     Social History   Socioeconomic History   Marital status: Widowed    Spouse name: Not on file   Number of children: Not on file   Years of education: Not on file   Highest education level: Not on file  Occupational History   Occupation: retired  Tobacco Use   Smoking status: Never   Smokeless tobacco: Never  Vaping Use   Vaping status: Never Used  Substance and Sexual Activity   Alcohol  use: No   Drug use: No   Sexual activity: Not Currently  Other Topics Concern   Not on file  Social History Narrative   Long term resident of [facility]    Social Drivers of Health   Tobacco Use: Low Risk (10/27/2024)   Patient History    Smoking Tobacco Use: Never    Smokeless Tobacco Use: Never    Passive Exposure: Not on file  Financial Resource Strain: Not on file  Food Insecurity: Not on file  Transportation Needs: Not on file  Physical Activity: Not on file  Stress: Not on file  Social Connections: Not on file  Intimate Partner Violence: Not on file  Depression (PHQ2-9): Low Risk (07/22/2024)   Depression (PHQ2-9)    PHQ-2 Score: 0  Alcohol Screen: Not on file  Housing: Not on file  Utilities: Not on file  Health Literacy: Not on file   Family History  Problem Relation Age of Onset   Cancer Father        ? primary    Diabetes Neg Hx    Heart disease Neg Hx    Stroke Neg Hx       VITAL SIGNS BP 135/63   Pulse (!) 57   Temp (!) 97.2 F (36.2 C)   Resp 20   Ht 5' 6 (1.676 m)   Wt 128 lb (58.1 kg)   SpO2 96%   BMI 20.66 kg/m   Outpatient Encounter Medications as of 10/27/2024  Medication Sig   loperamide (IMODIUM A-D) 2 MG tablet Take 2 mg by mouth every 6 (six) hours as needed for diarrhea or loose stools.   No facility-administered encounter medications on file as of 10/27/2024.     SIGNIFICANT DIAGNOSTIC EXAMS  LABS REVIEWED: PREVIOUS:       06-15-24: wbc 6.1; hgb 10.8; hct 34.2; mcv 94.7 plt 271; glucose 96; bun 20; creat 1.00; k+ 5.2; na++ 138; ca 8.7; gfr 52   NO NEW LABS.    Review of Systems  Constitutional:  Negative for malaise/fatigue.  Respiratory:  Negative for cough and shortness of breath.   Cardiovascular:  Negative for chest pain, palpitations and leg swelling.  Gastrointestinal:  Negative for abdominal pain, constipation and heartburn.  Musculoskeletal:  Negative for back pain, joint pain and myalgias.  Skin: Negative.   Neurological:  Negative for dizziness.  Psychiatric/Behavioral:  The patient is not nervous/anxious.      Physical Exam Constitutional:      General: She is not in acute distress.    Appearance: She is well-developed. She is not diaphoretic.  Neck:     Thyroid : No thyromegaly.  Cardiovascular:     Rate and Rhythm: Normal rate and regular rhythm.     Pulses: Normal pulses.     Heart sounds: Murmur heard.  Pulmonary:     Effort: Pulmonary effort is normal. No respiratory distress.     Breath sounds: Normal breath sounds.  Abdominal:     General: Bowel sounds are normal. There is no distension.     Palpations: Abdomen is soft.     Tenderness: There is no abdominal tenderness.  Musculoskeletal:        General: Normal range of motion.     Cervical back: Neck supple.     Right lower leg: No edema.     Left lower leg: No edema.   Lymphadenopathy:     Cervical: No cervical adenopathy.  Skin:    General: Skin is warm and dry.  Neurological:     Mental Status: She is alert. Mental status is at baseline.  Psychiatric:        Mood and Affect: Mood normal.        ASSESSMENT/ PLAN:  TODAY  Protein calorie malnutrition: albumin 3.0 will continue supplements as directed   2. Chronic anemia: hgb 10.8  3. Aortic atherosclerosis (ct 10-25-18) not on statin due to advanced age.   PREVIOUS   4. Vascular dementia, severe, without behavioral disturbance, psychotic disturbance, mood disturbance, and anxiety/major neurocognitive disorder: weight is 128 pounds is no longer on aricept    5. Primary hypertension: b/p 135/63  6. Calculus of gall bladder without acute cholecystitis and obstruction: is without pain; her drain remains out.    7. Chronic constipation: will monitor   8. Post menopausal osteoporosis: t score -2.646  9. Herpes: without outbreak.     Barnie Seip NP [facility]   call [phone]      [1] No Known Allergies  "
